# Patient Record
Sex: Female | Born: 1955 | Race: Black or African American | Hispanic: No | Marital: Married | Smoking: Former smoker
Health system: Southern US, Community
[De-identification: ages and names within clinical notes are randomized; demographics above are authoritative.]

## PROBLEM LIST (undated history)

## (undated) DIAGNOSIS — G40909 Epilepsy, unspecified, not intractable, without status epilepticus: Secondary | ICD-10-CM

## (undated) DIAGNOSIS — E669 Obesity, unspecified: Secondary | ICD-10-CM

## (undated) DIAGNOSIS — I10 Essential (primary) hypertension: Secondary | ICD-10-CM

## (undated) DIAGNOSIS — N269 Renal sclerosis, unspecified: Secondary | ICD-10-CM

## (undated) DIAGNOSIS — R7303 Prediabetes: Secondary | ICD-10-CM

## (undated) DIAGNOSIS — F329 Major depressive disorder, single episode, unspecified: Secondary | ICD-10-CM

## (undated) DIAGNOSIS — Z720 Tobacco use: Secondary | ICD-10-CM

## (undated) DIAGNOSIS — M2550 Pain in unspecified joint: Secondary | ICD-10-CM

## (undated) DIAGNOSIS — M1990 Unspecified osteoarthritis, unspecified site: Secondary | ICD-10-CM

## (undated) DIAGNOSIS — K219 Gastro-esophageal reflux disease without esophagitis: Secondary | ICD-10-CM

## (undated) DIAGNOSIS — F419 Anxiety disorder, unspecified: Secondary | ICD-10-CM

## (undated) DIAGNOSIS — K589 Irritable bowel syndrome without diarrhea: Secondary | ICD-10-CM

## (undated) DIAGNOSIS — D649 Anemia, unspecified: Secondary | ICD-10-CM

## (undated) DIAGNOSIS — F450 Somatization disorder: Secondary | ICD-10-CM

## (undated) DIAGNOSIS — R569 Unspecified convulsions: Secondary | ICD-10-CM

## (undated) DIAGNOSIS — R059 Cough, unspecified: Secondary | ICD-10-CM

## (undated) DIAGNOSIS — R002 Palpitations: Secondary | ICD-10-CM

## (undated) DIAGNOSIS — F32A Depression, unspecified: Secondary | ICD-10-CM

## (undated) DIAGNOSIS — J40 Bronchitis, not specified as acute or chronic: Secondary | ICD-10-CM

## (undated) DIAGNOSIS — K5900 Constipation, unspecified: Secondary | ICD-10-CM

## (undated) DIAGNOSIS — G8929 Other chronic pain: Secondary | ICD-10-CM

## (undated) DIAGNOSIS — E119 Type 2 diabetes mellitus without complications: Secondary | ICD-10-CM

## (undated) DIAGNOSIS — M069 Rheumatoid arthritis, unspecified: Secondary | ICD-10-CM

## (undated) DIAGNOSIS — T7840XA Allergy, unspecified, initial encounter: Secondary | ICD-10-CM

## (undated) DIAGNOSIS — R05 Cough: Secondary | ICD-10-CM

## (undated) DIAGNOSIS — E785 Hyperlipidemia, unspecified: Secondary | ICD-10-CM

## (undated) DIAGNOSIS — J189 Pneumonia, unspecified organism: Secondary | ICD-10-CM

## (undated) DIAGNOSIS — M549 Dorsalgia, unspecified: Secondary | ICD-10-CM

## (undated) HISTORY — DX: Tobacco use: Z72.0

## (undated) HISTORY — DX: Rheumatoid arthritis, unspecified: M06.9

## (undated) HISTORY — PX: TONSILLECTOMY: SUR1361

## (undated) HISTORY — DX: Gastro-esophageal reflux disease without esophagitis: K21.9

## (undated) HISTORY — PX: BREAST EXCISIONAL BIOPSY: SUR124

## (undated) HISTORY — DX: Dorsalgia, unspecified: M54.9

## (undated) HISTORY — DX: Obesity, unspecified: E66.9

## (undated) HISTORY — DX: Pain in unspecified joint: M25.50

## (undated) HISTORY — DX: Prediabetes: R73.03

## (undated) HISTORY — DX: Anxiety disorder, unspecified: F41.9

## (undated) HISTORY — DX: Palpitations: R00.2

## (undated) HISTORY — DX: Unspecified convulsions: R56.9

## (undated) HISTORY — DX: Somatization disorder: F45.0

## (undated) HISTORY — PX: TUBAL LIGATION: SHX77

## (undated) HISTORY — DX: Constipation, unspecified: K59.00

## (undated) HISTORY — DX: Unspecified osteoarthritis, unspecified site: M19.90

## (undated) HISTORY — DX: Epilepsy, unspecified, not intractable, without status epilepticus: G40.909

## (undated) HISTORY — DX: Hyperlipidemia, unspecified: E78.5

## (undated) HISTORY — DX: Allergy, unspecified, initial encounter: T78.40XA

## (undated) HISTORY — DX: Renal sclerosis, unspecified: N26.9

## (undated) HISTORY — DX: Other chronic pain: G89.29

## (undated) HISTORY — DX: Anemia, unspecified: D64.9

## (undated) HISTORY — DX: Major depressive disorder, single episode, unspecified: F32.9

## (undated) HISTORY — PX: ABDOMINAL HYSTERECTOMY: SHX81

## (undated) HISTORY — DX: Depression, unspecified: F32.A

## (undated) HISTORY — PX: COLONOSCOPY: SHX174

## (undated) HISTORY — DX: Essential (primary) hypertension: I10

## (undated) HISTORY — PX: CHOLECYSTECTOMY: SHX55

---
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
## 1997-10-02 ENCOUNTER — Encounter: Admission: RE | Admit: 1997-10-02 | Discharge: 1997-10-02 | Payer: Self-pay | Admitting: Family Medicine

## 1997-10-09 ENCOUNTER — Encounter: Admission: RE | Admit: 1997-10-09 | Discharge: 1997-10-09 | Payer: Self-pay | Admitting: Family Medicine

## 1997-10-23 ENCOUNTER — Encounter: Admission: RE | Admit: 1997-10-23 | Discharge: 1997-10-23 | Payer: Self-pay | Admitting: Family Medicine

## 1997-11-13 ENCOUNTER — Encounter: Admission: RE | Admit: 1997-11-13 | Discharge: 1997-11-13 | Payer: Self-pay | Admitting: Family Medicine

## 1997-11-14 ENCOUNTER — Encounter: Admission: RE | Admit: 1997-11-14 | Discharge: 1997-11-14 | Payer: Self-pay | Admitting: Family Medicine

## 1997-12-02 ENCOUNTER — Ambulatory Visit (HOSPITAL_COMMUNITY): Admission: RE | Admit: 1997-12-02 | Discharge: 1997-12-02 | Payer: Self-pay | Admitting: Family Medicine

## 1997-12-31 ENCOUNTER — Encounter: Admission: RE | Admit: 1997-12-31 | Discharge: 1997-12-31 | Payer: Self-pay | Admitting: Family Medicine

## 1998-01-02 ENCOUNTER — Ambulatory Visit (HOSPITAL_COMMUNITY): Admission: RE | Admit: 1998-01-02 | Discharge: 1998-01-02 | Payer: Self-pay | Admitting: Neurology

## 1998-01-15 ENCOUNTER — Encounter: Admission: RE | Admit: 1998-01-15 | Discharge: 1998-01-15 | Payer: Self-pay | Admitting: Family Medicine

## 1998-01-20 ENCOUNTER — Encounter: Admission: RE | Admit: 1998-01-20 | Discharge: 1998-01-20 | Payer: Self-pay | Admitting: Family Medicine

## 1998-02-05 ENCOUNTER — Encounter: Admission: RE | Admit: 1998-02-05 | Discharge: 1998-02-05 | Payer: Self-pay | Admitting: Family Medicine

## 1998-02-12 ENCOUNTER — Encounter: Admission: RE | Admit: 1998-02-12 | Discharge: 1998-02-12 | Payer: Self-pay | Admitting: Family Medicine

## 1998-02-17 ENCOUNTER — Encounter: Admission: RE | Admit: 1998-02-17 | Discharge: 1998-02-17 | Payer: Self-pay | Admitting: Sports Medicine

## 1998-04-06 ENCOUNTER — Encounter: Admission: RE | Admit: 1998-04-06 | Discharge: 1998-04-06 | Payer: Self-pay | Admitting: Family Medicine

## 1998-04-16 ENCOUNTER — Encounter: Admission: RE | Admit: 1998-04-16 | Discharge: 1998-04-16 | Payer: Self-pay | Admitting: Family Medicine

## 1998-05-26 ENCOUNTER — Encounter: Admission: RE | Admit: 1998-05-26 | Discharge: 1998-05-26 | Payer: Self-pay | Admitting: Family Medicine

## 1998-06-11 ENCOUNTER — Encounter: Admission: RE | Admit: 1998-06-11 | Discharge: 1998-06-11 | Payer: Self-pay | Admitting: Family Medicine

## 1998-06-19 ENCOUNTER — Other Ambulatory Visit: Admission: RE | Admit: 1998-06-19 | Discharge: 1998-06-19 | Payer: Self-pay | Admitting: Family Medicine

## 1998-06-19 ENCOUNTER — Encounter: Admission: RE | Admit: 1998-06-19 | Discharge: 1998-06-19 | Payer: Self-pay | Admitting: Family Medicine

## 1998-07-17 ENCOUNTER — Encounter: Admission: RE | Admit: 1998-07-17 | Discharge: 1998-07-17 | Payer: Self-pay | Admitting: Family Medicine

## 1998-08-27 ENCOUNTER — Encounter: Admission: RE | Admit: 1998-08-27 | Discharge: 1998-08-27 | Payer: Self-pay | Admitting: Family Medicine

## 1998-09-03 ENCOUNTER — Encounter: Admission: RE | Admit: 1998-09-03 | Discharge: 1998-09-03 | Payer: Self-pay | Admitting: Family Medicine

## 1998-09-10 ENCOUNTER — Encounter: Admission: RE | Admit: 1998-09-10 | Discharge: 1998-09-10 | Payer: Self-pay | Admitting: Family Medicine

## 1998-09-22 ENCOUNTER — Encounter: Admission: RE | Admit: 1998-09-22 | Discharge: 1998-09-22 | Payer: Self-pay | Admitting: Sports Medicine

## 1998-10-15 ENCOUNTER — Encounter: Admission: RE | Admit: 1998-10-15 | Discharge: 1998-10-15 | Payer: Self-pay | Admitting: Family Medicine

## 1998-10-21 ENCOUNTER — Encounter: Admission: RE | Admit: 1998-10-21 | Discharge: 1998-10-21 | Payer: Self-pay | Admitting: Family Medicine

## 1998-11-12 ENCOUNTER — Encounter: Admission: RE | Admit: 1998-11-12 | Discharge: 1998-11-12 | Payer: Self-pay | Admitting: Family Medicine

## 1998-11-16 ENCOUNTER — Encounter: Admission: RE | Admit: 1998-11-16 | Discharge: 1998-11-16 | Payer: Self-pay | Admitting: Family Medicine

## 1998-11-26 ENCOUNTER — Encounter: Admission: RE | Admit: 1998-11-26 | Discharge: 1998-11-26 | Payer: Self-pay | Admitting: Family Medicine

## 1998-12-09 ENCOUNTER — Encounter: Admission: RE | Admit: 1998-12-09 | Discharge: 1998-12-09 | Payer: Self-pay | Admitting: Family Medicine

## 1998-12-10 ENCOUNTER — Encounter: Admission: RE | Admit: 1998-12-10 | Discharge: 1998-12-10 | Payer: Self-pay | Admitting: Family Medicine

## 1998-12-11 ENCOUNTER — Encounter: Admission: RE | Admit: 1998-12-11 | Discharge: 1998-12-11 | Payer: Self-pay | Admitting: Family Medicine

## 1998-12-14 ENCOUNTER — Encounter: Admission: RE | Admit: 1998-12-14 | Discharge: 1998-12-14 | Payer: Self-pay | Admitting: Family Medicine

## 1998-12-16 ENCOUNTER — Encounter: Admission: RE | Admit: 1998-12-16 | Discharge: 1998-12-16 | Payer: Self-pay | Admitting: Family Medicine

## 1998-12-21 ENCOUNTER — Encounter: Admission: RE | Admit: 1998-12-21 | Discharge: 1998-12-21 | Payer: Self-pay | Admitting: Family Medicine

## 1998-12-23 ENCOUNTER — Encounter: Admission: RE | Admit: 1998-12-23 | Discharge: 1998-12-23 | Payer: Self-pay | Admitting: Family Medicine

## 1998-12-28 ENCOUNTER — Encounter: Admission: RE | Admit: 1998-12-28 | Discharge: 1998-12-28 | Payer: Self-pay | Admitting: Family Medicine

## 1999-01-08 ENCOUNTER — Encounter: Admission: RE | Admit: 1999-01-08 | Discharge: 1999-01-08 | Payer: Self-pay | Admitting: Family Medicine

## 1999-02-23 ENCOUNTER — Encounter: Admission: RE | Admit: 1999-02-23 | Discharge: 1999-02-23 | Payer: Self-pay | Admitting: Family Medicine

## 1999-03-02 ENCOUNTER — Encounter: Admission: RE | Admit: 1999-03-02 | Discharge: 1999-03-02 | Payer: Self-pay | Admitting: Family Medicine

## 1999-03-09 ENCOUNTER — Encounter: Admission: RE | Admit: 1999-03-09 | Discharge: 1999-03-09 | Payer: Self-pay | Admitting: Family Medicine

## 1999-03-15 ENCOUNTER — Encounter: Admission: RE | Admit: 1999-03-15 | Discharge: 1999-03-15 | Payer: Self-pay | Admitting: Family Medicine

## 1999-03-23 ENCOUNTER — Encounter: Admission: RE | Admit: 1999-03-23 | Discharge: 1999-03-23 | Payer: Self-pay | Admitting: Family Medicine

## 1999-03-27 ENCOUNTER — Emergency Department (HOSPITAL_COMMUNITY): Admission: EM | Admit: 1999-03-27 | Discharge: 1999-03-27 | Payer: Self-pay | Admitting: Emergency Medicine

## 1999-03-30 ENCOUNTER — Encounter: Admission: RE | Admit: 1999-03-30 | Discharge: 1999-03-30 | Payer: Self-pay | Admitting: Family Medicine

## 1999-04-22 ENCOUNTER — Encounter: Admission: RE | Admit: 1999-04-22 | Discharge: 1999-04-22 | Payer: Self-pay | Admitting: Family Medicine

## 1999-04-22 ENCOUNTER — Other Ambulatory Visit: Admission: RE | Admit: 1999-04-22 | Discharge: 1999-04-22 | Payer: Self-pay | Admitting: Obstetrics & Gynecology

## 1999-04-26 ENCOUNTER — Encounter: Admission: RE | Admit: 1999-04-26 | Discharge: 1999-04-26 | Payer: Self-pay | Admitting: Family Medicine

## 1999-05-05 ENCOUNTER — Encounter: Admission: RE | Admit: 1999-05-05 | Discharge: 1999-05-05 | Payer: Self-pay | Admitting: Family Medicine

## 1999-05-19 ENCOUNTER — Encounter: Admission: RE | Admit: 1999-05-19 | Discharge: 1999-05-19 | Payer: Self-pay | Admitting: Family Medicine

## 1999-05-28 ENCOUNTER — Encounter: Payer: Self-pay | Admitting: Sports Medicine

## 1999-05-28 ENCOUNTER — Encounter: Admission: RE | Admit: 1999-05-28 | Discharge: 1999-05-28 | Payer: Self-pay | Admitting: Sports Medicine

## 1999-08-03 ENCOUNTER — Encounter (INDEPENDENT_AMBULATORY_CARE_PROVIDER_SITE_OTHER): Payer: Self-pay | Admitting: *Deleted

## 1999-08-03 ENCOUNTER — Ambulatory Visit (HOSPITAL_COMMUNITY): Admission: RE | Admit: 1999-08-03 | Discharge: 1999-08-03 | Payer: Self-pay | Admitting: Gastroenterology

## 1999-09-07 ENCOUNTER — Encounter: Admission: RE | Admit: 1999-09-07 | Discharge: 1999-09-07 | Payer: Self-pay | Admitting: Sports Medicine

## 1999-11-08 ENCOUNTER — Encounter: Admission: RE | Admit: 1999-11-08 | Discharge: 1999-11-08 | Payer: Self-pay | Admitting: Family Medicine

## 2000-01-03 ENCOUNTER — Encounter: Admission: RE | Admit: 2000-01-03 | Discharge: 2000-01-03 | Payer: Self-pay | Admitting: Family Medicine

## 2000-01-10 ENCOUNTER — Encounter: Admission: RE | Admit: 2000-01-10 | Discharge: 2000-01-10 | Payer: Self-pay | Admitting: Family Medicine

## 2000-02-08 ENCOUNTER — Encounter: Admission: RE | Admit: 2000-02-08 | Discharge: 2000-02-08 | Payer: Self-pay | Admitting: Family Medicine

## 2000-02-10 ENCOUNTER — Encounter: Payer: Self-pay | Admitting: Gastroenterology

## 2000-02-10 ENCOUNTER — Ambulatory Visit (HOSPITAL_COMMUNITY): Admission: RE | Admit: 2000-02-10 | Discharge: 2000-02-10 | Payer: Self-pay | Admitting: Gastroenterology

## 2000-02-28 ENCOUNTER — Encounter: Admission: RE | Admit: 2000-02-28 | Discharge: 2000-02-28 | Payer: Self-pay | Admitting: Sports Medicine

## 2000-03-09 ENCOUNTER — Encounter: Admission: RE | Admit: 2000-03-09 | Discharge: 2000-03-09 | Payer: Self-pay | Admitting: Family Medicine

## 2000-03-21 ENCOUNTER — Encounter: Admission: RE | Admit: 2000-03-21 | Discharge: 2000-03-21 | Payer: Self-pay | Admitting: Family Medicine

## 2000-03-24 ENCOUNTER — Encounter: Payer: Self-pay | Admitting: Gastroenterology

## 2000-03-24 ENCOUNTER — Ambulatory Visit (HOSPITAL_COMMUNITY): Admission: RE | Admit: 2000-03-24 | Discharge: 2000-03-24 | Payer: Self-pay | Admitting: Gastroenterology

## 2000-05-08 ENCOUNTER — Encounter: Admission: RE | Admit: 2000-05-08 | Discharge: 2000-05-08 | Payer: Self-pay | Admitting: Family Medicine

## 2000-05-17 ENCOUNTER — Encounter: Admission: RE | Admit: 2000-05-17 | Discharge: 2000-05-17 | Payer: Self-pay | Admitting: Family Medicine

## 2000-05-29 ENCOUNTER — Encounter: Admission: RE | Admit: 2000-05-29 | Discharge: 2000-05-29 | Payer: Self-pay | Admitting: *Deleted

## 2000-05-29 ENCOUNTER — Encounter: Admission: RE | Admit: 2000-05-29 | Discharge: 2000-05-29 | Payer: Self-pay | Admitting: Family Medicine

## 2000-05-30 ENCOUNTER — Encounter: Admission: RE | Admit: 2000-05-30 | Discharge: 2000-05-30 | Payer: Self-pay | Admitting: Family Medicine

## 2000-05-30 ENCOUNTER — Other Ambulatory Visit: Admission: RE | Admit: 2000-05-30 | Discharge: 2000-05-30 | Payer: Self-pay | Admitting: Family Medicine

## 2000-05-31 ENCOUNTER — Encounter: Payer: Self-pay | Admitting: Emergency Medicine

## 2000-05-31 ENCOUNTER — Inpatient Hospital Stay (HOSPITAL_COMMUNITY): Admission: EM | Admit: 2000-05-31 | Discharge: 2000-06-01 | Payer: Self-pay | Admitting: Emergency Medicine

## 2000-06-21 ENCOUNTER — Encounter: Admission: RE | Admit: 2000-06-21 | Discharge: 2000-06-21 | Payer: Self-pay | Admitting: Family Medicine

## 2000-06-29 ENCOUNTER — Encounter: Admission: RE | Admit: 2000-06-29 | Discharge: 2000-06-29 | Payer: Self-pay | Admitting: Family Medicine

## 2000-07-04 HISTORY — PX: OTHER SURGICAL HISTORY: SHX169

## 2000-07-12 ENCOUNTER — Encounter: Admission: RE | Admit: 2000-07-12 | Discharge: 2000-07-12 | Payer: Self-pay | Admitting: Family Medicine

## 2000-08-08 ENCOUNTER — Encounter: Admission: RE | Admit: 2000-08-08 | Discharge: 2000-08-08 | Payer: Self-pay | Admitting: Sports Medicine

## 2000-08-22 ENCOUNTER — Encounter: Admission: RE | Admit: 2000-08-22 | Discharge: 2000-08-22 | Payer: Self-pay | Admitting: Sports Medicine

## 2000-09-05 ENCOUNTER — Encounter: Admission: RE | Admit: 2000-09-05 | Discharge: 2000-09-05 | Payer: Self-pay | Admitting: Family Medicine

## 2000-09-20 ENCOUNTER — Encounter: Admission: RE | Admit: 2000-09-20 | Discharge: 2000-09-20 | Payer: Self-pay | Admitting: Family Medicine

## 2000-11-30 ENCOUNTER — Encounter: Admission: RE | Admit: 2000-11-30 | Discharge: 2000-11-30 | Payer: Self-pay | Admitting: Family Medicine

## 2000-12-08 ENCOUNTER — Encounter: Admission: RE | Admit: 2000-12-08 | Discharge: 2000-12-08 | Payer: Self-pay | Admitting: Family Medicine

## 2001-01-09 ENCOUNTER — Encounter: Admission: RE | Admit: 2001-01-09 | Discharge: 2001-01-09 | Payer: Self-pay | Admitting: Sports Medicine

## 2001-01-30 ENCOUNTER — Encounter: Admission: RE | Admit: 2001-01-30 | Discharge: 2001-01-30 | Payer: Self-pay | Admitting: Family Medicine

## 2001-03-26 ENCOUNTER — Encounter: Admission: RE | Admit: 2001-03-26 | Discharge: 2001-03-26 | Payer: Self-pay | Admitting: *Deleted

## 2001-05-10 ENCOUNTER — Encounter: Admission: RE | Admit: 2001-05-10 | Discharge: 2001-05-10 | Payer: Self-pay | Admitting: Family Medicine

## 2001-06-04 ENCOUNTER — Encounter: Admission: RE | Admit: 2001-06-04 | Discharge: 2001-06-04 | Payer: Self-pay | Admitting: Internal Medicine

## 2001-06-04 ENCOUNTER — Encounter: Payer: Self-pay | Admitting: Internal Medicine

## 2001-06-14 ENCOUNTER — Ambulatory Visit (HOSPITAL_COMMUNITY): Admission: RE | Admit: 2001-06-14 | Discharge: 2001-06-14 | Payer: Self-pay | Admitting: Internal Medicine

## 2001-06-14 ENCOUNTER — Encounter: Payer: Self-pay | Admitting: Internal Medicine

## 2001-09-03 ENCOUNTER — Encounter: Payer: Self-pay | Admitting: Urology

## 2001-09-03 ENCOUNTER — Encounter: Admission: RE | Admit: 2001-09-03 | Discharge: 2001-09-03 | Payer: Self-pay | Admitting: Urology

## 2001-09-11 ENCOUNTER — Encounter: Admission: RE | Admit: 2001-09-11 | Discharge: 2001-09-11 | Payer: Self-pay | Admitting: Urology

## 2001-09-11 ENCOUNTER — Encounter: Payer: Self-pay | Admitting: Urology

## 2001-09-26 ENCOUNTER — Other Ambulatory Visit: Admission: RE | Admit: 2001-09-26 | Discharge: 2001-09-26 | Payer: Self-pay | Admitting: Obstetrics and Gynecology

## 2001-11-07 ENCOUNTER — Encounter (INDEPENDENT_AMBULATORY_CARE_PROVIDER_SITE_OTHER): Payer: Self-pay | Admitting: Specialist

## 2001-11-07 ENCOUNTER — Encounter: Payer: Self-pay | Admitting: Urology

## 2001-11-07 ENCOUNTER — Inpatient Hospital Stay (HOSPITAL_COMMUNITY): Admission: RE | Admit: 2001-11-07 | Discharge: 2001-11-10 | Payer: Self-pay | Admitting: Obstetrics and Gynecology

## 2002-02-05 ENCOUNTER — Encounter: Payer: Self-pay | Admitting: Urology

## 2002-02-05 ENCOUNTER — Encounter: Admission: RE | Admit: 2002-02-05 | Discharge: 2002-02-05 | Payer: Self-pay | Admitting: Urology

## 2002-02-08 ENCOUNTER — Ambulatory Visit (HOSPITAL_BASED_OUTPATIENT_CLINIC_OR_DEPARTMENT_OTHER): Admission: RE | Admit: 2002-02-08 | Discharge: 2002-02-08 | Payer: Self-pay | Admitting: Urology

## 2002-03-04 ENCOUNTER — Encounter (INDEPENDENT_AMBULATORY_CARE_PROVIDER_SITE_OTHER): Payer: Self-pay | Admitting: *Deleted

## 2002-03-04 LAB — CONVERTED CEMR LAB

## 2002-03-19 ENCOUNTER — Encounter: Admission: RE | Admit: 2002-03-19 | Discharge: 2002-03-19 | Payer: Self-pay | Admitting: Family Medicine

## 2002-03-20 ENCOUNTER — Encounter: Admission: RE | Admit: 2002-03-20 | Discharge: 2002-03-20 | Payer: Self-pay | Admitting: Family Medicine

## 2002-03-20 ENCOUNTER — Other Ambulatory Visit: Admission: RE | Admit: 2002-03-20 | Discharge: 2002-03-20 | Payer: Self-pay | Admitting: Family Medicine

## 2002-04-06 ENCOUNTER — Emergency Department (HOSPITAL_COMMUNITY): Admission: EM | Admit: 2002-04-06 | Discharge: 2002-04-06 | Payer: Self-pay | Admitting: *Deleted

## 2002-04-08 ENCOUNTER — Ambulatory Visit (HOSPITAL_COMMUNITY): Admission: RE | Admit: 2002-04-08 | Discharge: 2002-04-08 | Payer: Self-pay | Admitting: Urology

## 2002-04-08 ENCOUNTER — Encounter: Payer: Self-pay | Admitting: Urology

## 2002-04-11 ENCOUNTER — Encounter: Admission: RE | Admit: 2002-04-11 | Discharge: 2002-04-11 | Payer: Self-pay | Admitting: Family Medicine

## 2002-04-16 ENCOUNTER — Emergency Department (HOSPITAL_COMMUNITY): Admission: EM | Admit: 2002-04-16 | Discharge: 2002-04-16 | Payer: Self-pay | Admitting: Emergency Medicine

## 2002-04-16 ENCOUNTER — Encounter: Admission: RE | Admit: 2002-04-16 | Discharge: 2002-04-16 | Payer: Self-pay | Admitting: Sports Medicine

## 2002-04-29 ENCOUNTER — Ambulatory Visit (HOSPITAL_BASED_OUTPATIENT_CLINIC_OR_DEPARTMENT_OTHER): Admission: RE | Admit: 2002-04-29 | Discharge: 2002-04-29 | Payer: Self-pay | Admitting: Orthopedic Surgery

## 2002-05-20 ENCOUNTER — Ambulatory Visit (HOSPITAL_BASED_OUTPATIENT_CLINIC_OR_DEPARTMENT_OTHER): Admission: RE | Admit: 2002-05-20 | Discharge: 2002-05-20 | Payer: Self-pay | Admitting: Orthopedic Surgery

## 2002-06-06 ENCOUNTER — Encounter: Payer: Self-pay | Admitting: Sports Medicine

## 2002-06-06 ENCOUNTER — Encounter: Admission: RE | Admit: 2002-06-06 | Discharge: 2002-06-06 | Payer: Self-pay | Admitting: Sports Medicine

## 2002-06-12 ENCOUNTER — Ambulatory Visit (HOSPITAL_COMMUNITY): Admission: RE | Admit: 2002-06-12 | Discharge: 2002-06-12 | Payer: Self-pay | Admitting: Family Medicine

## 2002-06-12 ENCOUNTER — Inpatient Hospital Stay (HOSPITAL_COMMUNITY): Admission: EM | Admit: 2002-06-12 | Discharge: 2002-06-13 | Payer: Self-pay | Admitting: Emergency Medicine

## 2002-06-12 ENCOUNTER — Encounter: Payer: Self-pay | Admitting: Emergency Medicine

## 2002-06-12 ENCOUNTER — Encounter: Admission: RE | Admit: 2002-06-12 | Discharge: 2002-06-12 | Payer: Self-pay | Admitting: Family Medicine

## 2002-07-13 ENCOUNTER — Emergency Department (HOSPITAL_COMMUNITY): Admission: EM | Admit: 2002-07-13 | Discharge: 2002-07-13 | Payer: Self-pay | Admitting: Emergency Medicine

## 2002-07-15 ENCOUNTER — Encounter: Admission: RE | Admit: 2002-07-15 | Discharge: 2002-08-22 | Payer: Self-pay | Admitting: Orthopedic Surgery

## 2002-09-05 ENCOUNTER — Encounter: Payer: Self-pay | Admitting: Gastroenterology

## 2002-09-05 ENCOUNTER — Ambulatory Visit (HOSPITAL_COMMUNITY): Admission: RE | Admit: 2002-09-05 | Discharge: 2002-09-05 | Payer: Self-pay | Admitting: Gastroenterology

## 2002-09-09 ENCOUNTER — Encounter: Admission: RE | Admit: 2002-09-09 | Discharge: 2002-09-09 | Payer: Self-pay | Admitting: Family Medicine

## 2002-09-17 ENCOUNTER — Encounter: Admission: RE | Admit: 2002-09-17 | Discharge: 2002-09-17 | Payer: Self-pay | Admitting: Sports Medicine

## 2002-09-17 ENCOUNTER — Encounter: Payer: Self-pay | Admitting: Sports Medicine

## 2002-09-17 ENCOUNTER — Encounter: Admission: RE | Admit: 2002-09-17 | Discharge: 2002-09-17 | Payer: Self-pay | Admitting: Family Medicine

## 2002-09-23 ENCOUNTER — Encounter: Admission: RE | Admit: 2002-09-23 | Discharge: 2002-09-23 | Payer: Self-pay | Admitting: Family Medicine

## 2002-09-26 ENCOUNTER — Encounter: Admission: RE | Admit: 2002-09-26 | Discharge: 2002-09-26 | Payer: Self-pay | Admitting: Family Medicine

## 2002-10-01 ENCOUNTER — Encounter: Admission: RE | Admit: 2002-10-01 | Discharge: 2002-12-04 | Payer: Self-pay

## 2002-10-09 ENCOUNTER — Encounter: Admission: RE | Admit: 2002-10-09 | Discharge: 2002-10-09 | Payer: Self-pay | Admitting: Family Medicine

## 2002-10-10 ENCOUNTER — Encounter: Payer: Self-pay | Admitting: Sports Medicine

## 2002-10-10 ENCOUNTER — Encounter: Admission: RE | Admit: 2002-10-10 | Discharge: 2002-10-10 | Payer: Self-pay | Admitting: Sports Medicine

## 2002-10-14 ENCOUNTER — Encounter: Admission: RE | Admit: 2002-10-14 | Discharge: 2002-10-14 | Payer: Self-pay | Admitting: Family Medicine

## 2002-11-04 ENCOUNTER — Encounter: Admission: RE | Admit: 2002-11-04 | Discharge: 2002-11-04 | Payer: Self-pay | Admitting: Family Medicine

## 2002-11-07 ENCOUNTER — Ambulatory Visit (HOSPITAL_COMMUNITY): Admission: RE | Admit: 2002-11-07 | Discharge: 2002-11-07 | Payer: Self-pay | Admitting: Cardiovascular Disease

## 2002-11-07 ENCOUNTER — Encounter: Payer: Self-pay | Admitting: Cardiovascular Disease

## 2002-11-13 ENCOUNTER — Encounter: Admission: RE | Admit: 2002-11-13 | Discharge: 2002-11-13 | Payer: Self-pay | Admitting: Family Medicine

## 2002-11-15 ENCOUNTER — Encounter: Payer: Self-pay | Admitting: Sports Medicine

## 2002-11-15 ENCOUNTER — Encounter: Admission: RE | Admit: 2002-11-15 | Discharge: 2002-11-15 | Payer: Self-pay | Admitting: Sports Medicine

## 2002-11-22 ENCOUNTER — Encounter: Admission: RE | Admit: 2002-11-22 | Discharge: 2002-11-22 | Payer: Self-pay | Admitting: Family Medicine

## 2003-02-06 ENCOUNTER — Encounter: Payer: Self-pay | Admitting: Emergency Medicine

## 2003-02-07 ENCOUNTER — Encounter: Payer: Self-pay | Admitting: *Deleted

## 2003-02-07 ENCOUNTER — Observation Stay (HOSPITAL_COMMUNITY): Admission: EM | Admit: 2003-02-07 | Discharge: 2003-02-07 | Payer: Self-pay | Admitting: *Deleted

## 2003-02-24 ENCOUNTER — Encounter: Admission: RE | Admit: 2003-02-24 | Discharge: 2003-02-24 | Payer: Self-pay | Admitting: Sports Medicine

## 2003-03-07 ENCOUNTER — Encounter: Payer: Self-pay | Admitting: Neurology

## 2003-03-07 ENCOUNTER — Ambulatory Visit (HOSPITAL_COMMUNITY): Admission: RE | Admit: 2003-03-07 | Discharge: 2003-03-07 | Payer: Self-pay | Admitting: Neurology

## 2003-03-18 ENCOUNTER — Encounter: Admission: RE | Admit: 2003-03-18 | Discharge: 2003-03-18 | Payer: Self-pay | Admitting: Gastroenterology

## 2003-03-18 ENCOUNTER — Encounter: Payer: Self-pay | Admitting: Gastroenterology

## 2003-05-19 ENCOUNTER — Encounter: Admission: RE | Admit: 2003-05-19 | Discharge: 2003-05-19 | Payer: Self-pay | Admitting: Family Medicine

## 2003-06-11 ENCOUNTER — Encounter: Admission: RE | Admit: 2003-06-11 | Discharge: 2003-06-11 | Payer: Self-pay | Admitting: Family Medicine

## 2003-06-13 ENCOUNTER — Encounter: Admission: RE | Admit: 2003-06-13 | Discharge: 2003-06-13 | Payer: Self-pay | Admitting: Family Medicine

## 2003-06-20 ENCOUNTER — Encounter: Admission: RE | Admit: 2003-06-20 | Discharge: 2003-06-20 | Payer: Self-pay | Admitting: Sports Medicine

## 2003-06-24 ENCOUNTER — Encounter: Admission: RE | Admit: 2003-06-24 | Discharge: 2003-06-24 | Payer: Self-pay | Admitting: Family Medicine

## 2003-06-30 ENCOUNTER — Encounter: Admission: RE | Admit: 2003-06-30 | Discharge: 2003-06-30 | Payer: Self-pay | Admitting: Family Medicine

## 2003-06-30 ENCOUNTER — Encounter: Admission: RE | Admit: 2003-06-30 | Discharge: 2003-06-30 | Payer: Self-pay | Admitting: Sports Medicine

## 2003-07-18 ENCOUNTER — Encounter: Admission: RE | Admit: 2003-07-18 | Discharge: 2003-07-18 | Payer: Self-pay | Admitting: Family Medicine

## 2003-07-29 ENCOUNTER — Encounter: Admission: RE | Admit: 2003-07-29 | Discharge: 2003-07-29 | Payer: Self-pay | Admitting: Family Medicine

## 2003-07-31 ENCOUNTER — Ambulatory Visit (HOSPITAL_COMMUNITY): Admission: RE | Admit: 2003-07-31 | Discharge: 2003-07-31 | Payer: Self-pay | Admitting: Family Medicine

## 2003-08-26 ENCOUNTER — Encounter: Admission: RE | Admit: 2003-08-26 | Discharge: 2003-08-26 | Payer: Self-pay | Admitting: Family Medicine

## 2003-09-11 ENCOUNTER — Encounter: Admission: RE | Admit: 2003-09-11 | Discharge: 2003-09-11 | Payer: Self-pay | Admitting: Family Medicine

## 2003-09-25 ENCOUNTER — Encounter: Admission: RE | Admit: 2003-09-25 | Discharge: 2003-09-25 | Payer: Self-pay | Admitting: Sports Medicine

## 2003-09-30 ENCOUNTER — Ambulatory Visit (HOSPITAL_COMMUNITY): Admission: RE | Admit: 2003-09-30 | Discharge: 2003-09-30 | Payer: Self-pay | Admitting: Sports Medicine

## 2003-10-08 ENCOUNTER — Encounter: Admission: RE | Admit: 2003-10-08 | Discharge: 2003-10-08 | Payer: Self-pay | Admitting: Family Medicine

## 2004-12-31 ENCOUNTER — Ambulatory Visit: Payer: Self-pay | Admitting: Sports Medicine

## 2005-01-18 ENCOUNTER — Ambulatory Visit: Payer: Self-pay | Admitting: Family Medicine

## 2005-03-06 ENCOUNTER — Emergency Department (HOSPITAL_COMMUNITY): Admission: EM | Admit: 2005-03-06 | Discharge: 2005-03-06 | Payer: Self-pay | Admitting: Family Medicine

## 2005-03-17 ENCOUNTER — Ambulatory Visit: Payer: Self-pay | Admitting: Family Medicine

## 2005-03-18 ENCOUNTER — Ambulatory Visit (HOSPITAL_COMMUNITY): Admission: RE | Admit: 2005-03-18 | Discharge: 2005-03-18 | Payer: Self-pay | Admitting: Family Medicine

## 2005-03-23 ENCOUNTER — Encounter: Admission: RE | Admit: 2005-03-23 | Discharge: 2005-04-22 | Payer: Self-pay | Admitting: Family Medicine

## 2005-03-29 ENCOUNTER — Ambulatory Visit: Payer: Self-pay | Admitting: Sports Medicine

## 2005-04-14 ENCOUNTER — Ambulatory Visit (HOSPITAL_COMMUNITY): Admission: RE | Admit: 2005-04-14 | Discharge: 2005-04-14 | Payer: Self-pay | Admitting: Internal Medicine

## 2005-04-15 ENCOUNTER — Ambulatory Visit: Payer: Self-pay | Admitting: Family Medicine

## 2005-05-16 ENCOUNTER — Ambulatory Visit: Payer: Self-pay | Admitting: Family Medicine

## 2005-05-27 ENCOUNTER — Emergency Department (HOSPITAL_COMMUNITY): Admission: EM | Admit: 2005-05-27 | Discharge: 2005-05-28 | Payer: Self-pay | Admitting: Emergency Medicine

## 2005-05-30 ENCOUNTER — Ambulatory Visit: Payer: Self-pay | Admitting: Sports Medicine

## 2005-06-03 ENCOUNTER — Ambulatory Visit: Payer: Self-pay | Admitting: Family Medicine

## 2005-07-21 ENCOUNTER — Ambulatory Visit: Payer: Self-pay | Admitting: Family Medicine

## 2005-08-11 ENCOUNTER — Ambulatory Visit: Payer: Self-pay | Admitting: Family Medicine

## 2005-08-11 ENCOUNTER — Ambulatory Visit (HOSPITAL_COMMUNITY): Admission: RE | Admit: 2005-08-11 | Discharge: 2005-08-11 | Payer: Self-pay | Admitting: Family Medicine

## 2005-09-06 ENCOUNTER — Ambulatory Visit: Payer: Self-pay | Admitting: Family Medicine

## 2005-09-30 ENCOUNTER — Encounter: Payer: Self-pay | Admitting: Vascular Surgery

## 2005-09-30 ENCOUNTER — Ambulatory Visit (HOSPITAL_COMMUNITY): Admission: RE | Admit: 2005-09-30 | Discharge: 2005-09-30 | Payer: Self-pay | Admitting: Cardiovascular Disease

## 2005-10-10 ENCOUNTER — Ambulatory Visit (HOSPITAL_COMMUNITY): Admission: RE | Admit: 2005-10-10 | Discharge: 2005-10-10 | Payer: Self-pay | Admitting: Cardiovascular Disease

## 2005-10-10 ENCOUNTER — Encounter: Payer: Self-pay | Admitting: Vascular Surgery

## 2005-10-23 ENCOUNTER — Emergency Department (HOSPITAL_COMMUNITY): Admission: AD | Admit: 2005-10-23 | Discharge: 2005-10-23 | Payer: Self-pay | Admitting: Emergency Medicine

## 2005-11-14 ENCOUNTER — Emergency Department (HOSPITAL_COMMUNITY): Admission: EM | Admit: 2005-11-14 | Discharge: 2005-11-14 | Payer: Self-pay | Admitting: Emergency Medicine

## 2005-11-21 ENCOUNTER — Ambulatory Visit: Payer: Self-pay | Admitting: Sports Medicine

## 2006-02-28 ENCOUNTER — Ambulatory Visit: Payer: Self-pay | Admitting: Sports Medicine

## 2006-03-15 ENCOUNTER — Ambulatory Visit: Payer: Self-pay | Admitting: Sports Medicine

## 2006-03-23 ENCOUNTER — Encounter: Admission: RE | Admit: 2006-03-23 | Discharge: 2006-03-23 | Payer: Self-pay | Admitting: Sports Medicine

## 2006-04-11 ENCOUNTER — Ambulatory Visit: Payer: Self-pay | Admitting: Gastroenterology

## 2006-04-17 ENCOUNTER — Ambulatory Visit (HOSPITAL_COMMUNITY): Admission: RE | Admit: 2006-04-17 | Discharge: 2006-04-17 | Payer: Self-pay | Admitting: Sports Medicine

## 2006-04-18 ENCOUNTER — Ambulatory Visit: Payer: Self-pay | Admitting: Gastroenterology

## 2006-04-18 ENCOUNTER — Encounter: Payer: Self-pay | Admitting: Gastroenterology

## 2006-05-23 ENCOUNTER — Ambulatory Visit: Payer: Self-pay | Admitting: Family Medicine

## 2006-05-31 ENCOUNTER — Emergency Department (HOSPITAL_COMMUNITY): Admission: EM | Admit: 2006-05-31 | Discharge: 2006-05-31 | Payer: Self-pay | Admitting: Emergency Medicine

## 2006-06-16 ENCOUNTER — Ambulatory Visit: Payer: Self-pay | Admitting: Family Medicine

## 2006-07-06 ENCOUNTER — Ambulatory Visit: Payer: Self-pay | Admitting: Family Medicine

## 2006-08-22 ENCOUNTER — Ambulatory Visit: Payer: Self-pay | Admitting: Family Medicine

## 2006-08-31 DIAGNOSIS — I10 Essential (primary) hypertension: Secondary | ICD-10-CM

## 2006-08-31 DIAGNOSIS — F339 Major depressive disorder, recurrent, unspecified: Secondary | ICD-10-CM | POA: Insufficient documentation

## 2006-08-31 DIAGNOSIS — Z87891 Personal history of nicotine dependence: Secondary | ICD-10-CM

## 2006-08-31 DIAGNOSIS — K273 Acute peptic ulcer, site unspecified, without hemorrhage or perforation: Secondary | ICD-10-CM | POA: Insufficient documentation

## 2006-08-31 DIAGNOSIS — E785 Hyperlipidemia, unspecified: Secondary | ICD-10-CM

## 2006-08-31 DIAGNOSIS — M545 Low back pain: Secondary | ICD-10-CM

## 2006-08-31 DIAGNOSIS — F450 Somatization disorder: Secondary | ICD-10-CM | POA: Insufficient documentation

## 2006-08-31 DIAGNOSIS — R569 Unspecified convulsions: Secondary | ICD-10-CM | POA: Insufficient documentation

## 2006-08-31 DIAGNOSIS — G5600 Carpal tunnel syndrome, unspecified upper limb: Secondary | ICD-10-CM

## 2006-09-01 ENCOUNTER — Encounter (INDEPENDENT_AMBULATORY_CARE_PROVIDER_SITE_OTHER): Payer: Self-pay | Admitting: *Deleted

## 2006-09-29 ENCOUNTER — Emergency Department (HOSPITAL_COMMUNITY): Admission: EM | Admit: 2006-09-29 | Discharge: 2006-09-29 | Payer: Self-pay | Admitting: Emergency Medicine

## 2006-10-04 ENCOUNTER — Encounter: Payer: Self-pay | Admitting: Family Medicine

## 2006-10-04 ENCOUNTER — Telehealth: Payer: Self-pay | Admitting: *Deleted

## 2006-10-04 ENCOUNTER — Ambulatory Visit: Payer: Self-pay | Admitting: Family Medicine

## 2006-10-12 ENCOUNTER — Telehealth: Payer: Self-pay | Admitting: *Deleted

## 2006-10-13 ENCOUNTER — Ambulatory Visit: Payer: Self-pay | Admitting: Family Medicine

## 2006-10-13 DIAGNOSIS — L259 Unspecified contact dermatitis, unspecified cause: Secondary | ICD-10-CM

## 2006-10-31 ENCOUNTER — Telehealth: Payer: Self-pay | Admitting: *Deleted

## 2006-11-03 ENCOUNTER — Ambulatory Visit: Payer: Self-pay | Admitting: Family Medicine

## 2006-11-03 ENCOUNTER — Encounter (INDEPENDENT_AMBULATORY_CARE_PROVIDER_SITE_OTHER): Payer: Self-pay | Admitting: Family Medicine

## 2006-11-03 LAB — CONVERTED CEMR LAB
ALT: 22 units/L (ref 0–35)
AST: 20 units/L (ref 0–37)
CO2: 31 meq/L (ref 19–32)
Carbamazepine Lvl: 6.7 ug/mL (ref 4.0–12.0)
Chloride: 100 meq/L (ref 96–112)
Sodium: 139 meq/L (ref 135–145)
Total Bilirubin: 0.3 mg/dL (ref 0.3–1.2)
Total Protein: 7 g/dL (ref 6.0–8.3)

## 2006-11-06 ENCOUNTER — Telehealth (INDEPENDENT_AMBULATORY_CARE_PROVIDER_SITE_OTHER): Payer: Self-pay | Admitting: Family Medicine

## 2006-11-07 ENCOUNTER — Telehealth (INDEPENDENT_AMBULATORY_CARE_PROVIDER_SITE_OTHER): Payer: Self-pay | Admitting: Family Medicine

## 2006-11-10 ENCOUNTER — Telehealth: Payer: Self-pay | Admitting: *Deleted

## 2006-11-13 ENCOUNTER — Telehealth: Payer: Self-pay | Admitting: *Deleted

## 2006-11-29 ENCOUNTER — Telehealth (INDEPENDENT_AMBULATORY_CARE_PROVIDER_SITE_OTHER): Payer: Self-pay | Admitting: Family Medicine

## 2006-12-01 ENCOUNTER — Telehealth: Payer: Self-pay | Admitting: *Deleted

## 2006-12-04 ENCOUNTER — Ambulatory Visit: Payer: Self-pay | Admitting: Gastroenterology

## 2006-12-08 ENCOUNTER — Ambulatory Visit (HOSPITAL_COMMUNITY): Admission: RE | Admit: 2006-12-08 | Discharge: 2006-12-08 | Payer: Self-pay | Admitting: Gastroenterology

## 2007-01-16 ENCOUNTER — Telehealth: Payer: Self-pay | Admitting: *Deleted

## 2007-01-18 ENCOUNTER — Ambulatory Visit: Payer: Self-pay | Admitting: Family Medicine

## 2007-01-18 DIAGNOSIS — M5126 Other intervertebral disc displacement, lumbar region: Secondary | ICD-10-CM

## 2007-01-25 ENCOUNTER — Ambulatory Visit: Payer: Self-pay | Admitting: Family Medicine

## 2007-01-26 ENCOUNTER — Encounter: Admission: RE | Admit: 2007-01-26 | Discharge: 2007-01-26 | Payer: Self-pay | Admitting: Sports Medicine

## 2007-01-29 ENCOUNTER — Telehealth: Payer: Self-pay | Admitting: Family Medicine

## 2007-03-27 ENCOUNTER — Telehealth: Payer: Self-pay | Admitting: *Deleted

## 2007-03-28 ENCOUNTER — Telehealth (INDEPENDENT_AMBULATORY_CARE_PROVIDER_SITE_OTHER): Payer: Self-pay | Admitting: *Deleted

## 2007-03-28 ENCOUNTER — Ambulatory Visit: Payer: Self-pay | Admitting: Family Medicine

## 2007-04-03 ENCOUNTER — Ambulatory Visit: Payer: Self-pay | Admitting: Sports Medicine

## 2007-04-03 DIAGNOSIS — M722 Plantar fascial fibromatosis: Secondary | ICD-10-CM

## 2007-04-10 ENCOUNTER — Encounter: Admission: RE | Admit: 2007-04-10 | Discharge: 2007-05-16 | Payer: Self-pay | Admitting: Family Medicine

## 2007-04-20 ENCOUNTER — Telehealth: Payer: Self-pay | Admitting: *Deleted

## 2007-04-24 ENCOUNTER — Ambulatory Visit: Payer: Self-pay | Admitting: Family Medicine

## 2007-05-03 ENCOUNTER — Ambulatory Visit: Payer: Self-pay | Admitting: Family Medicine

## 2007-05-15 ENCOUNTER — Encounter (INDEPENDENT_AMBULATORY_CARE_PROVIDER_SITE_OTHER): Payer: Self-pay | Admitting: *Deleted

## 2007-05-17 ENCOUNTER — Telehealth: Payer: Self-pay | Admitting: Family Medicine

## 2007-05-25 ENCOUNTER — Ambulatory Visit: Payer: Self-pay | Admitting: Family Medicine

## 2007-05-25 DIAGNOSIS — K219 Gastro-esophageal reflux disease without esophagitis: Secondary | ICD-10-CM

## 2007-05-30 ENCOUNTER — Telehealth (INDEPENDENT_AMBULATORY_CARE_PROVIDER_SITE_OTHER): Payer: Self-pay | Admitting: *Deleted

## 2007-06-06 ENCOUNTER — Ambulatory Visit: Payer: Self-pay | Admitting: Family Medicine

## 2007-06-11 ENCOUNTER — Telehealth: Payer: Self-pay | Admitting: *Deleted

## 2007-06-14 ENCOUNTER — Encounter: Payer: Self-pay | Admitting: Family Medicine

## 2007-06-14 ENCOUNTER — Ambulatory Visit: Payer: Self-pay | Admitting: Family Medicine

## 2007-06-15 LAB — CONVERTED CEMR LAB
Alkaline Phosphatase: 147 units/L — ABNORMAL HIGH (ref 39–117)
Basophils Relative: 1 % (ref 0–1)
Bilirubin, Direct: 0.1 mg/dL (ref 0.0–0.3)
Eosinophils Absolute: 0 10*3/uL — ABNORMAL LOW (ref 0.2–0.7)
Eosinophils Relative: 0 % (ref 0–5)
HCT: 39.8 % (ref 36.0–46.0)
Indirect Bilirubin: 0.2 mg/dL (ref 0.0–0.9)
LDL Cholesterol: 96 mg/dL (ref 0–99)
MCHC: 31.9 g/dL (ref 30.0–36.0)
MCV: 95.7 fL (ref 78.0–100.0)
Monocytes Absolute: 0.3 10*3/uL (ref 0.1–1.0)
Monocytes Relative: 8 % (ref 3–12)
Neutrophils Relative %: 48 % (ref 43–77)
Phenytoin Lvl: 21.6 ug/mL — ABNORMAL HIGH (ref 10.0–20.0)
RBC: 4.16 M/uL (ref 3.87–5.11)
Total Bilirubin: 0.3 mg/dL (ref 0.3–1.2)
Triglycerides: 117 mg/dL (ref <150)
VLDL: 23 mg/dL (ref 0–40)

## 2007-06-21 ENCOUNTER — Ambulatory Visit: Payer: Self-pay | Admitting: Family Medicine

## 2007-06-22 ENCOUNTER — Telehealth: Payer: Self-pay | Admitting: *Deleted

## 2007-06-27 ENCOUNTER — Ambulatory Visit: Payer: Self-pay | Admitting: Gastroenterology

## 2007-07-01 ENCOUNTER — Telehealth: Payer: Self-pay | Admitting: Family Medicine

## 2007-07-20 ENCOUNTER — Ambulatory Visit: Payer: Self-pay | Admitting: Family Medicine

## 2007-07-20 ENCOUNTER — Telehealth (INDEPENDENT_AMBULATORY_CARE_PROVIDER_SITE_OTHER): Payer: Self-pay | Admitting: Family Medicine

## 2007-07-26 ENCOUNTER — Ambulatory Visit: Payer: Self-pay | Admitting: Gastroenterology

## 2007-07-27 ENCOUNTER — Ambulatory Visit: Payer: Self-pay | Admitting: Family Medicine

## 2007-08-08 ENCOUNTER — Telehealth: Payer: Self-pay | Admitting: *Deleted

## 2007-08-09 ENCOUNTER — Ambulatory Visit: Payer: Self-pay | Admitting: Family Medicine

## 2007-08-09 ENCOUNTER — Encounter (INDEPENDENT_AMBULATORY_CARE_PROVIDER_SITE_OTHER): Payer: Self-pay | Admitting: Family Medicine

## 2007-08-09 DIAGNOSIS — B957 Other staphylococcus as the cause of diseases classified elsewhere: Secondary | ICD-10-CM | POA: Insufficient documentation

## 2007-08-10 ENCOUNTER — Encounter: Payer: Self-pay | Admitting: Family Medicine

## 2007-08-12 ENCOUNTER — Encounter (INDEPENDENT_AMBULATORY_CARE_PROVIDER_SITE_OTHER): Payer: Self-pay | Admitting: Family Medicine

## 2007-08-13 ENCOUNTER — Telehealth: Payer: Self-pay | Admitting: *Deleted

## 2007-08-25 ENCOUNTER — Emergency Department (HOSPITAL_COMMUNITY): Admission: EM | Admit: 2007-08-25 | Discharge: 2007-08-25 | Payer: Self-pay | Admitting: Emergency Medicine

## 2007-10-15 ENCOUNTER — Telehealth: Payer: Self-pay | Admitting: Family Medicine

## 2007-11-04 ENCOUNTER — Emergency Department (HOSPITAL_COMMUNITY): Admission: EM | Admit: 2007-11-04 | Discharge: 2007-11-04 | Payer: Self-pay | Admitting: Family Medicine

## 2007-11-04 ENCOUNTER — Telehealth (INDEPENDENT_AMBULATORY_CARE_PROVIDER_SITE_OTHER): Payer: Self-pay | Admitting: *Deleted

## 2007-12-05 ENCOUNTER — Ambulatory Visit: Payer: Self-pay | Admitting: Family Medicine

## 2007-12-06 ENCOUNTER — Encounter: Admission: RE | Admit: 2007-12-06 | Discharge: 2007-12-06 | Payer: Self-pay | Admitting: Family Medicine

## 2007-12-07 ENCOUNTER — Encounter: Payer: Self-pay | Admitting: Gastroenterology

## 2007-12-07 ENCOUNTER — Ambulatory Visit: Payer: Self-pay | Admitting: Family Medicine

## 2007-12-07 DIAGNOSIS — K5900 Constipation, unspecified: Secondary | ICD-10-CM | POA: Insufficient documentation

## 2007-12-07 LAB — CONVERTED CEMR LAB
Bilirubin Urine: NEGATIVE
Glucose, Urine, Semiquant: NEGATIVE
Ketones, urine, test strip: NEGATIVE
Protein, U semiquant: NEGATIVE
Specific Gravity, Urine: 1.015
pH: 7

## 2008-03-18 ENCOUNTER — Encounter: Payer: Self-pay | Admitting: Family Medicine

## 2008-04-04 ENCOUNTER — Ambulatory Visit: Payer: Self-pay | Admitting: Family Medicine

## 2008-04-04 ENCOUNTER — Encounter (INDEPENDENT_AMBULATORY_CARE_PROVIDER_SITE_OTHER): Payer: Self-pay | Admitting: *Deleted

## 2008-04-11 ENCOUNTER — Telehealth: Payer: Self-pay | Admitting: *Deleted

## 2008-04-11 ENCOUNTER — Ambulatory Visit: Payer: Self-pay | Admitting: Family Medicine

## 2008-04-11 ENCOUNTER — Ambulatory Visit (HOSPITAL_COMMUNITY): Admission: RE | Admit: 2008-04-11 | Discharge: 2008-04-11 | Payer: Self-pay | Admitting: Family Medicine

## 2008-04-21 ENCOUNTER — Telehealth: Payer: Self-pay | Admitting: Family Medicine

## 2008-05-02 ENCOUNTER — Ambulatory Visit: Payer: Self-pay | Admitting: Family Medicine

## 2008-06-02 ENCOUNTER — Encounter: Payer: Self-pay | Admitting: Family Medicine

## 2008-06-02 ENCOUNTER — Ambulatory Visit: Payer: Self-pay | Admitting: Family Medicine

## 2008-06-02 LAB — CONVERTED CEMR LAB
BUN: 16 mg/dL (ref 6–23)
CO2: 27 meq/L (ref 19–32)
Calcium: 9.2 mg/dL (ref 8.4–10.5)
Cholesterol: 160 mg/dL (ref 0–200)
Glucose, Bld: 94 mg/dL (ref 70–99)
Total CHOL/HDL Ratio: 3.1
VLDL: 35 mg/dL (ref 0–40)

## 2008-06-04 ENCOUNTER — Encounter: Payer: Self-pay | Admitting: Family Medicine

## 2008-06-10 ENCOUNTER — Ambulatory Visit: Payer: Self-pay | Admitting: Family Medicine

## 2008-06-13 ENCOUNTER — Ambulatory Visit: Payer: Self-pay | Admitting: Family Medicine

## 2008-07-02 ENCOUNTER — Ambulatory Visit (HOSPITAL_COMMUNITY): Admission: RE | Admit: 2008-07-02 | Discharge: 2008-07-02 | Payer: Self-pay | Admitting: Family Medicine

## 2008-09-18 ENCOUNTER — Telehealth: Payer: Self-pay | Admitting: Family Medicine

## 2008-09-18 ENCOUNTER — Ambulatory Visit: Payer: Self-pay | Admitting: Family Medicine

## 2008-09-18 ENCOUNTER — Ambulatory Visit (HOSPITAL_COMMUNITY): Admission: RE | Admit: 2008-09-18 | Discharge: 2008-09-18 | Payer: Self-pay | Admitting: Family Medicine

## 2008-09-23 ENCOUNTER — Encounter: Payer: Self-pay | Admitting: Family Medicine

## 2008-10-28 ENCOUNTER — Telehealth: Payer: Self-pay | Admitting: *Deleted

## 2008-10-29 ENCOUNTER — Encounter: Admission: RE | Admit: 2008-10-29 | Discharge: 2008-10-29 | Payer: Self-pay | Admitting: Family Medicine

## 2008-10-29 ENCOUNTER — Ambulatory Visit: Payer: Self-pay | Admitting: Family Medicine

## 2008-11-12 ENCOUNTER — Encounter: Admission: RE | Admit: 2008-11-12 | Discharge: 2008-12-17 | Payer: Self-pay | Admitting: Family Medicine

## 2008-12-03 ENCOUNTER — Encounter: Payer: Self-pay | Admitting: Family Medicine

## 2008-12-16 ENCOUNTER — Telehealth: Payer: Self-pay | Admitting: *Deleted

## 2008-12-17 ENCOUNTER — Ambulatory Visit: Payer: Self-pay | Admitting: Family Medicine

## 2008-12-17 ENCOUNTER — Telehealth: Payer: Self-pay | Admitting: Family Medicine

## 2008-12-17 LAB — CONVERTED CEMR LAB: Rapid Strep: NEGATIVE

## 2008-12-19 ENCOUNTER — Encounter: Payer: Self-pay | Admitting: Family Medicine

## 2008-12-24 ENCOUNTER — Telehealth: Payer: Self-pay | Admitting: *Deleted

## 2008-12-24 ENCOUNTER — Ambulatory Visit: Payer: Self-pay | Admitting: Family Medicine

## 2009-01-13 ENCOUNTER — Ambulatory Visit: Payer: Self-pay | Admitting: Family Medicine

## 2009-01-13 DIAGNOSIS — M7660 Achilles tendinitis, unspecified leg: Secondary | ICD-10-CM | POA: Insufficient documentation

## 2009-01-15 ENCOUNTER — Ambulatory Visit (HOSPITAL_COMMUNITY): Admission: RE | Admit: 2009-01-15 | Discharge: 2009-01-15 | Payer: Self-pay | Admitting: Family Medicine

## 2009-01-16 DIAGNOSIS — N269 Renal sclerosis, unspecified: Secondary | ICD-10-CM

## 2009-01-21 ENCOUNTER — Telehealth: Payer: Self-pay | Admitting: Family Medicine

## 2009-02-12 ENCOUNTER — Telehealth: Payer: Self-pay | Admitting: Family Medicine

## 2009-02-13 ENCOUNTER — Ambulatory Visit: Payer: Self-pay | Admitting: Family Medicine

## 2009-02-13 DIAGNOSIS — K029 Dental caries, unspecified: Secondary | ICD-10-CM | POA: Insufficient documentation

## 2009-02-18 ENCOUNTER — Telehealth: Payer: Self-pay | Admitting: *Deleted

## 2009-02-24 IMAGING — MG MM DIGITAL SCREENING BILAT
4 series · 4 of 4 positions shown · non-contrast
Comparison: none

DG SCREEN MAMMOGRAM BILATERAL
Bilateral CC and MLO view(s) were taken.

DIGITAL SCREENING MAMMOGRAM WITH CAD:
The breast tissue is almost entirely fatty.  No masses or malignant type calcifications are 
identified.  Compared with prior studies.

[R CC]
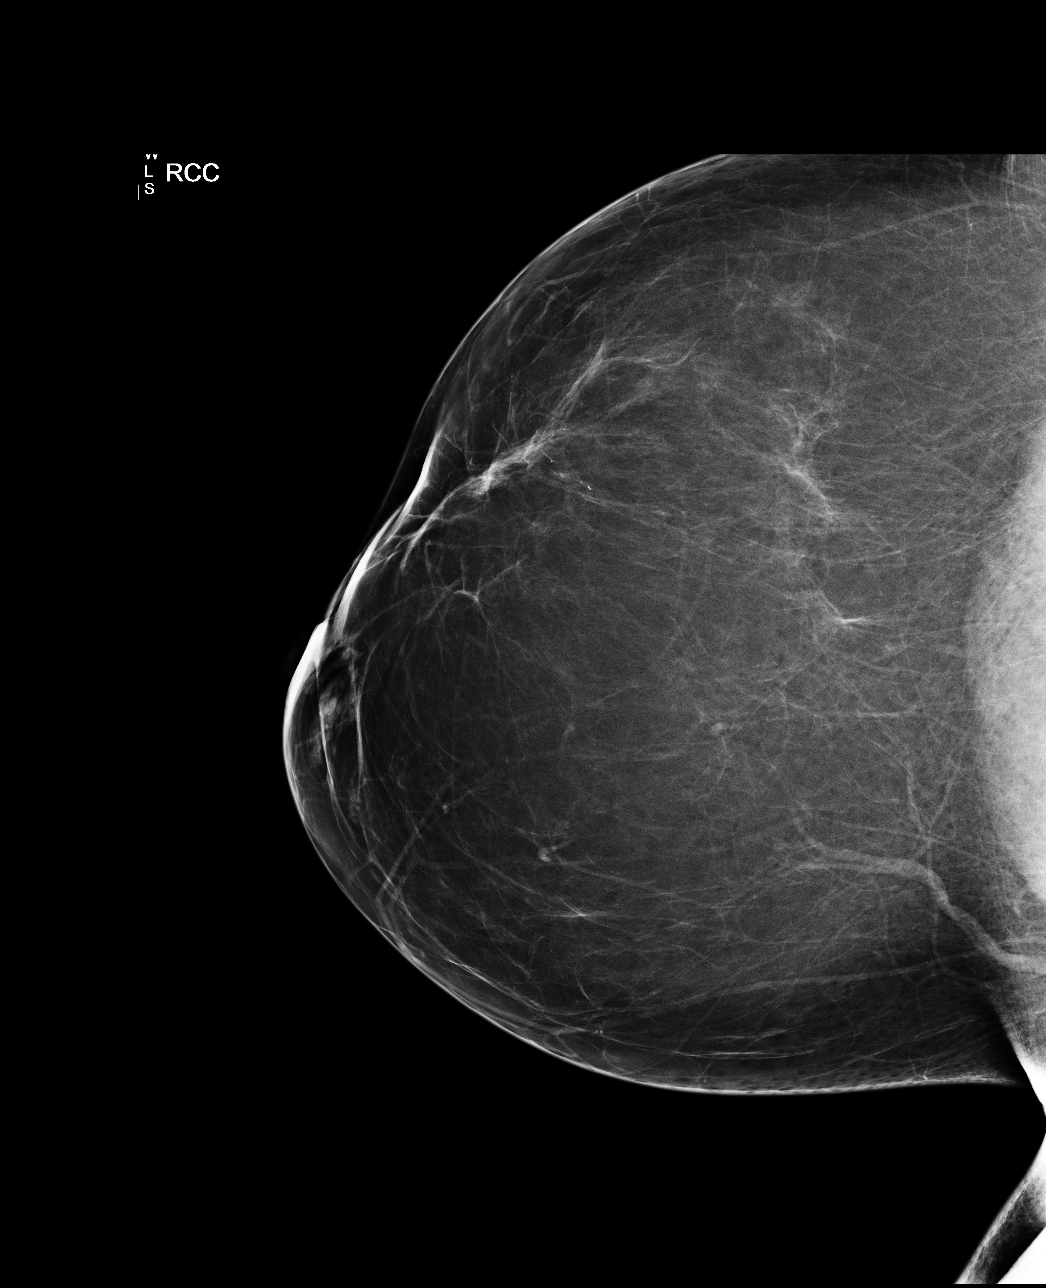

[R MLO]
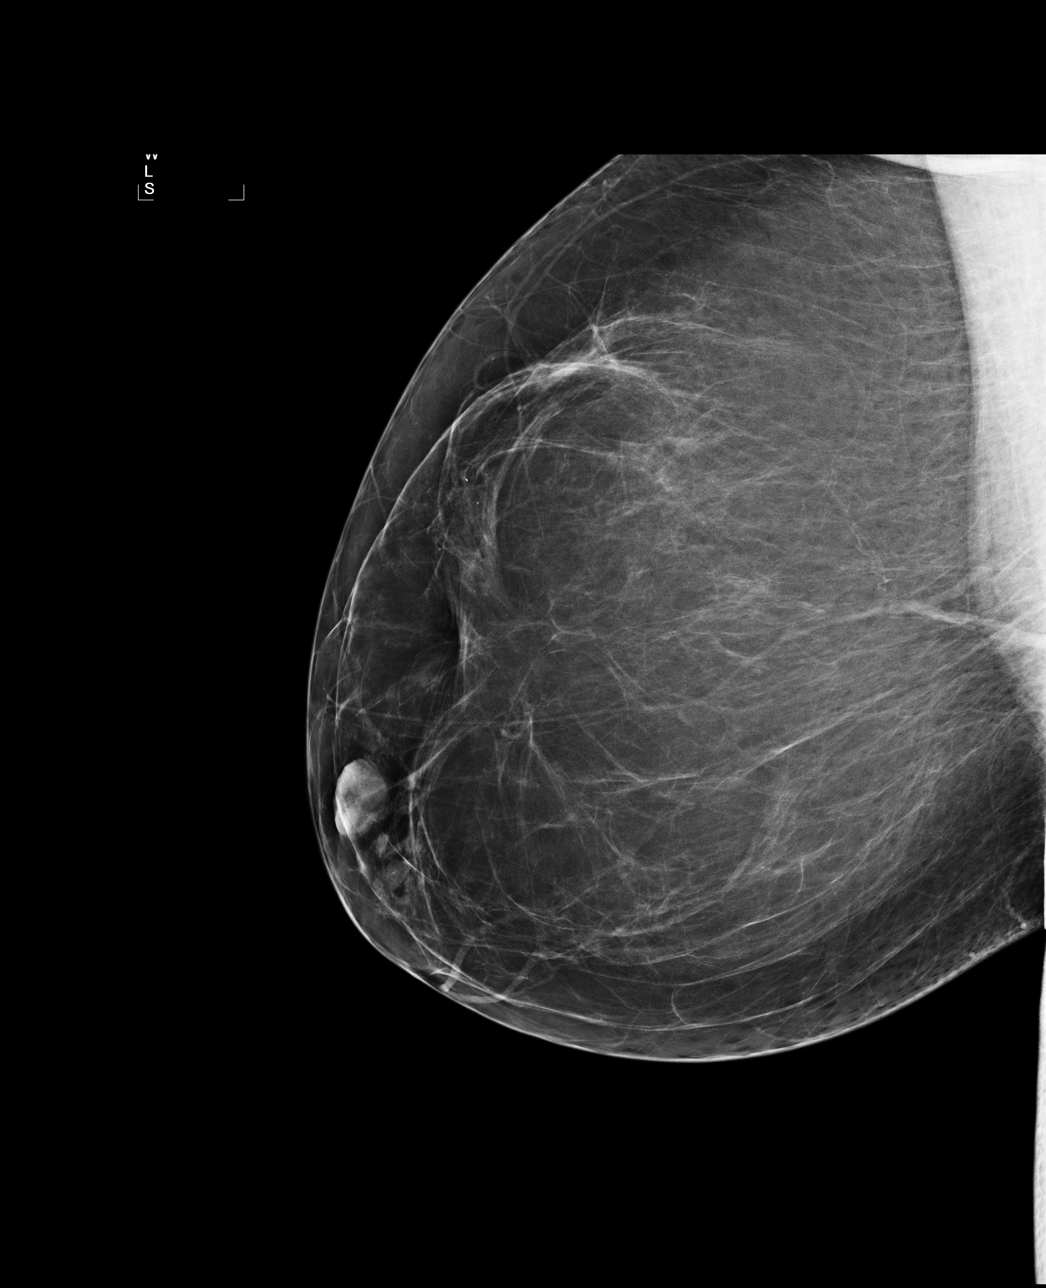

[L CC]
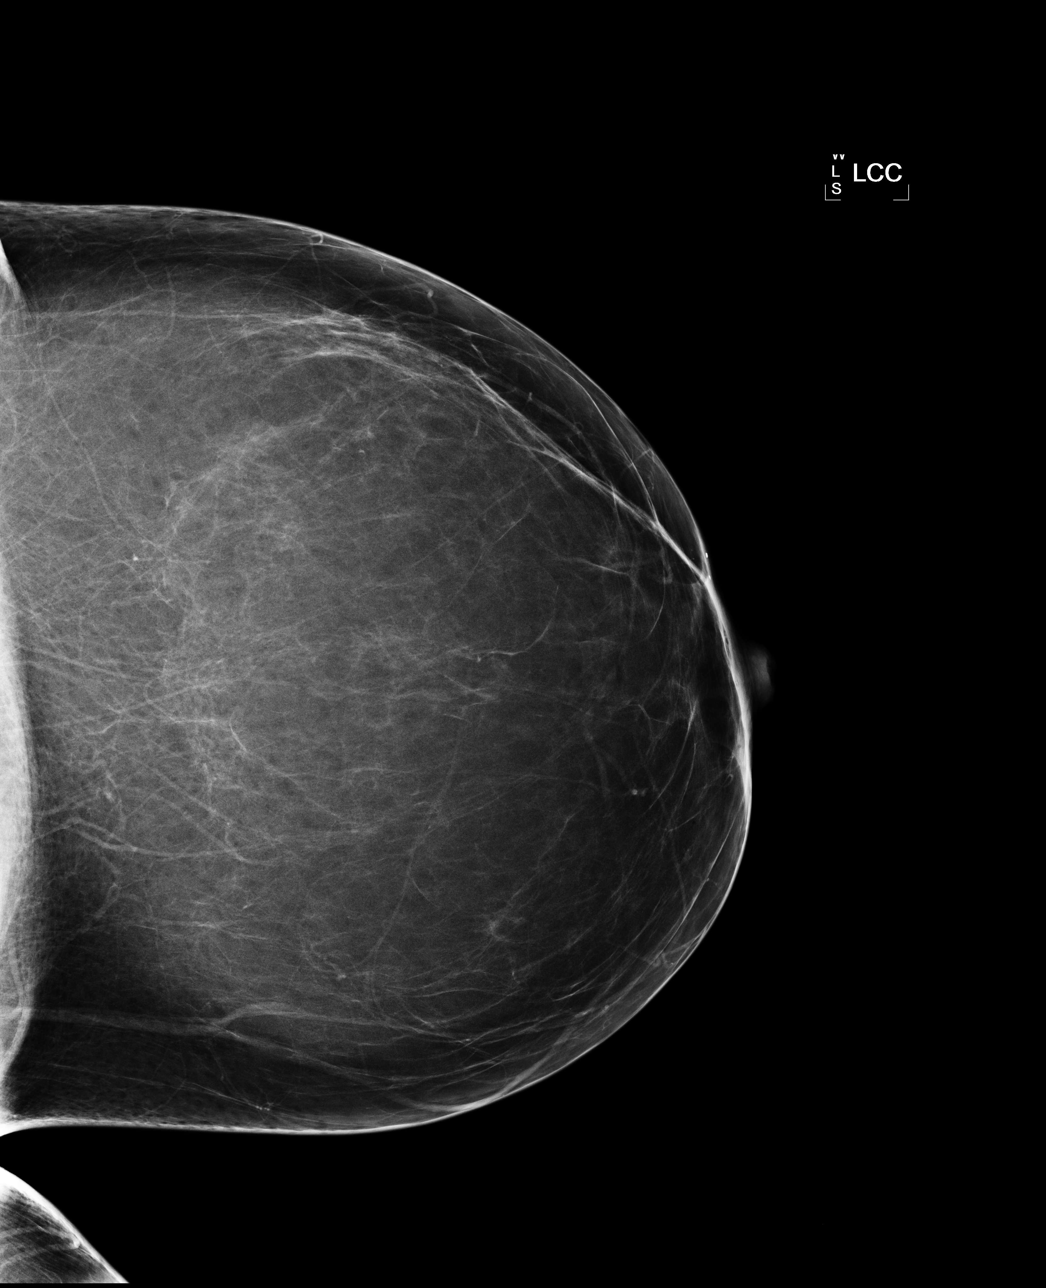

[L MLO]
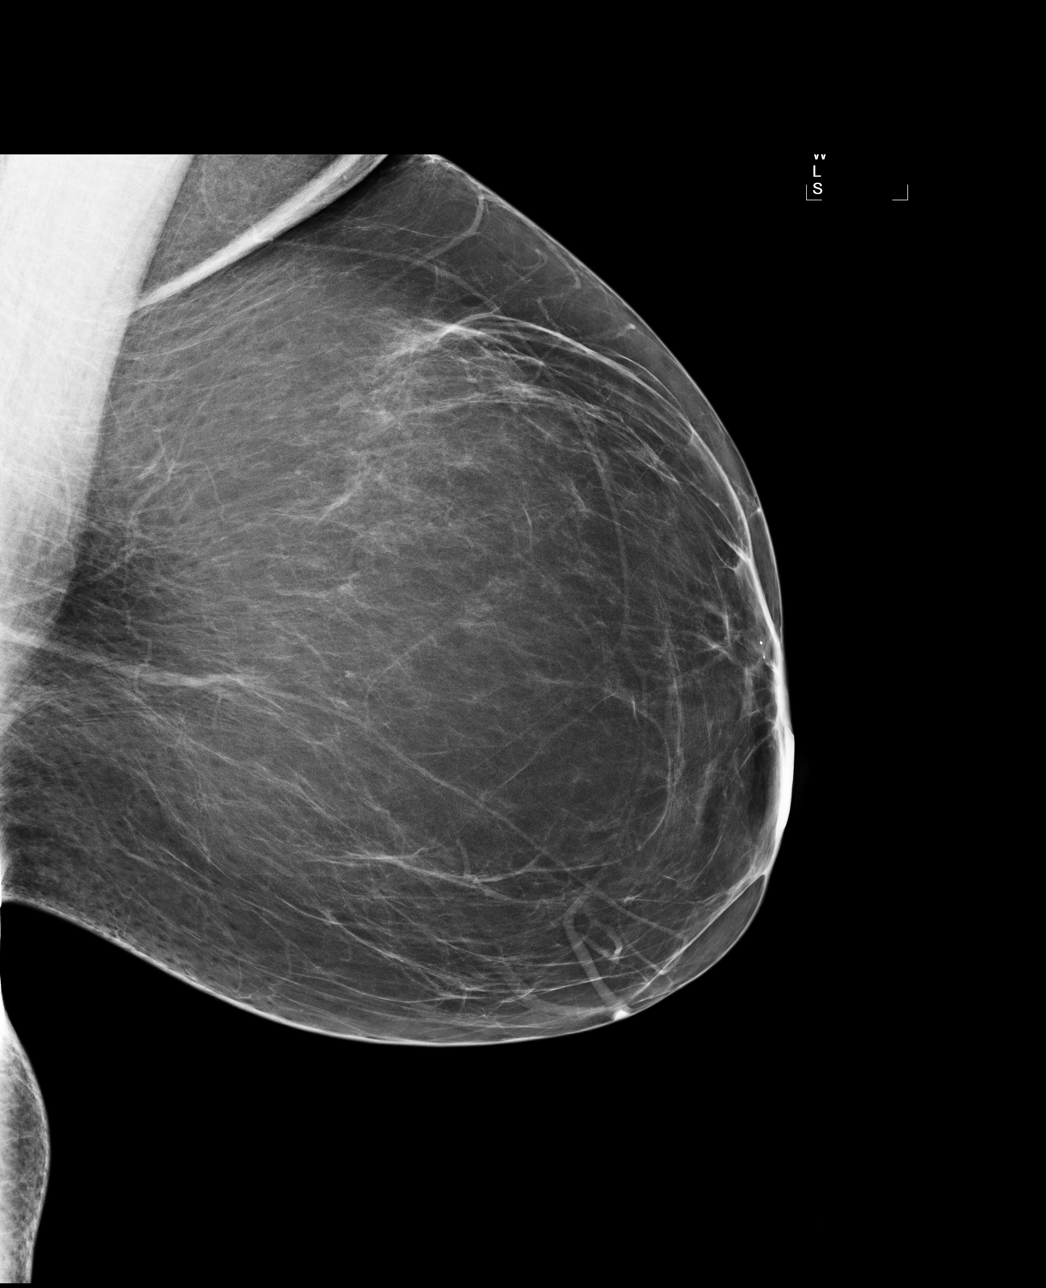

[4 of 4 positions shown; findings below may reference images not displayed]

IMPRESSION: No specific mammographic evidence of malignancy.  Next screening mammogram is recommended in one 
year.

ASSESSMENT: Negative - BI-RADS 1

Screening mammogram in 1 year.
ANALYZED BY COMPUTER AIDED DETECTION. , THIS PROCEDURE WAS A DIGITAL MAMMOGRAM.

## 2009-07-08 ENCOUNTER — Ambulatory Visit: Payer: Self-pay | Admitting: Family Medicine

## 2009-07-08 ENCOUNTER — Encounter: Payer: Self-pay | Admitting: Family Medicine

## 2009-07-08 ENCOUNTER — Telehealth: Payer: Self-pay | Admitting: Family Medicine

## 2009-07-08 LAB — CONVERTED CEMR LAB
ALT: 21 units/L (ref 0–35)
Albumin: 4.5 g/dL (ref 3.5–5.2)
CO2: 25 meq/L (ref 19–32)
Calcium: 9.3 mg/dL (ref 8.4–10.5)
Chloride: 102 meq/L (ref 96–112)
Cholesterol: 181 mg/dL (ref 0–200)
Glucose, Bld: 97 mg/dL (ref 70–99)
HCT: 38.1 % (ref 36.0–46.0)
MCV: 96.9 fL (ref 78.0–100.0)
Phenytoin Lvl: 9.9 ug/mL — ABNORMAL LOW (ref 10.0–20.0)
Platelets: 250 10*3/uL (ref 150–400)
RBC: 3.93 M/uL (ref 3.87–5.11)
Sodium: 140 meq/L (ref 135–145)
Total Bilirubin: 0.2 mg/dL — ABNORMAL LOW (ref 0.3–1.2)
Total Protein: 6.7 g/dL (ref 6.0–8.3)
Triglycerides: 162 mg/dL — ABNORMAL HIGH (ref <150)
WBC: 5.5 10*3/uL (ref 4.0–10.5)

## 2009-09-07 ENCOUNTER — Encounter: Payer: Self-pay | Admitting: Family Medicine

## 2009-11-11 ENCOUNTER — Ambulatory Visit: Payer: Self-pay | Admitting: Family Medicine

## 2009-11-25 ENCOUNTER — Emergency Department (HOSPITAL_COMMUNITY): Admission: EM | Admit: 2009-11-25 | Discharge: 2009-11-25 | Payer: Self-pay | Admitting: Family Medicine

## 2009-11-25 ENCOUNTER — Telehealth: Payer: Self-pay | Admitting: Family Medicine

## 2009-11-25 ENCOUNTER — Emergency Department (HOSPITAL_COMMUNITY): Admission: EM | Admit: 2009-11-25 | Discharge: 2009-11-25 | Payer: Self-pay | Admitting: Emergency Medicine

## 2009-12-16 ENCOUNTER — Encounter: Payer: Self-pay | Admitting: Family Medicine

## 2009-12-23 ENCOUNTER — Telehealth: Payer: Self-pay | Admitting: Family Medicine

## 2009-12-24 ENCOUNTER — Ambulatory Visit: Payer: Self-pay | Admitting: Family Medicine

## 2009-12-24 DIAGNOSIS — L538 Other specified erythematous conditions: Secondary | ICD-10-CM

## 2009-12-27 ENCOUNTER — Telehealth: Payer: Self-pay | Admitting: Family Medicine

## 2009-12-28 ENCOUNTER — Ambulatory Visit: Payer: Self-pay | Admitting: Family Medicine

## 2009-12-28 ENCOUNTER — Telehealth: Payer: Self-pay | Admitting: Family Medicine

## 2010-01-01 ENCOUNTER — Ambulatory Visit: Payer: Self-pay | Admitting: Family Medicine

## 2010-01-01 DIAGNOSIS — L0293 Carbuncle, unspecified: Secondary | ICD-10-CM

## 2010-01-01 DIAGNOSIS — L0292 Furuncle, unspecified: Secondary | ICD-10-CM | POA: Insufficient documentation

## 2010-01-07 ENCOUNTER — Ambulatory Visit: Payer: Self-pay | Admitting: Family Medicine

## 2010-01-07 ENCOUNTER — Telehealth: Payer: Self-pay | Admitting: Family Medicine

## 2010-01-07 ENCOUNTER — Encounter: Payer: Self-pay | Admitting: Family Medicine

## 2010-01-07 DIAGNOSIS — L0291 Cutaneous abscess, unspecified: Secondary | ICD-10-CM

## 2010-01-07 DIAGNOSIS — L0390 Cellulitis, unspecified: Secondary | ICD-10-CM

## 2010-01-08 ENCOUNTER — Ambulatory Visit: Payer: Self-pay | Admitting: Family Medicine

## 2010-01-13 ENCOUNTER — Ambulatory Visit: Payer: Self-pay | Admitting: Family Medicine

## 2010-01-13 ENCOUNTER — Telehealth: Payer: Self-pay | Admitting: Family Medicine

## 2010-01-25 ENCOUNTER — Ambulatory Visit: Payer: Self-pay | Admitting: Family Medicine

## 2010-01-25 ENCOUNTER — Telehealth: Payer: Self-pay | Admitting: Family Medicine

## 2010-02-04 ENCOUNTER — Ambulatory Visit: Payer: Self-pay | Admitting: Family Medicine

## 2010-02-04 DIAGNOSIS — N63 Unspecified lump in unspecified breast: Secondary | ICD-10-CM

## 2010-02-05 ENCOUNTER — Telehealth: Payer: Self-pay | Admitting: Family Medicine

## 2010-02-08 ENCOUNTER — Telehealth: Payer: Self-pay | Admitting: *Deleted

## 2010-02-09 ENCOUNTER — Encounter: Admission: RE | Admit: 2010-02-09 | Discharge: 2010-02-09 | Payer: Self-pay | Admitting: Family Medicine

## 2010-02-16 ENCOUNTER — Telehealth: Payer: Self-pay | Admitting: Family Medicine

## 2010-02-17 ENCOUNTER — Ambulatory Visit: Payer: Self-pay | Admitting: Family Medicine

## 2010-02-17 DIAGNOSIS — M461 Sacroiliitis, not elsewhere classified: Secondary | ICD-10-CM

## 2010-03-03 ENCOUNTER — Encounter
Admission: RE | Admit: 2010-03-03 | Discharge: 2010-04-27 | Payer: Self-pay | Source: Home / Self Care | Admitting: Family Medicine

## 2010-04-01 ENCOUNTER — Telehealth: Payer: Self-pay | Admitting: Family Medicine

## 2010-04-05 ENCOUNTER — Encounter: Payer: Self-pay | Admitting: Family Medicine

## 2010-04-12 ENCOUNTER — Encounter: Payer: Self-pay | Admitting: Family Medicine

## 2010-04-15 ENCOUNTER — Ambulatory Visit (HOSPITAL_COMMUNITY): Admission: RE | Admit: 2010-04-15 | Discharge: 2010-04-15 | Payer: Self-pay | Admitting: Family Medicine

## 2010-04-15 ENCOUNTER — Ambulatory Visit: Payer: Self-pay | Admitting: Family Medicine

## 2010-04-15 ENCOUNTER — Encounter: Payer: Self-pay | Admitting: Family Medicine

## 2010-04-15 LAB — CONVERTED CEMR LAB
ALT: 21 units/L (ref 0–35)
Albumin: 4.7 g/dL (ref 3.5–5.2)
CO2: 31 meq/L (ref 19–32)
Calcium: 9.9 mg/dL (ref 8.4–10.5)
Carbamazepine Lvl: 6.9 ug/mL (ref 4.0–12.0)
Chloride: 99 meq/L (ref 96–112)
Glucose, Bld: 74 mg/dL (ref 70–99)
Potassium: 3.8 meq/L (ref 3.5–5.3)
Sodium: 141 meq/L (ref 135–145)
Total Bilirubin: 0.3 mg/dL (ref 0.3–1.2)
Total Protein: 7.6 g/dL (ref 6.0–8.3)

## 2010-04-20 ENCOUNTER — Encounter: Payer: Self-pay | Admitting: Family Medicine

## 2010-07-07 ENCOUNTER — Ambulatory Visit: Admit: 2010-07-07 | Payer: Self-pay

## 2010-07-25 ENCOUNTER — Encounter: Payer: Self-pay | Admitting: Sports Medicine

## 2010-08-05 NOTE — Miscellaneous (Signed)
Summary: Procedure consent  Procedure consent   Imported By: De Nurse 01/12/2010 15:04:24  _____________________________________________________________________  External Attachment:    Type:   Image     Comment:   External Document

## 2010-08-05 NOTE — Assessment & Plan Note (Signed)
Summary: rash under breasts & in groin/[place]/alm   Vital Signs:  Patient profile:   55 year old female Height:      62.0 inches Weight:      221.5 pounds BMI:     40.66 Temp:     98.1 degrees F oral Pulse rate:   80 / minute BP sitting:   128 / 78  (left arm) Cuff size:   regular  Vitals Entered By: Garen Grams LPN (December 24, 2009 10:46 AM) CC: rash under breasts and n groin area Is Patient Diabetic? No Pain Assessment Patient in pain? no        Primary Care Provider:  Ancil Boozer  MD  CC:  rash under breasts and n groin area.  History of Present Illness: 1. rash Has had a rash under both breasts and in groin area. Very pruritic  and sometimes painful. Areas are persistently moist requiring her to change her clothes frequently. Denies any new medicines but did restart effexor  ~ 2 weeks ago per her report; no new detergents, soaps. Has used a powder perfume within the last 2 weeks but states that she has used it before.   fevers:  no   chills: no    nausea: no    vomiting: no    diarrhea: no      PMHx:  denies any problems with sugar levels in the past.   Habits & Providers  Alcohol-Tobacco-Diet     Tobacco Status: current     Tobacco Counseling: to quit use of tobacco products     Cigarette Packs/Day: 0.5  Current Medications (verified): 1)  Tegretol Xr 200 Mg  Tb12 (Carbamazepine) .... Take One Tab Every Morning and Two Tabs Every Night. 2)  Flexeril 10 Mg Tabs (Cyclobenzaprine Hcl) .Marland Kitchen.. 1 By Mouth 3x Daily As Needed For Back Pain 3)  Dilantin 100 Mg  Caps (Phenytoin Sodium Extended) .... Take 3 Capsules By Mouth Every Morning 4)  Hydrochlorothiazide 25 Mg  Tabs (Hydrochlorothiazide) .... Take 1 Tab Daily 5)  Effexor 75 Mg  Tabs (Venlafaxine Hcl) .... Take 1 Tab Twice Daily 6)  Toprol Xl 100 Mg  Tb24 (Metoprolol Succinate) .... Take One Tab Daily 7)  Norvasc 10 Mg  Tabs (Amlodipine Besylate) .... Take Once Daily 8)  Crestor 20 Mg  Tabs (Rosuvastatin Calcium) ....  Take One Tab Daily. 9)  Naprosyn 500 Mg Tabs (Naproxen) .... One Tablet Two Times A Day As Needed For Severe Back Pain 10)  Protonix 40 Mg Pack (Pantoprazole Sodium) .Marland Kitchen.. 1 By Mouth Once Daily For Heartburn  Allergies (verified): No Known Drug Allergies  Review of Systems       review of systems as noted in HPI section   Physical Exam  General:  Vital signs reviewed -- obese but otherwise normal Alert, appropriate; well-dressed and well-nourished  Lungs:  work of breathing unlabored, clear to auscultation bilaterally; no wheezes, rales, or ronchi; good air movement throughout  Heart:  regular rate and rhythm, no murmurs; normal s1/s2  Skin:  erythematous, moist, slightly macerated skin in intertriginous region of the breast, pannus and groin. No frank drainage or discharge. Exam consistent with with fungal intertrigo.    Impression & Recommendations:  Problem # 1:  INTERTRIGO (ZOX-096.04) Assessment New Treat with nystatin cream and systemic diflucan. Sugar is below 100 today so I am not worried about predisposing diabetes. Pt has financial difficulty but thinks she can find the money for the treatment. Return parameters and warning signs  of worsening illness discussed.  Patient agreeable. See instructions. Orders: Glucose Cap-[facility] (16109) [facility]- Est Level  3 (60454)  Complete Medication List: 1)  Tegretol Xr 200 Mg Tb12 (Carbamazepine) .... Take one tab every morning and two tabs every night. 2)  Flexeril 10 Mg Tabs (Cyclobenzaprine hcl) .Marland Kitchen.. 1 by mouth 3x daily as needed for back pain 3)  Dilantin 100 Mg Caps (Phenytoin sodium extended) .... Take 3 capsules by mouth every morning 4)  Hydrochlorothiazide 25 Mg Tabs (Hydrochlorothiazide) .... Take 1 tab daily 5)  Effexor 75 Mg Tabs (Venlafaxine hcl) .... Take 1 tab twice daily 6)  Toprol Xl 100 Mg Tb24 (Metoprolol succinate) .... Take one tab daily 7)  Norvasc 10 Mg Tabs (Amlodipine besylate) .... Take once daily 8)  Crestor 20 Mg  Tabs (Rosuvastatin calcium) .... Take one tab daily. 9)  Naprosyn 500 Mg Tabs (Naproxen) .... One tablet two times a day as needed for severe back pain 10)  Protonix 40 Mg Pack (Pantoprazole sodium) .Marland Kitchen.. 1 by mouth once daily for heartburn 11)  Diflucan 150 Mg Tabs (Fluconazole) .... One by mouth x 1 and then repeat in 72 hours 12)  Nystatin 100000 Unit/gm Crea (Nystatin) .... Apply to affected areas 2-3 times a day; continue 48 hours after the rash clears  Patient Instructions: 1)  use the cream 2-3 times a day; continue 48 hours after the rash clears 2)  take the pill once and then repeat in 72 hours. 3)  If you have to pick one medicine, use the pill.  4)  follow-up if not improving by Monday. Prescriptions: DIFLUCAN 150 MG TABS (FLUCONAZOLE) one by mouth x 1 and then repeat in 72 hours  #2 x 0   Entered and Authorized by:   Myrtie Soman  MD   Signed by:   Myrtie Soman  MD on 12/24/2009   Method used:   Faxed to ...       [facility] (retail)       [address] [place], [place]  [address]       Ph: [phone]       Fax: [phone]   RxID:   [id number] NYSTATIN 100000 UNIT/GM CREA (NYSTATIN) apply to affected areas 2-3 times a day; continue 48 hours after the rash clears  #2 tubes x 0   Entered and Authorized by:   Myrtie Soman  MD   Signed by:   Myrtie Soman  MD on 12/24/2009   Method used:   Faxed to ...       [facility] (retail)       [address] [place], [place]  [address]       Ph: [phone]       Fax: [phone]   RxID:   [id number] DIFLUCAN 150 MG TABS (FLUCONAZOLE) one by mouth x 1 and then repeat in 72 hours  #2 x 0   Entered and Authorized by:   Myrtie Soman  MD   Signed by:   Myrtie Soman  MD on 12/24/2009   Method used:   Print then Give to Patient   RxID:   [id number]

## 2010-08-05 NOTE — Assessment & Plan Note (Signed)
Summary: f/u last visit/per saunders/eo  [facility] [address]  Vital Signs:  Patient profile:   55 year old female Height:      62 inches Weight:      216.5 pounds BMI:     39.74 Temp:     98.7 degrees F Pulse rate:   97 / minute BP sitting:   118 / 76  (left arm) Cuff size:   regular  Vitals Entered By: Golden Circle RN (February 04, 2010 1:57 PM) CC: boil under right breast x2 week Is Patient Diabetic? No Pain Assessment Patient in pain? yes     Location: right leg Intensity: 6 Type: aching   Primary Care Provider:  Alvia Grove DO  CC:  boil under right breast x2 week.  History of Present Illness: 55 yo female f/u multiple abscesses and recurrent abscesses. Intially started about 8 weeks ago.  First one was located on her trunk and then they spread to her entire abdomen and under her breasts.  Had one I&D'd a few weeks ago with some improvement.  Peak of quantity was about 4 weeks ago, some still present, but are overall improving with the 10 day course of bactrim that pt just completed.  No new abscesses, just not complete resolution of old ones. Pt most concerned about shrinking absess under her right breast.  Pt denies ever having these before.  No fevers, no chills, no sweats.    54 33 year old granddaughters now have some as well.    Habits & Providers  Alcohol-Tobacco-Diet     Tobacco Status: current     Tobacco Counseling: to quit use of tobacco products     Cigarette Packs/Day: 0.5  Current Medications (verified): 1)  Tegretol Xr 200 Mg  Tb12 (Carbamazepine) .... Take One Tab Every Morning and Two Tabs Every Night. 2)  Flexeril 10 Mg Tabs (Cyclobenzaprine Hcl) .Marland Kitchen.. 1 By Mouth 3x Daily As Needed For Back Pain 3)  Dilantin 100 Mg  Caps (Phenytoin Sodium Extended) .... Take 3 Capsules By Mouth Every Morning 4)  Hydrochlorothiazide 25 Mg  Tabs (Hydrochlorothiazide) .... Take 1 Tab Daily 5)  Effexor 75 Mg  Tabs (Venlafaxine Hcl) .... Take 1 Tab Twice Daily 6)   Toprol Xl 100 Mg  Tb24 (Metoprolol Succinate) .... Take One Tab Daily 7)  Norvasc 10 Mg  Tabs (Amlodipine Besylate) .... Take Once Daily 8)  Crestor 20 Mg  Tabs (Rosuvastatin Calcium) .... Take One Tab Daily. 9)  Naprosyn 500 Mg Tabs (Naproxen) .... One Tablet Two Times A Day As Needed For Severe Back Pain 10)  Protonix 40 Mg Pack (Pantoprazole Sodium) .Marland Kitchen.. 1 By Mouth Once Daily For Heartburn 11)  Clotrimazole 1 % Crea (Clotrimazole) .... Apply To Affected Areas Two Times A Day For 1 Week 12)  Hydrocortisone 2.5 % Oint (Hydrocortisone) .... Apply To Affected Areas Once Daily For 1 Week 13)  Bactrim Ds 800-160 Mg Tabs (Sulfamethoxazole-Trimethoprim) .Marland Kitchen.. 1 Tab By Mouth Twice A Day For 10 Days 14)  Bactroban 2 % Oint (Mupirocin) .... Apply Small Amount To Each Nostril 3 Times Per Day For 5 Days 15)  Bactrim 400-80 Mg Tabs (Sulfamethoxazole-Trimethoprim) .... Take 1 Pill By Mouth Two Times A Day X 21 Days After You Complete 10 Day Course of Other Antibiotic 16)  Hibiclens 4 % Liqd (Chlorhexidine Gluconate) .... Cleanse Skin X 30 Second, Repeat As Needed.  Allergies (verified): 1)  ! Diflucan (Fluconazole)  Past History:  Past Medical History: Last updated: 12/16/2009  G4P3 GI :chronic constipation,  tx with Miralax by [facility] for belching/ hiatal, UGI sm bwl follow through- 9/01 - GERD only hospitalized 11/29 resistant otitis /?mastoiditis hospitalized 12/03 costochondritis - adenosine CL 11/07/02-no ischemia, EF 41% chronic back pain - recent back and hip xrays show signs of chronic greater trochanteric bursa inflammation L>R, facet degenerative changes, bilateral mild hip degenerative changes, and also ? 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 ACHILLES TENDINITIS, BILATERAL (ICD-726.71) CARPAL TUNNEL  SYNDROME (ICD-354.0) PLANTAR FASCIITIS, BILATERAL (ICD-728.71) Hx of MRSA INFECTION (ICD-041.19) DERMATITIS, ALLERGIC (ICD-692.9) OBESITY NOS (ICD-278.00) TOBACCO DEPENDENCE (ICD-305.1) HYPERTENSION, BENIGN SYSTEMIC (ICD-401.1) HYPERLIPIDEMIA (ICD-272.4) SOMATIZATION DISORDER (ICD-300.81) DEPRESSION, MAJOR, RECURRENT (ICD-296.30) CONVULSIONS, SEIZURES, NOS (ICD-780.39) - doesn't see neurologist for financial reasons  Past Surgical History: Last updated: 01/18/2007 BTL - 1991  complete hysterectomy for fibroids - 03/20/2002 kidney stenting - 03/20/2002 laproscopic cholecystectomy 1996  h/o peritonsillar abscess s/p I&D 10/99  Family History: Last updated: 05/25/2007 CHF - grandfather, DM - aunt Grandmother with Alzheimers  Social History: Last updated: 12/16/2009 poor family dynamics contributing to depressed  emotional state; medicaid;daughter recently moved out. [facility]).  new grandson adrienne malik mitchell smokes 1/2 ppd. financial strain  Risk Factors: Caffeine Use: 1 (12/07/2007) Exercise: no (12/07/2007)  Risk Factors: Smoking Status: current (02/04/2010) Packs/Day: 0.5 (02/04/2010)  Social History: Packs/Day:  0.5  Review of Systems       The patient complains of breast masses.  The patient denies fever, weight loss, weight gain, vision loss, chest pain, syncope, dyspnea on exertion, headaches, hemoptysis, abdominal pain, genital sores, muscle weakness, suspicious skin lesions, difficulty walking, depression, and abnormal bleeding.    Physical Exam  General:  VS reviewed, alert, well-developed, well-nourished, and well-hydrated.   Neck:  supple.  no LAD Breasts:  R breast: 2x2cm firm, tender mass at 6:00 position.   warm to touch.  No areas of fluctuance.  No nipple discharge.  No other masses appreciated. L breast normal. Lungs:  normal respiratory effort.   Heart:  normal rate and regular rhythm.   Abdomen:  soft and non-tender.   Skin:  multiple  healing abscesses noted on skin, mainly trunk area.   No drainage.  Firm.  No fluctuance. Cervical Nodes:  No lymphadenopathy noted   Impression & Recommendations:  Problem # 1:  ABSCESS, SKIN (ICD-682.9) Assessment Improved continue Abx treatment for another 10 days, then lower dose of additional 3 week tx.  Pt may need to be on daily abx ppx. Gave pt printed info on MRSA skin infections and discussed ways to minimize spread, ie: don't share towels, clothes, wash hands frequently... Advised GD need to be seen and tx by physcian as well Her updated medication list for this problem includes:    Bactrim Ds 800-160 Mg Tabs (Sulfamethoxazole-trimethoprim) .Marland Kitchen... 1 tab by mouth twice a day for 10 days    Bactrim 400-80 Mg Tabs (Sulfamethoxazole-trimethoprim) .Marland Kitchen... Take 1 pill by mouth two times a day x 21 days after you complete 10 day course of other antibiotic  Orders: [facility]- Est  Level 4 (95638)  Problem # 2:  BREAST MASS, RIGHT (ICD-611.72) Assessment: Unchanged This is likley just another abscess, but pt is due for mammogram anyway, so reasonable to order this today. Orders: Mammogram (Diagnostic) (Mammo)  Problem # 3:  HYPERTENSION, BENIGN SYSTEMIC (ICD-401.1) Assessment: Unchanged stable, c0onitnue current meds Her updated medication  list for this problem includes:    Hydrochlorothiazide 25 Mg Tabs (Hydrochlorothiazide) .Marland Kitchen... Take 1 tab daily    Toprol Xl 100 Mg Tb24 (Metoprolol succinate) .Marland Kitchen... Take one tab daily    Norvasc 10 Mg Tabs (Amlodipine besylate) .Marland Kitchen... Take once daily  Complete Medication List: 1)  Tegretol Xr 200 Mg Tb12 (Carbamazepine) .... Take one tab every morning and two tabs every night. 2)  Flexeril 10 Mg Tabs (Cyclobenzaprine hcl) .Marland Kitchen.. 1 by mouth 3x daily as needed for back pain 3)  Dilantin 100 Mg Caps (Phenytoin sodium extended) .... Take 3 capsules by mouth every morning 4)  Hydrochlorothiazide 25 Mg Tabs (Hydrochlorothiazide) .... Take 1 tab daily 5)   Effexor 75 Mg Tabs (Venlafaxine hcl) .... Take 1 tab twice daily 6)  Toprol Xl 100 Mg Tb24 (Metoprolol succinate) .... Take one tab daily 7)  Norvasc 10 Mg Tabs (Amlodipine besylate) .... Take once daily 8)  Crestor 20 Mg Tabs (Rosuvastatin calcium) .... Take one tab daily. 9)  Naprosyn 500 Mg Tabs (Naproxen) .... One tablet two times a day as needed for severe back pain 10)  Protonix 40 Mg Pack (Pantoprazole sodium) .Marland Kitchen.. 1 by mouth once daily for heartburn 11)  Clotrimazole 1 % Crea (Clotrimazole) .... Apply to affected areas two times a day for 1 week 12)  Hydrocortisone 2.5 % Oint (Hydrocortisone) .... Apply to affected areas once daily for 1 week 13)  Bactrim Ds 800-160 Mg Tabs (Sulfamethoxazole-trimethoprim) .Marland Kitchen.. 1 tab by mouth twice a day for 10 days 14)  Bactroban 2 % Oint (Mupirocin) .... Apply small amount to each nostril 3 times per day for 5 days 15)  Bactrim 400-80 Mg Tabs (Sulfamethoxazole-trimethoprim) .... Take 1 pill by mouth two times a day x 21 days after you complete 10 day course of other antibiotic 16)  Hibiclens 4 % Liqd (Chlorhexidine gluconate) .... Cleanse skin x 30 second, repeat as needed.  Patient Instructions: 1)  Nice to meet you! 2)  Please take the antiobiotics for 10 more days and then the other ones for 21 days. 3)  I have sent some chlohexadine wash to the pharmacy for you, you this as we discussed. 4)  Please review the information on MRSA skin infections I have provided you with and call if you have any questions. 5)  We'll set you up with a mammogram shortly.  6)  Please schedule a follow-up appointment in 1 month.  Prescriptions: BACTRIM DS 800-160 MG TABS (SULFAMETHOXAZOLE-TRIMETHOPRIM) 1 tab by mouth twice a day for 10 days  #20 x 0   Entered and Authorized by:   Alvia Grove DO   Signed by:   Alvia Grove DO on 02/05/2010   Method used:   Handwritten   RxID:   [id number] HIBICLENS 4 % LIQD (CHLORHEXIDINE GLUCONATE) cleanse skin x 30  second, repeat as needed.  #40mL x 0   Entered and Authorized by:   Alvia Grove DO   Signed by:   Alvia Grove DO on 02/05/2010   Method used:   Electronically to        [facility] [phone]* (retail)       [address]       [place], [place]  [address]       Ph: [phone]       Fax: [phone]   RxID:   [id number] BACTRIM 400-80 MG TABS (SULFAMETHOXAZOLE-TRIMETHOPRIM) take 1 pill by mouth two times a day x 21 days AFTER you complete 10 day course  of other antibiotic  #42 x 0   Entered and Authorized by:   Alvia Grove DO   Signed by:   Alvia Grove DO on 02/05/2010   Method used:   Handwritten   RxID:   [id number]

## 2010-08-05 NOTE — Progress Notes (Signed)
Summary: Triage   Phone Note Call from Patient Call back at Home Phone [phone]   Reason for Call: Talk to Nurse Summary of Call: pt had an abcess & the packing came out, wants to know if she should come in? Initial call taken by: Knox Royalty,  January 13, 2010 9:38 AM  Follow-up for Phone Call        states the area is draining a small amount and there is an open hole. wants it checked. work in at 1:30. aware there will be a wait Follow-up by: Golden Circle RN,  January 13, 2010 9:46 AM

## 2010-08-05 NOTE — Assessment & Plan Note (Signed)
Summary: leg pain/[place]   Vital Signs:  Patient profile:   55 year old female Height:      62 inches Weight:      222 pounds BMI:     40.75 Temp:     98.7 degrees F oral Pulse rate:   104 / minute BP sitting:   132 / 94  (right arm) Cuff size:   regular  Vitals Entered By: Garen Grams LPN (February 17, 2010 10:45 AM) CC: lower back and left hip pain, Back Pain Is Patient Diabetic? No Pain Assessment Patient in pain? yes     Location: lwoer back/left hip Intensity: 8   Primary Care Oziah Vitanza:  Alvia Grove DO  CC:  lower back and left hip pain and Back Pain.  History of Present Illness:       This is a 55 year old woman who presents with Back Pain.  The patient denies fever, chills, weakness, loss of sensation, urinary incontinence, and urinary retention.  The pain is located in the left SI joint.  The pain radiates to the left leg below the knee and left foot.  The pain is made worse by standing or walking.  The pain is made better by inactivity.    Habits & Providers  Alcohol-Tobacco-Diet     Tobacco Status: current     Tobacco Counseling: to quit use of tobacco products     Cigarette Packs/Day: 0.5  Current Problems (verified): 1)  Sacroiliitis, Left  (ICD-720.2) 2)  Breast Mass, Right  (ICD-611.72) 3)  Abscess, Skin  (ICD-682.9) 4)  Boils, Recurrent  (ICD-680.9) 5)  Intertrigo  (ICD-695.89) 6)  Dental Caries  (ICD-521.00) 7)  Unspecified Renal Sclerosis  (ICD-587) 8)  Achilles Tendinitis, Bilateral  (ICD-726.71) 9)  Plantar Fasciitis, Bilateral  (ICD-728.71) 10)  Back Pain, Low  (ICD-724.2) 11)  Herniated Lumbar Disc  (ICD-722.10) 12)  Carpal Tunnel Syndrome  (ICD-354.0) 13)  Hx of Mrsa Infection  (ICD-041.19) 14)  Dermatitis, Allergic  (ICD-692.9) 15)  Constipation  (ICD-564.00) 16)  Gerd, Severe  (ICD-530.81) 17)  Hx of Gastric Ulcer Acute Without Hemorrhage  (ICD-533.30) 18)  Obesity Nos  (ICD-278.00) 19)  Tobacco Dependence  (ICD-305.1) 20)   Hypertension, Benign Systemic  (ICD-401.1) 21)  Hyperlipidemia  (ICD-272.4) 22)  Somatization Disorder  (ICD-300.81) 23)  Depression, Major, Recurrent  (ICD-296.30) 24)  Convulsions, Seizures, Nos  (ICD-780.39)  Current Medications (verified): 1)  Tegretol Xr 200 Mg  Tb12 (Carbamazepine) .... Take One Tab Every Morning and Two Tabs Every Night. 2)  Flexeril 10 Mg Tabs (Cyclobenzaprine Hcl) .Marland Kitchen.. 1 By Mouth 3x Daily As Needed For Back Pain 3)  Dilantin 100 Mg  Caps (Phenytoin Sodium Extended) .... Take 3 Capsules By Mouth Every Morning 4)  Hydrochlorothiazide 25 Mg  Tabs (Hydrochlorothiazide) .... Take 1 Tab Daily 5)  Effexor 75 Mg  Tabs (Venlafaxine Hcl) .... Take 1 Tab Twice Daily 6)  Toprol Xl 100 Mg  Tb24 (Metoprolol Succinate) .... Take One Tab Daily 7)  Norvasc 10 Mg  Tabs (Amlodipine Besylate) .... Take Once Daily 8)  Crestor 20 Mg  Tabs (Rosuvastatin Calcium) .... Take One Tab Daily. 9)  Protonix 40 Mg Pack (Pantoprazole Sodium) .Marland Kitchen.. 1 By Mouth Once Daily For Heartburn 10)  Clotrimazole 1 % Crea (Clotrimazole) .... Apply To Affected Areas Two Times A Day For 1 Week 11)  Hydrocortisone 2.5 % Oint (Hydrocortisone) .... Apply To Affected Areas Once Daily For 1 Week 12)  Bactrim Ds 800-160 Mg Tabs (Sulfamethoxazole-Trimethoprim) .Marland KitchenMarland KitchenMarland Kitchen  1 Tab By Mouth Twice A Day For 10 Days 13)  Bactroban 2 % Oint (Mupirocin) .... Apply Small Amount To Each Nostril 3 Times Per Day For 5 Days 14)  Bactrim 400-80 Mg Tabs (Sulfamethoxazole-Trimethoprim) .... Take 1 Pill By Mouth Two Times A Day X 21 Days After You Complete 10 Day Course of Other Antibiotic 15)  Diclofenac Sodium 75 Mg Tbec (Diclofenac Sodium) .Marland Kitchen.. 1 By Mouth Two Times A Day  Allergies (verified): 1)  ! Diflucan (Fluconazole)  Past History:  Past Medical History: Last updated: 12/16/2009 G4P3 GI :chronic constipation,  tx with Miralax by [facility] for belching/ hiatal, UGI sm bwl follow through- 9/01 - GERD only hospitalized 11/29 resistant  otitis /?mastoiditis hospitalized 12/03 costochondritis - adenosine CL 11/07/02-no ischemia, EF 41% chronic back pain - recent back and hip xrays show signs of chronic greater trochanteric bursa inflammation L>R, facet degenerative changes, bilateral mild hip degenerative changes, and also ? 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 ACHILLES TENDINITIS, BILATERAL (ICD-726.71) CARPAL TUNNEL SYNDROME (ICD-354.0) PLANTAR FASCIITIS, BILATERAL (ICD-728.71) Hx of MRSA INFECTION (ICD-041.19) DERMATITIS, ALLERGIC (ICD-692.9) OBESITY NOS (ICD-278.00) TOBACCO DEPENDENCE (ICD-305.1) HYPERTENSION, BENIGN SYSTEMIC (ICD-401.1) HYPERLIPIDEMIA (ICD-272.4) SOMATIZATION DISORDER (ICD-300.81) DEPRESSION, MAJOR, RECURRENT (ICD-296.30) CONVULSIONS, SEIZURES, NOS (ICD-780.39) - doesn't see neurologist for financial reasons  Past Surgical History: Last updated: 01/18/2007 BTL - 1991  complete hysterectomy for fibroids - 03/20/2002 kidney stenting - 03/20/2002 laproscopic cholecystectomy 1996  h/o peritonsillar abscess s/p I&D 10/99  Family History: Last updated: 05/25/2007 CHF - grandfather, DM - aunt Grandmother with Alzheimers  Social History: Last updated: 12/16/2009 poor family dynamics contributing to depressed  emotional state; medicaid;daughter recently moved out. [facility] [place]).  new grandson adrienne malik [place] smokes 1/2 ppd. financial strain  Risk Factors: Caffeine Use: 1 (12/07/2007) Exercise: no (12/07/2007)  Risk Factors: Smoking Status: current (02/17/2010) Packs/Day: 0.5 (02/17/2010)  Review of Systems  The patient denies anorexia, weight loss, weight gain, chest pain, dyspnea on exertion, peripheral edema, prolonged cough, headaches, hemoptysis, abdominal pain, and severe  indigestion/heartburn.    Physical Exam  General:  alert, well-developed, and well-nourished.   Head:  normocephalic and atraumatic.   Neck:  supple.   Lungs:  normal respiratory effort.   Heart:  normal rate.   Abdomen:  soft.     Detailed Back/Spine Exam  General:    obese.    Gait:    Normal heel-toe gait pattern bilaterally.    Skin:    Intact with no erythema; no scarring.    Inspection:    plantigrade foot with normal alignment of leg, ankle, hindfoot, and foot.   Lumbosacral Exam:  Palpation-spinal tenderness:     SI joint tenderness Lying Straight Leg Raise:    Left:  negative Sitting Straight Leg Raise:    Left:  negative   Impression & Recommendations:  Problem # 1:  SACROILIITIS, LEFT (ICD-720.2) Trial of different NSAID if no improvement, consider imaging. Orders: Physical Therapy Referral (PT) [facility]- Est Level  3 (04540)  Complete Medication List: 1)  Tegretol Xr 200 Mg Tb12 (Carbamazepine) .... Take one tab every morning and two tabs every night. 2)  Flexeril 10 Mg Tabs (Cyclobenzaprine hcl) .Marland Kitchen.. 1 by mouth 3x daily as needed for back pain 3)  Dilantin 100 Mg Caps (Phenytoin sodium extended) .... Take 3 capsules by mouth every morning 4)  Hydrochlorothiazide 25 Mg  Tabs (Hydrochlorothiazide) .... Take 1 tab daily 5)  Effexor 75 Mg Tabs (Venlafaxine hcl) .... Take 1 tab twice daily 6)  Toprol Xl 100 Mg Tb24 (Metoprolol succinate) .... Take one tab daily 7)  Norvasc 10 Mg Tabs (Amlodipine besylate) .... Take once daily 8)  Crestor 20 Mg Tabs (Rosuvastatin calcium) .... Take one tab daily. 9)  Protonix 40 Mg Pack (Pantoprazole sodium) .Marland Kitchen.. 1 by mouth once daily for heartburn 10)  Clotrimazole 1 % Crea (Clotrimazole) .... Apply to affected areas two times a day for 1 week 11)  Hydrocortisone 2.5 % Oint (Hydrocortisone) .... Apply to affected areas once daily for 1 week 12)  Bactrim Ds 800-160 Mg Tabs (Sulfamethoxazole-trimethoprim) .Marland Kitchen.. 1 tab by mouth  twice a day for 10 days 13)  Bactroban 2 % Oint (Mupirocin) .... Apply small amount to each nostril 3 times per day for 5 days 14)  Bactrim 400-80 Mg Tabs (Sulfamethoxazole-trimethoprim) .... Take 1 pill by mouth two times a day x 21 days after you complete 10 day course of other antibiotic 15)  Diclofenac Sodium 75 Mg Tbec (Diclofenac sodium) .Marland Kitchen.. 1 by mouth two times a day  Patient Instructions: 1)  Please schedule a follow-up appointment in 2 weeks.  Prescriptions: FLEXERIL 10 MG TABS (CYCLOBENZAPRINE HCL) 1 by mouth 3x daily as needed for back pain  #30 x 3   Entered and Authorized by:   Tinnie Gens MD   Signed by:   Tinnie Gens MD on 02/17/2010   Method used:   Print then Give to Patient   RxID:   [id number] FLEXERIL 10 MG TABS (CYCLOBENZAPRINE HCL) 1 by mouth 3x daily as needed for back pain  #30 x 3   Entered and Authorized by:   Tinnie Gens MD   Signed by:   Tinnie Gens MD on 02/17/2010   Method used:   Print then Give to Patient   RxID:   [id number] DICLOFENAC SODIUM 75 MG TBEC (DICLOFENAC SODIUM) 1 by mouth two times a day  #60 x 2   Entered and Authorized by:   Tinnie Gens MD   Signed by:   Tinnie Gens MD on 02/17/2010   Method used:   Electronically to        [facility] [phone]* (retail)       [address]       [place], [place]  [address]       Ph: [phone]       Fax: [phone]   RxID:   [id number]

## 2010-08-05 NOTE — Miscellaneous (Signed)
Summary: patient summary   Clinical Lists Changes  Problems: Removed problem of NEUROPATHY (ICD-355.9) Changed problem from ABSCESS, TOOTH (ICD-522.5) to DENTAL CARIES (ICD-521.00) Removed problem of CHEST PAIN, ATYPICAL (ICD-786.59) Changed problem from GASTRIC ULCER ACUTE WITHOUT HEMORRHAGE (ICD-533.30) to History of  GASTRIC ULCER ACUTE WITHOUT HEMORRHAGE (ICD-533.30) Removed problem of IRRITABLE BOWEL SYNDROME (ICD-564.1) Observations: Added new observation of SOCIAL HX: poor family dynamics contributing to depressed  emotional state; medicaid;daughter recently moved out. [facility] [place]  new grandson adrienne malik [place] smokes 1/2 ppd. financial strain  (12/16/2009 10:46) Added new observation of PAST MED HX: G4P3 GI :chronic constipation,  tx with Miralax by [facility] for belching/ hiatal, UGI sm bwl follow through- 9/01 - GERD only hospitalized 11/29 resistant otitis /?mastoiditis hospitalized 12/03 costochondritis - adenosine CL 11/07/02-no ischemia, EF 41% chronic back pain - recent back and hip xrays show signs of chronic greater trochanteric bursa inflammation L>R, facet degenerative changes, bilateral mild hip degenerative changes, and also ? 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 ACHILLES TENDINITIS, BILATERAL (ICD-726.71) CARPAL TUNNEL SYNDROME (ICD-354.0) PLANTAR FASCIITIS, BILATERAL (ICD-728.71) Hx of MRSA INFECTION (ICD-041.19) DERMATITIS, ALLERGIC (ICD-692.9) OBESITY NOS (ICD-278.00) TOBACCO DEPENDENCE (ICD-305.1) HYPERTENSION, BENIGN SYSTEMIC (ICD-401.1) HYPERLIPIDEMIA (ICD-272.4) SOMATIZATION DISORDER (ICD-300.81) DEPRESSION, MAJOR, RECURRENT (ICD-296.30) CONVULSIONS, SEIZURES, NOS (ICD-780.39) - doesn't see neurologist for financial reasons   (12/16/2009  10:46)      Past History:  Past Medical History: G4P3 GI :chronic constipation,  tx with Miralax by [facility] for belching/ hiatal, UGI sm bwl follow through- 9/01 - GERD only hospitalized 11/29 resistant otitis /?mastoiditis hospitalized 12/03 costochondritis - adenosine CL 11/07/02-no ischemia, EF 41% chronic back pain - recent back and hip xrays show signs of chronic greater trochanteric bursa inflammation L>R, facet degenerative changes, bilateral mild hip degenerative changes, and also ? 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 ACHILLES TENDINITIS, BILATERAL (ICD-726.71) CARPAL TUNNEL SYNDROME (ICD-354.0) PLANTAR FASCIITIS, BILATERAL (ICD-728.71) Hx of MRSA INFECTION (ICD-041.19) DERMATITIS, ALLERGIC (ICD-692.9) OBESITY NOS (ICD-278.00) TOBACCO DEPENDENCE (ICD-305.1) HYPERTENSION, BENIGN SYSTEMIC (ICD-401.1) HYPERLIPIDEMIA (ICD-272.4) SOMATIZATION DISORDER (ICD-300.81) DEPRESSION, MAJOR, RECURRENT (ICD-296.30) CONVULSIONS, SEIZURES, NOS (ICD-780.39) - doesn't see neurologist for financial reasons  Social History: poor family dynamics contributing to depressed  emotional state; medicaid;daughter recently moved out. [facility] [place]).  new grandson adrienne malik [place] smokes 1/2 ppd. financial strain

## 2010-08-05 NOTE — Progress Notes (Signed)
    Pt seen in clinic by Dr Rexene Alberts and was Rx Diflucan and Nystatin for intertrigo.   Pt took Diflucan 150mg  x 1 thurs, 2nd tab Sat.  Swelling started yesterday on face.  Eyes are swollen.  Right cheek swollen.  Vision intact.  Breathing intact.  Itchiness is better today vs yesterday.  Rash started on face and chest before pt started using the Nystatin cream.   Pt not driving today or tomorrow, not working.   Rash may be worsened intertrigo or reaction to Diflucan.  Advised benadryl if itchiness continues.   Since breathing not compromised and vision intact, advised pt to call clinic in AM for appt to be seen. Advised ER right away if difficulty breathing or vision impairment.  Pt agreed.  Deshara Rossi MD  December 27, 2009 3:33 PM

## 2010-08-05 NOTE — Assessment & Plan Note (Signed)
Summary: check abcess/[place]   Vital Signs:  Patient profile:   55 year old female Weight:      220 pounds BMI:     40.38 Temp:     98.4 degrees F oral Pulse rate:   88 / minute BP sitting:   128 / 83  (left arm)  Vitals Entered By: Jimmy Footman, CMA (January 13, 2010 1:39 PM) CC: Rt abdominal area draining where abcess was removed Is Patient Diabetic? No   Primary Care Provider:  Alvia Grove DO  CC:  Rt abdominal area draining where abcess was removed.  History of Present Illness: Kristin [name] presents to clinic today because the packing fell out of her I&D wound. It continued to drain today. Not terribly tender, and no surrounding erythemia. No fever, chills.   Habits & Providers  Alcohol-Tobacco-Diet     Tobacco Status: current  Current Problems (verified): 1)  Abscess, Skin  (ICD-682.9) 2)  Boils, Recurrent  (ICD-680.9) 3)  Intertrigo  (ICD-695.89) 4)  Dental Caries  (ICD-521.00) 5)  Unspecified Renal Sclerosis  (ICD-587) 6)  Achilles Tendinitis, Bilateral  (ICD-726.71) 7)  Plantar Fasciitis, Bilateral  (ICD-728.71) 8)  Back Pain, Low  (ICD-724.2) 9)  Herniated Lumbar Disc  (ICD-722.10) 10)  Carpal Tunnel Syndrome  (ICD-354.0) 11)  Hx of Mrsa Infection  (ICD-041.19) 12)  Dermatitis, Allergic  (ICD-692.9) 13)  Constipation  (ICD-564.00) 14)  Gerd, Severe  (ICD-530.81) 15)  Hx of Gastric Ulcer Acute Without Hemorrhage  (ICD-533.30) 16)  Obesity Nos  (ICD-278.00) 17)  Tobacco Dependence  (ICD-305.1) 18)  Hypertension, Benign Systemic  (ICD-401.1) 19)  Hyperlipidemia  (ICD-272.4) 20)  Somatization Disorder  (ICD-300.81) 21)  Depression, Major, Recurrent  (ICD-296.30) 22)  Convulsions, Seizures, Nos  (ICD-780.39)  Allergies: 1)  ! Diflucan (Fluconazole)  Review of Systems  The patient denies fever, chest pain, syncope, abdominal pain, and suspicious skin lesions.    Physical Exam  General:  VS noted. Obese but well appearing woman in NAD Skin:  0.5-1 cm  opening in right upper abdominal wall. Draining purluent material. Surrounding induration.   Could not express any additional purulent material.   See procedure note. Additional Exam:  Re pack abscess. Opening was probed with a Q-tip until purulent material removed and wound started to slowly bleed.  Then 10-15 cm of packing material was interted into the wound. The wound was then covered by a bandage.  Pt tolerated the procedure well.     Impression & Recommendations:  Problem # 1:  ABSCESS, SKIN (ICD-682.9) Assessment Improved  Wound packed. No signs or symptoms of worsening or systemic infection. Red flags given.  f/u with pcp in 1 month or sooner if worsening.  Advised to quit smoking as this worsenes wound healing.  Her updated medication list for this problem includes:    Keflex 500 Mg Caps (Cephalexin) ..... One tab by mouth two times a day x 7 days  Orders: [facility]- Est Level  3 ([address]40)  Complete Medication List: 1)  Tegretol Xr 200 Mg Tb12 (Carbamazepine) .... Take one tab every morning and two tabs every night. 2)  Flexeril 10 Mg Tabs (Cyclobenzaprine hcl) .Marland Kitchen.. 1 by mouth 3x daily as needed for back pain 3)  Dilantin 100 Mg Caps (Phenytoin sodium extended) .... Take 3 capsules by mouth every morning 4)  Hydrochlorothiazide 25 Mg Tabs (Hydrochlorothiazide) .... Take 1 tab daily 5)  Effexor 75 Mg Tabs (Venlafaxine hcl) .... Take 1 tab twice daily 6)  Toprol Xl 100 Mg  Tb24 (Metoprolol succinate) .... Take one tab daily 7)  Norvasc 10 Mg Tabs (Amlodipine besylate) .... Take once daily 8)  Crestor 20 Mg Tabs (Rosuvastatin calcium) .... Take one tab daily. 9)  Naprosyn 500 Mg Tabs (Naproxen) .... One tablet two times a day as needed for severe back pain 10)  Protonix 40 Mg Pack (Pantoprazole sodium) .Marland Kitchen.. 1 by mouth once daily for heartburn 11)  Clotrimazole 1 % Crea (Clotrimazole) .... Apply to affected areas two times a day for 1 week 12)  Hydrocortisone 2.5 % Oint (Hydrocortisone)  .... Apply to affected areas once daily for 1 week 13)  Keflex 500 Mg Caps (Cephalexin) .... One tab by mouth two times a day x 7 days  Patient Instructions: 1)  Thank you for seeing me today. 2)  Remove 1inch of packing from wound daily. 3)  Keep an eye on the wound. It should drain. 4)  If the wound gets really red or you get a fever call the office. 5)  Try to stop smoking.  6)  Please see your doctor in 1 month or sooner if needed.

## 2010-08-05 NOTE — Progress Notes (Signed)
Summary: triage   Phone Note Call from Patient Call back at Home Phone [phone]   Caller: Patient Summary of Call: Pt stating pain meds not working for pain in her leg.   Initial call taken by: Clydell Hakim,  February 16, 2010 4:20 PM  Follow-up for Phone Call        her back & L leg hurt. states the naproxyn is not working. appt made for tomorrow am with Dr. Shawnie Pons Follow-up by: Golden Circle RN,  February 16, 2010 4:30 PM

## 2010-08-05 NOTE — Assessment & Plan Note (Signed)
Summary: back pain,tcb   Vital Signs:  Patient profile:   55 year old female Height:      62.0 inches Weight:      218.5 pounds BMI:     40.11 Temp:     98.5 degrees F oral Pulse rate:   114 / minute BP sitting:   145 / 92  (left arm) Cuff size:   regular  Vitals Entered By: Garen Grams LPN (Nov 11, 2009 2:37 PM) CC: back pain Pain Assessment Patient in pain? yes     Location: back Intensity: 7   Primary Care Provider:  Ancil Boozer  MD  CC:  back pain.  History of Present Illness: back pain: typical of previous back pain.  associated she reports with pain down to her calves bialterally and some weakness.  gabapentin didn't help.  out of mm relaxer and antiinflammatory.  states pain is worse in left than in right.  she denies tingling or numbness of legs or saddle area.  denies urine or stool incontinence.  Habits & Providers  Alcohol-Tobacco-Diet     Tobacco Status: current     Tobacco Counseling: to quit use of tobacco products     Cigarette Packs/Day: 0.5  Current Medications (verified): 1)  Tegretol Xr 200 Mg  Tb12 (Carbamazepine) .... Take One Tab Every Morning and Two Tabs Every Night. 2)  Flexeril 10 Mg Tabs (Cyclobenzaprine Hcl) .Marland Kitchen.. 1 By Mouth 3x Daily As Needed For Back Pain 3)  Dilantin 100 Mg  Caps (Phenytoin Sodium Extended) .... Take 3 Capsules By Mouth Every Morning 4)  Hydrochlorothiazide 25 Mg  Tabs (Hydrochlorothiazide) .... Take 1 Tab Daily 5)  Effexor 75 Mg  Tabs (Venlafaxine Hcl) .... Take 1 Tab Twice Daily 6)  Toprol Xl 100 Mg  Tb24 (Metoprolol Succinate) .... Take One Tab Daily 7)  Norvasc 10 Mg  Tabs (Amlodipine Besylate) .... Take Once Daily 8)  Crestor 20 Mg  Tabs (Rosuvastatin Calcium) .... Take One Tab Daily. 9)  Naprosyn 500 Mg Tabs (Naproxen) .... One Tablet Two Times A Day As Needed For Severe Back Pain 10)  Protonix 40 Mg Pack (Pantoprazole Sodium) .Marland Kitchen.. 1 By Mouth Once Daily For Heartburn  Allergies (verified): No Known Drug  Allergies  Past History:  Past medical, surgical, family and social histories (including risk factors) reviewed for relevance to current acute and chronic problems.  Past Medical History: Reviewed history from 04/13/2008 and no changes required. G4P3 GI :chronic constipation,  tx with Miralax by [facility] for belching/ hiatal, Gynodiol 1mg  po qd hospitalized 11/29 resistant otitis /?mastoiditis hospitalized 12/03 costochondritis adenosine CL 11/07/02-no ischemia, EF 41% UGI sm bwl follow through- 9/01 - GERD only chronic back pain - recent back and hip xrays show signs of chronic greater trochanteric bursa inflammation L>R, facet degenerative changes, bilateral mild hip degenerative changes, and also ? of DISH (diffuse idiopathic skeletal hyperostosis)  Past Surgical History: Reviewed history from 01/18/2007 and no changes required. BTL - 1991  complete hysterectomy for fibroids - 03/20/2002 kidney stenting - 03/20/2002 laproscopic cholecystectomy 1996  h/o peritonsillar abscess s/p I&D 10/99  Family History: Reviewed history from 05/25/2007 and no changes required. CHF - grandfather, DM - aunt Grandmother with Alzheimers  Social History: Reviewed history from 01/13/2009 and no changes required. poor family dynamics contributing to depressed  emotional state; medicaid;daughter recently moved out. [facility]).  new grandson adrienne malik mitchell smokes 1/2 ppd.  Review of Systems       per HPI  Physical Exam  General:  Well-developed,well-nourished,in no acute distress; alert,appropriate and cooperative throughout examination talkative in no distress Msk:  Painful upon extension of back. nontender over spine itself Good ROM of hips.  Lumbar paraspinous muscle tenderness  strength 5/5 bilateral lower extremities.  sensation intact.  DTRs symmetrical bilateral lower extermities.  pain to palpation over lateral left hip.     Impression & Recommendations:  Problem # 1:   BACK PAIN, LOW (ICD-724.2) Assessment Deteriorated resume mm relaxer and NSAID.  resume PT.  monitor symptoms closely. no red flags on exam today.  Her updated medication list for this problem includes:    Flexeril 10 Mg Tabs (Cyclobenzaprine hcl) .Marland Kitchen... 1 by mouth 3x daily as needed for back pain    Naprosyn 500 Mg Tabs (Naproxen) ..... One tablet two times a day as needed for severe back pain  Orders: Physical Therapy Referral (PT) [facility]- Est Level  3 (24401)  Problem # 2:  HYPERTENSION, BENIGN SYSTEMIC (ICD-401.1) Assessment: Deteriorated notes she has been out of some of her meds as she wait for them from medication assistance program.  she hopes they will be in soon.   Her updated medication list for this problem includes:    Hydrochlorothiazide 25 Mg Tabs (Hydrochlorothiazide) .Marland Kitchen... Take 1 tab daily    Toprol Xl 100 Mg Tb24 (Metoprolol succinate) .Marland Kitchen... Take one tab daily    Norvasc 10 Mg Tabs (Amlodipine besylate) .Marland Kitchen... Take once daily  Complete Medication List: 1)  Tegretol Xr 200 Mg Tb12 (Carbamazepine) .... Take one tab every morning and two tabs every night. 2)  Flexeril 10 Mg Tabs (Cyclobenzaprine hcl) .Marland Kitchen.. 1 by mouth 3x daily as needed for back pain 3)  Dilantin 100 Mg Caps (Phenytoin sodium extended) .... Take 3 capsules by mouth every morning 4)  Hydrochlorothiazide 25 Mg Tabs (Hydrochlorothiazide) .... Take 1 tab daily 5)  Effexor 75 Mg Tabs (Venlafaxine hcl) .... Take 1 tab twice daily 6)  Toprol Xl 100 Mg Tb24 (Metoprolol succinate) .... Take one tab daily 7)  Norvasc 10 Mg Tabs (Amlodipine besylate) .... Take once daily 8)  Crestor 20 Mg Tabs (Rosuvastatin calcium) .... Take one tab daily. 9)  Naprosyn 500 Mg Tabs (Naproxen) .... One tablet two times a day as needed for severe back pain 10)  Protonix 40 Mg Pack (Pantoprazole sodium) .Marland Kitchen.. 1 by mouth once daily for heartburn  Patient Instructions: 1)  I have sent refills of your pain medications to your pharmacy. 2)   Let's try PT again for your back - maybe they can use some of the newer techniques to help out.  3)  Be sure to get your blood pressure medicines. 4)  I'd like to see you again for follow up of your blood pressure and your back in about 3 months. Prescriptions: TOPROL XL 100 MG  TB24 (METOPROLOL SUCCINATE) take one tab daily  #100 x 1   Entered and Authorized by:   Ancil Boozer  MD   Signed by:   Ancil Boozer  MD on 11/11/2009   Method used:   Faxed to ...       [facility] Department (retail)       [address] [place], [place]  [address]       Ph: [phone]       Fax: [phone]   RxID:   [id number] HYDROCHLOROTHIAZIDE 25 MG  TABS (HYDROCHLOROTHIAZIDE) take 1 tab daily  #100 x 3   Entered and Authorized  by:   Ancil Boozer  MD   Signed by:   Ancil Boozer  MD on 11/11/2009   Method used:   Faxed to ...       [facility] Department (retail)       [address] [place], [place]  [address]       Ph: [phone]       Fax: [phone]   RxID:   [id number] NAPROSYN 500 MG TABS (NAPROXEN) One tablet two times a day as needed for severe back pain  #60 x 3   Entered and Authorized by:   Ancil Boozer  MD   Signed by:   Ancil Boozer  MD on 11/11/2009   Method used:   Faxed to ...       [facility] Department (retail)       [address] [place], [place]  [address]       Ph: [phone]       Fax: [phone]   RxID:   [id number] FLEXERIL 10 MG TABS (CYCLOBENZAPRINE HCL) 1 by mouth 3x daily as needed for back pain  #30 x 3   Entered and Authorized by:   Ancil Boozer  MD   Signed by:   Ancil Boozer  MD on 11/11/2009   Method used:   Faxed to ...       [facility] Department (retail)       [address] [place], [place]  [address]       Ph: [phone]       Fax: [phone]   RxID:   [id number] NAPROSYN 500 MG TABS (NAPROXEN) One tablet two times a day as needed for severe back  pain  #60 x 3   Entered and Authorized by:   Ancil Boozer  MD   Signed by:   Ancil Boozer  MD on 11/11/2009   Method used:   Electronically to        [facility]  [address] [phone]* (retail)       [address]       [place], [place]  [address]       Ph: [phone]       Fax: [phone]   RxID:   [id number] FLEXERIL 10 MG TABS (CYCLOBENZAPRINE HCL) 1 by mouth 3x daily as needed for back pain  #30 x 3   Entered and Authorized by:   Ancil Boozer  MD   Signed by:   Ancil Boozer  MD on 11/11/2009   Method used:   Electronically to        [facility]  [address] [phone]* (retail)       [address]       [place], [place]  [address]       Ph: [phone]       Fax: [phone]   RxID:   [id number]

## 2010-08-05 NOTE — Progress Notes (Signed)
Summary: phnmsg   Phone Note Call from Patient Call back at Home Phone [phone]   Caller: Patient Summary of Call: pt is returning call Initial call taken by: De Nurse,  February 08, 2010 4:27 PM  Follow-up for Phone Call        Patient informed of mammogram date/time. Follow-up by: Garen Grams LPN,  February 08, 2010 4:49 PM

## 2010-08-05 NOTE — Assessment & Plan Note (Signed)
Summary: not any better/see ED notes,df   Vital Signs:  Patient profile:   55 year old female Height:      62.0 inches Weight:      223.2 pounds BMI:     40.97 Temp:     98.5 degrees F oral Pulse rate:   93 / minute BP sitting:   117 / 76  (left arm) Cuff size:   regular  Vitals Entered By: Garen Grams LPN (December 28, 2009 1:37 PM) CC: rash has spread with itching Is Patient Diabetic? No Pain Assessment Patient in pain? no        Primary Care Provider:  Ancil Boozer  MD  CC:  rash has spread with itching.  History of Present Illness: 2 week history of pruritic rash under bialteral breasts and in groin.  Itchy.  Was originally more pruritic, but is less so since taking Diflucan x2 (Thu, Sun) and using topical Nystatin (out of now), both prescribed on 06/23.  Her face swelled up without oral pruritis or throat or mouth swelling and is resolving--she attributes this to a reaction to the nystatin or the diflucan.  She now also has some itching between her fingers, no contacts with similar symptoms.  No fevers.  Habits & Providers  Alcohol-Tobacco-Diet     Tobacco Status: current     Tobacco Counseling: to quit use of tobacco products  Allergies (verified): 1)  ! Diflucan (Fluconazole)  Review of Systems       Per HPI.  Physical Exam  General:  Well-developed,well-nourished,in no acute distress; alert,appropriate and cooperative throughout examination Skin:  Unchanged from prior description:  erythematous, moist, slightly macerated skin in intertriginous region of the breast, pannus and groin. No frank drainage or discharge.   Impression & Recommendations:  Problem # 1:  INTERTRIGO (EAV-409.81) Assessment Improved  Will not use Diflucan any more as patient apparently had a reaction to it.  No signs of bacterial infection. Clotrimazole topical two times a day. Hydrocortisone 2.5% topical once daily to help with itching. Benadryl as needed itching. Follow up in 4  days.  Orders: [facility]- Est Level  3 (19147)  Complete Medication List: 1)  Tegretol Xr 200 Mg Tb12 (Carbamazepine) .... Take one tab every morning and two tabs every night. 2)  Flexeril 10 Mg Tabs (Cyclobenzaprine hcl) .Marland Kitchen.. 1 by mouth 3x daily as needed for back pain 3)  Dilantin 100 Mg Caps (Phenytoin sodium extended) .... Take 3 capsules by mouth every morning 4)  Hydrochlorothiazide 25 Mg Tabs (Hydrochlorothiazide) .... Take 1 tab daily 5)  Effexor 75 Mg Tabs (Venlafaxine hcl) .... Take 1 tab twice daily 6)  Toprol Xl 100 Mg Tb24 (Metoprolol succinate) .... Take one tab daily 7)  Norvasc 10 Mg Tabs (Amlodipine besylate) .... Take once daily 8)  Crestor 20 Mg Tabs (Rosuvastatin calcium) .... Take one tab daily. 9)  Naprosyn 500 Mg Tabs (Naproxen) .... One tablet two times a day as needed for severe back pain 10)  Protonix 40 Mg Pack (Pantoprazole sodium) .Marland Kitchen.. 1 by mouth once daily for heartburn 11)  Clotrimazole 1 % Crea (Clotrimazole) .... Apply to affected areas two times a day for 1 week 12)  Hydrocortisone 2.5 % Oint (Hydrocortisone) .... Apply to affected areas once daily for 1 week  Patient Instructions: 1)  Pleasure to meet you. 2)  Use Clotrimazole two times a day for 1 week. 3)  Use Hydrocortisone once daily for 1 week. 4)  Okay to use Benadryl as needed  for itching.  Follow directions on the label (it is over the counter).  This may make you sleepy so do not drive or use machinery while taking. 5)  Call if you get worse before Friday or if you develop a fever or start swelling again. 6)  Please schedule a follow-up appointment on Friday. Prescriptions: CLOTRIMAZOLE 1 % CREA (CLOTRIMAZOLE) Apply to affected areas two times a day for 1 week  #1 x 0   Entered and Authorized by:   Romero Belling MD   Signed by:   Romero Belling MD on 12/28/2009   Method used:   Faxed to ...       [facility] Department (retail)       [address] [place], [place]  [address]        Ph: [phone]       Fax: [phone]   RxID:   [id number] HYDROCORTISONE 2.5 % OINT (HYDROCORTISONE) Apply to affected areas once daily for 1 week  #1 x 0   Entered and Authorized by:   Romero Belling MD   Signed by:   Romero Belling MD on 12/28/2009   Method used:   Faxed to ...       [facility] Department (retail)       [address] [place], [place]  [address]       Ph: [phone]       Fax: [phone]   RxID:   [id number]   Appended Document: not any better/see ED notes,df Advised to go straight to ED if she develops any tingling in her mouth or throat swelling / difficulty breathing.  Patient verbalizes understanding.

## 2010-08-05 NOTE — Assessment & Plan Note (Signed)
Summary: problem with kidney & heel pain,tcb   Vital Signs:  Patient profile:   55 year old female Height:      62.0 inches Weight:      214.9 pounds BMI:     39.45 Temp:     98.7 degrees F oral Pulse rate:   93 / minute BP sitting:   130 / 85  (left arm) Cuff size:   large  Vitals Entered By: Gladstone Pih (July 08, 2009 8:47 AM) CC: heel pain, back pain, HTN Is Patient Diabetic? No Pain Assessment Patient in pain? yes      Intensity: 8 Type: heaviness Onset of pain  legs and down to heels   Primary Care Provider:  Ancil Boozer  MD  CC:  heel pain, back pain, and HTN.  History of Present Illness: heel pain: has been going on months.  bilaterally but L/R.  did restrain back recently but didn't seem to make pain worse.  pain described as burning primarily in back of heel.  occasionally in ball of foot first thing in AM.  hurts worst going down steps.  burns essentially in disribution of a crew sock.  feels weak but primarily due to pain. has been wearing sneakers regularly and with heel cups in.  has noticed with increased pain some increase in seizures. is taking seizure meds regularly.   back pain: chronic.;  contineus to intermittently strain it 2/2 need to help move grandmother.   HTN: due for check of labs.  not having problems with meds - no dizziness, no CP, no SOB.   Habits & Providers  Alcohol-Tobacco-Diet     Tobacco Status: current     Tobacco Counseling: to quit use of tobacco products     Cigarette Packs/Day: 0.5  Current Medications (verified): 1)  Tegretol Xr 200 Mg  Tb12 (Carbamazepine) .... Take One Tab Every Morning and Two Tabs Every Night. 2)  Flexeril 10 Mg Tabs (Cyclobenzaprine Hcl) .Marland Kitchen.. 1 By Mouth 3x Daily As Needed For Back Pain 3)  Dilantin 100 Mg  Caps (Phenytoin Sodium Extended) .... Take 3 Capsules By Mouth Every Morning 4)  Hydrochlorothiazide 25 Mg  Tabs (Hydrochlorothiazide) .... Take 1 Tab Daily 5)  Effexor 75 Mg  Tabs (Venlafaxine  Hcl) .... Take 1 Tab Twice Daily 6)  Toprol Xl 100 Mg  Tb24 (Metoprolol Succinate) .... Take One Tab Daily 7)  Norvasc 10 Mg  Tabs (Amlodipine Besylate) .... Take Once Daily 8)  Trazodone Hcl 50 Mg  Tabs (Trazodone Hcl) .... Take Half of A Tab Nightly 9)  Crestor 20 Mg  Tabs (Rosuvastatin Calcium) .... Take One Tab Daily. 10)  Naprosyn 500 Mg Tabs (Naproxen) .... One Tablet Two Times A Day As Needed For Severe Back Pain 11)  Protonix 40 Mg Pack (Pantoprazole Sodium) .Marland Kitchen.. 1 By Mouth Once Daily For Heartburn 12)  Gabapentin 300 Mg Caps (Gabapentin) .... Titrate Up As Directed To Goal 2 Tabs By Mouth Three Times A Day For Nerve Pain  Allergies (verified): No Known Drug Allergies  Past History:  Past medical, surgical, family and social histories (including risk factors) reviewed for relevance to current acute and chronic problems.  Past Medical History: Reviewed history from 04/13/2008 and no changes required. G4P3 GI :chronic constipation,  tx with Miralax by 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 of DISH (diffuse idiopathic skeletal hyperostosis)  Past Surgical History: Reviewed history from 01/18/2007 and no changes required. BTL - 1991  complete hysterectomy for fibroids - 03/20/2002 kidney stenting - 03/20/2002 laproscopic cholecystectomy 1996  h/o peritonsillar abscess s/p I&D 10/99  Family History: Reviewed history from 05/25/2007 and no changes required. CHF - grandfather, DM - aunt Grandmother with Alzheimers  Social History: Reviewed history from 01/13/2009 and no changes required. poor family dynamics  contributing to depressed  emotional state; medicaid;daughter recently moved out. [facility]).  new grandson adrienne malik mitchell smokes 1/2 ppd.  Review of Systems       per HPI. denies loss of bowel or bladder.  denies abdominal pain.  denies excess fatigue.  Physical Exam  General:  Well-developed,well-nourished,in no acute distress; alert,appropriate and cooperative throughout examination talkative in no distress Lungs:  Normal respiratory effort, chest expands symmetrically. Lungs are clear to auscultation, no crackles or wheezes. Heart:  Normal rate and regular rhythm. S1 and S2 normal without gallop, murmur, click, rub or other extra sounds. Msk:  Painful upon extension of back. Tender over the spine in lower thoracic spine. Good ROM of hips.  Lumbar paraspinous muscle tenderness  achilles intact bilaterally.  mild pain to palpation.  no pain over insertion of plantar fascia.  strenghth 5/5 bilateral lower extremities.  seasation intact.    Impression & Recommendations:  Problem # 1:  NEUROPATHY (ICD-355.9) Assessment New  unclear etiology.  ? if related to back problems that are known.  try gabapentin to see if helps, consider arch supports in shoes.  monitor improvement.   Orders: [facility]- Est  Level 4 (16109)  Problem # 2:  BACK PAIN, LOW (ICD-724.2) Assessment: Unchanged  no changes.  continue to modify activity as she can.    The following medications were removed from the medication list:    Tylenol With Codeine #3 300-30 Mg Tabs (Acetaminophen-codeine) .Marland Kitchen... 1 by mouth three times a day as needed severe pain. Her updated medication list for this problem includes:    Flexeril 10 Mg Tabs (Cyclobenzaprine hcl) .Marland Kitchen... 1 by mouth 3x daily as needed for back pain    Naprosyn 500 Mg Tabs (Naproxen) ..... One tablet two times a day as needed for severe back pain  Orders: [facility]- Est  Level 4 (60454)  Problem # 3:  HYPERTENSION, BENIGN SYSTEMIC  (ICD-401.1) Assessment: Unchanged at goal but due for lab check   Her updated medication list for this problem includes:    Hydrochlorothiazide 25 Mg Tabs (Hydrochlorothiazide) .Marland Kitchen... Take 1 tab daily    Toprol Xl 100 Mg Tb24 (Metoprolol succinate) .Marland Kitchen... Take one tab daily    Norvasc 10 Mg Tabs (Amlodipine besylate) .Marland Kitchen... Take once daily  Orders: Comp Met-[facility] [id number]) Lipid-[facility] (29562-13086) CBC-[facility] (57846) [facility]- Est  Level 4 (96295)  Problem # 4:  CONVULSIONS, SEIZURES, NOS (ICD-780.39) Assessment: Deteriorated due for level check partiularly in light of recent increase in seizure activity  Her updated medication list for this problem includes:    Tegretol Xr 200 Mg Tb12 (Carbamazepine) .Marland Kitchen... Take one tab every morning and two tabs every night.    Dilantin 100 Mg Caps (Phenytoin sodium extended) .Marland Kitchen... Take 3 capsules by mouth every morning    Gabapentin 300 Mg Caps (Gabapentin) .Marland Kitchen... Titrate up as directed to goal 2 tabs by mouth three times a day  for nerve pain  Orders: Miscellaneous Lab Charge-[facility] (16109) [facility]- Est  Level 4 (60454)  Complete Medication List: 1)  Tegretol Xr 200 Mg Tb12 (Carbamazepine) .... Take one tab every morning and two tabs every night. 2)  Flexeril 10 Mg Tabs (Cyclobenzaprine hcl) .Marland Kitchen.. 1 by mouth 3x daily as needed for back pain 3)  Dilantin 100 Mg Caps (Phenytoin sodium extended) .... Take 3 capsules by mouth every morning 4)  Hydrochlorothiazide 25 Mg Tabs (Hydrochlorothiazide) .... Take 1 tab daily 5)  Effexor 75 Mg Tabs (Venlafaxine hcl) .... Take 1 tab twice daily 6)  Toprol Xl 100 Mg Tb24 (Metoprolol succinate) .... Take one tab daily 7)  Norvasc 10 Mg Tabs (Amlodipine besylate) .... Take once daily 8)  Trazodone Hcl 50 Mg Tabs (Trazodone hcl) .... Take half of a tab nightly 9)  Crestor 20 Mg Tabs (Rosuvastatin calcium) .... Take one tab daily. 10)  Naprosyn 500 Mg Tabs (Naproxen) .... One tablet two times a day as needed for severe back  pain 11)  Protonix 40 Mg Pack (Pantoprazole sodium) .Marland Kitchen.. 1 by mouth once daily for heartburn 12)  Gabapentin 300 Mg Caps (Gabapentin) .... Titrate up as directed to goal 2 tabs by mouth three times a day for nerve pain  Patient Instructions: 1)  We are going to check some labs today. 2)  I am going to start you on a medicine that can help with nerve type pain.  I'm not sure we are going to find out for sure the cause of your pain but we will see if this helps.  3)  Please follow up once the medicine has had time to work to see how your pain is doing and to follow up on your blood pressure, seizures, etc. Prescriptions: GABAPENTIN 300 MG CAPS (GABAPENTIN) titrate up as directed to goal 2 tabs by mouth three times a day for nerve pain  #180 x 3   Entered and Authorized by:   Ancil Boozer  MD   Signed by:   Ancil Boozer  MD on 07/08/2009   Method used:   Print then Give to Patient   RxID:   [id number] LYRICA 75 MG CAPS (PREGABALIN) 1 by mouth at bedtime for 2 days then 1 by mouth two times a day.  #60 x 6   Entered and Authorized by:   Ancil Boozer  MD   Signed by:   Ancil Boozer  MD on 07/08/2009   Method used:   Print then Give to Patient   RxID:   [id number]   Prevention & Chronic Care Immunizations   Influenza vaccine: Fluvax Non-MCR  (04/08/2008)   Influenza vaccine due: 04/08/2009    Tetanus booster: 06/13/2008: Tdap   Tetanus booster due: 06/13/2018    Pneumococcal vaccine: Not documented  Colorectal Screening   Hemoccult: Not documented   Hemoccult due: Not Indicated    Colonoscopy: Location:  [facility].    (04/18/2006)   Colonoscopy due: 04/18/2016  Other Screening   Pap smear: Done.  (03/04/2002)   Pap smear due: 03/04/2005    Mammogram: Normal  (07/04/2008)   Mammogram due: 07/2009   Smoking status: current  (07/08/2009)   Smoking cessation counseling: yes  (06/13/2008)  Lipids   Total Cholesterol: 160  (06/02/2008)   LDL: 74   (06/02/2008)   LDL Direct: Not documented   HDL: 51  (06/02/2008)   Triglycerides: 175  (06/02/2008)    SGOT (AST): 30  (06/14/2007)   SGPT (ALT): 27  (  06/14/2007) CMP ordered    Alkaline phosphatase: 147  (06/14/2007)   Total bilirubin: 0.3  (06/14/2007)    Lipid flowsheet reviewed?: Yes   Progress toward LDL goal: At goal  Hypertension   Last Blood Pressure: 130 / 85  (07/08/2009)   Serum creatinine: 0.86  (06/02/2008)   Serum potassium 3.9  (06/02/2008) CMP ordered     Hypertension flowsheet reviewed?: Yes   Progress toward BP goal: At goal  Self-Management Support :   Personal Goals (by the next clinic visit) :      Personal blood pressure goal: 140/90  (07/08/2009)     Personal LDL goal: 130  (07/08/2009)    Hypertension self-management support: Not documented    Hypertension self-management support not done because: Good outcomes  (07/08/2009)    Lipid self-management support: Not documented     Lipid self-management support not done because: Good outcomes  (07/08/2009)

## 2010-08-05 NOTE — Assessment & Plan Note (Signed)
Summary: f/u per olson/eo   Vital Signs:  Patient profile:   55 year old female Height:      62.0 inches Weight:      221 pounds BMI:     40.57 BSA:     2.00 Temp:     99.3 degrees F Pulse rate:   98 / minute BP sitting:   134 / 92  Vitals Entered By: Jone Baseman CMA (January 01, 2010 9:15 AM) CC: f/u per olson Is Patient Diabetic? No Pain Assessment Patient in pain? yes     Location: all over Intensity: 3   Primary Care Provider:  Ancil Boozer  MD  CC:  f/u per olson.  History of Present Illness: 1) Intertrigo: Last seen on 6/27 for two week history of intertrigo - initially treated with topical Nystatin and oral Diflucan on 6/23 (to which she had an allergic reaction w/ facial angioedema - see ER notes), switched to clotrimazole with improvement. Still mildly itchy occasionally, but rash has resolved. Working on keeping areas dry.   2) Boils: 2 day history of several small "boils" on abdomen, one larger boil on left labium majora. Painful. Start off as small "pimples". Some mild purulent drainage from one boil on abdomen. Reports prior history of boils 20 years ago after she had her son.  No personal history of diabetes. Denies contacts with similar symptoms, fever, chills, nausea, emesis, diarrhea.   see PRIOR meds for med rec  Habits & Providers  Alcohol-Tobacco-Diet     Tobacco Status: current     Tobacco Counseling: to quit use of tobacco products     Cigarette Packs/Day: 0.5  Medications Prior to Update: 1)  Tegretol Xr 200 Mg  Tb12 (Carbamazepine) .... Take One Tab Every Morning and Two Tabs Every Night. 2)  Flexeril 10 Mg Tabs (Cyclobenzaprine Hcl) .Marland Kitchen.. 1 By Mouth 3x Daily As Needed For Back Pain 3)  Dilantin 100 Mg  Caps (Phenytoin Sodium Extended) .... Take 3 Capsules By Mouth Every Morning 4)  Hydrochlorothiazide 25 Mg  Tabs (Hydrochlorothiazide) .... Take 1 Tab Daily 5)  Effexor 75 Mg  Tabs (Venlafaxine Hcl) .... Take 1 Tab Twice Daily 6)  Toprol Xl 100  Mg  Tb24 (Metoprolol Succinate) .... Take One Tab Daily 7)  Norvasc 10 Mg  Tabs (Amlodipine Besylate) .... Take Once Daily 8)  Crestor 20 Mg  Tabs (Rosuvastatin Calcium) .... Take One Tab Daily. 9)  Naprosyn 500 Mg Tabs (Naproxen) .... One Tablet Two Times A Day As Needed For Severe Back Pain 10)  Protonix 40 Mg Pack (Pantoprazole Sodium) .Marland Kitchen.. 1 By Mouth Once Daily For Heartburn 11)  Clotrimazole 1 % Crea (Clotrimazole) .... Apply To Affected Areas Two Times A Day For 1 Week 12)  Hydrocortisone 2.5 % Oint (Hydrocortisone) .... Apply To Affected Areas Once Daily For 1 Week  Allergies (verified): 1)  ! Diflucan (Fluconazole)  Review of Systems       as per HPI o/w negative   Physical Exam  General:  obese, NAD, vitals reviewed Mouth:  no oral lesions  Genitalia:  see skin exam  Skin:  - Five pustules with surrounding erythema scattered over abdomen (no particular pattern), the largest of which is 0.8 mm x 0.8 mm with scant purulent drainage w/o induration  - 2 cm x 2 cm area of induration, mild erythema, w/o fluctuance at left labium majora, tender to palpation.   Areas of erythematous, moist, slightly macerated skin in intertriginous region of the breast,  pannus and groin have RESOLVED.     Impression & Recommendations:  Problem # 1:  INTERTRIGO (UEA-540.98) Assessment Improved  Advised to continue Clotrimazole topical two times a day through the weekend. Advised regarding hygiene, keeping intertriginous areas clean and dry.   Orders: [facility]- Est Level  3 (11914)  Problem # 2:  BOILS, RECURRENT (ICD-680.9) Assessment: New  Uncertain cause. Mild. No draianble abscesses. Likely history of recurrent boils. Will treat with Keflex. Follow up if not improving. Reviewed blood glucose levels  - no signs of glucose intolerance or DM2.   Orders: [facility]- Est Level  3 (78295)  Complete Medication List: 1)  Tegretol Xr 200 Mg Tb12 (Carbamazepine) .... Take one tab every morning and two tabs  every night. 2)  Flexeril 10 Mg Tabs (Cyclobenzaprine hcl) .Marland Kitchen.. 1 by mouth 3x daily as needed for back pain 3)  Dilantin 100 Mg Caps (Phenytoin sodium extended) .... Take 3 capsules by mouth every morning 4)  Hydrochlorothiazide 25 Mg Tabs (Hydrochlorothiazide) .... Take 1 tab daily 5)  Effexor 75 Mg Tabs (Venlafaxine hcl) .... Take 1 tab twice daily 6)  Toprol Xl 100 Mg Tb24 (Metoprolol succinate) .... Take one tab daily 7)  Norvasc 10 Mg Tabs (Amlodipine besylate) .... Take once daily 8)  Crestor 20 Mg Tabs (Rosuvastatin calcium) .... Take one tab daily. 9)  Naprosyn 500 Mg Tabs (Naproxen) .... One tablet two times a day as needed for severe back pain 10)  Protonix 40 Mg Pack (Pantoprazole sodium) .Marland Kitchen.. 1 by mouth once daily for heartburn 11)  Clotrimazole 1 % Crea (Clotrimazole) .... Apply to affected areas two times a day for 1 week 12)  Hydrocortisone 2.5 % Oint (Hydrocortisone) .... Apply to affected areas once daily for 1 week 13)  Keflex 500 Mg Caps (Cephalexin) .... One tab by mouth two times a day x 7 days  Patient Instructions: 1)  Take antibiotic as directed. 2)  Follow up in one week if not improving 3)  Continue to use antifungal cream.  4)  You can use warm compresses on these areas  Prescriptions: KEFLEX 500 MG CAPS (CEPHALEXIN) one tab by mouth two times a day x 7 days  #14 x 0   Entered and Authorized by:   Bobby Rumpf  MD   Signed by:   Bobby Rumpf  MD on 01/01/2010   Method used:   Print then Give to Patient   RxID:   [id number]

## 2010-08-05 NOTE — Progress Notes (Signed)
Summary: Rx Prob   Phone Note Call from Patient Call back at Home Phone [phone]   Caller: Patient Summary of Call: Pt took rx to the [facility] and they said that the Dr. had to go on to the [employer] site to order the rx for the pt.  Lyrica is the rx. Initial call taken by: Clydell Hakim,  July 08, 2009 10:09 AM  Follow-up for Phone Call        attempted calling [place]. Had to leave message to call back. Follow-up by: Theresia Lo RN,  July 08, 2009 10:23 AM  Additional Follow-up for Phone Call Additional follow up Details #1::        Spoke with pharmacist. The health dept pharmacy does not have Lyrica. they told patient she could go online to see if there is any patient assistance program that might help her get this medication.  will send  message to MD. Additional Follow-up by: Theresia Lo RN,  July 08, 2009 11:20 AM    Additional Follow-up for Phone Call Additional follow up Details #2::    she is already a part of the MAP program.  can they help with this?  Follow-up by: Ancil Boozer  MD,  July 08, 2009 11:37 AM  Additional Follow-up for Phone Call Additional follow up Details #3:: Details for Additional Follow-up Action Taken: No, they do not provide this service . It is up to the patient or doctor to find out if there is   patient assistance from a drug company for this medication.  Additional Follow-up by: Theresia Lo RN,  July 08, 2009 12:00 PM   will change to gabapentin given this information.  have the patient titrate up the gabapentin as below: 1 tab by mouth at bedtime on day 1 1 tab two times a day on day 2 then 1 tab three times a day. stay on this 1 week - if going well then increase as follows 1 tab at breakfast and lunch and 2 tabs a bedtime for 1 day 2 tabs at breakfast, 1 at lunch and 2 at bedtime for 1 day  then 2 tabs three times a day thereafter until follow up.   gapapentin script is in  previous document from this AM and can be called to [facility]. thanks. Ancil Boozer  MD  July 08, 2009 12:27 PM    RX called to [facility]. Pharmacist wrote down titration scheduled to give to patient. Patient notified and RN  went over the titration with her also.  Advised to follow up with MD after she has built up to two caps three times a day.

## 2010-08-05 NOTE — Assessment & Plan Note (Signed)
Summary: lump in breast/[place]   Vital Signs:  Patient profile:   55 year old female Height:      62.0 inches Weight:      219 pounds BMI:     40.20 Temp:     98.2 degrees F oral Pulse rate:   78 / minute BP sitting:   139 / 89  (right arm)  Vitals Entered By: Jimmy Footman, CMA (January 25, 2010 10:23 AM) CC: lump in rt breast found yesterday, boil around navel area Is Patient Diabetic? No   Primary Care Provider:  Alvia Grove DO  CC:  lump in rt breast found yesterday and boil around navel area.  History of Present Illness: 1. Lump in breast - Noticed it since yesterday - It is underneath the right breast - It is pretty big (about 2x2inches) - It is painful to touch - It is not draining - It is red and warm  2. Boil around navel - Has been there for a couple of days - It is red, warm, and tender - Located around the navel - It is not draining  PMHx: hx of recurrent boils.  Hx of MRSA  ROS: denies fevers, rash, malaise, chills, n/v   Habits & Providers  Alcohol-Tobacco-Diet     Tobacco Status: current  Allergies: 1)  ! Diflucan (Fluconazole)  Social History: Reviewed history from 12/16/2009 and no changes required. poor family dynamics contributing to depressed  emotional state; medicaid;daughter recently moved out. [facility]).  new grandson adrienne malik mitchell smokes 1/2 ppd. financial strain  Physical Exam  General:  Vitals reviewed.  Sitting comfortably, no acute distress Breasts:  R breast: 3x3cm firm, tender mass at 6:00 position.  It is red and warm.  No areas of fluctuance.  No nipple discharge.  No other masses appreciated. L breast normal. Lungs:  normal respiratory effort.   Heart:  normal rate and regular rhythm.   Abdomen:  soft and non-tender.   Skin:  Small 1cmx1cm area of cellulitis / abscess just above navel.  It is TTP.  No drainage.  Firm.  No fluctuance.   Impression & Recommendations:  Problem # 1:  BOILS, RECURRENT  (ICD-680.9) Assessment New  Abscess under right breast and near navel.  No areas of fluctuance that could be drained at this point.  Advised her to take antibiotics and use warm compresses.  Given that they are recurrent, they are likely MRSA.  Will treat with Bactrim.  Will also put her through decolonization protocol with bleach baths and Bactroban nasal.  Orders: [facility]- Est Level  3 ([address])  Complete Medication List: 1)  Tegretol Xr 200 Mg Tb12 (Carbamazepine) .... Take one tab every morning and two tabs every night. 2)  Flexeril 10 Mg Tabs (Cyclobenzaprine hcl) .Marland Kitchen.. 1 by mouth 3x daily as needed for back pain 3)  Dilantin 100 Mg Caps (Phenytoin sodium extended) .... Take 3 capsules by mouth every morning 4)  Hydrochlorothiazide 25 Mg Tabs (Hydrochlorothiazide) .... Take 1 tab daily 5)  Effexor 75 Mg Tabs (Venlafaxine hcl) .... Take 1 tab twice daily 6)  Toprol Xl 100 Mg Tb24 (Metoprolol succinate) .... Take one tab daily 7)  Norvasc 10 Mg Tabs (Amlodipine besylate) .... Take once daily 8)  Crestor 20 Mg Tabs (Rosuvastatin calcium) .... Take one tab daily. 9)  Naprosyn 500 Mg Tabs (Naproxen) .... One tablet two times a day as needed for severe back pain 10)  Protonix 40 Mg Pack (Pantoprazole sodium) .Marland Kitchen.. 1 by  mouth once daily for heartburn 11)  Clotrimazole 1 % Crea (Clotrimazole) .... Apply to affected areas two times a day for 1 week 12)  Hydrocortisone 2.5 % Oint (Hydrocortisone) .... Apply to affected areas once daily for 1 week 13)  Bactrim Ds 800-160 Mg Tabs (Sulfamethoxazole-trimethoprim) .Marland Kitchen.. 1 tab by mouth twice a day for 10 days 14)  Bactroban 2 % Oint (Mupirocin) .... Apply small amount to each nostril 3 times per day for 5 days  Other Orders: Mammogram (Screening) (Mammo)  Patient Instructions: 1)  I think that you have to boils. 2)  They do not feel like they could be drained 3)  For now lets treat them with antibiotics and warm compresses. 4)  If they get to the point  where they start to drain we may need to cut them open. 5)  I also think that we need to get all of the bacteria off of the skin.  We can do that will bleach baths and Bactroban to the nose. 6)  Do the bleach baths everyday for 2 weeks. 7)  Also put Bactroban in your nose 3 times a day for 5 days 8)  Please schedule a follow up appointment in 1 week to recheck your boils Prescriptions: BACTROBAN 2 % OINT (MUPIROCIN) Apply small amount to each nostril 3 times per day for 5 days  #1 x 0   Entered and Authorized by:   Angelena Sole MD   Signed by:   Angelena Sole MD on 01/25/2010   Method used:   Electronically to        [facility] [phone]* (retail)       [address]       [place], [place]  [address]       Ph: [phone]       Fax: [phone]   RxID:   [id number] BACTRIM DS 800-160 MG TABS (SULFAMETHOXAZOLE-TRIMETHOPRIM) 1 tab by mouth twice a day for 10 days  #20 x 0   Entered and Authorized by:   Angelena Sole MD   Signed by:   Angelena Sole MD on 01/25/2010   Method used:   Electronically to        [facility] [phone]* (retail)       [address]       [place], [place]  [address]       Ph: [phone]       Fax: [phone]   RxID:   [id number]   Prevention & Chronic Care Immunizations   Influenza vaccine: Fluvax Non-MCR  (04/08/2008)   Influenza vaccine due: 04/08/2009    Tetanus booster: 06/13/2008: Tdap   Tetanus booster due: 06/13/2018    Pneumococcal vaccine: Not documented  Colorectal Screening   Hemoccult: Not documented   Hemoccult due: Not Indicated    Colonoscopy: Location:  [facility].    (04/18/2006)   Colonoscopy due: 04/18/2016  Other Screening   Pap smear: Done.  (03/04/2002)   Pap smear due: 03/04/2005    Mammogram: Normal  (07/04/2008)   Mammogram action/deferral: Ordered  (01/25/2010)   Mammogram due: 07/2009   Smoking status: current  (01/25/2010)   Smoking cessation counseling: yes   (06/13/2008)  Lipids   Total Cholesterol: 181  (07/08/2009)   LDL: 91  (07/08/2009)   LDL Direct: Not documented   HDL: 58  (07/08/2009)   Triglycerides: 162  (07/08/2009)    SGOT (AST): 19  (07/08/2009)   SGPT (ALT): 21  (07/08/2009)   Alkaline  phosphatase: 134  (07/08/2009)   Total bilirubin: 0.2  (07/08/2009)  Hypertension   Last Blood Pressure: 139 / 89  (01/25/2010)   Serum creatinine: 0.90  (07/08/2009)   Serum potassium 4.8  (07/08/2009)  Self-Management Support :   Personal Goals (by the next clinic visit) :      Personal blood pressure goal: 140/90  (07/08/2009)     Personal LDL goal: 130  (07/08/2009)    Hypertension self-management support: Not documented    Hypertension self-management support not done because: Good outcomes  (07/08/2009)    Lipid self-management support: Not documented     Lipid self-management support not done because: Good outcomes  (07/08/2009)   Nursing Instructions: Schedule screening mammogram (see order)

## 2010-08-05 NOTE — Progress Notes (Signed)
Summary: triage   Phone Note Call from Patient Call back at [phone]   Caller: Patient Summary of Call: pt has a [place] appt for 9:45 am but wants to know what she can do in the mean time for nausea & Headache  Initial call taken by: De Nurse,  Nov 25, 2009 1:43 PM  Follow-up for Phone Call        has tried ibu for the [facility] no relief. also tried Geneticist, molecular. pain is 8/10. pain under r breast . worse with movement. c/o gas.states she is belching a lot. sent to UC & cancelled appt tomorrow. she agred Follow-up by: Golden Circle RN,  Nov 25, 2009 1:45 PM

## 2010-08-05 NOTE — Assessment & Plan Note (Signed)
Summary: F/U I&D Abscess   Vital Signs:  Patient profile:   55 year old female Weight:      218 pounds Temp:     98.2 degrees F oral Pulse rate:   84 / minute BP sitting:   127 / 82  (left arm) Cuff size:   regular  Vitals Entered By: Arlyss Repress CMA, (January 08, 2010 2:52 PM) CC: f/up I & D abscess right abdomen. no pain. feels much better. Is Patient Diabetic? No Pain Assessment Patient in pain? no        Primary Provider:  Ancil Boozer  MD  CC:  f/up I & D abscess right abdomen. no pain. feels much better.Marland Kitchen  History of Present Illness: 1. Patient returning for recheck of I&D-  I&D done yesterday, back today to have wound rechecked.  Pain and swelling is decreased.  Not having fevers, chills, nausea, vomiting, headache. Still taking keflex.  Habits & Providers  Alcohol-Tobacco-Diet     Tobacco Status: current     Tobacco Counseling: to quit use of tobacco products  Comments: not ready to quitt smoking at this time.  Current Medications (verified): 1)  Tegretol Xr 200 Mg  Tb12 (Carbamazepine) .... Take One Tab Every Morning and Two Tabs Every Night. 2)  Flexeril 10 Mg Tabs (Cyclobenzaprine Hcl) .Marland Kitchen.. 1 By Mouth 3x Daily As Needed For Back Pain 3)  Dilantin 100 Mg  Caps (Phenytoin Sodium Extended) .... Take 3 Capsules By Mouth Every Morning 4)  Hydrochlorothiazide 25 Mg  Tabs (Hydrochlorothiazide) .... Take 1 Tab Daily 5)  Effexor 75 Mg  Tabs (Venlafaxine Hcl) .... Take 1 Tab Twice Daily 6)  Toprol Xl 100 Mg  Tb24 (Metoprolol Succinate) .... Take One Tab Daily 7)  Norvasc 10 Mg  Tabs (Amlodipine Besylate) .... Take Once Daily 8)  Crestor 20 Mg  Tabs (Rosuvastatin Calcium) .... Take One Tab Daily. 9)  Naprosyn 500 Mg Tabs (Naproxen) .... One Tablet Two Times A Day As Needed For Severe Back Pain 10)  Protonix 40 Mg Pack (Pantoprazole Sodium) .Marland Kitchen.. 1 By Mouth Once Daily For Heartburn 11)  Clotrimazole 1 % Crea (Clotrimazole) .... Apply To Affected Areas Two Times A Day For  1 Week 12)  Hydrocortisone 2.5 % Oint (Hydrocortisone) .... Apply To Affected Areas Once Daily For 1 Week 13)  Keflex 500 Mg Caps (Cephalexin) .... One Tab By Mouth Two Times A Day X 7 Days  Allergies (verified): 1)  ! Diflucan (Fluconazole)  Review of Systems       Pertinent positives and negatives noted in HPI, Vitals signs noted   Physical Exam  General:  alert, well-developed, and well-nourished, nad Skin:  Abscess still with minimal purulent drainage.  Could not express any additional purulent material.  Less indurated and erythematous today.  Has improved.   Impression & Recommendations:  Problem # 1:  ABSCESS, SKIN (ICD-682.9)  Induration and erythema improved.  Packing removed and wound repacked.  Instructed to pull packing out little by little every couple of days.  Advised her of red flags on when to return.  Continue on Keflex.  Her updated medication list for this problem includes:    Keflex 500 Mg Caps (Cephalexin) ..... One tab by mouth two times a day x 7 days  Orders: [facility]- Est Level  2 ([address])  Complete Medication List: 1)  Tegretol Xr 200 Mg Tb12 (Carbamazepine) .... Take one tab every morning and two tabs every night. 2)  Flexeril 10 Mg Tabs (  Cyclobenzaprine hcl) .Marland Kitchen.. 1 by mouth 3x daily as needed for back pain 3)  Dilantin 100 Mg Caps (Phenytoin sodium extended) .... Take 3 capsules by mouth every morning 4)  Hydrochlorothiazide 25 Mg Tabs (Hydrochlorothiazide) .... Take 1 tab daily 5)  Effexor 75 Mg Tabs (Venlafaxine hcl) .... Take 1 tab twice daily 6)  Toprol Xl 100 Mg Tb24 (Metoprolol succinate) .... Take one tab daily 7)  Norvasc 10 Mg Tabs (Amlodipine besylate) .... Take once daily 8)  Crestor 20 Mg Tabs (Rosuvastatin calcium) .... Take one tab daily. 9)  Naprosyn 500 Mg Tabs (Naproxen) .... One tablet two times a day as needed for severe back pain 10)  Protonix 40 Mg Pack (Pantoprazole sodium) .Marland Kitchen.. 1 by mouth once daily for heartburn 11)  Clotrimazole  1 % Crea (Clotrimazole) .... Apply to affected areas two times a day for 1 week 12)  Hydrocortisone 2.5 % Oint (Hydrocortisone) .... Apply to affected areas once daily for 1 week 13)  Keflex 500 Mg Caps (Cephalexin) .... One tab by mouth two times a day x 7 days  Patient Instructions: 1)  Continue your antibiotic until it is all gone 2)  Starting on Sunday begin to pull some of the packing out about 1/2-1 inch at a time.  Repeat every 48 hours, until there is no packing left.  If you feel like the area is getting worse again please call us as soon as possible.

## 2010-08-05 NOTE — Progress Notes (Signed)
Summary: triage   Phone Note Call from Patient Call back at [phone]   Caller: Patient Summary of Call: has a rash the bottom part of breast and groin area - nothing she does helps it.  would like to come in. Initial call taken by: De Nurse,  December 23, 2009 10:55 AM  Follow-up for Phone Call        rash x 2 wks. thought it was from heat. very itchy. skin "raw" states it is getting worse. using neosporin on areas.  appt made for 11am work in tomorrow. no appt today & she refuses to go to UC due to previous visits being unhelpful. advised use of cornstarch under breasts & in folds of skin at groin. told her to wear a skirt, not pants and go braless. has a/c in home.  Follow-up by: Golden Circle RN,  December 23, 2009 11:07 AM     agree

## 2010-08-05 NOTE — Progress Notes (Signed)
Summary: triage   Phone Note Call from Patient Call back at Home Phone [phone]   Caller: Patient Summary of Call: needs to talk to nurse about a bump on her side Initial call taken by: De Nurse,  January 07, 2010 12:19 PM  Follow-up for Phone Call        boil at r side of waist. states it is very large & painful. has been using hot washcloths to area.  will see Dr. Ashley Royalty at 3pm today Follow-up by: Golden Circle RN,  January 07, 2010 12:20 PM

## 2010-08-05 NOTE — Assessment & Plan Note (Signed)
Summary: I&D abscess R abdomen   Vital Signs:  Patient profile:   55 year old female Weight:      218.3 pounds BMI:     40.07 Temp:     99 degrees F oral Pulse rate:   99 / minute BP sitting:   126 / 81  (left arm) Cuff size:   regular  Vitals Entered By: Jimmy Footman, CMA (January 07, 2010 3:20 PM) CC: Boil located on right side of waistline x3 days. Is Patient Diabetic? No Pain Assessment Patient in pain? yes     Location: waistline Intensity: 10 Type: sharp   Primary Provider:  Ancil Boozer  MD  CC:  Boil located on right side of waistline x3 days.Marland Kitchen  History of Present Illness: 1. Boil- Pt. with complaint of boil along waistline on right side.  Has been present for 3 days.  Area started as small pimple but has been getting larger over the past few days.  Woke up this morning and had sharp burning pain from area.  Also area much larger with increased warmth.  There has been no drainage from the area.  She has been  applying warm compresses, with little help.  Has had these areas in the past, most recently seen by Dr. Constance Goltz on 01/01/10 and put on Keflex.  Did not start Keflex until yesterday.  Has had increased malaise.  Denies fever, chills, nausea, vomiting, headache.  Habits & Providers  Alcohol-Tobacco-Diet     Tobacco Status: current     Cigarette Packs/Day: 0.25  Current Medications (verified): 1)  Tegretol Xr 200 Mg  Tb12 (Carbamazepine) .... Take One Tab Every Morning and Two Tabs Every Night. 2)  Flexeril 10 Mg Tabs (Cyclobenzaprine Hcl) .Marland Kitchen.. 1 By Mouth 3x Daily As Needed For Back Pain 3)  Dilantin 100 Mg  Caps (Phenytoin Sodium Extended) .... Take 3 Capsules By Mouth Every Morning 4)  Hydrochlorothiazide 25 Mg  Tabs (Hydrochlorothiazide) .... Take 1 Tab Daily 5)  Effexor 75 Mg  Tabs (Venlafaxine Hcl) .... Take 1 Tab Twice Daily 6)  Toprol Xl 100 Mg  Tb24 (Metoprolol Succinate) .... Take One Tab Daily 7)  Norvasc 10 Mg  Tabs (Amlodipine Besylate) .... Take Once  Daily 8)  Crestor 20 Mg  Tabs (Rosuvastatin Calcium) .... Take One Tab Daily. 9)  Naprosyn 500 Mg Tabs (Naproxen) .... One Tablet Two Times A Day As Needed For Severe Back Pain 10)  Protonix 40 Mg Pack (Pantoprazole Sodium) .Marland Kitchen.. 1 By Mouth Once Daily For Heartburn 11)  Clotrimazole 1 % Crea (Clotrimazole) .... Apply To Affected Areas Two Times A Day For 1 Week 12)  Hydrocortisone 2.5 % Oint (Hydrocortisone) .... Apply To Affected Areas Once Daily For 1 Week 13)  Keflex 500 Mg Caps (Cephalexin) .... One Tab By Mouth Two Times A Day X 7 Days  Allergies (verified): 1)  ! Diflucan (Fluconazole)  Social History: Packs/Day:  0.25  Review of Systems       Pertinent positives and negatives noted in HPI, Vitals signs noted   Physical Exam  General:  Obese african [place] female in nad Lungs:  normal respiratory effort and normal breath sounds.   Heart:  normal rate, regular rhythm, no murmur, no gallop, and no rub.   Skin:  Large area of induration on lower right abdomen.  Area is erythematous and fluctuant with pustule in middle of indurated area.  Area of induration  ~4-5cm, with erythema extending past that.  Area is tender  and warm to touch.  Multiple other smaller pustules in axillae and opposite abdomen, non-fluctuant with no induration   Impression & Recommendations:  Problem # 1:  ABSCESS, SKIN (ICD-682.9)  Patient with abscess of right lower abdomen.  Currently on Keflex, started yesterday.  I&D Performed today.  Will see patient back tomorrow for re-check and repacking of wound  Procedure note: Consent obtained Time out performed at 1545 patient identified by name.  Procedure identified as I&D of abscess.  Location Identified at lower right abdomen. Area prepped with alcohol swabs and injected with 7cc's of lidocaine with epinephrine After providing adequate anesthesia, area was re-prepped aseptically with povidone-iodine solution. A 1-2cm incision was the made through the  middle of the indurated area along the location of the pustule. After incision using gentle pressure, contents of abscess were expressed.  A pair of hemostats was inserted into the wound to break up loculations.  The contents were expressed until there was no additional pus from the wound.  The wound was thenn packed with iodoform gauze to allow for healing by secondary intent.   A cleann dressing of 4x4 gauze was placed over top and taped into place There were no complications during the procedure There was minimal blood loss during the procedure. Her updated medication list for this problem includes:    Keflex 500 Mg Caps (Cephalexin) ..... One tab by mouth two times a day x 7 days  Orders: [facility]- Est Level  3 ([address]) I&D Abcess, simple- [facility] (100[address])  Complete Medication List: 1)  Tegretol Xr 200 Mg Tb12 (Carbamazepine) .... Take one tab every morning and two tabs every night. 2)  Flexeril 10 Mg Tabs (Cyclobenzaprine hcl) .Marland Kitchen.. 1 by mouth 3x daily as needed for back pain 3)  Dilantin 100 Mg Caps (Phenytoin sodium extended) .... Take 3 capsules by mouth every morning 4)  Hydrochlorothiazide 25 Mg Tabs (Hydrochlorothiazide) .... Take 1 tab daily 5)  Effexor 75 Mg Tabs (Venlafaxine hcl) .... Take 1 tab twice daily 6)  Toprol Xl 100 Mg Tb24 (Metoprolol succinate) .... Take one tab daily 7)  Norvasc 10 Mg Tabs (Amlodipine besylate) .... Take once daily 8)  Crestor 20 Mg Tabs (Rosuvastatin calcium) .... Take one tab daily. 9)  Naprosyn 500 Mg Tabs (Naproxen) .... One tablet two times a day as needed for severe back pain 10)  Protonix 40 Mg Pack (Pantoprazole sodium) .Marland Kitchen.. 1 by mouth once daily for heartburn 11)  Clotrimazole 1 % Crea (Clotrimazole) .... Apply to affected areas two times a day for 1 week 12)  Hydrocortisone 2.5 % Oint (Hydrocortisone) .... Apply to affected areas once daily for 1 week 13)  Keflex 500 Mg Caps (Cephalexin) .... One tab by mouth two times a day x 7 days  Patient  Instructions: 1)  Please keep the area that was drained clean and dry.  Do not remove packing, come back tomorrow and we will repack the wound. 2)  Continue to take the Keflex and Take all of it 3)  If you have fever, chills, or feel that the area is getting worse call back to be seen sooner

## 2010-08-05 NOTE — Letter (Signed)
Summary: Generic Letter  [facility]  [address]   [place], [place] [address]   Phone: [phone]  Fax: [phone]    04/20/2010  ALEEYA VEITCH [address] [place], [place]  [address]  Dear Ms. Andrey Campanile,   I have reviewed your xray's and labs from your recent office visit.  Everything looks good.  Your xray's show some arthritis, but nothing new.  Please call my office if you have any questions.         Sincerely,   Alvia Grove DO  Appended Document: Generic Letter mailed

## 2010-08-05 NOTE — Progress Notes (Signed)
Summary: triage   Phone Note Call from Patient Call back at [phone]   Caller: Patient Summary of Call: Pt found lump under right side of breast.  Can she be seen today? Initial call taken by: Clydell Hakim,  January 25, 2010 9:29 AM  Follow-up for Phone Call        states she had a lump before & it turned out to be nothing. wants this checked. appt now with work in. Follow-up by: Golden Circle RN,  January 25, 2010 9:44 AM    agree

## 2010-08-05 NOTE — Consult Note (Signed)
Summary: [facility] Phys Therapy  [facility] Phys Therapy   Imported By: De Nurse 04/29/2010 14:11:55  _____________________________________________________________________  External Attachment:    Type:   Image     Comment:   External Document

## 2010-08-05 NOTE — Progress Notes (Signed)
Summary: phn msg   Phone Note Call from Patient Call back at [phone]   Caller: Patient Summary of Call: wants to know if she is contagious Initial call taken by: De Nurse,  December 28, 2009 3:07 PM  Follow-up for Phone Call        Called patient back and advised she is not contagious.  Advised to return to clinic if gets worse on current treatment.  Patient verbalizes understanding. Follow-up by: Romero Belling MD,  December 28, 2009 4:05 PM

## 2010-08-05 NOTE — Consult Note (Signed)
Summary: [facility] Phys Therapy  [facility] Phys Therapy   Imported By: De Nurse 04/21/2010 12:02:34  _____________________________________________________________________  External Attachment:    Type:   Image     Comment:   External Document

## 2010-08-05 NOTE — Letter (Signed)
Summary: Lipid Letter  [facility]  [address]   [place], [place] [address]   Phone: [phone]  Fax: [phone]    09/07/2009  Kelsi Benham [address] [place], [place]  [address]  Dear Gavin Pound:  We have carefully reviewed your last lipid profile from 07/08/2009 and the results are noted below with a summary of recommendations for lipid management.    Cholesterol:       181     Goal: < 200   HDL "good" Cholesterol:   58     Goal: > 40   LDL "bad" Cholesterol:   91     Goal: < 100   Triglycerides:       162     Goal: < 150    Overall these numbers are good.  Continue to take your medication, watch your diet and exercise regularly and these numbers will continue to improve.    TLC Diet (Therapeutic Lifestyle Change): Saturated Fats & Transfatty acids should be kept < 7% of total calories ***Reduce Saturated Fats Polyunstaurated Fat can be up to 10% of total calories Monounsaturated Fat Fat can be up to 20% of total calories Total Fat should be no greater than 25-35% of total calories Carbohydrates should be 50-60% of total calories Protein should be approximately 15% of total calories Fiber should be at least 20-30 grams a day ***Increased fiber may help lower LDL Total Cholesterol should be < 200mg /day Consider adding plant stanol/sterols to diet (example: Benacol spread) ***A higher intake of unsaturated fat may reduce Triglycerides and Increase HDL    Adjunctive Measures (may lower LIPIDS and reduce risk of Heart Attack) include: Aerobic Exercise (20-30 minutes 3-4 times a week) Limit Alcohol Consumption Weight Reduction Aspirin 75-81 mg a day by mouth (if not allergic or contraindicated) Dietary Fiber 20-30 grams a day by mouth     Current Medications: 1)    Tegretol Xr 200 Mg  Tb12 (Carbamazepine) .... Take one tab every morning and two tabs every night. 2)    Flexeril 10 Mg Tabs (Cyclobenzaprine hcl) .Marland Kitchen.. 1 by mouth 3x daily as needed for  back pain 3)    Dilantin 100 Mg  Caps (Phenytoin sodium extended) .... Take 3 capsules by mouth every morning 4)    Hydrochlorothiazide 25 Mg  Tabs (Hydrochlorothiazide) .... Take 1 tab daily 5)    Effexor 75 Mg  Tabs (Venlafaxine hcl) .... Take 1 tab twice daily 6)    Toprol Xl 100 Mg  Tb24 (Metoprolol succinate) .... Take one tab daily 7)    Norvasc 10 Mg  Tabs (Amlodipine besylate) .... Take once daily 8)    Trazodone Hcl 50 Mg  Tabs (Trazodone hcl) .... Take half of a tab nightly 9)    Crestor 20 Mg  Tabs (Rosuvastatin calcium) .... Take one tab daily. 10)    Naprosyn 500 Mg Tabs (Naproxen) .... One tablet two times a day as needed for severe back pain 11)    Protonix 40 Mg Pack (Pantoprazole sodium) .Marland Kitchen.. 1 by mouth once daily for heartburn 12)    Gabapentin 300 Mg Caps (Gabapentin) .... Titrate up as directed to goal 2 tabs by mouth three times a day for nerve pain  If you have any questions, please call. We appreciate being able to work with you.   Sincerely,    [facility] Ancil Boozer  MD  Appended Document: Lipid Letter mailed.

## 2010-08-05 NOTE — Progress Notes (Signed)
Summary: refill  Medications Added TOPROL XL 100 MG  TB24 (METOPROLOL SUCCINATE) take one tab daily       Phone Note Refill Request Call back at Home Phone [phone] Message from:  Patient  Refills Requested: Medication #1:  HYDROCHLOROTHIAZIDE 25 MG  TABS take 1 tab daily  Medication #2:  TOPROL XL 100 MG  TB24 take one tab daily [facility] hasn't been taking needs asap  Next Appointment Scheduled: 10/13 Initial call taken by: De Nurse,  April 01, 2010 9:04 AM    New/Updated Medications: TOPROL XL 100 MG  TB24 (METOPROLOL SUCCINATE) take one tab daily Prescriptions: TOPROL XL 100 MG  TB24 (METOPROLOL SUCCINATE) take one tab daily  #30 x 2   Entered and Authorized by:   Alvia Grove DO   Signed by:   Alvia Grove DO on 04/02/2010   Method used:   Faxed to ...       [facility] Department (retail)       [address] [place], [place]  [address]       Ph: [phone]       Fax: [phone]   RxID:   [id number] HYDROCHLOROTHIAZIDE 25 MG  TABS (HYDROCHLOROTHIAZIDE) take 1 tab daily  #100 x 3   Entered and Authorized by:   Alvia Grove DO   Signed by:   Alvia Grove DO on 04/02/2010   Method used:   Faxed to ...       [facility] Department (retail)       [address] [place], [place]  [address]       Ph: [phone]       Fax: [phone]   RxID:   [id number]

## 2010-08-05 NOTE — Assessment & Plan Note (Addendum)
Summary: f/u,df   Vital Signs:  Patient profile:   55 year old female Height:      62 inches Weight:      225.38 pounds BMI:     41.37 BSA:     2.01 Temp:     98.7 degrees F Pulse rate:   82 / minute BP sitting:   126 / 85  Vitals Entered By: Garen Grams LPN (April 15, 2010 1:40 PM) CC: f/u Is Patient Diabetic? No Pain Assessment Patient in pain? yes     Location: back Intensity: 7   Primary Care Provider:  Alvia Grove DO  CC:  f/u.  History of Present Illness: 55 yo female here for f/u of multiple medical problems (1st visit with me) 1. Back pain: similar to previous back pain.  Pain starts in her lower back and goes down to her calves bialterally. Tried gabapentin, diflucan, and Ibuprofen without relief.  Pain is worse in left than in right.  she denies tingling or numbness of legs or saddle area.  denies urine or stool incontinence.    2. Hx of seizures: stopped taking her dilantin.  Continues taking her tegretol.  Denies any recent seizure activity.   3. HTN: taking meds daily.  No chest pain, no SOB, no DOE  Habits & Providers  Alcohol-Tobacco-Diet     Tobacco Status: current     Tobacco Counseling: to quit use of tobacco products     Cigarette Packs/Day: 0.5  Current Problems (verified): 1)  Sacroiliitis, Left  (ICD-720.2) 2)  Breast Mass, Right  (ICD-611.72) 3)  Abscess, Skin  (ICD-682.9) 4)  Boils, Recurrent  (ICD-680.9) 5)  Intertrigo  (ICD-695.89) 6)  Dental Caries  (ICD-521.00) 7)  Unspecified Renal Sclerosis  (ICD-587) 8)  Achilles Tendinitis, Bilateral  (ICD-726.71) 9)  Plantar Fasciitis, Bilateral  (ICD-728.71) 10)  Back Pain, Low  (ICD-724.2) 11)  Herniated Lumbar Disc  (ICD-722.10) 12)  Carpal Tunnel Syndrome  (ICD-354.0) 13)  Hx of Mrsa Infection  (ICD-041.19) 14)  Dermatitis, Allergic  (ICD-692.9) 15)  Constipation  (ICD-564.00) 16)  Gerd, Severe  (ICD-530.81) 17)  Hx of Gastric Ulcer Acute Without Hemorrhage  (ICD-533.30) 18)   Obesity Nos  (ICD-278.00) 19)  Tobacco Dependence  (ICD-305.1) 20)  Hypertension, Benign Systemic  (ICD-401.1) 21)  Hyperlipidemia  (ICD-272.4) 22)  Somatization Disorder  (ICD-300.81) 23)  Depression, Major, Recurrent  (ICD-296.30) 24)  Convulsions, Seizures, Nos  (ICD-780.39)  Current Medications (verified): 1)  Tegretol Xr 200 Mg  Tb12 (Carbamazepine) .... Take One Tab Every Morning and Two Tabs Every Night. 2)  Flexeril 10 Mg Tabs (Cyclobenzaprine Hcl) .Marland Kitchen.. 1 By Mouth 3x Daily As Needed For Back Pain 3)  Hydrochlorothiazide 25 Mg  Tabs (Hydrochlorothiazide) .... Take 1 Tab Daily 4)  Effexor 75 Mg  Tabs (Venlafaxine Hcl) .... Take 1 Tab Twice Daily 5)  Toprol Xl 100 Mg  Tb24 (Metoprolol Succinate) .... Take One Tab Daily 6)  Norvasc 10 Mg  Tabs (Amlodipine Besylate) .... Take Once Daily 7)  Crestor 20 Mg  Tabs (Rosuvastatin Calcium) .... Take One Tab Daily. 8)  Protonix 40 Mg Pack (Pantoprazole Sodium) .Marland Kitchen.. 1 By Mouth Once Daily For Heartburn 9)  Clotrimazole 1 % Crea (Clotrimazole) .... Apply To Affected Areas Two Times A Day For 1 Week 10)  Hydrocortisone 2.5 % Oint (Hydrocortisone) .... Apply To Affected Areas Once Daily For 1 Week 11)  Bactroban 2 % Oint (Mupirocin) .... Apply Small Amount To Each Nostril 3 Times Per Day For  5 Days  Allergies (verified): 1)  ! Diflucan (Fluconazole)  Past History:  Past Medical History: Last updated: 12/16/2009 G4P3 GI :chronic constipation,  tx with Miralax by [facility] for belching/ hiatal, UGI sm bwl follow through- 9/01 - GERD only hospitalized 11/29 resistant otitis /?mastoiditis hospitalized 12/03 costochondritis - adenosine CL 11/07/02-no ischemia, EF 41% chronic back pain - recent back and hip xrays show signs of chronic greater trochanteric bursa inflammation L>R, facet degenerative changes, bilateral mild hip degenerative changes, and also ? 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 on scans for back ACHILLES TENDINITIS, BILATERAL (ICD-726.71) CARPAL TUNNEL SYNDROME (ICD-354.0) PLANTAR FASCIITIS, BILATERAL (ICD-728.71) Hx of MRSA INFECTION (ICD-041.19) DERMATITIS, ALLERGIC (ICD-692.9) OBESITY NOS (ICD-278.00) TOBACCO DEPENDENCE (ICD-305.1) HYPERTENSION, BENIGN SYSTEMIC (ICD-401.1) HYPERLIPIDEMIA (ICD-272.4) SOMATIZATION DISORDER (ICD-300.81) DEPRESSION, MAJOR, RECURRENT (ICD-296.30) CONVULSIONS, SEIZURES, NOS (ICD-780.39) - doesn't see neurologist for financial reasons  Past Surgical History: Last updated: 01/18/2007 BTL - 1991  complete hysterectomy for fibroids - 03/20/2002 kidney stenting - 03/20/2002 laproscopic cholecystectomy 1996  h/o peritonsillar abscess s/p I&D 10/99  Family History: Last updated: 05/25/2007 CHF - grandfather, DM - aunt Grandmother with Alzheimers  Social History: Last updated: 12/16/2009 poor family dynamics contributing to depressed  emotional state; medicaid;daughter recently moved out. [facility] [place]  new grandson adrienne malik [place] smokes 1/2 ppd. financial strain  Risk Factors: Caffeine Use: 1 (12/07/2007) Exercise: no (12/07/2007)  Risk Factors: Smoking Status: current (04/15/2010) Packs/Day: 0.5 (04/15/2010)  Social History: Reviewed history from 12/16/2009 and no changes required. poor family dynamics contributing to depressed  emotional state; medicaid;daughter recently moved out. [facility] [place]).  new grandson adrienne malik [place] smokes 1/2 ppd. financial strain  Review of Systems       see HPI for full ROS  Physical Exam  General:  VS reviewed, alert, well-developed, well-nourished, and well-hydrated.   Lungs:  normal respiratory effort.  normal breath sounds, no crackles, and no wheezes.   Heart:  normal rate.  regular rhythm and no murmur.   Abdomen:  soft.  non-tender and  normal bowel sounds.   Msk:   TTP bilaterally in low lumbar paraspinal area.  TTP bilateral SI joints.   Negative SLR bilaterally.   Gait normal.    ROM lumbar spine: 90 degrees flexion, 15 degrees extension, 15 degrees lateral bending left and right but all motions painful.  Similar pain but FROM with hip flexion, internal and external rotation bilaterally. Neurologic:  No cranial nerve deficits noted. Station and gait are normal. Plantar reflexes are down-going bilaterally. DTRs are symmetrical throughout. Sensory, motor and coordinative functions appear intact.   Impression & Recommendations:  Problem # 1:  BACK PAIN, LOW (ICD-724.2) Repeat xray to evaluate for change. ? OA Refilled flexeril  The following medications were removed from the medication list:    Diclofenac Sodium 75 Mg Tbec (Diclofenac sodium) .Marland Kitchen... 1 by mouth two times a day Her updated medication list for this problem includes:    Flexeril 10 Mg Tabs (Cyclobenzaprine hcl) .Marland Kitchen... 1 by mouth 3x daily as needed for back pain  Orders: Diagnostic X-Ray/Fluoroscopy (Diagnostic X-Ray/Flu) [facility]- Est  Level 4 (91478)  Problem # 2:  CONVULSIONS, SEIZURES, NOS (ICD-780.39) check tegretol level today The following medications were removed from the medication list:    Dilantin 100 Mg Caps (Phenytoin sodium extended) .Marland Kitchen... Take 3 capsules by mouth every morning Her updated medication list for this problem  includes:    Tegretol Xr 200 Mg Tb12 (Carbamazepine) .Marland Kitchen... Take one tab every morning and two tabs every night.  Orders: Tegretol-[facility] ([id number]) [facility]- Est  Level 4 (86578)  Problem # 3:  HYPERTENSION, BENIGN SYSTEMIC (ICD-401.1) stable.  continue current meds. Her updated medication list for this problem includes:    Hydrochlorothiazide 25 Mg Tabs (Hydrochlorothiazide) .Marland Kitchen... Take 1 tab daily    Toprol Xl 100 Mg Tb24 (Metoprolol succinate) .Marland Kitchen... Take one tab daily    Norvasc 10 Mg Tabs (Amlodipine besylate) .Marland Kitchen... Take  once daily  Complete Medication List: 1)  Tegretol Xr 200 Mg Tb12 (Carbamazepine) .... Take one tab every morning and two tabs every night. 2)  Flexeril 10 Mg Tabs (Cyclobenzaprine hcl) .Marland Kitchen.. 1 by mouth 3x daily as needed for back pain 3)  Hydrochlorothiazide 25 Mg Tabs (Hydrochlorothiazide) .... Take 1 tab daily 4)  Effexor 75 Mg Tabs (Venlafaxine hcl) .... Take 1 tab twice daily 5)  Toprol Xl 100 Mg Tb24 (Metoprolol succinate) .... Take one tab daily 6)  Norvasc 10 Mg Tabs (Amlodipine besylate) .... Take once daily 7)  Crestor 20 Mg Tabs (Rosuvastatin calcium) .... Take one tab daily. 8)  Protonix 40 Mg Pack (Pantoprazole sodium) .Marland Kitchen.. 1 by mouth once daily for heartburn 9)  Clotrimazole 1 % Crea (Clotrimazole) .... Apply to affected areas two times a day for 1 week 10)  Hydrocortisone 2.5 % Oint (Hydrocortisone) .... Apply to affected areas once daily for 1 week 11)  Bactroban 2 % Oint (Mupirocin) .... Apply small amount to each nostril 3 times per day for 5 days  Other Orders: Comp Met-[facility] ([id number])   Prevention & Chronic Care Immunizations   Influenza vaccine: Fluvax Non-MCR  (04/08/2008)   Influenza vaccine due: 04/08/2009    Tetanus booster: 06/13/2008: Tdap   Tetanus booster due: 06/13/2018    Pneumococcal vaccine: Not documented  Colorectal Screening   Hemoccult: Not documented   Hemoccult due: Not Indicated    Colonoscopy: Location:  [facility].    (04/18/2006)   Colonoscopy due: 04/18/2016  Other Screening   Pap smear: Done.  (03/04/2002)   Pap smear due: 03/04/2005    Mammogram: BI-RADS CATEGORY 2:  Benign finding(s).^MM DIGITAL DIAGNOSTIC BILAT  (02/09/2010)   Mammogram action/deferral: Ordered  (01/25/2010)   Mammogram due: 02/10/2011   Smoking status: current  (04/15/2010)   Smoking cessation counseling: yes  (06/13/2008)  Lipids   Total Cholesterol: 181  (07/08/2009)   LDL: 91  (07/08/2009)   LDL Direct: Not documented   HDL: 58   (07/08/2009)   Triglycerides: 162  (07/08/2009)    SGOT (AST): 19  (07/08/2009)   SGPT (ALT): 21  (07/08/2009) CMP ordered    Alkaline phosphatase: 134  (07/08/2009)   Total bilirubin: 0.2  (07/08/2009)    Lipid flowsheet reviewed?: Yes   Progress toward LDL goal: At goal  Hypertension   Last Blood Pressure: 126 / 85  (04/15/2010)   Serum creatinine: 0.90  (07/08/2009)   Serum potassium 4.8  (07/08/2009) CMP ordered     Hypertension flowsheet reviewed?: Yes   Progress toward BP goal: At goal  Self-Management Support :   Personal Goals (by the next clinic visit) :      Personal blood pressure goal: 140/90  (07/08/2009)     Personal LDL goal: 130  (07/08/2009)    Hypertension self-management support: Not documented    Hypertension self-management support not done because: Good outcomes  (07/08/2009)    Lipid self-management support:  Not documented     Lipid self-management support not done because: Good outcomes  (07/08/2009)  Patient Instructions: 1)  Great to see you today! 2)  I will call you if your blood work is abnormal 3)  I have refilled your meds today 4)  Please get your xrays today 5)  Please schedule a follow-up appointment in 2 - 3 weeks.  Prescriptions: HYDROCHLOROTHIAZIDE 25 MG  TABS (HYDROCHLOROTHIAZIDE) take 1 tab daily  #100 x 3   Entered and Authorized by:   Alvia Grove DO   Signed by:   Alvia Grove DO on 04/15/2010   Method used:   Handwritten   RxID:   [id number] FLEXERIL 10 MG TABS (CYCLOBENZAPRINE HCL) 1 by mouth 3x daily as needed for back pain  #60 x 3   Entered and Authorized by:   Alvia Grove DO   Signed by:   Alvia Grove DO on 04/15/2010   Method used:   Handwritten   RxID:   [id number]

## 2010-08-05 NOTE — Progress Notes (Signed)
Summary: phn msg   Phone Note Call from Patient Call back at [facility] Phone [phone]   Caller: Patient Summary of Call: [facility] digital [facility] due to her having a little lumb under her right breast. Initial call taken by: Clydell Hakim,  February 05, 2010 11:30 AM    Order placed, will call pt when completed.    Alvia Grove

## 2010-08-11 ENCOUNTER — Other Ambulatory Visit: Payer: Self-pay | Admitting: Family Medicine

## 2010-08-11 MED ORDER — AMLODIPINE BESYLATE 10 MG PO TABS
10.0000 mg | ORAL_TABLET | Freq: Every day | ORAL | Status: DC
Start: 2010-08-11 — End: 2011-01-24

## 2010-08-16 ENCOUNTER — Other Ambulatory Visit: Payer: Self-pay | Admitting: Family Medicine

## 2010-08-16 MED ORDER — PHENYTOIN SODIUM EXTENDED 100 MG PO CAPS: 300.0000 mg | ORAL_CAPSULE | Freq: Every day | ORAL | Status: DC

## 2010-09-20 LAB — COMPREHENSIVE METABOLIC PANEL
ALT: 25 U/L (ref 0–35)
AST: 27 U/L (ref 0–37)
Albumin: 4.6 g/dL (ref 3.5–5.2)
Alkaline Phosphatase: 150 U/L — ABNORMAL HIGH (ref 39–117)
BUN: 14 mg/dL (ref 6–23)
CO2: 31 mEq/L (ref 19–32)
Calcium: 9.9 mg/dL (ref 8.4–10.5)
Chloride: 101 mEq/L (ref 96–112)
Creatinine, Ser: 0.85 mg/dL (ref 0.4–1.2)
GFR calc Af Amer: >60 mL/min (ref >60)
GFR calc non Af Amer: >60 mL/min (ref >60)
Glucose, Bld: 98 mg/dL (ref 70–99)
Potassium: 3.7 mEq/L (ref 3.5–5.1)
Sodium: 140 mEq/L (ref 135–145)
Total Bilirubin: 0.3 mg/dL (ref 0.3–1.2)
Total Protein: 8 g/dL (ref 6.0–8.3)

## 2010-09-20 LAB — URINALYSIS, ROUTINE W REFLEX MICROSCOPIC
Bilirubin Urine: NEGATIVE
Ketones, ur: NEGATIVE mg/dL
Specific Gravity, Urine: 1.023 (ref 1.005–1.030)
Urobilinogen, UA: 0.2 mg/dL (ref 0.0–1.0)

## 2010-09-20 LAB — CBC
HCT: 41.4 % (ref 36.0–46.0)
Hemoglobin: 14 g/dL (ref 12.0–15.0)
MCHC: 33.8 g/dL (ref 30.0–36.0)
MCV: 95.6 fL (ref 78.0–100.0)
Platelets: 207 10*3/uL (ref 150–400)
RBC: 4.33 MIL/uL (ref 3.87–5.11)
RDW: 12.7 % (ref 11.5–15.5)
WBC: 5.2 10*3/uL (ref 4.0–10.5)

## 2010-09-20 LAB — URINE MICROSCOPIC-ADD ON

## 2010-09-20 LAB — DIFFERENTIAL
Basophils Absolute: 0.1 10*3/uL (ref 0.0–0.1)
Basophils Relative: 1 % (ref 0–1)
Eosinophils Absolute: 0.2 10*3/uL (ref 0.0–0.7)
Eosinophils Relative: 4 % (ref 0–5)
Lymphocytes Relative: 54 % — ABNORMAL HIGH (ref 12–46)
Lymphs Abs: 2.8 10*3/uL (ref 0.7–4.0)
Monocytes Absolute: 0.4 10*3/uL (ref 0.1–1.0)
Monocytes Relative: 9 % (ref 3–12)
Neutro Abs: 1.7 10*3/uL (ref 1.7–7.7)
Neutrophils Relative %: 33 % — ABNORMAL LOW (ref 43–77)

## 2010-09-20 LAB — LIPASE, BLOOD: Lipase: 36 U/L (ref 11–59)

## 2010-09-20 LAB — D-DIMER, QUANTITATIVE: D-Dimer, Quant: 0.26 ug/mL-FEU (ref 0.00–0.48)

## 2010-09-20 LAB — GLUCOSE, CAPILLARY: Glucose-Capillary: 86 mg/dL (ref 70–99)

## 2010-09-30 ENCOUNTER — Ambulatory Visit (INDEPENDENT_AMBULATORY_CARE_PROVIDER_SITE_OTHER): Payer: Self-pay | Admitting: Family Medicine

## 2010-09-30 ENCOUNTER — Encounter: Payer: Self-pay | Admitting: Family Medicine

## 2010-09-30 DIAGNOSIS — R569 Unspecified convulsions: Secondary | ICD-10-CM

## 2010-09-30 DIAGNOSIS — M6740 Ganglion, unspecified site: Secondary | ICD-10-CM

## 2010-09-30 DIAGNOSIS — I10 Essential (primary) hypertension: Secondary | ICD-10-CM

## 2010-09-30 DIAGNOSIS — M67439 Ganglion, unspecified wrist: Secondary | ICD-10-CM

## 2010-09-30 DIAGNOSIS — E785 Hyperlipidemia, unspecified: Secondary | ICD-10-CM

## 2010-09-30 LAB — COMPREHENSIVE METABOLIC PANEL
ALT: 12 U/L (ref 0–35)
BUN: 9 mg/dL (ref 6–23)
CO2: 27 mEq/L (ref 19–32)
Calcium: 9.9 mg/dL (ref 8.4–10.5)
Chloride: 103 mEq/L (ref 96–112)
Creat: 0.93 mg/dL (ref 0.40–1.20)
Glucose, Bld: 91 mg/dL (ref 70–99)
Total Bilirubin: 0.3 mg/dL (ref 0.3–1.2)

## 2010-09-30 LAB — LIPID PANEL
Cholesterol: 179 mg/dL (ref 0–200)
Total CHOL/HDL Ratio: 2.7 Ratio
Triglycerides: 90 mg/dL (ref <150)
VLDL: 18 mg/dL (ref 0–40)

## 2010-09-30 MED ORDER — HYDROCHLOROTHIAZIDE 25 MG PO TABS: 25.0000 mg | ORAL_TABLET | Freq: Every day | ORAL | Status: DC

## 2010-09-30 NOTE — Patient Instructions (Addendum)
It was great to see you today!  I will refill your HCTZ We will check the lab work for you today Make sure you get exercise as we discussed. Follow up in 6 months   - Dr. Wallene Huh       High-Fiber Diet A high-fiber diet changes your normal diet to include more whole grains, legumes, fruits, and vegetables. Changes in the diet involve replacing refined carbohydrates with unrefined foods. The calorie level of the diet is essentially unchanged. The Dietary Reference Intake (recommended amount) for adult males is 38 grams per day. For adult females, it is 25 grams per day. Pregnant and lactating women should consume 28 grams of fiber per day. Fiber is the intact part of a plant that is not broken down during digestion. Functional fiber is fiber that has been isolated from the plant to provide a beneficial effect in the body. PURPOSE  Increase stool bulk.   Ease and regulate bowel movements.   Lower cholesterol.  INDICATIONS THAT YOU NEED MORE FIBER  Constipation and hemorrhoids.   Uncomplicated diverticulosis (intestine condition) and irritable bowel syndrome.   Weight management.   As a protective measure against hardening of the arteries (atherosclerosis), diabetes, and cancer.  NOTE OF CAUTION If you have a digestive or bowel problem, ask your caregiver for advice before adding high-fiber foods to your diet. Some of the following medical problems are such that a high-fiber diet should not be used without consulting your caregiver. DO NOT USE WITH:  Acute diverticulitis (intestine infection).   Partial small bowel obstructions.   Complicated diverticular disease involving bleeding, rupture (perforation), or abscess (boil, furuncle).   Presence of autonomic neuropathy (nerve damage) or gastric paresis (stomach cannot empty itself).  GUIDELINES FOR INCREASING FIBER IN THE DIET  Start adding fiber to the diet slowly. A gradual increase of about 5 more grams (2 slices of  whole-wheat bread, 2 servings of most fruits or vegetables, or 1 bowl of high-fiber cereal) per day is best. Too rapid an increase in fiber may result in constipation, flatulence, and bloating.   Drink enough water and fluids to keep your urine clear or pale yellow. Water, juice, or caffeine-free drinks are recommended. Not drinking enough fluid may cause constipation.   Eat a variety of high-fiber foods rather than one type of fiber.   Try to increase your intake of fiber through using high-fiber foods rather than fiber pills or supplements that contain small amounts of fiber.   The goal is to change the types of food eaten. Do not supplement your present diet with high-fiber foods, but replace foods in your present diet.  INCLUDE A VARIETY OF FIBER SOURCES  Replace refined and processed grains with whole grains, canned fruits with fresh fruits, and incorporate other fiber sources. White rice, white breads, and most bakery goods contain little or no fiber.   Brown whole-grain rice, buckwheat oats, and many fruits and vegetables are all good sources of fiber. These include: broccoli, Brussels sprouts, cabbage, cauliflower, beets, sweet potatoes, white potatoes (skin on), carrots, tomatoes, eggplant, squash, berries, fresh fruits, and dried fruits.   Cereals appear to be the richest source of fiber. Cereal fiber is found in whole grains and bran. Bran is the fiber-rich outer coat of cereal grain, which is largely removed in refining. In whole-grain cereals, the bran remains. In breakfast cereals, the largest amount of fiber is found in those with "bran" in their names. The fiber content is sometimes indicated on the label.  You may need to include additional fruits and vegetables each day.   In baking, for 1 cup white flour, you may use the following substitutions:   1 cup whole-wheat flour minus 2 tablespoons.   1/2 cup white flour plus 1/2 cup whole-wheat flour.  References: Dietary  Reference Intakes: Recommended Intakes for Individuals. [employer]. Institute of Medicine. Food and Nutrition Board. Document Released: 06/20/2005 Document Re-Released: 09/14/2009 [employer] Patient Information 2011 [employer].2 Gram Low Sodium Diet A 2 gram sodium diet restricts the amount of sodium in the diet to no more than 2 grams (g) or 2000 milligrams (mg) daily. Limiting the amount of sodium is often used to help lower blood pressure. It is important if you have heart, liver, or kidney problems. Many foods contain sodium for flavor and sometimes as a preservative. When the amount of sodium in a diet needs to be low, it is important to know what to look for when choosing foods and drinks. The following includes some information and guidelines to help make it easier for you to adapt to a low sodium diet. QUICK TIPS  Do not add salt to food.   Avoid convenience items and fast food.   Choose unsalted snack foods.   Buy lower sodium products, often labeled as "lower sodium" or "no salt added."   Check food labels to learn how much sodium is in 1 serving.   When eating at a restaurant, ask that your food be prepared with less salt or none, if possible.  READING FOOD LABELS FOR SODIUM INFORMATION The nutrition facts label is a good place to find how much sodium is in foods. Look for products with no more than 500 to 600 mg of sodium per meal and no more than 150 mg per serving. Remember that 2 g = 2000 mg. The food label may also list foods as:  Sodium-free: Less than 5 mg in a serving.   Very low sodium: 35 mg or less in a serving.   Low-sodium: 140 mg or less in a serving.   Light in sodium: 50% less sodium in a serving. For example, if a food that usually has 300 mg of sodium is changed to become light in sodium, it will have 150 mg of sodium.   Reduced sodium: 25% less sodium in a serving. For example, if a food that usually has 400 mg of sodium is changed to  reduced sodium, it will have 300 mg of sodium.  CHOOSING FOODS AVOID CHOOSE  Grains: Salted crackers and snack items. Some cereals, including instant hot cereals. Bread stuffing and biscuit mixes. Seasoned rice or pasta mixes. Grains: Unsalted snack items. Low-sodium cereals, oats, puffed wheat and rice, shredded wheat. English muffins and bread. Pasta.  Meats: Salted, canned, smoked, spiced, or pickled meats, including fish and poultry. Bacon, ham, sausage, cold cuts, hot dogs, anchovies. Meats: Low-sodium canned tuna and salmon. Fresh or frozen meat, poultry, and fish.  Dairy: Processed cheese and spreads. Cottage cheese. Buttermilk and condensed milk. Regular cheese. Dairy: Milk. Low-sodium cottage cheese. Yogurt. Sour cream. Low-sodium cheese.  Fruits and Vegetables: Regular canned vegetables. Regular canned tomato sauce and paste. Frozen vegetables in sauces. Olives. Rosita Fire. Relishes. Sauerkraut. Fruits and Vegetables: Low-sodium canned vegetables. Low-sodium tomato sauce and paste. Fresh and frozen vegetables. Fresh and frozen fruit.  Condiments: Canned and packaged gravies. Worcestershire sauce. Tartar sauce. Barbecue sauce. Soy sauce. Steak sauce. Ketchup. Onion, garlic, and table salt. Meat flavorings and tenderizers. Condiments: Fresh and dried  herbs and spices. Low-sodium varieties of mustard and ketchup. Lemon juice. Tabasco sauce. Horseradish.  SAMPLE 2 GRAM SODIUM MEAL PLAN Meal Foods Sodium (mg)  Breakfast 1 cup low-fat milk 2 slices whole-wheat toast 1 tablespoon heart-healthy margarine 1 hard-boiled egg 1 small orange [phone]  Lunch 1 cup raw carrots  cup hummus 1 cup low-fat milk  cup red grapes 1 whole-wheat pita bread 76 [phone]  Dinner 1 cup whole-wheat pasta 1 cup low-sodium tomato sauce 3 ounces lean ground beef 1 small side salad (1 cup raw spinach leaves,  cup cucumber,  cup yellow bell pepper)  with 1 teaspoon olive oil and 1 teaspoon red wine vinegar 2 73 57 25  Snack 1 container low-fat vanilla yogurt 3 graham cracker squares 107 127  Nutrient Analysis Calories: 2033 Protein: 77 g Carbohydrate: 282 g Fat: 72 g Sodium: 1971 mg Document Released: 06/20/2005 Document Re-Released: 12/08/2009 [facility] Patient Information 2011 [place], [place].        Ganglion A ganglion is a swelling under the skin that is filled with a thick, jelly-like substance. It is a synovial cyst. This is caused by a break (rupture) of the joint lining from the joint space. A ganglion often occurs near an area of repeated minor trauma (damage caused by an accident). Trauma may also be a repetitive movement at work or in a sport. TREATMENT It often goes away without treatment. It may reappear later. Sometimes a ganglion may need to be surgically removed. Often they are drained and injected with a steroid. Sometimes they respond to:  Rest.   Occasionally splinting helps.  HOME CARE INSTRUCTIONS  Your caregiver will decide the best way of treating your ganglion. Do not try to break the ganglion yourself by pressing on it, poking it with a needle, or hitting it with a heavy object.   Use medications as directed.  1.  2. SEEK MEDICAL CARE IF:   The ganglion becomes larger or more painful.   You have increased redness or swelling.   You have weakness or numbness in your hand or wrist.  MAKE SURE YOU:   Understand these instructions.   Will watch your condition.   Will get help right away if you are not doing well or get worse.  Document Released: 06/17/2000 Document Re-Released: 09/16/2008 [facility] Patient Information 2011 [place], [place].

## 2010-10-01 ENCOUNTER — Encounter: Payer: Self-pay | Admitting: Family Medicine

## 2010-10-01 DIAGNOSIS — M67439 Ganglion, unspecified wrist: Secondary | ICD-10-CM | POA: Insufficient documentation

## 2010-10-01 LAB — CBC
MCH: 29.9 pg (ref 26.0–34.0)
MCHC: 32 g/dL (ref 30.0–36.0)
MCV: 93.4 fL (ref 78.0–100.0)
Platelets: 254 10*3/uL (ref 150–400)
RBC: 4.25 MIL/uL (ref 3.87–5.11)

## 2010-10-01 NOTE — Assessment & Plan Note (Signed)
Goal LDL < 100 goal HDL > 40. Check lipids today. Discussed diet and exercise for 15 minutes.

## 2010-10-01 NOTE — Progress Notes (Signed)
  Subjective:    Patient ID: Kristin [name], female    DOB: Apr 07, 1956, 55 y.o.   MRN: [id number]  HPI 1) Seizure disorder: Taking Dilantin and Carbamazepine as prescribed without side effects. Has not had monitoring labs checked in about one year. Last seizure was 3 years ago.   2) Hypertension: Taking medications as prescribed without side effects. Denies chest pain, LE edema, headache, dyspnea.   3) Hyperlipidemia: Last LDL 91, last HDL 58 in January 2011. Taking Crestor without side effects.   4) "Knot" on left hand: x several months. Dorsum of left wrist. Somewhat tender to palpation, hurts with wrist flexion / extension at times. Had one on the right wrist which was removed several years ago.    - Reviewed past medical history, tobacco use with patient.    Review of Systems As per HPI; also positive for chronic back pain.     Objective:   Physical Exam General: Obese, pleasant, NAD Cardiovascular: Regular rate and rhythm, no murmurs Pulmonary: Clear to auscultation bilaterally, no crackles or wheezes  Neurological: Alert and oriented x 3   Cyst Removal: Onset of lesion: 2 months  Indication: Ganglion  Consent signed: yes  Procedure #1: drainage with 18 gauge needle, intralesional triamcinolone 40 injection    Size (in cm): 2.0 x 2.0    Region: dorsal    Location: left wrist     Anesthesia: topical   Cleaned and prepped with: alcohol Wound dressing: bandaid        Assessment & Plan:

## 2010-10-01 NOTE — Assessment & Plan Note (Signed)
" >>  ASSESSMENT AND PLAN FOR HYPERTENSION, BENIGN SYSTEMIC WRITTEN ON 10/01/2010 12:07 PM BY CAREW, [facility]  Near goal. Continue medications. Handouts on low-sodium, high fiber diet given. Exercise encouraged. Follow as as below.  "

## 2010-10-01 NOTE — Assessment & Plan Note (Addendum)
Stable. Continue medications as below. Check levels, monitoring parameters (CBC, CMET today). Based on current carbamazepine level, would consider recheck at next blood draw - based on the fact that patient is stable if level still low might consider monotherapy with Dilantin - would discuss with Neurology first.

## 2010-10-01 NOTE — Assessment & Plan Note (Signed)
Drained and injected with steroid as per procedure note. 2 cc viscous clear fluid removed. Handout given.

## 2010-10-01 NOTE — Assessment & Plan Note (Addendum)
Near goal. Continue medications. Handouts on low-sodium, high fiber diet given. Exercise encouraged. Follow as as below.

## 2010-10-22 ENCOUNTER — Encounter: Payer: Self-pay | Admitting: Family Medicine

## 2010-11-16 NOTE — Assessment & Plan Note (Signed)
[facility]                         GASTROENTEROLOGY OFFICE NOTE   [name], [name]                     MRN:          [id number]  DATE:12/04/2006                            DOB:          27-Oct-1955    REFERRING PHYSICIAN:  Tawnya [name]. Erenest Rasher, M.D.   Kristin [name] is referred back to see me for problems with GERD, belching,  constipation and abdominal bloating. She underwent colonoscopy and upper  endoscopy in October 2007. She was tried on MiraLax which lost its  effectiveness and now she is using Milk of Magnesia for constipation.  She takes several medications that may be exacerbating her constipation.  She has significant belching and reflux symptoms. She has not dysphagia,  odynophagia, melena, hematochezia or weight loss. She has used magnesium  citrate on occasion for more refractory episodes of constipation.   CURRENT MEDICATIONS:  Listed on the chart updated and reviewed.   MEDICATION ALLERGIES:  ROBITUSSIN.   PHYSICAL EXAMINATION:  GENERAL:  Overweight African-American female in  no acute distress.  VITAL SIGNS:  Weight 213.8 pounds, up 3.4 pounds since her visit in  October 2007. She is belching frequently and appears to be swallowing  air at times. Blood pressure is 120/80, pulse 72 and regular.  CHEST:  Clear to auscultation bilaterally.  CARDIAC:  Regular rate and rhythm without murmurs.  ABDOMEN:  Soft, nontender, nondistended with normal active bowel sounds.  No palpable organomegaly, masses or hernias.   ASSESSMENT/PLAN:  1. Chronic constipation. She takes several medications which may be      either causing her constipation or exacerbating it. She will return      to Dr. Erenest Rasher to reevaluate her medications. 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 Will begin a trial of Amitiza 8 mcg b.i.d. taken      with food. She is advised to discontinue the  medication if she has      diarrhea or significant nausea. She may use Milk of Magnesia p.r.n.      She is advised to maintain a high fiber diet with adequate fluid      intake.  2. Gastroesophageal reflux disease. Maintain standard antireflux      measures and continue Nexium 40 mg p.o. b.i.d. She will change the      timing of her doses to before breakfast and before dinner. She was      given written literature on all standard antireflux measures. Rule      out underlying gastroparesis. Schedule gastric emptying scan.  3. Aerophagia. Her belching is likely exacerbated by gastroesophageal      reflux disease;      however, she seems to have a component of aerophagia. This will      require ongoing attention to behavior modification. Further      followup with Dr. Erenest Rasher.     Kristin [name]. Kristin Dar, MD, [facility]  Electronically Signed    Kristin [name]  DD: 12/04/2006  DT: 12/04/2006  Job #: [id number]   cc:   Kristin [name]  Kristin [name], M.D.

## 2010-11-16 NOTE — Assessment & Plan Note (Signed)
[facility]                         GASTROENTEROLOGY OFFICE NOTE   Kristin [name], Kristin [name]                     MRN:          [id number]  DATE:06/27/2007                            DOB:          [birth year]    Kristin [name] returns to see me on referral from Dr. Ancil Boozer.  She  has had worsening problems with belching and episodic severe mid  thoracic back pain that radiates bilaterally under her arms and to her  chest.  The pain is not associated with meals, belching, or any  particular activity.  These symptoms have been present for several  weeks.  She carries a diagnosis of GERD and gastroparesis.  A gastric  emptying scan in June of this year showed 65% retention at 2 hours.  She  was tried on Reglan for two months along with a low fat and anti-reflux  diet, and she did appear to have a response to symptoms.  She has been  off promotility agents for several months.  She was recently found a  positive Helicobacter pylori antibody, and she is being treated with  Biaxin, amoxicillin, metronidazole, and Nexium.  She is currently on day  6 out of 10 of therapy.   CURRENT MEDICATIONS:  Listed on the chart, updated, and reviewed.   MEDICATION ALLERGIES:  ROBITUSSIN.   PHYSICAL EXAMINATION:  Overweight.  No acute distress.  Weight 223.2, which is up 10 pounds since June, 2008.  Blood pressure is  130/82.  Pulse 100 and regular.  CHEST:  Clear to auscultation bilaterally.  BACK:  Without CVA or spinal tenderness.  CARDIAC:  Regular rate and rhythm without murmurs.  ABDOMEN:  Soft and nondistended with normoactive bowel sounds.  No  palpable organomegaly, masses, or hernias.   ASSESSMENT/PLAN:  Frequent belching and episodic mid thoracic back pain.  She may have symptoms related to aerophagia, refractory reflux, and/or  gastroparesis or a combination.  I have advised her to return to Dr. Sandi Mealy  to evaluate for non-gastrointestinal causes of back and  chest pain.  She  needs to remain on a promotility agent, begin metoclopramide 10 mg p.o.  q.a.c. and nightly.  Complete therapy for Helicobacter pylori antibody.  Maintain Nexium 40 mg p.o. b.i.d.  She is to begin all standard dietary  measures for gastroparesis and GERD.  Written literature is supplied to  her.  Her aerophagia is an acquired behavior that will need to be  discontinued.  It is clearly exacerbating her belching.  Return office  visit with me in 4-6 weeks.  Ongoing followup with Dr. Sandi Mealy.     [name]. Russella Dar, MD, [facility]  Electronically Signed   MTS/MedQ  DD: 06/27/2007  DT: 06/27/2007  Job #: [id number]   cc:   Ancil Boozer, MD [facility]

## 2010-11-16 NOTE — Assessment & Plan Note (Signed)
 [facility]                         GASTROENTEROLOGY OFFICE NOTE   NAME:[name], [name]                     MRN:          [id number]  DATE:07/26/2007                            DOB:          [birth year]    Her frequent belching has improved, mainly at mealtime.  In between  meals she still has extremely frequent episodes of belching.  She  relates epigastric discomfort and bloating as well as chest and back  pain.  Occasionally her epigastric pain is improved with belching.  She  states she has been complaint with metoclopramide and Nexium.  She has  no dysphagia, odynophagia, weight loss, vomiting or change in bowel  habits.   CURRENT MEDICATIONS:  Listed on the chart -updated and reviewed.   MEDICATION ALLERGIES:  ROBITUSSIN, leading to chest pain.   EXAM:  Obese African American female who is frequently belching and  appears to be swallowing air.  Weight 224.2 pounds.  Blood pressure is 130/86, pulse 84 and regular.  She is not reexamined.   ASSESSMENT AND PLAN:  1. Frequent belching, most likely secondary to aerophagia.  I had a      long conversation with her discussing this problem and it is an      unintentionally behavior.  I have asked her to return to see Dr.      Sandi Mealy for referral to a psychologist or psychiatrist to help address      this problem and to modify this behavior.  I have furthermore      advised her to place a plastic pen sideways between her teeth,      touching both sides of her lips, and to hold it there for a few      hours at a time several times a day.  This will prevent inadvertent      swallowing.  2. Gastroparesis and gastroesophageal reflux disease.  Her symptoms      are adequately managed on Nexium b.i.d. and metoclopramide a.c. and      q.h.s.  Continue current management.  3. Chronic constipation.  Continue milk of magnesia or MiraLax on a      regular basis.  4. Ongoing follow-up with Dr. Ancil Boozer.     [name]. Russella Dar, MD, [facility]  Electronically Signed    MTS/MedQ  DD: 07/26/2007  DT: 07/26/2007  Job #: [id number]   cc:   Ancil Boozer, MD

## 2010-11-19 NOTE — Consult Note (Signed)
[facility]  Patient:    Kristin [name], Kristin [name] Visit Number: [id number] MRN: [id number]          Service Type: GYN Location: 4W [id number] Attending Physician:  Soledad Gerlach Dictated by:   Bertram Millard. Dahlstedt, M.D. Proc. Date: 11/07/01 Admit Date:  11/07/2001   CC:         Guy Sandifer. Arleta Creek, M.D.   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  BRIEF HISTORY:  This is a 55 year old female who presented to me with hydronephrosis.  She has a uterine fibroid and endometriosis and is undergoing a total abdominal hysterectomy and bilateral salpingo-oophorectomy by Dr. Henderson Cloud.  During the procedure, a periureteral mass was seen on the right. She had a stent placed by me prior to the hysterectomy.  Dr. Henderson Cloud consulted me for evaluation of this periureteral mass.  DESCRIPTION OF PROCEDURE:  The patients hysterectomy had been completed. Adequate dissection had been carried out in the right pelvic side wall.  There was a fibrous mass approximately 1 x 2 cm in size medial and slightly superior to the distal right ureter.  The ureter could be tracked going into the bladder.  There was no ureteral injury performed during the hysterectomy and bilateral salpingo-oophorectomy.  I carefully inspected the ureter proximal to this area.  It seemed that the ureter was far enough away from the fibrous area where extensive ureterolysis was not necessary.  Distal to this fibrous mass, the ureter was easily tracked into the bladder.  I felt that it would be most advantageous to the patient at this point to leave the ureter in situ.  I feel it best to leave the ureteral catheter indwelling for approximately three months.  DISPOSITION:  The patient will be followed postoperatively for  possible post-obstructive diuresis. Dictated by:   Bertram Millard. Dahlstedt, M.D. Attending Physician:  Soledad Gerlach DD:  11/07/01 TD:  11/10/01 Job: [id number]

## 2010-11-19 NOTE — Op Note (Signed)
   [name], [name]                       ACCOUNT NO.:  [id number]   MEDICAL RECORD NO.:  [id number]                   PATIENT TYPE:  NP   LOCATION:  0445                                 FACILITY:  [facility]   PHYSICIAN:  Bertram Millard. Dahlstedt, M.D.          DATE OF BIRTH:  1956/04/22   DATE OF PROCEDURE:  02/08/2002  DATE OF DISCHARGE:  11/10/2001                                 OPERATIVE REPORT   PREOPERATIVE DIAGNOSIS:  Right ureteral stricture.   POSTOPERATIVE DIAGNOSIS:  Right ureteral stricture.   PROCEDURE:  Cystoscopy, right retrograde ureteral pyelogram, ureteroscopy,  double-J stent placement.   SURGEON:  Bertram Millard. Dahlstedt, M.D.   ANESTHESIA:  General with LMA.   COMPLICATIONS:  None.   BRIEF HISTORY:  [age]-year-old female with right hydronephrosis.  She  had endometriosis and is status post a pelvic procedure and slight  ureterolyses.  Despite this, the patient continues to with hydronephrosis.  She did have a stent prior to her pelvic surgery several months ago.  This  was removed, and the patient was found to have recurrence of hydronephrosis.  She presents at this time for cystoscopy, retrograde pyelogram and stent  placement.   DESCRIPTION OF PROCEDURE:  The patient was administered IV antibiotics  preoperatively and taken to the operating room where general anesthetic was  induced.  She was placed in the dorsal lithotomy position.  Genitalia and  perineum were prepped and draped.  A 25 French panendoscope was advanced  under her bladder which was visibly normal.  The right ureter was  cannulated, and the retrograde performed showing a moderate stricture in the  pelvic ureter on the right.  There was hydronephrosis proximal to this.  A  guidewire was attempted to be placed in the right ureter.  It was difficult  to guide this up into the right pelvis.  I used the ureteroscope to guide  this guidewire and with the ureteroscope removed and replaced  with the  cystoscope.  A 24 cm x 7 French double-J stent was placed.  Deep curls were  seen proximally and distally on the fluoroscopy unit.  At this point, the  bladder was drained and the procedure was terminated.  A B&O suppository was  placed preoperatively .   The patient tolerated the procedure well.  She was awakened and taken to the  PACU in stable condition.                                               Bertram Millard. Dahlstedt, M.D.    SMD/MEDQ  D:  02/08/2002  T:  02/09/2002  Job:  [id number]

## 2010-11-19 NOTE — Discharge Summary (Signed)
[place]  Patient:    Kristin [name], Kristin [name]                    MRN: [id number] Adm. Date:  [id number] Disc. Date: 06/01/00 Attending:  McDiarmid, Leighton Roach. Dictator:   Luna Kitchens, M.D. CC:         Nolon Nations, M.D., [facility]  Onalee Hua L. Annalee Genta, M.D., ENT   Discharge Summary  SERVICE:  [facility] practice teaching service.  CONSULTATIONS:  Otolaryngology.  PROCEDURES:  Head CT showing left mastoiditis and soft tissue edema of the middle ear with no intracranial process and no bony erosion.  DISCHARGE DIAGNOSES:  1. Left otitis externa/otitis media.  2. History of seizure disorder.  3. Peptic ulcer disease.  4. Depression.  5. Tobacco abuse.  6. Hypertension.  DISCHARGE MEDICATIONS:  1. Levaquin 500 mg one p.o. q.d. x 10 days.  2. Motrin 600 mg p.o. t.i.d. with food.  3. Cipro HC four drops in left ear t.i.d. x 10 days.  4. Vicodin 5/500 one to two tabs p.o. q.6h. p.r.n. pain.  5. Dilantin 400 mg every other day/300 mg every other day.  6. Elavil 25 mg p.o. q.h.s.  7. Hydrochlorothiazide 25 mg p.o. q.d.  8. Paxil 40 mg q.a.m.  9. Protonix 40 mg p.o. q.d. 10. Lotensin 40 mg p.o. q.d. 11. MiraLax p.r.n. 12. Compazine 15 mg b.i.d. 13. Belladonna q.d.  DISCHARGE INSTRUCTIONS:  Diet:  Regular.  HISTORY OF PRESENT ILLNESS:  This is a 55 year old African-American female with one-month history of ear pain and treatment for otitis externa.  She had been treated with Cortisporin Otic, hydrocortisone cream, Floxin Otic, Augmentin, Cipro HC, and Vicodin.  On the day of admission, she presented to the [facility] ED with continued ear pain, sinus pressure, facial pain, dizziness, shaking, and unsteady gait.  A CT scan of the head revealed a possible mastoiditis and left middle and external ear infection.  White blood count was normal at 4.6.  Hemoglobin and hematocrit were 11.1 and  31.5 respectively.  HOSPITAL COURSE: #1 - LEFT OTITIS EXTERNA/OTITIS MEDIA:  Otolaryngologist, Dr. Annalee Genta, was consulted.  After reviewing the CT scan and evaluating the patient, he felt mastoiditis was unlikely and that the patients symptoms were primarily due to a left otitis externa and otitis media.  After one day of IV Tequin, the patient was switched to Levaquin and discharged to home.  She is to continue the antibiotics for 10 days in addition to using NSAIDs and topical drops. She was given Vicodin for pain.  She should avoid water exposure and use warm compresses to the ear.  A wick was placed in the left aer which should be removed by the patient on Saturday.  She is to follow up with Dr. Annalee Genta as an outpatient in his clinic on Monday or Tuesday.  His office will call the patient to schedule an appointment.  #2 - LOW WHITE BLOOD COUNT:  Repeat CBC on day #2 of admission revealed a white blood count of 3.8 in the face of infection.  An HIV test was done which was nonreactive.  DISCHARGE INSTRUCTIONS:  Ms. Kristin [name] is to remove the wick from her left ear on Saturday.  She is to apply warm compresses q.i.d. to the left ear.  She was also instructed to avoid showers until her follow-up appointment with Dr. Annalee Genta.  She is to call her doctor or return to the hospital for  high fever, severe headache, or nausea and vomiting.  DISCHARGE FOLLOWUP:  Dr. Clovis Pu office will call the patient to schedule a follow-up appointment on Monday or Tuesday of next week.  The patient is to call the [facility] in 10 days to schedule a follow-up appointment with her primary physician, Dr. Rennis Harding. DD:  06/01/00 TD:  06/01/00 Job: [id number]

## 2010-11-19 NOTE — Op Note (Signed)
NAMECOURNEY, Kristin [name]                        ACCOUNT NO.:  [id number]   MEDICAL RECORD NO.:  [id number]                   PATIENT TYPE:  AMB   LOCATION:  DSC                                  FACILITY:  [facility]   PHYSICIAN:  Feliberto Gottron. Turner Daniels, M.D.                DATE OF BIRTH:  [birth year]   DATE OF PROCEDURE:  04/29/2002  DATE OF DISCHARGE:                                 OPERATIVE REPORT   PREOPERATIVE DIAGNOSIS:  Left carpal tunnel syndrome.   POSTOPERATIVE DIAGNOSIS:  Left carpal tunnel syndrome.   PROCEDURE:  Left carpal tunnel release.   SURGEON:  Feliberto Gottron. Turner Daniels, M.D.   FIRST ASSISTANT:  Skip Mayer, P.A.-C.   ANESTHESIA:  IV regional.   ESTIMATED BLOOD LOSS:  Minimal.   FLUIDS REPLACED:  400 cc of crystalloid.   DRAINS PLACED:  None.   TOURNIQUET TIME:  Approximately 18 minutes.   INDICATIONS FOR PROCEDURE:  [name] with EMG proven left carpal  tunnel syndrome that wakes her up six of seven nights per week with numb  hands despite physical therapy, bracing, antiinflammatory medicines. She  declined a cortisone injection. She also complains of dropping objects and  is subjectively weak.   DESCRIPTION OF PROCEDURE:  The patient identified by arm band, taken to the  operating room at [facility]. Appropriate anesthetic monitors  were attached. IV regional anesthesia induced to the left upper extremity  with a forearm tourniquet block and the left hand prepped and draped in the  usual sterile fashion from the fingertips to the tourniquet. We began the  actual procedure by making a palmar longitudinal incision starting out at  the wrist flexion crease going distally for about 4 cm just to the ulnar  side of the thenar crease of the skin and into the subcutaneous tissue.  Distally a small incision was made in the palmar fascia allowing passage of  a freer elevator into the carpal tunnel which was then kept volar to the  median nerve. We then cut  down on the freer elevator performing a carpal  tunnel release and extended up into the forearm fascia with the black  handled scissors under direct vision. We then explored the carpal tunnel and  the median nerve was noted to have an hourglass configuration as I went  beneath the transverse carpal ligament and the nonflexor tendons were noted  to be intact. No masses or  ganglia encountered in the tunnel. The wound was then washed out with normal  saline solution and the skin only closed with running 5-0 nylon suture. A  dressing of Xeroform, 4 x 4 dressing, sponges, Webril and an Ace wrap  applied. The patient was then taken to the recovery room after letting the  tourniquet down without difficulty.  Feliberto Gottron. Turner Daniels, M.D.    Ovid Curd  D:  04/29/2002  T:  04/29/2002  Job:  [id number]

## 2010-11-19 NOTE — H&P (Signed)
Kristin [name], Kristin [name]                        ACCOUNT NO.:  [id number]   MEDICAL RECORD NO.:  [id number]                   PATIENT TYPE:  INP   LOCATION:  6731                                 FACILITY:  [facility]   PHYSICIAN:  Rodolph Bong, M.D.                  DATE OF BIRTH:  1955-12-28   DATE OF ADMISSION:  02/06/2003  DATE OF DISCHARGE:                                HISTORY & PHYSICAL   CHIEF COMPLAINT:  Seizures.   HISTORY OF PRESENT ILLNESS:  This is a 55 year old African-American female  with a history of seizure disorder.  She presents after an acute episode of  seizure activity today.  She states she was in her usual state of health  when she had a seizure witnessed by her husband.  Stated this lasted  approximately 10-15 minutes.  She described her eyes rolling back in her  head and rhythmic symmetric jerking motions.  She did not have any bowel or  bladder incontinence.  She states she did miss one dose of her Dilantin  therapy about two to three nights ago.  She only has approximately one  seizure per month recently, each lasting only a few seconds.  Family member  does describe a period after the seizure activity where she was confused and  did not recognize anyone.  This lasted between 15-30 minutes.   REVIEW OF SYSTEMS:  No fevers, chills, weight changes.  CARDIOVASCULAR:  No  chest pain or palpitations.  RESPIRATORY:  Positive shortness of breath  today and chronic cough which is nonproductive.  GASTROINTESTINAL:  Positive  for GERD.  No nausea, vomiting, diarrhea.  She has had some constipation,  but no blood in her stools.  SKIN:  There is no active rashes.  PSYCHIATRIC:  States her mood has been stable.  MUSCULOSKELETAL:  She describes some back  pain which is slightly worse today.  Incidentally, she did note some  increasing right shoulder pain while I was in the room.  HEENT:  Eyes:  There is no blurry vision.  ENT:  No sore throat.   PAST MEDICAL HISTORY:  1.  Seizures, not otherwise specified.  2. Major depression.  3. Somatization disorder.  4. Tobacco abuse.  5. Hypertension.  6. Gastroesophageal reflux disease.  7. IBS.   MEDICATIONS:  1. Aciphex 20 mg p.o. daily.  2. Dilantin 300 mg p.o. daily.  3. Effexor XR 75 mg b.i.d.  4. Lotensin 60 mg p.o. daily.   ALLERGIES:  ROBITUSSIN which she reports tachycardia.   FAMILY HISTORY:  She reports diabetes in an aunt and congestive heart  failure in her grandfather.   SOCIAL HISTORY:  There is apparently a poor family situation which  contributes to her depressed emotional state.  She reports tobacco use for  approximately one-half pack per day for 20 years.  She does live with her  husband.   PHYSICAL EXAMINATION:  VITAL SIGNS:  Temperature 99, blood pressure 149/76,  respiratory rate 17, O2 saturation 88% on room air which increases 24% on 2  L nasal cannula.  PSYCHIATRIC:  The patient's judgment and insight are good.  NEUROLOGIC:  Mental status:  She is alert and oriented x4.  HEENT:  Normocephalic, atraumatic.  Pupils are equal, round, and reactive to  light and accommodation.  Conjunctivae are clear.  Tympanic membranes are  pearly grey bilaterally.  Oropharynx is benign.  There is a mild abrasion on  her tongue.  NECK:  Supple.  No thyromegaly or lymphadenopathy.  RESPIRATORY:  Nonlabored, good effort.  Clear to auscultation bilaterally.  Slightly better air movement on the left than right.  There is no wheezes  noted.  CARDIOVASCULAR:  She has a regular rate and rhythm.  No audible murmurs.  MUSCULOSKELETAL:  She does have significant tenderness to palpation of the  right shoulder and elbow and there are no joint abnormalities identified.  GASTROINTESTINAL:  There is no masses or tenderness.  She is soft and no  rebound or guarding.  SKIN:  No rashes noted.  NEUROLOGIC:  Nonfocal except for some slight grip strength decreased in the  right hand secondary to pain.    LABORATORIES:  Dilantin level decreased to 7.6.  Her urine drug screen was  negative.  I-stat laboratories revealed sodium 139, potassium 3.5, chloride  107, BUN 13, glucose 111.  I-stat ABG reveals pH 7.369, pCO2 47.4, pO2 78,  bicarbonate 27, O2 saturation 95% on 2 L nasal cannula.  Portable chest x-  ray shows some left lung interstitial versus air space disease, questionable  asymmetrical edema versus infection versus aspiration event.  There is no  pulmonary emboli or DVT noted with the scans to her lower extremities.   ASSESSMENT/PLAN:  This is a 55 year old African-American female with a  history of seizure disorder who presents with atypical seizure today and  subsequent hypoxemia.  1. Seizures.  Atypical episode per family report.  She does have a     subtherapeutic Dilantin level.  We will semi load her with Dilantin 400     mg p.o. x1 and repeat 300 mg in three hours.  Then will resume her daily     doses.  Will place her on seizure precautions.  2. Hypoxemia.  Questionable etiology with an A gradient of approximately 55.     Question chronic obstructive pulmonary disease as a component as she does     have a 10-pack-year history of smoking.  Will place on supplemental O2.     Recheck an ABG in a.m. and wean O2 if tolerated.  3. Right shoulder pain, question injury during seizure activity.  Will check     shoulder and elbow films as patient is also tender in her elbow.     Morphine is temporarily required to control pain.  4. Depression.  Will continue her Effexor.  5. Hypertension.  Continue her Lotensin.  6. Gastroesophageal reflux disease.  Will place on Protonix.                                                Rodolph Bong, M.D.    AK/MEDQ  D:  02/07/2003  T:  02/07/2003  Job:  [id number]

## 2010-11-19 NOTE — Discharge Summary (Signed)
Kristin [name], Kristin [name]                        ACCOUNT NO.:  [id number]   MEDICAL RECORD NO.:  [id number]                   PATIENT TYPE:  OBV   LOCATION:  6731                                 FACILITY:  [facility]   PHYSICIAN:  Leighton Roach McDiarmid, M.D.             DATE OF BIRTH:  04/03/1956   DATE OF ADMISSION:  02/06/2003  DATE OF DISCHARGE:  02/07/2003                                 DISCHARGE SUMMARY   DISCHARGE DIAGNOSES:  1. Seizures.  2. Hypoxemia.  3. Right shoulder pain.  4. Depression.  5. Hypertension.  6. Gastroesophageal reflux disease.   DISCHARGE MEDICATIONS:  1. Flexeril 10 mg b.i.d.  2. Trazodone 50 mg, 1/2 tablet q.h.s.  3. Aciphex 20 mg q.d.  4. Dilantin 100 mg, 3 tabs q.d.  5. Effexor XR 75 mg b.i.d.  6. Lotensin 40 mg, 1-1/2 tabs q.d.  7. Vioxx 50 mg q.d.   FOLLOW UP:  The patient is to call Dr. Sandria Manly for an appointment in the next  week and she will follow up with Dr. Merilynn Finland on February 24, 2003 at 3:25  p.m., at which time she will need her Dilantin level checked.   HISTORY OF PRESENT ILLNESS:  This 55 year old female with seizure disorder  presented with a seizure witnessed by her husband, lasting 10-15 minutes  without bowel or bladder incontinence.  She stated she missed one dose of  Dilantin 2-3 nights prior to admission and normally has about one seizure  per month.  See dictated H&P for further details.   HOSPITAL COURSE:  PROBLEM #1 - SEIZURE:  The patient was alert and oriented  on admission and Dilantin level was checked which was low at 7.6 and she was  given a Dilantin load and then started on 300 mg daily.  Seizure was thought  to be secondary to subtherapeutic Dilantin level and she will follow up with  further titration per Dr. Sandria Manly.   PROBLEM #3 - HYPOXEMIA:  On admission, the patient had an AA gradient of 55  and a chest x-ray was obtained which showed mild left lung airspace disease.  A CT of the chest was obtained which ruled out  pulmonary emboli.  Hypoxemia  resolved and the patient, on the day of admission had an oxygen saturation  of 94% on room air and she was in no respiratory distress.  She may benefit  from pulmonary function tests and peak flows as an outpatient.  Her  respiratory depression may also have been secondary to postictal state.   PROBLEM #3 - RIGHT SHOULDER PAIN:  There was questionable injury during  seizure activity.  Shoulder and elbow x-rays were obtained which were within  normal limits and negative for fractures.  The patient will continue Vioxx  and ibuprofen for pain control.       Billey Gosling, M.D.  Etta Grandchild, M.D.    AS/MEDQ  D:  03/10/2003  T:  03/10/2003  Job:  [id number]   cc:   Altamease Oiler C. Merilynn Finland, M.D.  Fam. Med - Resident - [place], [place] [address]  Fax: [phone]

## 2010-11-19 NOTE — Assessment & Plan Note (Signed)
[facility]                           GASTROENTEROLOGY OFFICE NOTE   Kristin [name], Kristin [name]                     MRN:          [id number]  DATE:04/11/2006                            DOB:          06-Mar-1956    REFERRING PHYSICIAN:  Dr. Roddie Mc B. Debbora Presto [facility].   REASON FOR REFERRAL:  Epigastric pain, GERD, and constipation.   HISTORY OF PRESENT ILLNESS:  Kristin [name] is a 55 year old white female who  has had a history of GERD and irritable bowel syndrome. Her irritable bowel  syndrome has been manifested by constipation, gas, bloating and abdominal  pain. She generally needs to use a laxative on the order of once a week to  induce bowel movements. She has a family history of colon cancer. Her father  and grandfather both had colon cancer in their 68s. Over the past few  months, she has had a significant problem with frequent belching that had  been difficult to control. Her Nexium was increased to 40 mg p.o. b.i.d.  without control of her symptoms. She belches multiple times each day. She  does take Naprosyn on a regular basis but denies any other aspirin or NSAID  usage. She has no dysphagia, odynophagia, melena, hematochezia and no change  in bowel habits.   PAST MEDICAL HISTORY:  Major depression, somatization disorder, carpal  tunnel syndrome, hypertension, GERD, history of gastric ulcer, irritable  bowel syndrome, history of seizures, low back pain, headaches,  hyperlipidemia, status post cholecystectomy in 1994, status post renal  artery angioplasty, status post hysterectomy 2000, status post benign breast  biopsy.   CURRENT MEDICATIONS:  Listed on the chart have been reviewed.   ALLERGIES:  ROBITUSSIN.   SOCIAL HISTORY AND REVIEW OF SYSTEMS:  Per the handwritten form.   PHYSICAL EXAMINATION:  GENERAL:  Well-developed, overweight, African-  American female who is frequently belching.  VITAL SIGNS:   Height 5 feet 3 inches, weight 210.4 pounds. Blood pressure is  132/84, pulse 60 and regular.  HEENT:  Anicteric sclera, oropharynx clear.  CHEST:  Clear to auscultation bilaterally.  CARDIAC:  Regular rate and rhythm without murmurs appreciated.  ABDOMEN:  Soft, nontender, nondistended, normal active bowel sounds, no  palpable organomegaly, masses or hernias.  RECTAL:  Deferred until time of colonoscopy.  NEUROLOGIC:  Alert and oriented x3. Grossly nonfocal.   ASSESSMENT/PLAN:  1. Frequent belching refractory to Nexium and antireflux measures. Rule      out aerophagia. Need to exclude gastroparesis, ulcer disease,      esophagitis and other disorders. Maintain Nexium 40 mg p.o. b.i.d.      along with antireflux measures. The risks, benefits, and alternatives      to upper endoscopy with possible biopsy discussed with the patient and      she consents to proceed. This will be scheduled electively.  2. Strong family history of colon cancer with ongoing constipation. Rule      out colorectal neoplasms. The risks, benefits, and alternatives to      colonoscopy with possible biopsy and possible polypectomy discussed  with the patient and she consents to proceed. This will be scheduled to      electively.       Kristin Lick. Russella Dar, MD, [facility]      MTS/MedQ  DD:  04/17/2006  DT:  04/17/2006  Job #:  [id number]   cc:   Tawnya Crook Erenest Rasher, M.D.

## 2010-11-19 NOTE — Discharge Summary (Signed)
[facility]  Patient:    Kristin [name], Kristin [name] Visit Number: [id number] MRN: [id number]          Service Type: GYN Location: 4W 0445 02 Attending Physician:  Soledad Gerlach Dictated by:   Guy Sandifer Arleta Creek, M.D. Admit Date:  11/07/2001 Discharge Date: 11/10/2001   CC:         Bertram Millard. Dahlstedt, M.D.   Discharge Summary  REASON FOR ADMISSION:  This patient is a 55 year old married black female, G4, P3, status post tubal ligation who has an enlarging fibroid uterus, right ureteral compression, and right-sided hydronephrosis.  Patient admitted for definitive surgical management.  On Nov 07, 2001, she undergoes total abdominal hysterectomy with a bilateral salpingo-oophorectomy and a preoperative cystoscopy and right retrograde ureteral stent with Dr. Lynnae Sandhoff.  Estimated blood loss was 300 cc.  HOSPITAL COURSE:  Patient undergoes the above procedures without complication. On the night of surgery she has good pain relief.  Vital signs are stable with good urine output.  Urine is blood tinged. On the first postoperative day, she complains of some itching which may have been secondary to Ancef she recieved for the first 24 hours postoperatively.  She remains afebrile.  White count is 5.5, hemoglobin 10.7.  Abdomen is soft with positive bowel sounds though they are somewhat decreased. On Nov 09, 2001, shes ambulating well but not yet passing flatus and complaining of gas pain.  During the previous evening, gas pains had worsened requiring the resumption of the PCA for pain relief. However, she is tolerating liquids well and has no nausea or vomiting.  She remains afebrile.  Abdomen is soft with excellent bowel sounds and the dressing is clean and dry.  Orders are given for Dulcolax suppository and to advance diet and if tolerating that to discharge home.  However, the Dulcolax suppository fails to produce results after several hours.  At that  point, abdominal x-rays ordered.  However prior to getting x-rays done, the patient begins to pass flatus and begins to feel better.  The order for the x-ray is cancelled.  Her diet is advanced, she tolerates this well.  On the day of discharge, she remains afebrile.  She is maintained on her oral Dilantin after discontinuation of the IV.  She recieves 400 mg on the evening of Nov 09, 2001. Condition on discharge good, diet regular as tolerated. Activity:  No lifting or operation of automobiles; no vaginal entry.  MEDICATIONS: 1. Percocet 5/325 mg #30 one two p.o. q.6h. prn. 2. Ibuprofen 600 mg p.o. q.6h. p.r.n. 3. Dulcolax. 4. Multivitamin daily.  FOLLOW-UP:  My office in one week and with Dr. Lynnae Sandhoff as prescribed.  DIET:  Regular as tolerated. Dictated by:   Guy Sandifer Arleta Creek, M.D. Attending Physician:  Soledad Gerlach DD:  11/10/01 TD:  11/12/01 Job: [id number]

## 2010-11-19 NOTE — Op Note (Signed)
Kristin [name], Kristin [name]                        ACCOUNT NO.:  [id number]   MEDICAL RECORD NO.:  [id number]                   PATIENT TYPE:  AMB   LOCATION:  DSC                                  FACILITY:  [facility]   PHYSICIAN:  Feliberto Gottron. Turner Daniels, M.D.                DATE OF BIRTH:  Jan 07, 1956   DATE OF PROCEDURE:  05/20/2002  DATE OF DISCHARGE:                                 OPERATIVE REPORT   PREOPERATIVE DIAGNOSIS:  Right carpal tunnel syndrome.   POSTOPERATIVE DIAGNOSIS:  Right carpal tunnel syndrome.   PROCEDURE:  Right carpal tunnel release.   SURGEON:  Feliberto Gottron. Turner Daniels, M.D.   ASSISTANTLaural Benes. Jannet Mantis.   ANESTHESIA:  IV regional.   ESTIMATED BLOOD LOSS:  Minimal.   FLUIDS REPLACED:  500 cc of crystalloid.   DRAINS:  None.   TOURNIQUET TIME:  20 minutes.   INDICATION FOR PROCEDURE:  [name] who underwent a successful  left carpal tunnel release two weeks ago with good relief of symptoms.  Desires same for the right side, where she had the same symptoms that she  did on the left.   DESCRIPTION OF PROCEDURE:  The patient identified by arm band, taken to the  operating room at [facility] day surgery center, appropriate anesthetic monitors  were attached, and IV regional anesthesia induced at the level of the right  forearm, right hand prepped and draped in the usual sterile fashion from the  fingertips to the forearm tourniquet.  We began the procedure by making a  volar midline incision starting out at the wrist flexion crease just to the  ulnar side of the thenar crease and going distally for about 4 cm through  the skin and subcutaneous tissue.  Small bleeders were identified and  cauterized with the bipolar.  Distally we made a small incision on the  transverse palmar fascia and attached a Therapist, nutritional into the carpal  tunnel, keeping it volar.  We then cut down on the [facility] elevator with a 15  blade, keeping the edges of the wound under tension to avoid  any damage to  the neural structures.  Once we got to the proximal end of the wound, the  incision into the fascia was extended up into the forearm with a black-  handled scissors, being careful to keep traction on the skin and the fascia  volarly to keep it off of the contents of the carpal tunnel.  We then  carefully visually inspected the carpal tunnel, found a little bit of an  hour glass configuration to the median nerve.  The tendons were intact.  The  nerve itself was intact.  The wound was washed  out with normal saline solution and then the skin only was closed with  running 5-0 nylon suture.  A dressing of Xeroform, 4 x 4 dressing sponges,  Webril, and an Ace wrap applied.  The patient awakened and taken to the  recovery room without difficulty.                                               Feliberto Gottron. Turner Daniels, M.D.    Ovid Curd  D:  05/20/2002  T:  05/20/2002  Job:  [id number]

## 2010-11-19 NOTE — Op Note (Signed)
[facility]  Patient:    Kristin [name], Kristin [name] Visit Number: [id number] MRN: [id number]          Service Type: GYN Location: 4W 0445 02 Attending Physician:  Soledad Gerlach Dictated by:   Guy Sandifer Arleta Creek, M.D. Proc. Date: 11/07/01 Admit Date:  11/07/2001                             Operative Report  PREOPERATIVE DIAGNOSES:  1. Uterine leiomyomata.  2. Dysmenorrhea.  POSTOPERATIVE DIAGNOSES:  1. Uterine leiomyomata.  2. Endometriosis.  PROCEDURE:  Total abdominal hysterectomy with bilateral salpingo-oophorectomy.  SURGEON:  Guy Sandifer. Arleta Creek, M.D.  ASSISTANT:  Juluis Mire, M.D.  ANESTHESIA:  General with endotracheal intubation.  ESTIMATED BLOOD LOSS:  300 cc.  INDICATIONS AND CONSENTS:  The patient is a 55 year old married black female, G4, P3 status post tubal ligation with dysmenorrhea and uterine enlargement as outlined in the history and physical. She also has partial ureteral obstruction on the right for which Dr. Retta Diones will place a ureteral stent. Total abdominal hysterectomy with bilateral salpingo-oophorectomy has been discussed with the patient. The potential risks and complications have been discussed including but not limited to infection, bowel, bladder or ureteral damage, bleeding requiring transfusion of blood products is possible with transfusion reaction, HIV and hepatitis acquisition, DVT, PE, pneumonia, dyspareunia, and postoperative pain. All questions have been answered and consent is signed on the chart.  FINDINGS:  The upper abdomen palpates grossly normally. The uterus is 8-10 weeks in size with uterine leiomyomata. The ovaries are adherent to the pelvic sidewalls bilaterally. There are implants of endometriosis in the pelvis.  DESCRIPTION OF PROCEDURE:  The patient is taken to the operating room, placed in the dorsal supine position. The general anesthesia is induced via endotracheal intubation.  She is then placed in the dorsal lithotomy position. She then has cystoscopy and right ureteral stent placement per Dr. Retta Diones which is dictated under a separate note. She is then placed in the dorsal supine position where she is prepped abdominally and vaginally, Foley catheter is placed, the bladder is drained and she is draped in a sterile fashion. A Pfannenstiel incision is made and dissection is carried out in layers in the peritoneum. The peritoneum is incised and extended superiorly and inferiorly. The OSullivan-OConnor retractor is placed. A bladder blade is placed, the upper bowel is packed away and the upper blade is placed. The ovaries are elevated bilaterally with blunt dissection. Careful palpation reveals a knot of apparent inflammatory tissue that is immediately superior to the right uterosacral ligament. The course of the right ureter with the stent can be palpated immediately below this thickened tissue. The left round ligament is identified, ligated with #0 monocryl suture and taken down. All suture will be #0 monocryl unless otherwise designated. The anterior leaf of the broad ligament is taken down, the posterior leaf is bluntly perforated after dissecting the retroperitoneal space and identifying the left ureter to be well clear of the area of surgery. The infundibulopelvic ligament is then taken down and doubly ligated first with a free tie and then with a suture. The bladder flap is advanced, the left uterine vessels are skeletonized and the left uterine vessels are then taken down and singly ligated. The right round ligament is then taken down. The retroperitoneal space is developed. After careful inspection and palpation, the right infundibulopelvic ligament is isolated with a right angle and  then cross clamped with a Heaney clamp. The ligament is then taken down and doubly ligated with free tie and then with a suture. Again with careful sharp and blunt  dissection, this area of thickened tissue is taken down and advanced to release the right ureter well clear of the area of surgery. This skeletonizes the right uterine vessels which are then taken down and singly ligated. The bladder flap is carefully advanced, the cardinal ligaments are taken bilaterally. The left uterosacral ligament and left vaginal fornix is entered and the pedicle is ligated with a transfixion suture. A similar procedure is carried out on the right and the specimen is totally released. The vaginal cuff is then closed with figure-of-eight sutures. Again careful inspection reveals the right ureter to come into close approximation with this area of apparent inflammatory response. Dr. Retta Diones is called back into the room and he scrubs and then palpates this area and investigates. A decision is made to leave things as is. The uterosacral ligaments are then plicated in the midline with a single suture. Copious irrigation is carried out and good hemostasis is obtained. Packs and instruments are removed. The anterior peritoneum is closed in a running fashion with #0 monocryl suture which is also used to reapproximate the pyramidalis muscle in the midline. The anterior rectus muscle is closed in running fashion with #0 PDS suture and the skin is closed with clips. All sponge, needle and instrument counts are correct and the patient is transferred to the recovery room in stable condition. Dictated by:   Guy Sandifer Arleta Creek, M.D. Attending Physician:  Soledad Gerlach DD:  11/07/01 TD:  11/07/01 Job: [id number]

## 2010-11-19 NOTE — Discharge Summary (Signed)
Kristin [name], Kristin [name]                        ACCOUNT NO.:  [id number]   MEDICAL RECORD NO.:  [id number]                   PATIENT TYPE:  INP   LOCATION:  3712                                 FACILITY:  [facility]   PHYSICIAN:  Asencion Partridge, M.D.                  DATE OF BIRTH:  15-May-1956   DATE OF ADMISSION:  06/13/2002  DATE OF DISCHARGE:  06/13/2002                                 DISCHARGE SUMMARY   DISCHARGE DIAGNOSES:  1. Right-sided chest pain, likely costochondritis.  No evidence for acute     myocardial infarction.  2. Tachycardia secondary to caffeine intake.  3. Hypertension, stable.  4. Gastroesophageal reflux disease, stable.  5. Irritable bowel syndrome, stable.  6. Depression, stable.   DISCHARGE MEDICATIONS:  1. Ibuprofen 600 mg tablet 1 p.o. q.6h. p.r.n. pain.  2. Percocet 5/325 one q.6h. p.r.n. pain not relieved by ibuprofen.  3. The patient was instructed to continue outpatient home medicines as     previously prescribed to include Dilantin, Effexor, Lotensin, __________,     Nexium, Zelnorm, and MiraLax.   DISCHARGE INSTRUCTIONS:  The patient was instructed to decreased caffeine  intake and try to avoid fiber which contains caffeine.   The patient was to call family practice center to make an appointment to see  Dr. Merilynn Finland in the next two to three weeks.   HOSPITAL COURSE:  1. Right-sided chest pain:  The patient is a 55 year old female admitted for     right-sided chest pain and tachycardia.  Due to risk factors of     hypertension, obesity, and smoking, an acute MI was ruled out with serial     enzymes and EKG.  At the time of discharge, the patient's chest pain was     resolving and was likely secondary to costochondritis.  She had a normal     D-dimer of 0.25 but doubt a possible PE or DVT.  Chest x-ray was normal     without one mediastinum aneurysm highly doubtful.  The patient was     discharged on ibuprofen and Percocet for pain.   1.  Tachycardia:  Due to caffeine intake of Vigran and tea prior to     admission, it was felt this might be contributing factors to tachycardia     on admission.  At discharge, the patient's pulse was in the 90s.     Recommended decreased caffeine intake.   1. Hypertension:  Controlled on Lotensin.  Discharge blood pressure was not     ideal at 130/90s, but this can be further adjusted as an outpatient with     continued followup of blood pressure.   1. Depression:  The patient was continued on Effexor and had no issues     during hospital course.   1. Gastroesophageal reflux disease:  The patient was continued on Protonix     40 mg q.d.  1. Irritable bowel syndrome:  Continue on Zelnorm without an issues.   DISPOSITION:  The patient was discharged to home with previously described  prescription and followup.     Douglass Rivers, M.D.                      Asencion Partridge, M.D.    CH/MEDQ  D:  07/05/2002  T:  07/06/2002  Job:  [id number]   cc:   Dr. Merilynn Finland

## 2010-11-19 NOTE — H&P (Signed)
NAMEJAYDIN, Kristin [name]                        ACCOUNT NO.:  [id number]   MEDICAL RECORD NO.:  [id number]                   PATIENT TYPE:  INP   LOCATION:  1824                                 FACILITY:  [facility]   PHYSICIAN:  Douglass Rivers, M.D.                DATE OF BIRTH:  [birth year]   DATE OF ADMISSION:  06/12/2002  DATE OF DISCHARGE:                                HISTORY & PHYSICAL   CHIEF COMPLAINT:  Right-sided chest pain. June 11, 2002   HISTORY OF PRESENT ILLNESS:  The patient is a 55 year old woman who  presented to the [facility] with a complaint of  right-sided chest pain since yesterday. She said she started feeling achy  approximately two days prior and then one day  prior. She was sitting in a  doctor's office and had right-sided chest and back pain. She says that it is  intermittent. The pain is relieved with Tylenol. It is worse with deep  breaths. The intensity is 7 to 8/10. She  characterizes it as  tightening/burning.  The patient initially thought it was indigestion but  is worse. The pain does not change on exertion and is not positional. With  regards to any change in medications, she admits to taking one Vivarin  (caffeine pill) and drinking four cups of coffee today. She has been  inactive for the past few weeks secondary to having surgery on her right  wrist.   REVIEW OF SYSTEMS:  Significant for sick contacts with flu and bronchitis.  CARDIOVASCULAR:  No palpitations or chest pain with exertion or  claudication. RESPIRATORY:  Cough, positive for shortness of breath. GI:  No  vomiting or diarrhea. Plus some nausea on the day prior. Some constipation  with severe belching. SKIN:  No rashes or lesions. NEUROLOGIC:  Negative.  PSYCH:  Positive history for depression. No panic disorder.  MUSCULOSKELETAL:  Negative. HEENT: No visual changes, she wears glasses. No  rhinorrhea or sore throat.  ENDOCRINE: Does not have diabetes.  HEMATOLOGY:  Positive for easy bruising.   PAST MEDICAL HISTORY:  1. Epilepsy.  2. Depression.  3. Some motivation disorder.  4. Carpal tunnel syndrome, status post surgical treatment bilaterally.  5. Hypertension.  6. Reflux esophagitis, history of  gastric ulcer.  7. History of hiatal hernia.  8. Constipation with a negative workup except gastroesophageal reflux     disease.  9. History of mastoiditis for which she was hospitalized.   MEDICATIONS:  1. Dilantin 300 mg q.d.  2. Effexor XR 75 mg b.i.d.  3. Lotensin 60 mg q.d.  4. Nexium 40 mg q.d.  5. Gynodiol 1 mg q.d.  6. Relax q.d.  7. Zelnorm 6 mg q.d.   ALLERGIES:  ROBITUSSIN CAUSES A RAPID HEARTBEAT AND CHEST PAIN.   PAST SURGICAL HISTORY:  1. Laparoscopic cholecystectomy in 1996.  2. Bilateral tubal ligation in 1991.  3. Complete hysterectomy  for fibroids in September 2003.  4. History of kidney stenting in September 2003.  5. She had a peritonsillar abscess that was incised and drained in October     1999.   SOCIAL HISTORY:  She is married and lives with her husband. She smokes one  half pack of cigarettes per day. She denies any alcohol use. She has three  children and four grandchildren.   FAMILY HISTORY:  Her mother is deceased at age 11 due to abortion  complications and father is not deceased but does not know his health  history. She has an aunt with diabetes and a grandfather had congestive  heart failure.   PHYSICAL EXAMINATION:  VITAL SIGNS:  Blood pressure 138/80, pulse 114, pulse  oximetry 97% on room air. Weight 214.4 pounds.  GENERAL:  She is an obese black female  in mild distress secondary to pain  She hesitates  to take deep breaths and was pressing on her chest under her  right breast.  HEENT: Normal.  NECK:  No JVD, no lymphadenopathy, no thyromegaly.  CARDIOVASCULAR:  Regular rate and rhythm, tachycardic with no murmurs, rubs,  gallops. Bowel sounds are audible in the chest.  LUNGS:  No  rales, rhonchi or wheezes. The patient is taking soft, shallow  breaths.  EXTREMITIES:  There is some trace ankle edema but bilateral lower  extremities are symmetric. Homan's sign is negative. There is no  claudication.  GI:  Abdomen is soft, nontender, nondistended with no masses.  SKIN:  Normal.  NEUROLOGIC:  No focal deficits.   LABORATORY DATA:  An EKG reveals sinus tachycardia with T-wave flattening  inversions in the inferior leads. A chest x-ray was normal.   A CBC was normal.   IMPRESSION AND PLAN:  A 55 year old black female with right-sided chest pain  with the risk factors for coronary artery disease including hypertension,  obesity and smoking.   1. Chest pain. The differential diagnosis includes acute myocardial     infarction. Risk factors for this include hypertension, obesity and     smoking. Due to these risk factors, will plan to rule out. Will admit to     telemetry, obtain serial enzymes, morning EKG and consider risk     stratification study.  2. Pulmonary embolus. The risk factors for this include obesity, recent     inactivity and smoking. This is also less likely, however, with the     pleuritic chest pain and tachycardia. Will initially evaluate this with a     D-dimer and may consider a spiral CT scan if the D-dimer is positive.     Will check an ABG to look for an AA  gradient.  1. Gastroesophageal reflux disease. The patient reportedly did not find     relief from a GI cocktail.  2. Musculoskeletal pain.  3. Pneumonia. Although unlikely with a normal chest x-ray.  4. Aortic aneurysm unlikely with a normal chest x-ray.  5. Question a possible referred pain from a right upper quadrant source,     however, this is less likely as the patient is status post     cholecystectomy. Will check a CMP for any GI process.  6. Seizure disorder, continue Dilantin.  7. Hypertension, under adequate control, will continue Lotensin. 8. Depression, continue Effexor.   9. Gastroesophageal reflux disease,will continue Protonix 40 q.d.  10.      Will continue Zelnorm.  11.      Sinus tachycardia, likely secondary to caffeine, but will  rule out     a pulmonary embolus and myocardial infarction.  12.      She is a full code status.                                               Douglass Rivers, M.D.    CH/MEDQ  D:  06/12/2002  T:  06/12/2002  Job:  [id number]   cc:   Altamease Oiler C. Merilynn Finland, M.D.  Fam. Med - Resident - [place], [place] [address]  Fax: [phone]

## 2010-11-19 NOTE — Op Note (Signed)
[facility]  Patient:    Kristin [name], Kristin [name] Visit Number: [id number] MRN: [id number]          Service Type: GYN Location: 4W 0445 02 Attending Physician:  Soledad Gerlach Dictated by:   Bertram Millard. Dahlstedt, M.D. Proc. Date: 11/07/01 Admit Date:  11/07/2001   CC:         Corwin Levins, M.D. [facility]  Guy Sandifer. Arleta Creek, M.D.   Operative Report  PREOPERATIVE DIAGNOSIS:  Pelvic mass with right hydronephrosis.  POSTOPERATIVE DIAGNOSIS:  Pelvic mass with right hydronephrosis.  PRINCIPAL PROCEDURE:  Cystoscopy, right retrograde ureteropyelogram, double J stent placement.  SURGEON:  Bertram Millard. Dahlstedt, M.D.  ANESTHESIA:  General with endotracheal.  COMPLICATIONS:  None.  BRIEF HISTORY:  This 55 year old female has large fibroid and possible endometriosis. She was diagnosed recently with hydronephrosis. The patient has right sided pelvic pain.  She is scheduled for TAH/BSO by Dr. Henderson Cloud. Because of this hydronephrosis, it was recommended that she undergo cysto and retrograde ureteropyelogram. She is aware of the risks and complications. A stent will be placed to assist with surgery and with adequate kidney drainage postoperatively.  DESCRIPTION OF PROCEDURE:  General endotracheal anesthetic was administered after IV antibiotics were administered. She was placed in the dorsal lithotomy position. The genitalia and perineum were prepped and draped. A 25 French panendoscope was advanced into the bladder. The bladder appeared normal.  The right ureteral orifice was cannulated and a retrograde was performed showing a normal distal ureter to about 4 cm up the ureter. At this point, there was a significant stricture and mild tortuosity. Superior to this stricture was significant hydroureter. I did not feel the entire ureter above this as it was quite capacious and had already taken a fair amount of contrast to fill the lower ureter. I then placed  a guidewire fluoroscopically into the right renal pelvis and advanced a 6 French x 24 cm double J stent. Adequate positioning was seen fluoroscopically and cystoscopically. The bladder was drained and the procedure terminated.  Dr. Henderson Cloud commended with the hysterectomy at this point. Dictated by:   Bertram Millard. Dahlstedt, M.D. Attending Physician:  Soledad Gerlach DD:  11/07/01 TD:  11/08/01 Job: [id number]

## 2010-11-19 NOTE — Cardiovascular Report (Signed)
[name], Kristin [name]              ACCOUNT NO.:  [id number]   MEDICAL RECORD NO.:  [id number]          PATIENT TYPE:  OIB   LOCATION:  2899                         FACILITY:  [facility]   PHYSICIAN:  Ricki Rodriguez, M.D.  DATE OF BIRTH:  01-Jun-1956   DATE OF PROCEDURE:  09/30/2005  DATE OF DISCHARGE:  09/30/2005                              CARDIAC CATHETERIZATION   PROCEDURE:  Left heart catheterization, selective coronary arteriography,  left ventricular function study.   INDICATION:  This 55 year old black female had recurrent chest pain,  abnormal stress test along with a cardiac risk factors of hypertension,  smoking, elevated cholesterol level, obesity, and family history of  premature coronary artery disease.   APPROACH:  Right femoral artery using 4-French sheath and catheters.   COMPLICATIONS:  None.   Less than 40 mL of dye was used.   HEMODYNAMIC DATA:  The left ventricular pressure was 123/10 and the aortic  pressure was 123/76.   CORONARY ANATOMY:  1.  The left main coronary artery was short and unremarkable.  2.  The left anterior descending coronary artery was essentially      unremarkable and its diagonal branch was also unremarkable.  3.  The left circumflex coronary artery had a very small ramus branch and a      small OM-1 and OM-2 branches. The OM-3 was a larger vessel. The left      circumflex itself was unremarkable.  4.  The right coronary artery was dominant and essentially unremarkable with      a normal posterolateral and posterior descending coronary artery.   LEFT VENTRICULOGRAM:  The left ventriculogram showed generalized hypokinesia  with mildly dilated left ventricle and an ejection fraction of 40% to 45%.   IMPRESSION:  1.  Normal coronaries.  2.  Mild left ventricular systolic dysfunction.   RECOMMENDATIONS:  This patient will be treated medically. Will decrease the  dose of Norvasc and add ACE inhibitor, and angiotensin-receptor blocker as  tolerated.     Ricki Rodriguez, M.D.  Electronically Signed    ASK/MEDQ  D:  09/30/2005  T:  10/01/2005  Job:  [id number]

## 2010-12-01 ENCOUNTER — Ambulatory Visit (INDEPENDENT_AMBULATORY_CARE_PROVIDER_SITE_OTHER): Payer: Self-pay | Admitting: Family Medicine

## 2010-12-01 ENCOUNTER — Encounter: Payer: Self-pay | Admitting: Family Medicine

## 2010-12-01 DIAGNOSIS — M545 Low back pain: Secondary | ICD-10-CM

## 2010-12-01 DIAGNOSIS — M67439 Ganglion, unspecified wrist: Secondary | ICD-10-CM

## 2010-12-01 DIAGNOSIS — M6740 Ganglion, unspecified site: Secondary | ICD-10-CM

## 2010-12-01 MED ORDER — LIDOCAINE 5 % EX PTCH: 1.0000 | MEDICATED_PATCH | Freq: Two times a day (BID) | CUTANEOUS | Status: DC

## 2010-12-01 NOTE — Assessment & Plan Note (Signed)
Chronic low back pain, hip pain.  Will prescribe lidoderm patches.  Would avoid narcotics and long term NSAIDS (single kidney)  She has tried gabapentin in the past and unable to tolerate due to somnolence, has tried lyrica.  Reluctant to start other neurpathic medications due to also taking carbamaxepine, phenytoin, and venlafaxine.  I urged her to follow-up with PCP to discuss long term plan for managing pain.  Discussed that will have some component of pain, and with her doctor will work on combination of medication and exercise to help control pain.

## 2010-12-01 NOTE — Progress Notes (Signed)
  Subjective:    Patient ID: Kristin [name], female    DOB: [birth year], 55 y.o.   MRN: [id number]  HPIhere for work in appt for chronic right leg pain  Worsening, no new physical activities or injury.  Pain at low back, hip, radiated down lateral leg down to mid calf.  No numbness, weakness, tingling.    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  Takes flexeril and venlafaxine currently as well as her husband's hydrocodone and pain patches with some relief.  Finds no relief with OTC aleve or tylenol.   Records reviewed: Has bilateral hip arthritis seen on xray 04/16/2011 MR L spine 01/10/09 shows moderate stenisis at L5-S1 and l5-s2 facet arthropathy   Review of Systems  Constitutional: Negative for fever and chills.  Musculoskeletal: Positive for back pain.  Neurological: Negative for weakness and numbness.  no change in bowel or bladder function     Objective:   Physical Exam  Constitutional: She appears well-developed and well-nourished.  Cardiovascular: Normal rate and regular rhythm.   No murmur heard. Pulmonary/Chest: Effort normal and breath sounds normal. No respiratory distress.  Abdominal: Soft. Bowel sounds are normal. She exhibits no distension. There is no tenderness. There is no rebound and no guarding.  Musculoskeletal:       Thoracic back: Normal.       Lumbar back: She exhibits tenderness and pain.  Neurological: She has normal strength and normal reflexes.   Back Exam: Inspection: no curvature SLR lying: pos   XSLR lying: neg Palpable tenderness: at rigth piriformis, low back, and over hip. FABER: + Sensory change: no Reflex change: no Strength normal bilaterally        Assessment & Plan:

## 2010-12-01 NOTE — Assessment & Plan Note (Signed)
Has some hypopigmentation from steroid injection.  Patient pain improved.

## 2010-12-01 NOTE — Patient Instructions (Signed)
Lidoderm patch for pain Dont forget- tylenol arthritis can be very helpful Follow-up with your PCP to discuss chronic pain management

## 2010-12-13 ENCOUNTER — Other Ambulatory Visit: Payer: Self-pay | Admitting: Family Medicine

## 2010-12-13 MED ORDER — ROSUVASTATIN CALCIUM 20 MG PO TABS: 10.0000 mg | ORAL_TABLET | Freq: Every day | ORAL | Status: DC

## 2010-12-23 ENCOUNTER — Other Ambulatory Visit: Payer: Self-pay | Admitting: Family Medicine

## 2010-12-23 MED ORDER — METOPROLOL SUCCINATE ER 100 MG PO TB24: 100.0000 mg | ORAL_TABLET | Freq: Every day | ORAL | Status: DC

## 2010-12-28 ENCOUNTER — Other Ambulatory Visit: Payer: Self-pay | Admitting: Family Medicine

## 2010-12-28 MED ORDER — LIDOCAINE 5 % EX PTCH: 1.0000 | MEDICATED_PATCH | Freq: Two times a day (BID) | CUTANEOUS | Status: DC

## 2011-01-04 ENCOUNTER — Ambulatory Visit (INDEPENDENT_AMBULATORY_CARE_PROVIDER_SITE_OTHER): Payer: Self-pay | Admitting: Family Medicine

## 2011-01-04 ENCOUNTER — Encounter: Payer: Self-pay | Admitting: Family Medicine

## 2011-01-04 ENCOUNTER — Other Ambulatory Visit: Payer: Self-pay | Admitting: Family Medicine

## 2011-01-04 VITALS — BP 130/80 | HR 82 | Temp 98.6°F | Wt 182.0 lb

## 2011-01-04 DIAGNOSIS — K047 Periapical abscess without sinus: Secondary | ICD-10-CM | POA: Insufficient documentation

## 2011-01-04 DIAGNOSIS — R569 Unspecified convulsions: Secondary | ICD-10-CM

## 2011-01-04 MED ORDER — HYDROCODONE-ACETAMINOPHEN 5-500 MG PO TABS: 1.0000 | ORAL_TABLET | Freq: Four times a day (QID) | ORAL | Status: DC | PRN

## 2011-01-04 MED ORDER — CARBAMAZEPINE ER 200 MG PO TB12: 200.0000 mg | ORAL_TABLET | Freq: Two times a day (BID) | ORAL | Status: DC

## 2011-01-04 MED ORDER — PENICILLIN V POTASSIUM 500 MG PO TABS: 500.0000 mg | ORAL_TABLET | Freq: Four times a day (QID) | ORAL | Status: AC

## 2011-01-05 ENCOUNTER — Telehealth: Payer: Self-pay | Admitting: Family Medicine

## 2011-01-05 NOTE — Telephone Encounter (Signed)
Pt called taking hydrocodone and antibiotic for her dental abscess seen yesterday by Dr. Ashley Royalty.  Pt states the pain is still bad and the hydrocodone is only working for four hours at a time.  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 Pt agreed with plan

## 2011-01-11 NOTE — Assessment & Plan Note (Signed)
Exam consistent with dental abscess will give rx for penicillin and vicodin for pain control.  Will give referral to dental clinic for urgent extraction as this is only curative measure.  Given red flags on when to return or go to ED

## 2011-01-11 NOTE — Progress Notes (Signed)
  Subjective:    Patient ID: Kristin [name], female    DOB: March 26, 1956, 55 y.o.   MRN: [id number]  HPI Patient here with complaint of dental pain.  Pain Has been ongoing since this past Saturday.  Location is top two teeth on R side.  Feels like her cheek and gums have been swollen as well.  Has not noticed any bleeding from her gums or has not noticed any drainage.  This feels very similar to previous abscesses she has had.  She denies fever, chills, nausea, chest pain, headaches.  She does have an appt on 8/28 with [facility] but feels like she need to get in sooner   Review of Systems    See HPI Objective:   Physical Exam  HENT:  Mouth/Throat: Uvula is midline and oropharynx is clear and moist. Abnormal dentition. Dental abscesses and dental caries present.       Swelling along gumline and pain with palpation of tooth on upper right molars  Cardiovascular: Normal rate, regular rhythm and normal heart sounds.   Pulmonary/Chest: Effort normal and breath sounds normal. No respiratory distress.          Assessment & Plan:

## 2011-01-14 ENCOUNTER — Other Ambulatory Visit: Payer: Self-pay | Admitting: Family Medicine

## 2011-01-14 MED ORDER — PHENYTOIN SODIUM EXTENDED 100 MG PO CAPS: 300.0000 mg | ORAL_CAPSULE | Freq: Every day | ORAL | Status: DC

## 2011-01-24 ENCOUNTER — Ambulatory Visit (INDEPENDENT_AMBULATORY_CARE_PROVIDER_SITE_OTHER): Payer: Self-pay | Admitting: Family Medicine

## 2011-01-24 ENCOUNTER — Encounter: Payer: Self-pay | Admitting: Family Medicine

## 2011-01-24 VITALS — BP 126/84 | HR 91 | Ht 62.0 in | Wt 214.0 lb

## 2011-01-24 DIAGNOSIS — E669 Obesity, unspecified: Secondary | ICD-10-CM

## 2011-01-24 DIAGNOSIS — K047 Periapical abscess without sinus: Secondary | ICD-10-CM

## 2011-01-24 DIAGNOSIS — I10 Essential (primary) hypertension: Secondary | ICD-10-CM

## 2011-01-24 DIAGNOSIS — K5900 Constipation, unspecified: Secondary | ICD-10-CM

## 2011-01-24 DIAGNOSIS — M5126 Other intervertebral disc displacement, lumbar region: Secondary | ICD-10-CM

## 2011-01-24 MED ORDER — AMLODIPINE BESYLATE 10 MG PO TABS: 10.0000 mg | ORAL_TABLET | Freq: Every day | ORAL | Status: DC

## 2011-01-24 MED ORDER — POLYETHYLENE GLYCOL 3350 17 GM/SCOOP PO POWD: 17.0000 g | Freq: Every day | ORAL | Status: AC

## 2011-01-24 MED ORDER — LIDOCAINE 5 % EX PTCH: 1.0000 | MEDICATED_PATCH | Freq: Two times a day (BID) | CUTANEOUS | Status: DC

## 2011-01-24 MED ORDER — CYCLOBENZAPRINE HCL 10 MG PO TABS: 10.0000 mg | ORAL_TABLET | Freq: Three times a day (TID) | ORAL | Status: DC | PRN

## 2011-01-24 MED ORDER — METOPROLOL SUCCINATE ER 100 MG PO TB24: 100.0000 mg | ORAL_TABLET | Freq: Every day | ORAL | Status: DC

## 2011-01-24 MED ORDER — LIDOCAINE 5 % EX PTCH: 1.0000 | MEDICATED_PATCH | Freq: Two times a day (BID) | CUTANEOUS | Status: AC

## 2011-01-24 MED ORDER — PREGABALIN 100 MG PO CAPS: 100.0000 mg | ORAL_CAPSULE | Freq: Two times a day (BID) | ORAL | Status: DC

## 2011-01-24 MED ORDER — HYDROCHLOROTHIAZIDE 25 MG PO TABS: 25.0000 mg | ORAL_TABLET | Freq: Every day | ORAL | Status: DC

## 2011-01-24 NOTE — Patient Instructions (Signed)
It was nice to meet you.  I am sorry you have been hurting so much lately.  I want you to take Tylenol, two extra strength (500 mg) tablets by mouth three times a day with breakfast, lunch and dinner.  Remember the max dose for Tylenol is 4g a day.    I also want you to start taking miralax for your constipation.  You can start with on capful mixed into 8 oz of fluid a day, and increase or decrease so that you have one soft bowel movement daily or every other day.  The office will contact you about a referral to Physical Therapy.

## 2011-01-25 DIAGNOSIS — K5900 Constipation, unspecified: Secondary | ICD-10-CM | POA: Insufficient documentation

## 2011-01-25 NOTE — Assessment & Plan Note (Signed)
Improved, still advise pt to see a dentist when able.

## 2011-01-25 NOTE — Assessment & Plan Note (Addendum)
Seems to be a chronic problem, Rx Miralax.
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
## 2011-01-25 NOTE — Assessment & Plan Note (Signed)
Contributing to back pain.  Advised patient of this, she can identify some problems with her lifestyle (does not eat regular meals then binges, inactivity.)  Encouraged her to eat three meals a day with snacks in between.  Again PT may help patient with activity level.

## 2011-01-25 NOTE — Assessment & Plan Note (Signed)
Pt with back pain, no red flags.  This is a chronic problem.  Advised scheduled tylenol, Rx flexeril.  Pt did not tolerate gabapentin, will Rx lyrica and see if pt can get it through the prescription assistance program.  Rx Lidocaine patches.  Will refer back to Physical therapy.  Avoid Narcotics.

## 2011-01-25 NOTE — Assessment & Plan Note (Signed)
" >>  ASSESSMENT AND PLAN FOR HYPERTENSION, BENIGN SYSTEMIC WRITTEN ON 01/25/2011 11:34 AM BY Aylin Rhoads, MD  Well controlled, refill current medications,.  "

## 2011-01-25 NOTE — Assessment & Plan Note (Signed)
Well controlled, refill current medications,.

## 2011-02-23 ENCOUNTER — Other Ambulatory Visit: Payer: Self-pay | Admitting: Family Medicine

## 2011-02-23 MED ORDER — ROSUVASTATIN CALCIUM 20 MG PO TABS: 20.0000 mg | ORAL_TABLET | Freq: Every day | ORAL | Status: DC

## 2011-03-04 ENCOUNTER — Other Ambulatory Visit (HOSPITAL_COMMUNITY): Payer: Self-pay | Admitting: Emergency Medicine

## 2011-03-04 ENCOUNTER — Other Ambulatory Visit: Payer: Self-pay | Admitting: Emergency Medicine

## 2011-03-04 DIAGNOSIS — Z1231 Encounter for screening mammogram for malignant neoplasm of breast: Secondary | ICD-10-CM

## 2011-03-16 ENCOUNTER — Other Ambulatory Visit: Payer: Self-pay | Admitting: Family Medicine

## 2011-03-16 DIAGNOSIS — I10 Essential (primary) hypertension: Secondary | ICD-10-CM

## 2011-03-16 MED ORDER — METOPROLOL SUCCINATE ER 100 MG PO TB24: 100.0000 mg | ORAL_TABLET | Freq: Every day | ORAL | Status: DC

## 2011-03-24 ENCOUNTER — Ambulatory Visit
Admission: RE | Admit: 2011-03-24 | Discharge: 2011-03-24 | Disposition: A | Discharge status: Home / Self Care | Payer: PRIVATE HEALTH INSURANCE | Source: Ambulatory Visit | Attending: Emergency Medicine | Admitting: Emergency Medicine

## 2011-03-24 DIAGNOSIS — Z1231 Encounter for screening mammogram for malignant neoplasm of breast: Secondary | ICD-10-CM

## 2011-03-28 LAB — POCT CARDIAC MARKERS
CKMB, poc: 1.2
Myoglobin, poc: 56.4
Operator id: 133351
Operator id: 133351
Troponin i, poc: <0.05
Troponin i, poc: <0.05

## 2011-03-28 LAB — I-STAT 8, (EC8 V) (CONVERTED LAB)
Acid-Base Excess: 5 — ABNORMAL HIGH
Bicarbonate: 30.3 — ABNORMAL HIGH
Glucose, Bld: 158 — ABNORMAL HIGH
TCO2: 32
pH, Ven: 7.443 — ABNORMAL HIGH

## 2011-03-28 LAB — POCT I-STAT CREATININE
Creatinine, Ser: 1.4 — ABNORMAL HIGH
Operator id: 133351

## 2011-03-31 ENCOUNTER — Ambulatory Visit: Payer: PRIVATE HEALTH INSURANCE | Admitting: Family Medicine

## 2011-03-31 ENCOUNTER — Other Ambulatory Visit: Payer: Self-pay | Admitting: Family Medicine

## 2011-03-31 MED ORDER — VENLAFAXINE HCL 75 MG PO TABS: 75.0000 mg | ORAL_TABLET | Freq: Three times a day (TID) | ORAL | Status: DC

## 2011-04-21 ENCOUNTER — Other Ambulatory Visit: Payer: Self-pay | Admitting: Family Medicine

## 2011-04-21 MED ORDER — PHENYTOIN SODIUM EXTENDED 100 MG PO CAPS: 300.0000 mg | ORAL_CAPSULE | Freq: Every day | ORAL | Status: DC

## 2011-05-11 ENCOUNTER — Ambulatory Visit (INDEPENDENT_AMBULATORY_CARE_PROVIDER_SITE_OTHER): Payer: PRIVATE HEALTH INSURANCE | Admitting: Family Medicine

## 2011-05-11 ENCOUNTER — Encounter: Payer: Self-pay | Admitting: Family Medicine

## 2011-05-11 DIAGNOSIS — I10 Essential (primary) hypertension: Secondary | ICD-10-CM

## 2011-05-11 DIAGNOSIS — M6740 Ganglion, unspecified site: Secondary | ICD-10-CM

## 2011-05-11 DIAGNOSIS — R569 Unspecified convulsions: Secondary | ICD-10-CM

## 2011-05-11 DIAGNOSIS — M67439 Ganglion, unspecified wrist: Secondary | ICD-10-CM

## 2011-05-11 DIAGNOSIS — N269 Renal sclerosis, unspecified: Secondary | ICD-10-CM

## 2011-05-11 LAB — BASIC METABOLIC PANEL
BUN: 10 mg/dL (ref 6–23)
Calcium: 9.1 mg/dL (ref 8.4–10.5)
Potassium: 4.2 mEq/L (ref 3.5–5.3)
Sodium: 140 mEq/L (ref 135–145)

## 2011-05-11 MED ORDER — GABAPENTIN 300 MG PO CAPS: 300.0000 mg | ORAL_CAPSULE | Freq: Every day | ORAL | Status: DC

## 2011-05-11 NOTE — Assessment & Plan Note (Signed)
Will refer pt to Ortho as she has had cyst drained and injected, and she would like definitive management. Will Rx low dose gabapentin for the nerve pain, and continue tylenol too. Advised to wear wrist splint.

## 2011-05-11 NOTE — Patient Instructions (Signed)
It was good to see you, I am sorry your cyst is back and is hurting you so badly.  I would like you to see the Orthopaedic Surgeon again to see about having it removed.  I am going to make a referral for you to see them.  Our office should contact you about an appointment, but if you do not hear from our office within one week, please call and ask about it.   Try taking gabapentin at bedtime to help with those shooting pains.  Continue the tylenol and the wrist splint for the pain.

## 2011-05-11 NOTE — Progress Notes (Signed)
  Subjective:    Patient ID: Kristin [name], female    DOB: [birth year], 55 y.o.   MRN: [id number]  HPI  Ms. Snowberger presents for follow up and due to her worsening ganglion cyst in her left wrist.  She had the cyst drained and injected with a steroid back in March.  She says that it helped for a while, but the cyst has come back and gotten much larger.   She says it hurts to touch, and she has been taking tylenol for the pain.  She also says that she has started to having shooting pains up her wrist.  She also complains that sometimes she has a lot of pain with moving her fingers.    Seizure- pt has not had any seizures in many months.  She is taking her tegretol and dilantin without difficulty. She has not had her levels checked in more than 6 months.   HTN- Taking all 3 blood pressure medications.  Denies chest pain, palpitations, dizziness, dyspnea.    Renal Sclerosis- patient states she has only one good kidney, she knows she is not supposed to take NSAIDS.  She has not had her kidney function checked in many months.    Review of Systems Pertinent items stated in HPI.     Objective:   Physical Exam BP 133/86  Pulse 99  Ht 5\' 2"  (1.575 m)  Wt 211 lb (95.709 kg)  BMI 38.59 kg/m2 General appearance: alert, cooperative and no distress Eyes: conjunctivae/corneas clear. PERRL, EOM's intact. Fundi benign. Neck: no adenopathy, no JVD, supple, symmetrical, trachea midline and thyroid not enlarged, symmetric, no tenderness/mass/nodules Lungs: clear to auscultation bilaterally Heart: regular rate and rhythm, S1, S2 normal, no murmur, click, rub or gallop Extremities: extremities normal, atraumatic, no cyanosis or edema and Left dorsal wrist with cyst slightly medial to the midline. Tender to palpation.  Cyst measures 1x1.5 cm.  No erythema or induration.  Pt with normal strength of flexor and extensor motion, but pain with strength testing.  Pulses: 2+ and symmetric Skin: Skin color, texture,  turgor normal. No rashes or lesions Neurologic: Grossly normal       Assessment & Plan:

## 2011-05-11 NOTE — Assessment & Plan Note (Signed)
Well controlled.  Will check levels for tegratol and dilantin today.

## 2011-05-11 NOTE — Assessment & Plan Note (Signed)
Will continue to monitor kidney function due to this process.

## 2011-05-11 NOTE — Assessment & Plan Note (Signed)
Well controlled.  Will continue current medications, check BMET to monitor electrolytes and kidney function.

## 2011-05-11 NOTE — Assessment & Plan Note (Signed)
" >>  ASSESSMENT AND PLAN FOR HYPERTENSION, BENIGN SYSTEMIC WRITTEN ON 05/11/2011 11:29 AM BY Jammie Troup, MD  Well controlled.  Will continue current medications, check BMET to monitor electrolytes and kidney function.  "

## 2011-05-12 ENCOUNTER — Encounter: Payer: Self-pay | Admitting: Family Medicine

## 2011-05-12 LAB — CARBAMAZEPINE LEVEL, TOTAL: Carbamazepine Lvl: 6.5 ug/mL (ref 4.0–12.0)

## 2011-05-25 ENCOUNTER — Encounter: Payer: Self-pay | Admitting: Gastroenterology

## 2011-05-30 ENCOUNTER — Telehealth: Payer: Self-pay | Admitting: Family Medicine

## 2011-05-30 NOTE — Telephone Encounter (Signed)
Kristin [name] is calling about the referral for her wrist.  She mentioned that she was told there wasn't a specialist that she could use through [facility], but she is in a lot of pain and needs to know what to do.

## 2011-05-31 NOTE — Telephone Encounter (Signed)
No specialists available through [facility] at this time, will forward to MD.

## 2011-06-06 NOTE — Telephone Encounter (Signed)
Called Kristin [name], let her know that we cannot get her into see the Ortho docs with the orange card right now.   Advised if the cyst is still bothering her a lot we can drain it in the office, which will not be a permanent fix but likely give several months of relief.  She understands and agrees to call the front office for an appointment.

## 2011-06-08 ENCOUNTER — Ambulatory Visit (INDEPENDENT_AMBULATORY_CARE_PROVIDER_SITE_OTHER): Payer: Self-pay | Admitting: Family Medicine

## 2011-06-08 ENCOUNTER — Encounter: Payer: Self-pay | Admitting: Family Medicine

## 2011-06-08 VITALS — BP 110/74 | HR 85 | Temp 98.2°F | Ht 62.0 in | Wt 210.6 lb

## 2011-06-08 DIAGNOSIS — M67439 Ganglion, unspecified wrist: Secondary | ICD-10-CM

## 2011-06-08 DIAGNOSIS — Z23 Encounter for immunization: Secondary | ICD-10-CM

## 2011-06-08 DIAGNOSIS — M6740 Ganglion, unspecified site: Secondary | ICD-10-CM

## 2011-06-08 NOTE — Progress Notes (Signed)
Patient ID: Kristin [name], female   DOB: June 18, 1956, 55 y.o.   MRN: [id number] Ganlion Cyst Drainage Procedure Note  Pre-operative Diagnosis: Ganglion cyst of left wrist, 2 cm x 1.5 cm  Post-operative Diagnosis: Same, s/p drainage  Indications: Pain relief  Anesthesia: Lidocaine 1% without epinephrine without added sodium bicarbonate  Procedure Details  History of allergy to iodine: no  Patient informed of the risks (including bleeding and infection) and benefits of the  procedure and Written informed consent obtained.  The lesion and surrounding area was given a sterile prep using betadyne and draped in the usual sterile fashion. A 25 gauge needle was used to drain the cyst, about 1 cc of yellow fluid was drained from the cyst.  A bandage was placed over the needle site.   EBL: Minimal  Condition: Stable  Complications: none.  Plan: 1. Instructed to keep the needle site dry and covered for 24 and clean thereafter. 2. Warning signs of infection were reviewed.   3. Recommended that the patient use OTC acetaminophen as needed for pain.

## 2011-06-08 NOTE — Patient Instructions (Signed)
It was nice to see you, I'm sorry the cyst in your wrist continues to bother you so much.  I hope that draining it today will help for several months.  Please keep the band aid on until tomorrow, and if at any time you have redness, white drainage, increased bleeding, or pain, please seek medical care.

## 2011-06-22 ENCOUNTER — Other Ambulatory Visit: Payer: Self-pay | Admitting: Family Medicine

## 2011-06-22 MED ORDER — ROSUVASTATIN CALCIUM 20 MG PO TABS: 20.0000 mg | ORAL_TABLET | Freq: Every day | ORAL | Status: DC

## 2011-07-19 ENCOUNTER — Other Ambulatory Visit: Payer: Self-pay | Admitting: Gastroenterology

## 2011-08-26 ENCOUNTER — Ambulatory Visit: Payer: Self-pay | Admitting: Family Medicine

## 2011-09-28 ENCOUNTER — Other Ambulatory Visit: Payer: Self-pay | Admitting: Family Medicine

## 2011-09-28 MED ORDER — VENLAFAXINE HCL 75 MG PO TABS: 75.0000 mg | ORAL_TABLET | Freq: Three times a day (TID) | ORAL | Status: DC

## 2011-10-03 ENCOUNTER — Other Ambulatory Visit: Payer: Self-pay | Admitting: Family Medicine

## 2011-10-03 DIAGNOSIS — I10 Essential (primary) hypertension: Secondary | ICD-10-CM

## 2011-10-03 MED ORDER — METOPROLOL SUCCINATE ER 100 MG PO TB24: 100.0000 mg | ORAL_TABLET | Freq: Every day | ORAL | Status: DC

## 2011-10-23 ENCOUNTER — Emergency Department (INDEPENDENT_AMBULATORY_CARE_PROVIDER_SITE_OTHER)
Admission: EM | Admit: 2011-10-23 | Discharge: 2011-10-23 | Disposition: A | Discharge status: Home / Self Care | Payer: Self-pay | Source: Home / Self Care | Attending: Family Medicine | Admitting: Family Medicine

## 2011-10-23 ENCOUNTER — Emergency Department (INDEPENDENT_AMBULATORY_CARE_PROVIDER_SITE_OTHER): Payer: Self-pay

## 2011-10-23 ENCOUNTER — Encounter (HOSPITAL_COMMUNITY): Payer: Self-pay

## 2011-10-23 DIAGNOSIS — J410 Simple chronic bronchitis: Secondary | ICD-10-CM

## 2011-10-23 DIAGNOSIS — J309 Allergic rhinitis, unspecified: Secondary | ICD-10-CM

## 2011-10-23 DIAGNOSIS — J302 Other seasonal allergic rhinitis: Secondary | ICD-10-CM

## 2011-10-23 DIAGNOSIS — J40 Bronchitis, not specified as acute or chronic: Secondary | ICD-10-CM

## 2011-10-23 MED ORDER — CEFUROXIME AXETIL 250 MG PO TABS: 250.0000 mg | ORAL_TABLET | Freq: Two times a day (BID) | ORAL | Status: AC

## 2011-10-23 MED ORDER — FLUTICASONE PROPIONATE 50 MCG/ACT NA SUSP: 2.0000 | Freq: Every day | NASAL | Status: DC

## 2011-10-23 NOTE — Discharge Instructions (Signed)
Take all of medicine, drink lots of fluids, no more smoking, see your doctor if further problems  °

## 2011-10-23 NOTE — ED Provider Notes (Signed)
History     CSN: [id number]  Arrival date & time 10/23/11  1711   First MD Initiated Contact with Patient 10/23/11 1715      Chief Complaint  Patient presents with  . URI    (Consider location/radiation/quality/duration/timing/severity/associated sxs/prior treatment) Patient is a 56 y.o. female presenting with URI. The history is provided by the patient.  URI The primary symptoms include cough. Primary symptoms do not include fever, sore throat, wheezing, nausea or vomiting. The current episode started more than 1 week ago. This is a new problem. The problem has not changed since onset. Associated with: smoking  Symptoms associated with the illness include congestion and rhinorrhea.    Past Medical History  Diagnosis Date  . Renal sclerosis, unspecified   . Back pain, chronic   . GERD (gastroesophageal reflux disease)   . Obesity   . Tobacco abuse   . Hypertension   . Hyperlipidemia   . Seizures   . Depression   . Somatization disorder     Past Surgical History  Procedure Date  . Tubal ligation   . Abdominal hysterectomy   . Cholecystectomy     History reviewed. No pertinent family history.  History  Substance Use Topics  . Smoking status: Current Everyday Smoker -- .5 years  . Smokeless tobacco: Not on file  . Alcohol Use: Not on file    OB History    Grav Para Term Preterm Abortions TAB SAB Ect Mult Living                  Review of Systems  Constitutional: Negative.  Negative for fever.  HENT: Positive for congestion and rhinorrhea. Negative for nosebleeds and sore throat.   Respiratory: Positive for cough. Negative for chest tightness and wheezing.   Cardiovascular: Negative.   Gastrointestinal: Negative.  Negative for nausea and vomiting.    Allergies  Fluconazole  Home Medications   Current Outpatient Rx  Name Route Sig Dispense Refill  . AMLODIPINE BESYLATE 10 MG PO TABS Oral Take 1 tablet (10 mg total) by mouth daily. 90 tablet 3  .  CARBAMAZEPINE ER 200 MG PO TB12 Oral Take 1 tablet (200 mg total) by mouth 2 (two) times daily. Take one tab in am & 2 tabs in the evening 100 tablet 3  . CEFUROXIME AXETIL 250 MG PO TABS Oral Take 1 tablet (250 mg total) by mouth 2 (two) times daily. 20 tablet 0  . CYCLOBENZAPRINE HCL 10 MG PO TABS Oral Take 1 tablet (10 mg total) by mouth 3 (three) times daily as needed. As needed for back pain 30 tablet 2  . FLUTICASONE PROPIONATE 50 MCG/ACT NA SUSP Nasal Place 2 sprays into the nose daily. 1 g 2  . GABAPENTIN 300 MG PO CAPS Oral Take 1 capsule (300 mg total) by mouth at bedtime. 30 capsule 2  . HYDROCHLOROTHIAZIDE 25 MG PO TABS Oral Take 1 tablet (25 mg total) by mouth daily. 30 tablet 6  . LIDOCAINE 5 % EX PTCH Transdermal Place 1 patch onto the skin every 12 (twelve) hours. Remove & Discard patch within 12 hours or as directed by MD 10 patch 2  . METOPROLOL SUCCINATE ER 100 MG PO TB24 Oral Take 1 tablet (100 mg total) by mouth daily. 100 tablet 3  . PANTOPRAZOLE SODIUM 40 MG PO TBEC Oral Take 40 mg by mouth daily.      Marland Kitchen PHENYTOIN SODIUM EXTENDED 100 MG PO CAPS Oral Take 3 capsules (300 mg  total) by mouth daily. 90 capsule 5  . ROSUVASTATIN CALCIUM 20 MG PO TABS Oral Take 1 tablet (20 mg total) by mouth daily. 90 tablet 2  . VENLAFAXINE HCL 75 MG PO TABS Oral Take 1 tablet (75 mg total) by mouth 3 (three) times daily. 90 tablet 5    BP 146/93  Pulse 106  Temp(Src) 99.3 F (37.4 C) (Oral)  Resp 19  SpO2 97%  Physical Exam  Nursing note and vitals reviewed. Constitutional: She is oriented to person, place, and time. She appears well-developed and well-nourished.  HENT:  Head: Normocephalic.  Right Ear: External ear normal.  Left Ear: External ear normal.  Nose: Nose normal.  Mouth/Throat: Oropharynx is clear and moist.  Eyes: Conjunctivae are normal. Pupils are equal, round, and reactive to light.  Neck: Normal range of motion. Neck supple.  Cardiovascular: Normal rate, normal  heart sounds and intact distal pulses.   Pulmonary/Chest: Effort normal. She has rhonchi.  Lymphadenopathy:    She has no cervical adenopathy.  Neurological: She is alert and oriented to person, place, and time.  Skin: Skin is warm and dry.    ED Course  Procedures (including critical care time)  Labs Reviewed - No data to display Dg Chest 2 View  10/23/2011  *RADIOLOGY REPORT*  Clinical Data: Cough, hemoptysis, cold symptoms  CHEST - 2 VIEW  Comparison: 08/25/2007  Findings: Cardiomediastinal silhouette is within normal limits. The lungs are clear. No pleural effusion.  No pneumothorax.  No acute osseous abnormality. Cholecystectomy clips noted.  IMPRESSION: No acute cardiopulmonary process.  Original Report Authenticated By: Harrel Lemon, M.D.     1. Bronchitis due to tobacco use   2. Seasonal allergic rhinitis       MDM  X-rays reviewed and report per radiologist.         [name], MD 10/23/11 [phone]

## 2011-10-23 NOTE — ED Notes (Signed)
Pt has head congestion and cough for one week.

## 2011-10-28 ENCOUNTER — Other Ambulatory Visit: Payer: Self-pay | Admitting: Family Medicine

## 2011-10-28 MED ORDER — PHENYTOIN SODIUM EXTENDED 100 MG PO CAPS: 300.0000 mg | ORAL_CAPSULE | Freq: Every day | ORAL | Status: DC

## 2012-03-21 ENCOUNTER — Other Ambulatory Visit: Payer: Self-pay | Admitting: Family Medicine

## 2012-03-21 MED ORDER — VENLAFAXINE HCL 75 MG PO TABS: 75.0000 mg | ORAL_TABLET | Freq: Three times a day (TID) | ORAL | Status: DC

## 2012-04-04 ENCOUNTER — Encounter: Payer: Self-pay | Admitting: Gastroenterology

## 2012-04-12 ENCOUNTER — Other Ambulatory Visit: Payer: Self-pay | Admitting: Family Medicine

## 2012-04-12 DIAGNOSIS — I10 Essential (primary) hypertension: Secondary | ICD-10-CM

## 2012-04-12 MED ORDER — AMLODIPINE BESYLATE 10 MG PO TABS: 10.0000 mg | ORAL_TABLET | Freq: Every day | ORAL | Status: DC

## 2012-04-13 ENCOUNTER — Other Ambulatory Visit: Payer: Self-pay | Admitting: Family Medicine

## 2012-04-13 MED ORDER — PHENYTOIN SODIUM EXTENDED 100 MG PO CAPS: 300.0000 mg | ORAL_CAPSULE | Freq: Every day | ORAL | Status: DC

## 2012-05-12 ENCOUNTER — Emergency Department (HOSPITAL_COMMUNITY)
Admission: EM | Admit: 2012-05-12 | Discharge: 2012-05-12 | Disposition: A | Discharge status: Home / Self Care | Payer: Self-pay | Attending: Emergency Medicine | Admitting: Emergency Medicine

## 2012-05-12 ENCOUNTER — Encounter (HOSPITAL_COMMUNITY): Payer: Self-pay | Admitting: Physical Medicine and Rehabilitation

## 2012-05-12 DIAGNOSIS — F3289 Other specified depressive episodes: Secondary | ICD-10-CM | POA: Insufficient documentation

## 2012-05-12 DIAGNOSIS — Z79899 Other long term (current) drug therapy: Secondary | ICD-10-CM | POA: Insufficient documentation

## 2012-05-12 DIAGNOSIS — E785 Hyperlipidemia, unspecified: Secondary | ICD-10-CM | POA: Insufficient documentation

## 2012-05-12 DIAGNOSIS — E669 Obesity, unspecified: Secondary | ICD-10-CM | POA: Insufficient documentation

## 2012-05-12 DIAGNOSIS — M542 Cervicalgia: Secondary | ICD-10-CM | POA: Insufficient documentation

## 2012-05-12 DIAGNOSIS — K219 Gastro-esophageal reflux disease without esophagitis: Secondary | ICD-10-CM | POA: Insufficient documentation

## 2012-05-12 DIAGNOSIS — I10 Essential (primary) hypertension: Secondary | ICD-10-CM | POA: Insufficient documentation

## 2012-05-12 DIAGNOSIS — M25529 Pain in unspecified elbow: Secondary | ICD-10-CM | POA: Insufficient documentation

## 2012-05-12 DIAGNOSIS — F17200 Nicotine dependence, unspecified, uncomplicated: Secondary | ICD-10-CM | POA: Insufficient documentation

## 2012-05-12 DIAGNOSIS — F329 Major depressive disorder, single episode, unspecified: Secondary | ICD-10-CM | POA: Insufficient documentation

## 2012-05-12 MED ORDER — OXYCODONE-ACETAMINOPHEN 5-325 MG PO TABS
1.0000 | ORAL_TABLET | Freq: Once | ORAL | Status: AC
  Administered 2012-05-12: 1 via ORAL
  Filled 2012-05-12: qty 1

## 2012-05-12 MED ORDER — CYCLOBENZAPRINE HCL 10 MG PO TABS
10.0000 mg | ORAL_TABLET | Freq: Once | ORAL | Status: AC
  Administered 2012-05-12: 10 mg via ORAL
  Filled 2012-05-12: qty 1

## 2012-05-12 MED ORDER — CYCLOBENZAPRINE HCL 10 MG PO TABS: 10.0000 mg | ORAL_TABLET | Freq: Three times a day (TID) | ORAL | Status: DC | PRN

## 2012-05-12 MED ORDER — OXYCODONE-ACETAMINOPHEN 5-325 MG PO TABS: 1.0000 | ORAL_TABLET | ORAL | Status: DC | PRN

## 2012-05-12 MED ORDER — IBUPROFEN 800 MG PO TABS
800.0000 mg | ORAL_TABLET | Freq: Once | ORAL | Status: AC
  Administered 2012-05-12: 800 mg via ORAL
  Filled 2012-05-12: qty 1

## 2012-05-12 NOTE — ED Notes (Signed)
Pt presents to department for evaluation of L shoulder pain and headache. Ongoing for several months. Pt states pain became worse today. 10/10 pain at the time, becomes worse with movement. Ambulatory to triage. No signs of distress noted.

## 2012-05-12 NOTE — ED Provider Notes (Signed)
History     CSN: [id number]  Arrival date & time 05/12/12  1429   First MD Initiated Contact with Patient 05/12/12 1458      Chief Complaint  Patient presents with  . Headache  . Shoulder Pain    (Consider location/radiation/quality/duration/timing/severity/associated sxs/prior treatment) Patient is a 56 y.o. female presenting with headaches and shoulder pain. The history is provided by the patient.  Headache   Shoulder Pain Associated symptoms include headaches.  She has been having pain in the left side of her neck for the last several months. Pain radiates up into her head into her left shoulder. It is sometimes dull and throbbing, sometimes sharp. It is worse with any head movement. Pain started getting much worse about a week ago. She's tried taking naproxen and hydrocodone-acetaminophen with no relief. Pain is severe and she rates it at 10/10. She denies any weakness or numbness or tingling. She denies any bowel or bladder dysfunction.  Past Medical History  Diagnosis Date  . Renal sclerosis, unspecified   . Back pain, chronic   . GERD (gastroesophageal reflux disease)   . Obesity   . Tobacco abuse   . Hypertension   . Hyperlipidemia   . Seizures   . Depression   . Somatization disorder     Past Surgical History  Procedure Date  . Tubal ligation   . Abdominal hysterectomy   . Cholecystectomy     No family history on file.  History  Substance Use Topics  . Smoking status: Current Every Day Smoker -- .5 years    Types: Cigarettes  . Smokeless tobacco: Not on file  . Alcohol Use: Yes    OB History    Grav Para Term Preterm Abortions TAB SAB Ect Mult Living                  Review of Systems  Neurological: Positive for headaches.  All other systems reviewed and are negative.    Allergies  Fluconazole  Home Medications   Current Outpatient Rx  Name  Route  Sig  Dispense  Refill  . AMLODIPINE BESYLATE 10 MG PO TABS   Oral   Take 1 tablet (10  mg total) by mouth daily.   90 tablet   3   . CARBAMAZEPINE ER 200 MG PO TB12   Oral   Take 1 tablet (200 mg total) by mouth 2 (two) times daily. Take one tab in am & 2 tabs in the evening   100 tablet   3   . CYCLOBENZAPRINE HCL 10 MG PO TABS   Oral   Take 1 tablet (10 mg total) by mouth 3 (three) times daily as needed. As needed for back pain   30 tablet   2   . FLUTICASONE PROPIONATE 50 MCG/ACT NA SUSP   Nasal   Place 2 sprays into the nose daily.   1 g   2   . GABAPENTIN 300 MG PO CAPS   Oral   Take 1 capsule (300 mg total) by mouth at bedtime.   30 capsule   2   . HYDROCHLOROTHIAZIDE 25 MG PO TABS   Oral   Take 1 tablet (25 mg total) by mouth daily.   30 tablet   6   . METOPROLOL SUCCINATE ER 100 MG PO TB24   Oral   Take 1 tablet (100 mg total) by mouth daily.   100 tablet   3   . PANTOPRAZOLE SODIUM 40 MG PO  TBEC   Oral   Take 40 mg by mouth daily.           Marland Kitchen PHENYTOIN SODIUM EXTENDED 100 MG PO CAPS   Oral   Take 3 capsules (300 mg total) by mouth daily.   90 capsule   5   . ROSUVASTATIN CALCIUM 20 MG PO TABS   Oral   Take 1 tablet (20 mg total) by mouth daily.   90 tablet   2   . VENLAFAXINE HCL 75 MG PO TABS   Oral   Take 1 tablet (75 mg total) by mouth 3 (three) times daily.   90 tablet   5     BP 157/95  Pulse 108  Temp 98.9 F (37.2 C) (Oral)  Resp 20  SpO2 97%  Physical Exam  Nursing note and vitals reviewed. 56 year old female, resting comfortably and in no acute distress. Vital signs are significant for mild tachycardia with heart rate 108, and mild hypertension with blood pressure 157/95. Oxygen saturation is 97%, which is normal. Head is normocephalic and atraumatic. PERRLA, EOMI. Oropharynx is clear. Neck has moderate tenderness and moderate muscle spasm of the left paracervical muscles. There is mild tenderness over the cervical spine. There is no adenopathy or JVD. Back is nontender and there is no CVA  tenderness. Lungs are clear without rales, wheezes, or rhonchi. Chest is nontender. Heart has regular rate and rhythm without murmur. Abdomen is soft, flat, nontender without masses or hepatosplenomegaly and peristalsis is normoactive. Extremities have no cyanosis or edema, full range of motion is present. Skin is warm and dry without rash. Neurologic: Mental status is normal, cranial nerves are intact, there are no motor or sensory deficits.   ED Course  Procedures (including critical care time)   1. Neck pain       MDM  Neck pain and headache which seemed to be due 2 cervical muscle spasms. There is no evidence of any neurologic injury. She will be treated with NSAIDs, muscle relaxers, and narcotics. Prescriptions are given for Percocet and cyclobenzaprine and she is to use over-the-counter naproxen. Blood pressure is slightly elevated and she is advised to have that rechecked as an outpatient. Followup with her PCP. She was offered option of imaging but has decided to wait to see response to treatment first.        Dione Booze, MD 05/12/12 1525

## 2012-09-13 ENCOUNTER — Other Ambulatory Visit: Payer: Self-pay | Admitting: Family Medicine

## 2012-09-13 MED ORDER — PANTOPRAZOLE SODIUM 40 MG PO TBEC: 40.0000 mg | DELAYED_RELEASE_TABLET | Freq: Every day | ORAL | Status: DC

## 2012-10-03 ENCOUNTER — Other Ambulatory Visit: Payer: Self-pay | Admitting: Family Medicine

## 2012-10-03 MED ORDER — VENLAFAXINE HCL 75 MG PO TABS: 75.0000 mg | ORAL_TABLET | Freq: Three times a day (TID) | ORAL | Status: DC

## 2012-12-10 ENCOUNTER — Other Ambulatory Visit: Payer: Self-pay | Admitting: *Deleted

## 2012-12-10 NOTE — Telephone Encounter (Signed)
Requested Prescriptions   Pending Prescriptions Disp Refills  . phenytoin (DILANTIN) 100 MG ER capsule      Sig: Take 3 capsules (300 mg total) by mouth every morning.   Wyatt Haste, RN-BSN

## 2012-12-11 MED ORDER — PHENYTOIN SODIUM EXTENDED 100 MG PO CAPS: 300.0000 mg | ORAL_CAPSULE | ORAL | Status: DC

## 2012-12-14 ENCOUNTER — Emergency Department (HOSPITAL_COMMUNITY): Payer: Self-pay

## 2012-12-14 ENCOUNTER — Emergency Department (HOSPITAL_COMMUNITY)
Admission: EM | Admit: 2012-12-14 | Discharge: 2012-12-14 | Disposition: A | Discharge status: Home / Self Care | Payer: Self-pay | Attending: Emergency Medicine | Admitting: Emergency Medicine

## 2012-12-14 ENCOUNTER — Encounter (HOSPITAL_COMMUNITY): Payer: Self-pay | Admitting: *Deleted

## 2012-12-14 DIAGNOSIS — M549 Dorsalgia, unspecified: Secondary | ICD-10-CM | POA: Insufficient documentation

## 2012-12-14 DIAGNOSIS — S0510XA Contusion of eyeball and orbital tissues, unspecified eye, initial encounter: Secondary | ICD-10-CM | POA: Insufficient documentation

## 2012-12-14 DIAGNOSIS — Y929 Unspecified place or not applicable: Secondary | ICD-10-CM | POA: Insufficient documentation

## 2012-12-14 DIAGNOSIS — K219 Gastro-esophageal reflux disease without esophagitis: Secondary | ICD-10-CM | POA: Insufficient documentation

## 2012-12-14 DIAGNOSIS — F450 Somatization disorder: Secondary | ICD-10-CM | POA: Insufficient documentation

## 2012-12-14 DIAGNOSIS — G8929 Other chronic pain: Secondary | ICD-10-CM | POA: Insufficient documentation

## 2012-12-14 DIAGNOSIS — E785 Hyperlipidemia, unspecified: Secondary | ICD-10-CM | POA: Insufficient documentation

## 2012-12-14 DIAGNOSIS — F329 Major depressive disorder, single episode, unspecified: Secondary | ICD-10-CM | POA: Insufficient documentation

## 2012-12-14 DIAGNOSIS — T148XXA Other injury of unspecified body region, initial encounter: Secondary | ICD-10-CM

## 2012-12-14 DIAGNOSIS — M25561 Pain in right knee: Secondary | ICD-10-CM

## 2012-12-14 DIAGNOSIS — E669 Obesity, unspecified: Secondary | ICD-10-CM | POA: Insufficient documentation

## 2012-12-14 DIAGNOSIS — I10 Essential (primary) hypertension: Secondary | ICD-10-CM | POA: Insufficient documentation

## 2012-12-14 DIAGNOSIS — Z8669 Personal history of other diseases of the nervous system and sense organs: Secondary | ICD-10-CM | POA: Insufficient documentation

## 2012-12-14 DIAGNOSIS — F3289 Other specified depressive episodes: Secondary | ICD-10-CM | POA: Insufficient documentation

## 2012-12-14 DIAGNOSIS — Z8742 Personal history of other diseases of the female genital tract: Secondary | ICD-10-CM | POA: Insufficient documentation

## 2012-12-14 DIAGNOSIS — Z79899 Other long term (current) drug therapy: Secondary | ICD-10-CM | POA: Insufficient documentation

## 2012-12-14 DIAGNOSIS — W1809XA Striking against other object with subsequent fall, initial encounter: Secondary | ICD-10-CM | POA: Insufficient documentation

## 2012-12-14 DIAGNOSIS — Z7982 Long term (current) use of aspirin: Secondary | ICD-10-CM | POA: Insufficient documentation

## 2012-12-14 DIAGNOSIS — W19XXXA Unspecified fall, initial encounter: Secondary | ICD-10-CM

## 2012-12-14 DIAGNOSIS — F17200 Nicotine dependence, unspecified, uncomplicated: Secondary | ICD-10-CM | POA: Insufficient documentation

## 2012-12-14 DIAGNOSIS — Y9301 Activity, walking, marching and hiking: Secondary | ICD-10-CM | POA: Insufficient documentation

## 2012-12-14 MED ORDER — HYDROCODONE-ACETAMINOPHEN 5-325 MG PO TABS
2.0000 | ORAL_TABLET | Freq: Once | ORAL | Status: AC
  Administered 2012-12-14: 2 via ORAL
  Filled 2012-12-14: qty 2

## 2012-12-14 MED ORDER — HYDROCODONE-ACETAMINOPHEN 5-325 MG PO TABS: 1.0000 | ORAL_TABLET | Freq: Four times a day (QID) | ORAL | Status: DC | PRN

## 2012-12-14 NOTE — ED Notes (Signed)
Patient transported to X-ray 

## 2012-12-14 NOTE — ED Provider Notes (Signed)
History    This chart was scribed for non-physician practitioner Magnus Sinning, PA-C working with Doug Sou, MD by Toya Smothers, ED Scribe. This patient was seen in room TR11C/TR11C and the patient's care was started at 5:42 PM.  CSN: [id number]  Arrival date & time 12/14/12  1633   First MD Initiated Contact with Patient 12/14/12 1648      Chief Complaint  Patient presents with  . Fall    Patient is a 57 y.o. female presenting with fall. The history is provided by the patient. No language interpreter was used.  Fall Associated symptoms include headaches.  Fall Associated symptoms include headaches.    HPI Comments:   Kristin [name] is a 57 y.o. female with h/o renal sclerosis, chronic back pain, HTN, and seizures, who presents to the Emergency Department c/o fall 3 hours ago. Pt now complains of left sided facial and right knee pain . Pain is moderate, aching, worse with movement, and alleviated by nothing. Pt reports that she tripped and fell forward while walking, impacting the left side of her face on a cement floor. Per Pt, "I tried to catch myself, but I feel like my right knee buckled." She denies LOC, blurred vision, or dizziness.  Denies neck or back pain.  She was ambulatory after the event.  Pain has worsened acutely since onset. Pain has not been treated PTA. Pt denies diaphoresis, nausea, vomiting, diarrhea, weakness, chest pain, SOB and any other pain. Pt is a current everyday smoker, denying alcohol and illicit drug use. Surgical Hx includes abdominal hysterectomy and cholecystectomy.     Past Medical History  Diagnosis Date  . Renal sclerosis, unspecified   . Back pain, chronic   . GERD (gastroesophageal reflux disease)   . Obesity   . Tobacco abuse   . Hypertension   . Hyperlipidemia   . Seizures   . Depression   . Somatization disorder     Past Surgical History  Procedure Laterality Date  . Tubal ligation    . Abdominal hysterectomy    .  Cholecystectomy      History reviewed. No pertinent family history.  History  Substance Use Topics  . Smoking status: Current Every Day Smoker -- .5 years    Types: Cigarettes  . Smokeless tobacco: Not on file  . Alcohol Use: Yes    Review of Systems  HENT: Positive for facial swelling.   Musculoskeletal:       Right knee pain  Neurological: Positive for headaches.  All other systems reviewed and are negative.    Allergies  Fluconazole and Robitussin (alcohol free)  Home Medications   Current Outpatient Rx  Name  Route  Sig  Dispense  Refill  . acetaminophen (TYLENOL) 500 MG tablet   Oral   Take 1,000 mg by mouth every 6 (six) hours as needed for pain.         Marland Kitchen amLODipine (NORVASC) 10 MG tablet   Oral   Take 5 mg by mouth daily.         . Aspirin-Acetaminophen-Caffeine (EXCEDRIN PO)   Oral   Take 2 tablets by mouth every 8 (eight) hours as needed (for pain).         . carbamazepine (TEGRETOL XR) 200 MG 12 hr tablet   Oral   Take 600 mg by mouth at bedtime.         . cyclobenzaprine (FLEXERIL) 10 MG tablet   Oral   Take 1 tablet (10  mg total) by mouth 3 (three) times daily as needed for muscle spasms.   30 tablet   0   . metoprolol succinate (TOPROL-XL) 100 MG 24 hr tablet   Oral   Take 50 mg by mouth daily.         . pantoprazole (PROTONIX) 40 MG tablet   Oral   Take 1 tablet (40 mg total) by mouth daily.   90 tablet   3   . phenytoin (DILANTIN) 100 MG ER capsule   Oral   Take 3 capsules (300 mg total) by mouth every morning.   30 capsule   5   . rosuvastatin (CRESTOR) 20 MG tablet   Oral   Take 20 mg by mouth at bedtime.         Marland Kitchen venlafaxine (EFFEXOR) 75 MG tablet   Oral   Take 75-150 mg by mouth See admin instructions. Takes 2 tablets in the morning and 1 tablet at night.           BP 156/93  Pulse 88  Temp(Src) 98.4 F (36.9 C) (Oral)  Resp 18  SpO2 97%  Physical Exam  Nursing note and vitals  reviewed. Constitutional: She is oriented to person, place, and time. She appears well-developed and well-nourished. No distress.  HENT:  Head: Normocephalic and atraumatic.  Eyes: EOM are normal. Pupils are equal, round, and reactive to light. Right eye exhibits no discharge. Left eye exhibits no discharge.  Neck: Normal range of motion. Neck supple. No tracheal deviation present.  Cardiovascular: Normal rate, regular rhythm and normal heart sounds.   No murmur heard. Pulmonary/Chest: Effort normal and breath sounds normal. No respiratory distress.  Musculoskeletal: Normal range of motion.       Cervical back: She exhibits normal range of motion, no tenderness, no bony tenderness, no swelling, no edema and no deformity.       Thoracic back: She exhibits normal range of motion, no tenderness, no bony tenderness, no swelling, no edema and no deformity.       Lumbar back: She exhibits normal range of motion, no tenderness, no bony tenderness, no swelling, no edema and no deformity.  Normal ROM of upper and lower extremities Right Knee tender to palpation  Lymphadenopathy:    She has no cervical adenopathy.  Neurological: She is alert and oriented to person, place, and time. No cranial nerve deficit. Coordination normal.  Skin: Skin is warm and dry. She is not diaphoretic.  Small bruise on Right knee Hematoma superior to left eye  Psychiatric: She has a normal mood and affect. Her behavior is normal. Thought content normal.    ED Course  Procedures  DIAGNOSTIC STUDIES: Oxygen Saturation is 97% on room air, normal by my interpretation.    COORDINATION OF CARE: 17:43- Evaluated Pt. Pt is awake, alert, and without distress. 17:45- Patient understands and agrees with initial ED impression and plan with expectations set for ED visit 17:47- Ordered CT Maxillofacial WO CM 1 time imaging and DG Knee Complete 4 Views Right 1 time imaging.    Labs Reviewed - No data to display Dg Knee  Complete 4 Views Right  12/14/2012   *RADIOLOGY REPORT*  Clinical Data: Right anterior knee pain.  Fell today.  RIGHT KNEE - COMPLETE 4+ VIEW  Comparison: Report dated 11/15/2002.  Findings: Spur formation involving all three joint compartments. Mild to moderate medial joint space narrowing.  No fracture, dislocation or effusion.  IMPRESSION:  1.  No fracture or effusion. 2.  Tricompartmental degenerative changes.   Original Report Authenticated By: Beckie Salts, M.D.     No diagnosis found.    MDM  Patient presenting with a supraorbital hematoma and right knee pain after a mechanical fall earlier today.  She is not on any blood thinning medications.  No LOC.  Normal neurological exam.  Full ROM of extremities.  CT maxillofacial negative.  Knee xray negative.  Patient ambulatory.  Feel that the patient is stable for discharge.  I personally performed the services described in this documentation, which was scribed in my presence. The recorded information has been reviewed and is accurate.    Pascal Lux Lamont, PA-C 12/15/12 [phone]

## 2012-12-14 NOTE — ED Notes (Signed)
Pt reports slipping and falling at the store, hit left side of head on cement, denies any loc. Only complaint is head pain, swelling noted above left eyebrow.

## 2012-12-15 NOTE — ED Provider Notes (Signed)
Medical screening examination/treatment/procedure(s) were performed by non-physician practitioner and as supervising physician I was immediately available for consultation/collaboration.  Tamanna Whitson, MD 12/15/12 1451 

## 2012-12-19 ENCOUNTER — Other Ambulatory Visit: Payer: Self-pay | Admitting: Family Medicine

## 2012-12-19 ENCOUNTER — Telehealth: Payer: Self-pay | Admitting: *Deleted

## 2012-12-19 MED ORDER — CARBAMAZEPINE ER 200 MG PO TB12: ORAL_TABLET | ORAL | Status: DC

## 2012-12-19 NOTE — Telephone Encounter (Signed)
Pt notified needs tegretol level.  She will call back when she knows what day she can come in.  Kristin [name], Darlyne Russian, CMA

## 2012-12-21 ENCOUNTER — Ambulatory Visit (INDEPENDENT_AMBULATORY_CARE_PROVIDER_SITE_OTHER): Payer: Self-pay | Admitting: Family Medicine

## 2012-12-21 VITALS — BP 134/72 | HR 91 | Temp 99.2°F | Ht 62.0 in | Wt 218.0 lb

## 2012-12-21 DIAGNOSIS — R51 Headache: Secondary | ICD-10-CM

## 2012-12-21 DIAGNOSIS — Z905 Acquired absence of kidney: Secondary | ICD-10-CM

## 2012-12-21 DIAGNOSIS — Z5189 Encounter for other specified aftercare: Secondary | ICD-10-CM

## 2012-12-21 DIAGNOSIS — R569 Unspecified convulsions: Secondary | ICD-10-CM

## 2012-12-21 DIAGNOSIS — S0993XD Unspecified injury of face, subsequent encounter: Secondary | ICD-10-CM

## 2012-12-21 LAB — BASIC METABOLIC PANEL
Calcium: 9.4 mg/dL (ref 8.4–10.5)
Glucose, Bld: 95 mg/dL (ref 70–99)
Potassium: 4.2 mEq/L (ref 3.5–5.3)
Sodium: 139 mEq/L (ref 135–145)

## 2012-12-21 MED ORDER — KETOROLAC TROMETHAMINE 60 MG/2ML IM SOLN
60.0000 mg | Freq: Once | INTRAMUSCULAR | Status: AC
  Administered 2012-12-21: 60 mg via INTRAMUSCULAR

## 2012-12-21 MED ORDER — CYCLOBENZAPRINE HCL 10 MG PO TABS: 10.0000 mg | ORAL_TABLET | Freq: Two times a day (BID) | ORAL | Status: DC | PRN

## 2012-12-21 NOTE — Progress Notes (Signed)
Subjective:    Kristin [name] is a 57 y.o. female who presents to [facility] today for FU from ED:  1.  Fall:  Fell last Friday, was walking into store.  States "left knee gave out."  Struck head.  Seen in ED. Had negative CT scan at that time.  Still with black eye on Left side with "knot" above eyebrow.  This is improving.  Has daily headaches, but these are chronic, see below.  No loss of consciousness at that time. She is no loss of consciousness nor any further falls since last one. She does have history of seizures and is not her Tegretol checked recently. She had a witnessed fall and no seizure activity.  2.  Headaches:  Takes Tylenol, BC powders, Excedrin, Naproxen -- at least one of these on a daily basis. She has had no days in the past several months where she is not taking any medications.Marland Kitchen  HA's present roughly past 6 months or so, daily.  Now on Norco after fall.  Feels that all of these medications last only about 4 hours and then the pain returns.  Sometimes wakes her from sleep.  Diagnosed with tension headaches in the past. She describes it as "pulling and tightness" and points to left occipital region. She's also been diagnosed with cervical strain. She has difficulty turning her head to the left. No numbness or tingling in extremities. No weakness. No changes in vision. No nausea or vomiting when she has her headaches.   The following portions of the patient's history were reviewed and updated as appropriate: allergies, current medications, past medical history, family and social history, and problem list. Patient is a nonsmoker.    PMH reviewed.  Past Medical History  Diagnosis Date  . Renal sclerosis, unspecified   . Back pain, chronic   . GERD (gastroesophageal reflux disease)   . Obesity   . Tobacco abuse   . Hypertension   . Hyperlipidemia   . Seizures   . Depression   . Somatization disorder    Past Surgical History  Procedure Laterality Date  . Tubal ligation    .  Abdominal hysterectomy    . Cholecystectomy      Medications reviewed. Current Outpatient Prescriptions  Medication Sig Dispense Refill  . acetaminophen (TYLENOL) 500 MG tablet Take 1,000 mg by mouth every 6 (six) hours as needed for pain.      Marland Kitchen amLODipine (NORVASC) 10 MG tablet Take 5 mg by mouth daily.      . Aspirin-Acetaminophen-Caffeine (EXCEDRIN PO) Take 2 tablets by mouth every 8 (eight) hours as needed (for pain).      . carbamazepine (TEGRETOL XR) 200 MG 12 hr tablet Take one tab PO in am, 2 tabs PO qhs.  100 tablet  0  . cyclobenzaprine (FLEXERIL) 10 MG tablet Take 1 tablet (10 mg total) by mouth 3 (three) times daily as needed for muscle spasms.  30 tablet  0  . HYDROcodone-acetaminophen (NORCO/VICODIN) 5-325 MG per tablet Take 1-2 tablets by mouth every 6 (six) hours as needed for pain.  15 tablet  0  . metoprolol succinate (TOPROL-XL) 100 MG 24 hr tablet Take 50 mg by mouth daily.      . pantoprazole (PROTONIX) 40 MG tablet Take 1 tablet (40 mg total) by mouth daily.  90 tablet  3  . phenytoin (DILANTIN) 100 MG ER capsule Take 3 capsules (300 mg total) by mouth every morning.  30 capsule  5  . rosuvastatin (  CRESTOR) 20 MG tablet Take 20 mg by mouth at bedtime.      Marland Kitchen venlafaxine (EFFEXOR) 75 MG tablet Take 75-150 mg by mouth See admin instructions. Takes 2 tablets in the morning and 1 tablet at night.       No current facility-administered medications for this visit.    ROS as above otherwise neg.  No chest pain, palpitations, SOB, Fever, Chills, Abd pain, N/V/D.   Objective:   Physical Exam BP 134/72  Pulse 91  Temp(Src) 99.2 F (37.3 C) (Oral)  Ht 5\' 2"  (1.575 m)  Wt 218 lb (98.884 kg)  BMI 39.86 kg/m2 Gen:  Alert, cooperative patient who appears stated age in no acute distress.  Vital signs reviewed. HEENT: EOMI,  MMM. Funduscopy within normal limits bilaterally. She does have 2 cm hematoma over left eyebrow, lateral aspect. She also has a fading ecchymosis noted  under left eye. Neck: Lateral movement of neck limited to about 45. Limited by pain. She can fully move ahead to right lateral gaze. Flexion and extension at neck are full but do cause pain left side of neck. Spurling's negative. Cardiac:  Regular rate and rhythm without murmur auscultated.  Good S1/S2. Pulm:  Clear to auscultation bilaterally with good air movement.  No wheezes or rales noted.   Neuro: Cranial nerves 2-12 intact. Otherwise no focal deficits noted.  No results found for this or any previous visit (from the past 72 hour(s)).

## 2012-12-21 NOTE — Patient Instructions (Signed)
The best thing for your head is to see if you can have about a week without any pain medications.    I think you've hit a bad cycle where you get rebound headaches from the medicine.  You can take Flexeril to help with your neck.   Bloodwork today.

## 2012-12-22 DIAGNOSIS — S0993XA Unspecified injury of face, initial encounter: Secondary | ICD-10-CM | POA: Insufficient documentation

## 2012-12-22 DIAGNOSIS — G44209 Tension-type headache, unspecified, not intractable: Secondary | ICD-10-CM | POA: Insufficient documentation

## 2012-12-22 NOTE — Assessment & Plan Note (Signed)
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 Provisional headache seems to stem from left cervical tension/strain. She can continue to take Flexeril for relief of this. Discussed try without over-the-counter analgesics or analgesics anytime. Patient willing try this although she is somewhat doubtful that she can go for more than 2 or 3 days without taking something to relieve her headache. Followup with PCP about 2 weeks to see if she's had any improvement.

## 2012-12-22 NOTE — Assessment & Plan Note (Signed)
Healing well. Can continue to use ice on forehead to help with hematoma resolution. No red flags or concerns since fall last week.   Checking Tegretol level today, although fall does not seem to be associated with seizure.

## 2012-12-24 LAB — CARBAMAZEPINE, FREE AND TOTAL

## 2012-12-25 ENCOUNTER — Ambulatory Visit: Payer: Self-pay | Admitting: Family Medicine

## 2012-12-25 ENCOUNTER — Telehealth: Payer: Self-pay | Admitting: Family Medicine

## 2012-12-25 DIAGNOSIS — G40802 Other epilepsy, not intractable, without status epilepticus: Secondary | ICD-10-CM

## 2012-12-25 NOTE — Telephone Encounter (Signed)
White Team -- Patient seen by me, evidently Tegretol level could not be done?  Would you guys mind calling and letting her know that this needs to be repeated.  I have put this order in.  Needs lab appt.  You guys are the best!  Thanks,  Kristin [name]

## 2012-12-26 ENCOUNTER — Other Ambulatory Visit: Payer: Self-pay

## 2012-12-26 NOTE — Telephone Encounter (Signed)
Pt called at [phone], and told needs to schedule a lab appt.  Pt verbalized understanding.  Juandedios Dudash, Darlyne Russian, CMA

## 2012-12-27 ENCOUNTER — Other Ambulatory Visit: Payer: Self-pay

## 2012-12-27 DIAGNOSIS — G40802 Other epilepsy, not intractable, without status epilepticus: Secondary | ICD-10-CM

## 2012-12-27 NOTE — Progress Notes (Signed)
CARBAMAZEPINE LEVEL DONE TODAY [name] 

## 2012-12-31 ENCOUNTER — Telehealth: Payer: Self-pay | Admitting: Family Medicine

## 2012-12-31 ENCOUNTER — Encounter: Payer: Self-pay | Admitting: Family Medicine

## 2012-12-31 NOTE — Telephone Encounter (Signed)
LM for patient to return call to her doctor's office.  Landan Fedie, Darlyne Russian, CMA

## 2012-12-31 NOTE — Telephone Encounter (Signed)
Can you guys call Ms Kristin [name] to ensure that she is taking her Tegretol/Carbamazepine?  Her blood level was low and the dosage should be increased, if she's taking this.  She needs to schedule an appt with her PCP THIS WEEK (or someone on [facility] Team as her PCP just graduated) to go over this.  Thanks!  Trey Paula

## 2012-12-31 NOTE — Telephone Encounter (Signed)
Pt is taking Tegretol 200mg  three tablets in the evening.  She is compliant with every dose per pt.  Instructed pt to schedule with PCP. Or blue team MD this week.  Jaselyn Nahm, Darlyne Russian, CMA    Jozi Malachi, [place], [place]

## 2013-01-15 NOTE — Telephone Encounter (Signed)
Error

## 2013-02-07 ENCOUNTER — Ambulatory Visit: Payer: Self-pay

## 2013-02-20 ENCOUNTER — Other Ambulatory Visit: Payer: Self-pay | Admitting: Family Medicine

## 2013-02-20 MED ORDER — CARBAMAZEPINE ER 200 MG PO TB12: ORAL_TABLET | ORAL | Status: DC

## 2013-03-06 ENCOUNTER — Other Ambulatory Visit: Payer: Self-pay | Admitting: *Deleted

## 2013-03-06 ENCOUNTER — Other Ambulatory Visit: Payer: Self-pay | Admitting: Family Medicine

## 2013-03-06 MED ORDER — PHENYTOIN SODIUM EXTENDED 100 MG PO CAPS: 300.0000 mg | ORAL_CAPSULE | ORAL | Status: DC

## 2013-03-08 ENCOUNTER — Other Ambulatory Visit: Payer: Self-pay | Admitting: Family Medicine

## 2013-03-08 MED ORDER — PHENYTOIN SODIUM EXTENDED 100 MG PO CAPS: 300.0000 mg | ORAL_CAPSULE | ORAL | Status: DC

## 2013-03-19 ENCOUNTER — Encounter: Payer: Self-pay | Admitting: Family Medicine

## 2013-03-19 ENCOUNTER — Ambulatory Visit (INDEPENDENT_AMBULATORY_CARE_PROVIDER_SITE_OTHER): Payer: No Typology Code available for payment source | Admitting: Family Medicine

## 2013-03-19 VITALS — BP 148/95 | HR 98 | Temp 98.4°F | Wt 217.0 lb

## 2013-03-19 DIAGNOSIS — I10 Essential (primary) hypertension: Secondary | ICD-10-CM

## 2013-03-19 DIAGNOSIS — Z23 Encounter for immunization: Secondary | ICD-10-CM

## 2013-03-19 DIAGNOSIS — R51 Headache: Secondary | ICD-10-CM

## 2013-03-19 LAB — BASIC METABOLIC PANEL
BUN: 8 mg/dL (ref 6–23)
Creat: 0.87 mg/dL (ref 0.50–1.10)
Glucose, Bld: 87 mg/dL (ref 70–99)

## 2013-03-19 MED ORDER — KETOROLAC TROMETHAMINE 30 MG/ML IJ SOLN
30.0000 mg | Freq: Once | INTRAMUSCULAR | Status: AC
  Administered 2013-03-19: 30 mg via INTRAMUSCULAR

## 2013-03-19 MED ORDER — NAPROXEN 500 MG PO TABS: 500.0000 mg | ORAL_TABLET | Freq: Two times a day (BID) | ORAL | Status: DC

## 2013-03-19 MED ORDER — KETOROLAC TROMETHAMINE 30 MG/ML IJ SOLN: 30.0000 mg | Freq: Once | INTRAMUSCULAR | Status: DC

## 2013-03-19 NOTE — Assessment & Plan Note (Signed)
HA could likely be secondary to cervical strain. Other possibilities would be medication overuse/rebound headache, migraine or sleep apnea since worse in the AM. Will start by stopping ALL over the counter medications. Prescribed naprosyn for her to take as abortive therapy. If not helping, will add TCA (Nortriptyline) for ppx treatment. Given neck ROM exercises to help with muscle tightness. F/u in 3-4 weeks.

## 2013-03-19 NOTE — Patient Instructions (Addendum)
We are going to try a few things for your headache. This could be coming from your neck strain, but first lets stop your over-the-counter medications. You can take Naprosyn when your headache is severe to stop the pain. Try the following exercises for your neck and use a heating pad to loosen it up.  Please follow up with me in one month.  Arek Spadafore M. Jacorie Ernsberger, M.D.

## 2013-03-19 NOTE — Assessment & Plan Note (Signed)
Slightly above goal today but in pain. Will recheck at next visit and restart HCTZ if needed. BMet today.

## 2013-03-19 NOTE — Assessment & Plan Note (Signed)
" >>  ASSESSMENT AND PLAN FOR HYPERTENSION, BENIGN SYSTEMIC WRITTEN ON 03/19/2013  5:21 PM BY Garnett Nunziata M, MD  Slightly above goal today but in pain. Will recheck at next visit and restart HCTZ if needed. BMet today.  "

## 2013-03-19 NOTE — Progress Notes (Signed)
Patient ID: Kristin [name], female   DOB: January 08, 1956, 57 y.o.   MRN: [id number]  [facility] Brach Birdsall M. Georgeann Brinkman, MD Phone: [phone]   Subjective: HPI: Patient is a 57 y.o. female presenting to clinic today for follow .  1. Headaches - Chronic history of headaches. Getting worse. Takes Aleve, Tylenol, BC powders which helps a little but does not take it away. Has been seen in the ED and was told she had cervical strain. She has her headaches in back of head and on left side of head. Some photophobia, nausea. HA is worse in the morning. She states when she moves her neck she feels like it "locks up" and neck pops sometimes.   2. Hypertension Blood pressure today: 145/95 Taking Meds: On Norvasc and Toprol, no longer on HCTZ ROS: Denies headache, visual changes, nausea, vomiting, chest pain, abdominal pain or shortness of breath.  3. Seizures - No seizures in a few months. Taking Tegretol daily and Dilantin. Has not seen a neurologist in many years. Would like blood level today.  History Reviewed: Everyday smoker - cut down to 3 cigs/day, ready to quit.  Health Maintenance: Needs flu shot and mammogram  ROS: Please see HPI above.  Objective: Office vital signs reviewed. BP 148/95  Pulse 98  Temp(Src) 98.4 F (36.9 C) (Oral)  Wt 217 lb (98.431 kg)  BMI 39.68 kg/m2  Physical Examination:  General: Awake, alert. Appears uncomfortable especially with neck movement. HEENT: Atraumatic, normocephalic. MMM. Neck: No masses palpated. No LAD. Audible pop when looking down. ROM severely limited in all directions due to pain. Pulm: CTAB, no wheezes Cardio: RRR, no murmurs appreciated Abdomen:+BS, soft, nontender, nondistended Extremities: No edema, no weakness or tingling of arms. Neuro: Grossly intact. CN 2-12 intact.  Assessment: 57 y.o. female with headaches and HTN  Plan: See Problem List and After Visit Summary

## 2013-03-24 LAB — CARBAMAZEPINE, FREE AND TOTAL
Carbamazepine Metabolite: 1.1 ug/mL — ABNORMAL HIGH (ref 0.1–1.0)
Carbamazepine, Bound: 3.4 ug/mL
Carbamazepine, Free: 1 ug/mL (ref 1.0–3.0)

## 2013-03-28 ENCOUNTER — Other Ambulatory Visit: Payer: Self-pay | Admitting: Family Medicine

## 2013-03-28 MED ORDER — METOPROLOL SUCCINATE ER 100 MG PO TB24: 100.0000 mg | ORAL_TABLET | Freq: Every day | ORAL | Status: DC

## 2013-03-28 MED ORDER — ROSUVASTATIN CALCIUM 20 MG PO TABS: 20.0000 mg | ORAL_TABLET | Freq: Every day | ORAL | Status: DC

## 2013-03-28 NOTE — Progress Notes (Signed)
Received faxes from [facility] that patient has not picked up Toprol since June 2013, and has not picked up Crestor since April 2013.  These medications reordered through [facility], but will need to discuss medication adherence with patient at next visit.   Amber M. Hairford, M.D.

## 2013-04-11 ENCOUNTER — Other Ambulatory Visit: Payer: Self-pay | Admitting: Family Medicine

## 2013-04-11 MED ORDER — CARBAMAZEPINE ER 200 MG PO TB12: ORAL_TABLET | ORAL | Status: DC

## 2013-04-17 ENCOUNTER — Ambulatory Visit (INDEPENDENT_AMBULATORY_CARE_PROVIDER_SITE_OTHER): Payer: No Typology Code available for payment source | Admitting: Family Medicine

## 2013-04-17 ENCOUNTER — Encounter: Payer: Self-pay | Admitting: Family Medicine

## 2013-04-17 VITALS — BP 135/84 | HR 87 | Temp 98.3°F | Wt 216.0 lb

## 2013-04-17 DIAGNOSIS — R300 Dysuria: Secondary | ICD-10-CM

## 2013-04-17 DIAGNOSIS — R51 Headache: Secondary | ICD-10-CM

## 2013-04-17 DIAGNOSIS — R1030 Lower abdominal pain, unspecified: Secondary | ICD-10-CM

## 2013-04-17 DIAGNOSIS — R109 Unspecified abdominal pain: Secondary | ICD-10-CM

## 2013-04-17 LAB — POCT URINALYSIS DIPSTICK
Bilirubin, UA: NEGATIVE
Glucose, UA: NEGATIVE
Nitrite, UA: NEGATIVE
Urobilinogen, UA: 0.2

## 2013-04-17 LAB — POCT UA - MICROSCOPIC ONLY

## 2013-04-17 MED ORDER — KETOROLAC TROMETHAMINE 30 MG/ML IJ SOLN
30.0000 mg | Freq: Once | INTRAMUSCULAR | Status: AC
  Administered 2013-04-17: 30 mg via INTRAMUSCULAR

## 2013-04-17 MED ORDER — NORTRIPTYLINE HCL 25 MG PO CAPS: 25.0000 mg | ORAL_CAPSULE | Freq: Every day | ORAL | Status: DC

## 2013-04-17 NOTE — Progress Notes (Signed)
Patient ID: Kristin [name], female   DOB: February 07, 1956, 57 y.o.   MRN: [id number]  [facility] Kristin Casco M. Lenah Messenger, MD Phone: [phone]   Subjective: HPI: Patient is a 57 y.o. female presenting to clinic today for follow up appointment. Concerns today include right side pain and headaches  1. Headaches : Tried on Naprosyn which did not help. States she has stopped all other medications.  Started doing cervical exercises which did not help (made it worse.) Using Lidoderm patches which helped. Hurts to move head from lying position. This is a chronic problem for her that is not worse, but still interferes with her daily life. The only thing she has tried which helped was a Toradol shot at her last visit.   2. Pain : Patient reports pain in right side of groin. 3 days last week, improved now but still has some discomfort with shifting positions while sitting. No suprapubic tenderness, no CVA tenderness. She has not been able to feel any lumps or bumps.  History Reviewed: Daily smoker. Health Maintenance: Had flu shot last visit.  ROS: Please see HPI above.  Objective: Office vital signs reviewed. BP 135/84  Pulse 87  Temp(Src) 98.3 F (36.8 C) (Oral)  Wt 216 lb (97.977 kg)  BMI 39.5 kg/m2  Physical Examination:  General: Awake, alert. NAD HEENT: Atraumatic, normocephalic. TTP of occiput. Pupils equal and reactive.  Neck: No masses palpated. No LAD. Limited ROM due to pain Pulm: CTAB, no wheezes Cardio: RRR, no murmurs appreciated Abdomen:+BS, soft, nontender, nondistended GU: No external lesions, bumps or blisters. No TTP of labia. Mild bladder prolapse with bearing down. Extremities: No edema Neuro: Grossly intact  Assessment: 57 y.o. female follow up  Plan: See Problem List and After Visit Summary

## 2013-04-17 NOTE — Patient Instructions (Signed)
Take the Nortriptyline every night to see if it helps with your headache. Continue the Naprosyn as needed for breakthrough headache.  If your other pain does not improve, please let me know.  I will see you back in 6-8 weeks, or sooner if needed. We will repeat your labs at that time.  Seferino Oscar M. Khai Torbert, M.D.

## 2013-04-18 DIAGNOSIS — R1030 Lower abdominal pain, unspecified: Secondary | ICD-10-CM | POA: Insufficient documentation

## 2013-04-18 NOTE — Assessment & Plan Note (Signed)
UA wnl, GU exam normal. Con't to monitor, and return if pain worsens or changes.

## 2013-04-18 NOTE — Assessment & Plan Note (Signed)
Will try Nortriptyline qhs for ppx therapy. I still feel this is coming from her cervical spine. Con't Naprosyn prn for breakthrough pain. Toradol 30mg  shot given today. Needs labs at next visit.

## 2013-05-20 ENCOUNTER — Ambulatory Visit: Payer: No Typology Code available for payment source | Admitting: Family Medicine

## 2013-09-12 ENCOUNTER — Other Ambulatory Visit: Payer: Self-pay | Admitting: *Deleted

## 2013-09-12 MED ORDER — PHENYTOIN SODIUM EXTENDED 100 MG PO CAPS: 300.0000 mg | ORAL_CAPSULE | ORAL | Status: DC

## 2013-09-12 MED ORDER — PANTOPRAZOLE SODIUM 40 MG PO TBEC: 40.0000 mg | DELAYED_RELEASE_TABLET | Freq: Every day | ORAL | Status: DC

## 2013-09-19 ENCOUNTER — Telehealth: Payer: Self-pay | Admitting: *Deleted

## 2013-09-19 NOTE — Telephone Encounter (Signed)
Per MAP, pt is disenrolled for non-compliance.  Pt has not picked up Dilantin since 05-06-13.  Derl Barrow, RN

## 2013-12-12 ENCOUNTER — Emergency Department (HOSPITAL_COMMUNITY)
Admission: EM | Admit: 2013-12-12 | Discharge: 2013-12-12 | Disposition: A | Discharge status: Home / Self Care | Payer: Self-pay | Attending: Emergency Medicine | Admitting: Emergency Medicine

## 2013-12-12 ENCOUNTER — Encounter (HOSPITAL_COMMUNITY): Payer: Self-pay | Admitting: Emergency Medicine

## 2013-12-12 DIAGNOSIS — M5412 Radiculopathy, cervical region: Secondary | ICD-10-CM | POA: Insufficient documentation

## 2013-12-12 DIAGNOSIS — Z79899 Other long term (current) drug therapy: Secondary | ICD-10-CM | POA: Insufficient documentation

## 2013-12-12 DIAGNOSIS — Z791 Long term (current) use of non-steroidal anti-inflammatories (NSAID): Secondary | ICD-10-CM | POA: Insufficient documentation

## 2013-12-12 DIAGNOSIS — G8929 Other chronic pain: Secondary | ICD-10-CM | POA: Insufficient documentation

## 2013-12-12 DIAGNOSIS — E669 Obesity, unspecified: Secondary | ICD-10-CM | POA: Insufficient documentation

## 2013-12-12 DIAGNOSIS — F329 Major depressive disorder, single episode, unspecified: Secondary | ICD-10-CM | POA: Insufficient documentation

## 2013-12-12 DIAGNOSIS — R509 Fever, unspecified: Secondary | ICD-10-CM | POA: Insufficient documentation

## 2013-12-12 DIAGNOSIS — F17200 Nicotine dependence, unspecified, uncomplicated: Secondary | ICD-10-CM | POA: Insufficient documentation

## 2013-12-12 DIAGNOSIS — K219 Gastro-esophageal reflux disease without esophagitis: Secondary | ICD-10-CM | POA: Insufficient documentation

## 2013-12-12 DIAGNOSIS — Z8659 Personal history of other mental and behavioral disorders: Secondary | ICD-10-CM | POA: Insufficient documentation

## 2013-12-12 DIAGNOSIS — E785 Hyperlipidemia, unspecified: Secondary | ICD-10-CM | POA: Insufficient documentation

## 2013-12-12 DIAGNOSIS — I10 Essential (primary) hypertension: Secondary | ICD-10-CM | POA: Insufficient documentation

## 2013-12-12 DIAGNOSIS — G40909 Epilepsy, unspecified, not intractable, without status epilepticus: Secondary | ICD-10-CM | POA: Insufficient documentation

## 2013-12-12 DIAGNOSIS — F3289 Other specified depressive episodes: Secondary | ICD-10-CM | POA: Insufficient documentation

## 2013-12-12 MED ORDER — PREDNISONE 10 MG PO TABS: 20.0000 mg | ORAL_TABLET | Freq: Every day | ORAL | Status: DC

## 2013-12-12 MED ORDER — KETOROLAC TROMETHAMINE 30 MG/ML IJ SOLN
30.0000 mg | Freq: Once | INTRAMUSCULAR | Status: AC
  Administered 2013-12-12: 30 mg via INTRAMUSCULAR
  Filled 2013-12-12: qty 1

## 2013-12-12 MED ORDER — PREDNISONE 20 MG PO TABS
60.0000 mg | ORAL_TABLET | Freq: Once | ORAL | Status: AC
  Administered 2013-12-12: 60 mg via ORAL
  Filled 2013-12-12: qty 3

## 2013-12-12 MED ORDER — HYDROCODONE-ACETAMINOPHEN 5-325 MG PO TABS: 1.0000 | ORAL_TABLET | ORAL | Status: DC | PRN

## 2013-12-12 MED ORDER — METHOCARBAMOL 500 MG PO TABS: 500.0000 mg | ORAL_TABLET | Freq: Two times a day (BID) | ORAL | Status: DC

## 2013-12-12 NOTE — ED Provider Notes (Signed)
CSN: [id number]     Arrival date & time 12/12/13  1539 History  This chart was scribed for Hazel Sams, PA working with Fredia Sorrow, MD by Randa Evens, ED Scribe. This patient was seen in room TR08C/TR08C and the patient's care was started at 5:30 PM.      Chief Complaint  Patient presents with  . Neck Pain  . Headache   The history is provided by the patient. No language interpreter was used.   HPI Comments: Kristin [name] is a 58 y.o. female who presents to the Emergency Department complaining of left shoulder pain onset 6 months prior. She states she has associated neck pain, headaches, back pain, nausea, and chills. She describes the pain as burning and shooting. She states that the pain radiates up to her neck and down into her fingers. She states that when she is lying down it feels like her shoulder wants to dislocate. She states that moving rapidly worsens the pain. She states that she has been using ibuprofen, aleve, and lidocaine patches with some improvement to her symptoms. She denies dizziness, fever or any other related symptoms. No IVD use. No injury or trauma.    Past Medical History  Diagnosis Date  . Back pain, chronic   . GERD (gastroesophageal reflux disease)   . Obesity   . Tobacco abuse   . Hypertension   . Hyperlipidemia   . Seizures   . Depression   . Somatization disorder   . Renal sclerosis, unspecified     only has 1 kidney   Past Surgical History  Procedure Laterality Date  . Tubal ligation    . Abdominal hysterectomy    . Cholecystectomy     No family history on file. History  Substance Use Topics  . Smoking status: Current Every Day Smoker -- 0.25 packs/day for .5 years    Types: Cigarettes  . Smokeless tobacco: Not on file  . Alcohol Use: No   OB History   Grav Para Term Preterm Abortions TAB SAB Ect Mult Living                 Review of Systems  Constitutional: Positive for chills.  Eyes: Negative for visual disturbance.   All other systems reviewed and are negative.  Allergies  Fluconazole and Robitussin (alcohol free)  Home Medications   Prior to Admission medications   Medication Sig Start Date End Date Taking? Authorizing Provider  acetaminophen (TYLENOL) 500 MG tablet Take 1,000 mg by mouth every 6 (six) hours as needed for pain.    Historical Provider, MD  amLODipine (NORVASC) 10 MG tablet Take 5 mg by mouth daily. 04/12/12   Cletus Gash, MD  carbamazepine (TEGRETOL XR) 200 MG 12 hr tablet Take one tab PO in am, 2 tabs PO qhs. 04/11/13   Amber Fidel Levy, MD  cyclobenzaprine (FLEXERIL) 10 MG tablet Take 1 tablet (10 mg total) by mouth 2 (two) times daily as needed for muscle spasms. 12/21/12   Alveda Reasons, MD  metoprolol succinate (TOPROL-XL) 100 MG 24 hr tablet Take 1 tablet (100 mg total) by mouth daily. 03/28/13   Amber Fidel Levy, MD  naproxen (NAPROSYN) 500 MG tablet Take 1 tablet (500 mg total) by mouth 2 (two) times daily with a meal. 03/19/13   Montez Morita, MD  nortriptyline (PAMELOR) 25 MG capsule Take 1 capsule (25 mg total) by mouth at bedtime. 04/17/13   Amber Fidel Levy, MD  pantoprazole (PROTONIX) 40 MG tablet  Take 1 tablet (40 mg total) by mouth daily. 09/12/13   Amber Fidel Levy, MD  phenytoin (DILANTIN) 100 MG ER capsule Take 3 capsules (300 mg total) by mouth every morning. 09/12/13 09/12/14  Amber Fidel Levy, MD  rosuvastatin (CRESTOR) 20 MG tablet Take 1 tablet (20 mg total) by mouth at bedtime. 03/28/13   Amber Fidel Levy, MD  venlafaxine (EFFEXOR) 75 MG tablet Take 75-150 mg by mouth See admin instructions. Takes 2 tablets in the morning and 1 tablet at night. 10/03/12   Cletus Gash, MD   Triage Vitals: BP 174/97  Pulse 109  Temp(Src) 99.1 F (37.3 C) (Oral)  Resp 20  Ht 5\' 2"  (1.575 m)  Wt 235 lb (106.595 kg)  BMI 42.97 kg/m2  SpO2 97%  Physical Exam  Nursing note and vitals reviewed. Constitutional: She is oriented to person, place, and time. She appears  well-developed and well-nourished. No distress.  HENT:  Head: Normocephalic and atraumatic.  Eyes: Conjunctivae and EOM are normal.  Neck: Normal range of motion. No tracheal deviation present.  No cervical midline tenderness. There is some increased pain with bilateral rotation of the head.  ROM is otherwise full.  Cardiovascular: Normal rate, regular rhythm and normal heart sounds.   Pulses:      Radial pulses are 2+ on the right side, and 2+ on the left side.  Pulmonary/Chest: Effort normal. No respiratory distress.  Musculoskeletal: Normal range of motion. She exhibits no edema.  Tenderness in left trapezius. No gross deformities or mass. There is also diffuse anterior shoulder tenderness. No deformity. Reduced range of motion of the shoulder secondary to pain. Grip strength normal.   Lymphadenopathy:    She has no cervical adenopathy.  Neurological: She is alert and oriented to person, place, and time.  Reflex Scores:      Bicep reflexes are 2+ on the right side and 2+ on the left side.      Brachioradialis reflexes are 2+ on the right side and 2+ on the left side. Skin: Skin is warm and dry.  Psychiatric: She has a normal mood and affect. Her behavior is normal.    ED Course  Procedures DIAGNOSTIC STUDIES: Oxygen Saturation is 97% on RA, normal by my interpretation.    COORDINATION OF CARE: 5:45 PM-Discussed treatment plan which includes pain medication with pt at bedside and pt agreed to plan.   Patient has had ongoing and worsening symptoms for 6 months or more. No new injury or concerning red flag symptoms. Patient does have muscle skeletal tenderness. No gross deformities. This time we'll recommend conservative medical treatments and encourage PCP and orthopedic followup.   MDM   Final diagnoses:  Cervical radicular pain   I personally performed the services described in this documentation, which was scribed in my presence. The recorded information has been reviewed  and is accurate.      Martie Lee, PA-C 12/12/13 1754

## 2013-12-12 NOTE — ED Notes (Signed)
PT states she has had neck pain that radiates to both her shoulders for about a year.  For several months the has been experiencing headaches with the neck pain.  Denies photophobia or blurred vision.  Pt states when she takes ibuprofen it lasts only for several hours.  She is wearing her husband's fentanyl patch with minimal relief.  States muscle relaxants worked in the past.

## 2013-12-12 NOTE — Discharge Instructions (Signed)
Please use the medications prescribed and followup with a primary care provider or an orthopedic specialist for continued evaluation and treatment of your neck and shoulder pain.    Cervical Radiculopathy Cervical radiculopathy happens when a nerve in the neck is pinched or bruised by a slipped (herniated) disk or by arthritic changes in the bones of the cervical spine. This can occur due to an injury or as part of the normal aging process. Pressure on the cervical nerves can cause pain or numbness that runs from your neck all the way down into your arm and fingers. CAUSES  There are many possible causes, including:  Injury.  Muscle tightness in the neck from overuse.  Swollen, painful joints (arthritis).  Breakdown or degeneration in the bones and joints of the spine (spondylosis) due to aging.  Bone spurs that may develop near the cervical nerves. SYMPTOMS  Symptoms include pain, weakness, or numbness in the affected arm and hand. Pain can be severe or irritating. Symptoms may be worse when extending or turning the neck. DIAGNOSIS  Your caregiver will ask about your symptoms and do a physical exam. He or she may test your strength and reflexes. X-rays, CT scans, and MRI scans may be needed in cases of injury or if the symptoms do not go away after a period of time. Electromyography (EMG) or nerve conduction testing may be done to study how your nerves and muscles are working. TREATMENT  Your caregiver may recommend certain exercises to help relieve your symptoms. Cervical radiculopathy can, and often does, get better with time and treatment. If your problems continue, treatment options may include:  Wearing a soft collar for short periods of time.  Physical therapy to strengthen the neck muscles.  Medicines, such as nonsteroidal anti-inflammatory drugs (NSAIDs), oral corticosteroids, or spinal injections.  Surgery. Different types of surgery may be done depending on the cause of your  problems. HOME CARE INSTRUCTIONS   Put ice on the affected area.  Put ice in a plastic bag.  Place a towel between your skin and the bag.  Leave the ice on for 15-20 minutes, 03-04 times a day or as directed by your caregiver.  If ice does not help, you can try using heat. Take a warm shower or bath, or use a hot water bottle as directed by your caregiver.  You may try a gentle neck and shoulder massage.  Use a flat pillow when you sleep.  Only take over-the-counter or prescription medicines for pain, discomfort, or fever as directed by your caregiver.  If physical therapy was prescribed, follow your caregiver's directions.  If a soft collar was prescribed, use it as directed. SEEK IMMEDIATE MEDICAL CARE IF:   Your pain gets much worse and cannot be controlled with medicines.  You have weakness or numbness in your hand, arm, face, or leg.  You have a high fever or a stiff, rigid neck.  You lose bowel or bladder control (incontinence).  You have trouble with walking, balance, or speaking. MAKE SURE YOU:   Understand these instructions.  Will watch your condition.  Will get help right away if you are not doing well or get worse. Document Released: 03/15/2001 Document Revised: 09/12/2011 Document Reviewed: 02/01/2011 [facility] Patient Information 2014 [place], [place].

## 2013-12-14 NOTE — ED Provider Notes (Signed)
Medical screening examination/treatment/procedure(s) were performed by non-physician practitioner and as supervising physician I was immediately available for consultation/collaboration.   EKG Interpretation None        Fredia Sorrow, MD 12/14/13 [phone]

## 2014-01-08 ENCOUNTER — Encounter (HOSPITAL_COMMUNITY): Payer: Self-pay | Admitting: Emergency Medicine

## 2014-01-08 ENCOUNTER — Emergency Department (HOSPITAL_COMMUNITY)
Admission: EM | Admit: 2014-01-08 | Discharge: 2014-01-08 | Disposition: A | Discharge status: Home / Self Care | Payer: Self-pay | Attending: Emergency Medicine | Admitting: Emergency Medicine

## 2014-01-08 DIAGNOSIS — F329 Major depressive disorder, single episode, unspecified: Secondary | ICD-10-CM | POA: Insufficient documentation

## 2014-01-08 DIAGNOSIS — M542 Cervicalgia: Secondary | ICD-10-CM | POA: Insufficient documentation

## 2014-01-08 DIAGNOSIS — G8929 Other chronic pain: Secondary | ICD-10-CM | POA: Insufficient documentation

## 2014-01-08 DIAGNOSIS — G44221 Chronic tension-type headache, intractable: Secondary | ICD-10-CM

## 2014-01-08 DIAGNOSIS — I10 Essential (primary) hypertension: Secondary | ICD-10-CM | POA: Insufficient documentation

## 2014-01-08 DIAGNOSIS — Z8719 Personal history of other diseases of the digestive system: Secondary | ICD-10-CM | POA: Insufficient documentation

## 2014-01-08 DIAGNOSIS — Z79899 Other long term (current) drug therapy: Secondary | ICD-10-CM | POA: Insufficient documentation

## 2014-01-08 DIAGNOSIS — E669 Obesity, unspecified: Secondary | ICD-10-CM | POA: Insufficient documentation

## 2014-01-08 DIAGNOSIS — F3289 Other specified depressive episodes: Secondary | ICD-10-CM | POA: Insufficient documentation

## 2014-01-08 DIAGNOSIS — Z791 Long term (current) use of non-steroidal anti-inflammatories (NSAID): Secondary | ICD-10-CM | POA: Insufficient documentation

## 2014-01-08 DIAGNOSIS — G40909 Epilepsy, unspecified, not intractable, without status epilepticus: Secondary | ICD-10-CM | POA: Insufficient documentation

## 2014-01-08 DIAGNOSIS — F17200 Nicotine dependence, unspecified, uncomplicated: Secondary | ICD-10-CM | POA: Insufficient documentation

## 2014-01-08 DIAGNOSIS — G44229 Chronic tension-type headache, not intractable: Secondary | ICD-10-CM | POA: Insufficient documentation

## 2014-01-08 MED ORDER — DIAZEPAM 5 MG/ML IJ SOLN
5.0000 mg | Freq: Once | INTRAMUSCULAR | Status: AC
  Administered 2014-01-08: 5 mg via INTRAVENOUS
  Filled 2014-01-08: qty 2

## 2014-01-08 MED ORDER — TRAMADOL HCL 50 MG PO TABS
50.0000 mg | ORAL_TABLET | Freq: Once | ORAL | Status: AC
  Administered 2014-01-08: 50 mg via ORAL
  Filled 2014-01-08: qty 1

## 2014-01-08 MED ORDER — DIAZEPAM 5 MG PO TABS: 5.0000 mg | ORAL_TABLET | Freq: Two times a day (BID) | ORAL | Status: DC

## 2014-01-08 MED ORDER — TRAMADOL HCL 50 MG PO TABS: 50.0000 mg | ORAL_TABLET | Freq: Four times a day (QID) | ORAL | Status: DC | PRN

## 2014-01-08 MED ORDER — SODIUM CHLORIDE 0.9 % IV BOLUS (SEPSIS)
1000.0000 mL | Freq: Once | INTRAVENOUS | Status: AC
  Administered 2014-01-08: 1000 mL via INTRAVENOUS

## 2014-01-08 MED ORDER — PREDNISONE 20 MG PO TABS
60.0000 mg | ORAL_TABLET | Freq: Once | ORAL | Status: AC
  Administered 2014-01-08: 60 mg via ORAL
  Filled 2014-01-08: qty 3

## 2014-01-08 MED ORDER — KETOROLAC TROMETHAMINE 30 MG/ML IJ SOLN
30.0000 mg | Freq: Once | INTRAMUSCULAR | Status: AC
  Administered 2014-01-08: 30 mg via INTRAVENOUS
  Filled 2014-01-08: qty 1

## 2014-01-08 NOTE — ED Notes (Signed)
Pt reports having headaches for extended amount of time but more severe for last 2-3 days, more left sided headache. Reports driving this am and had feeling of being disoriented. No neuro deficits noted at triage.

## 2014-01-08 NOTE — ED Notes (Signed)
Pt alert x4 states improvement in pain, ambulates with steady gait.

## 2014-01-08 NOTE — Progress Notes (Signed)
  CARE MANAGEMENT ED NOTE 01/08/2014  Patient:  Kristin [name], Kristin [name]   Account Number:  [id number]  Date Initiated:  01/08/2014  Documentation initiated by:  Edwyna Shell  Subjective/Objective Assessment:   58 yo female presenting to the ED with c/o headache     Subjective/Objective Assessment Detail:     Action/Plan:   Patient will follow up with Family Practice on Friday at 2:45   Action/Plan Detail:   Anticipated DC Date:       Status Recommendation to Physician:   Result of Recommendation:  Agreed    [place]  CM consult  PCP issues  Follow-up appt scheduled    Choice offered to / List presented to:            Status of service:  Completed, signed off  ED Comments:   ED Comments Detail:  CM consulted to assist patient with PCP follow up. This CM spoke with the patient and she stated that she was a patient at [facility] and she did have the orange card. She stated that she did not reapply for the orange card and so the services were dropped. This CM discussed the importance of following with a PCP fro management of chronic conditions as opposed to the ED. The patient verbalized understanding. This CM spoke with Kennyth Lose at [facility] and discussed the recent ED visits by the patient. Kennyth Lose stated that the patient is still active with the practice and was able to set up an appointment for this Friday at 2:25, patient aware of and agreed to appointment time. Kennyth Lose stated that the office will work with her with payments and would not turn anyone away for inability to pay. Kennyth Lose stated that she would give the patient an orange card application during her visit and have her schedule an appointment with Pamala Hurry to reapply for the orange card. The patient verbalized understanding and appreciation for assist and had no further questions. ED PA Junie Panning was updated and follow up appointment obtained.

## 2014-01-08 NOTE — Discharge Planning (Signed)
[place] to the patient about primary care resources and the [facility] orange card. Patient states she has received care in the past at [facility]. Orange card application was provided to complete and take to her upcoming appointment with the practice. Resource guide and my contact information was also provided for any future questions or concerns.

## 2014-01-08 NOTE — ED Provider Notes (Signed)
CSN: [id number]     Arrival date & time 01/08/14  1157 History   First MD Initiated Contact with Patient 01/08/14 1239     Chief Complaint  Patient presents with  . Headache     (Consider location/radiation/quality/duration/timing/severity/associated sxs/prior Treatment) HPI Pt is a 58yo female with hx of chronic back pain GERD, obeseity, HTN, seizures, depression, somatization disorder, and renal sclerosis presenting to ED c/o left sided neck pain and left sided headache that has been present for several weeks but worsened for last 2-3 days.  Pt states pain is constant, waxing and waning, 10/10 at worst associated with blurred vision.  Turning head to left makes pain worse. Denies known injury.  Reports being seen in ED for similar pain last month w/o complete relief of pain.  Medical records indicate pt was dx with cervical radicular pain. Pt reports hx of tension headaches and states this feels similar in nature, just more severe and persistent. No relief with norco, naproxen, flexeril or hot and cold packs.  Denies fever, n/v/d. Denies chest pain or trouble breathing.  Denies hx of migraines but does report hx of tension headaches. States she has not seen a neurologist but is in the process of reestablishing care with a PCP.    Past Medical History  Diagnosis Date  . Back pain, chronic   . GERD (gastroesophageal reflux disease)   . Obesity   . Tobacco abuse   . Hypertension   . Hyperlipidemia   . Seizures   . Depression   . Somatization disorder   . Renal sclerosis, unspecified     only has 1 kidney   Past Surgical History  Procedure Laterality Date  . Tubal ligation    . Abdominal hysterectomy    . Cholecystectomy     History reviewed. No pertinent family history. History  Substance Use Topics  . Smoking status: Current Every Day Smoker -- 0.25 packs/day for .5 years    Types: Cigarettes  . Smokeless tobacco: Not on file  . Alcohol Use: No   OB History   Grav Para Term  Preterm Abortions TAB SAB Ect Mult Living                 Review of Systems  Constitutional: Negative for fever and chills.  Eyes: Positive for photophobia and visual disturbance. Negative for pain and redness.  Respiratory: Negative for cough and shortness of breath.   Cardiovascular: Negative for chest pain and palpitations.  Gastrointestinal: Negative for nausea, vomiting, abdominal pain and diarrhea.  Musculoskeletal: Positive for myalgias and neck pain. Negative for back pain and neck stiffness.  Skin: Negative for rash and wound.  Neurological: Positive for light-headedness and headaches. Negative for dizziness, tremors, seizures, syncope, speech difficulty, weakness and numbness.  All other systems reviewed and are negative.     Allergies  Fluconazole and Robitussin (alcohol free)  Home Medications   Prior to Admission medications   Medication Sig Start Date End Date Taking? Authorizing Provider  HYDROcodone-acetaminophen (NORCO/VICODIN) 5-325 MG per tablet Take 1 tablet by mouth every 4 (four) hours as needed for moderate pain. 12/12/13  Yes Peter S Dammen, PA-C  ibuprofen (ADVIL,MOTRIN) 200 MG tablet Take 400 mg by mouth every 6 (six) hours as needed for moderate pain.   Yes Historical Provider, MD  lidocaine (LIDODERM) 5 % Place 0.5 patches onto the skin every 8 (eight) hours as needed (pain).    Yes Historical Provider, MD  naproxen sodium (ANAPROX) 220 MG tablet Take  440 mg by mouth daily as needed (headache).   Yes Historical Provider, MD  diazepam (VALIUM) 5 MG tablet Take 1 tablet (5 mg total) by mouth 2 (two) times daily. 01/08/14   Noland Fordyce, PA-C  traMADol (ULTRAM) 50 MG tablet Take 1 tablet (50 mg total) by mouth every 6 (six) hours as needed. 01/08/14   Noland Fordyce, PA-C   BP 173/91  Pulse 77  Temp(Src) 98.7 F (37.1 C) (Oral)  Resp 18  Ht 5\' 2"  (1.575 m)  Wt 235 lb (106.595 kg)  BMI 42.97 kg/m2  SpO2 100% Physical Exam  Nursing note and vitals  reviewed. Constitutional: She is oriented to person, place, and time. She appears well-developed and well-nourished.  Pt lying in darkened exam room. Tearful.   HENT:  Head: Normocephalic and atraumatic.  Eyes: Conjunctivae and EOM are normal. Pupils are equal, round, and reactive to light. No scleral icterus.  Neck: Normal range of motion. Neck supple.  Tenderness along left cervical paraspinal muscles. No meningeal signs. No midline spinal tenderness.   Cardiovascular: Normal rate, regular rhythm and normal heart sounds.   Pulmonary/Chest: Effort normal and breath sounds normal. No respiratory distress. She has no wheezes. She has no rales. She exhibits no tenderness.  Abdominal: Soft. Bowel sounds are normal. She exhibits no distension and no mass. There is no tenderness. There is no rebound and no guarding.  Musculoskeletal: Normal range of motion.  Neurological: She is alert and oriented to person, place, and time. She has normal strength. No cranial nerve deficit or sensory deficit. Coordination normal. GCS eye subscore is 4. GCS verbal subscore is 5. GCS motor subscore is 6.  Skin: Skin is warm and dry.  Psychiatric: She has a normal mood and affect. Her behavior is normal.    ED Course  Procedures (including critical care time) Labs Review Labs Reviewed - No data to display  Imaging Review No results found.   EKG Interpretation None      MDM   Final diagnoses:  Chronic tension-type headache, intractable  Neck pain on left side    Pt is a 58yo female with hx of tension headaches and recent dx of cervical radicular pain last month for similar c/o headache and left sided neck pain.  Today, pt appears uncomfortable, but non-toxic. No focal neuro deficits.  Pt is tender along left cervical paraspinal muscles and left upper trapezius. No midline spinal tenderness. No meningeal signs. HA was gradual in onset, similar to previous. No concerned for [facility], CVA or other emergent  intracranial bleed at this time. Do not believe CT head would be of benefit at this time.  Pain likely muscular in nature. IV fluids, toradol, prednisone, valium and tramadol given in ED. Pain has improved from 10/10 to 7-8/10.  Will discharge pt home to f/u with PCP. Pt spoke with social work while in ED and able to schedule f/u appointment on Friday, 7/10, at [facility]. Home care instructions provided. Return precautions provided. Pt verbalized understanding and agreement with tx plan.     Noland Fordyce, PA-C 01/08/14 [phone]

## 2014-01-10 ENCOUNTER — Ambulatory Visit (INDEPENDENT_AMBULATORY_CARE_PROVIDER_SITE_OTHER): Payer: Self-pay | Admitting: Family Medicine

## 2014-01-10 ENCOUNTER — Encounter: Payer: Self-pay | Admitting: Family Medicine

## 2014-01-10 VITALS — BP 174/113 | HR 82 | Temp 98.4°F | Wt 220.0 lb

## 2014-01-10 DIAGNOSIS — I10 Essential (primary) hypertension: Secondary | ICD-10-CM

## 2014-01-10 DIAGNOSIS — R51 Headache: Secondary | ICD-10-CM

## 2014-01-10 LAB — SEDIMENTATION RATE: Sed Rate: 5 mm/hr (ref 0–22)

## 2014-01-10 MED ORDER — CYCLOBENZAPRINE HCL 10 MG PO TABS: 10.0000 mg | ORAL_TABLET | Freq: Three times a day (TID) | ORAL | Status: DC | PRN

## 2014-01-10 MED ORDER — METHYLPREDNISOLONE ACETATE 40 MG/ML IJ SUSP
40.0000 mg | Freq: Once | INTRAMUSCULAR | Status: AC
  Administered 2014-01-10: 40 mg via INTRAMUSCULAR

## 2014-01-10 MED ORDER — AMLODIPINE BESYLATE 10 MG PO TABS: 10.0000 mg | ORAL_TABLET | Freq: Every day | ORAL | Status: DC

## 2014-01-10 MED ORDER — DIPHENHYDRAMINE HCL 50 MG PO CAPS
50.0000 mg | ORAL_CAPSULE | Freq: Once | ORAL | Status: AC
  Administered 2014-01-10: 50 mg via ORAL

## 2014-01-10 MED ORDER — PROMETHAZINE HCL 12.5 MG PO TABS
25.0000 mg | ORAL_TABLET | Freq: Once | ORAL | Status: AC
  Administered 2014-01-10: 25 mg via ORAL

## 2014-01-10 NOTE — Patient Instructions (Signed)
Nice to meet you. We have given you medication to help with your headache. It sounds like a migraine. If you develop any numbness, weakness, change in speech, or the headache worsens please seek medical attention. Please take the muscle relaxer prescribed to help with the spasm in your neck. You should continue to use heat on this area as well.   Migraine Headache A migraine headache is an intense, throbbing pain on one or both sides of your head. A migraine can last for 30 minutes to several hours. CAUSES  The exact cause of a migraine headache is not always known. However, a migraine may be caused when nerves in the brain become irritated and release chemicals that cause inflammation. This causes pain. Certain things may also trigger migraines, such as:  Alcohol.  Smoking.  Stress.  Menstruation.  Aged cheeses.  Foods or drinks that contain nitrates, glutamate, aspartame, or tyramine.  Lack of sleep.  Chocolate.  Caffeine.  Hunger.  Physical exertion.  Fatigue.  Medicines used to treat chest pain (nitroglycerine), birth control pills, estrogen, and some blood pressure medicines. SIGNS AND SYMPTOMS  Pain on one or both sides of your head.  Pulsating or throbbing pain.  Severe pain that prevents daily activities.  Pain that is aggravated by any physical activity.  Nausea, vomiting, or both.  Dizziness.  Pain with exposure to bright lights, loud noises, or activity.  General sensitivity to bright lights, loud noises, or smells. Before you get a migraine, you may get warning signs that a migraine is coming (aura). An aura may include:  Seeing flashing lights.  Seeing bright spots, halos, or zig-zag lines.  Having tunnel vision or blurred vision.  Having feelings of numbness or tingling.  Having trouble talking.  Having muscle weakness. DIAGNOSIS  A migraine headache is often diagnosed based on:  Symptoms.  Physical exam.  A CT scan or MRI of your  head. These imaging tests cannot diagnose migraines, but they can help rule out other causes of headaches. TREATMENT Medicines may be given for pain and nausea. Medicines can also be given to help prevent recurrent migraines.  HOME CARE INSTRUCTIONS  Only take over-the-counter or prescription medicines for pain or discomfort as directed by your health care provider. The use of long-term narcotics is not recommended.  Lie down in a dark, quiet room when you have a migraine.  Keep a journal to find out what may trigger your migraine headaches. For example, write down:  What you eat and drink.  How much sleep you get.  Any change to your diet or medicines.  Limit alcohol consumption.  Quit smoking if you smoke.  Get 7-9 hours of sleep, or as recommended by your health care provider.  Limit stress.  Keep lights dim if bright lights bother you and make your migraines worse. SEEK IMMEDIATE MEDICAL CARE IF:   Your migraine becomes severe.  You have a fever.  You have a stiff neck.  You have vision loss.  You have muscular weakness or loss of muscle control.  You start losing your balance or have trouble walking.  You feel faint or pass out.  You have severe symptoms that are different from your first symptoms. MAKE SURE YOU:   Understand these instructions.  Will watch your condition.  Will get help right away if you are not doing well or get worse. Document Released: 06/20/2005 Document Revised: 04/10/2013 Document Reviewed: 02/25/2013 [facility] Patient Information 2015 [place], [place]. This information is not intended to  replace advice given to you by your health care provider. Make sure you discuss any questions you have with your health care provider.  

## 2014-01-10 NOTE — Progress Notes (Signed)
Patient ID: LORAH KALINA, female   DOB: 08/14/1955, 58 y.o.   MRN: [id number]  Tommi Rumps, MD Phone: [phone]  Kristin [name] is a 58 y.o. female who presents today for f/u.  Headache: patient notes left half of her head feels like it is filled with water. Going on for the past 3 days. She went to the ED for this on 7/8. They felt her pain was muscular in nature. She received toradol, prednisone, valium, and tramadol with some benefit. She notes the valium they sent her home with has not helped much. The headache is on the left side and with pain in left posterior neck. Gradual onset. Associated with photophobia and phonophobia. She denies weakness, numbness, fevers. She does note cramps in her legs. She has had similar headache previously over the past year. Notes her diagnosis is tension headaches. She states steroids have helped with this previously. Per review of Dr Darnelle Going last visit note it appears she though most of this was coming from the patients cervical spine.   HTN: Patient notes she is not on medication for her blood pressure. She states she has previously been on medication though took herself off of this. She does not check her blood pressure at home.  Patient is a smoker.   ROS: Per HPI   Physical Exam Filed Vitals:   01/10/14 1555  BP: 174/113  Pulse: 82  Temp: 98.4 F (36.9 C)      Gen: Well NAD HEENT: PERRL,  MMM Lungs: CTABL Nl WOB Heart: RRR no MRG Neuro: CN 2-12 intact, 5/5 strength in bilateral biceps, triceps, grip, quads, hamstrings, plantar and dorsiflexion, sensation to light touch intact bilateral UE and LE, 2+ patellar reflexes MSK: there is tenderness and spasm of the left trap, there is mild tenderness of the left temple, there are no apparent areas of swelling in her back or temple, no midline back tenderness Exts: Non edematous BL  LE, warm and well perfused.    Assessment/Plan: Please see individual problem list.  # Healthcare  maintenance: not addressed, needs stool cards and mammogram information once she gets [facility] card

## 2014-01-11 NOTE — Assessment & Plan Note (Signed)
" >>  ASSESSMENT AND PLAN FOR HYPERTENSION, BENIGN SYSTEMIC WRITTEN ON 01/11/2014  7:04 PM BY Joao Mccurdy G, MD  Blood pressure elevated on last 2 visits. Not on medications at this time for this issue. Will start on norvasc  10 mg. F/u in 1-2 weeks.  "

## 2014-01-11 NOTE — Assessment & Plan Note (Addendum)
Patient with what sounds like a migraine at this time with photophobia and phonphobia, though could be referred pain from her trapezius spasm that is evident on exam as well. Tenderness in temple raises concern for temporal arteritis, though ESR obtained and normal making this unlikely. She is neurologically intact at this time. She was given solumedrol IM in the office and a tablet of benadryl and phenergan to take once she gets home. Additionally given the level of spasm in her left trapezius I prescribed flexeril. If she continues to have issues with this type of pain we may need to consider XR of her neck to evaluate for cervical spine bony issue and/ro start her on a migraine prophylaxis medication. Given return precautions. F/u in 1-2 weeks.

## 2014-01-11 NOTE — Assessment & Plan Note (Signed)
Blood pressure elevated on last 2 visits. Not on medications at this time for this issue. Will start on norvasc 10 mg. F/u in 1-2 weeks.

## 2014-01-11 NOTE — ED Provider Notes (Signed)
Medical screening examination/treatment/procedure(s) were conducted as a shared visit with non-physician practitioner(s) and myself.  I personally evaluated the patient during the encounter.  Pt with hx intermittent headaches, c/o frontal headache, dull, constant, gradual onset. No neck pain or stiffness. No fevers. No eye pain or change in vision. No numbness/weakness. Headache markedly improved w meds.    Mirna Mires, MD 01/11/14 [id number]

## 2014-01-14 ENCOUNTER — Encounter: Payer: Self-pay | Admitting: Family Medicine

## 2014-01-28 ENCOUNTER — Ambulatory Visit: Payer: No Typology Code available for payment source

## 2014-02-03 ENCOUNTER — Ambulatory Visit: Payer: Self-pay | Admitting: Family Medicine

## 2014-02-27 ENCOUNTER — Ambulatory Visit (HOSPITAL_COMMUNITY)
Admission: RE | Admit: 2014-02-27 | Discharge: 2014-02-27 | Disposition: A | Discharge status: Home / Self Care | Payer: No Typology Code available for payment source | Source: Ambulatory Visit | Attending: Family Medicine | Admitting: Family Medicine

## 2014-02-27 ENCOUNTER — Encounter: Payer: Self-pay | Admitting: Family Medicine

## 2014-02-27 ENCOUNTER — Ambulatory Visit (INDEPENDENT_AMBULATORY_CARE_PROVIDER_SITE_OTHER): Payer: No Typology Code available for payment source | Admitting: Family Medicine

## 2014-02-27 VITALS — BP 150/100 | HR 85 | Ht 62.0 in | Wt 215.0 lb

## 2014-02-27 DIAGNOSIS — R0781 Pleurodynia: Secondary | ICD-10-CM

## 2014-02-27 DIAGNOSIS — R51 Headache: Secondary | ICD-10-CM

## 2014-02-27 DIAGNOSIS — I10 Essential (primary) hypertension: Secondary | ICD-10-CM

## 2014-02-27 DIAGNOSIS — R079 Chest pain, unspecified: Secondary | ICD-10-CM

## 2014-02-27 DIAGNOSIS — M47812 Spondylosis without myelopathy or radiculopathy, cervical region: Secondary | ICD-10-CM | POA: Insufficient documentation

## 2014-02-27 DIAGNOSIS — F339 Major depressive disorder, recurrent, unspecified: Secondary | ICD-10-CM

## 2014-02-27 MED ORDER — CYCLOBENZAPRINE HCL 10 MG PO TABS: 10.0000 mg | ORAL_TABLET | Freq: Three times a day (TID) | ORAL | Status: DC | PRN

## 2014-02-27 MED ORDER — VENLAFAXINE HCL ER 37.5 MG PO CP24: 37.5000 mg | ORAL_CAPSULE | Freq: Every day | ORAL | Status: DC

## 2014-02-27 MED ORDER — METHYLPREDNISOLONE ACETATE 40 MG/ML IJ SUSP
40.0000 mg | Freq: Once | INTRAMUSCULAR | Status: AC
  Administered 2014-02-27: 40 mg via INTRAMUSCULAR

## 2014-02-27 NOTE — Patient Instructions (Signed)
Nice to see you. Sorry you are not feeling well. Please try the effexor for your depression. I believe the headache is related to the muscle spasm in your neck. Please go get the x-ray of your neck at your convenience. We will call with the results.

## 2014-02-28 ENCOUNTER — Encounter: Payer: Self-pay | Admitting: *Deleted

## 2014-02-28 ENCOUNTER — Telehealth: Payer: Self-pay | Admitting: *Deleted

## 2014-02-28 DIAGNOSIS — R0781 Pleurodynia: Secondary | ICD-10-CM | POA: Insufficient documentation

## 2014-02-28 NOTE — Assessment & Plan Note (Signed)
Continues to have issues with headache likely related to muscle spasm in neck. She has no neurological deficits at this time. Given the continuance of her neck pain and spasm I got an XR of the cervical spine that revealed arthritic changes. The patient had a good response to flexeril after her last visit and I have refilled this. Additionally I have given her exercises to perform to help with her spastic muscles. She was additionally given a dose of depomedrol for her headache. She is to follow-up in 2 weeks to see if she has had improvement. Give return precautions.

## 2014-02-28 NOTE — Progress Notes (Signed)
Patient ID: Kristin [name], female   DOB: August 16, 1955, 58 y.o.   MRN: [id number]  Tommi Rumps, MD Phone: [phone]  Kristin [name] is a 58 y.o. female who presents today for f.u.  HYPERTENSION Disease Monitoring Home BP Monitoring not checking Chest pain- no    Dyspnea- no Medications Compliance-  taking.  Edema- no  Headache: states it is still present on the left side of the back of her head. Notes she get the pain in the back of her neck. She notes photophobia. Denies phonophobia. She states the muscle relaxer prescribed at her last visit helped. She has tried ibuprofen and aleve as well. These have helped minimally. She notes no neurological deficits.  Depression: she notes she has had difficulty sleeping and has been crying. She has been on effexor previously, up until about 9 months ago when she stopped taking the medication on her own. She notes recent stressors of having to move and her husband not being with her any more. She notes feeling depressed for several months. Decreased appetite. Not interested in doing anything for herself. She states she has [facility], though would prefer to see Dr Gwenlyn Saran in our clinic. No SI.  PHQ9 of 19.  Right rib pain: notes this has been tender for a while. It improves with stretching. It does not radiate. There is no shortness of breath or diaphoresis with it. It is not exertional.   Patient is a former smoker.   ROS: Per HPI   Physical Exam Filed Vitals:   02/27/14 0940  BP: 150/100  Pulse: 85     Gen: tearful Psych: mood depressed, affect depressed HEENT: PERRL,  MMM Lungs: CTABL Nl WOB Heart: RRR no MRG Neuro: CN 2-12 intact, 5/5 strength in bilateral biceps, triceps, grip, quads, hamstrings, plantar and dorsiflexion, sensation to light touch intact in bilateral UE and LE, normal gait, 2+ patellar reflexes MSK: there is tenderness to palpation in the mid right ribs at the mid axillary location, spasm noted in  left trapezius distribution with tenderness to palpation throughtout Exts: Non edematous BL  LE, warm and well perfused.   Assessment/Plan: Please see individual problem list.  # Healthcare maintenance: given information for mammogram  Tommi Rumps, MD [place] PGY-3

## 2014-02-28 NOTE — Assessment & Plan Note (Signed)
Patient has had a recurrence of her depression since coming off her medications. We are going to restart her on effexor as this has been beneficial in the past. I gave her Dr Tod Persia card to set up an appointment. At this point with recurrence of this issue she will potentially need life long medication for this issue. F/u in 2 weeks.

## 2014-02-28 NOTE — Assessment & Plan Note (Signed)
" >>  ASSESSMENT AND PLAN FOR HYPERTENSION, BENIGN SYSTEMIC WRITTEN ON 02/28/2014  8:02 PM BY Anntoinette Haefele G, MD  Slightly above goal today. Likely related to discomfort from neck pain. Will continue current medication regimen. If it is not improved at her next visit will need to add an additional agent to help control her blood pressure. "

## 2014-02-28 NOTE — Assessment & Plan Note (Signed)
Slightly above goal today. Likely related to discomfort from neck pain. Will continue current medication regimen. If it is not improved at her next visit will need to add an additional agent to help control her blood pressure.

## 2014-02-28 NOTE — Assessment & Plan Note (Signed)
Patient with tenderness of right ribs. Likely MSK in origin. Discussed use of ibuprofen for this. Advised to take with food given GERD history. F/u if not improving.

## 2014-02-28 NOTE — Telephone Encounter (Signed)
Unable to reach patient by phone.  If she calls back please inform of message from MD if not then I have already mailed a letter with results.  Jazmin Hartsell,CMA

## 2014-02-28 NOTE — Telephone Encounter (Signed)
Message copied by Valerie Roys on Fri Feb 28, 2014  2:02 PM ------      Message from: Caryl Bis, ERIC G      Created: Fri Feb 28, 2014  1:58 PM       Patients x-ray revealed signs of arthritis in her neck. This could be causing some of her pain. She can proceed with the exercises I gave her at her visit. She should also use the muscle relaxer provided to her. Please inform her of this. Thanks. ------

## 2014-03-05 ENCOUNTER — Telehealth: Payer: Self-pay | Admitting: Psychology

## 2014-03-05 NOTE — Telephone Encounter (Signed)
Kristin [name] called requesting an appointment.  Reviewed note and identified that it was a therapy referral.  I am not taking new patients right now.  She does not have insurance and is a good candidate for the [place] [employer] [facility] program.  I gave her the information to contact Apolonio Schneiders there:  [phone].  Rachel's email is:  [email or web address].  I will let Dr. Caryl Bis know.  He may need to do a referral or some type of paperwork so that she can be seen there.    Kristin [name] voiced an understanding and stated she would be calling Apolonio Schneiders this morning.

## 2014-03-20 ENCOUNTER — Encounter: Payer: Self-pay | Admitting: Family Medicine

## 2014-03-20 ENCOUNTER — Ambulatory Visit (INDEPENDENT_AMBULATORY_CARE_PROVIDER_SITE_OTHER): Payer: No Typology Code available for payment source | Admitting: Family Medicine

## 2014-03-20 VITALS — BP 132/86 | HR 98 | Temp 98.7°F | Wt 212.0 lb

## 2014-03-20 DIAGNOSIS — R51 Headache: Secondary | ICD-10-CM

## 2014-03-20 DIAGNOSIS — F339 Major depressive disorder, recurrent, unspecified: Secondary | ICD-10-CM

## 2014-03-20 DIAGNOSIS — Z23 Encounter for immunization: Secondary | ICD-10-CM

## 2014-03-20 MED ORDER — CYCLOBENZAPRINE HCL 10 MG PO TABS: 10.0000 mg | ORAL_TABLET | Freq: Three times a day (TID) | ORAL | Status: DC | PRN

## 2014-03-20 MED ORDER — AMITRIPTYLINE HCL 25 MG PO TABS: 25.0000 mg | ORAL_TABLET | Freq: Every day | ORAL | Status: DC

## 2014-03-20 NOTE — Assessment & Plan Note (Signed)
Continues to struggle with depression. Has not started on medication yet. Given combination of pain issue and depression will treat both with amitriptyline. She is to proceed with [facility] for therapy. F/u in 4 weeks.

## 2014-03-20 NOTE — Assessment & Plan Note (Addendum)
Patient with continued uncontrolled headache. No neurological abnormalities on exam. This is likely a variation of her chronic tension headaches that has been exacerbated by the muscle spasms in her neck. 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 Discussed options of medication treatment vs PT vs referral to the headache center. Patient chose medication management at this time. Will start on amitriptyline 25 mg qhs. I will see her back in 4 weeks for follow-up. Given return precautions.   Precepted with Dr Andria Frames

## 2014-03-20 NOTE — Progress Notes (Signed)
Patient ID: Kristin [name], female   DOB: [birth year], 58 y.o.   MRN: [id number]  Tommi Rumps, MD Phone: [phone]  Kristin [name] is a 58 y.o. female who presents today for f/u.  Headache: notes this is still present. She feels the headache has worsened. It feels as though someone is grabbing her head in the left posterior region. She notes the spasm in her neck improved mildly with flexeril. Her cervical spine XR revealed arthritic changes. She notes this pain has been difficult to get rid of and ibuprofen, tylenol, aleve, tramadol, and vicodin have not helped. She notes the steroid injection at her last visit did not help. She denies changes in vision and photophobia. No weakness, BM, or urinary issues.  Depression: she notes this is not improved. She has contacted [facility] and is getting set up with therapy. She did not start the effexor as she did not realize this was at the pharmacy for her. She notes thoughts that she might be better off dead when she has particularly bad headaches, though she has no plan to hurt herself and states there are too many people that depend on her so she could not hurt herself. PHQ9 18 GAD7 16  Patient is a former smoker.   ROS: Per HPI   Physical Exam Filed Vitals:   03/20/14 1025  BP: 132/86  Pulse: 98  Temp: 98.7 F (37.1 C)    Gen: Well NAD HEENT: PERRL,  MMM Lungs: CTABL Nl WOB Heart: RRR no MRG MSK: there is spasm and tenderness noted in the left cervical paraspinous muscles and in the superior portion of the trapezius Neuro: CN 2-12 intact, 5/5 strength in bilateral biceps, triceps, grip, quads, hamstrings, plantar and dorsiflexion, sensation to light touch intact in bilateral UE and LE, normal gait, 2+ patellar reflexes Psych: mood depressed, affect depressed, patient is intermittently tearful, though less so than her last visit Exts: Non edematous BL  LE, warm and well perfused.    Assessment/Plan:  Please see individual problem list.  # Healthcare maintenance: flu shot given today, advised on getting a mammogram  Tommi Rumps, MD [place] PGY-3

## 2014-03-20 NOTE — Patient Instructions (Addendum)
Nice to see you. Sorry your headache has not improved. We are going to start amitriptyline for your pain and depression. I would like to see you back in the next month to see how this is working.  If you develop weakness, numbness, thoughts of hurting yourself or others please go to the emergency room.

## 2014-03-25 ENCOUNTER — Telehealth: Payer: Self-pay | Admitting: *Deleted

## 2014-03-25 NOTE — Telephone Encounter (Signed)
Pt called stating she is having some back pain.  Pt was scheduled for same day with PCP 03/26/2014.  Pt was prescribed Flexeril 03/20/2014; told take a flexeril, use heating pad on back for short period of time; pt took Ibuprofen around 8 AM this morning.  Pt told to take Ibuprofen about 5 PM today.  No more appts available today.  Pt also told to go to urgent care if she could not wait until tomorrow.  Derl Barrow, RN

## 2014-03-26 ENCOUNTER — Ambulatory Visit (INDEPENDENT_AMBULATORY_CARE_PROVIDER_SITE_OTHER): Payer: No Typology Code available for payment source | Admitting: Family Medicine

## 2014-03-26 ENCOUNTER — Telehealth: Payer: Self-pay | Admitting: Family Medicine

## 2014-03-26 ENCOUNTER — Encounter: Payer: Self-pay | Admitting: Family Medicine

## 2014-03-26 VITALS — BP 146/94 | HR 107 | Temp 98.6°F | Ht 62.0 in | Wt 215.0 lb

## 2014-03-26 DIAGNOSIS — R079 Chest pain, unspecified: Secondary | ICD-10-CM | POA: Insufficient documentation

## 2014-03-26 DIAGNOSIS — I10 Essential (primary) hypertension: Secondary | ICD-10-CM

## 2014-03-26 DIAGNOSIS — R0789 Other chest pain: Secondary | ICD-10-CM

## 2014-03-26 DIAGNOSIS — E785 Hyperlipidemia, unspecified: Secondary | ICD-10-CM

## 2014-03-26 LAB — LIPID PANEL
CHOL/HDL RATIO: 3 ratio
CHOLESTEROL: 209 mg/dL — AB (ref 0–200)
HDL: 70 mg/dL (ref >39)
LDL Cholesterol: 127 mg/dL — ABNORMAL HIGH (ref 0–99)
TRIGLYCERIDES: 58 mg/dL (ref <150)
VLDL: 12 mg/dL (ref 0–40)

## 2014-03-26 MED ORDER — OMEPRAZOLE 40 MG PO CPDR: 40.0000 mg | DELAYED_RELEASE_CAPSULE | Freq: Every day | ORAL | Status: DC

## 2014-03-26 NOTE — Assessment & Plan Note (Addendum)
Patient with reproducible pain in constochondral region and bilateral mid ribs that is the same pain she has been having the past 3 days making this likely musculoskeletal in origin. Of note she does have risk factors (HTN, history of smoking) for cardiac disease though given description of pain, reproducibility, and exacerbated by movement and reaching with her arms makes cardiac cause unlikely. Given reflux symptoms this could be a contributing cause so will start her back on PPI. Tylenol prn and aleve prn for discomfort. Given return precautions.   Precepted with Dr Andria Frames.

## 2014-03-26 NOTE — Assessment & Plan Note (Signed)
Will check lipid panel. 

## 2014-03-26 NOTE — Telephone Encounter (Signed)
Pt called because the medication that was prescribed today Prilosec she can not afford. She needs something on the 4.00 list at [facility]. jw

## 2014-03-26 NOTE — Patient Instructions (Signed)
Nice to see you. Your pain appears to be related to your ribs and joints in your chest.  I would like you to start taking a baby aspirin daily.  We are going to check your cholesterol today and for diabetes. If you have worsening chest pain, it moves from where it is now, it comes on with exertion, you develop shortness of breath with it you need to go to the emergency room.

## 2014-03-26 NOTE — Progress Notes (Signed)
Patient ID: Kristin [name], female   DOB: Apr 13, 1956, 58 y.o.   MRN: [id number]  Tommi Rumps, MD Phone: [phone]  Kristin [name] is a 58 y.o. female who presents today for same day appointment.  Patient presents today for chest pain. She states it feels like she twisted her ribs. Started 3 days ago. It is in a band distribution from her mid chest to her bilateral mid ribs. It feels like a band is squeezing this area. She notes intermittent diaphoresis with this. No dyspnea. Not exertional. It is worsened by twisting and reaching out with her arms. She denies cardiac issues in the past. She notes her grandfather had an MI in his 54's. She does also note reflux symptoms of burning and a sour taste in the back of her mouth. She has a history of GERD though is not currently on any medication.   Patient is a former smoker.   ROS: Per HPI   Physical Exam Filed Vitals:   03/26/14 0931  BP: 146/94  Pulse: 107  Temp: 98.6 F (37 C)    Gen: Well NAD HEENT: PERRL,  MMM Lungs: CTABL Nl WOB Heart: RRR no MRG MSK: costochondral joints bilaterally and bilateral mid ribs with reproducible tenderness to palpation Exts: Non edematous BL  LE, warm and well perfused.    Assessment/Plan: Please see individual problem list.   Tommi Rumps, MD [place] PGY-3

## 2014-03-27 MED ORDER — RANITIDINE HCL 150 MG PO TABS: 150.0000 mg | ORAL_TABLET | Freq: Two times a day (BID) | ORAL | Status: DC

## 2014-03-27 NOTE — Telephone Encounter (Signed)
Attempted to call patient to advise that I sent in Zantac for her reflux. This should be on the $4 list at [facility]. I also wanted to discuss her cholesterol results. They are mildly elevated, though not to the point that she absolutely needs to be on a cholesterol medication. She needs to work on diet and exercise. If she would like we could start her on a statin for her cholesterol. If she chooses to just do diet and exercise we will repeat her cholesterol testing in 6 months to see if there has been an improvement. Please call the patient later today to inform her of this. Thanks.

## 2014-03-27 NOTE — Telephone Encounter (Signed)
No message and no machine.  Will mail letter. Alleene Stoy, Salome Spotted

## 2014-04-15 ENCOUNTER — Ambulatory Visit: Payer: No Typology Code available for payment source | Admitting: Family Medicine

## 2014-04-22 ENCOUNTER — Encounter: Payer: Self-pay | Admitting: Family Medicine

## 2014-04-22 ENCOUNTER — Ambulatory Visit (INDEPENDENT_AMBULATORY_CARE_PROVIDER_SITE_OTHER): Payer: No Typology Code available for payment source | Admitting: Family Medicine

## 2014-04-22 VITALS — BP 162/102 | HR 106 | Temp 98.3°F | Wt 220.0 lb

## 2014-04-22 DIAGNOSIS — F331 Major depressive disorder, recurrent, moderate: Secondary | ICD-10-CM

## 2014-04-22 DIAGNOSIS — I10 Essential (primary) hypertension: Secondary | ICD-10-CM

## 2014-04-22 DIAGNOSIS — G44209 Tension-type headache, unspecified, not intractable: Secondary | ICD-10-CM

## 2014-04-22 LAB — BASIC METABOLIC PANEL
BUN: 15 mg/dL (ref 6–23)
CO2: 30 mEq/L (ref 19–32)
CREATININE: 0.93 mg/dL (ref 0.50–1.10)
Calcium: 9.7 mg/dL (ref 8.4–10.5)
Chloride: 99 mEq/L (ref 96–112)
Glucose, Bld: 106 mg/dL — ABNORMAL HIGH (ref 70–99)
POTASSIUM: 3.7 meq/L (ref 3.5–5.3)
Sodium: 136 mEq/L (ref 135–145)

## 2014-04-22 MED ORDER — HYDROCHLOROTHIAZIDE 25 MG PO TABS: 25.0000 mg | ORAL_TABLET | Freq: Every day | ORAL | Status: DC

## 2014-04-22 MED ORDER — AMITRIPTYLINE HCL 50 MG PO TABS: 50.0000 mg | ORAL_TABLET | Freq: Every day | ORAL | Status: DC

## 2014-04-22 NOTE — Assessment & Plan Note (Signed)
" >>  ASSESSMENT AND PLAN FOR HYPERTENSION, BENIGN SYSTEMIC WRITTEN ON 04/22/2014  9:16 AM BY Aragon Scarantino G, MD  Not at goal. Difficulty with cost. Will change to HCTZ 25 mg daily. BMET today. Nurse visit for blood pressure check in one week. F/u with me in one month.  "

## 2014-04-22 NOTE — Assessment & Plan Note (Addendum)
Unchanged from last visit. No neurological deficits. Amitriptyline just increased so will see how she responds to this. She is to try ibuprofen 600 mg q6 hr prn. Advised on food with this and to stop if this causes stomach upset. She is to only use the flexeril if there is benefit. I discussed PT as an option given her neck pain and spasm and she decided to hold off on this at this time. F/u in one month.

## 2014-04-22 NOTE — Assessment & Plan Note (Addendum)
Not at goal. Difficulty with cost. Will change to HCTZ 25 mg daily. BMET today. Nurse visit for blood pressure check in one week. F/u with me in one month.

## 2014-04-22 NOTE — Patient Instructions (Addendum)
Nice to see you. I am glad your depression is improved. Please continue to follow with the psychologist.  We will change your blood pressure medication to HCTZ. You can take ibuprofen 600 mg every 6 hours as needed for your headaches. Take this with food. If your stomach becomes upset please stop taking this.  Follow-up with me in 1 month.

## 2014-04-22 NOTE — Assessment & Plan Note (Signed)
Improved per patient. She will continue amitriptyline and to follow with psychology. F/u in 1 month.

## 2014-04-22 NOTE — Progress Notes (Signed)
Patient ID: MARIANITA BOTKIN, female   DOB: Sep 12, 1955, 58 y.o.   MRN: [id number]  Tommi Rumps, MD Phone: [phone]  Kristin [name] is a 58 y.o. female who presents today for f/u.  HYPERTENSION Disease Monitoring Home BP Monitoring not checking Chest pain- no    Dyspnea- no Medications Compliance-  Taking, though amlodipine is too expensive.  Edema- no  Headache: notes this is unchanged. Still on the left side of her head and left neck. She states the amitriptyline did not help much with the headache. She notes she has been taking tylenol q4 hrs. Taking aleve. No ibuprofen at this time. She states doing exercises helped some. Ice and heat help some. Denies weakness and numbness. Some photophobia.   Depression: patient notes this is improved. She has been to therapy once and has seen a psychiatrist. They increased her amitriptyline to 50 mg daily last week. She notes this has helped and therapy has helped. She denies SI. She has been able to sleep better and is not waking up as frequently.    Patient is a former smoker.   ROS: Per HPI   Physical Exam Filed Vitals:   04/22/14 0840  BP: 156/101  Pulse: 106  Temp: 98.3 F (36.8 C)    Gen: Well NAD HEENT: PERRL,  MMM Lungs: CTABL Nl WOB Heart: RRR no MRG Neuro: CN 2-12 intact, 5/5 strength in bilateral biceps, triceps, grip, quads, hamstrings, plantar and dorsiflexion, sensation to light touch intact in bilateral UE and LE, normal gait, 2+ patellar reflexes Exts: Non edematous BL  LE, warm and well perfused.    Assessment/Plan: Please see individual problem list.  # Healthcare maintenance: previously given mammogram info, reminded to schedule this  Tommi Rumps, MD [place] PGY-3

## 2014-04-23 ENCOUNTER — Encounter: Payer: Self-pay | Admitting: Family Medicine

## 2014-04-29 ENCOUNTER — Ambulatory Visit (INDEPENDENT_AMBULATORY_CARE_PROVIDER_SITE_OTHER): Payer: No Typology Code available for payment source | Admitting: *Deleted

## 2014-04-29 VITALS — BP 160/90 | HR 109

## 2014-04-29 DIAGNOSIS — I10 Essential (primary) hypertension: Secondary | ICD-10-CM

## 2014-04-29 DIAGNOSIS — Z0130 Encounter for examination of blood pressure without abnormal findings: Secondary | ICD-10-CM

## 2014-04-29 DIAGNOSIS — Z136 Encounter for screening for cardiovascular disorders: Secondary | ICD-10-CM

## 2014-04-29 NOTE — Progress Notes (Signed)
   Pt in nurse clinic for blood pressure check.  BP  160/90 manually, heart rate 109.  Pt stated she is having a headache; taking Ibuprofen for minium relief.  Pt denies any visual changes, SOB, dizziness or chest pain.  Pt is taking medications as ordered.  Will forward to PCP. Derl Barrow, RN

## 2014-05-01 ENCOUNTER — Other Ambulatory Visit: Payer: Self-pay | Admitting: Family Medicine

## 2014-05-01 MED ORDER — LISINOPRIL 10 MG PO TABS: 10.0000 mg | ORAL_TABLET | Freq: Every day | ORAL | Status: DC

## 2014-05-01 NOTE — Progress Notes (Signed)
Pt aware of new medication. Appt for Lab 05/08/2014 and with you 06/02/2014.

## 2014-05-05 ENCOUNTER — Ambulatory Visit: Payer: No Typology Code available for payment source | Admitting: Family Medicine

## 2014-05-08 ENCOUNTER — Other Ambulatory Visit: Payer: No Typology Code available for payment source

## 2014-05-08 DIAGNOSIS — I10 Essential (primary) hypertension: Secondary | ICD-10-CM

## 2014-05-08 LAB — BASIC METABOLIC PANEL
BUN: 12 mg/dL (ref 6–23)
CO2: 32 mEq/L (ref 19–32)
Calcium: 9.8 mg/dL (ref 8.4–10.5)
Chloride: 99 mEq/L (ref 96–112)
Creat: 0.95 mg/dL (ref 0.50–1.10)
Glucose, Bld: 80 mg/dL (ref 70–99)
Potassium: 3.9 mEq/L (ref 3.5–5.3)
SODIUM: 137 meq/L (ref 135–145)

## 2014-05-08 NOTE — Progress Notes (Signed)
DREW BMP

## 2014-05-08 NOTE — Progress Notes (Signed)
Bmp done today Daud Cayer 

## 2014-05-14 ENCOUNTER — Telehealth: Payer: Self-pay | Admitting: Family Medicine

## 2014-05-14 DIAGNOSIS — I10 Essential (primary) hypertension: Secondary | ICD-10-CM

## 2014-05-14 MED ORDER — LOSARTAN POTASSIUM 50 MG PO TABS: 50.0000 mg | ORAL_TABLET | Freq: Every day | ORAL | Status: DC

## 2014-05-14 NOTE — Telephone Encounter (Signed)
Pt called because she said that she is having a reaction to her BP medication. She said that she is coughing a lot. Please call to discuss. jw

## 2014-05-14 NOTE — Telephone Encounter (Signed)
Will forward to MD. Jazmin Hartsell,CMA  

## 2014-05-14 NOTE — Telephone Encounter (Signed)
Called patient to discus her cough. She thinks this is related to her lisinopril. She additionally reports congestion, post-nasal drip, and cough. No fevers. Has tried OTC cough medications. Advised I could change her medication. Will change to losartan. Advised that if she continued to have these symptoms tomorrow morning that she should make an appointment for follow-up.

## 2014-05-16 ENCOUNTER — Telehealth: Payer: Self-pay | Admitting: Family Medicine

## 2014-05-16 DIAGNOSIS — I10 Essential (primary) hypertension: Secondary | ICD-10-CM

## 2014-05-16 MED ORDER — CARVEDILOL 12.5 MG PO TABS: 12.5000 mg | ORAL_TABLET | Freq: Two times a day (BID) | ORAL | Status: DC

## 2014-05-16 NOTE — Telephone Encounter (Signed)
Covering for Dr Caryl Bis. Please let patient know there are no antihypertensives in the same class as losartan that would be cheaper (called her pharmacy to inquire). The only other medications on the $4 list are ACEs, beta blockers, clonidine and hydralazine. Will send in carvedilol 12.5mg  BID and she can purchase 1 month worth and follow up with PCP. She has no asthma/COPD or bradycardia, and in fact her HR is a little on the higher side last few visits so this may be helpful for that too.    Hilton Sinclair, MD PGY-3, [place]

## 2014-05-16 NOTE — Telephone Encounter (Signed)
Pt was prescribed losartan on 05/14/14. She states that the medication is over $100. She requesting something be called in that is one the $4 list at [facility]. Please call patient once meds have been sent.

## 2014-05-16 NOTE — Telephone Encounter (Signed)
LMOVM for pt to call back.  Please inform that there is nothing that is in the same class as losartan but the MD sent in a one month of another medication, but it is VERY important for her to make appt to see Caryl Bis to see if this medication helps. Dymir Neeson, Salome Spotted

## 2014-05-16 NOTE — Addendum Note (Signed)
Addended by: Conni Slipper T on: 05/16/2014 09:55 AM   Modules accepted: Orders, Medications

## 2014-05-16 NOTE — Telephone Encounter (Signed)
Patient informed. 

## 2014-05-26 ENCOUNTER — Encounter: Payer: Self-pay | Admitting: Family Medicine

## 2014-05-26 ENCOUNTER — Ambulatory Visit (INDEPENDENT_AMBULATORY_CARE_PROVIDER_SITE_OTHER): Payer: No Typology Code available for payment source | Admitting: Family Medicine

## 2014-05-26 VITALS — BP 158/91 | HR 97 | Temp 98.0°F | Wt 221.0 lb

## 2014-05-26 DIAGNOSIS — R05 Cough: Secondary | ICD-10-CM | POA: Insufficient documentation

## 2014-05-26 DIAGNOSIS — R059 Cough, unspecified: Secondary | ICD-10-CM

## 2014-05-26 MED ORDER — OMEPRAZOLE 20 MG PO CPDR: 20.0000 mg | DELAYED_RELEASE_CAPSULE | Freq: Every day | ORAL | Status: DC

## 2014-05-26 MED ORDER — BENZONATATE 100 MG PO CAPS: 100.0000 mg | ORAL_CAPSULE | Freq: Two times a day (BID) | ORAL | Status: DC | PRN

## 2014-05-26 NOTE — Progress Notes (Signed)
   Subjective:    Patient ID: Kristin [name], female    DOB: [birth year], 58 y.o.   MRN: [id number]  HPI  COUGH  Has been coughing for 20 or so days. Cough is: mostly dry sometimes coughs so hard she vomis Sputum production: minimal Medications tried: nyquill, delsum Taking blood pressure medications: was taking losartan which was stopped 2 weeks ago due to cough but did not change the cough  Symptoms Runny nose: mild Mucous in back of throat: does feel this at night when she coughs badly Throat burning or reflux: has reflux takes raniditine has been worse lately Wheezing or asthma: no Fever: no Chest Pain: no Shortness of breath: no Leg swelling: no Hemoptysis: no Weight loss: no  No TB exposure Stopped smoking 1 year ago  ROS see HPI Smoking Status noted  Chief Complaint noted Review of Symptoms - see HPI PMH - Smoking status noted.   Vital Signs reviewed   Review of Systems     Objective:   Physical Exam  Alert does cough frequently and violently Lungs:  Normal respiratory effort, chest expands symmetrically. Lungs are clear to auscultation, no crackles or wheezes. Heart - Regular rate and rhythm.  No murmurs, gallops or rubs.    Mouth - no lesions, mucous membranes are moist, no decaying teeth  Neck:  No deformities, thyromegaly, masses, or tenderness noted.   Supple with full range of motion without pain. Skin:  Intact without suspicious lesions or rashes Extremities:  No cyanosis, edema, or deformity noted with good range of motion of all major joints.         Assessment & Plan:

## 2014-05-26 NOTE — Patient Instructions (Signed)
Good to see you today!  Thanks for coming in.  If you are not better in 2 weeks or if you have fever or shortness of breath or change in sputum or leg swelling then come back  Use the tesalon perles twice daily for cough  For Reflux Stop the ranitidine and start teh omeprazole 20 mg a day for reflux  For nasal congestion  Try otc Afrin or neosynephrine nasal spray  Try otc loratadine antishistamine 10 mg once a day

## 2014-05-26 NOTE — Assessment & Plan Note (Signed)
For last 3 weeks.  No change with stopping her ARB.  No red flags for bacterial infection or tumor.  Likely postviral or Reflux or Post nasal drip.  Will treat for these.  If worsening or no better in 3-4 weeks would check chest xray.

## 2014-05-30 ENCOUNTER — Telehealth: Payer: Self-pay | Admitting: Family Medicine

## 2014-05-30 NOTE — Telephone Encounter (Signed)
After hours line  Pt with continued cough despite several OTC meds. States he has tried Mining engineer, delsym, tylenol, and Alka seltzer, all without relief.  SHe states he r cough has continued and she has some post tussive emesis. She states that she was wondering if I could call in an antibiotic.   I explained we do not prescrib antibiotics over the phone. I advised urgent care today if she cannot wait until her already scheduled appt on Monday.   Laroy Apple, MD [place] Resident, PGY-3 05/30/2014, 12:10 PM

## 2014-05-30 NOTE — Telephone Encounter (Signed)
After hours line, pt did not answer call back so I left a vm.   Laroy Apple, MD [place] Resident, PGY-3 05/30/2014, 11:21 AM

## 2014-06-02 ENCOUNTER — Encounter: Payer: Self-pay | Admitting: Family Medicine

## 2014-06-02 ENCOUNTER — Ambulatory Visit (HOSPITAL_COMMUNITY)
Admission: RE | Admit: 2014-06-02 | Discharge: 2014-06-02 | Disposition: A | Discharge status: Home / Self Care | Payer: No Typology Code available for payment source | Source: Ambulatory Visit | Attending: Family Medicine | Admitting: Family Medicine

## 2014-06-02 ENCOUNTER — Ambulatory Visit (INDEPENDENT_AMBULATORY_CARE_PROVIDER_SITE_OTHER): Payer: No Typology Code available for payment source | Admitting: Family Medicine

## 2014-06-02 ENCOUNTER — Telehealth: Payer: Self-pay | Admitting: Family Medicine

## 2014-06-02 VITALS — BP 124/86 | HR 103 | Temp 98.3°F | Ht 62.0 in | Wt 222.0 lb

## 2014-06-02 DIAGNOSIS — I10 Essential (primary) hypertension: Secondary | ICD-10-CM

## 2014-06-02 DIAGNOSIS — R059 Cough, unspecified: Secondary | ICD-10-CM

## 2014-06-02 DIAGNOSIS — J00 Acute nasopharyngitis [common cold]: Secondary | ICD-10-CM | POA: Insufficient documentation

## 2014-06-02 DIAGNOSIS — R05 Cough: Secondary | ICD-10-CM

## 2014-06-02 DIAGNOSIS — G44209 Tension-type headache, unspecified, not intractable: Secondary | ICD-10-CM

## 2014-06-02 DIAGNOSIS — Z9049 Acquired absence of other specified parts of digestive tract: Secondary | ICD-10-CM | POA: Insufficient documentation

## 2014-06-02 MED ORDER — CHLORPHENIRAMINE-CODEINE 2-9 MG/5ML PO LIQD
10.0000 mL | Freq: Two times a day (BID) | ORAL | Status: DC | PRN
Start: 2014-06-02 — End: 2014-06-02

## 2014-06-02 MED ORDER — OMEPRAZOLE 20 MG PO CPDR: 20.0000 mg | DELAYED_RELEASE_CAPSULE | Freq: Every day | ORAL | Status: DC

## 2014-06-02 MED ORDER — CODEINE SULFATE 15 MG PO TABS: 15.0000 mg | ORAL_TABLET | Freq: Four times a day (QID) | ORAL | Status: DC | PRN

## 2014-06-02 MED ORDER — LORATADINE 10 MG PO TABS: 10.0000 mg | ORAL_TABLET | Freq: Every day | ORAL | Status: DC

## 2014-06-02 NOTE — Telephone Encounter (Signed)
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 Cough syrup called in earlier today is not carried by her pharmacy. Will call in codeine 15 mg tablets to be taken every 6 hours prn for cough. I informed the patient of this and her negative CXR result as well.

## 2014-06-02 NOTE — Telephone Encounter (Signed)
Pt called because the cough syrup that prescribed today is not carried by the pharmacy and she would like something else called in. jw

## 2014-06-02 NOTE — Patient Instructions (Addendum)
Nice to see you. Please stop taking the ibuprofen. This is likely upsetting your stomach and making your reflux worse. Please start taking the omeprazole. We will get a chest x-ray. Start taking the loratidine as well. If you develop shortness of breath, chest pain, or fever please seek medical attention.

## 2014-06-03 ENCOUNTER — Telehealth: Payer: Self-pay | Admitting: Family Medicine

## 2014-06-03 NOTE — Telephone Encounter (Signed)
Pt called because [facility] does not carry the Codeine 15 mg but if we call the pharmacy they can recommend something that the patient can take and they carry. jw

## 2014-06-03 NOTE — Telephone Encounter (Signed)
Will forward to MD to see if he can suggest something else.  Also to see if he wants just a plain cough suppressant or something else in it.  Matalie Romberger,CMA

## 2014-06-03 NOTE — Progress Notes (Signed)
Patient ID: IRELYN PERFECTO, female   DOB: [birth year], 58 y.o.   MRN: [id number]  Tommi Rumps, MD Phone: [phone]  CORTANA VANDERFORD is a 58 y.o. female who presents today for f/u.  COUGH  Has been coughing for 1 month. Cough is: not improved, though does not have as much post-tussive emesis Medications tried: nyquil, delsym, tylenol cold, alkaseltzer, tessalon, none helping Taking blood pressure medications: ARB d/c'd previously though no improvement in cough  Symptoms Runny nose: no Mucous in back of throat: yes Throat burning or reflux: yes Wheezing or asthma: no Fever: no Chest Pain: yes, see below Shortness of breath: no Leg swelling: no Hemoptysis: no Weight loss: no  She notes our taste in back of her mouth. Has been taking ibuprofen q4 hrs for several months. Did not start the prilosec as prescribed by Dr Erin Hearing at last visit. Started neosynephrine. Did not start loratadine.  Chest pain: had ne episode last night. Notes a sharp pain in the center of her chest for about one minute that occurred with a coughing episode. No radiation, diaphoresis, dyspnea, or edema with this. States it felt like a rib pain.  HYPERTENSION Disease Monitoring Home BP Monitoring not checking Chest pain- yes, see above     Dyspnea- no Medications Compliance-  Taking coreg and HCTZ.  Edema- no  Headache: notes this is unchanged. Has not worsened. Is in the left neck and left side of her head. Has been taking ibuprofen q4 hrs for this with minimal relief. Amitriptyline has not helped. Muscle relaxer previously helped, though today she states it did not help. Exercises for her neck have been beneficial. No numbness or weakness. Mild photophobia with HA.    Patient is a former smoker.   ROS: Per HPI   Physical Exam Filed Vitals:   06/02/14 0957  BP: 124/86  Pulse: 103  Temp: 98.3 F (36.8 C)    Gen: Well NAD HEENT: PERRL,  MMM, bilateral TMs normal, there is OP erythema  noted, no tonsillar exudates Lungs: CTABL Nl WOB Heart: RRR no MRG Chest wall: there is tenderness over costochondral joints Exts: Non edematous BL  LE, warm and well perfused.  Neuro: CN 2-12 intact, 5/5 strength in bilateral biceps, triceps, grip, quads, hamstrings, plantar and dorsiflexion, sensation to light touch intact in bilateral UE and LE, normal gait, 2+ patellar reflexes   Assessment/Plan: Please see individual problem list.  Tommi Rumps, MD [place] PGY-3

## 2014-06-03 NOTE — Assessment & Plan Note (Signed)
" >>  ASSESSMENT AND PLAN FOR HYPERTENSION, BENIGN SYSTEMIC WRITTEN ON 06/03/2014 12:57 PM BY Kenzie Thoreson G, MD  At goal. Continue current regimen.  "

## 2014-06-03 NOTE — Assessment & Plan Note (Signed)
Continues to have headache. No focal neuro deficits. 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 Likely exacerbated by persistent coughing and will likely improve if cough comes under control. Advised to stop ibuprofen. Will continue amitriptyline. Focus on controlling cough prior to further interventions specifically for HA. Consider PT for neck if continues.

## 2014-06-03 NOTE — Telephone Encounter (Signed)
Voice message from Pharmacist at [facility] stating they do not carry plain codeine.  Please give the Pharmacy a call at [phone]. Derl Barrow, RN

## 2014-06-03 NOTE — Assessment & Plan Note (Signed)
At goal. Continue current regimen. 

## 2014-06-03 NOTE — Telephone Encounter (Signed)
They can provide Tessalon Pearles(can be called in ) or hydrocondone-homatropine (cant be called in)

## 2014-06-03 NOTE — Telephone Encounter (Signed)
Attempted to call pharmacy. There was no answer. If you could call them back this afternoon to get a list of medications they have in stock so I can choose a medication for the patient. I would like just a cough suppressant. The patient is allergic to guaifenesin so can't have medications with this in it. Thanks.

## 2014-06-03 NOTE — Assessment & Plan Note (Addendum)
Continues with cough. No red flags on history of exam. Given prolonged nature CXR obtained with no abnormalities noted. Has not started omeprazole or loratidine to treat GERD and allergic rhinitis components respectively. These prescriptions were both sent to pharmacy and patient advised to start these. Advised to D/c ibuprofen. Can use tylenol for pain. Most likely related to reflux or post nasal drip. Cough suppressant prescription given.

## 2014-06-04 MED ORDER — HYDROCODONE-HOMATROPINE 5-1.5 MG/5ML PO SYRP: 5.0000 mL | ORAL_SOLUTION | Freq: Four times a day (QID) | ORAL | Status: DC | PRN

## 2014-06-04 NOTE — Telephone Encounter (Signed)
Pt informed, will come by now. [place], Salome Spotted

## 2014-06-04 NOTE — Telephone Encounter (Signed)
Prescription for hydrocodone-homatropin left up front for pick up. Please inform the patient. Thanks.

## 2014-07-04 DIAGNOSIS — J189 Pneumonia, unspecified organism: Secondary | ICD-10-CM

## 2014-07-04 HISTORY — DX: Pneumonia, unspecified organism: J18.9

## 2014-07-18 ENCOUNTER — Emergency Department (HOSPITAL_COMMUNITY)
Admission: EM | Admit: 2014-07-18 | Discharge: 2014-07-18 | Disposition: A | Discharge status: Home / Self Care | Payer: No Typology Code available for payment source | Attending: Emergency Medicine | Admitting: Emergency Medicine

## 2014-07-18 ENCOUNTER — Encounter (HOSPITAL_COMMUNITY): Payer: Self-pay | Admitting: Emergency Medicine

## 2014-07-18 DIAGNOSIS — I10 Essential (primary) hypertension: Secondary | ICD-10-CM | POA: Insufficient documentation

## 2014-07-18 DIAGNOSIS — R1084 Generalized abdominal pain: Secondary | ICD-10-CM

## 2014-07-18 DIAGNOSIS — Z9851 Tubal ligation status: Secondary | ICD-10-CM | POA: Insufficient documentation

## 2014-07-18 DIAGNOSIS — G44229 Chronic tension-type headache, not intractable: Secondary | ICD-10-CM | POA: Insufficient documentation

## 2014-07-18 DIAGNOSIS — Z87448 Personal history of other diseases of urinary system: Secondary | ICD-10-CM | POA: Insufficient documentation

## 2014-07-18 DIAGNOSIS — F329 Major depressive disorder, single episode, unspecified: Secondary | ICD-10-CM | POA: Insufficient documentation

## 2014-07-18 DIAGNOSIS — Z79899 Other long term (current) drug therapy: Secondary | ICD-10-CM | POA: Insufficient documentation

## 2014-07-18 DIAGNOSIS — K219 Gastro-esophageal reflux disease without esophagitis: Secondary | ICD-10-CM | POA: Insufficient documentation

## 2014-07-18 DIAGNOSIS — G8929 Other chronic pain: Secondary | ICD-10-CM | POA: Insufficient documentation

## 2014-07-18 DIAGNOSIS — E860 Dehydration: Secondary | ICD-10-CM | POA: Insufficient documentation

## 2014-07-18 DIAGNOSIS — A084 Viral intestinal infection, unspecified: Secondary | ICD-10-CM | POA: Insufficient documentation

## 2014-07-18 DIAGNOSIS — R112 Nausea with vomiting, unspecified: Secondary | ICD-10-CM

## 2014-07-18 DIAGNOSIS — Z87891 Personal history of nicotine dependence: Secondary | ICD-10-CM | POA: Insufficient documentation

## 2014-07-18 DIAGNOSIS — R000 Tachycardia, unspecified: Secondary | ICD-10-CM | POA: Insufficient documentation

## 2014-07-18 DIAGNOSIS — E669 Obesity, unspecified: Secondary | ICD-10-CM | POA: Insufficient documentation

## 2014-07-18 LAB — COMPREHENSIVE METABOLIC PANEL
ALT: 17 U/L (ref 0–35)
AST: 23 U/L (ref 0–37)
Albumin: 4.3 g/dL (ref 3.5–5.2)
Alkaline Phosphatase: 107 U/L (ref 39–117)
Anion gap: 10 (ref 5–15)
BUN: 12 mg/dL (ref 6–23)
CALCIUM: 9.8 mg/dL (ref 8.4–10.5)
CO2: 30 mmol/L (ref 19–32)
Chloride: 98 mEq/L (ref 96–112)
Creatinine, Ser: 1.06 mg/dL (ref 0.50–1.10)
GFR, EST AFRICAN AMERICAN: 66 mL/min — AB (ref >90)
GFR, EST NON AFRICAN AMERICAN: 57 mL/min — AB (ref >90)
GLUCOSE: 134 mg/dL — AB (ref 70–99)
POTASSIUM: 3.6 mmol/L (ref 3.5–5.1)
SODIUM: 138 mmol/L (ref 135–145)
TOTAL PROTEIN: 7.8 g/dL (ref 6.0–8.3)
Total Bilirubin: 0.2 mg/dL — ABNORMAL LOW (ref 0.3–1.2)

## 2014-07-18 LAB — CBC WITH DIFFERENTIAL/PLATELET
BASOS ABS: 0 10*3/uL (ref 0.0–0.1)
Basophils Relative: 0 % (ref 0–1)
EOS ABS: 0.1 10*3/uL (ref 0.0–0.7)
Eosinophils Relative: 2 % (ref 0–5)
HEMATOCRIT: 37.9 % (ref 36.0–46.0)
HEMOGLOBIN: 12.6 g/dL (ref 12.0–15.0)
LYMPHS ABS: 0.6 10*3/uL — AB (ref 0.7–4.0)
Lymphocytes Relative: 11 % — ABNORMAL LOW (ref 12–46)
MCH: 30 pg (ref 26.0–34.0)
MCHC: 33.2 g/dL (ref 30.0–36.0)
MCV: 90.2 fL (ref 78.0–100.0)
MONO ABS: 0.2 10*3/uL (ref 0.1–1.0)
Monocytes Relative: 4 % (ref 3–12)
NEUTROS ABS: 4.6 10*3/uL (ref 1.7–7.7)
NEUTROS PCT: 83 % — AB (ref 43–77)
PLATELETS: 280 10*3/uL (ref 150–400)
RBC: 4.2 MIL/uL (ref 3.87–5.11)
RDW: 12.7 % (ref 11.5–15.5)
WBC: 5.6 10*3/uL (ref 4.0–10.5)

## 2014-07-18 LAB — URINALYSIS, ROUTINE W REFLEX MICROSCOPIC
Bilirubin Urine: NEGATIVE
Glucose, UA: NEGATIVE mg/dL
HGB URINE DIPSTICK: NEGATIVE
Ketones, ur: NEGATIVE mg/dL
NITRITE: NEGATIVE
PH: 7 (ref 5.0–8.0)
PROTEIN: NEGATIVE mg/dL
Specific Gravity, Urine: 1.02 (ref 1.005–1.030)
UROBILINOGEN UA: 1 mg/dL (ref 0.0–1.0)

## 2014-07-18 LAB — URINE MICROSCOPIC-ADD ON

## 2014-07-18 LAB — I-STAT TROPONIN, ED: Troponin i, poc: 0 ng/mL (ref 0.00–0.08)

## 2014-07-18 LAB — LIPASE, BLOOD: Lipase: 36 U/L (ref 11–59)

## 2014-07-18 MED ORDER — PROMETHAZINE HCL 12.5 MG PO TABS: 25.0000 mg | ORAL_TABLET | Freq: Four times a day (QID) | ORAL | Status: DC | PRN

## 2014-07-18 MED ORDER — ONDANSETRON HCL 4 MG/2ML IJ SOLN
4.0000 mg | Freq: Once | INTRAMUSCULAR | Status: AC
  Administered 2014-07-18: 4 mg via INTRAVENOUS
  Filled 2014-07-18: qty 2

## 2014-07-18 MED ORDER — MORPHINE SULFATE 4 MG/ML IJ SOLN
4.0000 mg | Freq: Once | INTRAMUSCULAR | Status: AC
  Administered 2014-07-18: 4 mg via INTRAVENOUS
  Filled 2014-07-18: qty 1

## 2014-07-18 MED ORDER — NAPROXEN 500 MG PO TABS: 500.0000 mg | ORAL_TABLET | Freq: Two times a day (BID) | ORAL | Status: DC | PRN

## 2014-07-18 MED ORDER — KETOROLAC TROMETHAMINE 30 MG/ML IJ SOLN
30.0000 mg | Freq: Once | INTRAMUSCULAR | Status: AC
  Administered 2014-07-18: 30 mg via INTRAVENOUS
  Filled 2014-07-18: qty 1

## 2014-07-18 MED ORDER — HYDROCODONE-ACETAMINOPHEN 5-325 MG PO TABS: 1.0000 | ORAL_TABLET | Freq: Four times a day (QID) | ORAL | Status: DC | PRN

## 2014-07-18 MED ORDER — SODIUM CHLORIDE 0.9 % IV BOLUS (SEPSIS)
1000.0000 mL | Freq: Once | INTRAVENOUS | Status: AC
  Administered 2014-07-18: 1000 mL via INTRAVENOUS

## 2014-07-18 MED ORDER — PREDNISONE 20 MG PO TABS
60.0000 mg | ORAL_TABLET | Freq: Once | ORAL | Status: AC
  Administered 2014-07-18: 60 mg via ORAL
  Filled 2014-07-18: qty 3

## 2014-07-18 MED ORDER — FENTANYL CITRATE 0.05 MG/ML IJ SOLN
50.0000 ug | Freq: Once | INTRAMUSCULAR | Status: AC
  Administered 2014-07-18: 50 ug via INTRAVENOUS
  Filled 2014-07-18: qty 2

## 2014-07-18 MED ORDER — DIPHENHYDRAMINE HCL 50 MG/ML IJ SOLN
25.0000 mg | Freq: Once | INTRAMUSCULAR | Status: AC
  Administered 2014-07-18: 25 mg via INTRAVENOUS
  Filled 2014-07-18: qty 1

## 2014-07-18 MED ORDER — ONDANSETRON 4 MG PO TBDP
8.0000 mg | ORAL_TABLET | Freq: Once | ORAL | Status: AC
Start: 2014-07-18 — End: 2014-07-18
  Administered 2014-07-18: 8 mg via ORAL
  Filled 2014-07-18: qty 2

## 2014-07-18 NOTE — ED Provider Notes (Signed)
CSN: [id number]     Arrival date & time 07/18/14  0243 History   First MD Initiated Contact with Patient 07/18/14 [phone]     Chief Complaint  Patient presents with  . Abdominal Pain    The patient said she has been nauseous and vomiting since 2330hrs.  She also says her abdomen has been hurting but it is not hurting that bad now.  . Nausea  . Emesis     (Consider location/radiation/quality/duration/timing/severity/associated sxs/prior Treatment) HPI Comments: Kristin [name] is a 59 y.o. female with a PMHx of chronic headaches, GERD, chronic back pain, obesity, HTN, HLD, seizures, depression, and somatization disorder, who presents to the ED with complaints of abdominal pain, nausea, and vomiting that began last night around 11:30 PM. She states that her grandchildren come down with similar symptoms and believes she caught a "stomach virus" from them. She reports her abdominal pain is 4/10 crampy generalized constant waxing and waning pain which is nonradiating, with no known aggravating factors, and unrelieved with very lax. She has had 3 episodes of nonbilious nonbloody emesis consisting of stomach contents. She also reports generalized myalgias and a mild headache which she reports is similar to her chronic headaches. She denies any fevers, chills, chest pain, shortness of breath, vision changes, URI symptoms, diarrhea, constipation, obstipation, melena, hematochezia, hematemesis, dysuria, hematuria, vaginal bleeding or discharge, numbness, tingling, weakness, rashes, dizziness, lightheadedness, recent travel, EtOH use, NSAID use, or recent antibiotics. She was given zofran in triage which she states has helped tremendously with her nausea.  Patient is a 59 y.o. female presenting with abdominal pain and vomiting. The history is provided by the patient. No language interpreter was used.  Abdominal Pain Pain location:  Generalized Pain quality: cramping   Pain radiates to:  Does not radiate Pain  severity:  Mild (4/10) Onset quality:  Gradual Duration:  6 hours Timing:  Constant Progression:  Waxing and waning Chronicity:  New Context: sick contacts   Relieved by:  Nothing Worsened by:  Nothing tried Ineffective treatments:  OTC medications (miralax) Associated symptoms: cough (chronic ongoing unchanged), nausea and vomiting   Associated symptoms: no belching, no chest pain, no chills, no constipation, no diarrhea, no dysuria, no fever, no flatus, no hematemesis, no hematochezia, no hematuria, no melena, no shortness of breath, no sore throat, no vaginal bleeding and no vaginal discharge   Emesis Severity:  Mild Duration:  6 hours Timing:  Intermittent Number of daily episodes:  3 Quality:  Stomach contents Progression:  Unchanged Chronicity:  New Recent urination:  Decreased Context: not post-tussive and not self-induced   Relieved by:  None tried Worsened by:  Nothing tried Ineffective treatments:  None tried Associated symptoms: abdominal pain, headaches and myalgias (generalized)   Associated symptoms: no arthralgias, no chills, no diarrhea, no fever, no sore throat and no URI   Risk factors: sick contacts     Past Medical History  Diagnosis Date  . Back pain, chronic   . GERD (gastroesophageal reflux disease)   . Obesity   . Tobacco abuse   . Hypertension   . Hyperlipidemia   . Seizures   . Depression   . Somatization disorder   . Renal sclerosis, unspecified     only has 1 kidney   Past Surgical History  Procedure Laterality Date  . Tubal ligation    . Abdominal hysterectomy    . Cholecystectomy     History reviewed. No pertinent family history. History  Substance Use Topics  .  Smoking status: Former Smoker -- 0.25 packs/day for .5 years    Types: Cigarettes    Quit date: 01/27/2014  . Smokeless tobacco: Not on file  . Alcohol Use: No   OB History    No data available     Review of Systems  Constitutional: Negative for fever and chills.   HENT: Negative for rhinorrhea and sore throat.   Respiratory: Positive for cough (chronic ongoing unchanged). Negative for shortness of breath.   Cardiovascular: Negative for chest pain.  Gastrointestinal: Positive for nausea, vomiting and abdominal pain. Negative for diarrhea, constipation, blood in stool, melena, hematochezia, abdominal distention, flatus and hematemesis.  Genitourinary: Negative for dysuria, frequency, hematuria, flank pain, vaginal bleeding and vaginal discharge.  Musculoskeletal: Positive for myalgias (generalized). Negative for arthralgias.  Skin: Negative for rash.  Neurological: Positive for headaches. Negative for dizziness, weakness, light-headedness and numbness.  Psychiatric/Behavioral: Negative for confusion.   10 Systems reviewed and are negative for acute change except as noted in the HPI.    Allergies  Fluconazole and Robitussin (alcohol free)  Home Medications   Prior to Admission medications   Medication Sig Start Date End Date Taking? Authorizing Provider  amitriptyline (ELAVIL) 50 MG tablet Take 1 tablet (50 mg total) by mouth at bedtime. 04/22/14   Leone Haven, MD  carvedilol (COREG) 12.5 MG tablet Take 1 tablet (12.5 mg total) by mouth 2 (two) times daily with a meal. 05/16/14   Hilton Sinclair, MD  codeine 15 MG tablet Take 1 tablet (15 mg total) by mouth every 6 (six) hours as needed (cough). 06/02/14   Leone Haven, MD  cyclobenzaprine (FLEXERIL) 10 MG tablet Take 1 tablet (10 mg total) by mouth 3 (three) times daily as needed for muscle spasms. 03/20/14   Leone Haven, MD  hydrochlorothiazide (HYDRODIURIL) 25 MG tablet Take 1 tablet (25 mg total) by mouth daily. 04/22/14   Leone Haven, MD  HYDROcodone-homatropine [facility]) 5-1.5 MG/5ML syrup Take 5 mLs by mouth every 6 (six) hours as needed for cough. 06/04/14   Leone Haven, MD  lidocaine (LIDODERM) 5 % Place 0.5 patches onto the skin every 8 (eight) hours as needed  (pain).     Historical Provider, MD  loratadine (CLARITIN) 10 MG tablet Take 1 tablet (10 mg total) by mouth daily. 06/02/14   Leone Haven, MD  naproxen sodium (ANAPROX) 220 MG tablet Take 440 mg by mouth daily as needed (headache).    Historical Provider, MD  omeprazole (PRILOSEC) 20 MG capsule Take 1 capsule (20 mg total) by mouth daily. 06/02/14   Leone Haven, MD   BP 123/57 mmHg  Pulse 116  Temp(Src) 98.3 F (36.8 C) (Oral)  Resp 22  Ht 5\' 2"  (1.575 m)  Wt 225 lb (102.059 kg)  BMI 41.14 kg/m2  SpO2 99% Physical Exam  Constitutional: She is oriented to person, place, and time. She appears well-developed and well-nourished.  Non-toxic appearance. No distress.  Afebrile, NAD, appears to feel unwell but pleasant and nontoxic appearing. VSS aside from mild tachycardia  HENT:  Head: Normocephalic and atraumatic.  Mouth/Throat: Oropharynx is clear and moist. Mucous membranes are dry.  Dry mucous membranes  Eyes: Conjunctivae and EOM are normal. Right eye exhibits no discharge. Left eye exhibits no discharge.  Neck: Normal range of motion. Neck supple. No spinous process tenderness and no muscular tenderness present. No rigidity. Normal range of motion present.  FROM intact without spinous process or paraspinous muscle TTP, no  bony stepoffs or deformities, no muscle spasms. No rigidity or meningeal signs. No bruising or swelling.   Cardiovascular: Regular rhythm, normal heart sounds and intact distal pulses.  Tachycardia present.  Exam reveals no gallop and no friction rub.   No murmur heard. Mild tachycardia noted, reg rhythm, nl s1/s2, no m/r/g  Pulmonary/Chest: Effort normal and breath sounds normal. No respiratory distress. She has no decreased breath sounds. She has no wheezes. She has no rhonchi. She has no rales.  Abdominal: Soft. Normal appearance and bowel sounds are normal. She exhibits no distension. There is generalized tenderness. There is no rigidity, no rebound, no  guarding, no CVA tenderness, no tenderness at McBurney's point and negative Murphy's sign.  Soft, ND, +BS throughout, with mild generalized TTP throughout, no r/g/r, neg murphy's, neg mcburney's, no CVA TTP   Musculoskeletal: Normal range of motion.  MAE x4  Neurological: She is alert and oriented to person, place, and time. She has normal strength. No cranial nerve deficit or sensory deficit. Coordination normal. GCS eye subscore is 4. GCS verbal subscore is 5. GCS motor subscore is 6.  CN 2-12 grossly intact A&O x4 GCS 15 Sensation and strength intact Coordination with finger-to-nose WNL  Skin: Skin is warm, dry and intact. No rash noted.  Psychiatric: She has a normal mood and affect.  Nursing note and vitals reviewed.   ED Course  Procedures (including critical care time) Labs Review Labs Reviewed  CBC WITH DIFFERENTIAL - Abnormal; Notable for the following:    Neutrophils Relative % 83 (*)    Lymphocytes Relative 11 (*)    Lymphs Abs 0.6 (*)    All other components within normal limits  COMPREHENSIVE METABOLIC PANEL - Abnormal; Notable for the following:    Glucose, Bld 134 (*)    Total Bilirubin 0.2 (*)    GFR calc non Af Amer 57 (*)    GFR calc Af Amer 66 (*)    All other components within normal limits  URINALYSIS, ROUTINE W REFLEX MICROSCOPIC - Abnormal; Notable for the following:    APPearance CLOUDY (*)    Leukocytes, UA MODERATE (*)    All other components within normal limits  URINE MICROSCOPIC-ADD ON - Abnormal; Notable for the following:    Squamous Epithelial / LPF MANY (*)    Bacteria, UA MANY (*)    All other components within normal limits  URINE CULTURE  LIPASE, BLOOD  I-STAT TROPOININ, ED    Imaging Review No results found.   EKG Interpretation None      MDM   Final diagnoses:  Generalized abdominal pain  Non-intractable vomiting with nausea, vomiting of unspecified type  Viral gastroenteritis  Chronic tension-type headache, not  intractable  Dehydration  Tachycardia    59 y.o. female with abd pain/n/v with known sick contacts, as well as mild headache similar to prior headaches. No red flag s/sx and nonfocal neuro exam therefore doubt SAH, meningitis, CVA/TIA, temporal arteritis, or other intracranial pathology. Likely due to dehydration. Mild tachycardia likely from dehydration as well, although pt has chronic tachycardia when seen as outpatient. Will give pain meds and fluids, and PO challenge since zofran was given prior to exam and pt feeling improved. Abd exam benign, doubt need for imaging at this time. Trop neg, CBC w/diff WNL, CMP with mildly low GFR likely related to dehydration, lipase WNL. Awaiting U/A. Will reassess shortly.  8:48 AM Pt with mild improvement of HA, now down to 7/10. Tachycardia persisting, but when I  reassessed pt it appears that only 0.5L has been given and the IV was disconnected (when she went to the bathroom) and hasn't been restarted. Will let last portion of this go in, and give one more bolus. U/A not sent yet, will discuss with nursing. Of note, pt initially vomited when PO challenged, but it had been 5hrs since zofran was given therefore given more zofran and pt now tolerating ice chips, given water to see if she can tolerate this.  9:25 AM Tolerating water now, HA still lingering, states that when she rests she feels better but she keeps waking up. Will give toradol, benadryl, and prednisone PO to help with rebound. Will let first bolus finish and then get 2nd bolus in prior to d/c.   11:08 AM U/A resulting and appears to be contaminated specimen, sent for UCx but will not treat since pt not having symptoms. VS improved at this time, still slightly tachycardic but this is c/w prior visits. Feeling overall improved, tolerating fluids. Will give phenergan (pt concerned with cost therefore will give this instead of zofran) and pain meds for home. Discussed return precautions. Will have her  f/up with PCP in 1wk. I explained the diagnosis and have given explicit precautions to return to the ER including for any other new or worsening symptoms. The patient understands and accepts the medical plan as it's been dictated and I have answered their questions. Discharge instructions concerning home care and prescriptions have been given. The patient is STABLE and is discharged to home in good condition.  BP 137/81 mmHg  Pulse 112  Temp(Src) 98.5 F (36.9 C) (Oral)  Resp 22  Ht 5\' 2"  (1.575 m)  Wt 225 lb (102.059 kg)  BMI 41.14 kg/m2  SpO2 100%  Meds ordered this encounter  Medications  . ondansetron (ZOFRAN-ODT) disintegrating tablet 8 mg    Sig:   . fentaNYL (SUBLIMAZE) injection 50 mcg    Sig:   . sodium chloride 0.9 % bolus 1,000 mL    Sig:   . morphine 4 MG/ML injection 4 mg    Sig:   . ondansetron (ZOFRAN) injection 4 mg    Sig:   . sodium chloride 0.9 % bolus 1,000 mL    Sig:   . ketorolac (TORADOL) 30 MG/ML injection 30 mg    Sig:   . diphenhydrAMINE (BENADRYL) injection 25 mg    Sig:   . predniSONE (DELTASONE) tablet 60 mg    Sig:   . promethazine (PHENERGAN) 12.5 MG tablet    Sig: Take 2 tablets (25 mg total) by mouth every 6 (six) hours as needed for nausea or vomiting.    Dispense:  20 tablet    Refill:  0    Order Specific Question:  Supervising Provider    Answer:  Noemi Chapel D [[id number]]  . naproxen (NAPROSYN) 500 MG tablet    Sig: Take 1 tablet (500 mg total) by mouth 2 (two) times daily as needed for mild pain, moderate pain or headache (TAKE WITH MEALS.).    Dispense:  20 tablet    Refill:  0    Order Specific Question:  Supervising Provider    Answer:  Noemi Chapel D [[id number]]  . HYDROcodone-acetaminophen (NORCO) 5-325 MG per tablet    Sig: Take 1-2 tablets by mouth every 6 (six) hours as needed for severe pain.    Dispense:  6 tablet    Refill:  0    Order Specific Question:  Supervising Provider  Answer:  Johnna Acosta [address] [place], PA-C 07/18/14 1115  Everlene Balls, MD 07/18/14 [phone]

## 2014-07-18 NOTE — Discharge Instructions (Signed)
Use phenergan as directed as needed for nausea. Stay well hydrated with small sips of fluids. Use naprosyn or norco as directed as needed for pain or headache. Get plenty of rest. See your regular doctor in 1 week for recheck. Return to the ER for changes or worsening symptoms. Abdominal (belly) pain can be caused by many things. Your caregiver performed an examination and possibly ordered blood/urine tests and imaging (CT scan, x-rays, ultrasound). Many cases can be observed and treated at home after initial evaluation in the emergency department. Even though you are being discharged home, abdominal pain can be unpredictable. Therefore, you need a repeated exam if your pain does not resolve, returns, or worsens. Most patients with abdominal pain don't have to be admitted to the hospital or have surgery, but serious problems like appendicitis and gallbladder attacks can start out as nonspecific pain. Many abdominal conditions cannot be diagnosed in one visit, so follow-up evaluations are very important. SEEK IMMEDIATE MEDICAL ATTENTION IF YOU DEVELOP ANY OF THE FOLLOWING SYMPTOMS:  The pain does not go away or becomes severe.   A temperature above 101 develops.   Repeated vomiting occurs (multiple episodes).   The pain becomes localized to portions of the abdomen. The right side could possibly be appendicitis. In an adult, the left lower portion of the abdomen could be colitis or diverticulitis.   Blood is being passed in stools or vomit (bright red or black tarry stools).   Return also if you develop chest pain, difficulty breathing, dizziness or fainting, or become confused, poorly responsive, or inconsolable (young children).  The constipation stays for more than 4 days.   There is belly (abdominal) or rectal pain.   You do not seem to be getting better.     Abdominal Pain, Women Abdominal (stomach, pelvic, or belly) pain can be caused by many things. It is important to tell your  doctor:  The location of the pain.  Does it come and go or is it present all the time?  Are there things that start the pain (eating certain foods, exercise)?  Are there other symptoms associated with the pain (fever, nausea, vomiting, diarrhea)? All of this is helpful to know when trying to find the cause of the pain. CAUSES   Stomach: virus or bacteria infection, or ulcer.  Intestine: appendicitis (inflamed appendix), regional ileitis (Crohn's disease), ulcerative colitis (inflamed colon), irritable bowel syndrome, diverticulitis (inflamed diverticulum of the colon), or cancer of the stomach or intestine.  Gallbladder disease or stones in the gallbladder.  Kidney disease, kidney stones, or infection.  Pancreas infection or cancer.  Fibromyalgia (pain disorder).  Diseases of the female organs:  Uterus: fibroid (non-cancerous) tumors or infection.  Fallopian tubes: infection or tubal pregnancy.  Ovary: cysts or tumors.  Pelvic adhesions (scar tissue).  Endometriosis (uterus lining tissue growing in the pelvis and on the pelvic organs).  Pelvic congestion syndrome (female organs filling up with blood just before the menstrual period).  Pain with the menstrual period.  Pain with ovulation (producing an egg).  Pain with an IUD (intrauterine device, birth control) in the uterus.  Cancer of the female organs.  Functional pain (pain not caused by a disease, may improve without treatment).  Psychological pain.  Depression. DIAGNOSIS  Your doctor will decide the seriousness of your pain by doing an examination.  Blood tests.  X-rays.  Ultrasound.  CT scan (computed tomography, special type of X-ray).  MRI (magnetic resonance imaging).  Cultures, for infection.  Barium enema (  dye inserted in the large intestine, to better view it with X-rays).  Colonoscopy (looking in intestine with a lighted tube).  Laparoscopy (minor surgery, looking in abdomen with a  lighted tube).  Major abdominal exploratory surgery (looking in abdomen with a large incision). TREATMENT  The treatment will depend on the cause of the pain.   Many cases can be observed and treated at home.  Over-the-counter medicines recommended by your caregiver.  Prescription medicine.  Antibiotics, for infection.  Birth control pills, for painful periods or for ovulation pain.  Hormone treatment, for endometriosis.  Nerve blocking injections.  Physical therapy.  Antidepressants.  Counseling with a psychologist or psychiatrist.  Minor or major surgery. HOME CARE INSTRUCTIONS   Do not take laxatives, unless directed by your caregiver.  Take over-the-counter pain medicine only if ordered by your caregiver. Do not take aspirin because it can cause an upset stomach or bleeding.  Try a clear liquid diet (broth or water) as ordered by your caregiver. Slowly move to a bland diet, as tolerated, if the pain is related to the stomach or intestine.  Have a thermometer and take your temperature several times a day, and record it.  Bed rest and sleep, if it helps the pain.  Avoid sexual intercourse, if it causes pain.  Avoid stressful situations.  Keep your follow-up appointments and tests, as your caregiver orders.  If the pain does not go away with medicine or surgery, you may try:  Acupuncture.  Relaxation exercises (yoga, meditation).  Group therapy.  Counseling. SEEK MEDICAL CARE IF:   You notice certain foods cause stomach pain.  Your home care treatment is not helping your pain.  You need stronger pain medicine.  You want your IUD removed.  You feel faint or lightheaded.  You develop nausea and vomiting.  You develop a rash.  You are having side effects or an allergy to your medicine. SEEK IMMEDIATE MEDICAL CARE IF:   Your pain does not go away or gets worse.  You have a fever.  Your pain is felt only in portions of the abdomen. The right  side could possibly be appendicitis. The left lower portion of the abdomen could be colitis or diverticulitis.  You are passing blood in your stools (bright red or black tarry stools, with or without vomiting).  You have blood in your urine.  You develop chills, with or without a fever.  You pass out. MAKE SURE YOU:   Understand these instructions.  Will watch your condition.  Will get help right away if you are not doing well or get worse. Document Released: 04/17/2007 Document Revised: 11/04/2013 Document Reviewed: 05/07/2009 [facility] Patient Information 2015 [place], [place]. This information is not intended to replace advice given to you by your health care provider. Make sure you discuss any questions you have with your health care provider.  Viral Gastroenteritis Viral gastroenteritis is also called stomach flu. This illness is caused by a certain type of germ (virus). It can cause sudden watery poop (diarrhea) and throwing up (vomiting). This can cause you to lose body fluids (dehydration). This illness usually lasts for 3 to 8 days. It usually goes away on its own. HOME CARE   Drink enough fluids to keep your pee (urine) clear or pale yellow. Drink small amounts of fluids often.  Ask your doctor how to replace body fluid losses (rehydration).  Avoid:  Foods high in sugar.  Alcohol.  Bubbly (carbonated) drinks.  Tobacco.  Juice.  Caffeine drinks.  Very hot or cold fluids.  Fatty, greasy foods.  Eating too much at one time.  Dairy products until 24 to 48 hours after your watery poop stops.  You may eat foods with active cultures (probiotics). They can be found in some yogurts and supplements.  Wash your hands well to avoid spreading the illness.  Only take medicines as told by your doctor. Do not give aspirin to children. Do not take medicines for watery poop (antidiarrheals).  Ask your doctor if you should keep taking your regular medicines.  Keep all  doctor visits as told. GET HELP RIGHT AWAY IF:   You cannot keep fluids down.  You do not pee at least once every 6 to 8 hours.  You are short of breath.  You see blood in your poop or throw up. This may look like coffee grounds.  You have belly (abdominal) pain that gets worse or is just in one small spot (localized).  You keep throwing up or having watery poop.  You have a fever.  The patient is a child younger than 3 months, and he or she has a fever.  The patient is a child older than 3 months, and he or she has a fever and problems that do not go away.  The patient is a child older than 3 months, and he or she has a fever and problems that suddenly get worse.  The patient is a baby, and he or she has no tears when crying. MAKE SURE YOU:   Understand these instructions.  Will watch your condition.  Will get help right away if you are not doing well or get worse. Document Released: 12/07/2007 Document Revised: 09/12/2011 Document Reviewed: 04/06/2011 [facility] Patient Information 2015 [place], [place]. This information is not intended to replace advice given to you by your health care provider. Make sure you discuss any questions you have with your health care provider.  Nausea and Vomiting Nausea means you feel sick to your stomach. Throwing up (vomiting) is a reflex where stomach contents come out of your mouth. HOME CARE   Take medicine as told by your doctor.  Do not force yourself to eat. However, you do need to drink fluids.  If you feel like eating, eat a normal diet as told by your doctor.  Eat rice, wheat, potatoes, bread, lean meats, yogurt, fruits, and vegetables.  Avoid high-fat foods.  Drink enough fluids to keep your pee (urine) clear or pale yellow.  Ask your doctor how to replace body fluid losses (rehydrate). Signs of body fluid loss (dehydration) include:  Feeling very thirsty.  Dry lips and mouth.  Feeling dizzy.  Dark pee.  Peeing less  than normal.  Feeling confused.  Fast breathing or heart rate. GET HELP RIGHT AWAY IF:   You have blood in your throw up.  You have black or bloody poop (stool).  You have a bad headache or stiff neck.  You feel confused.  You have bad belly (abdominal) pain.  You have chest pain or trouble breathing.  You do not pee at least once every 8 hours.  You have cold, clammy skin.  You keep throwing up after 24 to 48 hours.  You have a fever. MAKE SURE YOU:   Understand these instructions.  Will watch your condition.  Will get help right away if you are not doing well or get worse. Document Released: 12/07/2007 Document Revised: 09/12/2011 Document Reviewed: 11/19/2010 [facility] Patient Information 2015 [place], [place]. This information is  not intended to replace advice given to you by your health care provider. Make sure you discuss any questions you have with your health care provider.  Rehydration, Adult Rehydration is the replacement of body fluids lost during dehydration. Dehydration is an extreme loss of body fluids to the point of body function impairment. There are many ways extreme fluid loss can occur, including vomiting, diarrhea, or excess sweating. Recovering from dehydration requires replacing lost fluids, continuing to eat to maintain strength, and avoiding foods and beverages that may contribute to further fluid loss or may increase nausea. HOW TO REHYDRATE In most cases, rehydration involves the replacement of not only fluids but also carbohydrates and basic body salts. Rehydration with an oral rehydration solution is one way to replace essential nutrients lost through dehydration. An oral rehydration solution can be purchased at pharmacies, retail stores, and online. Premixed packets of powder that you combine with water to make a solution are also sold. You can prepare an oral rehydration solution at home by mixing the following ingredients together:    - tsp  table salt.   tsp baking soda.   tsp salt substitute containing potassium chloride.  1 tablespoons sugar.  1 L (34 oz) of water. Be sure to use exact measurements. Including too much sugar can make diarrhea worse. Drink -1 cup (120-240 mL) of oral rehydration solution each time you have diarrhea or vomit. If drinking this amount makes your vomiting worse, try drinking smaller amounts more often. For example, drink 1-3 tsp every 5-10 minutes.  A general rule for staying hydrated is to drink 1-2 L of fluid per day. Talk to your caregiver about the specific amount you should be drinking each day. Drink enough fluids to keep your urine clear or pale yellow. EATING WHEN DEHYDRATED Even if you have had severe sweating or you are having diarrhea, do not stop eating. Many healthy items in a normal diet are okay to continue eating while recovering from dehydration. The following tips can help you to lessen nausea when you eat:  Ask someone else to prepare your food. Cooking smells may worsen nausea.  Eat in a well-ventilated room away from cooking smells.  Sit up when you eat. Avoid lying down until 1-2 hours after eating.  Eat small amounts when you eat.  Eat foods that are easy to digest. These include soft, well-cooked, or mashed foods. FOODS AND BEVERAGES TO AVOID Avoid eating or drinking the following foods and beverages that may increase nausea or further loss of fluid:   Fruit juices with a high sugar content, such as concentrated juices.  Alcohol.  Beverages containing caffeine.  Carbonated drinks. They may cause a lot of gas.  Foods that may cause a lot of gas, such as cabbage, broccoli, and beans.  Fatty, greasy, and fried foods.  Spicy, very salty, and very sweet foods or drinks.  Foods or drinks that are very hot or very cold. Consume food or drinks at or near room temperature.  Foods that need a lot of chewing, such as raw vegetables.  Foods that are sticky or hard  to swallow, such as peanut butter. Document Released: 09/12/2011 Document Revised: 03/14/2012 Document Reviewed: 09/12/2011 [facility] Patient Information 2015 [place], [place]. This information is not intended to replace advice given to you by your health care provider. Make sure you discuss any questions you have with your health care provider.

## 2014-07-18 NOTE — ED Notes (Signed)
Pt c/o headache. PA made aware.

## 2014-07-18 NOTE — ED Notes (Signed)
Patient given gingerale for PO challenge.

## 2014-07-18 NOTE — ED Notes (Signed)
The patient said she has been nauseous and vomiting since 2330hrs.  She also says her abdomen has been hurting but it is not hurting that bad now.  She said she kept a grandchild that had a stomach virus and thinks she has it now.  She rates her pain 5/10.  She is also complaining of a headache and rates it 9/10.

## 2014-07-20 LAB — URINE CULTURE: SPECIAL REQUESTS: NORMAL

## 2014-07-28 ENCOUNTER — Ambulatory Visit: Payer: No Typology Code available for payment source | Admitting: Family Medicine

## 2014-09-15 ENCOUNTER — Telehealth: Payer: Self-pay | Admitting: Family Medicine

## 2014-09-15 ENCOUNTER — Ambulatory Visit (INDEPENDENT_AMBULATORY_CARE_PROVIDER_SITE_OTHER): Payer: Self-pay | Admitting: Family Medicine

## 2014-09-15 ENCOUNTER — Encounter: Payer: Self-pay | Admitting: Family Medicine

## 2014-09-15 VITALS — BP 146/81 | HR 87 | Temp 97.9°F | Ht 62.0 in | Wt 232.1 lb

## 2014-09-15 DIAGNOSIS — R05 Cough: Secondary | ICD-10-CM

## 2014-09-15 DIAGNOSIS — R059 Cough, unspecified: Secondary | ICD-10-CM

## 2014-09-15 MED ORDER — ALBUTEROL SULFATE HFA 108 (90 BASE) MCG/ACT IN AERS: 2.0000 | INHALATION_SPRAY | Freq: Four times a day (QID) | RESPIRATORY_TRACT | Status: DC | PRN

## 2014-09-15 MED ORDER — OMEPRAZOLE 40 MG PO CPDR: 40.0000 mg | DELAYED_RELEASE_CAPSULE | Freq: Every day | ORAL | Status: DC

## 2014-09-15 NOTE — Patient Instructions (Signed)
It was nice to see you today.  Schedule an appointment with pharmacy clinic (Dr. Valentina Lucks) for PFT's.  I have increased your omeprazole to 40 mg daily.  Also, try the albuterol inhaler to see if that helps.  Do not take 24 hours prior to PFT's.  Follow with Dr. Caryl Bis in approximately 2 weeks.  Take care  Dr. Lacinda Axon

## 2014-09-15 NOTE — Progress Notes (Signed)
   Subjective:    Patient ID: Kristin [name], female    DOB: Jun 26, 1956, 59 y.o.   MRN: [id number]  HPI 59 year old female with a history of tobacco abuse (quit 2015) presents for same day appointment complains of cough.  1) Cough  Patient reports that she has had a slightly productive cough for approximately 6 months.  Patient was seen previously for this in November 2015.  At that time, patient was treated for postnasal drip as well as GERD.  She followed up with her primary physician and chest x-ray was obtained and was negative.  She presents today for continued complaints of cough.  She continues to endorse some mild posttussive emesis.  She states that she has had no relief with PPI. She has not been taking antihistamine on a regular basis.  She denies any associated shortness of breath. No weight loss. She does endorse some difficulty with reflux at times.  She states that her cough is worse in the evenings. No relieving factors. She continues to use cough drops/throat lozenges to keep her from coughing.  Symptoms Sputum:yes  Fever: no  Shortness of breath:no  Leg Swelling:no  Heart Burn or Reflux:yes  Wheezing:no  Post Nasal Drip: Likely  Red Flags Weight Loss:  no Immunocompromised:  no  PMH Asthma or COPD: no  PMH of Smoking: yes  Using ACEIs: no   Review of Systems Per HPI    Objective:   Physical Exam Filed Vitals:   09/15/14 0939  BP: 146/81  Pulse: 87  Temp: 97.9 F (36.6 C)   Exam: General: well appearing obese female in no acute distress. HEENT: NCAT. Oropharynx mildly erythematous. Normal TMs bilaterally. Cardiovascular: RRR. No murmurs, rubs, or gallops. Respiratory: CTAB. No rales, rhonchi, or wheeze.    Assessment & Plan:  See Problem List

## 2014-09-15 NOTE — Telephone Encounter (Signed)
I gave albuterol and omeprazole which are both very cheap.  I'm not sure what she is unable to afford.

## 2014-09-15 NOTE — Telephone Encounter (Signed)
Was seen this morning and needs Rx that costs less / sr

## 2014-09-15 NOTE — Assessment & Plan Note (Signed)
Postnasal drip/upper airway cough syndrome  versus GERD versus undiagnosed asthma/COPD. Patient has had a chest x-ray which was negative. She's also been treated with low-dose PPI with no improvement.  She has not been taking the prescribed antihistamine as indicated. I have increased the omeprazole to 40 mg daily. I advised her to take an antihistamine on a regular basis. Additionally, given her smoking history I recommended that she schedule an appointment with Dr. Valentina Lucks for PFT's.

## 2014-09-16 NOTE — Telephone Encounter (Signed)
Pt calling back, says one of them is $86 and the other is $56, has no insurance and needs something cheaper.

## 2014-09-16 NOTE — Telephone Encounter (Signed)
Tried to call patient but was unable to LM.  Please ask her which pharmacy she took her prescriptions too.  She might have to wait until she sees [place] tomorrow and take them to the health department. Lucero Ide,CMA

## 2014-09-17 ENCOUNTER — Ambulatory Visit: Payer: Self-pay

## 2014-09-20 ENCOUNTER — Other Ambulatory Visit: Payer: Self-pay | Admitting: Family Medicine

## 2014-09-21 NOTE — Telephone Encounter (Signed)
Refill given for one month. Needs follow-up specifically for her blood pressure in the next month. Please inform patient of this thanks.

## 2014-09-23 ENCOUNTER — Ambulatory Visit (INDEPENDENT_AMBULATORY_CARE_PROVIDER_SITE_OTHER): Payer: Self-pay | Admitting: Pharmacist

## 2014-09-23 ENCOUNTER — Encounter: Payer: Self-pay | Admitting: Pharmacist

## 2014-09-23 VITALS — BP 153/92 | HR 107 | Ht 63.0 in | Wt 232.0 lb

## 2014-09-23 DIAGNOSIS — R05 Cough: Secondary | ICD-10-CM

## 2014-09-23 DIAGNOSIS — I10 Essential (primary) hypertension: Secondary | ICD-10-CM

## 2014-09-23 DIAGNOSIS — R059 Cough, unspecified: Secondary | ICD-10-CM

## 2014-09-23 NOTE — Patient Instructions (Addendum)
It was nice to meet you today.  Your lung function tests were normal today!  Congratulations on quitting smoking - keep up the great work.  Follow up with Dr. Caryl Bis.

## 2014-09-23 NOTE — Assessment & Plan Note (Signed)
Hypertension:  Patient's blood pressure was mildly elevated today 153/92, but patient reports feeling anxious about the results of her visit. To follow up with PCP on blood pressure next week.

## 2014-09-23 NOTE — Assessment & Plan Note (Signed)
" >>  ASSESSMENT AND PLAN FOR HYPERTENSION, BENIGN SYSTEMIC WRITTEN ON 09/23/2014  4:28 PM BY Johnedward Brodrick G, RPH  Hypertension:  Patient's blood pressure was mildly elevated today 153/92, but patient reports feeling anxious about the results of her visit. To follow up with PCP on blood pressure next week.  "

## 2014-09-23 NOTE — Progress Notes (Signed)
S:    Patient arrives in good spirits walking without assistance. Presents for lung function evaluation.  Patient reports breathing has been "an issue" and reports she has coughed "even more than usual" today. Reports needing 3 pillows to sleep at night and waking up coughing often. Says recently she quit smoking in the past 6 months and the cough coincided with her quit date. Also reports leg/hip "throbbing" pain at a 2/10 in her joints.  Patient reports being recently prescribed an albuterol inhaler but says the medication was too expensive to pick up and has not taken any medications for asthma at this time (never had albuterol in the past).  O: See "scanned report" or Documentation Flowsheet (discrete results - PFTs) for  Spirometry results. Patient provided good effort while attempting spirometry.   CAT score = 36  A/P: Cough: Spirometry evaluation reveals normal lung function. Patient has been experiencing productive cough with mild post-cough emesis for ~6 months since she quit smoking. Patient was previously provided with a prescription for an albuterol inhaler but was unable to afford the medication. Reviewed results of pulmonary function tests and discussed with patient that due to her normal lung function, her chronic cough is likely a "smoker's cough" related to her long smoking history. Explained to patient to monitor her symptoms going forward. Patient is currently being enrolled in the MAP program to help obtain medications. Patient verbalized understanding of results and education.   Hypertension:  Patient's blood pressure was mildly elevated today 153/92, but patient reports feeling anxious about the results of her visit. To follow up with PCP on blood pressure next week.   Written pt instructions provided.  F/U Clinic visit 09/30/14 with Dr. Caryl Bis. Total time in face to face counseling 15 minutes.  Patient seen with Harland German, PharmD Candidate, Elicia Lamp,  PharmD Resident,  and Nicoletta Ba, PharmD resident.

## 2014-09-23 NOTE — Assessment & Plan Note (Signed)
Cough: Spirometry evaluation reveals normal lung function. Patient has been experiencing productive cough with mild post-cough emesis for ~6 months since she quit smoking. Patient was previously provided with a prescription for an albuterol inhaler but was unable to afford the medication. Reviewed results of pulmonary function tests and discussed with patient that due to her normal lung function, her chronic cough is likely a "smoker's cough" related to her long smoking history. Explained to patient to monitor her symptoms going forward. Patient is currently being enrolled in the MAP program to help obtain medications. Patient verbalized understanding of results and education.

## 2014-09-23 NOTE — Progress Notes (Signed)
I was the preceptor for this visit. 

## 2014-09-24 ENCOUNTER — Telehealth: Payer: Self-pay | Admitting: Family Medicine

## 2014-09-25 NOTE — Telephone Encounter (Signed)
error 

## 2014-09-30 ENCOUNTER — Ambulatory Visit: Payer: Self-pay | Admitting: Family Medicine

## 2014-10-06 ENCOUNTER — Other Ambulatory Visit: Payer: Self-pay | Admitting: Family Medicine

## 2014-10-16 ENCOUNTER — Other Ambulatory Visit: Payer: Self-pay | Admitting: Family Medicine

## 2014-10-27 ENCOUNTER — Other Ambulatory Visit: Payer: Self-pay | Admitting: Family Medicine

## 2014-10-27 NOTE — Telephone Encounter (Signed)
Letter mailed to patient to contact us for a follow up appt. Kristin [name],CMA

## 2014-10-27 NOTE — Telephone Encounter (Signed)
Refill given for one month. Patient needs follow-up in the office for her blood pressure. Please inform the patient.

## 2014-11-12 ENCOUNTER — Ambulatory Visit (HOSPITAL_COMMUNITY)
Admission: RE | Admit: 2014-11-12 | Discharge: 2014-11-12 | Disposition: A | Discharge status: Home / Self Care | Payer: Self-pay | Source: Ambulatory Visit | Attending: Family Medicine | Admitting: Family Medicine

## 2014-11-12 ENCOUNTER — Encounter: Payer: Self-pay | Admitting: Family Medicine

## 2014-11-12 ENCOUNTER — Ambulatory Visit (INDEPENDENT_AMBULATORY_CARE_PROVIDER_SITE_OTHER): Payer: Self-pay | Admitting: Family Medicine

## 2014-11-12 VITALS — BP 136/84 | HR 68 | Temp 98.5°F | Ht 63.0 in | Wt 232.0 lb

## 2014-11-12 DIAGNOSIS — M4692 Unspecified inflammatory spondylopathy, cervical region: Secondary | ICD-10-CM

## 2014-11-12 DIAGNOSIS — M542 Cervicalgia: Secondary | ICD-10-CM

## 2014-11-12 DIAGNOSIS — R404 Transient alteration of awareness: Secondary | ICD-10-CM

## 2014-11-12 DIAGNOSIS — R4020 Unspecified coma: Secondary | ICD-10-CM

## 2014-11-12 DIAGNOSIS — M25552 Pain in left hip: Secondary | ICD-10-CM

## 2014-11-12 DIAGNOSIS — G44209 Tension-type headache, unspecified, not intractable: Secondary | ICD-10-CM

## 2014-11-12 DIAGNOSIS — M47812 Spondylosis without myelopathy or radiculopathy, cervical region: Secondary | ICD-10-CM

## 2014-11-12 DIAGNOSIS — R55 Syncope and collapse: Secondary | ICD-10-CM | POA: Insufficient documentation

## 2014-11-12 LAB — CBC
HCT: 36.9 % (ref 36.0–46.0)
Hemoglobin: 12.5 g/dL (ref 12.0–15.0)
MCH: 29.8 pg (ref 26.0–34.0)
MCHC: 33.9 g/dL (ref 30.0–36.0)
MCV: 88.1 fL (ref 78.0–100.0)
MPV: 8.9 fL (ref 8.6–12.4)
Platelets: 275 10*3/uL (ref 150–400)
RBC: 4.19 MIL/uL (ref 3.87–5.11)
RDW: 13.2 % (ref 11.5–15.5)
WBC: 5.7 10*3/uL (ref 4.0–10.5)

## 2014-11-12 NOTE — Progress Notes (Signed)
Mammogram information given to patient.  Jazmin Hartsell,CMA  

## 2014-11-12 NOTE — Patient Instructions (Addendum)
Nice to see you. We are going to order an MRI of your neck.  We will refer you to physical therapy and cardiology.  If you have another syncopal episode, chest pain, shortness of breath or feel your heart flutter please seek medical attention.  Please go get the x-ray of your hips.

## 2014-11-13 ENCOUNTER — Telehealth: Payer: Self-pay | Admitting: Family Medicine

## 2014-11-13 LAB — BASIC METABOLIC PANEL
BUN: 13 mg/dL (ref 6–23)
CALCIUM: 10.3 mg/dL (ref 8.4–10.5)
CO2: 31 meq/L (ref 19–32)
CREATININE: 0.91 mg/dL (ref 0.50–1.10)
Chloride: 97 mEq/L (ref 96–112)
GLUCOSE: 96 mg/dL (ref 70–99)
Potassium: 3.5 mEq/L (ref 3.5–5.3)
SODIUM: 138 meq/L (ref 135–145)

## 2014-11-13 LAB — TSH: TSH: 0.49 u[IU]/mL (ref 0.350–4.500)

## 2014-11-13 NOTE — Telephone Encounter (Signed)
Called patient and informed of lab results.

## 2014-11-14 DIAGNOSIS — M47812 Spondylosis without myelopathy or radiculopathy, cervical region: Secondary | ICD-10-CM | POA: Insufficient documentation

## 2014-11-14 DIAGNOSIS — R4020 Unspecified coma: Secondary | ICD-10-CM | POA: Insufficient documentation

## 2014-11-14 DIAGNOSIS — M1612 Unilateral primary osteoarthritis, left hip: Secondary | ICD-10-CM | POA: Insufficient documentation

## 2014-11-14 NOTE — Assessment & Plan Note (Addendum)
Patient with continued headache and cervical neck pain. Have been stable, though persistent despite conservative measures. Likely relating to arthritic changes in neck. Is neurologically intact at this time. No radicular symptoms. No prior neuro issues. Patient has had persistent issues with this and given time length with no improvement will obtain MRI cervical spine to further evaluate this issue. Will refer to PT as well. Pending results of MRI will likely refer to either sports med or neurosurgery.  Precepted with Dr Gwendlyn Deutscher

## 2014-11-14 NOTE — Assessment & Plan Note (Addendum)
Continued headaches. Have remained similar to prior tension headaches. Neurologically intact. 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 Will obtain cervical spine MRI and refer based off of results.

## 2014-11-14 NOTE — Progress Notes (Signed)
Patient ID: Kristin [name], female   DOB: [birth year], 59 y.o.   MRN: [id number]  Tommi Rumps, MD Phone: [phone]  Kristin [name] is a 59 y.o. female who presents today for f/u.  Left shoulder and neck pain: reports continued issues in this area. Is a stinging pain in left neck, shoulder, and left head. Had XR neck previously revealing degenerative changes. Has done neck exercise in the past with no improvement. Has been on tylenol, aleve, ibuprofen, and amitriptyline previously with no benefit. Has issues sleeping due to pain. Muscle relaxers also did not help. No numbness, tingling, or weakness. Also notes left hip pain that is gradually getting worse and is exacerbated by walking. Is anterior hip pain.  Possible loss of consciousness: this past Sunday had an episode where she was walking and then all of the sudden she was on the floor. She does not remember how she got there. She denies injury from this. She denies chest pain, dyspnea, and palpitations with this. Was witnessed by a family member and there was no shaking visualized. No postictal phase. No incontinence. Had a similar episode one year ago, though was not seen for this. Has a seizure history, though has not had a seizure in a number of years and has not been on medication.  PMH: former smoker.   ROS: Per HPI   Physical Exam Filed Vitals:   11/12/14 1510  BP: 136/84  Pulse: 68  Temp: 98.5 F (36.9 C)    Gen: Well NAD HEENT: PERRL,  MMM Lungs: CTABL Nl WOB Heart: RRR, no murmur appreciated MSK: no midline neck tenderness, mild trap spasm and tenderness, full ROM neck though some discomfort with turning to the left, negative spurling, discomfort with internal rotation of left hip, good ROM bilaterally in hips Neuro: CN 2-12 intact, 5/5 strength in bilateral biceps, triceps, grip, quads, hamstrings, plantar and dorsiflexion, sensation to light touch intact in bilateral UE and LE, normal gait, 2+ patellar  reflexes Exts: Non edematous BL  LE, warm and well perfused.    Assessment/Plan: Please see individual problem list.  Tommi Rumps, MD [facility] PGY-3

## 2014-11-14 NOTE — Assessment & Plan Note (Signed)
Has known OA of hips on review of prior imaging from 2011. Will obtain XR hips to determine degree of current OA and to determine next step in treatment.

## 2014-11-14 NOTE — Assessment & Plan Note (Signed)
Patient with one episode of LOC on Sunday. No preceeding symptoms is concerning for arrhythmia as cause. No seizure like features to the episode, though patient does have a history of seizures. Not technically orthostatic today though BP did drop some from laying to standing. No murmur appreciated to indicate aortic stenosis. EKG with sinus tachycardia and stable nonspecific ST changes. Discussed options with patient including holter monitor vs cardiology referral and decided on cardiology referral for further evaluation. Advised that if this were to occur again she needs to go straight to the ED. Given return precautions.   Precepted with Dr Gwendlyn Deutscher.

## 2014-11-21 ENCOUNTER — Ambulatory Visit (INDEPENDENT_AMBULATORY_CARE_PROVIDER_SITE_OTHER): Payer: No Typology Code available for payment source | Admitting: Internal Medicine

## 2014-11-21 ENCOUNTER — Encounter: Payer: Self-pay | Admitting: Internal Medicine

## 2014-11-21 VITALS — BP 151/97 | HR 115 | Ht 62.0 in | Wt 233.2 lb

## 2014-11-21 DIAGNOSIS — R0609 Other forms of dyspnea: Secondary | ICD-10-CM

## 2014-11-21 DIAGNOSIS — R4020 Unspecified coma: Secondary | ICD-10-CM

## 2014-11-21 DIAGNOSIS — R55 Syncope and collapse: Secondary | ICD-10-CM

## 2014-11-21 DIAGNOSIS — R404 Transient alteration of awareness: Secondary | ICD-10-CM

## 2014-11-21 MED ORDER — METOPROLOL SUCCINATE ER 50 MG PO TB24: 50.0000 mg | ORAL_TABLET | Freq: Two times a day (BID) | ORAL | Status: DC

## 2014-11-21 NOTE — Progress Notes (Signed)
ELECTROPHYSIOLOGY CONSULT NOTE  Patient ID: MCKINLEY ADELSTEIN, MRN: [id number], DOB/AGE: 12-22-1955 59 y.o. Admit date: (Not on file) Date of Consult: 11/21/2014  Primary Physician: Tommi Rumps, MD Primary Cardiologist: new  Chief Complaint: syncope   HPI JEANI FASSNACHT is a 59 y.o. female  Referred for an episode of syncope. This occurred when she got up in the middle of the night abruptly to attend to her grandson. She found herself on the floor. She then noted that she had persistent dizziness which made it difficult to get up for some time. She does not recall being nauseated or hot. She does not recall being diaphoretic.  She had an episode of syncope about 5 years before. She was in bed with her grandmother and went to the bathroom and passed out on the way. She is not sure whether the recovery symptoms are similar.  She has a long-standing history of orthostatic presyncope which is required her to sit down. In her mind the recovery symptoms of those episodes are not dissimilar from what happened when she awakened on the floor recently.  Curiously, she also has a history of a seizure disorder. This was associated with some recognition that something was awry and she was followed for this by Dr. Erling Cruz and treated with antiepileptic for many years. She does not recall whether she had an abnormal EEG or not. She does recall however, that some of these episodes occurred while seated as well as while lying down and indeed awakened her from sleep.  She has a history of dyspnea on exertion. She denies accompanying chest discomfort. She has occasional peripheral edema but no nocturnal dyspnea.  She has sleep disordered breathing and daytime somnolence. She has put on a bunch of weight recently. She stopped smoking 6 months ago.    Past Medical History  Diagnosis Date  . Back pain, chronic   . GERD (gastroesophageal reflux disease)   . Obesity   . Tobacco abuse   . Hypertension    . Hyperlipidemia   . Seizures   . Depression   . Somatization disorder   . Renal sclerosis, unspecified     only has 1 kidney      Surgical History:  Past Surgical History  Procedure Laterality Date  . Tubal ligation    . Abdominal hysterectomy    . Cholecystectomy       Home Meds: Prior to Admission medications   Medication Sig Start Date End Date Taking? Authorizing Provider  acetaminophen (TYLENOL) 650 MG CR tablet Take 650 mg by mouth every 8 (eight) hours as needed for pain.   Yes Historical Provider, MD  amitriptyline (ELAVIL) 50 MG tablet Take 1 tablet (50 mg total) by mouth at bedtime. 04/22/14  Yes Leone Haven, MD  carvedilol (COREG) 12.5 MG tablet TAKE ONE TABLET BY MOUTH TWICE DAILY WITH A MEAL 10/27/14  Yes Leone Haven, MD  cyclobenzaprine (FLEXERIL) 10 MG tablet Take 1 tablet (10 mg total) by mouth 3 (three) times daily as needed for muscle spasms. 03/20/14  Yes Leone Haven, MD  EQ LORATADINE 10 MG tablet TAKE ONE TABLET BY MOUTH ONCE DAILY 10/06/14  Yes Leone Haven, MD  hydrochlorothiazide (HYDRODIURIL) 25 MG tablet Take 1 tablet (25 mg total) by mouth daily. 04/22/14  Yes Leone Haven, MD  naproxen (NAPROSYN) 500 MG tablet Take 1 tablet (500 mg total) by mouth 2 (two) times daily as needed for mild pain, moderate pain or headache (  TAKE WITH MEALS.). 07/18/14  Yes Mercedes Camprubi-Soms, PA-C  omeprazole (PRILOSEC) 20 MG capsule TAKE ONE CAPSULE BY MOUTH ONCE DAILY 10/06/14  Yes Leone Haven, MD  PROVENTIL HFA 108 (90 BASE) MCG/ACT inhaler INHALE TWO PUFFS BY MOUTH EVERY 6 HOURS AS NEEDED FOR  WHEEZING  OR  SHORTNESS  OF  BREATH 10/17/14  Yes Leone Haven, MD    Allergies:  Allergies  Allergen Reactions  . Fluconazole Swelling    REACTION: Face swelling  . Robitussin (Alcohol Free) [Guaifenesin] Palpitations    Chest pain    History   Social History  . Marital Status: Married    Spouse Name: N/A  . Number of Children: N/A  .  Years of Education: N/A   Occupational History  . Not on file.   Social History Main Topics  . Smoking status: Former Smoker -- 0.25 packs/day for .5 years    Types: Cigarettes    Quit date: 01/27/2014  . Smokeless tobacco: Not on file  . Alcohol Use: No  . Drug Use: No  . Sexual Activity: Not on file   Other Topics Concern  . Not on file   Social History Narrative     Family History  Problem Relation Age of Onset  . Family history unknown: Yes     ROS:  Please see the history of present illness.   Negative except Constipation back pain and headaches  All other systems reviewed and negative.    Physical Exam:   Blood pressure 151/97, pulse 115, height 5\' 2"  (1.575 m), weight 233 lb 3.2 oz (105.779 kg). General: Well developed, well nourished female in no acute distress. Head: Normocephalic, atraumatic, sclera non-icteric, no xanthomas, nares are without discharge. EENT: normal Lymph Nodes:  none Back: without scoliosis/kyphosis  no CVA tendersness Neck: Negative for carotid bruits. JVD  8 cm Lungs: Clear bilaterally to auscultation without wheezes, rales, or rhonchi. Breathing is unlabored. Heart: Rapid but RR with S1 S2.  2/6 systolic murmur , rubs, or gallops appreciated. Abdomen: Soft, non-tender, non-distended with normoactive bowel sounds. No hepatomegaly. No rebound/guarding. No obvious abdominal masses. Msk:  Strength and tone appear normal for age. Extremities: No clubbing or cyanosis.  tr edema.  Distal pedal pulses are 2+ and equal bilaterally. Skin: Warm and Dry Neuro: Alert and oriented X 3. CN III-XII intact Grossly normal sensory and motor function . Psych:  Responds to questions appropriately with a normal affect.      Labs: Cardiac Enzymes No results for input(s): CKTOTAL, CKMB, TROPONINI in the last 72 hours. CBC Lab Results  Component Value Date   WBC 5.7 11/12/2014   HGB 12.5 11/12/2014   HCT 36.9 11/12/2014   MCV 88.1 11/12/2014   PLT 275  11/12/2014   PROTIME: No results for input(s): LABPROT, INR in the last 72 hours. Chemistry No results for input(s): NA, K, CL, CO2, BUN, CREATININE, CALCIUM, PROT, BILITOT, ALKPHOS, ALT, AST, GLUCOSE in the last 168 hours.  Invalid input(s): LABALBU Lipids Lab Results  Component Value Date   CHOL 209* 03/26/2014   HDL 70 03/26/2014   LDLCALC 127* 03/26/2014   TRIG 58 03/26/2014   BNP PRO B NATRIURETIC PEPTIDE (BNP)  Date/Time Value Ref Range Status  08/25/2007 04:00 AM <30.0  Final   Thyroid Function Tests: No results for input(s): TSH, T4TOTAL, T3FREE, THYROIDAB in the last 72 hours.  Invalid input(s): FREET3    Miscellaneous Lab Results  Component Value Date   DDIMER  11/25/2009  0.26        AT THE INHOUSE ESTABLISHED CUTOFF VALUE OF 0.48 ug/mL FEU, THIS ASSAY HAS BEEN DOCUMENTED IN THE LITERATURE TO HAVE A SENSITIVITY AND NEGATIVE PREDICTIVE VALUE OF AT LEAST 98 TO 99%.  THE TEST RESULT SHOULD BE CORRELATED WITH AN ASSESSMENT OF THE CLINICAL PROBABILITY OF DVT / VTE.    Radiology/Studies:  No results found.  EKG: supraventricular tachycardia probably sinus tachycardia based on P wave morphologies at a rate of 107 14/08/39 Axis XLVIII Nonspecific ST changes Inferior wall MI   Assessment and Plan: * Sinus tachycardia  Syncope  Hypertension  Dyspnea on exertion   Abnormal ECG   the patient has Had recurrent syncope and a long-standing history of presyncope all precipitated by abrupt standing. There has been associated with residual orthostatic intolerance. This history is most consistent with orthostatic hypotension and orthostatic syncope.  However, the issue is whether she has structural heart disease. Her  ECG is abnormal with the possibility of a prior inferior wall MI. This would drastically change our thinking as to syncope. Hence, Myoview scanning would be appropriate to look for evidence of a perfusion defect as well as to look for an  explanation for her dyspnea which could be an anginal equivalent.  Her dyspnea may also be related to her tachycardia. Electrocardiographically, the P waves appear to be consistent with sinus; an atrial tachycardia is difficult to exclude. We'll undertake a 48-hour Holter monitor to look for heart rate variation to see if this will help [place] make this distinction.  In addition, structural abnormalities within the heart could be contributing to her dyspnea; we will undertake an echo this will also allow [place] to look for evidence of pulmonary hypertension. She has sleep disorder breathing we will have some degree of right heart failure.   She needs a sleep stuyd which i will defer to PCP   Virl Axe

## 2014-11-21 NOTE — Patient Instructions (Signed)
Medication Instructions:  Your physician has recommended you make the following change in your medication:  1) STOP Carevedilol 2) START Metoprolol Succinate 50 twice daily  Labwork: None ordered  Testing/Procedures: Your physician has requested that you have an echocardiogram. Echocardiography is a painless test that uses sound waves to create images of your heart. It provides your doctor with information about the size and shape of your heart and how well your heart's chambers and valves are working. This procedure takes approximately one hour. There are no restrictions for this procedure.  Your physician has requested that you have a lexiscan myoview. For further information please visit [email or web address]. Please follow instruction sheet, as given.  Your physician has recommended that you wear a holter monitor. Holter monitors are medical devices that record the heart's electrical activity. Doctors most often use these monitors to diagnose arrhythmias. Arrhythmias are problems with the speed or rhythm of the heartbeat. The monitor is a small, portable device. You can wear one while you do your normal daily activities. This is usually used to diagnose what is causing palpitations/syncope (passing out).   Follow-Up: Your physician recommends that you schedule a follow-up appointment in: 4 weeks with Dr.  Caryl Comes   Any Other Special Instructions Will Be Listed Below (If Applicable).

## 2014-11-26 ENCOUNTER — Telehealth: Payer: Self-pay | Admitting: Family Medicine

## 2014-11-26 DIAGNOSIS — G4710 Hypersomnia, unspecified: Secondary | ICD-10-CM

## 2014-11-26 DIAGNOSIS — G4730 Sleep apnea, unspecified: Principal | ICD-10-CM

## 2014-11-26 NOTE — Telephone Encounter (Signed)
Called patient to discuss possible sleep study mentioned in cardiology note. She notes she snores at night. Breaths hard at night like trying to catch breath, though no apneic events. Very tired during the day. Falls asleep easily. Notes some palpitations during her sleep, though no CP or dyspnea with this. Notes she is scheduled for stress test, echo, and 48 hour holter monitor to further evaluate her heart issues. Notes she has MRI cervical spine scheduled for tomorrow. Notes neck pain had been doing ok until today when she tried to lift something with her head and noted a pain in her neck at that time radiating to her left head. No radiation down arms. No neurological issues with this. Will evaluate with MRI tomorrow as planned. Will order sleep study to be performed.

## 2014-11-27 ENCOUNTER — Ambulatory Visit (HOSPITAL_COMMUNITY)
Admission: RE | Admit: 2014-11-27 | Discharge: 2014-11-27 | Disposition: A | Discharge status: Home / Self Care | Payer: No Typology Code available for payment source | Source: Ambulatory Visit | Attending: Family Medicine | Admitting: Family Medicine

## 2014-11-27 DIAGNOSIS — M5022 Other cervical disc displacement, mid-cervical region: Secondary | ICD-10-CM | POA: Insufficient documentation

## 2014-11-27 DIAGNOSIS — M542 Cervicalgia: Secondary | ICD-10-CM

## 2014-11-27 DIAGNOSIS — E041 Nontoxic single thyroid nodule: Secondary | ICD-10-CM | POA: Insufficient documentation

## 2014-11-28 ENCOUNTER — Telehealth: Payer: Self-pay | Admitting: Family Medicine

## 2014-11-28 DIAGNOSIS — M47812 Spondylosis without myelopathy or radiculopathy, cervical region: Secondary | ICD-10-CM

## 2014-11-28 DIAGNOSIS — E041 Nontoxic single thyroid nodule: Secondary | ICD-10-CM

## 2014-11-28 NOTE — Telephone Encounter (Signed)
Called patient to discuss results of MRI study. Advised to call office back. Will await her call back.

## 2014-11-30 ENCOUNTER — Other Ambulatory Visit: Payer: Self-pay | Admitting: Family Medicine

## 2014-12-01 NOTE — Telephone Encounter (Signed)
It appears the cardiologist just changed this medication to metoprolol. Please inform the patient we will not refill this and she should start on metoprolol as just started by cardiology. Thanks.

## 2014-12-02 ENCOUNTER — Telehealth: Payer: Self-pay

## 2014-12-02 MED ORDER — CARVEDILOL 12.5 MG PO TABS: 12.5000 mg | ORAL_TABLET | Freq: Two times a day (BID) | ORAL | Status: DC

## 2014-12-02 NOTE — Telephone Encounter (Signed)
Spoke with patient and she is aware of medication change but when she called pharmacy it was $54.  She is unable to afford this and has contacted cardiology to let them know, but hasn't heard back. She is out of medication right now and is unsure if you could send a message to Dr. Caryl Comes regarding medication cost. Johnney Ou

## 2014-12-02 NOTE — Telephone Encounter (Signed)
It appears she just called them today. I would await there response. Metoprolol is on the $4 list at [facility], though not the long acting version they sent in. I would advise that she call back to their office this afternoon to check again. If she is unable to get in touch with them I can send Dr Caryl Comes a message.

## 2014-12-02 NOTE — Telephone Encounter (Signed)
Called patient back to see if she heard from Dr Olin Pia office. She states she has not. Given that they were not able to get back to her tonight. I will send in the patients prior prescription for coreg for a one month supply to give time to hear back from cardiology. I advised her to call Dr Olin Pia office tomorrow to discuss the cost of the medication he sent in. Advised that the coreg was sent to her pharmacy tonight.

## 2014-12-02 NOTE — Addendum Note (Signed)
Addended by: Leone Haven on: 12/02/2014 08:11 PM   Modules accepted: Orders

## 2014-12-02 NOTE — Telephone Encounter (Signed)
Called patient. Discussed results of MRI. Will refer to PM&R for consideration of injections for her neck pain. Also discussed thyroid nodule. Will obtain thyroid US.

## 2014-12-03 ENCOUNTER — Telehealth: Payer: Self-pay | Admitting: Family Medicine

## 2014-12-03 MED ORDER — METOPROLOL TARTRATE 25 MG PO TABS: 25.0000 mg | ORAL_TABLET | Freq: Two times a day (BID) | ORAL | Status: DC

## 2014-12-03 NOTE — Telephone Encounter (Signed)
Noted. Thanks.

## 2014-12-03 NOTE — Telephone Encounter (Signed)
Patient returning someones call, she did't know who it was, about metoprolol cost. Thanks, MI

## 2014-12-03 NOTE — Telephone Encounter (Signed)
Ms. Junker called to inform provider that her cardiologist called in her metoprolol.

## 2014-12-03 NOTE — Telephone Encounter (Signed)
Informed patient we would stop Succinate and begin Tartrate 25 mg BID.  Rx sent to [facility], per pt request. Patient verbalized understanding and agreeable to plan.

## 2014-12-06 ENCOUNTER — Encounter (HOSPITAL_COMMUNITY): Payer: Self-pay | Admitting: Emergency Medicine

## 2014-12-06 ENCOUNTER — Emergency Department (INDEPENDENT_AMBULATORY_CARE_PROVIDER_SITE_OTHER)
Admission: EM | Admit: 2014-12-06 | Discharge: 2014-12-06 | Disposition: A | Discharge status: Nursing Home (Includes ICF) | Payer: No Typology Code available for payment source | Source: Home / Self Care | Attending: Emergency Medicine | Admitting: Emergency Medicine

## 2014-12-06 ENCOUNTER — Inpatient Hospital Stay (HOSPITAL_COMMUNITY): Payer: Self-pay | Admitting: Certified Registered"

## 2014-12-06 ENCOUNTER — Emergency Department (HOSPITAL_COMMUNITY): Payer: Self-pay

## 2014-12-06 ENCOUNTER — Inpatient Hospital Stay (HOSPITAL_COMMUNITY)
Admission: EM | Admit: 2014-12-06 | Discharge: 2014-12-14 | DRG: 133 | Disposition: A | Discharge status: Home / Self Care | Payer: No Typology Code available for payment source | Attending: Family Medicine | Admitting: Family Medicine

## 2014-12-06 ENCOUNTER — Encounter (HOSPITAL_COMMUNITY)
Admission: EM | Disposition: A | Discharge status: Home / Self Care | Payer: Self-pay | Source: Home / Self Care | Attending: Family Medicine

## 2014-12-06 DIAGNOSIS — E669 Obesity, unspecified: Secondary | ICD-10-CM | POA: Diagnosis present

## 2014-12-06 DIAGNOSIS — J029 Acute pharyngitis, unspecified: Secondary | ICD-10-CM

## 2014-12-06 DIAGNOSIS — R9389 Abnormal findings on diagnostic imaging of other specified body structures: Secondary | ICD-10-CM | POA: Insufficient documentation

## 2014-12-06 DIAGNOSIS — Z79899 Other long term (current) drug therapy: Secondary | ICD-10-CM

## 2014-12-06 DIAGNOSIS — F331 Major depressive disorder, recurrent, moderate: Secondary | ICD-10-CM

## 2014-12-06 DIAGNOSIS — Z90710 Acquired absence of both cervix and uterus: Secondary | ICD-10-CM

## 2014-12-06 DIAGNOSIS — Z881 Allergy status to other antibiotic agents status: Secondary | ICD-10-CM

## 2014-12-06 DIAGNOSIS — E876 Hypokalemia: Secondary | ICD-10-CM | POA: Diagnosis present

## 2014-12-06 DIAGNOSIS — Z6841 Body Mass Index (BMI) 40.0 and over, adult: Secondary | ICD-10-CM

## 2014-12-06 DIAGNOSIS — I10 Essential (primary) hypertension: Secondary | ICD-10-CM | POA: Diagnosis present

## 2014-12-06 DIAGNOSIS — J95821 Acute postprocedural respiratory failure: Secondary | ICD-10-CM | POA: Diagnosis not present

## 2014-12-06 DIAGNOSIS — E860 Dehydration: Secondary | ICD-10-CM | POA: Diagnosis present

## 2014-12-06 DIAGNOSIS — J189 Pneumonia, unspecified organism: Secondary | ICD-10-CM | POA: Diagnosis not present

## 2014-12-06 DIAGNOSIS — J36 Peritonsillar abscess: Principal | ICD-10-CM | POA: Diagnosis present

## 2014-12-06 DIAGNOSIS — R1310 Dysphagia, unspecified: Secondary | ICD-10-CM

## 2014-12-06 DIAGNOSIS — J351 Hypertrophy of tonsils: Secondary | ICD-10-CM | POA: Diagnosis present

## 2014-12-06 DIAGNOSIS — N269 Renal sclerosis, unspecified: Secondary | ICD-10-CM | POA: Diagnosis present

## 2014-12-06 DIAGNOSIS — K219 Gastro-esophageal reflux disease without esophagitis: Secondary | ICD-10-CM | POA: Diagnosis present

## 2014-12-06 DIAGNOSIS — I248 Other forms of acute ischemic heart disease: Secondary | ICD-10-CM | POA: Diagnosis not present

## 2014-12-06 DIAGNOSIS — F419 Anxiety disorder, unspecified: Secondary | ICD-10-CM | POA: Diagnosis present

## 2014-12-06 DIAGNOSIS — J80 Acute respiratory distress syndrome: Secondary | ICD-10-CM | POA: Insufficient documentation

## 2014-12-06 DIAGNOSIS — Z7982 Long term (current) use of aspirin: Secondary | ICD-10-CM

## 2014-12-06 DIAGNOSIS — Z791 Long term (current) use of non-steroidal anti-inflammatories (NSAID): Secondary | ICD-10-CM

## 2014-12-06 DIAGNOSIS — F339 Major depressive disorder, recurrent, unspecified: Secondary | ICD-10-CM | POA: Diagnosis present

## 2014-12-06 DIAGNOSIS — J9690 Respiratory failure, unspecified, unspecified whether with hypoxia or hypercapnia: Secondary | ICD-10-CM

## 2014-12-06 DIAGNOSIS — Z905 Acquired absence of kidney: Secondary | ICD-10-CM | POA: Diagnosis present

## 2014-12-06 DIAGNOSIS — Z8711 Personal history of peptic ulcer disease: Secondary | ICD-10-CM

## 2014-12-06 DIAGNOSIS — Z888 Allergy status to other drugs, medicaments and biological substances status: Secondary | ICD-10-CM

## 2014-12-06 DIAGNOSIS — Z87891 Personal history of nicotine dependence: Secondary | ICD-10-CM

## 2014-12-06 DIAGNOSIS — R0902 Hypoxemia: Secondary | ICD-10-CM

## 2014-12-06 DIAGNOSIS — J392 Other diseases of pharynx: Secondary | ICD-10-CM | POA: Insufficient documentation

## 2014-12-06 DIAGNOSIS — J9601 Acute respiratory failure with hypoxia: Secondary | ICD-10-CM | POA: Insufficient documentation

## 2014-12-06 DIAGNOSIS — E8770 Fluid overload, unspecified: Secondary | ICD-10-CM | POA: Diagnosis not present

## 2014-12-06 HISTORY — PX: TONSILLECTOMY: SHX5217

## 2014-12-06 LAB — CBC WITH DIFFERENTIAL/PLATELET
BASOS PCT: 0 % (ref 0–1)
Basophils Absolute: 0 10*3/uL (ref 0.0–0.1)
EOS PCT: 0 % (ref 0–5)
Eosinophils Absolute: 0.1 10*3/uL (ref 0.0–0.7)
HCT: 36.5 % (ref 36.0–46.0)
Hemoglobin: 12.3 g/dL (ref 12.0–15.0)
Lymphocytes Relative: 6 % — ABNORMAL LOW (ref 12–46)
Lymphs Abs: 1.2 10*3/uL (ref 0.7–4.0)
MCH: 30.1 pg (ref 26.0–34.0)
MCHC: 33.7 g/dL (ref 30.0–36.0)
MCV: 89.2 fL (ref 78.0–100.0)
Monocytes Absolute: 1.5 10*3/uL — ABNORMAL HIGH (ref 0.1–1.0)
Monocytes Relative: 8 % (ref 3–12)
Neutro Abs: 16.3 10*3/uL — ABNORMAL HIGH (ref 1.7–7.7)
Neutrophils Relative %: 86 % — ABNORMAL HIGH (ref 43–77)
Platelets: 260 10*3/uL (ref 150–400)
RBC: 4.09 MIL/uL (ref 3.87–5.11)
RDW: 13.4 % (ref 11.5–15.5)
WBC: 19.1 10*3/uL — ABNORMAL HIGH (ref 4.0–10.5)

## 2014-12-06 LAB — POCT URINALYSIS DIP (DEVICE)
Glucose, UA: NEGATIVE mg/dL
KETONES UR: NEGATIVE mg/dL
Leukocytes, UA: NEGATIVE
NITRITE: NEGATIVE
Protein, ur: 100 mg/dL — AB
Specific Gravity, Urine: 1.025 (ref 1.005–1.030)
Urobilinogen, UA: >=8 mg/dL (ref 0.0–1.0)
pH: 6 (ref 5.0–8.0)

## 2014-12-06 LAB — I-STAT CHEM 8, ED
BUN: 14 mg/dL (ref 6–20)
CALCIUM ION: 1.09 mmol/L — AB (ref 1.12–1.23)
Chloride: 100 mmol/L — ABNORMAL LOW (ref 101–111)
Creatinine, Ser: 1 mg/dL (ref 0.44–1.00)
Glucose, Bld: 132 mg/dL — ABNORMAL HIGH (ref 65–99)
HEMATOCRIT: 37 % (ref 36.0–46.0)
Hemoglobin: 12.6 g/dL (ref 12.0–15.0)
Potassium: 2.9 mmol/L — ABNORMAL LOW (ref 3.5–5.1)
Sodium: 139 mmol/L (ref 135–145)
TCO2: 23 mmol/L (ref 0–100)

## 2014-12-06 SURGERY — TONSILLECTOMY
Anesthesia: General | Site: Throat | Laterality: Bilateral

## 2014-12-06 MED ORDER — LIDOCAINE-EPINEPHRINE 1 %-1:100000 IJ SOLN
20.0000 mL | Freq: Once | INTRAMUSCULAR | Status: AC
  Administered 2014-12-06: 20 mL
  Filled 2014-12-06: qty 1
  Filled 2014-12-06: qty 20

## 2014-12-06 MED ORDER — HYDROMORPHONE HCL 1 MG/ML IJ SOLN
1.0000 mg | INTRAMUSCULAR | Status: DC | PRN
  Administered 2014-12-07: 1 mg via INTRAVENOUS
  Filled 2014-12-06: qty 1

## 2014-12-06 MED ORDER — MIDAZOLAM HCL 2 MG/2ML IJ SOLN
INTRAMUSCULAR | Status: AC
  Filled 2014-12-06: qty 2

## 2014-12-06 MED ORDER — PROPOFOL 10 MG/ML IV BOLUS
INTRAVENOUS | Status: AC
  Filled 2014-12-06: qty 20

## 2014-12-06 MED ORDER — CLINDAMYCIN PALMITATE HCL 75 MG/5ML PO SOLR: 300.0000 mg | Freq: Four times a day (QID) | ORAL | Status: DC

## 2014-12-06 MED ORDER — POTASSIUM CHLORIDE 10 MEQ/100ML IV SOLN
10.0000 meq | INTRAVENOUS | Status: DC
  Administered 2014-12-06: 10 meq via INTRAVENOUS
  Filled 2014-12-06 (×2): qty 100

## 2014-12-06 MED ORDER — OXYCODONE HCL 5 MG/5ML PO SOLN: 5.0000 mg | Freq: Once | ORAL | Status: AC | PRN

## 2014-12-06 MED ORDER — PREDNISOLONE 15 MG/5ML PO SOLN: ORAL | Status: DC

## 2014-12-06 MED ORDER — POTASSIUM CHLORIDE 10 MEQ/100ML IV SOLN
10.0000 meq | Freq: Once | INTRAVENOUS | Status: AC
  Administered 2014-12-06: 10 meq via INTRAVENOUS
  Filled 2014-12-06: qty 100

## 2014-12-06 MED ORDER — SODIUM CHLORIDE 0.9 % IV BOLUS (SEPSIS)
1000.0000 mL | Freq: Once | INTRAVENOUS | Status: AC
  Administered 2014-12-06: 1000 mL via INTRAVENOUS

## 2014-12-06 MED ORDER — ONDANSETRON HCL 4 MG/2ML IJ SOLN: 4.0000 mg | Freq: Three times a day (TID) | INTRAMUSCULAR | Status: DC | PRN

## 2014-12-06 MED ORDER — HYDROCODONE-ACETAMINOPHEN 7.5-325 MG/15ML PO SOLN: 10.0000 mL | Freq: Once | ORAL | Status: DC

## 2014-12-06 MED ORDER — POTASSIUM CHLORIDE 20 MEQ PO PACK: 40.0000 meq | PACK | Freq: Two times a day (BID) | ORAL | Status: DC

## 2014-12-06 MED ORDER — MORPHINE SULFATE 4 MG/ML IJ SOLN
4.0000 mg | Freq: Once | INTRAMUSCULAR | Status: DC
Start: 2014-12-06 — End: 2014-12-06
  Filled 2014-12-06: qty 1

## 2014-12-06 MED ORDER — LACTATED RINGERS IV SOLN
INTRAVENOUS | Status: DC | PRN
  Administered 2014-12-06: via INTRAVENOUS

## 2014-12-06 MED ORDER — CLINDAMYCIN PHOSPHATE 900 MG/50ML IV SOLN
900.0000 mg | Freq: Once | INTRAVENOUS | Status: AC
  Administered 2014-12-06: 900 mg via INTRAVENOUS
  Filled 2014-12-06: qty 50

## 2014-12-06 MED ORDER — SUFENTANIL CITRATE 50 MCG/ML IV SOLN
INTRAVENOUS | Status: AC
  Filled 2014-12-06: qty 1

## 2014-12-06 MED ORDER — MORPHINE SULFATE 4 MG/ML IJ SOLN
4.0000 mg | Freq: Once | INTRAMUSCULAR | Status: AC
  Administered 2014-12-06: 4 mg via INTRAVENOUS

## 2014-12-06 MED ORDER — IOHEXOL 300 MG/ML  SOLN
100.0000 mL | Freq: Once | INTRAMUSCULAR | Status: AC | PRN
  Administered 2014-12-06: 100 mL via INTRAVENOUS

## 2014-12-06 MED ORDER — HYDROMORPHONE HCL 1 MG/ML IJ SOLN
0.2500 mg | INTRAMUSCULAR | Status: DC | PRN
Start: 2014-12-06 — End: 2014-12-07

## 2014-12-06 MED ORDER — OXYCODONE HCL 5 MG PO TABS: 5.0000 mg | ORAL_TABLET | Freq: Once | ORAL | Status: AC | PRN

## 2014-12-06 MED ORDER — HYDROCODONE-ACETAMINOPHEN 7.5-325 MG/15ML PO SOLN: ORAL | Status: DC

## 2014-12-06 MED ORDER — POTASSIUM CHLORIDE 20 MEQ PO PACK: 40.0000 meq | PACK | Freq: Once | ORAL | Status: DC

## 2014-12-06 MED ORDER — DEXAMETHASONE SODIUM PHOSPHATE 10 MG/ML IJ SOLN
10.0000 mg | Freq: Once | INTRAMUSCULAR | Status: AC
  Administered 2014-12-06: 10 mg via INTRAVENOUS
  Filled 2014-12-06: qty 1

## 2014-12-06 MED ORDER — PROMETHAZINE HCL 25 MG/ML IJ SOLN
6.2500 mg | INTRAMUSCULAR | Status: DC | PRN
  Filled 2014-12-06: qty 1

## 2014-12-06 MED ORDER — MORPHINE SULFATE 4 MG/ML IJ SOLN
4.0000 mg | Freq: Once | INTRAMUSCULAR | Status: AC
  Administered 2014-12-06: 4 mg via INTRAVENOUS
  Filled 2014-12-06: qty 1

## 2014-12-06 SURGICAL SUPPLY — 34 items
BLADE SURG 15 STRL LF DISP TIS (BLADE) IMPLANT
BLADE SURG 15 STRL SS (BLADE) ×3
CANISTER SUCTION 2500CC (MISCELLANEOUS) ×3 IMPLANT
CATH ROBINSON RED A/P 12FR (CATHETERS) ×2 IMPLANT
CLEANER TIP ELECTROSURG 2X2 (MISCELLANEOUS) ×3 IMPLANT
COAGULATOR SUCT 6 FR SWTCH (ELECTROSURGICAL) ×1
COAGULATOR SUCT SWTCH 10FR 6 (ELECTROSURGICAL) ×2 IMPLANT
CONT SPEC 4OZ CLIKSEAL STRL BL (MISCELLANEOUS) ×2 IMPLANT
DRAPE PROXIMA HALF (DRAPES) ×2 IMPLANT
ELECT COATED BLADE 2.86 ST (ELECTRODE) ×3 IMPLANT
ELECT REM PT RETURN 9FT ADLT (ELECTROSURGICAL) ×3
ELECTRODE REM PT RTRN 9FT ADLT (ELECTROSURGICAL) IMPLANT
GAUZE SPONGE 4X4 16PLY XRAY LF (GAUZE/BANDAGES/DRESSINGS) ×3 IMPLANT
GLOVE BIO SURGEON STRL SZ7.5 (GLOVE) ×2 IMPLANT
GLOVE BIOGEL PI IND STRL 8 (GLOVE) IMPLANT
GLOVE BIOGEL PI INDICATOR 8 (GLOVE) ×2
GLOVE SURG SS PI 7.5 STRL IVOR (GLOVE) ×3 IMPLANT
GOWN STRL REUS W/ TWL LRG LVL3 (GOWN DISPOSABLE) ×1 IMPLANT
GOWN STRL REUS W/TWL LRG LVL3 (GOWN DISPOSABLE) ×3
KIT BASIN OR (CUSTOM PROCEDURE TRAY) ×3 IMPLANT
KIT ROOM TURNOVER OR (KITS) ×3 IMPLANT
NS IRRIG 1000ML POUR BTL (IV SOLUTION) ×3 IMPLANT
PACK SURGICAL SETUP 50X90 (CUSTOM PROCEDURE TRAY) ×3 IMPLANT
PAD ARMBOARD 7.5X6 YLW CONV (MISCELLANEOUS) ×6 IMPLANT
PENCIL BUTTON HOLSTER BLD 10FT (ELECTRODE) ×3 IMPLANT
SPECIMEN JAR SMALL (MISCELLANEOUS) ×4 IMPLANT
SPONGE TONSIL 1 RF SGL (DISPOSABLE) ×2 IMPLANT
SYR BULB 3OZ (MISCELLANEOUS) ×3 IMPLANT
TOWEL OR 17X26 10 PK STRL BLUE (TOWEL DISPOSABLE) ×2 IMPLANT
TUBE CONNECTING 12'X1/4 (SUCTIONS) ×1
TUBE CONNECTING 12X1/4 (SUCTIONS) ×2 IMPLANT
TUBE SALEM SUMP 12R W/ARV (TUBING) ×2 IMPLANT
WATER STERILE IRR 1000ML POUR (IV SOLUTION) IMPLANT
YANKAUER SUCT BULB TIP NO VENT (SUCTIONS) ×3 IMPLANT

## 2014-12-06 NOTE — H&P (Signed)
[facility] Admission History and Physical Service Pager: [phone]  Patient name: Kristin [name] Medical record number: [id number] Date of birth: 07-19-1955 Age: 59 y.o. Gender: female  Primary Care Provider: Tommi Rumps, MD Consultants: ENT Code Status: FULL (discussed on admission)  Chief Complaint: difficulty swallowing, sore throat  Assessment and Plan: Kristin [name] is a 59 y.o. female presenting with difficulty swallowing . 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  HR 117-126 in ED.  No O2 requirement.  S/p Clindamycin x1.  CT neck with evidence of R palatine abscess 2.2x1.4cm and surrounding soft tissue edema.  I&D attempted in ED.  EKG with sinus tach and prolonged QTc 539 -Admit to SDU under Dr McDiarmid -telemetry -VS per floor protocol    -c/s ENT, appreciate assistance.  Would appreciate recommendations on antibiotic therapy and duration. -NPO for now -Dilaudid 0.5mg  q3 PRN severe pain.  Will transition to PO pain medication when able -Promethazine PRN Nausea  Tachycardia: has a h/o this.  Patient was scheduled to have echo and myoview done this upcoming week with Dr Caryl Comes.   EKG with sinus tach and prolonged QTc 539.  Metoprolol 25 mg BID at home -FYI cards in am.  Patient may need to reschedule cardiac evaluation -Metoprolol 2.5mg  q6, transition to oral once able  Hypokalemia: likely 2/2 decreased PO. K 2.9 in ED. On HCTZ at home. 60mEq of K IV given in ED.   -hold HCTZ for now. -K 86mEq added to IVFs, while NPO -BMET in am  HTN: normotensive in ED.  -Resume home Metoprolol in am (IV until able to tolerate PO) -Hold HCTZ for now  GERD: Prilosec at home -Protonix while hospitalized  Depression:  -Resume home Amitriptyline tomorrow night  Renal sclerosis: Cr at baseline on  admission (0.91-1.06).  Only has one kidney.  CT neck was performed with contrast. -BMET in am  FEN/GI: NPO will advance to clears in am if tolerates, PPI Prophylaxis: SCDs for now.  Disposition: Admit to SDU for close monitoring, on Bipap  History of Present Illness: Kristin [name] is a 59 y.o. female presenting with difficulty swallowing.  Patient reports that she has had increasing difficulty swallowing.  Symptoms began on Thursday.  She reports no injury but states that she has had a chronic cough since she stopped smoking 8 months ago.  She thought it was a smokers cough but as swallowing became more difficult, she remembered having had a tonsillar abscess about 10 or 15 years ago that presented similarly.  She reports a fever at home to 100F.  Denies SOB, voice change, drooling.  Endorses fatigue, dysphagia and decreased PO.  No sick contacts that she could think of.  We discussed her HR.  She reports that she is to be seen this upcoming week by Dr Caryl Comes and they are going to run some tests.  Denies CP, nausea, vomiting or dizziness currently.  Pt seen by Dr.Gore (ENT) and I&dprocedure was completed in the ED. There was reportedly no purulent fluid, given concerns for a retropharyngeal abscess. CT neck revealed a deep abscess, Dr. Simeon Craft was re-consulted and pt wen for tonsillectomy. Patient came out of surgery on BiPAP to 45M.  Review Of Systems: Per HPI with the following additions: none Otherwise 12 point review of systems was performed and was unremarkable.  Patient Active Problem List  Diagnosis Date Noted  . Cervical spine arthritis 11/14/2014  . Left hip pain 11/14/2014  . Loss of consciousness 11/14/2014  . Cough 05/26/2014  . Musculoskeletal chest and rib pain 03/26/2014  . Rib pain on right side 02/28/2014  . Groin pain 04/18/2013  . Tension headache 12/22/2012  . UNSPECIFIED RENAL SCLEROSIS 01/16/2009  . GERD, SEVERE 05/25/2007  . OBESITY NOS 04/03/2007  .  HYPERLIPIDEMIA 08/31/2006  . Major depression, recurrent 08/31/2006  . 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 Former smoker 08/31/2006  . HYPERTENSION, BENIGN SYSTEMIC 08/31/2006  . GASTRIC ULCER ACUTE WITHOUT HEMORRHAGE 08/31/2006  . CONVULSIONS, SEIZURES, NOS 08/31/2006   Past Medical History: Past Medical History  Diagnosis Date  . Back pain, chronic   . GERD (gastroesophageal reflux disease)   . Obesity   . Tobacco abuse   . Hypertension   . Hyperlipidemia   . Seizures   . Depression   . Somatization disorder   . Renal sclerosis, unspecified     only has 1 kidney   Past Surgical History: Past Surgical History  Procedure Laterality Date  . Tubal ligation    . Abdominal hysterectomy    . Cholecystectomy     Social History: History  Substance Use Topics  . Smoking status: Former Smoker -- 0.25 packs/day for .5 years    Types: Cigarettes    Quit date: 01/27/2014  . Smokeless tobacco: Not on file  . Alcohol Use: No   Additional social history: former smoker, no ETOH, no drugs  Please also refer to relevant sections of EMR.  Family History: Family History  Problem Relation Age of Onset  . Family history unknown: Yes   Allergies and Medications: Allergies  Allergen Reactions  . Fluconazole Swelling    REACTION: Face swelling  . Robitussin (Alcohol Free) [Guaifenesin] Palpitations    Chest pain   No current facility-administered medications on file prior to encounter.   Current Outpatient Prescriptions on File Prior to Encounter  Medication Sig Dispense Refill  . acetaminophen (TYLENOL) 650 MG CR tablet Take 650 mg by mouth every 8 (eight) hours as needed for pain.    Marland Kitchen amitriptyline (ELAVIL) 50 MG tablet Take 1 tablet (50 mg total) by mouth at bedtime. 30 tablet 1  . aspirin 81 MG tablet Take 81 mg by mouth daily.    . cyclobenzaprine (FLEXERIL) 10 MG tablet Take 1 tablet (10 mg total) by mouth 3 (three) times daily as needed for muscle spasms. 90 tablet 2  .  EQ LORATADINE 10 MG tablet TAKE ONE TABLET BY MOUTH ONCE DAILY 30 tablet 3  . metoprolol tartrate (LOPRESSOR) 25 MG tablet Take 1 tablet (25 mg total) by mouth 2 (two) times daily. 60 tablet 6  . naproxen (NAPROSYN) 500 MG tablet Take 1 tablet (500 mg total) by mouth 2 (two) times daily as needed for mild pain, moderate pain or headache (TAKE WITH MEALS.). 20 tablet 0  . omeprazole (PRILOSEC) 20 MG capsule TAKE ONE CAPSULE BY MOUTH ONCE DAILY 30 capsule 3  . PROVENTIL HFA 108 (90 BASE) MCG/ACT inhaler INHALE TWO PUFFS BY MOUTH EVERY 6 HOURS AS NEEDED FOR  WHEEZING  OR  SHORTNESS  OF  BREATH 2 each 1  . carvedilol (COREG) 12.5 MG tablet Take 1 tablet (12.5 mg total) by mouth 2 (two) times daily with a meal. 60 tablet 0  . hydrochlorothiazide (HYDRODIURIL) 25 MG tablet Take 1 tablet (25 mg total) by mouth daily. 90 tablet 3    Objective: BP  136/73 mmHg  Pulse 123  Temp(Src) 99.9 F (37.7 C) (Oral)  Resp 27  SpO2 96% Exam: General: awake, alert, pleasant, NAD, daughters at bedside Eyes: EOMI ENTM: Grade 3-4 airway, palate with some petechiae, MMM  Neck: full feeling throughout with mild TTP, +submandibular LAD Cardiovascular: tachycardic but rhythm regular, no murmurs, no thrills Respiratory: CTAB anteriorly, no increased WOB Abdomen: obese, NT/ND, +BS MSK: moves extremities independently Skin: no rashes appreciated Neuro: follows commands, no focal deficits Psych: mood stable, speech normal, good eye contact  Labs and Imaging: CBC BMET   Recent Labs Lab 12/06/14 1515 12/06/14 1946  WBC 19.1*  --   HGB 12.3 12.6  HCT 36.5 37.0  PLT 260  --     Recent Labs Lab 12/06/14 1946  NA 139  K 2.9*  CL 100*  BUN 14  CREATININE 1.00  GLUCOSE 132*     Dg Neck Soft Tissue  12/06/2014   CLINICAL DATA:  Sore throat.  Difficulty swallowing.  EXAM: NECK SOFT TISSUES - 1+ VIEW  COMPARISON:  MRI dated 11/27/2014 and cervical spine radiographs dated 02/27/2014  FINDINGS: There is  increased soft tissue prominence anterior to C2 and C3 as compared to the prior exams. Tongue base and epiglottis appear unchanged.  Prominent anterior osteophytes in the cervical spine from C3 through C7-T1. Adenoids are not enlarged.  IMPRESSION: Abnormal prevertebral soft tissue prominence at C2-3, new since the prior exams. The possibility of prevertebral abscess should be considered.   Electronically Signed   By: Lorriane Shire M.D.   On: 12/06/2014 15:19   Dg Chest 2 View  12/06/2014   CLINICAL DATA:  Sick for 1 week with throat pain, difficulty swallowing, loss of appetite, body aches, question throat abscess  EXAM: CHEST  2 VIEW  COMPARISON:  06/02/2014  FINDINGS: Normal heart size, mediastinal contours, and pulmonary vascularity.  Linear subsegmental atelectasis RIGHT middle lobe.  Lungs otherwise clear.  No pleural effusion or pneumothorax.  Scattered endplate spur formation thoracic spine.  IMPRESSION: Subsegmental atelectasis RIGHT middle lobe.   Electronically Signed   By: Lavonia Dana M.D.   On: 12/06/2014 15:17   Ct Soft Tissue Neck W Contrast  12/06/2014   CLINICAL DATA:  Acute onset of right-sided sore throat for 1 week, with difficulty swallowing. Initial encounter.  EXAM: CT NECK WITH CONTRAST  TECHNIQUE: Multidetector CT imaging of the neck was performed using the standard protocol following the bolus administration of intravenous contrast.  CONTRAST:  127mL OMNIPAQUE IOHEXOL 300 MG/ML  SOLN  COMPARISON:  MRI of the cervical spine performed 11/27/2014, and maxillofacial CT performed 12/14/2012  FINDINGS: Pharynx and larynx: Note is made of a 2.2 x 1.4 cm evolving abscess at the deep portion of the right palatine tonsil, with associated diffuse soft tissue edema and right-sided effacement of the upper hypopharynx. Soft tissue edema extends inferiorly to the level of the valleculae and piriform sinuses, with effacement of the right piriform sinus. Soft tissue edema also extends superiorly along  the right side of the oropharynx.  The nasopharynx is grossly unremarkable in appearance. The adenoids are within normal limits.  Soft tissue edema is noted along the prevertebral soft tissues, extending to the level of the inferior hypopharynx, with only minimal prevertebral thickening. Diffuse asymmetric inflammation is noted at the right parapharyngeal fat planes. The vocal cords are grossly unremarkable in appearance. The proximal trachea demonstrates mild tracheomalacia.  Salivary glands: There is mild asymmetric prominence of the right submandibular gland, with surrounding soft  tissue inflammation tracking about the submandibular gland and along the right side of the neck. There is minimal soft tissue stranding extending to the inferior floor of the mouth, without significant Ludwig's angina at this time.  Thyroid: The thyroid gland is unremarkable in appearance.  Lymph nodes: Mildly enlarged cervical nodes are noted bilaterally, measuring up to 1.3 cm in short axis. These are somewhat more prominent on the right.  Vascular: There is no evidence of vascular compromise. The visualized vasculature is grossly unremarkable in appearance.  Limited intracranial: The visualized portions of the brain are grossly unremarkable.  Visualized orbits: The visualized portions of the orbits are within normal limits.  Mastoids and visualized paranasal sinuses: The visualized paranasal sinuses and mastoid air cells are well-aerated.  Skeleton: No acute osseous abnormalities are seen. Diffuse anterior osteophytes are seen along the cervical spine, without significant disc space narrowing.  Upper chest: The visualized lung apices are clear. The superior mediastinum is grossly unremarkable in appearance.  IMPRESSION: 1. Evolving 2.2 x 1.4 cm abscess at the deep portion of the right palatine tonsil, with associated diffuse soft tissue edema and right-sided effacement of the upper hypopharynx. Soft tissue edema extends inferiorly to  the level of the valleculae and piriform sinuses, with effacement of the left piriform sinus. Soft tissue edema also extends superiorly along the right side of the oropharynx. 2. Prevertebral soft tissue edema extends to the inferior hypopharynx, with minimal prevertebral soft tissue thickening. Diffuse asymmetric soft tissue inflammation at the right parapharyngeal fat planes. 3. Mild asymmetric prominence of the right submandibular gland, with surrounding soft tissue inflammation tracking about the right side of the neck. Minimal soft tissue stranding extends to the inferior floor of the mouth, without significant Ludwig's angina at this time. 4. Mildly enlarged bilateral cervical nodes seen, measuring up to 1.3 cm in short axis. These are somewhat more prominent on the right. 5. Mild degenerative change noted along the cervical spine.  These results were called by telephone at the time of interpretation on 12/06/2014 at 10:18 pm to [facility] PA, who verbally acknowledged these results.   Electronically Signed   By: Garald Balding M.D.   On: 12/06/2014 22:21   Janora Norlander, DO 12/06/2014, 11:03 PM PGY-1, [facility] Intern pager: [phone], text pages welcome  Howard Pouch DO PGY3 [facility] I have examined and discussed the patient with the intern and FPTS/attending. Alterations to A/P are in blue to the interns note.

## 2014-12-06 NOTE — H&P (Signed)
12/06/2014  11:04 PM  Ruby Cola  PREOPERATIVE HISTORY AND PHYSICAL  CHIEF COMPLAINT: right peritonsillar abscess  HISTORY: This is a 59 year old who presented with right sore throat. The ER did not get any imaging and the patient is a difficult oral cavity exam due to gag reflex and mallampatti 3/4. Right peritonsillar incision and drainage did not reveal any pus. 6 to 7 hours after her I&D procedure the ER got a CT neck for her persistent right sore throat which showed an ~ 1 to 1.5cm inferior peritonsillar vs. Intratonsillar abscess so ER re-consulted ENT. The patient now presents for bilateral tonsillectomy.  Dr. Simeon Craft, [place] has discussed the risks (bleeding, poor PO intake, infection, anesthesia risks, etc.), benefits, and alternatives of this procedure. The patient understands the risks and would like to proceed with the procedure. The chances of success of the procedure are >50% and the patient understands this. I personally performed an examination of the patient within 24 hours of the procedure.  PAST MEDICAL HISTORY: Past Medical History  Diagnosis Date  . Back pain, chronic   . GERD (gastroesophageal reflux disease)   . Obesity   . Tobacco abuse   . Hypertension   . Hyperlipidemia   . Seizures   . Depression   . Somatization disorder   . Renal sclerosis, unspecified     only has 1 kidney    PAST SURGICAL HISTORY: Past Surgical History  Procedure Laterality Date  . Tubal ligation    . Abdominal hysterectomy    . Cholecystectomy      MEDICATIONS: No current facility-administered medications on file prior to encounter.   Current Outpatient Prescriptions on File Prior to Encounter  Medication Sig Dispense Refill  . acetaminophen (TYLENOL) 650 MG CR tablet Take 650 mg by mouth every 8 (eight) hours as needed for pain.    Marland Kitchen amitriptyline (ELAVIL) 50 MG tablet Take 1 tablet (50 mg total) by mouth at bedtime. 30 tablet 1  . aspirin 81 MG tablet Take 81 mg by mouth  daily.    . cyclobenzaprine (FLEXERIL) 10 MG tablet Take 1 tablet (10 mg total) by mouth 3 (three) times daily as needed for muscle spasms. 90 tablet 2  . EQ LORATADINE 10 MG tablet TAKE ONE TABLET BY MOUTH ONCE DAILY 30 tablet 3  . metoprolol tartrate (LOPRESSOR) 25 MG tablet Take 1 tablet (25 mg total) by mouth 2 (two) times daily. 60 tablet 6  . naproxen (NAPROSYN) 500 MG tablet Take 1 tablet (500 mg total) by mouth 2 (two) times daily as needed for mild pain, moderate pain or headache (TAKE WITH MEALS.). 20 tablet 0  . omeprazole (PRILOSEC) 20 MG capsule TAKE ONE CAPSULE BY MOUTH ONCE DAILY 30 capsule 3  . PROVENTIL HFA 108 (90 BASE) MCG/ACT inhaler INHALE TWO PUFFS BY MOUTH EVERY 6 HOURS AS NEEDED FOR  WHEEZING  OR  SHORTNESS  OF  BREATH 2 each 1  . carvedilol (COREG) 12.5 MG tablet Take 1 tablet (12.5 mg total) by mouth 2 (two) times daily with a meal. 60 tablet 0  . hydrochlorothiazide (HYDRODIURIL) 25 MG tablet Take 1 tablet (25 mg total) by mouth daily. 90 tablet 3    ALLERGIES: Allergies  Allergen Reactions  . Fluconazole Swelling    REACTION: Face swelling  . Robitussin (Alcohol Free) [Guaifenesin] Palpitations    Chest pain    SOCIAL HISTORY: History   Social History  . Marital Status: Married    Spouse Name: N/A  . Number  of Children: N/A  . Years of Education: N/A   Occupational History  . Not on file.   Social History Main Topics  . Smoking status: Former Smoker -- 0.25 packs/day for .5 years    Types: Cigarettes    Quit date: 01/27/2014  . Smokeless tobacco: Not on file  . Alcohol Use: No  . Drug Use: No  . Sexual Activity: Not on file   Other Topics Concern  . Not on file   Social History Narrative    FAMILY HISTORY:  Family History  Problem Relation Age of Onset  . Family history unknown: Yes    REVIEW OF SYSTEMS:  HEENT: sore throat, dysphagia, odynophagia, otherwise negative x 12 systems except per HPI  PHYSICAL EXAM:  GENERAL:  NAD VITAL  SIGNS:   Filed Vitals:   12/06/14 2300  BP:   Pulse: 118  Temp:   Resp: 17   SKIN:  Warm, dry HEENT: Mallampati class III/IV, 3+ tonsils with R>L anterior tonsillar edema, previous right I&D site NECK:  supple ABDOMEN:  soft MUSCULOSKELETAL: normal affect PSYCH:  Normal NEUROLOGIC:  CN 2-12 intact and symmetric  DIAGNOSTIC STUDIES: CT neck shows ~ 1 to 2 cm right peritonsillar abscess.  ASSESSMENT AND PLAN: Plan to proceed with bilateral tonsillectomy. Patient understands the risks, benefits, and alternatives. Informed, written consent signed, witnessed, and on chart. Family Medicine or Hospitalists to admit for post-op care and antibiotic given medical issues. 12/06/2014 11:04 PM Ruby Cola

## 2014-12-06 NOTE — ED Notes (Signed)
Pt here for further evaluation from UC. Pt. Stated, I've had a sore throat for a week and UC care said it was an abscess.

## 2014-12-06 NOTE — ED Notes (Signed)
Pt had jello and ice chips prior to ENT consult. Pt has been updated regarding NPO status. PA at bedside.

## 2014-12-06 NOTE — Anesthesia Preprocedure Evaluation (Addendum)
Anesthesia Evaluation  Patient identified by MRN, date of birth, ID band Patient awake    Reviewed: Allergy & Precautions, NPO status , Patient's Chart, lab work & pertinent test results, reviewed documented beta blocker date and time   Airway Mallampati: II  TM Distance: >3 FB Neck ROM: Full    Dental   Pulmonary former smoker,  breath sounds clear to auscultation        Cardiovascular hypertension, Pt. on medications and Pt. on home beta blockers Rhythm:Regular Rate:Normal     Neuro/Psych Seizures -, Well Controlled,  Depression    GI/Hepatic Neg liver ROS, PUD, GERD-  ,  Endo/Other  Morbid obesity  Renal/GU Renal diseaseOne kidney     Musculoskeletal  (+) Arthritis -,   Abdominal   Peds  Hematology negative hematology ROS (+)   Anesthesia Other Findings   Reproductive/Obstetrics                            Anesthesia Physical Anesthesia Plan  ASA: III  Anesthesia Plan: General   Post-op Pain Management:    Induction: Intravenous, Rapid sequence and Cricoid pressure planned  Airway Management Planned: Oral ETT  Additional Equipment:   Intra-op Plan:   Post-operative Plan: Extubation in OR  Informed Consent: I have reviewed the patients History and Physical, chart, labs and discussed the procedure including the risks, benefits and alternatives for the proposed anesthesia with the patient or authorized representative who has indicated his/her understanding and acceptance.   Dental advisory given  Plan Discussed with: CRNA  Anesthesia Plan Comments:         Anesthesia Quick Evaluation

## 2014-12-06 NOTE — ED Provider Notes (Signed)
CSN: [id number]     Arrival date & time 12/06/14  1325 History   First MD Initiated Contact with Patient 12/06/14 1349     Chief Complaint  Patient presents with  . Sore Throat  . Abscess     (Consider location/radiation/quality/duration/timing/severity/associated sxs/prior Treatment) HPI  Kristin [name] is a 59 y.o. female with PMH of peritonsillar abscess years ago that required drainage, HTN, dyslipidemia, renal sclerosis presenting with one week of right sided sore throat worse with swallowing with associated congestion and cough. Sputum at times thick and white. Sore throat pain is constant and worsening. No significant improvement with OTC cold medications. Pt developed fevers and chills Tmax 100.0 three days ago. Pt denies drooling or trismus and is able to take sips but prefers to spit due to the pain. Pt evaluated in urgent care with negative strep and sent here for further evaluation.   Past Medical History  Diagnosis Date  . Back pain, chronic   . GERD (gastroesophageal reflux disease)   . Obesity   . Tobacco abuse   . Hypertension   . Hyperlipidemia   . Seizures   . Depression   . Somatization disorder   . Renal sclerosis, unspecified     only has 1 kidney   Past Surgical History  Procedure Laterality Date  . Tubal ligation    . Abdominal hysterectomy    . Cholecystectomy     Family History  Problem Relation Age of Onset  . Family history unknown: Yes   History  Substance Use Topics  . Smoking status: Former Smoker -- 0.25 packs/day for .5 years    Types: Cigarettes    Quit date: 01/27/2014  . Smokeless tobacco: Not on file  . Alcohol Use: No   OB History    No data available     Review of Systems 10 Systems reviewed and are negative for acute change except as noted in the HPI.    Allergies  Fluconazole and Robitussin (alcohol free)  Home Medications   Prior to Admission medications   Medication Sig Start Date End Date Taking? Authorizing  Provider  acetaminophen (TYLENOL) 650 MG CR tablet Take 650 mg by mouth every 8 (eight) hours as needed for pain.   Yes Historical Provider, MD  amitriptyline (ELAVIL) 50 MG tablet Take 1 tablet (50 mg total) by mouth at bedtime. 04/22/14  Yes Leone Haven, MD  aspirin 81 MG tablet Take 81 mg by mouth daily.   Yes Historical Provider, MD  cyclobenzaprine (FLEXERIL) 10 MG tablet Take 1 tablet (10 mg total) by mouth 3 (three) times daily as needed for muscle spasms. 03/20/14  Yes Leone Haven, MD  EQ LORATADINE 10 MG tablet TAKE ONE TABLET BY MOUTH ONCE DAILY 10/06/14  Yes Leone Haven, MD  ibuprofen (ADVIL,MOTRIN) 200 MG tablet Take 200 mg by mouth every 6 (six) hours as needed for fever.   Yes Historical Provider, MD  metoprolol tartrate (LOPRESSOR) 25 MG tablet Take 1 tablet (25 mg total) by mouth 2 (two) times daily. 12/03/14  Yes Deboraha Sprang, MD  naproxen (NAPROSYN) 500 MG tablet Take 1 tablet (500 mg total) by mouth 2 (two) times daily as needed for mild pain, moderate pain or headache (TAKE WITH MEALS.). 07/18/14  Yes Mercedes Camprubi-Soms, PA-C  omeprazole (PRILOSEC) 20 MG capsule TAKE ONE CAPSULE BY MOUTH ONCE DAILY 10/06/14  Yes Leone Haven, MD  PROVENTIL HFA 108 (90 BASE) MCG/ACT inhaler INHALE TWO PUFFS BY MOUTH  EVERY 6 HOURS AS NEEDED FOR  WHEEZING  OR  SHORTNESS  OF  BREATH 10/17/14  Yes Leone Haven, MD  Pseudoephedrine-DM-GG-APAP [facility] MAX-D COLD & FLU PO) Take 30 mLs by mouth every 6 (six) hours as needed (cold symptoms).   Yes Historical Provider, MD  carvedilol (COREG) 12.5 MG tablet Take 1 tablet (12.5 mg total) by mouth 2 (two) times daily with a meal. 12/02/14   Leone Haven, MD  clindamycin (CLEOCIN) 75 MG/5ML solution Take 20 mLs (300 mg total) by mouth 4 (four) times daily. 12/06/14   Al Corpus, PA-C  hydrochlorothiazide (HYDRODIURIL) 25 MG tablet Take 1 tablet (25 mg total) by mouth daily. 04/22/14   Leone Haven, MD   HYDROcodone-acetaminophen (HYCET) 7.5-325 mg/15 ml solution One 93mL dose Q6 hours as needed for moderate to severe pain. 12/06/14   Al Corpus, PA-C  prednisoLONE (PRELONE) 15 MG/5ML SOLN 45mg  for first two doses on first two days, 30mg  for second two doses on 3rd and fourth day, 20mg  for third two doses for fifth and sixth day, 10mg  for fourth two doses for seventh and 8th day, 5 mg for two doses for last days 12/06/14   [place] Ellarie Picking, PA-C   BP 123/78 mmHg  Pulse 104  Temp(Src) 98.5 F (36.9 C) (Axillary)  Resp 25  Ht 5\' 2"  (1.575 m)  Wt 240 lb 15.4 oz (109.3 kg)  BMI 44.06 kg/m2  SpO2 95% Physical Exam  Constitutional: She appears well-developed and well-nourished. No distress.  HENT:  Head: Normocephalic and atraumatic.  No trismus with left uvula deviation and notable pus in oropharynx worse on right. Difficult exam due to higher class Mallampati score. No tongue swelling or swelling under tongue.  Eyes: Conjunctivae and EOM are normal. Right eye exhibits no discharge. Left eye exhibits no discharge.  Cardiovascular: Normal rate and regular rhythm.   Pulmonary/Chest: Effort normal and breath sounds normal. No respiratory distress. She has no wheezes.  Abdominal: Soft. Bowel sounds are normal. She exhibits no distension. There is no tenderness.  Neurological: She is alert. She exhibits normal muscle tone. Coordination normal.  Skin: Skin is warm and dry. She is not diaphoretic.  Nursing note and vitals reviewed.   ED Course  Procedures (including critical care time) Labs Review Labs Reviewed  CBC WITH DIFFERENTIAL/PLATELET - Abnormal; Notable for the following:    WBC 19.1 (*)    Neutrophils Relative % 86 (*)    Neutro Abs 16.3 (*)    Lymphocytes Relative 6 (*)    Monocytes Absolute 1.5 (*)    All other components within normal limits  CBC - Abnormal; Notable for the following:    WBC 17.1 (*)    Hemoglobin 11.6 (*)    HCT 35.4 (*)    All other components within  normal limits  I-STAT CHEM 8, ED - Abnormal; Notable for the following:    Potassium 2.9 (*)    Chloride 100 (*)    Glucose, Bld 132 (*)    Calcium, Ion 1.09 (*)    All other components within normal limits  MRSA PCR SCREENING  CULTURE, GROUP A STREP  BASIC METABOLIC PANEL  I-STAT CHEM 8, ED  SURGICAL PATHOLOGY    Imaging Review Dg Neck Soft Tissue  12/06/2014   CLINICAL DATA:  Sore throat.  Difficulty swallowing.  EXAM: NECK SOFT TISSUES - 1+ VIEW  COMPARISON:  MRI dated 11/27/2014 and cervical spine radiographs dated 02/27/2014  FINDINGS: There is increased soft tissue prominence  anterior to C2 and C3 as compared to the prior exams. Tongue base and epiglottis appear unchanged.  Prominent anterior osteophytes in the cervical spine from C3 through C7-T1. Adenoids are not enlarged.  IMPRESSION: Abnormal prevertebral soft tissue prominence at C2-3, new since the prior exams. The possibility of prevertebral abscess should be considered.   Electronically Signed   By: Lorriane Shire M.D.   On: 12/06/2014 15:19   Dg Chest 2 View  12/06/2014   CLINICAL DATA:  Sick for 1 week with throat pain, difficulty swallowing, loss of appetite, body aches, question throat abscess  EXAM: CHEST  2 VIEW  COMPARISON:  06/02/2014  FINDINGS: Normal heart size, mediastinal contours, and pulmonary vascularity.  Linear subsegmental atelectasis RIGHT middle lobe.  Lungs otherwise clear.  No pleural effusion or pneumothorax.  Scattered endplate spur formation thoracic spine.  IMPRESSION: Subsegmental atelectasis RIGHT middle lobe.   Electronically Signed   By: Lavonia Dana M.D.   On: 12/06/2014 15:17   Ct Soft Tissue Neck W Contrast  12/06/2014   CLINICAL DATA:  Acute onset of right-sided sore throat for 1 week, with difficulty swallowing. Initial encounter.  EXAM: CT NECK WITH CONTRAST  TECHNIQUE: Multidetector CT imaging of the neck was performed using the standard protocol following the bolus administration of intravenous  contrast.  CONTRAST:  115mL OMNIPAQUE IOHEXOL 300 MG/ML  SOLN  COMPARISON:  MRI of the cervical spine performed 11/27/2014, and maxillofacial CT performed 12/14/2012  FINDINGS: Pharynx and larynx: Note is made of a 2.2 x 1.4 cm evolving abscess at the deep portion of the right palatine tonsil, with associated diffuse soft tissue edema and right-sided effacement of the upper hypopharynx. Soft tissue edema extends inferiorly to the level of the valleculae and piriform sinuses, with effacement of the right piriform sinus. Soft tissue edema also extends superiorly along the right side of the oropharynx.  The nasopharynx is grossly unremarkable in appearance. The adenoids are within normal limits.  Soft tissue edema is noted along the prevertebral soft tissues, extending to the level of the inferior hypopharynx, with only minimal prevertebral thickening. Diffuse asymmetric inflammation is noted at the right parapharyngeal fat planes. The vocal cords are grossly unremarkable in appearance. The proximal trachea demonstrates mild tracheomalacia.  Salivary glands: There is mild asymmetric prominence of the right submandibular gland, with surrounding soft tissue inflammation tracking about the submandibular gland and along the right side of the neck. There is minimal soft tissue stranding extending to the inferior floor of the mouth, without significant Ludwig's angina at this time.  Thyroid: The thyroid gland is unremarkable in appearance.  Lymph nodes: Mildly enlarged cervical nodes are noted bilaterally, measuring up to 1.3 cm in short axis. These are somewhat more prominent on the right.  Vascular: There is no evidence of vascular compromise. The visualized vasculature is grossly unremarkable in appearance.  Limited intracranial: The visualized portions of the brain are grossly unremarkable.  Visualized orbits: The visualized portions of the orbits are within normal limits.  Mastoids and visualized paranasal sinuses: The  visualized paranasal sinuses and mastoid air cells are well-aerated.  Skeleton: No acute osseous abnormalities are seen. Diffuse anterior osteophytes are seen along the cervical spine, without significant disc space narrowing.  Upper chest: The visualized lung apices are clear. The superior mediastinum is grossly unremarkable in appearance.  IMPRESSION: 1. Evolving 2.2 x 1.4 cm abscess at the deep portion of the right palatine tonsil, with associated diffuse soft tissue edema and right-sided effacement of the upper  hypopharynx. Soft tissue edema extends inferiorly to the level of the valleculae and piriform sinuses, with effacement of the left piriform sinus. Soft tissue edema also extends superiorly along the right side of the oropharynx. 2. Prevertebral soft tissue edema extends to the inferior hypopharynx, with minimal prevertebral soft tissue thickening. Diffuse asymmetric soft tissue inflammation at the right parapharyngeal fat planes. 3. Mild asymmetric prominence of the right submandibular gland, with surrounding soft tissue inflammation tracking about the right side of the neck. Minimal soft tissue stranding extends to the inferior floor of the mouth, without significant Ludwig's angina at this time. 4. Mildly enlarged bilateral cervical nodes seen, measuring up to 1.3 cm in short axis. These are somewhat more prominent on the right. 5. Mild degenerative change noted along the cervical spine.  These results were called by telephone at the time of interpretation on 12/06/2014 at 10:18 pm to [facility] PA, who verbally acknowledged these results.   Electronically Signed   By: Garald Balding M.D.   On: 12/06/2014 22:21     EKG Interpretation   Date/Time:  Saturday December 06 2014 17:35:06 EDT Ventricular Rate:  122 PR Interval:  108 QRS Duration: 94 QT Interval:  378 QTC Calculation: 539 R Axis:   64 Text Interpretation:  Sinus tachycardia Inferior infarct, age  indeterminate Lateral leads are  also involved Prolonged QT interval  tachycardia new  Confirmed by YAO  MD, DAVID ([id number]) on 12/06/2014 7:09:48  PM       MDM   Final diagnoses:  Pharyngeal swelling  Peritonsillar cellulitis  Tonsillar abscess   Pt presenting with complaint of sore throat for one week, low grade fevers and cough. Temp 100.3 at urgent care and pt with persistent tachycardia. Exam concerning for peritonsillar abscess. No trismus and pt tolerating secretions well. Pt also with leukocytosis. Consult to ENT. Dr. Winfred Leeds spoke with Dr. Simeon Craft who will evaluate patient in the ED. Dr. Simeon Craft performed I and D at bedside. Pt states she feels better after pain medications. Pt given IV clindamycin and 1L saline and additional one ordered. Pt with persistent tachycardia and has been evaluated by cardiology with plan for Holter monitor in 4-5 days. Pt also with likely dehydration. Pt signed out to [facility] PA-C at shift change pending rehydration and plan for discharge per Dr. Simeon Craft with liquid prednisolone, hydrocodone, clindamycin.  This is a shared patient. This patient was discussed with the physician who saw and evaluated the patient and agrees with the plan.   Al Corpus, PA-C 12/07/14 Katie, PA-C 12/07/14 [place], MD 12/07/14 [phone]

## 2014-12-06 NOTE — ED Provider Notes (Signed)
CSN: [id number]     Arrival date & time 12/06/14  1041 History   First MD Initiated Contact with Patient 12/06/14 1227     Chief Complaint  Patient presents with  . URI  . Urinary Tract Infection   (Consider location/radiation/quality/duration/timing/severity/associated sxs/prior Treatment) HPI  She is a 59 year old woman here for evaluation of sore throat. She states her symptoms started about a week ago with a head cold. She describes a sore throat, nasal congestion, and cough. On Thursday, she started having fevers and chills. Yesterday, she started having more difficulty swallowing. She is able to tolerate small sips of liquids. However, she has some pain, but she is spitting rather than swallowing her saliva. She reports decreased appetite yesterday. Last night, she noticed some dark urine. This morning, she is complaining of some mild dysuria. She has been taking TheraFlu and Alka-Seltzer cold without improvement. She reports a history of peritonsillar abscesses that needed to be drained.  Past Medical History  Diagnosis Date  . Back pain, chronic   . GERD (gastroesophageal reflux disease)   . Obesity   . Tobacco abuse   . Hypertension   . Hyperlipidemia   . Seizures   . Depression   . Somatization disorder   . Renal sclerosis, unspecified     only has 1 kidney   Past Surgical History  Procedure Laterality Date  . Tubal ligation    . Abdominal hysterectomy    . Cholecystectomy     Family History  Problem Relation Age of Onset  . Family history unknown: Yes   History  Substance Use Topics  . Smoking status: Former Smoker -- 0.25 packs/day for .5 years    Types: Cigarettes    Quit date: 01/27/2014  . Smokeless tobacco: Not on file  . Alcohol Use: No   OB History    No data available     Review of Systems As in history of present illness Allergies  Fluconazole and Robitussin (alcohol free)  Home Medications   Prior to Admission medications   Medication Sig  Start Date End Date Taking? Authorizing Provider  amitriptyline (ELAVIL) 50 MG tablet Take 1 tablet (50 mg total) by mouth at bedtime. 04/22/14  Yes Leone Haven, MD  aspirin 81 MG tablet Take 81 mg by mouth daily.   Yes Historical Provider, MD  EQ LORATADINE 10 MG tablet TAKE ONE TABLET BY MOUTH ONCE DAILY 10/06/14  Yes Leone Haven, MD  metoprolol tartrate (LOPRESSOR) 25 MG tablet Take 1 tablet (25 mg total) by mouth 2 (two) times daily. 12/03/14  Yes Deboraha Sprang, MD  omeprazole (PRILOSEC) 20 MG capsule TAKE ONE CAPSULE BY MOUTH ONCE DAILY 10/06/14  Yes Leone Haven, MD  acetaminophen (TYLENOL) 650 MG CR tablet Take 650 mg by mouth every 8 (eight) hours as needed for pain.    Historical Provider, MD  carvedilol (COREG) 12.5 MG tablet Take 1 tablet (12.5 mg total) by mouth 2 (two) times daily with a meal. 12/02/14   Leone Haven, MD  cyclobenzaprine (FLEXERIL) 10 MG tablet Take 1 tablet (10 mg total) by mouth 3 (three) times daily as needed for muscle spasms. 03/20/14   Leone Haven, MD  hydrochlorothiazide (HYDRODIURIL) 25 MG tablet Take 1 tablet (25 mg total) by mouth daily. 04/22/14   Leone Haven, MD  naproxen (NAPROSYN) 500 MG tablet Take 1 tablet (500 mg total) by mouth 2 (two) times daily as needed for mild pain, moderate pain or  headache (TAKE WITH MEALS.). 07/18/14   Mercedes Camprubi-Soms, PA-C  PROVENTIL HFA 108 (90 BASE) MCG/ACT inhaler INHALE TWO PUFFS BY MOUTH EVERY 6 HOURS AS NEEDED FOR  WHEEZING  OR  SHORTNESS  OF  BREATH 10/17/14   Leone Haven, MD   BP 131/84 mmHg  Pulse 121  Temp(Src) 100.3 F (37.9 C) (Oral)  Resp 20  SpO2 98% Physical Exam  Constitutional: She is oriented to person, place, and time. She appears well-developed and well-nourished. She appears distressed.  HENT:  I'm unable to visualize her tonsils due to gag reflex. However, the right soft palate is erythematous and swollen with exudate present.  Neck: Neck supple.   Cardiovascular: Regular rhythm and normal heart sounds.   No murmur heard. She is tachycardic  Pulmonary/Chest: Effort normal and breath sounds normal. No respiratory distress. She has no wheezes. She has no rales.  Lymphadenopathy:    She has no cervical adenopathy.  Neurological: She is alert and oriented to person, place, and time.    ED Course  Procedures (including critical care time) Labs Review Labs Reviewed  POCT URINALYSIS DIP (DEVICE) - Abnormal; Notable for the following:    Bilirubin Urine MODERATE (*)    Hgb urine dipstick TRACE (*)    Protein, ur 100 (*)    All other components within normal limits  URINE CULTURE    Imaging Review No results found.   MDM   1. Sore throat   2. Difficulty swallowing    No trismus, but she is having difficulty managing her secretions. Exam is also concerning for developing abscess in the peritonsillar area. Rapid strep is negative. We'll transfer to the ER for additional evaluation for possible peritonsillar abscess. I sent urine for culture.    Melony Overly, MD 12/06/14 1308

## 2014-12-06 NOTE — ED Provider Notes (Signed)
4:15 PM feels improved after incision and drainage noted in the emergency department. Patient reports that she has a baseline tachycardia for which she is being followed by cardiology service Dr.Klein. She's also likely dehydrated. She has been eating and drinking less over the past week due to pain on swallowing. Patient feels she can be discharged to home following treatment here  Orlie Dakin, MD 12/06/14 1721

## 2014-12-06 NOTE — ED Notes (Signed)
C/o cold sx onset 1 week associated w/sore throat, fevers, chills, cough Also c/o UTI sx onset 2 days associated w/dysuria and dark urine Alert, no signs of acute distress.

## 2014-12-06 NOTE — Consult Note (Addendum)
Kristin [name], Kristin [name] [id number] 11-20-1955 Orlie Dakin, MD  Reason for Consult: acute pharyngitis, possible right peritonsillar abscess  HPI: 59yo AAF with history of peritonsillar abscess drainage ~ 12 years ago with several days of R>L sore throat and dysphagia/odynophagia. ER felt patient had peritonsillar abscess so ENT was consulted.  Allergies:  Allergies  Allergen Reactions  . Fluconazole Swelling    REACTION: Face swelling  . Robitussin (Alcohol Free) [Guaifenesin] Palpitations    Chest pain    ROS: Review of systems + for right sore throat, odynophagia, dysphagia, otherwise negative x 12 systems except per HPI.  PMH:  Past Medical History  Diagnosis Date  . Back pain, chronic   . GERD (gastroesophageal reflux disease)   . Obesity   . Tobacco abuse   . Hypertension   . Hyperlipidemia   . Seizures   . Depression   . Somatization disorder   . Renal sclerosis, unspecified     only has 1 kidney    FH:  Family History  Problem Relation Age of Onset  . Family history unknown: Yes    SH:  History   Social History  . Marital Status: Married    Spouse Name: N/A  . Number of Children: N/A  . Years of Education: N/A   Occupational History  . Not on file.   Social History Main Topics  . Smoking status: Former Smoker -- 0.25 packs/day for .5 years    Types: Cigarettes    Quit date: 01/27/2014  . Smokeless tobacco: Not on file  . Alcohol Use: No  . Drug Use: No  . Sexual Activity: Not on file   Other Topics Concern  . Not on file   Social History Narrative    PSH:  Past Surgical History  Procedure Laterality Date  . Tubal ligation    . Abdominal hysterectomy    . Cholecystectomy      Physical  Exam: CN 2-12 grossly intact and symmetric. Normal voice. EAC/TMs normal BL. Mallampati 3/4. Oral cavity with Freidman 3+ tonsils with exudate and erythema R>L. Uvula mildly deviated to left with some R>L soft and hard palate edema. Skin warm and dry.  External nose and ears without masses or lesions. EOMI, PERRLA. Neck supple  Procedure Note: [id number], incision and drainage of right peritonsillar abscess. Informed verbal consent was obtained after explaining the risks (including bleeding and infection), benefits and alternatives of the procedure. Verbal timeout was performed prior to the procedure. The oral cavity was anesthetized using topical lidocaine followed by injected 1% lidocaine with epinephrine. A needle and syringe were used to aspirate the right peritonsillar/soft palate area. Multiple passes with 18 gauge needle did not aspirate any pus, only minimal sanguinous drainage. The 15 blade was then used to incise the right palatoglossus and mucosa and a curved hemostat was used to widely open the right peritonsillar area. Again, no purulent fluid was seen. The patient tolerated the procedure with no immediate complications.  A/P: right peritonsillar cellulitis/acute tonsillitis. Patient needs LIQUID hydrocodone/acetaminophen, LIQUID clindamycin 300mg  PO QID x 14 days, and LIQUID prednisolone 45mg  taper. Should drink plenty of fluids and needs to follow up at [facility] to schedule outpatient tonsillectomy once her acute infection has resolved in ~ 2 weeks.   Ruby Cola 12/06/2014 4:29 PM

## 2014-12-06 NOTE — ED Provider Notes (Signed)
Care assumed from [place], [place].  Kristin [name] is a 59 y.o. female presents with right sided throat pain worse with swallowing.  Pt with low grade fever on arrival.  Pt seen at [facility] prior to presentation in the ED.  Right oropharynx swollen with midline uvula on initial PE with [place].  Handling secretions.  Pt seen by ENT with I&D of peritonsillar abscess with plan for office follow-up in several weeks.  Per Dr. Simeon Craft, d/c with liquid prednisolone, clindamycin and hydrocodone.      Physical Exam  BP 142/75 mmHg  Pulse 123  Temp(Src) 99.9 F (37.7 C) (Oral)  Resp 18  SpO2 97%  Physical Exam   Face to face Exam:   General: Awake  HEENT: Atraumatic, significant swelling of the right neck and submandibular tissue with tenderness to palpation; bilateral tonsils with exudate and erythema right greater than left with mild uvula deviation to the left and evidence of edema of the right soft palate; handling secretions  Resp: Normal effort  Cardiac: tachycardia Abd: Nondistended  Neuro:No focal weakness    Results for orders placed or performed during the hospital encounter of 12/06/14  CBC with Differential  Result Value Ref Range   WBC 19.1 (H) 4.0 - 10.5 K/uL   RBC 4.09 3.87 - 5.11 MIL/uL   Hemoglobin 12.3 12.0 - 15.0 g/dL   HCT 36.5 36.0 - 46.0 %   MCV 89.2 78.0 - 100.0 fL   MCH 30.1 26.0 - 34.0 pg   MCHC 33.7 30.0 - 36.0 g/dL   RDW 13.4 11.5 - 15.5 %   Platelets 260 150 - 400 K/uL   Neutrophils Relative % 86 (H) 43 - 77 %   Neutro Abs 16.3 (H) 1.7 - 7.7 K/uL   Lymphocytes Relative 6 (L) 12 - 46 %   Lymphs Abs 1.2 0.7 - 4.0 K/uL   Monocytes Relative 8 3 - 12 %   Monocytes Absolute 1.5 (H) 0.1 - 1.0 K/uL   Eosinophils Relative 0 0 - 5 %   Eosinophils Absolute 0.1 0.0 - 0.7 K/uL   Basophils Relative 0 0 - 1 %   Basophils Absolute 0.0 0.0 - 0.1 K/uL  I-stat chem 8, ed  Result Value Ref Range   Sodium 139 135 - 145 mmol/L   Potassium 2.9 (L) 3.5 - 5.1 mmol/L    Chloride 100 (L) 101 - 111 mmol/L   BUN 14 6 - 20 mg/dL   Creatinine, Ser 1.00 0.44 - 1.00 mg/dL   Glucose, Bld 132 (H) 65 - 99 mg/dL   Calcium, Ion 1.09 (L) 1.12 - 1.23 mmol/L   TCO2 23 0 - 100 mmol/L   Hemoglobin 12.6 12.0 - 15.0 g/dL   HCT 37.0 36.0 - 46.0 %   Dg Neck Soft Tissue  12/06/2014   CLINICAL DATA:  Sore throat.  Difficulty swallowing.  EXAM: NECK SOFT TISSUES - 1+ VIEW  COMPARISON:  MRI dated 11/27/2014 and cervical spine radiographs dated 02/27/2014  FINDINGS: There is increased soft tissue prominence anterior to C2 and C3 as compared to the prior exams. Tongue base and epiglottis appear unchanged.  Prominent anterior osteophytes in the cervical spine from C3 through C7-T1. Adenoids are not enlarged.  IMPRESSION: Abnormal prevertebral soft tissue prominence at C2-3, new since the prior exams. The possibility of prevertebral abscess should be considered.   Electronically Signed   By: Lorriane Shire M.D.   On: 12/06/2014 15:19   Dg Chest 2 View  12/06/2014  CLINICAL DATA:  Sick for 1 week with throat pain, difficulty swallowing, loss of appetite, body aches, question throat abscess  EXAM: CHEST  2 VIEW  COMPARISON:  06/02/2014  FINDINGS: Normal heart size, mediastinal contours, and pulmonary vascularity.  Linear subsegmental atelectasis RIGHT middle lobe.  Lungs otherwise clear.  No pleural effusion or pneumothorax.  Scattered endplate spur formation thoracic spine.  IMPRESSION: Subsegmental atelectasis RIGHT middle lobe.   Electronically Signed   By: Lavonia Dana M.D.   On: 12/06/2014 15:17   Ct Soft Tissue Neck W Contrast  12/06/2014   CLINICAL DATA:  Acute onset of right-sided sore throat for 1 week, with difficulty swallowing. Initial encounter.  EXAM: CT NECK WITH CONTRAST  TECHNIQUE: Multidetector CT imaging of the neck was performed using the standard protocol following the bolus administration of intravenous contrast.  CONTRAST:  174mL OMNIPAQUE IOHEXOL 300 MG/ML  SOLN  COMPARISON:   MRI of the cervical spine performed 11/27/2014, and maxillofacial CT performed 12/14/2012  FINDINGS: Pharynx and larynx: Note is made of a 2.2 x 1.4 cm evolving abscess at the deep portion of the right palatine tonsil, with associated diffuse soft tissue edema and right-sided effacement of the upper hypopharynx. Soft tissue edema extends inferiorly to the level of the valleculae and piriform sinuses, with effacement of the right piriform sinus. Soft tissue edema also extends superiorly along the right side of the oropharynx.  The nasopharynx is grossly unremarkable in appearance. The adenoids are within normal limits.  Soft tissue edema is noted along the prevertebral soft tissues, extending to the level of the inferior hypopharynx, with only minimal prevertebral thickening. Diffuse asymmetric inflammation is noted at the right parapharyngeal fat planes. The vocal cords are grossly unremarkable in appearance. The proximal trachea demonstrates mild tracheomalacia.  Salivary glands: There is mild asymmetric prominence of the right submandibular gland, with surrounding soft tissue inflammation tracking about the submandibular gland and along the right side of the neck. There is minimal soft tissue stranding extending to the inferior floor of the mouth, without significant Ludwig's angina at this time.  Thyroid: The thyroid gland is unremarkable in appearance.  Lymph nodes: Mildly enlarged cervical nodes are noted bilaterally, measuring up to 1.3 cm in short axis. These are somewhat more prominent on the right.  Vascular: There is no evidence of vascular compromise. The visualized vasculature is grossly unremarkable in appearance.  Limited intracranial: The visualized portions of the brain are grossly unremarkable.  Visualized orbits: The visualized portions of the orbits are within normal limits.  Mastoids and visualized paranasal sinuses: The visualized paranasal sinuses and mastoid air cells are well-aerated.   Skeleton: No acute osseous abnormalities are seen. Diffuse anterior osteophytes are seen along the cervical spine, without significant disc space narrowing.  Upper chest: The visualized lung apices are clear. The superior mediastinum is grossly unremarkable in appearance.  IMPRESSION: 1. Evolving 2.2 x 1.4 cm abscess at the deep portion of the right palatine tonsil, with associated diffuse soft tissue edema and right-sided effacement of the upper hypopharynx. Soft tissue edema extends inferiorly to the level of the valleculae and piriform sinuses, with effacement of the left piriform sinus. Soft tissue edema also extends superiorly along the right side of the oropharynx. 2. Prevertebral soft tissue edema extends to the inferior hypopharynx, with minimal prevertebral soft tissue thickening. Diffuse asymmetric soft tissue inflammation at the right parapharyngeal fat planes. 3. Mild asymmetric prominence of the right submandibular gland, with surrounding soft tissue inflammation tracking about the right side  of the neck. Minimal soft tissue stranding extends to the inferior floor of the mouth, without significant Ludwig's angina at this time. 4. Mildly enlarged bilateral cervical nodes seen, measuring up to 1.3 cm in short axis. These are somewhat more prominent on the right. 5. Mild degenerative change noted along the cervical spine.  These results were called by telephone at the time of interpretation on 12/06/2014 at 10:18 pm to [facility], who verbally acknowledged these results.   Electronically Signed   By: Garald Balding M.D.   On: 12/06/2014 22:21   Mr Cervical Spine Wo Contrast  11/27/2014   CLINICAL DATA:  Neck pain, radiates sella side of the head and shoulder area.  EXAM: MRI CERVICAL SPINE WITHOUT CONTRAST  TECHNIQUE: Multiplanar, multisequence MR imaging of the cervical spine was performed. No intravenous contrast was administered.  COMPARISON:  None.  FINDINGS: The cervical cord is normal in  size and signal. Vertebral body heights are maintained. The disc spaces are preserved. There are anterior bridging osteophytes at C3-4, C4-5, C5-6 and C6-7. The cervical spine is normal in lordotic alignment. No static listhesis. Bone marrow signal is normal. Cerebellar tonsils are normal in position. There is a 12 mm right thyroid nodule of indeterminate etiology.  C2-3: No significant disc bulge. No neural foraminal stenosis. No central canal stenosis.  C3-4: No significant disc bulge. No neural foraminal stenosis. No central canal stenosis.  C4-5: Mild broad-based disc bulge. No neural foraminal stenosis. No central canal stenosis.  C5-6: No significant disc bulge. No neural foraminal stenosis. No central canal stenosis.  C6-7: Small central disc protrusion. No neural foraminal stenosis. No central canal stenosis.  C7-T1: No significant disc bulge. No neural foraminal stenosis. No central canal stenosis.  IMPRESSION: 1. At C4-5 there is a mild broad-based disc bulge. 2. At C6-7 there is a small central disc protrusion. 3. 12 mm right thyroid nodule of indeterminate etiology. If there is further clinical concern, recommend a dedicated thyroid ultrasound.   Electronically Signed   By: Kathreen Devoid   On: 11/27/2014 16:12    CRITICAL CARE Performed by: Abigail Butts Total critical care time: 1 hour Critical care time was exclusive of separately billable procedures and treating other patients. Critical care was necessary to treat or prevent imminent or life-threatening deterioration. Critical care was time spent personally by me on the following activities: development of treatment plan with patient and/or surrogate as well as nursing, discussions with consultants, evaluation of patient's response to treatment, examination of patient, obtaining history from patient or surrogate, ordering and performing treatments and interventions, ordering and review of laboratory studies, ordering and review of  radiographic studies, pulse oximetry and re-evaluation of patient's condition.    ED Course  Procedures  1. Peritonsillar cellulitis   2. Pharyngeal swelling   3. Tonsillar abscess     MDM Kristin [name] presents with concern for right peritonsilar abscess drained by ENT.    Pt with persistent tachycardia unresponsive to fluids.  Hx of same with cards evaluation.    Plan:  Will give an additional 2L of saline and reassess.  Pt assessed by Dr. Winfred Leeds at sign-out.    7:09 PM Pt with continued tachycardia.  She reports feeling of throat closing when reclining.  Review of labs and imaging shows abnormal prevertebral soft tissue prominence at C2-3.  Dr. Theressa Millard note reports that there was no purulent fluid on his I&D of her Presumed Peritonsillar Abscess.  This gives rise to concern about  a possible retropharyngeal abscess for me. Will obtain CT neck soft tissue.  10:26 PM CT neck soft tissue with 2 x 2 by 1.4 center abscess of the deep portion of the right palatine tonsil with associated diffuse soft tissue edema and right-sided effacement of the upper hypopharynx. Soft tissue edema extends inferiorly to the level of the vallecula and perform sinuses with effacement of the left perform sinus.  Prevertebral soft tissue edema extends to the inferior hypopharynx.  Soft tissue inflammation tracks about the right side of the neck from the right submandibular gland with soft tissue stranding to the inferior floor of the mouth without significant Ludwig angina at this time.    We reconsult Dr. Simeon Craft.    The patient was discussed with and seen by Dr. Darl Householder who agrees with the treatment plan.  11:07 PM Discussed with Dr. Simeon Craft who will take patient to the operating room tonight.  Pt admitted to [facility] to step-down bed.  She remains tachycardic in the 120s.     Kristin Soho Kay Shippy, PA-C 12/06/14 2308  Wandra Arthurs, MD 12/06/14 [phone]

## 2014-12-06 NOTE — ED Provider Notes (Signed)
[place] of sore throat, right side pain worse with swallowing onset of prostate one week ago. Other associated symptoms include mild cough Maximum temperature 100. No treatment prior to coming here. Seen at [facility] prior to coming here. Sent here for further evaluation on exam alert handling secretions well. Oral pharynx reddened. Appear swollen at right oropharynx. Uvula midline. Neck supple. Tender right-sided anterior cervical lymphadenopathy. Spoke with Dr. Simeon Craft who will come to evaluate pt in the ED  Orlie Dakin, MD 12/06/14 1550

## 2014-12-06 NOTE — Discharge Instructions (Signed)
Return to the emergency room with worsening of symptoms, new symptoms or with symptoms that are concerning, especially fevers, unable to open mouth fully, drooling, unable to drink fluids, chest pain, shortness of breath, general unwell feeling, faint or pass out. Please take all of your antibiotics until finished!   You may develop abdominal discomfort or diarrhea from the antibiotic.  You may help offset this with probiotics which you can buy or get in yogurt. Do not eat  or take the probiotics until 2 hours after your antibiotic.  hycet for pain. Do not operate machinery, drive or drink alcohol while taking narcotics including hycet or muscle relaxers. Please call your doctor for a followup appointment within 24-48 hours. When you talk to your doctor please let them know that you were seen in the emergency department and have them acquire all of your records so that they can discuss the findings with you and formulate a treatment plan to fully care for your new and ongoing problems.  Read below information and follow recommendations.  Peritonsillar Abscess A peritonsillar abscess is a collection of pus located in the back of the throat behind the tonsils. It usually occurs when a streptococcal infection of the throat or tonsils spreads into the space around the tonsils. They are almost always caused by the streptococcal germ (bacteria). The treatment of a peritonsillar abscess is most often drainage accomplished by putting a needle into the abscess or cutting (incising) and draining the abscess. This is most often followed with a course of antibiotics. HOME CARE INSTRUCTIONS  If your abscess was drained by your caregiver today, rinse your throat (gargle) with warm salt water four times per day or as needed for comfort. Do not swallow this mixture. Mix 1 teaspoon of salt in 8 ounces of warm water for gargling.  Rest in bed as needed. Resume activities as able.  Apply cold to your neck for pain relief.  Fill a plastic bag with ice and wrap it in a towel. Hold the ice on your neck for 20 minutes 4 times per day.  Eat a soft or liquid diet as tolerated while your throat remains sore. Popsicles and ice cream may be good early choices. Drinking plenty of cold fluids will probably be soothing and help take swelling down in between the warm gargles.  Only take over-the-counter or prescription medicines for pain, discomfort, or fever as directed by your caregiver. Do not use aspirin unless directed by your physician. Aspirin slows down the clotting process. It can also cause bleeding from the drainage area if this was needled or incised today.  If antibiotics were prescribed, take them as directed for the full course of the prescription. Even if you feel you are well, you need to take them. SEEK MEDICAL CARE IF:   You have increased pain, swelling, redness, or drainage in your throat.  You develop signs of infection such as dizziness, headache, lethargy, or generalized feelings of illness.  You have difficulty breathing, swallowing or eating.  You show signs of becoming dehydrated (lightheadedness when standing, decreased urine output, a fast heart rate, or dry mouth and mucous membranes). SEEK IMMEDIATE MEDICAL CARE IF:   You have a fever.  You are coughing up or vomiting blood.  You develop more severe throat pain uncontrolled with medicines or you start to drool.  You develop difficulty breathing, talking, or find it easier to breathe while leaning forward. Document Released: 06/20/2005 Document Revised: 09/12/2011 Document Reviewed: 02/01/2008 [employer] Patient Information 2015  [employer]. This information is not intended to replace advice given to you by your health care provider. Make sure you discuss any questions you have with your health care provider.  Peritonsillar Cellulitis Peritonsillar cellulitis is an infection around a tonsil. This infection usually affects just one of the  two tonsils. The result is a severe sore throat. Peritonsillar cellulitis can develop at any age. It often develops in individuals who have had frequent sore throats and who have frequently taken antibiotics. CAUSES  Peritonsillar cellulitis is usually caused by more than one type of germ (bacteria). SYMPTOMS  At first, it might seem like a regular sore throat. But a sore throat from peritonsillar cellulitis does not go away in a few days. Instead, it gets worse.  Early symptoms of peritonsillar cellulitis may include:  Fever and/or chills.  A throat that is sore on one side only.  Pain in one ear.  Pain when swallowing.  Feeling more tired than usual.  Later symptoms may include:  Severe pain when swallowing.  Drooling.  Trouble opening the mouth wide.  Bad breath.  Voice changes. DIAGNOSIS  In most cases, your caregiver can make the diagnosis by knowing your symptoms, examining your throat and getting a throat culture. Blood samples may also help to determine the cause of your sore throat. TREATMENT  This is not an ordinary sore throat. It is a condition that needs to be treated quickly. If it is not treated, swelling and pus (an abscess) can develop.  Peritonsillar cellulitis is usually treated with antibiotics. These infections require oral antibiotics for a full 10 days and/or antibiotics given into the vein (intravenous, IV).  Medications may be prescribed for pain or fever.  Sometimes, medications that fight swelling (steroids) are prescribed.  If an abscess has formed, the abscess may need to be drained.  Individuals who have repeated cases of peritonsillar cellulitis may need an operation to remove the tonsils (tonsillectomy). HOME CARE INSTRUCTIONS   Take antibiotics as directed by your caregiver. Take all the antibiotics, even if you start to feel better.  Some pain is normal with this condition. Take pain medication as directed by your caregiver. Do not take  any other pain medications unless approved by your caregiver.  Gargle with warm salt water. Use 1 teaspoon (5 grams) salt mixed in 1 cup (250 mL) of warm water. Gargle for 30 seconds or more before spitting the solution out. Gargle 3 to 4 times a day or as needed. This may help ease pain and swelling.  A liquid or soft food diet may be necessary if it is hard to swallow.  It is important to drink fluids. Drink enough water and fluids to keep your urine clear or pale yellow.  Do not smoke.  Rest and get plenty of sleep.  If your caregiver has given you a follow-up appointment, it is very important to keep that appointment. Your caregiver will need to make sure that the infection is getting better. It is important to check that an abscess is not forming.  Return to work or school as directed by your caregiver. SEEK MEDICAL CARE IF:   Your swelling increases.  You have difficulty swallowing.  You are unable to take your antibiotic. SEEK IMMEDIATE MEDICAL CARE IF:   You have trouble breathing.  Your pain gets worse even after taking pain medicine.  You see pus around or near the tonsils.  Your voice changes.  You are drooling.  You cough up bloody sputum.  You are unable to swallow.  You have a fever. MAKE SURE YOU:   Understand these instructions.  Will watch your condition.  Will get help right away if you are not doing well or get worse. Document Released: 09/14/2009 Document Revised: 11/04/2013 Document Reviewed: 09/14/2009 [facility] Patient Information 2015 [place], [place]. This information is not intended to replace advice given to you by your health care provider. Make sure you discuss any questions you have with your health care provider.

## 2014-12-07 DIAGNOSIS — J392 Other diseases of pharynx: Secondary | ICD-10-CM | POA: Insufficient documentation

## 2014-12-07 LAB — BASIC METABOLIC PANEL
Anion gap: 8 (ref 5–15)
BUN: 10 mg/dL (ref 6–20)
CALCIUM: 8.6 mg/dL — AB (ref 8.9–10.3)
CO2: 29 mmol/L (ref 22–32)
CREATININE: 0.94 mg/dL (ref 0.44–1.00)
Chloride: 101 mmol/L (ref 101–111)
GFR calc Af Amer: >60 mL/min (ref >60)
GFR calc non Af Amer: >60 mL/min (ref >60)
Glucose, Bld: 166 mg/dL — ABNORMAL HIGH (ref 65–99)
Potassium: 3.4 mmol/L — ABNORMAL LOW (ref 3.5–5.1)
Sodium: 138 mmol/L (ref 135–145)

## 2014-12-07 LAB — CBC
HCT: 35.4 % — ABNORMAL LOW (ref 36.0–46.0)
Hemoglobin: 11.6 g/dL — ABNORMAL LOW (ref 12.0–15.0)
MCH: 29.7 pg (ref 26.0–34.0)
MCHC: 32.8 g/dL (ref 30.0–36.0)
MCV: 90.5 fL (ref 78.0–100.0)
Platelets: 219 10*3/uL (ref 150–400)
RBC: 3.91 MIL/uL (ref 3.87–5.11)
RDW: 13.3 % (ref 11.5–15.5)
WBC: 17.1 10*3/uL — AB (ref 4.0–10.5)

## 2014-12-07 LAB — URINE CULTURE

## 2014-12-07 LAB — MRSA PCR SCREENING: MRSA by PCR: NEGATIVE

## 2014-12-07 MED ORDER — METOPROLOL TARTRATE 1 MG/ML IV SOLN
2.5000 mg | Freq: Four times a day (QID) | INTRAVENOUS | Status: DC
  Administered 2014-12-07 – 2014-12-08 (×5): 2.5 mg via INTRAVENOUS
  Filled 2014-12-07 (×10): qty 5

## 2014-12-07 MED ORDER — CETYLPYRIDINIUM CHLORIDE 0.05 % MT LIQD
7.0000 mL | Freq: Two times a day (BID) | OROMUCOSAL | Status: DC
  Administered 2014-12-07 – 2014-12-14 (×10): 7 mL via OROMUCOSAL

## 2014-12-07 MED ORDER — SODIUM CHLORIDE 0.9 % IJ SOLN
INTRAMUSCULAR | Status: AC
  Filled 2014-12-07: qty 10

## 2014-12-07 MED ORDER — LIDOCAINE HCL (CARDIAC) 20 MG/ML IV SOLN
INTRAVENOUS | Status: DC | PRN
  Administered 2014-12-07: 100 mg via INTRAVENOUS

## 2014-12-07 MED ORDER — CLINDAMYCIN PHOSPHATE 600 MG/50ML IV SOLN
600.0000 mg | Freq: Four times a day (QID) | INTRAVENOUS | Status: DC
  Administered 2014-12-07 – 2014-12-08 (×6): 600 mg via INTRAVENOUS
  Filled 2014-12-07 (×11): qty 50

## 2014-12-07 MED ORDER — POLYETHYLENE GLYCOL 3350 17 G PO PACK
17.0000 g | PACK | Freq: Every day | ORAL | Status: DC | PRN
  Administered 2014-12-07 – 2014-12-14 (×3): 17 g via ORAL
  Filled 2014-12-07 (×5): qty 1

## 2014-12-07 MED ORDER — PROPOFOL 10 MG/ML IV BOLUS
INTRAVENOUS | Status: AC
  Filled 2014-12-07: qty 20

## 2014-12-07 MED ORDER — FUROSEMIDE 10 MG/ML IJ SOLN
INTRAMUSCULAR | Status: AC
  Filled 2014-12-07: qty 2

## 2014-12-07 MED ORDER — SUCCINYLCHOLINE CHLORIDE 20 MG/ML IJ SOLN
INTRAMUSCULAR | Status: AC
  Filled 2014-12-07: qty 1

## 2014-12-07 MED ORDER — POTASSIUM CHLORIDE 2 MEQ/ML IV SOLN
INTRAVENOUS | Status: DC
Start: 2014-12-07 — End: 2014-12-07
  Administered 2014-12-07: 13:00:00 via INTRAVENOUS
  Filled 2014-12-07 (×3): qty 1000

## 2014-12-07 MED ORDER — WHITE PETROLATUM GEL
Status: AC
  Administered 2014-12-07: 0.4
  Filled 2014-12-07: qty 1

## 2014-12-07 MED ORDER — WHITE PETROLATUM GEL
Status: AC
  Filled 2014-12-07: qty 1

## 2014-12-07 MED ORDER — FUROSEMIDE 10 MG/ML IJ SOLN
20.0000 mg | Freq: Once | INTRAMUSCULAR | Status: AC
  Administered 2014-12-07: 20 mg via INTRAVENOUS
  Filled 2014-12-07: qty 2

## 2014-12-07 MED ORDER — DEXAMETHASONE SODIUM PHOSPHATE 4 MG/ML IJ SOLN
INTRAMUSCULAR | Status: AC
  Filled 2014-12-07: qty 1

## 2014-12-07 MED ORDER — ALBUTEROL SULFATE (2.5 MG/3ML) 0.083% IN NEBU
3.0000 mL | INHALATION_SOLUTION | Freq: Four times a day (QID) | RESPIRATORY_TRACT | Status: DC | PRN
  Administered 2014-12-07: 3 mL via RESPIRATORY_TRACT
  Filled 2014-12-07: qty 3

## 2014-12-07 MED ORDER — LIDOCAINE HCL (CARDIAC) 20 MG/ML IV SOLN
INTRAVENOUS | Status: AC
  Filled 2014-12-07: qty 5

## 2014-12-07 MED ORDER — SUCCINYLCHOLINE CHLORIDE 20 MG/ML IJ SOLN
INTRAMUSCULAR | Status: DC | PRN
  Administered 2014-12-07: 120 mg via INTRAVENOUS

## 2014-12-07 MED ORDER — MIDAZOLAM HCL 5 MG/5ML IJ SOLN
INTRAMUSCULAR | Status: DC | PRN
  Administered 2014-12-06: 2 mg via INTRAVENOUS

## 2014-12-07 MED ORDER — LORAZEPAM 0.5 MG PO TABS
0.5000 mg | ORAL_TABLET | Freq: Once | ORAL | Status: AC
  Administered 2014-12-07: 0.5 mg via ORAL
  Filled 2014-12-07: qty 1

## 2014-12-07 MED ORDER — PANTOPRAZOLE SODIUM 40 MG IV SOLR
40.0000 mg | INTRAVENOUS | Status: DC
  Administered 2014-12-07 – 2014-12-10 (×4): 40 mg via INTRAVENOUS
  Filled 2014-12-07 (×6): qty 40

## 2014-12-07 MED ORDER — DEXAMETHASONE SODIUM PHOSPHATE 4 MG/ML IJ SOLN
INTRAMUSCULAR | Status: DC | PRN
  Administered 2014-12-07: 4 mg via INTRAVENOUS

## 2014-12-07 MED ORDER — AMITRIPTYLINE HCL 50 MG PO TABS
50.0000 mg | ORAL_TABLET | Freq: Every day | ORAL | Status: DC
Start: 2014-12-07 — End: 2014-12-14
  Administered 2014-12-07 – 2014-12-13 (×7): 50 mg via ORAL
  Filled 2014-12-07 (×8): qty 1

## 2014-12-07 MED ORDER — ACETAMINOPHEN 160 MG/5ML PO SOLN
650.0000 mg | ORAL | Status: DC | PRN
  Administered 2014-12-08: 650 mg via ORAL
  Filled 2014-12-07: qty 20.3

## 2014-12-07 MED ORDER — SUFENTANIL CITRATE 50 MCG/ML IV SOLN
INTRAVENOUS | Status: DC | PRN
  Administered 2014-12-06 – 2014-12-07 (×2): 10 ug via INTRAVENOUS

## 2014-12-07 MED ORDER — 0.9 % SODIUM CHLORIDE (POUR BTL) OPTIME
TOPICAL | Status: DC | PRN
  Administered 2014-12-07: 70 mL

## 2014-12-07 MED ORDER — ONDANSETRON HCL 4 MG/2ML IJ SOLN: 4.0000 mg | Freq: Four times a day (QID) | INTRAMUSCULAR | Status: DC | PRN

## 2014-12-07 MED ORDER — SODIUM CHLORIDE 0.9 % IV SOLN
INTRAVENOUS | Status: DC
  Administered 2014-12-07: 04:00:00 via INTRAVENOUS
  Filled 2014-12-07 (×3): qty 1000

## 2014-12-07 MED ORDER — MAGIC MOUTHWASH W/LIDOCAINE
15.0000 mL | Freq: Four times a day (QID) | ORAL | Status: DC | PRN
  Administered 2014-12-07 – 2014-12-14 (×6): 15 mL via ORAL
  Filled 2014-12-07 (×11): qty 15

## 2014-12-07 MED ORDER — PROPOFOL 10 MG/ML IV BOLUS
INTRAVENOUS | Status: DC | PRN
  Administered 2014-12-07: 200 mg via INTRAVENOUS
  Administered 2014-12-07: 50 mg via INTRAVENOUS

## 2014-12-07 MED ORDER — CLINDAMYCIN PHOSPHATE 900 MG/50ML IV SOLN
900.0000 mg | INTRAVENOUS | Status: AC
  Administered 2014-12-07: 900 mg via INTRAVENOUS
  Filled 2014-12-07: qty 50

## 2014-12-07 MED ORDER — HYDROMORPHONE HCL 1 MG/ML IJ SOLN
0.5000 mg | INTRAMUSCULAR | Status: DC | PRN
  Administered 2014-12-07 – 2014-12-08 (×5): 0.5 mg via INTRAVENOUS
  Filled 2014-12-07 (×5): qty 1

## 2014-12-07 MED ORDER — AMINOCAPROIC ACID SOLUTION 5% (50 MG/ML)
10.0000 mL | ORAL | Status: DC
  Administered 2014-12-07 – 2014-12-09 (×9): 10 mL via ORAL
  Filled 2014-12-07 (×3): qty 100

## 2014-12-07 MED ORDER — GLYCOPYRROLATE 0.2 MG/ML IJ SOLN
INTRAMUSCULAR | Status: DC | PRN
  Administered 2014-12-06: 0.2 mg via INTRAVENOUS

## 2014-12-07 MED ORDER — ALBUTEROL SULFATE (2.5 MG/3ML) 0.083% IN NEBU
INHALATION_SOLUTION | RESPIRATORY_TRACT | Status: AC
  Administered 2014-12-07: 2.5 mg
  Filled 2014-12-07: qty 3

## 2014-12-07 MED ORDER — GLYCOPYRROLATE 0.2 MG/ML IJ SOLN
INTRAMUSCULAR | Status: AC
  Filled 2014-12-07: qty 1

## 2014-12-07 MED ORDER — ONDANSETRON HCL 4 MG/2ML IJ SOLN
INTRAMUSCULAR | Status: AC
  Filled 2014-12-07: qty 2

## 2014-12-07 MED ORDER — ONDANSETRON HCL 4 MG/2ML IJ SOLN
INTRAMUSCULAR | Status: DC | PRN
Start: 2014-12-07 — End: 2014-12-07
  Administered 2014-12-07: 4 mg via INTRAVENOUS

## 2014-12-07 MED ORDER — DEXAMETHASONE SODIUM PHOSPHATE 10 MG/ML IJ SOLN
10.0000 mg | Freq: Three times a day (TID) | INTRAMUSCULAR | Status: AC
  Administered 2014-12-07 (×3): 10 mg via INTRAVENOUS
  Filled 2014-12-07 (×4): qty 1

## 2014-12-07 MED ORDER — SODIUM CHLORIDE 0.9 % IJ SOLN
3.0000 mL | Freq: Two times a day (BID) | INTRAMUSCULAR | Status: DC
  Administered 2014-12-07 – 2014-12-14 (×14): 3 mL via INTRAVENOUS

## 2014-12-07 MED ORDER — PROMETHAZINE HCL 25 MG/ML IJ SOLN
12.5000 mg | Freq: Four times a day (QID) | INTRAMUSCULAR | Status: DC | PRN
Start: 2014-12-07 — End: 2014-12-14

## 2014-12-07 NOTE — Progress Notes (Signed)
Subjective: POD#1 from tonsillectomy for acute tonsillitis with right peritonsillar abscess. On facemask oxygen, stable  Objective: Vital signs in last 24 hours: Temp:  [97.7 F (36.5 C)-100.3 F (37.9 C)] 98.5 F (36.9 C) (06/05 0300) Pulse Rate:  [102-126] 105 (06/05 0700) Resp:  [11-33] 21 (06/05 0700) BP: (104-156)/(66-94) 129/74 mmHg (06/05 0700) SpO2:  [81 %-100 %] 92 % (06/05 0700) FiO2 (%):  [40 %-60 %] 50 % (06/05 0640) Weight:  [109.3 kg (240 lb 15.4 oz)] 109.3 kg (240 lb 15.4 oz) (06/05 0300)  Trachea midline, NAD, A&O, oral cavity with tonsils surgically absent and hemostatic, midline uvula.  @LABLAST2 (wbc:2,hgb:2,hct:2,plt:2)  Recent Labs  12/06/14 1946 12/07/14 0549  NA 139 138  K 2.9* 3.4*  CL 100* 101  CO2  --  29  GLUCOSE 132* 166*  BUN 14 10  CREATININE 1.00 0.94  CALCIUM  --  8.6*    Medications:  No current facility-administered medications on file prior to encounter.   Current Outpatient Prescriptions on File Prior to Encounter  Medication Sig Dispense Refill  . acetaminophen (TYLENOL) 650 MG CR tablet Take 650 mg by mouth every 8 (eight) hours as needed for pain.    Marland Kitchen amitriptyline (ELAVIL) 50 MG tablet Take 1 tablet (50 mg total) by mouth at bedtime. 30 tablet 1  . aspirin 81 MG tablet Take 81 mg by mouth daily.    . cyclobenzaprine (FLEXERIL) 10 MG tablet Take 1 tablet (10 mg total) by mouth 3 (three) times daily as needed for muscle spasms. 90 tablet 2  . EQ LORATADINE 10 MG tablet TAKE ONE TABLET BY MOUTH ONCE DAILY 30 tablet 3  . metoprolol tartrate (LOPRESSOR) 25 MG tablet Take 1 tablet (25 mg total) by mouth 2 (two) times daily. 60 tablet 6  . naproxen (NAPROSYN) 500 MG tablet Take 1 tablet (500 mg total) by mouth 2 (two) times daily as needed for mild pain, moderate pain or headache (TAKE WITH MEALS.). 20 tablet 0  . omeprazole (PRILOSEC) 20 MG capsule TAKE ONE CAPSULE BY MOUTH ONCE DAILY 30 capsule 3  . PROVENTIL HFA 108 (90 BASE) MCG/ACT  inhaler INHALE TWO PUFFS BY MOUTH EVERY 6 HOURS AS NEEDED FOR  WHEEZING  OR  SHORTNESS  OF  BREATH 2 each 1  . carvedilol (COREG) 12.5 MG tablet Take 1 tablet (12.5 mg total) by mouth 2 (two) times daily with a meal. 60 tablet 0  . hydrochlorothiazide (HYDRODIURIL) 25 MG tablet Take 1 tablet (25 mg total) by mouth daily. 90 tablet 3    Assessment/Plan: Doing well POD#1 from tonsillectomy. Can go home from ENT standpoint once oxygen saturations/vitals stable and follow up with ENT in 3-4 weeks. Clear liquids while inpatient then post-tonsillectomy diet as tolerated at home.   LOS: 1 day   Ruby Cola 12/07/2014, 8:06 AM

## 2014-12-07 NOTE — Anesthesia Postprocedure Evaluation (Signed)
  Anesthesia Post-op Note  Patient: Kristin [name]  Procedure(s) Performed: Procedure(s): TONSILLECTOMY, BILATERAL (Bilateral)  Patient Location: PACU  Anesthesia Type:General  Level of Consciousness: awake, sedated and responds to stimulation  Airway and Oxygen Therapy: Pt connected to BiPAP  Post-op Pain: none  Post-op Assessment: Post-op Vital signs reviewed. Pt easily arousable to stimulation and following commands, but still sleepy and requiring BiPAP. Likely with OSA at baseline. Will send to ICU on BiPAP.  Post-op Vital Signs: Reviewed  Last Vitals:  Filed Vitals:   12/07/14 0157  BP: 105/66  Pulse: 117  Temp:   Resp: 19    Complications: No apparent anesthesia complications

## 2014-12-07 NOTE — Progress Notes (Signed)
Family Medicine Teaching Service Daily Progress Note Intern Pager: [phone]  Patient name: Kristin [name] Medical record number: [id number] Date of birth: 1955-08-30 Age: 59 y.o. Gender: female  Primary Care Provider: Tommi Rumps, MD Consultants: ENT Code Status: Full code  Pt Overview and Major Events to Date:  6/4: I&D and tonsilectomy 6/5: Bipap after surgery  Assessment and Plan: Kristin [name] is a 59 y.o. female presenting with difficulty swallowing . 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 HR 117-126 in ED. No O2 requirement. S/p Clindamycin x1. CT neck with evidence of R palatine abscess 2.2x1.4cm and surrounding soft tissue edema. I&D attempted in ED. - post surgery patient required Bipap. Bipap is being weaned this am and she is currently on venti mask and doing well. She still has nasal trumpet in, in the event of swelling.  - ENT recommends: continuing clindamycin, clears until discharge,  Prednisolone with taper (Dr. Simeon Craft states he called in clinda and prednisolone taper). He would also like her to have los dose pain medication and ODT zofran on DC. Pt does have prolonged Qtc, if not resolved prior to dc could consider phenergan in place of zofran.  -Dilaudid 0.5mg  q3 PRN severe pain. Will transition to PO pain medication when able -Promethazine PRN Nausea - WBC today improved 19.1>17.1, Hgb stable  Tachycardia: has a h/o this. Patient was scheduled to have echo and myoview done this upcoming week with Dr Caryl Comes. EKG with sinus tach and prolonged QTc 539. Metoprolol 25 mg BID at home -Patient may need to reschedule cardiac evaluation -Metoprolol 2.5mg  q6, transition to oral once able - ekg ordered  Hypokalemia: likely 2/2 decreased PO. K 2.9 in ED. On HCTZ at home. 62mEq of K IV given in ED.  -hold HCTZ for  now. -K 38mEq added to IVFs, while NPO -BMET improved to 3.4 today, will completed bag of NaCl with 94mEq, and the transition to Nacl only  HTN: normotensive in ED.  -Resume Metoprolol (IV) until able to PO -Hold HCTZ until able to PO  GERD: Prilosec at home -Protonix while hospitalized  Depression:  -Resume home Amitriptyline once able to PO  Renal sclerosis: Cr at baseline on admission (0.91-1.06). Only has one kidney. CT neck was performed with contrast. - Cr stable today  FEN/GI: NPO will advance to clears in am if tolerates, PPI Prophylaxis: SCDs for now.  Disposition: Admit to SDU for close monitoring  Disposition: Home pending clinical improvement with ENT f/u 3-4 weeks  Subjective:  Patient tired. Answers appropriately. Rates current pain 4/10. Has a mild cough. Currently being weaned to venti mask and tolerating well. Has small blood tinged drainage phlegm with cough. No frank blood. Having difficulty using bed pan.  Objective: Temp:  [97.7 F (36.5 C)-100.3 F (37.9 C)] 98.5 F (36.9 C) (06/05 0300) Pulse Rate:  [102-126] 104 (06/05 0640) Resp:  [11-33] 25 (06/05 0640) BP: (104-156)/(66-94) 123/78 mmHg (06/05 0600) SpO2:  [81 %-100 %] 95 % (06/05 0640) FiO2 (%):  [40 %-60 %] 50 % (06/05 0640) Weight:  [240 lb 15.4 oz (109.3 kg)] 240 lb 15.4 oz (109.3 kg) (06/05 0300) Physical Exam: General: Sleepy. Awakes to  Name and answers appropriately.  Eyes: EOMI  Neck: full feeling throughout with mild TTP, +submandibular LAD Cardiovascular: tachycardic but rhythm regular, no murmurs, no thrills Respiratory: CTAB anteriorly, no increased WOB Abdomen: obese, NT/ND, +  BS Ext:no erythema or edema, negative homan's  MSK: moves extremities independently Skin: no rashes appreciated Neuro: follows commands, no focal deficits    Laboratory:  Recent Labs Lab 12/06/14 1515 12/06/14 1946 12/07/14 0549  WBC 19.1*  --  17.1*  HGB 12.3 12.6 11.6*  HCT 36.5 37.0 35.4*   PLT 260  --  219    Recent Labs Lab 12/06/14 1946 12/07/14 0549  NA 139 138  K 2.9* 3.4*  CL 100* 101  CO2  --  29  BUN 14 10  CREATININE 1.00 0.94  CALCIUM  --  8.6*  GLUCOSE 132* 166*     Imaging/Diagnostic Tests: Dg Neck Soft Tissue 6/4 Abnormal prevertebral soft tissue prominence at C2-3, new since the prior exams. The possibility of prevertebral abscess should be considered. Electronically Signed By: Lorriane Shire M.D. On: 12/06/2014 15:19   6/4 Dg Chest 2 View  IMPRESSION: Subsegmental atelectasis RIGHT middle lobe.  Ct Soft Tissue Neck W Contrast 12/06/2014 1. Evolving 2.2 x 1.4 cm abscess at the deep portion of the right palatine tonsil, with associated diffuse soft tissue edema and right-sided effacement of the upper hypopharynx. Soft tissue edema extends inferiorly to the level of the valleculae and piriform sinuses, with effacement of the left piriform sinus. Soft tissue edema also extends superiorly along the right side of the oropharynx. 2. Prevertebral soft tissue edema extends to the inferior hypopharynx, with minimal prevertebral soft tissue thickening. Diffuse asymmetric soft tissue inflammation at the right parapharyngeal fat planes. 3. Mild asymmetric prominence of the right submandibular gland, with surrounding soft tissue inflammation tracking about the right side of the neck. Minimal soft tissue stranding extends to the inferior floor of the mouth, without significant Ludwig's angina at this time. 4. Mildly enlarged bilateral cervical nodes seen, measuring up to 1.3 cm in short axis. These are somewhat more prominent on the right. 5. Mild degenerative change noted along the cervical spine.   Ma Hillock, DO 12/07/2014, 7:21 AM PGY-3, [place] Intern pager: [phone], text pages welcome

## 2014-12-07 NOTE — Progress Notes (Signed)
FPTS Interim Progress Note  S:  Visited patient this evening.  She was resting in bed, NAD.  Breathing and pain has improved from earlier.  She denies any concerns at this time.    O: BP 142/92 mmHg  Pulse 118  Temp(Src) 98.9 F (37.2 C) (Axillary)  Resp 23  Ht 5\' 2"  (1.575 m)  Wt 240 lb 15.4 oz (109.3 kg)  BMI 44.06 kg/m2  SpO2 97%  Gen: resting in bed, NAD Pulm: non rebreather in place, saturating 98%, WOB normal, mild bibasilar crackles anteriorly Ext: SCDs in place, no edema appreciated  A/P: Patient breathing better than earlier.  Will continue NRB and wean as tolerated.  Discussed plan going forward with patient.  Once patient is breathing on RA and tolerating clears well will plan for discharge and follow up with ENT as scheduled.  Janora Norlander, DO 12/07/2014, 9:37 PM PGY-1, [place] Medicine Service pager [phone]

## 2014-12-07 NOTE — Op Note (Signed)
DATE OF OPERATION: 12/07/2014 Surgeon: Ruby Cola Procedure Performed: [phone] bilateral tonsillectomy  PREOPERATIVE DIAGNOSIS: tonsillar hypertrophy, acute tonsillitis, right peritonsillar abscess  POSTOPERATIVE DIAGNOSIS: tonsillar hypertrophy, acute tonsillitis, right peritonsillar abscess SURGEON: Ruby Cola ANESTHESIA: General endotracheal.  ESTIMATED BLOOD LOSS: less than 25 mL.  DRAINS: none SPECIMENS:right and left tonsil INDICATIONS: The patient is a 59yo with tonsillar hypertrophy, acute tonsillitis, right peritonsillar abscess DESCRIPTION OF OPERATION: The patient was brought to the operating room and was placed in the supine position and was placed under general endotracheal anesthesia by anesthesiology. The bed was turned 90 degrees and the Crowe-Davis mouth retractor was placed over the endotracheal tube and suspended from the Mayo stand. The palate was inspected and palpated and noted to be intact with no submucous cleft. The uvula was normal and essentially midline.  Next the right tonsil was grasped with a curved Allis clamp and dissected from the right tonsillar fossa using the Bovie. Meticulous hemostasis was then achieved. There was some right lateral peritonsillar phlegmon/purulent fluid that was drained and suctioned. The left tonsil was then grasped with the curved Allis and dissected from the left tonsillar fossa using the Bovie. Meticulous hemostasis was achieved. The nasal cavity and oropharynx were irrigated out and then the the nose, oral cavity,  and stomach were suctioned out. The patient was turned back to anesthesia and awakened from anesthesia and extubated without difficulty. The patient tolerated the procedure well with no immediate complications and was taken to the postoperative recovery area in good condition.   Dr. Ruby Cola was present and performed the entire procedure. 12/07/2014  1:33 AM Ruby Cola

## 2014-12-07 NOTE — Progress Notes (Signed)
Subjective: S/p tonsillectomy  Objective: Vital signs in last 24 hours: Temp:  [97.7 F (36.5 C)-100.3 F (37.9 C)] 97.7 F (36.5 C) (06/05 0132) Pulse Rate:  [117-126] 119 (06/05 0136) Resp:  [15-33] 25 (06/05 0136) BP: (116-156)/(67-94) 132/85 mmHg (06/05 0132) SpO2:  [81 %-99 %] 89 % (06/05 0136)  Midline uvula, tonsils surgically absent, oropharynx hemostatic. Sleepy but awakens easily, on BiPAP  @LABLAST2 (wbc:2,hgb:2,hct:2,plt:2)  Recent Labs  12/06/14 1946  NA 139  K 2.9*  CL 100*  GLUCOSE 132*  BUN 14  CREATININE 1.00    Medications:  Scheduled Meds: . aminocaproic acid  10 mL Oral Q4H while awake  . clindamycin (CLEOCIN) IV  600 mg Intravenous 4 times per day  . dexamethasone  10 mg Intravenous 3 times per day   Continuous Infusions:  PRN Meds:.acetaminophen (TYLENOL) oral liquid 160 mg/5 mL, HYDROmorphone (DILAUDID) injection, [MAR Hold]  HYDROmorphone (DILAUDID) injection, ondansetron (ZOFRAN) IV, promethazine  Assessment/Plan: S/p tonsillectomy for acute tonsillitis/right peritonsillar abscess. Would do clear liquids only until discharged home and then can advance as tolerated to post-tonsillectomy diet. I called in clindamycin liquid and prednisolone taper to [facility]	[place], she will need a low dose of hydrocodone/acetaminophen and ODT zofran when discharged and can follow up with ENT in 3-4 weeks after discharge.   Family Medicine teaching service is admitting for observation, respiratory and medical issues.   LOS: 1 day   Ruby Cola 12/07/2014, 1:42 AM

## 2014-12-07 NOTE — Anesthesia Procedure Notes (Signed)
Procedure Name: Intubation Date/Time: 12/07/2014 12:01 AM Performed by: Claris Che Pre-anesthesia Checklist: Patient identified, Emergency Drugs available, Suction available, Patient being monitored and Timeout performed Patient Re-evaluated:Patient Re-evaluated prior to inductionOxygen Delivery Method: Circle system utilized Preoxygenation: Pre-oxygenation with 100% oxygen Intubation Type: IV induction, Rapid sequence and Cricoid Pressure applied Ventilation: Mask ventilation without difficulty Laryngoscope Size: Mac and 3 Grade View: Grade II Tube type: Oral Tube size: 7.5 mm Number of attempts: 1 Airway Equipment and Method: Stylet Placement Confirmation: ETT inserted through vocal cords under direct vision,  positive ETCO2 and breath sounds checked- equal and bilateral Tube secured with: Tape Dental Injury: Teeth and Oropharynx as per pre-operative assessment

## 2014-12-07 NOTE — Transfer of Care (Signed)
Immediate Anesthesia Transfer of Care Note  Patient: Kristin [name]  Procedure(s) Performed: Procedure(s): TONSILLECTOMY, BILATERAL (Bilateral)  Patient Location: PACU  Anesthesia Type:General  Level of Consciousness: sedated, patient cooperative and responds to stimulation  Airway & Oxygen Therapy: Patient Spontanous Breathing, Patient connected to face mask oxygen and Placed on bipap by respiratory  Post-op Assessment: Report given to RN, Post -op Vital signs reviewed and stable and Patient moving all extremities X 4  Post vital signs: Reviewed and stable  Last Vitals:  Filed Vitals:   12/07/14 0136  BP:   Pulse: 119  Temp:   Resp: 25    Complications: respiratory complications

## 2014-12-07 NOTE — Progress Notes (Signed)
FPTS Interim Progress Note  S: Called by RN to report patient was placed back on BiPap.  Reports rhonchorus breath sounds and increased WOB with desats requiring escalation back to BiPap.  Patient reports dyspnea starting 1 hour ago.  Never been told that she has HF, no CP.  Reports single kidney (of note, old CT seems to show some renal sclerosis but 2 kidneys present, and Cr at baseline here).  O: BP 140/91 mmHg  Pulse 121  Temp(Src) 98.2 F (36.8 C) (Axillary)  Resp 27  Ht 5\' 2"  (1.575 m)  Wt 240 lb 15.4 oz (109.3 kg)  BMI 44.06 kg/m2  SpO2 96%  Gen: BiPap in place, appears anxious, writes answers to questions on notepad CV: Tachycardic to 120s, reg rhythm, no murmurs aprpeciated Pulm: Diffuse coarse crackles, no accessory muscle use. BiPap in place, tachypnic. Ext: 1+ pitting edema in b/l ankles  A/P: Respiratory status likely worsened from fluid overload. - d/c IVF - IV Lasix 20mg  x1 - Continue BiPap - PO Ativan 0.5mg  x1 for anxiety - Will reassess after Lasix dose given - Will c/s CCM if resp status is worsening  Virginia Crews, MD 12/07/2014, 2:54 PM PGY-1, [place] Medicine Service pager [phone]

## 2014-12-08 ENCOUNTER — Inpatient Hospital Stay (HOSPITAL_COMMUNITY): Payer: Self-pay

## 2014-12-08 ENCOUNTER — Encounter (HOSPITAL_COMMUNITY): Payer: Self-pay | Admitting: Otolaryngology

## 2014-12-08 LAB — PROCALCITONIN: Procalcitonin: 0.35 ng/mL

## 2014-12-08 LAB — CBC
HEMATOCRIT: 33.8 % — AB (ref 36.0–46.0)
HEMOGLOBIN: 10.9 g/dL — AB (ref 12.0–15.0)
MCH: 29.1 pg (ref 26.0–34.0)
MCHC: 32.2 g/dL (ref 30.0–36.0)
MCV: 90.4 fL (ref 78.0–100.0)
PLATELETS: 263 10*3/uL (ref 150–400)
RBC: 3.74 MIL/uL — ABNORMAL LOW (ref 3.87–5.11)
RDW: 13.3 % (ref 11.5–15.5)
WBC: 14 10*3/uL — AB (ref 4.0–10.5)

## 2014-12-08 LAB — BRAIN NATRIURETIC PEPTIDE: B Natriuretic Peptide: 119.7 pg/mL — ABNORMAL HIGH (ref 0.0–100.0)

## 2014-12-08 LAB — BLOOD GAS, ARTERIAL
Acid-Base Excess: 4.6 mmol/L — ABNORMAL HIGH (ref 0.0–2.0)
Bicarbonate: 29.3 mEq/L — ABNORMAL HIGH (ref 20.0–24.0)
Drawn by: 275531
FIO2: 1 %
O2 Saturation: 91.7 %
PATIENT TEMPERATURE: 98.6
PCO2 ART: 49.5 mmHg — AB (ref 35.0–45.0)
TCO2: 30.9 mmol/L (ref 0–100)
pH, Arterial: 7.39 (ref 7.350–7.450)
pO2, Arterial: 60.6 mmHg — ABNORMAL LOW (ref 80.0–100.0)

## 2014-12-08 LAB — LACTIC ACID, PLASMA
LACTIC ACID, VENOUS: 1.6 mmol/L (ref 0.5–2.0)
Lactic Acid, Venous: 1.3 mmol/L (ref 0.5–2.0)

## 2014-12-08 LAB — TROPONIN I: TROPONIN I: 0.04 ng/mL — AB (ref <0.031)

## 2014-12-08 LAB — BASIC METABOLIC PANEL
ANION GAP: 9 (ref 5–15)
BUN: 15 mg/dL (ref 6–20)
CHLORIDE: 100 mmol/L — AB (ref 101–111)
CO2: 30 mmol/L (ref 22–32)
CREATININE: 0.88 mg/dL (ref 0.44–1.00)
Calcium: 8.8 mg/dL — ABNORMAL LOW (ref 8.9–10.3)
GFR calc Af Amer: >60 mL/min (ref >60)
GFR calc non Af Amer: >60 mL/min (ref >60)
Glucose, Bld: 188 mg/dL — ABNORMAL HIGH (ref 65–99)
POTASSIUM: 3.7 mmol/L (ref 3.5–5.1)
Sodium: 139 mmol/L (ref 135–145)

## 2014-12-08 LAB — HEMOGLOBIN A1C
HEMOGLOBIN A1C: 6.4 % — AB (ref 4.8–5.6)
MEAN PLASMA GLUCOSE: 137 mg/dL

## 2014-12-08 LAB — CULTURE, GROUP A STREP: Strep A Culture: POSITIVE — AB

## 2014-12-08 LAB — POCT RAPID STREP A: STREPTOCOCCUS, GROUP A SCREEN (DIRECT): NEGATIVE

## 2014-12-08 MED ORDER — HYDROCODONE-ACETAMINOPHEN 7.5-325 MG/15ML PO SOLN
10.0000 mL | ORAL | Status: DC | PRN
  Administered 2014-12-08 – 2014-12-14 (×25): 10 mL via ORAL
  Filled 2014-12-08 (×26): qty 15

## 2014-12-08 MED ORDER — IOHEXOL 350 MG/ML SOLN
100.0000 mL | Freq: Once | INTRAVENOUS | Status: AC | PRN
  Administered 2014-12-08: 100 mL via INTRAVENOUS

## 2014-12-08 MED ORDER — VANCOMYCIN HCL IN DEXTROSE 750-5 MG/150ML-% IV SOLN
750.0000 mg | Freq: Two times a day (BID) | INTRAVENOUS | Status: DC
  Administered 2014-12-09 – 2014-12-10 (×3): 750 mg via INTRAVENOUS
  Filled 2014-12-08 (×5): qty 150

## 2014-12-08 MED ORDER — PIPERACILLIN-TAZOBACTAM 3.375 G IVPB
3.3750 g | Freq: Three times a day (TID) | INTRAVENOUS | Status: AC
  Administered 2014-12-08 – 2014-12-12 (×11): 3.375 g via INTRAVENOUS
  Filled 2014-12-08 (×12): qty 50

## 2014-12-08 MED ORDER — CARVEDILOL 12.5 MG PO TABS
12.5000 mg | ORAL_TABLET | Freq: Two times a day (BID) | ORAL | Status: DC
  Administered 2014-12-08 – 2014-12-09 (×2): 12.5 mg via ORAL
  Filled 2014-12-08 (×4): qty 1

## 2014-12-08 MED ORDER — VANCOMYCIN HCL 10 G IV SOLR
2000.0000 mg | Freq: Once | INTRAVENOUS | Status: AC
  Administered 2014-12-08: 2000 mg via INTRAVENOUS
  Filled 2014-12-08: qty 2000

## 2014-12-08 NOTE — Progress Notes (Signed)
Utilization Review Completed.  

## 2014-12-08 NOTE — Progress Notes (Signed)
ANTIBIOTIC CONSULT NOTE - INITIAL  Pharmacy Consult for vancomycin and Zosyn Indication: pneumonia  Allergies  Allergen Reactions  . Fluconazole Swelling    REACTION: Face swelling  . Robitussin (Alcohol Free) [Guaifenesin] Palpitations    Chest pain    Patient Measurements: Height: 5\' 2"  (157.5 cm) Weight: 245 lb 2.4 oz (111.2 kg) IBW/kg (Calculated) : 50.1   Vital Signs: Temp: 98 F (36.7 C) (06/06 1613) Temp Source: Axillary (06/06 1613) BP: 150/85 mmHg (06/06 1613) Pulse Rate: 116 (06/06 1613) Intake/Output from previous day: 06/05 0701 - 06/06 0700 In: 787.1 [P.O.:360; I.V.:277.1; IV Piggyback:150] Out: 1350 [Urine:1350] Intake/Output from this shift:    Labs:  Recent Labs  12/06/14 1515 12/06/14 1946 12/07/14 0549 12/08/14 0700  WBC 19.1*  --  17.1* 14.0*  HGB 12.3 12.6 11.6* 10.9*  PLT 260  --  219 263  CREATININE  --  1.00 0.94 0.88   Estimated Creatinine Clearance: 83 mL/min (by C-G formula based on Cr of 0.88). No results for input(s): VANCOTROUGH, VANCOPEAK, VANCORANDOM, GENTTROUGH, GENTPEAK, GENTRANDOM, TOBRATROUGH, TOBRAPEAK, TOBRARND, AMIKACINPEAK, AMIKACINTROU, AMIKACIN in the last 72 hours.   Microbiology: Recent Results (from the past 720 hour(s))  Urine culture     Status: None   Collection Time: 12/06/14 12:57 PM  Result Value Ref Range Status   Specimen Description URINE, CLEAN CATCH  Final   Special Requests NONE  Final   Colony Count   Final    20,OOO COLONIES/ML Performed at [facility]    Culture   Final    Multiple bacterial morphotypes present, none predominant. Suggest appropriate recollection if clinically indicated. Performed at [facility]    Report Status 12/07/2014 FINAL  Final  Culture, Group A Strep     Status: Abnormal   Collection Time: 12/06/14  1:10 PM  Result Value Ref Range Status   Strep A Culture Positive (A)  Final    Comment: (NOTE) Penicillin and ampicillin are drugs of choice for  treatment of beta-hemolytic streptococcal infections. Susceptibility testing of penicillins and other beta-lactam agents approved by the FDA for treatment of beta-hemolytic streptococcal infections need not be performed routinely because nonsusceptible isolates are extremely rare in any beta-hemolytic streptococcus and have not been reported for Streptococcus pyogenes (group A). (CLSI 2011) Performed At: [facility] [place], [place] [id number] Lindon Romp MD AT:[id number]   MRSA PCR Screening     Status: None   Collection Time: 12/07/14  3:05 AM  Result Value Ref Range Status   MRSA by PCR NEGATIVE NEGATIVE Final    Comment:        The GeneXpert MRSA Assay (FDA approved for NASAL specimens only), is one component of a comprehensive MRSA colonization surveillance program. It is not intended to diagnose MRSA infection nor to guide or monitor treatment for MRSA infections.     Medical History: Past Medical History  Diagnosis Date  . Back pain, chronic   . GERD (gastroesophageal reflux disease)   . Obesity   . Tobacco abuse   . Hypertension   . Hyperlipidemia   . Seizures   . Depression   . Somatization disorder   . Renal sclerosis, unspecified     only has 1 kidney    Assessment: 76 YOF admitted 12/06/2014 with a sore throat and was found to have a peritonsillar vs intratonsillar abscess, she is now s/p bilateral tonsillectomy. She has had SOB since surgery and has diffuse crackles on exam per MD note today.  WBC has decreased to 14 this morning, patient is currently afebrile. CT angio neg for PE, but does show opacities compatible with PNA. SCr 0.88 with eCrCl ~80-81mL/min using adjusted body weight as well as normalized CrCl calculation.  Goal of Therapy:  Vancomycin trough level 15-20 mcg/ml  Plan:  -Zosyn 3.375g IV q8h EI -Vancomycin 2000mg  IV x1 as loading dose, then 750mg  IV q12h using obesity nomogram since patient is >100kg -follow  c/s, repeat imaging, renal function, trough at [place] D. Sebasthian Stailey, PharmD, BCPS Clinical Pharmacist Pager: [phone] 12/08/2014 7:22 PM

## 2014-12-08 NOTE — Progress Notes (Signed)
FPTS Interim Progress Note  S: Checked on patient following her chest CTA to evaluate and update her with results. Patient today has had some persistent SOB and continued O2 req, off BiPAP overnight, remained on NRB today, failed trial off NRB to [place] earlier, desat to 70 on RA. Work-up performed with ABG, BNP, repeat CXR, and now CTA chest. She recently had s/p tonsillectomy for R peritonsillar abscess by ENT over weekend on 12/07/14.  Currently patient remains on NRB, still complains of some shortness of breath. Recent history reveals episode of fever less than 1 week ago (likely tonsillar abscess), and several episodes of choking / gagging due to sore throat and difficulty swallowing, also similar episode post-op. Denies prior history of DVT/PE, immobilization other than recent surgery/hospitalization.  Denies significant productive cough  O: BP 150/85 mmHg  Pulse 116  Temp(Src) 98 F (36.7 C) (Axillary)  Resp 21  Ht 5\' 2"  (1.575 m)  Wt 245 lb 2.4 oz (111.2 kg)  BMI 44.83 kg/m2  SpO2 93%   Gen - sitting up in bed, well-appearing, cooperative, NAD HEENT - NRB in place, oropharynx clear without bleeding or swelling s/p tonsillectomy, MMM Heart - tachycardic, no murmurs heard Lungs - Persistent diffuse bilateral crackles R>L mid lung fields and bibasilar, no wheezing. Decent air movement, speaks full sentences Ext - non-tender, no edema, peripheral pulses intact +2 b/l Skin - warm, dry, no rashes Neuro - awake, alert, oriented  A/P: Briefly, Kristin [name] is a 60 yr F admitted on 6/4 for dysphagia found to have R-peritonsillar abscess s/p tonsillectomy on 6/5 by ENT Dr. Simeon Craft.  # Hypoxia, Dyspnea secondary to bilateral pulm infiltrates suggestive of multifocal HCAP POD#1 s/p tonsillectomy, complicated course post-op req BiPAP overnight for persistent hypoxia, transitioned to NRB unable to wean. Initial concerns on 6/5 included possible pulm edema s/p lasix IV x 1 dose without improvement,  clinically seemed more euvolemic without edema or evidence of overload, and no known CHF history. Additional concerns included post-op PE (hypoxia, tachy) vs pneumonia (?aspiration with choking, difficult to tell WBC and fevers w/ tonsillar abscess and on abx). - Failed trial off NRB, confirmed persistent hypoxia - ABG pH 7.390 / CO2 49.5, O2 60.6 - non-acidotic, low O2, not retaining CO2, elevated serum bicarb 30 - BNP 119 (no prior comparison, slightly above 100, can't completely rule out pulm edema, but seems less likely - Repeat CXR with diffuse interstitial infiltrates bilaterally, no effusions - stat Chest CTA r/o PE - no evidence acute PE. Bilateral upper/lower airspace opacities likely multifocal PNA - Vancomycin IV per pharm - MRSA, HCAP coverage - Zosyn IV per pharm - anaerobe (aspiration), GNR HCAP coverage - also cover tonsillar infxn - Discontinued Clindamycin IV (per pharm - will anticipate transition to Clinda PO on discharge) - Ordered blood cultures x 2 (already on clinda) - Check lactic acid, pro-calcitonin per SDU protocol - Low threshold to consult PCCM if worsening resp status  # R-Peritonsillar abscess, s/p tonsillectomy - Followed by ENT (Dr. Simeon Craft, currently signed off), outpt f/u arranged - Clindamycin IV (6/4 >>) - Pain control - Diet clears  Olin Hauser, DO 12/08/2014, 7:11 PM PGY-2, [place] Medicine Service pager [phone]

## 2014-12-08 NOTE — Procedures (Signed)
Pt uncomfortable with the BIPAP. RT removed and placed pt on NRB.

## 2014-12-08 NOTE — Progress Notes (Signed)
Family Medicine Teaching Service Daily Progress Note Intern Pager: [phone]  Patient name: Kristin [name] Medical record number: [id number] Date of birth: Dec 07, 1955 Age: 59 y.o. Gender: female  Primary Care Provider: Tommi Rumps, MD Consultants: ENT Code Status: Full code  Pt Overview and Major Events to Date:  6/4: I&D and tonsilectomy 6/5: Bipap after surgery. Difficulty with wean. Required Lasix 20mg , Ativan 0.5mg  for anxiety with improvement > NRB.   Assessment and Plan: Kristin [name] is a 59 y.o. female presenting with difficulty swallowing . 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 HR 117-126 in ED. No O2 requirement. CT neck with evidence of R palatine abscess 2.2x1.4cm and surrounding soft tissue edema. I&D attempted in ED.POD #1 from bilateral tonsillectomy.  - post surgery patient required Bipap. Continue to wean as tolerated, currently on a NRB satting (92-96%) - ENT recommends: continuing clindamycin, clears until discharge,  Prednisolone with taper (Dr. Simeon Craft states he called in clinda and prednisolone taper). He would also like her to have low dose pain medication (hydrocodone-APAP and ODT zofran on DC. Pt does have prolonged Qtc, if not resolved prior to dc could consider phenergan in place of zofran.  - EKG ordered to assess QTc  -Dilaudid 0.5mg  q3 PRN severe pain (4 doses in 24hrs).  - Will transition to Hydrocodone -APAP solution today. -Promethazine PRN Nausea - WBC today improved 19.1>17.1>14.0, Hgb stable  Tachycardia: has a h/o this. Patient was scheduled to have echo and myoview done this upcoming week with Dr Caryl Comes. EKG with sinus tach and prolonged QTc 539. Metoprolol 25 mg BID at home -Patient may need to reschedule cardiac evaluation -Metoprolol 2.5mg  q6, transition to oral once able - ekg ordered  this AM  Hypokalemia: likely 2/2 decreased PO. K 2.9 in ED. On HCTZ at home. 52mEq of K IV given in ED.  -hold HCTZ for now. -K 51mEq was in IVFs, now discontinued -BMET improved to 3.7 today - continue to monitor  HTN: normotensive in ED.   -Discontinue Metoprolol (IV), will restart Coreg  -Hold HCTZ pending BPs on home Coreg given poor PO intake  GERD: Prilosec at home -Protonix while hospitalized  Depression:  -Resume home Amitriptyline once able to PO  Renal sclerosis: Cr at baseline on admission (0.91-1.06). Only has one kidney. CT neck was performed with contrast. - Cr stable today (0.88)  FEN/GI: Clears while hospitalized, tonsillectomy diet on D/C, PPI Prophylaxis: SCDs for now, consider heparin if unable to D/c soon.  Disposition: continue in SDU for close monitoring  Disposition: Home pending clinical improvement with ENT f/u 3-4 weeks  Subjective:  Patient more alert this AM. Pain still present but significantly improved. Mild pain with swallowing. Eager to try some warm liquids. Breathing okay but notes she's never required O2 at home.  Objective: Temp:  [97.6 F (36.4 C)-99.6 F (37.6 C)] 97.6 F (36.4 C) (06/06 0436) Pulse Rate:  [104-123] 114 (06/06 0436) Resp:  [13-32] 21 (06/06 0436) BP: (90-152)/(49-101) 143/82 mmHg (06/06 0436) SpO2:  [85 %-99 %] 96 % (06/06 0436) FiO2 (%):  [50 %-70 %] 60 % (06/05 2302) Weight:  [245 lb 2.4 oz (111.2 kg)] 245 lb 2.4 oz (111.2 kg) (06/06 0436) Physical Exam: General: Sitting up in bed alert in NAD  HEENT: Scleral anicteric. MMM, Uvula midline. Tonsils absent without bleeding.  Neck: full feeling throughout with mild TTP, +submandibular  LAD Cardiovascular: tachycardic, rhythm regular, no murmurs, rubs, gallops. No pitting edema. 2+ DP pulses Respiratory: No increased WOB. Mild bibasilar crackles. No wheezing/rhonchi. On NRB satting 92-93% with RR ~27 Abdomen: +BS, bese, NT/ND Ext: no erythema or edema, negative  homan's  Skin: no rashes noted  Neuro: follows commands, no focal deficits  Laboratory:  Recent Labs Lab 12/06/14 1515 12/06/14 1946 12/07/14 0549  WBC 19.1*  --  17.1*  HGB 12.3 12.6 11.6*  HCT 36.5 37.0 35.4*  PLT 260  --  219    Recent Labs Lab 12/06/14 1946 12/07/14 0549  NA 139 138  K 2.9* 3.4*  CL 100* 101  CO2  --  29  BUN 14 10  CREATININE 1.00 0.94  CALCIUM  --  8.6*  GLUCOSE 132* 166*    Imaging/Diagnostic Tests: Dg Neck Soft Tissue 6/4 Abnormal prevertebral soft tissue prominence at C2-3, new since the prior exams. The possibility of prevertebral abscess should be considered. Electronically Signed By: Lorriane Shire M.D. On: 12/06/2014 15:19   6/4 Dg Chest 2 View  IMPRESSION: Subsegmental atelectasis RIGHT middle lobe.  Ct Soft Tissue Neck W Contrast 12/06/2014 1. Evolving 2.2 x 1.4 cm abscess at the deep portion of the right palatine tonsil, with associated diffuse soft tissue edema and right-sided effacement of the upper hypopharynx. Soft tissue edema extends inferiorly to the level of the valleculae and piriform sinuses, with effacement of the left piriform sinus. Soft tissue edema also extends superiorly along the right side of the oropharynx. 2. Prevertebral soft tissue edema extends to the inferior hypopharynx, with minimal prevertebral soft tissue thickening. Diffuse asymmetric soft tissue inflammation at the right parapharyngeal fat planes. 3. Mild asymmetric prominence of the right submandibular gland, with surrounding soft tissue inflammation tracking about the right side of the neck. Minimal soft tissue stranding extends to the inferior floor of the mouth, without significant Ludwig's angina at this time. 4. Mildly enlarged bilateral cervical nodes seen, measuring up to 1.3 cm in short axis. These are somewhat more prominent on the right. 5. Mild degenerative change noted along the cervical spine.   Archie Patten, MD 12/08/2014, 6:29 AM PGY-1,  [place] Intern pager: [phone], text pages welcome

## 2014-12-08 NOTE — Procedures (Signed)
Pt placed on NIV with the Servo.  Pt is alert and watching TV.  Rt will continue to monitor the pt.

## 2014-12-09 ENCOUNTER — Ambulatory Visit: Payer: No Typology Code available for payment source | Attending: Family Medicine | Admitting: Physical Therapy

## 2014-12-09 ENCOUNTER — Telehealth (HOSPITAL_COMMUNITY): Payer: Self-pay

## 2014-12-09 ENCOUNTER — Encounter (HOSPITAL_COMMUNITY): Payer: Self-pay

## 2014-12-09 DIAGNOSIS — R7989 Other specified abnormal findings of blood chemistry: Secondary | ICD-10-CM

## 2014-12-09 DIAGNOSIS — R9389 Abnormal findings on diagnostic imaging of other specified body structures: Secondary | ICD-10-CM | POA: Insufficient documentation

## 2014-12-09 DIAGNOSIS — J9601 Acute respiratory failure with hypoxia: Secondary | ICD-10-CM | POA: Insufficient documentation

## 2014-12-09 DIAGNOSIS — R938 Abnormal findings on diagnostic imaging of other specified body structures: Secondary | ICD-10-CM

## 2014-12-09 DIAGNOSIS — R0902 Hypoxemia: Secondary | ICD-10-CM | POA: Insufficient documentation

## 2014-12-09 LAB — CBC
HCT: 35.5 % — ABNORMAL LOW (ref 36.0–46.0)
Hemoglobin: 11.8 g/dL — ABNORMAL LOW (ref 12.0–15.0)
MCH: 29.9 pg (ref 26.0–34.0)
MCHC: 33.2 g/dL (ref 30.0–36.0)
MCV: 89.9 fL (ref 78.0–100.0)
Platelets: 205 10*3/uL (ref 150–400)
RBC: 3.95 MIL/uL (ref 3.87–5.11)
RDW: 13.3 % (ref 11.5–15.5)
WBC: 12.9 10*3/uL — AB (ref 4.0–10.5)

## 2014-12-09 LAB — BLOOD GAS, ARTERIAL
Acid-Base Excess: 6 mmol/L — ABNORMAL HIGH (ref 0.0–2.0)
Bicarbonate: 30.1 mEq/L — ABNORMAL HIGH (ref 20.0–24.0)
DRAWN BY: 270221
FIO2: 1 %
O2 SAT: 96.1 %
PATIENT TEMPERATURE: 97.9
PCO2 ART: 44.2 mmHg (ref 35.0–45.0)
TCO2: 31.5 mmol/L (ref 0–100)
pH, Arterial: 7.446 (ref 7.350–7.450)
pO2, Arterial: 83.3 mmHg (ref 80.0–100.0)

## 2014-12-09 LAB — TROPONIN I
Troponin I: 0.06 ng/mL — ABNORMAL HIGH (ref <0.031)
Troponin I: <0.03 ng/mL (ref <0.031)

## 2014-12-09 LAB — BASIC METABOLIC PANEL
Anion gap: 8 (ref 5–15)
BUN: 12 mg/dL (ref 6–20)
CHLORIDE: 101 mmol/L (ref 101–111)
CO2: 30 mmol/L (ref 22–32)
Calcium: 8.5 mg/dL — ABNORMAL LOW (ref 8.9–10.3)
Creatinine, Ser: 0.83 mg/dL (ref 0.44–1.00)
GFR calc non Af Amer: >60 mL/min (ref >60)
Glucose, Bld: 139 mg/dL — ABNORMAL HIGH (ref 65–99)
Potassium: 3.3 mmol/L — ABNORMAL LOW (ref 3.5–5.1)
Sodium: 139 mmol/L (ref 135–145)

## 2014-12-09 MED ORDER — GUAIFENESIN ER 600 MG PO TB12: 600.0000 mg | ORAL_TABLET | Freq: Two times a day (BID) | ORAL | Status: DC | PRN

## 2014-12-09 MED ORDER — ASPIRIN 81 MG PO CHEW
81.0000 mg | CHEWABLE_TABLET | Freq: Every day | ORAL | Status: DC
  Administered 2014-12-09 – 2014-12-14 (×6): 81 mg via ORAL
  Filled 2014-12-09 (×6): qty 1

## 2014-12-09 MED ORDER — POTASSIUM CHLORIDE CRYS ER 20 MEQ PO TBCR
40.0000 meq | EXTENDED_RELEASE_TABLET | Freq: Two times a day (BID) | ORAL | Status: DC
  Administered 2014-12-09 – 2014-12-11 (×6): 40 meq via ORAL
  Filled 2014-12-09 (×8): qty 2

## 2014-12-09 MED ORDER — DILTIAZEM HCL 30 MG PO TABS
30.0000 mg | ORAL_TABLET | Freq: Four times a day (QID) | ORAL | Status: DC
  Filled 2014-12-09 (×5): qty 1

## 2014-12-09 MED ORDER — HEPARIN SODIUM (PORCINE) 5000 UNIT/ML IJ SOLN
5000.0000 [IU] | Freq: Three times a day (TID) | INTRAMUSCULAR | Status: DC
  Administered 2014-12-09 – 2014-12-14 (×16): 5000 [IU] via SUBCUTANEOUS
  Filled 2014-12-09 (×18): qty 1

## 2014-12-09 MED ORDER — METOPROLOL TARTRATE 25 MG PO TABS
25.0000 mg | ORAL_TABLET | Freq: Two times a day (BID) | ORAL | Status: DC
  Administered 2014-12-09 – 2014-12-14 (×10): 25 mg via ORAL
  Filled 2014-12-09 (×10): qty 1

## 2014-12-09 NOTE — Progress Notes (Signed)
Advised Dr. Teryl Lucy of elevated hr of 120. No orders at this time. Is  Scheduled for metoprol at hs tonight

## 2014-12-09 NOTE — Consult Note (Signed)
PULMONARY / CRITICAL CARE MEDICINE   Name: Kristin [name] MRN: [id number] DOB: [birth year]    ADMISSION DATE:  12/06/2014 CONSULTATION DATE:  12/09/2014  REFERRING MD :  Mcdiarmid  CHIEF COMPLAINT:  SOB  INITIAL PRESENTATION:  59 y.o. F admitted with sore throat, had I&D of right PTA and bilateral tonsillectomy 12/07/14 (Dr. Simeon Craft - ENT).  Had SOB post op and CXR / CT with bilateral opacities concerning for multifocal PNA vs ARDS.  PCCM consulted for recs 6/7.   STUDIES:  CT neck 6/4 >>> evolving 2.2 x 1.4 cm abscess at the deep portion of the right palatine tonsil, with associated soft tissue edema.  Mildly enlarged b/l cervical nodes.CXR 6/6 >>> diffuse interstitial infiltrates bilaterally CTA chest 6/6 >>> neg for PE, bilateral upper and lower lobe opacities compatible with multifocal PNA.  SIGNIFICANT EVENTS: 6/4 - admitted with sore throat, dysphagia / odynophagia.  I&D of right PTA (Dr. Simeon Craft - ENT). 6/5 - bilateral tonsillectomy (Dr. Simeon Craft - ENT). 6/7 - PCCM consulted for hypoxia and bilateral chest infiltrates.   HISTORY OF PRESENT ILLNESS:   Kristin [name] is a 59 y.o. F with PMH as outlined below.  She was admitted to [facility] on 6/4 with sore throat and dysphagia / odynophagia.  ENT was consulted for concerns of PTA and I&D of right PTA was performed in ED (Dr. Simeon Craft).  There was minimal drainage during I&D and roughly 6 to 7 hours later, pt had persistent pain; therefore a CT of the neck was obtained which revealed a 1 - 1.5cm inferior peritonsillar vs intratonsillar abscess.  ENT was called back and pt was taken to the OR for bilateral tonsillectomy (early AM 12/07/14).  Following procedure, she was placed on BiPAP briefly.  She was able to be transitioned to [place]; however, the following day, she required BiPAP again due to increased WOB and intermittent desaturations.  It was felt that her symptoms were due to fluid overload; therefore, she was diuresed which had good improvement in her  symptoms and pt able to be de-escalated to NRB.  On 6/6, pt continued to feel SOB off of NRB support.  She would desaturate to 70's on room air.  CXR revealed diffuse interstitial infiltrates bilaterally and CTA of the chest was negative for PE but did show bilateral infiltrates concerning for multifocal PNA.  She has been on empiric abx (Vanc / Zosyn).  Findings suspicious for multifocal PNA vs ARDS.  Symptoms persisted into the morning of 6/7; therefore, PCCM was consulted for further recs.  She is a former smoker who quite 8 months ago.  She denies any episodes of aspiration.  She denies fevers/chills/sweats.  She has had a cough and feels as if she has chest congestion; however, is not able to cough anything up yet.  She is asking for a decongestant in hopes that it would enable her to expel some of her congestion / mucus.  She has no chest pain and states that her breathing seems labored if she tries to take a deep breath.  At rest it is fine; however, she does feel as if she needs the NRB.  PAST MEDICAL HISTORY :   has a past medical history of Back pain, chronic; GERD (gastroesophageal reflux disease); Obesity; Tobacco abuse; Hypertension; Hyperlipidemia; Seizures; Depression; Somatization disorder; and Renal sclerosis, unspecified.  has past surgical history that includes Tubal ligation; Abdominal hysterectomy; Cholecystectomy; and Tonsillectomy (Bilateral, 12/06/2014). Prior to Admission medications   Medication Sig Start Date End Date  Taking? Authorizing Provider  acetaminophen (TYLENOL) 650 MG CR tablet Take 650 mg by mouth every 8 (eight) hours as needed for pain.   Yes Historical Provider, MD  amitriptyline (ELAVIL) 50 MG tablet Take 1 tablet (50 mg total) by mouth at bedtime. 04/22/14  Yes Leone Haven, MD  aspirin 81 MG tablet Take 81 mg by mouth daily.   Yes Historical Provider, MD  cyclobenzaprine (FLEXERIL) 10 MG tablet Take 1 tablet (10 mg total) by mouth 3 (three) times daily  as needed for muscle spasms. 03/20/14  Yes Leone Haven, MD  EQ LORATADINE 10 MG tablet TAKE ONE TABLET BY MOUTH ONCE DAILY 10/06/14  Yes Leone Haven, MD  ibuprofen (ADVIL,MOTRIN) 200 MG tablet Take 200 mg by mouth every 6 (six) hours as needed for fever.   Yes Historical Provider, MD  metoprolol tartrate (LOPRESSOR) 25 MG tablet Take 1 tablet (25 mg total) by mouth 2 (two) times daily. 12/03/14  Yes Deboraha Sprang, MD  naproxen (NAPROSYN) 500 MG tablet Take 1 tablet (500 mg total) by mouth 2 (two) times daily as needed for mild pain, moderate pain or headache (TAKE WITH MEALS.). 07/18/14  Yes Mercedes Camprubi-Soms, PA-C  omeprazole (PRILOSEC) 20 MG capsule TAKE ONE CAPSULE BY MOUTH ONCE DAILY 10/06/14  Yes Leone Haven, MD  PROVENTIL HFA 108 (90 BASE) MCG/ACT inhaler INHALE TWO PUFFS BY MOUTH EVERY 6 HOURS AS NEEDED FOR  WHEEZING  OR  SHORTNESS  OF  BREATH 10/17/14  Yes Leone Haven, MD  Pseudoephedrine-DM-GG-APAP (THERAFLU MAX-D COLD & FLU PO) Take 30 mLs by mouth every 6 (six) hours as needed (cold symptoms).   Yes Historical Provider, MD  carvedilol (COREG) 12.5 MG tablet Take 1 tablet (12.5 mg total) by mouth 2 (two) times daily with a meal. 12/02/14   Leone Haven, MD  clindamycin (CLEOCIN) 75 MG/5ML solution Take 20 mLs (300 mg total) by mouth 4 (four) times daily. 12/06/14   Al Corpus, PA-C  hydrochlorothiazide (HYDRODIURIL) 25 MG tablet Take 1 tablet (25 mg total) by mouth daily. 04/22/14   Leone Haven, MD  HYDROcodone-acetaminophen (HYCET) 7.5-325 mg/15 ml solution One 89mL dose Q6 hours as needed for moderate to severe pain. 12/06/14   Al Corpus, PA-C  prednisoLONE (PRELONE) 15 MG/5ML SOLN 45mg  for first two doses on first two days, 30mg  for second two doses on 3rd and fourth day, 20mg  for third two doses for fifth and sixth day, 10mg  for fourth two doses for seventh and 8th day, 5 mg for two doses for last days 12/06/14   Al Corpus, PA-C   Allergies   Allergen Reactions  . Fluconazole Swelling    REACTION: Face swelling  . Robitussin (Alcohol Free) [Guaifenesin] Palpitations    Chest pain    FAMILY HISTORY:  Family History  Problem Relation Age of Onset  . Family history unknown: Yes    SOCIAL HISTORY:  reports that she quit smoking about 10 months ago. Her smoking use included Cigarettes. She has a .125 pack-year smoking history. She does not have any smokeless tobacco history on file. She reports that she does not drink alcohol or use illicit drugs.  REVIEW OF SYSTEMS:   All negative; except for those that are bolded, which indicate positives.  Constitutional: weight loss, weight gain, night sweats, fevers, chills, fatigue, weakness.  HEENT: headaches, sore throat, sneezing, nasal congestion, post nasal drip, difficulty swallowing, tooth/dental problems, visual complaints, visual changes, ear aches. Neuro: difficulty with speech,  weakness, numbness, ataxia. CV:  chest pain, orthopnea, PND, swelling in lower extremities, dizziness, palpitations, syncope.  Resp: cough, hemoptysis, dyspnea, wheezing. GI  heartburn, indigestion, abdominal pain, nausea, vomiting, diarrhea, constipation, change in bowel habits, loss of appetite, hematemesis, melena, hematochezia.  GU: dysuria, change in color of urine, urgency or frequency, flank pain, hematuria. MSK: joint pain or swelling, decreased range of motion. Psych: change in mood or affect, depression, anxiety, suicidal ideations, homicidal ideations. Skin: rash, itching, bruising.   SUBJECTIVE:  Feels that breathing is labored if she takes a deep breath.  Feels more comfortable with NRB.  VITAL SIGNS: Temp:  [97.9 F (36.6 C)-99.6 F (37.6 C)] 99.6 F (37.6 C) (06/07 0845) Pulse Rate:  [110-125] 112 (06/07 0845) Resp:  [21-31] 23 (06/07 0845) BP: (100-157)/(43-97) 100/43 mmHg (06/07 0845) SpO2:  [89 %-96 %] 92 % (06/07 0845) HEMODYNAMICS:   VENTILATOR SETTINGS:   INTAKE /  OUTPUT: Intake/Output      06/06 0701 - 06/07 0700 06/07 0701 - 06/08 0700   P.O. 50    I.V. (mL/kg) 3 (0) 3 (0)   IV Piggyback 50    Total Intake(mL/kg) 103 (0.9) 3 (0)   Urine (mL/kg/hr) 1300 (0.5) 300 (0.7)   Total Output 1300 300   Net -1197 -297          PHYSICAL EXAMINATION: General: Pleasant adult AA female, in NAD. Neuro: A&O x 3, non-focal.  HEENT: [place]/AT. PERRL, sclerae anicteric.  NRB in place. Cardiovascular: Tachy, regular, no M/R/G.  Lungs: Respirations even and unlabored.  Bibasilar crackles. Abdomen: BS x 4, soft, NT/ND.  Musculoskeletal: No gross deformities, no edema.  Skin: Intact, warm, no rashes.  LABS:  CBC  Recent Labs Lab 12/07/14 0549 12/08/14 0700 12/09/14 0043  WBC 17.1* 14.0* 12.9*  HGB 11.6* 10.9* 11.8*  HCT 35.4* 33.8* 35.5*  PLT 219 263 205   Coag's No results for input(s): APTT, INR in the last 168 hours. BMET  Recent Labs Lab 12/07/14 0549 12/08/14 0700 12/09/14 0133  NA 138 139 139  K 3.4* 3.7 3.3*  CL 101 100* 101  CO2 29 30 30   BUN 10 15 12   CREATININE 0.94 0.88 0.83  GLUCOSE 166* 188* 139*   Electrolytes  Recent Labs Lab 12/07/14 0549 12/08/14 0700 12/09/14 0133  CALCIUM 8.6* 8.8* 8.5*   Sepsis Markers  Recent Labs Lab 12/08/14 2020 12/08/14 2240  LATICACIDVEN 1.3 1.6  PROCALCITON 0.35  --    ABG  Recent Labs Lab 12/08/14 1413 12/09/14 0840  PHART 7.390 7.446  PCO2ART 49.5* 44.2  PO2ART 60.6* 83.3   Liver Enzymes No results for input(s): AST, ALT, ALKPHOS, BILITOT, ALBUMIN in the last 168 hours. Cardiac Enzymes  Recent Labs Lab 12/08/14 2020 12/09/14 0133 12/09/14 0740  TROPONINI 0.04* 0.06* <0.03   Glucose No results for input(s): GLUCAP in the last 168 hours.  Imaging Dg Chest 2 View  12/08/2014   CLINICAL DATA:  Shortness of breath for 1 day  EXAM: CHEST - 2 VIEW  COMPARISON:  12/06/2014  FINDINGS: The cardiac shadow is stable. The lungs are well aerated bilaterally. Increasing  peribronchial changes are noted bilaterally. No focal confluent infiltrate is seen. This may represent a component of CHF although an infectious etiology cannot be excluded.  IMPRESSION: Diffuse interstitial infiltrate bilaterally. No sizable effusion is noted.   Electronically Signed   By: Inez Catalina M.D.   On: 12/08/2014 15:12   Ct Angio Chest Pe W/cm &/or Wo Cm  12/08/2014  CLINICAL DATA:  Recent tonsillectomy. Hypoxia. Evaluate for infiltrate and PE  EXAM: CT ANGIOGRAPHY CHEST WITH CONTRAST  TECHNIQUE: Multidetector CT imaging of the chest was performed using the standard protocol during bolus administration of intravenous contrast. Multiplanar CT image reconstructions and MIPs were obtained to evaluate the vascular anatomy.  CONTRAST:  114mL OMNIPAQUE IOHEXOL 350 MG/ML SOLN  COMPARISON:  None  FINDINGS: Mediastinum: Normal heart size. No pericardial effusion. The trachea is patent and appears midline. Normal appearance of the esophagus. No mediastinal or hilar adenopathy. The main pulmonary artery appears normal and patent.  Lungs/Pleura: No pleural effusion identified. Bilateral airspace opacities are identified involving in the upper and lower lobes. No atelectasis.  Upper Abdomen: There is asymmetric atrophy of the right kidney. Visualized portions of the left kidney are unremarkable. The visualized portions of the spleen and liver are normal.  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  Review of the MIP images confirms the above findings.  IMPRESSION: 1. No evidence for acute pulmonary embolus. 2. Bilateral upper and lower lobe airspace opacities compatible with multifocal pneumonia.   Electronically Signed   By: Kerby Moors M.D.   On: 12/08/2014 18:35     ASSESSMENT / PLAN:  INFECTIOUS A:   PTA vs intratonsillar abscess - s/p right PTA I&D as well as b/l tonsillectomy on 6/4 and 6/5 (Dr. Simeon Craft - ENT). Concern for multifocal PNA - ? Aspiration  during her dysphagia due to abscess. P:   BCx2 6/6 > Sputum Cx 6/6 > GAS 6/4 >>> POS Abx: Vanc, start date 6/6, day 2/x. Abx: Zosyn, start date 6/6, day 2/x. ENT follow up as outpatient in 3 - 4 weeks.  PULMONARY A: Acute hypoxic respiratory failure - appears comfortable with SpO2 of 97% on NRB. Multifocal PNA vs ARDS. ? Aspiration. Former smoker. P:   Continue supplemental O2 as needed to maintain SpO2 > 92%. BiPAP PRN. Continue empiric abx. Obtain sputum culture. Add mucinex. Flutter valve / incentive spirometry. Consider chest PT. Pulmonary hygiene. CXR in AM.  Rest per primary.   She does not appear to be in distress and does not require transfer to ICU for potential intubation at this time.  Would continue NRB and supportive care for now.  If she deteriorates at all, would certainly be open to transferring to ICU and considering intubation.   Montey Hora, [place] [facility] Pager: [phone]  or [phone] 12/09/2014, 10:54 AM  PCCM ATTENDING: I have reviewed pt's initial presentation, consultants notes and hospital database in detail.  The above assessment and plan was formulated under my direction.  In summary: Hx is as documented above She has developed hypoxic resp failure with extensive bilateral alveolar infiltrates on CT chest which I have personally reviewed This is likely aspiration PNA vs ARDS Need to r/o pulmonary edema noting that she was to undergo cardiac eval in near future  Cont supplemental O2 Does not need ventilatory support @ this time but need to monitor closely Cont current abx Check pro-BNP Follow CXR We will follow with you   Merton Border, MD;  PCCM service; Mobile [phone]

## 2014-12-09 NOTE — Progress Notes (Signed)
In room to give Cardizem po and heartrate 111 and b/p 92/48.. Family medicine paged. Pt asymptomatic.

## 2014-12-09 NOTE — Progress Notes (Signed)
Family Medicine Teaching Service Daily Progress Note Intern Pager: [phone]  Patient name: Kristin [name] Medical record number: [id number] Date of birth: 1955-12-27 Age: 59 y.o. Gender: female  Primary Care Provider: Tommi Rumps, MD Consultants: ENT Code Status: Full code  Pt Overview and Major Events to Date:  6/4: I&D and tonsilectomy 6/5: Bipap after surgery. Difficulty with wean. Required Lasix 20mg , Ativan 0.5mg  for anxiety with improvement > NRB.  6/6: Multifocal PNA: switched from clinda to Zosyn and Vanc  Assessment and Plan: Kristin [name] is a 59 y.o. female presenting with difficulty swallowing . 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 HR 117-126 in ED. CT neck with evidence of R palatine abscess 2.2x1.4cm and surrounding soft tissue edema. I&D attempted in ED.POD #2 from bilateral tonsillectomy. - ENT recommends: continuing clindamycin, clears until discharge,  Prednisolone with taper (Dr. Simeon Craft states he called in clinda and prednisolone taper). He would also like her to have low dose pain medication (hydrocodone-APAP and ODT zofran on DC. Pt does have prolonged Qtc, if not resolved prior to dc could consider phenergan in place of zofran.  - Continue  Hydrocodone -APAP solution today. -Promethazine PRN Nausea - Continue on clears, will be discharged with s/p tonsillectomy diet - WBC today improved 19.1>17.1>14.0>12.9, Hgb stable  Respiratory failure: Post surgery the patient required BiPAP with no h/o O2 requirement prior.  BNP slightly elevated to 119.7. CXR prior to surgery revealed atelectasis on the R. Repeat s/p surgery revealed diffuse interstitial infiltrate. Given concerns for PE and need to further characterize infiltrate, CTA was obtained which looked c/w multifocal PNA without evidence of PE.  Troponins have slowly  increased: 0.04 >0.90 >0.03. Lactic acid was normal and procalcitonin was 0.35  Could be ARDS given acute drop an h/o recent surgery.  Currently on a NRB satting (92-96%) - Blood cultures obtained and pending  - Continue to wean as tolerated - Switched from [place] to Vanc/Zosyn (D1/7) - EKG this AM with no concerns for ACS - Low threshold to call CCM if worsening status - will consult cardiology  -echo ordered  Tachycardia: has a h/o this from reviewing old notes, however could be secondary to infection. Patient was scheduled to have echo and myoview done this upcoming week with Dr Caryl Comes. EKG with sinus tach and prolonged QTc 539. Infectious vs respiratory distress vs baseline (was supposed to have work up ) Metoprolol 25 mg BID at home -Patient has reschedule cardiac evaluation - continue to monitor   Hypokalemia: likely 2/2 decreased PO. K 2.9 in ED. On HCTZ at home. 64mEq of K IV given in ED.  -hold HCTZ for now. -K decreased from 3.7 >3.3  - Replete with KDUR 23mEq BID x 2 doses if tolerated - continue to monitor  HTN: normotensive currently - restarted home metop on 6/6  -Hold HCTZ pending BP,s on home metoprolol  GERD: Prilosec at home -Protonix while hospitalized  Depression:  -Home Amitriptyline once able to PO  Renal sclerosis: Cr at baseline on admission (0.91-1.06). Only has one fucntioning kidney. CT neck was performed with contrast. - Cr stable today (0.83) - Will continue to monitor   FEN/GI: Clears while hospitalized, tonsillectomy diet on D/C, PPI Prophylaxis:  SQ heparin  Disposition: continue in SDU for close monitoring  Disposition: Home pending clinical improvement with ENT f/u 3-4 weeks  Subjective:  Patient with some  SOB intermittently, feels its stable. Pain controlled. Swallowing ok. No bleeding.   Objective: Temp:  [97.9 F (36.6 C)-99 F (37.2 C)] 97.9 F (36.6 C) (06/07 0400) Pulse Rate:  [110-125] 123 (06/07 0618) Resp:  [18-31] 30  (06/07 0400) BP: (115-157)/(62-97) 140/77 mmHg (06/07 0618) SpO2:  [89 %-96 %] 95 % (06/07 0400) Physical Exam: General: Sitting up in bed alert in NAD eating HEENT: Scleral anicteric. MMM, Uvula midline. Tonsils absent without bleeding.  Neck: Mild TTP, +submandibular LAD Cardiovascular: tachycardic, rhythm regular, no murmurs, rubs, gallops. No pitting edema. 2+ DP pulses Respiratory: No increased WOB. Coarse crackles noted from midback down bilaterally. No wheezing/rhonchi noted. On NRB satting 92-93% with RR ~30 Abdomen: +BS, bese, NT/ND Ext: no erythema or edema, negative homan's  Skin: no rashes noted  Neuro: follows commands, no focal deficits  Laboratory:  Recent Labs Lab 12/07/14 0549 12/08/14 0700 12/09/14 0043  WBC 17.1* 14.0* 12.9*  HGB 11.6* 10.9* 11.8*  HCT 35.4* 33.8* 35.5*  PLT 219 263 205    Recent Labs Lab 12/07/14 0549 12/08/14 0700 12/09/14 0133  NA 138 139 139  K 3.4* 3.7 3.3*  CL 101 100* 101  CO2 29 30 30   BUN 10 15 12   CREATININE 0.94 0.88 0.83  CALCIUM 8.6* 8.8* 8.5*  GLUCOSE 166* 188* 139*    Imaging/Diagnostic Tests: Dg Neck Soft Tissue 6/4 Abnormal prevertebral soft tissue prominence at C2-3, new since the prior exams. The possibility of prevertebral abscess should be considered. Electronically Signed By: Lorriane Shire M.D. On: 12/06/2014 15:19   6/4 Dg Chest 2 View  IMPRESSION: Subsegmental atelectasis RIGHT middle lobe.   Ct Soft Tissue Neck W Contrast 12/06/2014 1. Evolving 2.2 x 1.4 cm abscess at the deep portion of the right palatine tonsil, with associated diffuse soft tissue edema and right-sided effacement of the upper hypopharynx. Soft tissue edema extends inferiorly to the level of the valleculae and piriform sinuses, with effacement of the left piriform sinus. Soft tissue edema also extends superiorly along the right side of the oropharynx. 2. Prevertebral soft tissue edema extends to the inferior hypopharynx, with  minimal prevertebral soft tissue thickening. Diffuse asymmetric soft tissue inflammation at the right parapharyngeal fat planes. 3. Mild asymmetric prominence of the right submandibular gland, with surrounding soft tissue inflammation tracking about the right side of the neck. Minimal soft tissue stranding extends to the inferior floor of the mouth, without significant Ludwig's angina at this time. 4. Mildly enlarged bilateral cervical nodes seen, measuring up to 1.3 cm in short axis. These are somewhat more prominent on the right. 5. Mild degenerative change noted along the cervical spine.   6/6 Dg Chest 2 View Diffuse interstitial infiltrate bilaterally. No sizable effusion is noted.  6/6 CTA chest  1. No evidence for acute pulmonary embolus. 2. Bilateral upper and lower lobe airspace opacities compatible with multifocal pneumonia.  Archie Patten, MD 12/09/2014, 6:33 AM PGY-1, [place] Intern pager: [phone], text pages welcome

## 2014-12-09 NOTE — Consult Note (Signed)
CARDIOLOGY CONSULT NOTE   Patient ID: Kristin [name] MRN: [id number], DOB/AGE: [birth year]   Admit date: 12/06/2014 Date of Consult: 12/09/2014   Primary Physician: Tommi Rumps, MD Primary Cardiologist: Dr. Virl Axe  History of present illness  59 year old woman admitted after tonsillectomy on 6/516 for acute tonsillitis with right peritonsillar abscess.Marland Kitchen  She was admitted because of worsening dyspnea. Postoperatively she has had persistent sinus tachycardia.  Blood pressure has been borderline low.  Chest CT on 12/08/14 showed bilateral infiltrates suggestive of multifocal pneumonia. The patient has a past history of syncope.  She was recently seen by Dr. Virl Axe in the office.  Plans were made for her to have a 48 hour Holter monitor and also a Myoview stress test.  These have not yet been completed. The patient denies any history of chest pain.  She does have a past history of hypertension but no known prior history of structural heart disease.  An echocardiogram has been ordered and is pending. The patient has a family history of heart disease in her father who had coronary artery disease. Problem List  Past Medical History  Diagnosis Date  . Back pain, chronic   . GERD (gastroesophageal reflux disease)   . Obesity   . Tobacco abuse   . Hypertension   . Hyperlipidemia   . Seizures   . Depression   . Somatization disorder   . Renal sclerosis, unspecified     only has 1 kidney    Past Surgical History  Procedure Laterality Date  . Tubal ligation    . Abdominal hysterectomy    . Cholecystectomy    . Tonsillectomy Bilateral 12/06/2014    Procedure: TONSILLECTOMY, BILATERAL;  Surgeon: Ruby Cola, MD;  Location: [facility] OR;  Service: ENT;  Laterality: Bilateral;     Allergies  Allergies  Allergen Reactions  . Fluconazole Swelling    REACTION: Face swelling  . Robitussin (Alcohol Free) [Guaifenesin] Palpitations    Chest pain    Inpatient Medications  .  aminocaproic acid  10 mL Oral Q4H while awake  . amitriptyline  50 mg Oral QHS  . antiseptic oral rinse  7 mL Mouth Rinse BID  . metoprolol tartrate  25 mg Oral BID  . pantoprazole (PROTONIX) IV  40 mg Intravenous Q24H  . piperacillin-tazobactam (ZOSYN)  IV  3.375 g Intravenous 3 times per day  . potassium chloride  40 mEq Oral BID  . sodium chloride  3 mL Intravenous Q12H  . vancomycin  750 mg Intravenous Q12H    Family History Family History  Problem Relation Age of Onset  . Family history unknown: Yes     Social History History   Social History  . Marital Status: Married    Spouse Name: N/A  . Number of Children: N/A  . Years of Education: N/A   Occupational History  . Not on file.   Social History Main Topics  . Smoking status: Former Smoker -- 0.25 packs/day for .5 years    Types: Cigarettes    Quit date: 01/27/2014  . Smokeless tobacco: Not on file  . Alcohol Use: No  . Drug Use: No  . Sexual Activity: Not on file   Other Topics Concern  . Not on file   Social History Narrative     Review of Systems  General:  No chills, fever, night sweats or weight changes.  Cardiovascular:  No chest pain, dyspnea on exertion, edema, orthopnea, palpitations, paroxysmal nocturnal dyspnea. Dermatological: No rash, lesions/masses  Respiratory: No cough, dyspnea Urologic: No hematuria, dysuria Abdominal:   No nausea, vomiting, diarrhea, bright red blood per rectum, melena, or hematemesis Neurologic:  No visual changes, wkns, changes in mental status.  Past history of syncope and questionable remote history of seizure disorder, previously followed by Dr. Erling Cruz. All other systems reviewed and are otherwise negative except as noted above.  Physical Exam  Blood pressure 100/43, pulse 112, temperature 99.6 F (37.6 C), temperature source Axillary, resp. rate 23, height 5\' 2"  (1.575 m), weight 245 lb 2.4 oz (111.2 kg), SpO2 92 %.  General: Pleasant, NAD.  Mildly tachypneic but  in no acute distress Psych: Normal affect. Neuro: Alert and oriented X 3. Moves all extremities spontaneously. HEENT: Normal  Neck: Supple without bruits or JVD. Lungs:  There are fine inspiratory rales at both bases Heart: RRR no s3, s4, or murmurs. Abdomen: Soft, non-tender, non-distended, BS + x 4.  Extremities: No clubbing, cyanosis or edema. DP/PT/Radials 2+ and equal bilaterally.  No peripheral edema.  Pedal pulses are good  Labs   Recent Labs  12/08/14 2020 12/09/14 0133 12/09/14 0740  TROPONINI 0.04* 0.06* <0.03   Lab Results  Component Value Date   WBC 12.9* 12/09/2014   HGB 11.8* 12/09/2014   HCT 35.5* 12/09/2014   MCV 89.9 12/09/2014   PLT 205 12/09/2014     Recent Labs Lab 12/09/14 0133  NA 139  K 3.3*  CL 101  CO2 30  BUN 12  CREATININE 0.83  CALCIUM 8.5*  GLUCOSE 139*   Lab Results  Component Value Date   CHOL 209* 03/26/2014   HDL 70 03/26/2014   LDLCALC 127* 03/26/2014   TRIG 58 03/26/2014   Lab Results  Component Value Date   DDIMER  11/25/2009    0.26        AT THE INHOUSE ESTABLISHED CUTOFF VALUE OF 0.48 ug/mL FEU, THIS ASSAY HAS BEEN DOCUMENTED IN THE LITERATURE TO HAVE A SENSITIVITY AND NEGATIVE PREDICTIVE VALUE OF AT LEAST 98 TO 99%.  THE TEST RESULT SHOULD BE CORRELATED WITH AN ASSESSMENT OF THE CLINICAL PROBABILITY OF DVT / VTE.    Radiology/Studies  Dg Neck Soft Tissue  12/06/2014   CLINICAL DATA:  Sore throat.  Difficulty swallowing.  EXAM: NECK SOFT TISSUES - 1+ VIEW  COMPARISON:  MRI dated 11/27/2014 and cervical spine radiographs dated 02/27/2014  FINDINGS: There is increased soft tissue prominence anterior to C2 and C3 as compared to the prior exams. Tongue base and epiglottis appear unchanged.  Prominent anterior osteophytes in the cervical spine from C3 through C7-T1. Adenoids are not enlarged.  IMPRESSION: Abnormal prevertebral soft tissue prominence at C2-3, new since the prior exams. The possibility of prevertebral  abscess should be considered.   Electronically Signed   By: Lorriane Shire M.D.   On: 12/06/2014 15:19   Dg Chest 2 View  12/08/2014   CLINICAL DATA:  Shortness of breath for 1 day  EXAM: CHEST - 2 VIEW  COMPARISON:  12/06/2014  FINDINGS: The cardiac shadow is stable. The lungs are well aerated bilaterally. Increasing peribronchial changes are noted bilaterally. No focal confluent infiltrate is seen. This may represent a component of CHF although an infectious etiology cannot be excluded.  IMPRESSION: Diffuse interstitial infiltrate bilaterally. No sizable effusion is noted.   Electronically Signed   By: Inez Catalina M.D.   On: 12/08/2014 15:12   Dg Chest 2 View  12/06/2014   CLINICAL DATA:  Sick for 1 week with throat pain,  difficulty swallowing, loss of appetite, body aches, question throat abscess  EXAM: CHEST  2 VIEW  COMPARISON:  06/02/2014  FINDINGS: Normal heart size, mediastinal contours, and pulmonary vascularity.  Linear subsegmental atelectasis RIGHT middle lobe.  Lungs otherwise clear.  No pleural effusion or pneumothorax.  Scattered endplate spur formation thoracic spine.  IMPRESSION: Subsegmental atelectasis RIGHT middle lobe.   Electronically Signed   By: Lavonia Dana M.D.   On: 12/06/2014 15:17   Ct Soft Tissue Neck W Contrast  12/06/2014   CLINICAL DATA:  Acute onset of right-sided sore throat for 1 week, with difficulty swallowing. Initial encounter.  EXAM: CT NECK WITH CONTRAST  TECHNIQUE: Multidetector CT imaging of the neck was performed using the standard protocol following the bolus administration of intravenous contrast.  CONTRAST:  18mL OMNIPAQUE IOHEXOL 300 MG/ML  SOLN  COMPARISON:  MRI of the cervical spine performed 11/27/2014, and maxillofacial CT performed 12/14/2012  FINDINGS: Pharynx and larynx: Note is made of a 2.2 x 1.4 cm evolving abscess at the deep portion of the right palatine tonsil, with associated diffuse soft tissue edema and right-sided effacement of the upper  hypopharynx. Soft tissue edema extends inferiorly to the level of the valleculae and piriform sinuses, with effacement of the right piriform sinus. Soft tissue edema also extends superiorly along the right side of the oropharynx.  The nasopharynx is grossly unremarkable in appearance. The adenoids are within normal limits.  Soft tissue edema is noted along the prevertebral soft tissues, extending to the level of the inferior hypopharynx, with only minimal prevertebral thickening. Diffuse asymmetric inflammation is noted at the right parapharyngeal fat planes. The vocal cords are grossly unremarkable in appearance. The proximal trachea demonstrates mild tracheomalacia.  Salivary glands: There is mild asymmetric prominence of the right submandibular gland, with surrounding soft tissue inflammation tracking about the submandibular gland and along the right side of the neck. There is minimal soft tissue stranding extending to the inferior floor of the mouth, without significant Ludwig's angina at this time.  Thyroid: The thyroid gland is unremarkable in appearance.  Lymph nodes: Mildly enlarged cervical nodes are noted bilaterally, measuring up to 1.3 cm in short axis. These are somewhat more prominent on the right.  Vascular: There is no evidence of vascular compromise. The visualized vasculature is grossly unremarkable in appearance.  Limited intracranial: The visualized portions of the brain are grossly unremarkable.  Visualized orbits: The visualized portions of the orbits are within normal limits.  Mastoids and visualized paranasal sinuses: The visualized paranasal sinuses and mastoid air cells are well-aerated.  Skeleton: No acute osseous abnormalities are seen. Diffuse anterior osteophytes are seen along the cervical spine, without significant disc space narrowing.  Upper chest: The visualized lung apices are clear. The superior mediastinum is grossly unremarkable in appearance.  IMPRESSION: 1. Evolving 2.2 x 1.4  cm abscess at the deep portion of the right palatine tonsil, with associated diffuse soft tissue edema and right-sided effacement of the upper hypopharynx. Soft tissue edema extends inferiorly to the level of the valleculae and piriform sinuses, with effacement of the left piriform sinus. Soft tissue edema also extends superiorly along the right side of the oropharynx. 2. Prevertebral soft tissue edema extends to the inferior hypopharynx, with minimal prevertebral soft tissue thickening. Diffuse asymmetric soft tissue inflammation at the right parapharyngeal fat planes. 3. Mild asymmetric prominence of the right submandibular gland, with surrounding soft tissue inflammation tracking about the right side of the neck. Minimal soft tissue stranding extends to the  inferior floor of the mouth, without significant Ludwig's angina at this time. 4. Mildly enlarged bilateral cervical nodes seen, measuring up to 1.3 cm in short axis. These are somewhat more prominent on the right. 5. Mild degenerative change noted along the cervical spine.  These results were called by telephone at the time of interpretation on 12/06/2014 at 10:18 pm to [facility] PA, who verbally acknowledged these results.   Electronically Signed   By: Garald Balding M.D.   On: 12/06/2014 22:21   Ct Angio Chest Pe W/cm &/or Wo Cm  12/08/2014   CLINICAL DATA:  Recent tonsillectomy. Hypoxia. Evaluate for infiltrate and PE  EXAM: CT ANGIOGRAPHY CHEST WITH CONTRAST  TECHNIQUE: Multidetector CT imaging of the chest was performed using the standard protocol during bolus administration of intravenous contrast. Multiplanar CT image reconstructions and MIPs were obtained to evaluate the vascular anatomy.  CONTRAST:  16mL OMNIPAQUE IOHEXOL 350 MG/ML SOLN  COMPARISON:  None  FINDINGS: Mediastinum: Normal heart size. No pericardial effusion. The trachea is patent and appears midline. Normal appearance of the esophagus. No mediastinal or hilar adenopathy. The  main pulmonary artery appears normal and patent.  Lungs/Pleura: No pleural effusion identified. Bilateral airspace opacities are identified involving in the upper and lower lobes. No atelectasis.  Upper Abdomen: There is asymmetric atrophy of the right kidney. Visualized portions of the left kidney are unremarkable. The visualized portions of the spleen and liver are normal.  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  Review of the MIP images confirms the above findings.  IMPRESSION: 1. No evidence for acute pulmonary embolus. 2. Bilateral upper and lower lobe airspace opacities compatible with multifocal pneumonia.   Electronically Signed   By: Kerby Moors M.D.   On: 12/08/2014 18:35   Mr Cervical Spine Wo Contrast  11/27/2014   CLINICAL DATA:  Neck pain, radiates sella side of the head and shoulder area.  EXAM: MRI CERVICAL SPINE WITHOUT CONTRAST  TECHNIQUE: Multiplanar, multisequence MR imaging of the cervical spine was performed. No intravenous contrast was administered.  COMPARISON:  None.  FINDINGS: The cervical cord is normal in size and signal. Vertebral body heights are maintained. The disc spaces are preserved. There are anterior bridging osteophytes at C3-4, C4-5, C5-6 and C6-7. The cervical spine is normal in lordotic alignment. No static listhesis. Bone marrow signal is normal. Cerebellar tonsils are normal in position. There is a 12 mm right thyroid nodule of indeterminate etiology.  C2-3: No significant disc bulge. No neural foraminal stenosis. No central canal stenosis.  C3-4: No significant disc bulge. No neural foraminal stenosis. No central canal stenosis.  C4-5: Mild broad-based disc bulge. No neural foraminal stenosis. No central canal stenosis.  C5-6: No significant disc bulge. No neural foraminal stenosis. No central canal stenosis.  C6-7: Small central disc protrusion. No neural foraminal stenosis. No central canal stenosis.  C7-T1: No  significant disc bulge. No neural foraminal stenosis. No central canal stenosis.  IMPRESSION: 1. At C4-5 there is a mild broad-based disc bulge. 2. At C6-7 there is a small central disc protrusion. 3. 12 mm right thyroid nodule of indeterminate etiology. If there is further clinical concern, recommend a dedicated thyroid ultrasound.   Electronically Signed   By: Kathreen Devoid   On: 11/27/2014 16:12    ECG  09-Dec-2014 07:26:57 [facility] ROUTINE RECORD Sinus tachycardia Otherwise normal ECG Personally reviewed. ASSESSMENT AND PLAN  1.  Multifocal pneumonia 2.  Mild elevation of  troponins consistent with demand ischemia secondary to respiratory distress.  EKG does not show any evidence of ischemia.  History does not suggest a ACS. 3.  History of persistent sinus tachycardia, workup in progress as an outpatient.  Her Myoview scheduled for later this week as an outpatient will need to be rescheduled following discharge. 4.  History of prior syncopal episodes, often associated with suspected orthostasis. 5.  Hypokalemia, repletion per primary service  Recommendation: Continue aggressive treatment of her multifocal pneumonia postop peritonsillar abscess. Await results of echocardiogram scheduled for today. Continue low-dose metoprolol 25 mg twice a day.  Resume outpatient aspirin 81 mg daily. Obtain follow-up chest x-rays.   Berna Spare MD  12/09/2014, 10:41 AM

## 2014-12-09 NOTE — Telephone Encounter (Signed)
Patient called regarding Lexiscan Myocardial Perfusion for 12-11-2014. I spoke with Veatrice Bourbon. She is in the hospital at [facility], and had a tonsillectomy on 12-06-2014. Patient will call back to reschedule.Notified D. Remo Lipps [facility] Madelyn Flavors, RN. Irven Baltimore, RN

## 2014-12-09 NOTE — Plan of Care (Signed)
Problem: Phase I Progression Outcomes Goal: Pain controlled with appropriate interventions Outcome: Progressing Patient last given Hycet at 2054. Given magic mouth wash with lidocaine to help soothe throat pain. Still experiencing and rates her pain between 5-7/10. Goal: OOB as tolerated unless otherwise ordered Outcome: Not Met (add Reason) Patient currently scheduled for bedrest per MD.  Comments:  Kristin [name], Student-RN

## 2014-12-10 ENCOUNTER — Ambulatory Visit (HOSPITAL_COMMUNITY): Payer: Self-pay

## 2014-12-10 ENCOUNTER — Inpatient Hospital Stay (HOSPITAL_COMMUNITY): Payer: Self-pay

## 2014-12-10 DIAGNOSIS — J80 Acute respiratory distress syndrome: Secondary | ICD-10-CM | POA: Insufficient documentation

## 2014-12-10 DIAGNOSIS — I248 Other forms of acute ischemic heart disease: Secondary | ICD-10-CM

## 2014-12-10 DIAGNOSIS — R000 Tachycardia, unspecified: Secondary | ICD-10-CM

## 2014-12-10 DIAGNOSIS — R0600 Dyspnea, unspecified: Secondary | ICD-10-CM

## 2014-12-10 LAB — BASIC METABOLIC PANEL
Anion gap: 9 (ref 5–15)
BUN: 9 mg/dL (ref 6–20)
CO2: 29 mmol/L (ref 22–32)
Calcium: 8.6 mg/dL — ABNORMAL LOW (ref 8.9–10.3)
Chloride: 98 mmol/L — ABNORMAL LOW (ref 101–111)
Creatinine, Ser: 0.74 mg/dL (ref 0.44–1.00)
GFR calc Af Amer: >60 mL/min (ref >60)
Glucose, Bld: 142 mg/dL — ABNORMAL HIGH (ref 65–99)
POTASSIUM: 3.8 mmol/L (ref 3.5–5.1)
SODIUM: 136 mmol/L (ref 135–145)

## 2014-12-10 LAB — CBC
HEMATOCRIT: 31.3 % — AB (ref 36.0–46.0)
Hemoglobin: 10.1 g/dL — ABNORMAL LOW (ref 12.0–15.0)
MCH: 29.1 pg (ref 26.0–34.0)
MCHC: 32.3 g/dL (ref 30.0–36.0)
MCV: 90.2 fL (ref 78.0–100.0)
PLATELETS: 230 10*3/uL (ref 150–400)
RBC: 3.47 MIL/uL — ABNORMAL LOW (ref 3.87–5.11)
RDW: 13.3 % (ref 11.5–15.5)
WBC: 15.1 10*3/uL — AB (ref 4.0–10.5)

## 2014-12-10 LAB — PROCALCITONIN: PROCALCITONIN: 0.31 ng/mL

## 2014-12-10 LAB — BRAIN NATRIURETIC PEPTIDE: B NATRIURETIC PEPTIDE 5: 59 pg/mL (ref 0.0–100.0)

## 2014-12-10 MED ORDER — PNEUMOCOCCAL VAC POLYVALENT 25 MCG/0.5ML IJ INJ
0.5000 mL | INJECTION | INTRAMUSCULAR | Status: AC
  Administered 2014-12-11: 0.5 mL via INTRAMUSCULAR
  Filled 2014-12-10: qty 0.5

## 2014-12-10 NOTE — Progress Notes (Signed)
Pt complaining of pain in both calves of an 8. Not warm to touch but pt complaining of tenderness. When nurse pushes in on foot pt states that it hurts worse on both sides. MD made aware, will monitor.

## 2014-12-10 NOTE — Progress Notes (Signed)
  Echocardiogram 2D Echocardiogram has been performed.  Kristin [name] 12/10/2014, 10:42 AM

## 2014-12-10 NOTE — Progress Notes (Signed)
Family Medicine Teaching Service Daily Progress Note Intern Pager: [phone]  Patient name: Kristin [name] Medical record number: [id number] Date of birth: Dec 03, 1955 Age: 59 y.o. Gender: female  Primary Care Provider: Tommi Rumps, MD Consultants: ENT Code Status: Full code  Pt Overview and Major Events to Date:  6/4: I&D and tonsilectomy 6/5: Bipap after surgery. Difficulty with wean. Required Lasix 20mg , Ativan 0.5mg  for anxiety with improvement > NRB.  6/6: Multifocal PNA: switched from clinda to Zosyn and Vanc  Assessment and Plan: Kristin [name] is a 59 y.o. female presenting with difficulty swallowing . 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 HR 117-126 in ED. CT neck with evidence of R palatine abscess 2.2x1.4cm and surrounding soft tissue edema. POD #4 from bilateral tonsillectomy.  - ENT recommends: continuing clindamycin, clears until discharge,  Prednisolone with taper (Dr. Simeon Craft states he called in clinda and prednisolone taper). He would also like her to have low dose pain medication (hydrocodone-APAP and ODT zofran on DC. Pt does have prolonged Qtc, if not resolved prior to dc could consider phenergan in place of zofran.  - Continue Hydrocodone -APAP solution - Promethazine PRN Nausea - Continue on clears, will be discharged with s/p tonsillectomy diet - WBC 19.1>17.1>14.0>12.9>15.1, Hgb 10.9>11.8>10.1   Respiratory failure: Post surgery the patient required BiPAP with no h/o O2 requirement prior.  BNP slightly elevated to 119.7. CXR prior to surgery revealed atelectasis on the R. Repeat s/p surgery revealed diffuse interstitial infiltrate. Given concerns for PE and need to further characterize infiltrate, CTA was obtained which looked c/w multifocal PNA without evidence of PE.  Troponins have slowly increased: 0.04 >0.90  >0.03. Lactic acid was normal and procalcitonin was 0.35  Could be ARDS given acute drop an h/o recent surgery.  Currently on a NRB satting (92-96%) - Blood cultures obtained and pending  - Continue to wean as tolerated - Switched from [place] to Vanc/Zosyn (D1/7).  - Would consider adding coverage for atypicals.  +/- prednisone - Will attempt to place on ventimask w/humidification today. - EKG this AM with no concerns for ACS - CCM recs: Continue supplemental O2, continue abx, chest PT, incentive spiro, mucinex, BiPAP PRN - consult cardiology: Continue trx for pna, Echo, Metoprolol 25mg  BID, ASA 81, repeat CXR, reschedule Myoview at discharge. - echo ordered  Tachycardia: has a h/o this from reviewing old notes, however could be secondary to infection. Patient was scheduled to have echo and myoview done this upcoming week with Dr Caryl Comes. EKG with sinus tach and prolonged QTc 539. Infectious vs respiratory distress vs baseline (was supposed to have work up ) Metoprolol 25 mg BID at home. HR 109-121 overnight. - Myoview needs to be rescheduled at discharge - continue to monitor  - cards recs as above  Hypokalemia: likely 2/2 decreased PO. K 2.9 in ED. On HCTZ at home.  - hold HCTZ for now. - K decreased from 3.7 >3.3>3.8  - Monitor and replete PRN  HTN: normotensive currently - Metoprolol 25mg  BID - Hold HCTZ pending BP's on home metoprolol  GERD: Prilosec at home -Protonix while hospitalized  Depression:  -Home Amitriptyline once able to PO  Renal sclerosis: Cr at baseline on admission (0.91-1.06). Only has one fucntioning kidney. CT neck was performed with contrast. - Cr stable today (0.74) - Will continue to monitor   FEN/GI: Clears while hospitalized, tonsillectomy diet on  D/C, PPI Prophylaxis:  SQ heparin  Disposition: continue in SDU for close monitoring  Disposition: Home pending clinical improvement with ENT f/u 3-4 weeks  Subjective:  Patient reports that she is  doing ok.  She reports that she is tolerating PO well.  She endorses SOB.  Denies CP, abdominal pain.  Reports that throat continues to improve daily.  Objective: Temp:  [98.4 F (36.9 C)-99.8 F (37.7 C)] 98.5 F (36.9 C) (06/08 0400) Pulse Rate:  [109-121] 110 (06/07 2324) Resp:  [23-33] 32 (06/07 2324) BP: (89-133)/(43-82) 111/68 mmHg (06/07 2000) SpO2:  [89 %-95 %] 90 % (06/07 2324) Physical Exam: General: Sitting up in bed alert in NAD eating HEENT: Scleral anicteric. MMM, Uvula midline. Tonsils absent without bleeding.  Neck: Mild TTP, +submandibular LAD Cardiovascular: tachycardic, rhythm regular, no murmurs, rubs, gallops. No pitting edema. 2+ DP pulses Respiratory: No increased WOB. Coarse crackles noted at the bases bilaterally. No wheezing/rhonchi noted. On 5L [place] satting 91%  Abdomen: +BS, obese, NT/ND Ext: no erythema or edema, no evidence of DVT Skin: no rashes noted  Neuro: follows commands, no focal deficits  Laboratory:  Recent Labs Lab 12/08/14 0700 12/09/14 0043 12/10/14 0320  WBC 14.0* 12.9* 15.1*  HGB 10.9* 11.8* 10.1*  HCT 33.8* 35.5* 31.3*  PLT 263 205 230    Recent Labs Lab 12/08/14 0700 12/09/14 0133 12/10/14 0320  NA 139 139 136  K 3.7 3.3* 3.8  CL 100* 101 98*  CO2 30 30 29   BUN 15 12 9   CREATININE 0.88 0.83 0.74  CALCIUM 8.8* 8.5* 8.6*  GLUCOSE 188* 139* 142*    Imaging/Diagnostic Tests: Dg Neck Soft Tissue 6/4 Abnormal prevertebral soft tissue prominence at C2-3, new since the prior exams. The possibility of prevertebral abscess should be considered. Electronically Signed By: Lorriane Shire M.D. On: 12/06/2014 15:19   6/4 Dg Chest 2 View  IMPRESSION: Subsegmental atelectasis RIGHT middle lobe.   Ct Soft Tissue Neck W Contrast 12/06/2014 1. Evolving 2.2 x 1.4 cm abscess at the deep portion of the right palatine tonsil, with associated diffuse soft tissue edema and right-sided effacement of the upper hypopharynx. Soft tissue  edema extends inferiorly to the level of the valleculae and piriform sinuses, with effacement of the left piriform sinus. Soft tissue edema also extends superiorly along the right side of the oropharynx. 2. Prevertebral soft tissue edema extends to the inferior hypopharynx, with minimal prevertebral soft tissue thickening. Diffuse asymmetric soft tissue inflammation at the right parapharyngeal fat planes. 3. Mild asymmetric prominence of the right submandibular gland, with surrounding soft tissue inflammation tracking about the right side of the neck. Minimal soft tissue stranding extends to the inferior floor of the mouth, without significant Ludwig's angina at this time. 4. Mildly enlarged bilateral cervical nodes seen, measuring up to 1.3 cm in short axis. These are somewhat more prominent on the right. 5. Mild degenerative change noted along the cervical spine.   6/6 Dg Chest 2 View Diffuse interstitial infiltrate bilaterally. No sizable effusion is noted.  6/6 CTA chest  1. No evidence for acute pulmonary embolus. 2. Bilateral upper and lower lobe airspace opacities compatible with multifocal pneumonia.  Janora Norlander, DO 12/10/2014, 6:45 AM PGY-1, [place] Intern pager: [phone], text pages welcome

## 2014-12-10 NOTE — Progress Notes (Signed)
Patient c/o of severe epigastric burning pain at 2035. She stated she thought it was heartburn. EKG done and it showed ST, otherwise normal EKG. Hycet liquid given for pain and pain was completely relieved in 40 minutes. No other complaints.

## 2014-12-10 NOTE — Progress Notes (Signed)
Spoke with Elink MD, possibility of a DVT. Ultrasound ordered by MD. Will leave SCDs off. Pain medication given to pt.

## 2014-12-10 NOTE — Progress Notes (Addendum)
[place] [facility] Progress Note Patient Name: Kristin [name] DOB: [birth year] MRN: [id number]   Date of Service  12/10/2014  HPI/Events of Note  Patient c/o pain in both calves. Has intact DP pulses bilaterally. Calves not swollen per beside nurse assessment. However, daughter states that the legs look more swollen to her. DDx: DVT vs leg cramps.   eICU Interventions  Will order bilateral LE duplex US to r/o DVT.     Intervention Category Intermediate Interventions: Other:  Lysle Dingwall 12/10/2014, 5:40 PM

## 2014-12-10 NOTE — Progress Notes (Signed)
Did not perform chest PT at 4 am rounds. Patient did not cough anything up with last vest therapy. Pt resting with all vitals within normal limits. RT will give chest PT at next scheduled time.

## 2014-12-10 NOTE — Procedures (Signed)
Pt refuses the BIPAP.  RT will continue to monitor pt and will placed if needed or requested.  Pt also request not to be disturbed during the night for CPT.  Pt wants to sleep.

## 2014-12-10 NOTE — Progress Notes (Signed)
NAD on NRB mask with SpO2 97-100% No new complaints Indicates that she feels a little less dyspneic  Filed Vitals:   12/10/14 0000 12/10/14 0400 12/10/14 0909 12/10/14 1244  BP:   135/67 121/64  Pulse:   119 100  Temp: 98.7 F (37.1 C) 98.5 F (36.9 C) 98.8 F (37.1 C) 97.3 F (36.3 C)  TempSrc: Axillary Axillary Oral Axillary  Resp:   29 19  Height:      Weight:      SpO2:   100% 97%   NAD JVP cannot be visualized Diffuse coarse rhonchi RRR s M Obese, soft, NT Ext warm without edema  BMET    Component Value Date/Time   NA 136 12/10/2014 0320   K 3.8 12/10/2014 0320   CL 98* 12/10/2014 0320   CO2 29 12/10/2014 0320   GLUCOSE 142* 12/10/2014 0320   BUN 9 12/10/2014 0320   CREATININE 0.74 12/10/2014 0320   CREATININE 0.91 11/12/2014 1618   CALCIUM 8.6* 12/10/2014 0320   GFRNONAA >60 12/10/2014 0320   GFRAA >60 12/10/2014 0320    CBC    Component Value Date/Time   WBC 15.1* 12/10/2014 0320   RBC 3.47* 12/10/2014 0320   HGB 10.1* 12/10/2014 0320   HCT 31.3* 12/10/2014 0320   PLT 230 12/10/2014 0320   MCV 90.2 12/10/2014 0320   MCH 29.1 12/10/2014 0320   MCHC 32.3 12/10/2014 0320   RDW 13.3 12/10/2014 0320   LYMPHSABS 1.2 12/06/2014 1515   MONOABS 1.5* 12/06/2014 1515   EOSABS 0.1 12/06/2014 1515   BASOSABS 0.0 12/06/2014 1515   PCT 0.31  CXR: ARDS pattern  IMPRESSION: Tonsillar abscess - s/p drainage Acute hypoxic resp failure ALI/ARDS - possible aspiration PNA No evidence of resistant GPC  PLAN: Cont supportive care Wean O2 as tolerated DC vanc Cont pip-tazo Cont to monitor in SDU  Merton Border, MD ; [facility] [phone].  After 5:30 PM or weekends, call [phone]

## 2014-12-10 NOTE — Progress Notes (Signed)
Subjective:  Wearing FM O2. A little better. No CP  Objective:  Vital Signs in the last 24 hours: Temp:  [98.4 F (36.9 C)-99.8 F (37.7 C)] 98.8 F (37.1 C) (06/08 0909) Pulse Rate:  [109-121] 119 (06/08 0909) Resp:  [24-32] 29 (06/08 0909) BP: (89-135)/(54-82) 135/67 mmHg (06/08 0909) SpO2:  [90 %-100 %] 100 % (06/08 0909)  Intake/Output from previous day: 06/07 0701 - 06/08 0700 In: 203 [I.V.:3; IV Piggyback:200] Out: 1300 [Urine:1300]   Physical Exam: General: Well developed, well nourished, in no acute distress. Head:  Normocephalic and atraumatic. FM O2 Lungs: Mild scattered rhonchi Heart: Tachy RR S1 and S2.  No murmur, rubs or gallops.  Abdomen: soft, non-tender, positive bowel sounds. Obese Extremities: No clubbing or cyanosis. No edema. Neurologic: Alert and oriented x 3.    Lab Results:  Recent Labs  12/09/14 0043 12/10/14 0320  WBC 12.9* 15.1*  HGB 11.8* 10.1*  PLT 205 230    Recent Labs  12/09/14 0133 12/10/14 0320  NA 139 136  K 3.3* 3.8  CL 101 98*  CO2 30 29  GLUCOSE 139* 142*  BUN 12 9  CREATININE 0.83 0.74    Recent Labs  12/09/14 0133 12/09/14 0740  TROPONINI 0.06* <0.03    Imaging: Dg Chest 2 View  12/08/2014   CLINICAL DATA:  Shortness of breath for 1 day  EXAM: CHEST - 2 VIEW  COMPARISON:  12/06/2014  FINDINGS: The cardiac shadow is stable. The lungs are well aerated bilaterally. Increasing peribronchial changes are noted bilaterally. No focal confluent infiltrate is seen. This may represent a component of CHF although an infectious etiology cannot be excluded.  IMPRESSION: Diffuse interstitial infiltrate bilaterally. No sizable effusion is noted.   Electronically Signed   By: Inez Catalina M.D.   On: 12/08/2014 15:12   Ct Angio Chest Pe W/cm &/or Wo Cm  12/08/2014   CLINICAL DATA:  Recent tonsillectomy. Hypoxia. Evaluate for infiltrate and PE  EXAM: CT ANGIOGRAPHY CHEST WITH CONTRAST  TECHNIQUE: Multidetector CT imaging of  the chest was performed using the standard protocol during bolus administration of intravenous contrast. Multiplanar CT image reconstructions and MIPs were obtained to evaluate the vascular anatomy.  CONTRAST:  185mL OMNIPAQUE IOHEXOL 350 MG/ML SOLN  COMPARISON:  None  FINDINGS: Mediastinum: Normal heart size. No pericardial effusion. The trachea is patent and appears midline. Normal appearance of the esophagus. No mediastinal or hilar adenopathy. The main pulmonary artery appears normal and patent.  Lungs/Pleura: No pleural effusion identified. Bilateral airspace opacities are identified involving in the upper and lower lobes. No atelectasis.  Upper Abdomen: There is asymmetric atrophy of the right kidney. Visualized portions of the left kidney are unremarkable. The visualized portions of the spleen and liver are normal.  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  Review of the MIP images confirms the above findings.  IMPRESSION: 1. No evidence for acute pulmonary embolus. 2. Bilateral upper and lower lobe airspace opacities compatible with multifocal pneumonia.   Electronically Signed   By: Kerby Moors M.D.   On: 12/08/2014 18:35   Dg Chest Port 1 View  12/10/2014   CLINICAL DATA:  Hypoxia.  EXAM: PORTABLE CHEST - 1 VIEW  COMPARISON:  12/08/2014  FINDINGS: Airspace opacities have worsened in the central and basilar regions, now with more confluent consolidation. There is no large effusion. There is no pneumothorax.  IMPRESSION: Worsened airspace opacities in the central and basilar regions.  Electronically Signed   By: Andreas Newport M.D.   On: 12/10/2014 07:00   Personally viewed.   Telemetry: Sinus tachy Personally viewed.   EKG:  No ST changes  Cardiac Studies:  ECHO - normal EF, mild pHTN 64mmHG  Scheduled Meds: . amitriptyline  50 mg Oral QHS  . antiseptic oral rinse  7 mL Mouth Rinse BID  . aspirin  81 mg Oral Daily  . heparin  subcutaneous  5,000 Units Subcutaneous 3 times per day  . metoprolol tartrate  25 mg Oral BID  . pantoprazole (PROTONIX) IV  40 mg Intravenous Q24H  . piperacillin-tazobactam (ZOSYN)  IV  3.375 g Intravenous 3 times per day  . potassium chloride  40 mEq Oral BID  . sodium chloride  3 mL Intravenous Q12H  . vancomycin  750 mg Intravenous Q12H   Continuous Infusions:  PRN Meds:.acetaminophen (TYLENOL) oral liquid 160 mg/5 mL, albuterol, HYDROcodone-acetaminophen, magic mouthwash w/lidocaine, polyethylene glycol, promethazine  Assessment/Plan:  Principal Problem:   Tonsillar abscess Active Problems:   Major depression, recurrent   HYPERTENSION, BENIGN SYSTEMIC   GERD, SEVERE   Tonsil, abscess   Pharyngeal swelling   Hypoxia   Infiltrate noted on imaging study   Acute respiratory failure with hypoxia  -Trop elevated minimal, now normal, likely Demand ischemia in the setting of Resp illness.  -ECHO with normal EF -Had NUC stress sched as outpatient. Would proceed this as outpatient once improved from respiratory perspective. -HTN per primary team.  -Multifocal PNA per pulm/primary team -Continue low dose metoprolol and asa 81.  Will sign off. Please call with questions.  Has follow up with Dr. Caryl Comes.    Stefon Ramthun, [place] 12/10/2014, 11:32 AM

## 2014-12-11 ENCOUNTER — Encounter (HOSPITAL_COMMUNITY): Payer: No Typology Code available for payment source

## 2014-12-11 ENCOUNTER — Ambulatory Visit (HOSPITAL_COMMUNITY): Payer: Self-pay

## 2014-12-11 ENCOUNTER — Other Ambulatory Visit (HOSPITAL_COMMUNITY): Payer: No Typology Code available for payment source

## 2014-12-11 DIAGNOSIS — M79609 Pain in unspecified limb: Secondary | ICD-10-CM

## 2014-12-11 DIAGNOSIS — J9601 Acute respiratory failure with hypoxia: Secondary | ICD-10-CM

## 2014-12-11 LAB — BASIC METABOLIC PANEL
Anion gap: 11 (ref 5–15)
BUN: 8 mg/dL (ref 6–20)
CO2: 24 mmol/L (ref 22–32)
Calcium: 8.6 mg/dL — ABNORMAL LOW (ref 8.9–10.3)
Chloride: 100 mmol/L — ABNORMAL LOW (ref 101–111)
Creatinine, Ser: 0.88 mg/dL (ref 0.44–1.00)
Glucose, Bld: 151 mg/dL — ABNORMAL HIGH (ref 65–99)
Potassium: 4.5 mmol/L (ref 3.5–5.1)
SODIUM: 135 mmol/L (ref 135–145)

## 2014-12-11 LAB — CBC
HEMATOCRIT: 30.8 % — AB (ref 36.0–46.0)
HEMOGLOBIN: 10 g/dL — AB (ref 12.0–15.0)
MCH: 29.7 pg (ref 26.0–34.0)
MCHC: 32.5 g/dL (ref 30.0–36.0)
MCV: 91.4 fL (ref 78.0–100.0)
PLATELETS: 241 10*3/uL (ref 150–400)
RBC: 3.37 MIL/uL — AB (ref 3.87–5.11)
RDW: 13.6 % (ref 11.5–15.5)
WBC: 15.2 10*3/uL — AB (ref 4.0–10.5)

## 2014-12-11 LAB — HIV ANTIBODY (ROUTINE TESTING W REFLEX): HIV SCREEN 4TH GENERATION: NONREACTIVE

## 2014-12-11 MED ORDER — PANTOPRAZOLE SODIUM 40 MG PO TBEC
40.0000 mg | DELAYED_RELEASE_TABLET | Freq: Every day | ORAL | Status: DC
  Administered 2014-12-11 – 2014-12-14 (×4): 40 mg via ORAL
  Filled 2014-12-11 (×4): qty 1

## 2014-12-11 NOTE — Procedures (Signed)
RT's clinical decision not to perform CPT at this time due to pt's earlier request to sleep. Pt is sleeping comfortably at this time.  RT will not disturb pt.

## 2014-12-11 NOTE — Progress Notes (Signed)
Family Medicine Teaching Service Daily Progress Note Intern Pager: [phone]  Patient name: Kristin [name] Medical record number: [id number] Date of birth: February 13, 1956 Age: 59 y.o. Gender: female  Primary Care Provider: Tommi Rumps, MD Consultants: ENT Code Status: Full code  Pt Overview and Major Events to Date:  6/4: I&D and tonsilectomy 6/5: Bipap after surgery. Difficulty with wean. Required Lasix 20mg , Ativan 0.5mg  for anxiety with improvement > NRB.  6/6: Multifocal PNA: switched from clinda to Zosyn and Vanc 6/8: Vanc d/c  Assessment and Plan: Kristin [name] is a 59 y.o. female presenting with difficulty swallowing . 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 HR 117-126 in ED. CT neck with evidence of R palatine abscess 2.2x1.4cm and surrounding soft tissue edema. POD #4 from bilateral tonsillectomy.  - ENT recommends: continuing clindamycin, clears until discharge,  Prednisolone with taper (Dr. Simeon Craft states he called in clinda and prednisolone taper). He would also like her to have low dose pain medication (hydrocodone-APAP and ODT zofran on DC. Pt does have prolonged Qtc, if not resolved prior to dc could consider phenergan in place of zofran.  - Continue Hydrocodone -APAP solution - Promethazine PRN Nausea - Continue on clears, will be discharged with s/p tonsillectomy diet - WBC 19.1>17.1>14.0>12.9>15.1>15.2, Hgb 10.9>11.8>10.1  - If WBC continues to rise, would consider broadening antibiotics  Respiratory failure: Post surgery the patient required BiPAP with no h/o O2 requirement prior.  BNP slightly elevated to 119.7. CXR prior to surgery revealed atelectasis on the R. Repeat s/p surgery revealed diffuse interstitial infiltrate. Given concerns for PE and need to further characterize infiltrate, CTA was obtained which looked c/w  multifocal PNA without evidence of PE.  Troponins consistent with demand ischemia due to illness: 0.04 >0.90 >0.03. Lactic acid was normal and procalcitonin was 0.35  Could be ARDS given acute drop an h/o recent surgery. CXR from 6/8 revealed worsened opacities. Currently on a venturi mask, 15L satting (85-96% with a desaturation down to 77% while sleeping) - Blood cultures obtained and NGTD - Continue to wean O2 as tolerated - Switched from [place] to [place], no Zosyn alone (D3/7).  - No coverage for atypicals or prednisone per PCCM - CCM recs: Continue supplemental O2 and wean as tolerated, D/C vanc, cont Zosyn, continue to monitor in SDU - consult cardiology: Cont Metoprolol 25mg  BID, ASA 81, reschedule Myoview at discharge. - normal EF, grade 1 DD  Tachycardia: has a h/o this from reviewing old notes, however could be secondary to infection. Patient was scheduled to have echo and myoview done this upcoming week with Dr Caryl Comes. EKG with sinus tach and prolonged QTc 539. Infectious vs respiratory distress vs baseline (was supposed to have work up ) Metoprolol 25 mg BID at home.  - Myoview needs to be rescheduled at discharge - continue to monitor  - cards recs as above  Hypokalemia: likely 2/2 decreased PO. K 2.9 in ED. On HCTZ at home.  - hold HCTZ for now. - continue to monitor K 3.7 >3.3>3.8 >4.5 - Monitor and replete PRN  HTN: normotensive currently - Metoprolol 25mg  BID - Hold HCTZ pending BP's on home metoprolol  GERD: Prilosec at home -Protonix while hospitalized  Depression:  -Home Amitriptyline   Renal sclerosis: Cr at baseline on admission (0.91-1.06). Only has one fucntioning kidney. CT neck was performed with contrast. - Cr stable today (0.88) -  Will continue to monitor   FEN/GI: Clears while hospitalized, tonsillectomy diet on D/C, PPI Prophylaxis:  SQ heparin  Disposition: continue in SDU for close monitoring  Disposition: Home pending clinical  improvement with ENT f/u 3-4 weeks  Subjective:  Patient states she feels her breathing as improved slightly. No pain. Tolerating a regular diet  Objective: Temp:  [97.3 F (36.3 C)-99.2 F (37.3 C)] 98.1 F (36.7 C) (06/08 2343) Pulse Rate:  [100-125] 101 (06/08 2343) Resp:  [18-29] 25 (06/08 2343) BP: (101-135)/(60-108) 101/60 mmHg (06/08 2343) SpO2:  [90 %-100 %] 93 % (06/08 2343) FiO2 (%):  [55 %] 55 % (06/08 2144) Physical Exam: General: Sleeping in bed in NAD HEENT: Scleral anicteric. MMM, Uvula midline. Tonsils absent without bleeding.  Neck: Mild TTP, +submandibular LAD Cardiovascular: tachycardic, rhythm regular, no murmurs, rubs, gallops. No pitting edema. 2+ DP pulses Respiratory: No increased WOB. Coarse crackles noted at the bases bilaterally. No wheezing/rhonchi noted on the venturi mask.  Abdomen: +BS, obese, NT/ND Ext: no erythema or edema, no evidence of DVT Skin: no rashes noted  Neuro: follows commands, no focal deficits  Laboratory:  Recent Labs Lab 12/08/14 0700 12/09/14 0043 12/10/14 0320  WBC 14.0* 12.9* 15.1*  HGB 10.9* 11.8* 10.1*  HCT 33.8* 35.5* 31.3*  PLT 263 205 230    Recent Labs Lab 12/08/14 0700 12/09/14 0133 12/10/14 0320  NA 139 139 136  K 3.7 3.3* 3.8  CL 100* 101 98*  CO2 30 30 29   BUN 15 12 9   CREATININE 0.88 0.83 0.74  CALCIUM 8.8* 8.5* 8.6*  GLUCOSE 188* 139* 142*  Trop: 0.04>0.06>0.03 HIV negative  BNP 110>59 Lactic acid 1.3>1.6 Procalcitonin 0.35  Imaging/Diagnostic Tests: Dg Neck Soft Tissue 6/4 Abnormal prevertebral soft tissue prominence at C2-3, new since the prior exams. The possibility of prevertebral abscess should be considered. Electronically Signed By: Lorriane Shire M.D. On: 12/06/2014 15:19   6/4 Dg Chest 2 View  IMPRESSION: Subsegmental atelectasis RIGHT middle lobe.   Ct Soft Tissue Neck W Contrast 12/06/2014 1. Evolving 2.2 x 1.4 cm abscess at the deep portion of the right palatine  tonsil, with associated diffuse soft tissue edema and right-sided effacement of the upper hypopharynx. Soft tissue edema extends inferiorly to the level of the valleculae and piriform sinuses, with effacement of the left piriform sinus. Soft tissue edema also extends superiorly along the right side of the oropharynx. 2. Prevertebral soft tissue edema extends to the inferior hypopharynx, with minimal prevertebral soft tissue thickening. Diffuse asymmetric soft tissue inflammation at the right parapharyngeal fat planes. 3. Mild asymmetric prominence of the right submandibular gland, with surrounding soft tissue inflammation tracking about the right side of the neck. Minimal soft tissue stranding extends to the inferior floor of the mouth, without significant Ludwig's angina at this time. 4. Mildly enlarged bilateral cervical nodes seen, measuring up to 1.3 cm in short axis. These are somewhat more prominent on the right. 5. Mild degenerative change noted along the cervical spine.   6/6 Dg Chest 2 View Diffuse interstitial infiltrate bilaterally. No sizable effusion is noted.  6/6 CTA chest  1. No evidence for acute pulmonary embolus. 2. Bilateral upper and lower lobe airspace opacities compatible with multifocal pneumonia.  6/8 Dg Chest  Worsened airspace opacities in the central and basilar regions.  Echocardiogram: EF 55-60%, no RMWA, grade 1 diastolic dysfunction  Archie Patten, MD 12/11/2014, 2:31 AM PGY-1, [place] Intern pager: [phone], text pages welcome

## 2014-12-11 NOTE — Procedures (Signed)
Pt refuses CPT and wants to sleep.  Pt has also requested to skip the 04:00 CPT as well. Rt will comply.

## 2014-12-11 NOTE — Procedures (Signed)
Pt sleeping at this time resting comfortably.  Pt request and clinical decision to hold CPT.

## 2014-12-11 NOTE — Progress Notes (Signed)
ANTIBIOTIC CONSULT NOTE - Follow-up  Pharmacy Consult for Zosyn Indication: pneumonia  Allergies  Allergen Reactions  . Fluconazole Swelling    REACTION: Face swelling  . Robitussin (Alcohol Free) [Guaifenesin] Palpitations    Chest pain    Patient Measurements: Height: 5\' 2"  (157.5 cm) Weight: 245 lb 2.4 oz (111.2 kg) IBW/kg (Calculated) : 50.1   Vital Signs: Temp: 99 F (37.2 C) (06/09 0438) Temp Source: Oral (06/09 0438) BP: 116/51 mmHg (06/09 0734) Pulse Rate: 109 (06/09 0741) Intake/Output from previous day: 06/08 0701 - 06/09 0700 In: 103 [I.V.:3; IV Piggyback:100] Out: 1351 [Urine:1350; Stool:1] Intake/Output from this shift: Total I/O In: -  Out: 250 [Urine:250]  Labs:  Recent Labs  12/09/14 0043 12/09/14 0133 12/10/14 0320 12/11/14 0306  WBC 12.9*  --  15.1* 15.2*  HGB 11.8*  --  10.1* 10.0*  PLT 205  --  230 241  CREATININE  --  0.83 0.74 0.88   Estimated Creatinine Clearance: 83 mL/min (by C-G formula based on Cr of 0.88). No results for input(s): VANCOTROUGH, VANCOPEAK, VANCORANDOM, GENTTROUGH, GENTPEAK, GENTRANDOM, TOBRATROUGH, TOBRAPEAK, TOBRARND, AMIKACINPEAK, AMIKACINTROU, AMIKACIN in the last 72 hours.   Microbiology: Recent Results (from the past 720 hour(s))  Urine culture     Status: None   Collection Time: 12/06/14 12:57 PM  Result Value Ref Range Status   Specimen Description URINE, CLEAN CATCH  Final   Special Requests NONE  Final   Colony Count   Final    20,OOO COLONIES/ML Performed at [facility]    Culture   Final    Multiple bacterial morphotypes present, none predominant. Suggest appropriate recollection if clinically indicated. Performed at [facility]    Report Status 12/07/2014 FINAL  Final  Culture, Group A Strep     Status: Abnormal   Collection Time: 12/06/14  1:10 PM  Result Value Ref Range Status   Strep A Culture Positive (A)  Final    Comment: (NOTE) Penicillin and ampicillin are drugs of  choice for treatment of beta-hemolytic streptococcal infections. Susceptibility testing of penicillins and other beta-lactam agents approved by the FDA for treatment of beta-hemolytic streptococcal infections need not be performed routinely because nonsusceptible isolates are extremely rare in any beta-hemolytic streptococcus and have not been reported for Streptococcus pyogenes (group A). (CLSI 2011) Performed At: [facility] [place], [place] [id number] Lindon Romp MD [id number]   MRSA PCR Screening     Status: None   Collection Time: 12/07/14  3:05 AM  Result Value Ref Range Status   MRSA by PCR NEGATIVE NEGATIVE Final    Comment:        The GeneXpert MRSA Assay (FDA approved for NASAL specimens only), is one component of a comprehensive MRSA colonization surveillance program. It is not intended to diagnose MRSA infection nor to guide or monitor treatment for MRSA infections.   Culture, blood (routine x 2)     Status: None (Preliminary result)   Collection Time: 12/08/14  8:15 PM  Result Value Ref Range Status   Specimen Description BLOOD LEFT ANTECUBITAL  Final   Special Requests BOTTLES DRAWN AEROBIC AND ANAEROBIC 5CC EA  Final   Culture   Final           BLOOD CULTURE RECEIVED NO GROWTH TO DATE CULTURE WILL BE HELD FOR 5 DAYS BEFORE ISSUING A FINAL NEGATIVE REPORT Performed at [facility]    Report Status PENDING  Incomplete  Culture, blood (routine x 2)  Status: None (Preliminary result)   Collection Time: 12/08/14  8:30 PM  Result Value Ref Range Status   Specimen Description BLOOD RIGHT ANTECUBITAL  Final   Special Requests BOTTLES DRAWN AEROBIC AND ANAEROBIC 5CC EA  Final   Culture   Final           BLOOD CULTURE RECEIVED NO GROWTH TO DATE CULTURE WILL BE HELD FOR 5 DAYS BEFORE ISSUING A FINAL NEGATIVE REPORT Performed at [facility]    Report Status PENDING  Incomplete   Assessment: 40 YOF admitted 12/06/2014  with a sore throat and was found to have a peritonsillar vs intratonsillar abscess, she is now s/p bilateral tonsillectomy. She had SOB after surgery so she was started on broad-spectrum vancomycin + zosyn for possible pneumonia. Pt if afebrile and WBC is elevated at 15.2. Vancomycin was stopped yesterday. Renal function has been stable and zosyn dose remains appropriate. She is group A strep positive culture otherwise cultures have been negative to date.   Vanc 6/6>>6/8 Zosyn 6/6>>  Goal of Therapy:  Eradication of infection  Plan:  - Continue zosyn 3.375gm IV Q8H (4 hr inf) - Consider de-escalating when possible  *Pharmacy will sign-off as not further dose adjustments anticipated. Thank you for the consult!  Salome Arnt, PharmD, BCPS Pager # [phone] 12/11/2014 7:56 AM

## 2014-12-11 NOTE — Progress Notes (Signed)
NAD on [place] O2 with SpO2 92% No new complaints, feels better  Filed Vitals:   12/11/14 0734 12/11/14 0741 12/11/14 0941 12/11/14 1124  BP: 116/51  114/70   Pulse: 108 109 110 97  Temp:      TempSrc:      Resp: 22 21  23   Height:      Weight:      SpO2:  92%  97%   NAD JVP cannot be visualized Improved rhonchi RRR s M Obese, soft, NT Ext warm without edema  BMET    Component Value Date/Time   NA 135 12/11/2014 0306   K 4.5 12/11/2014 0306   CL 100* 12/11/2014 0306   CO2 24 12/11/2014 0306   GLUCOSE 151* 12/11/2014 0306   BUN 8 12/11/2014 0306   CREATININE 0.88 12/11/2014 0306   CREATININE 0.91 11/12/2014 1618   CALCIUM 8.6* 12/11/2014 0306   GFRNONAA >60 12/11/2014 0306   GFRAA >60 12/11/2014 0306    CBC    Component Value Date/Time   WBC 15.2* 12/11/2014 0306   RBC 3.37* 12/11/2014 0306   HGB 10.0* 12/11/2014 0306   HCT 30.8* 12/11/2014 0306   PLT 241 12/11/2014 0306   MCV 91.4 12/11/2014 0306   MCH 29.7 12/11/2014 0306   MCHC 32.5 12/11/2014 0306   RDW 13.6 12/11/2014 0306   LYMPHSABS 1.2 12/06/2014 1515   MONOABS 1.5* 12/06/2014 1515   EOSABS 0.1 12/06/2014 1515   BASOSABS 0.0 12/06/2014 1515    CXR: NNF  IMPRESSION: Tonsillar abscess - s/p drainage Acute hypoxic resp failure ALI/ARDS - possible aspiration PNA No evidence of resistant GPC  PLAN: Cont supportive care Wean O2 as tolerated Cont pip-tazo - can probably change to amox-clav in next 24 hrs Appears ready for med-surg Recheck CXR in AM 6/10  Merton Border, MD ; [facility] service Mobile [phone].  After 5:30 PM or weekends, call [phone]

## 2014-12-11 NOTE — Progress Notes (Signed)
*  Preliminary Results* Bilateral lower extremity venous duplex completed. Bilateral lower extremities are negative for deep vein thrombosis. There is no evidence of Baker's cyst bilaterally.  12/11/2014  Maudry Mayhew, RVT, RDCS, RDMS

## 2014-12-11 NOTE — Progress Notes (Signed)
Patient wanting to hold off on CPT at this time.  Also placed patient on 6L nasal cannula.  Patient states that it is more comfortable.  Sats currently 92%.  Will continue to monitor.

## 2014-12-12 ENCOUNTER — Inpatient Hospital Stay (HOSPITAL_COMMUNITY): Payer: No Typology Code available for payment source

## 2014-12-12 ENCOUNTER — Ambulatory Visit (HOSPITAL_COMMUNITY): Payer: No Typology Code available for payment source

## 2014-12-12 LAB — BASIC METABOLIC PANEL
ANION GAP: 10 (ref 5–15)
BUN: 7 mg/dL (ref 6–20)
CO2: 27 mmol/L (ref 22–32)
Calcium: 8.9 mg/dL (ref 8.9–10.3)
Chloride: 98 mmol/L — ABNORMAL LOW (ref 101–111)
Creatinine, Ser: 0.82 mg/dL (ref 0.44–1.00)
GFR calc non Af Amer: >60 mL/min (ref >60)
Glucose, Bld: 115 mg/dL — ABNORMAL HIGH (ref 65–99)
Potassium: 4.8 mmol/L (ref 3.5–5.1)
Sodium: 135 mmol/L (ref 135–145)

## 2014-12-12 LAB — CBC
HCT: 29.8 % — ABNORMAL LOW (ref 36.0–46.0)
Hemoglobin: 9.7 g/dL — ABNORMAL LOW (ref 12.0–15.0)
MCH: 29.4 pg (ref 26.0–34.0)
MCHC: 32.6 g/dL (ref 30.0–36.0)
MCV: 90.3 fL (ref 78.0–100.0)
Platelets: 310 10*3/uL (ref 150–400)
RBC: 3.3 MIL/uL — ABNORMAL LOW (ref 3.87–5.11)
RDW: 13.6 % (ref 11.5–15.5)
WBC: 12.8 10*3/uL — AB (ref 4.0–10.5)

## 2014-12-12 LAB — EXPECTORATED SPUTUM ASSESSMENT W GRAM STAIN, RFLX TO RESP C

## 2014-12-12 LAB — PROCALCITONIN: Procalcitonin: 0.15 ng/mL

## 2014-12-12 MED ORDER — WHITE PETROLATUM GEL
Status: AC
Start: 2014-12-12 — End: 2014-12-12
  Administered 2014-12-12: 15:00:00
  Filled 2014-12-12: qty 1

## 2014-12-12 MED ORDER — ACETAMINOPHEN 160 MG/5ML PO SOLN: 650.0000 mg | ORAL | Status: DC | PRN

## 2014-12-12 MED ORDER — AMOXICILLIN-POT CLAVULANATE 875-125 MG PO TABS
1.0000 | ORAL_TABLET | Freq: Two times a day (BID) | ORAL | Status: DC
  Administered 2014-12-12 – 2014-12-14 (×5): 1 via ORAL
  Filled 2014-12-12 (×6): qty 1

## 2014-12-12 NOTE — Discharge Summary (Signed)
[facility] Discharge Summary  Patient name: Kristin [name] Medical record number: [id number] Date of birth: 1955/10/17 Age: 59 y.o. Gender: female Date of Admission: 12/06/2014  Date of Discharge: 12/14/14 Admitting Physician: Blane Ohara McDiarmid, MD  Primary Care Provider: Tommi Rumps, MD Consultants: ENT  Indication for Hospitalization: Tonsillar abscess, acute respiratory failure  Discharge Diagnoses/Problem List:  Acute respiratory failure  Right Peritonsillar abscess s/p tonsillectomy  Sinus tachycardia Hypokalemia HTN GERD Depression  Renal sclerosis   Disposition: Home   Discharge Condition: Stable, improved  Brief Hospital Course:  Ms. Nicolls presented with difficulty swallowing 2 days prior to admission, fever to 100F, fatigue, and decreased PO. At outside ED, she was noted to have an erythematous, swollen soft palate with exudate concerning for abscess therefore was transferred to [facility] ED. There, ENT was consulted who attempted bedside I&D.  She was then take to the OR for bilateral tonsillectomy. She was started on clindamycin.  Post-operatively, the patient had a new O2 requirement requiring BiPAP. She was given lasix 20mg  IV with some initial improvement, however required placement back on BiPAP.  CXR on 6/6 revealed diffuse interstitial infiltrate (CXR from 6/4 only revealed subsegmental atelectasis of RML). Clindamycin was switched to vancomycin and Zosyn. She was also noted to be tachycardic throughout her stay (100s-130s). Given concerns for PE and to further characterize the infiltrate, a CTA chest was done which was negative for PE but revealed bilateral upper and lower lobe airspace opacities compatible with multifocal pneumonia. BNP was slightly elevated at 110 and echocardiogram revealed an EF 55-60% and grade 1 diastolic dysfunction.  PCCM was consulted due to concerns for stagnant improvement and recommended continuing to wean  O2 with current antibiotic regimen. She was eventually able to be transitioned from a non-rebreather, then to [place].  On 6/8, vancomycin was continued per PCCM and then transitioned to Augmentin on 6/10. She was satting well on RA and was tolerating a soft/cool diet s/p her tonsillectomy. It is unsure whether this respiratory failure was secondary to HCAP vs ARDS, however she was discharged with Augmentin to complete a 10 day course (was called in due to problems with prescribing electronically therefore not noted on med rec below).   Issues for Follow Up:  1. Patient missed her appt to have a myoview with cardiology to work up her stable sinus tachycardia, follow up  2. Consider repeat CXR in approximately 6 weeks to ensure resolution.  3. Patient's HCTZ was held due to soft BPs while inpatient (most likely 2/2 poor PO intake). Follow up BPs and consider re-starting.  Significant Procedures:  6/4: I&D of peritonsillar abscess  6/4: bilateral tonsillectomy  Significant Labs and Imaging:   Recent Labs Lab 12/12/14 0225 12/13/14 0440 12/14/14 0418  WBC 12.8* 12.3* 12.1*  HGB 9.7* 9.9* 9.7*  HCT 29.8* 31.1* 30.3*  PLT 310 333 389    Recent Labs Lab 12/10/14 0320 12/11/14 0306 12/12/14 0225 12/13/14 0440 12/14/14 0418  NA 136 135 135 135 134*  K 3.8 4.5 4.8 4.2 4.1  CL 98* 100* 98* 97* 97*  CO2 29 24 27 27 28   GLUCOSE 142* 151* 115* 104* 111*  BUN 9 8 7 6 6   CREATININE 0.74 0.88 0.82 0.78 0.86  CALCIUM 8.6* 8.6* 8.9 9.0 9.0  Trop: 0.04>0.06>0.03 HIV negative  BNP 110>59 Lactic acid 1.3>1.6 Procalcitonin 0.35 >0.31>0.15 Blood cultures: Negative   Dg Neck Soft Tissue 6/4 Abnormal prevertebral soft tissue prominence at C2-3, new since the  prior exams. The possibility of prevertebral abscess should be considered. Electronically Signed By: Lorriane Shire M.D. On: 12/06/2014 15:19   6/4 Dg Chest 2 View IMPRESSION: Subsegmental atelectasis RIGHT middle lobe.   Ct Soft  Tissue Neck W Contrast 12/06/2014 1. Evolving 2.2 x 1.4 cm abscess at the deep portion of the right palatine tonsil, with associated diffuse soft tissue edema and right-sided effacement of the upper hypopharynx. Soft tissue edema extends inferiorly to the level of the valleculae and piriform sinuses, with effacement of the left piriform sinus. Soft tissue edema also extends superiorly along the right side of the oropharynx. 2. Prevertebral soft tissue edema extends to the inferior hypopharynx, with minimal prevertebral soft tissue thickening. Diffuse asymmetric soft tissue inflammation at the right parapharyngeal fat planes. 3. Mild asymmetric prominence of the right submandibular gland, with surrounding soft tissue inflammation tracking about the right side of the neck. Minimal soft tissue stranding extends to the inferior floor of the mouth, without significant Ludwig's angina at this time. 4. Mildly enlarged bilateral cervical nodes seen, measuring up to 1.3 cm in short axis. These are somewhat more prominent on the right. 5. Mild degenerative change noted along the cervical spine.   6/6 Dg Chest 2 View Diffuse interstitial infiltrate bilaterally. No sizable effusion is noted.  6/6 CTA chest  1. No evidence for acute pulmonary embolus. 2. Bilateral upper and lower lobe airspace opacities compatible with multifocal pneumonia.  6/8 Dg Chest  Worsened airspace opacities in the central and basilar regions.  Echocardiogram: EF 55-60%, no RMWA, grade 1 diastolic dysfunction  6/50 Dg Chest  persistent bilateral airspace opacities with slight improvement on the right.   Results/Tests Pending at Time of Discharge: none  Discharge Medications:    Medication List    STOP taking these medications        acetaminophen 650 MG CR tablet  Commonly known as:  TYLENOL     carvedilol 12.5 MG tablet  Commonly known as:  COREG     hydrochlorothiazide 25 MG tablet  Commonly known as:  HYDRODIURIL      ibuprofen 200 MG tablet  Commonly known as:  ADVIL,MOTRIN     naproxen 500 MG tablet  Commonly known as:  NAPROSYN     THERAFLU MAX-D COLD & FLU PO      TAKE these medications        amitriptyline 50 MG tablet  Commonly known as:  ELAVIL  Take 1 tablet (50 mg total) by mouth at bedtime.     aspirin 81 MG tablet  Take 81 mg by mouth daily.     cyclobenzaprine 10 MG tablet  Commonly known as:  FLEXERIL  Take 1 tablet (10 mg total) by mouth 3 (three) times daily as needed for muscle spasms.     EQ LORATADINE 10 MG tablet  Generic drug:  loratadine  TAKE ONE TABLET BY MOUTH ONCE DAILY     HYDROcodone-acetaminophen 7.5-325 mg/15 ml solution  Commonly known as:  HYCET  One 52mL dose Q6 hours as needed for moderate to severe pain.     menthol-cetylpyridinium 3 MG lozenge  Commonly known as:  CEPACOL REGULAR STRENGTH  Take 1 lozenge (3 mg total) by mouth as needed for sore throat.     metoprolol tartrate 25 MG tablet  Commonly known as:  LOPRESSOR  Take 1 tablet (25 mg total) by mouth 2 (two) times daily.     omeprazole 20 MG capsule  Commonly known as:  PRILOSEC  TAKE  ONE CAPSULE BY MOUTH ONCE DAILY     PROVENTIL HFA 108 (90 BASE) MCG/ACT inhaler  Generic drug:  albuterol  INHALE TWO PUFFS BY MOUTH EVERY 6 HOURS AS NEEDED FOR  WHEEZING  OR  SHORTNESS  OF  BREATH        Discharge Instructions: Please refer to Patient Instructions section of EMR for full details.  Patient was counseled important signs and symptoms that should prompt return to medical care, changes in medications, dietary instructions, activity restrictions, and follow up appointments.   Follow-Up Appointments: Follow-up Information    Schedule an appointment as soon as possible for a visit with Ruby Cola, MD.   Specialty:  Otolaryngology   Why:  in 1 week   Contact information:   [address] [place] [address] [phone]       Follow up with Virl Axe, MD.    Specialty:  Cardiology   Why:  Re-schedule Holter Monitor and Stress Test   Contact information:   [address] [place] [address] [phone]       Follow up with Tommi Rumps, MD On 12/23/2014.   Specialty:  Family Medicine   Why:  at 3:30PM   Contact information:   [place] [address] [phone]       Follow up with [facility].   Specialty:  Rehabilitation   Why:  You will receive a call to schedule an appointment for outpt PT/OT evaluation and treatment   Contact information:   [address] [place]      Archie Patten, MD 12/15/2014, 3:46 PM PGY-1, [place]

## 2014-12-12 NOTE — Progress Notes (Signed)
Sputum sample sent this date was rejected, placed another collection device in room, RN aware.

## 2014-12-12 NOTE — Progress Notes (Signed)
Sputum sample labelled/sent to Lab per existing order, RN aware-printed off collection requisition, Chest vest used X1 prior to lunch, then pulled, pt. has productive cough with Flutter/Pep,(positive expiratory pressure) device/Incentive Spirometer found in room, pt. found sitting up in chair on 5lpm humidified n/c, goal from Progress notes is to wean Oxygen, able to < to 3 lpm n/c after therapies, RN made aware.

## 2014-12-12 NOTE — Procedures (Signed)
CPT held because due to pt request. Pt is sleeping.

## 2014-12-12 NOTE — Progress Notes (Signed)
Family Medicine Teaching Service Daily Progress Note Intern Pager: [phone]  Patient name: Kristin [name] Medical record number: [id number] Date of birth: Jul 09, 1955 Age: 59 y.o. Gender: female  Primary Care Provider: Tommi Rumps, MD Consultants: ENT Code Status: Full code  Pt Overview and Major Events to Date:  6/4: I&D and tonsilectomy 6/5: Bipap after surgery. Difficulty with wean. Required Lasix 20mg , Ativan 0.5mg  for anxiety with improvement > NRB.  6/6: Multifocal PNA: switched from clinda to Zosyn and Vanc 6/8: Vanc d/c  Assessment and Plan: Kristin [name] is a 59 y.o. female presenting with difficulty swallowing . 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 HR 117-126 in ED. CT neck with evidence of R palatine abscess 2.2x1.4cm and surrounding soft tissue edema. POD #4 from bilateral tonsillectomy.  - ENT recommends: continuing clindamycin, clears until discharge,  Prednisolone with taper (Dr. Simeon Craft states he called in clinda and prednisolone taper). He would also like her to have low dose pain medication (hydrocodone-APAP and ODT zofran on DC. Pt does have prolonged Qtc, if not resolved prior to dc could consider phenergan in place of zofran.  - Continue Hydrocodone -APAP solution - Promethazine PRN Nausea - Continue on clears, will be discharged with s/p tonsillectomy diet - WBC 19.1>17.1>14.0>12.9>15.1>15.2>12.8, Hgb stable - If WBC continues to rise, would consider broadening antibiotics  Respiratory failure: Post surgery the patient required BiPAP with no h/o O2 requirement prior.  BNP slightly elevated to 119.7. CXR prior to surgery revealed atelectasis on the R. Repeat s/p surgery revealed diffuse interstitial infiltrate. Given concerns for PE and need to further characterize infiltrate, CTA was obtained which looked c/w  multifocal PNA without evidence of PE.  Troponins consistent with demand ischemia due to illness: 0.04 >0.90 >0.03. Lactic acid was normal and procalcitonin was 0.35  Could be ARDS given acute drop an h/o recent surgery. CXR from 6/8 revealed worsened opacities, however slight improvement noted on the R on 6/10. Currently on [place] 5 (satting 94-96% while sleeping) - Blood cultures obtained and NGTD - Continue to wean O2 as tolerated - Switched from [place] to [place], now Zosyn alone (D4/7). - Repeat CXR this AM with persistent bilateral airspace opacities with slight improvement on the right. - Consider transition to Augmentin 875 q8hrs  - No coverage for atypicals or prednisone per PCCM - CCM recs: Continue supplemental O2 and wean as tolerated, can probably switch to Augmentin in next 24hrs, transfer out of SDU - cardiology signed off: Cont Metoprolol 25mg  BID, ASA 81, reschedule Myoview at discharge. - normal EF, grade 1 DD  Tachycardia: has a h/o this from reviewing old notes, however could be secondary to infection. Patient was scheduled to have echo and myoview done this upcoming week with Dr Caryl Comes. EKG with sinus tach and prolonged QTc 539. Infectious vs respiratory distress vs baseline (was supposed to have work up ) Metoprolol 25 mg BID at home.  - Myoview needs to be rescheduled at discharge - continue to monitor  - cards recs as above  Hypokalemia: likely 2/2 decreased PO. K 2.9 in ED. On HCTZ at home.  - hold HCTZ for now. - continue to monitor K 3.7 >3.3>3.8 >4.5 >4.8 - Will hold KDUR 37mEq BID for now - Monitor and replete PRN  HTN: normotensive currently - Metoprolol 25mg  BID - Hold HCTZ pending BP's on home metoprolol  GERD: Prilosec at  home -Protonix while hospitalized  Depression:  -Home Amitriptyline   Renal sclerosis: Cr at baseline on admission (0.91-1.06). Only has one fucntioning kidney. CT neck was performed with contrast. - Cr stable today (0.82) -  Will continue to monitor   FEN/GI: IV saline lock, soft cool foods, PPI Prophylaxis:  SQ heparin  Disposition: transfer out of SDU  Disposition: Home pending clinical improvement with ENT f/u 3-4 weeks  Subjective:  Patient doing well. Still some SOB but significantly improved. Occ her throat feels scratchy, but in general doing well w/o pain. Tolerating new diet. No chest pain.  Objective: Temp:  [98.1 F (36.7 C)-99.3 F (37.4 C)] 98.1 F (36.7 C) (06/10 0341) Pulse Rate:  [94-127] 94 (06/10 0341) Resp:  [19-38] 24 (06/10 0341) BP: (107-132)/(51-70) 113/61 mmHg (06/10 0341) SpO2:  [90 %-98 %] 98 % (06/10 0341) Physical Exam: General: Sleeping in bed in NAD HEENT: MMM, Uvula midline. Tonsils absent.   Cardiovascular: tachycardic, rhythm regular, no murmurs, rubs, gallops. No pitting edema. 2+ DP pulses Respiratory: No increased WOB. Coarse crackles noted at the bases bilaterally. No wheezing/rhonchi noted. Currently on 5L [place] with humidification satting 94-96% while sleeping.  Abdomen: +BS, obese, NT/ND Ext: no erythema or edema, no evidence of DVT  Laboratory:  Recent Labs Lab 12/10/14 0320 12/11/14 0306 12/12/14 0225  WBC 15.1* 15.2* 12.8*  HGB 10.1* 10.0* 9.7*  HCT 31.3* 30.8* 29.8*  PLT 230 241 310    Recent Labs Lab 12/10/14 0320 12/11/14 0306 12/12/14 0225  NA 136 135 135  K 3.8 4.5 4.8  CL 98* 100* 98*  CO2 29 24 27   BUN 9 8 7   CREATININE 0.74 0.88 0.82  CALCIUM 8.6* 8.6* 8.9  GLUCOSE 142* 151* 115*  Trop: 0.04>0.06>0.03 HIV negative  BNP 110>59 Lactic acid 1.3>1.6 Procalcitonin 0.35 >0.31>0.15  Imaging/Diagnostic Tests: Dg Neck Soft Tissue 6/4 Abnormal prevertebral soft tissue prominence at C2-3, new since the prior exams. The possibility of prevertebral abscess should be considered. Electronically Signed By: Lorriane Shire M.D. On: 12/06/2014 15:19   6/4 Dg Chest 2 View  IMPRESSION: Subsegmental atelectasis RIGHT middle lobe.   Ct  Soft Tissue Neck W Contrast 12/06/2014 1. Evolving 2.2 x 1.4 cm abscess at the deep portion of the right palatine tonsil, with associated diffuse soft tissue edema and right-sided effacement of the upper hypopharynx. Soft tissue edema extends inferiorly to the level of the valleculae and piriform sinuses, with effacement of the left piriform sinus. Soft tissue edema also extends superiorly along the right side of the oropharynx. 2. Prevertebral soft tissue edema extends to the inferior hypopharynx, with minimal prevertebral soft tissue thickening. Diffuse asymmetric soft tissue inflammation at the right parapharyngeal fat planes. 3. Mild asymmetric prominence of the right submandibular gland, with surrounding soft tissue inflammation tracking about the right side of the neck. Minimal soft tissue stranding extends to the inferior floor of the mouth, without significant Ludwig's angina at this time. 4. Mildly enlarged bilateral cervical nodes seen, measuring up to 1.3 cm in short axis. These are somewhat more prominent on the right. 5. Mild degenerative change noted along the cervical spine.   6/6 Dg Chest 2 View Diffuse interstitial infiltrate bilaterally. No sizable effusion is noted.  6/6 CTA chest  1. No evidence for acute pulmonary embolus. 2. Bilateral upper and lower lobe airspace opacities compatible with multifocal pneumonia.  6/8 Dg Chest  Worsened airspace opacities in the central and basilar regions.  Echocardiogram: EF 55-60%, no RMWA, grade 1 diastolic  dysfunction  6/10 Dg Chest  persistent bilateral airspace opacities with slight improvement on the right.   Archie Patten, MD 12/12/2014, 6:09 AM PGY-1, [place] Intern pager: [phone], text pages welcome

## 2014-12-12 NOTE — Progress Notes (Signed)
Continues to improve No new complaints  Filed Vitals:   12/11/14 2156 12/11/14 2338 12/12/14 0341 12/12/14 0800  BP: 116/63 108/67 113/61 118/70  Pulse: 119 102 94 105  Temp:  98.5 F (36.9 C) 98.1 F (36.7 C) 98.5 F (36.9 C)  TempSrc:  Oral Oral Oral  Resp:  22 24 24   Height:      Weight:      SpO2:  94% 98% 92%   NAD JVP cannot be visualized Improved rhonchi RRR s M Obese, soft, NT Ext warm without edema  CXR: poor inspiratory effort. Despite this, infiltrates appear improved  IMPRESSION: Tonsillar abscess - s/p drainage Acute hypoxic resp failure due to ALI/ARDS vs PNA- resolving  PLAN: Cont supportive care Wean O2 to off as tolerated Augmentin initiated today - would complete another 5 days abx Rec a PA/lat CXR prior to discharge  Should be ready for discharge from pulm perspective once no longer requiring O2    PCCM will sign off. Please call if we can be of further assistance  Merton Border, MD ; [facility] [phone].  After 5:30 PM or weekends, call [phone]

## 2014-12-12 NOTE — Progress Notes (Signed)
Family Medicine Teaching Service Daily Progress Note Intern Pager: [phone]  Patient name: Kristin [name] Medical record number: [id number] Date of birth: 02-Oct-1955 Age: 59 y.o. Gender: female  Primary Care Provider: Tommi Rumps, MD Consultants: ENT Code Status: Full code  Pt Overview and Major Events to Date:  6/4: I&D and tonsilectomy 6/5: Bipap after surgery. Difficulty with wean. Required Lasix 20mg , Ativan 0.5mg  for anxiety with improvement > NRB.  6/6: Multifocal PNA: switched from clinda to Zosyn and Vanc 6/8: Vanc d/c, Zosyn alone  6/10: Transitioned to Augmentin  Assessment and Plan: [name] is a 59 y.o. female presenting with difficulty swallowing . 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  BNP slightly elevated to 119.7. CXR prior to surgery revealed atelectasis on the R. Repeat s/p surgery revealed diffuse interstitial infiltrate. Given concerns for PE and need to further characterize infiltrate, CTA was obtained which looked c/w multifocal PNA without evidence of PE.  Troponins consistent with demand ischemia due to illness: 0.04 >0.90 >0.03. Lactic acid was normal and procalcitonin was 0.35  Could be ARDS given acute drop an h/o recent surgery. CXR from 6/8 revealed worsened opacities, however slight improvement noted on the R on 6/10. Repeat CXR 6/10 with persistent bilateral airspace opacities with slight improvement on the right. Currently on 2L [place] (satting 96%) - Blood cultures obtained and NGTD - Continue to wean O2 as tolerated - Switched from [place] to [employer], to Zosyn, and now Augmentin (D5/10) - No coverage for atypicals or prednisone per PCCM - CCM recs: Continue supplemental O2 and wean as tolerated, increase Augmentin duration, repeat CXR prior to  discharge.  - cardiology signed off: Cont Metoprolol 25mg  BID, ASA 81, reschedule Myoview at discharge. 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 HR 117-126 in ED. CT neck with evidence of R palatine abscess 2.2x1.4cm and surrounding soft tissue edema. POD #5 from bilateral tonsillectomy.  - Touch base with Dr. Simeon Craft (ENT) concerning discharge antibiotic clindamycin and prednisolone with taper (Dr. Simeon Craft states he called in clinda and prednisolone taper). He would also like her to have low dose pain medication (hydrocodone-APAP and ODT zofran on DC.  - Continue Hydrocodone -APAP solution (not requiring) - Promethazine PRN Nausea - Continue on clears, will be discharged with s/p tonsillectomy diet - WBC 15.2>12.8>12.3, Hgb stable - If WBC continues to rise, would consider broadening antibiotics  Tachycardia: has a h/o this from reviewing old notes, however could be secondary to infection. Patient was scheduled to have echo and myoview done this upcoming week with Dr Caryl Comes. EKG with sinus tach and prolonged QTc 539. Infectious vs respiratory distress vs baseline (was supposed to have work up ) Metoprolol 25 mg BID at home.  - Myoview needs to be rescheduled at discharge - continue to monitor  - cards recs as above  Hypokalemia: likely 2/2 decreased PO. K 2.9 in ED. On HCTZ at home.  - hold HCTZ for now. - continue to monitor K 3.7 >3.3>3.8 >4.5 >4.8>4.2 - Monitor and replete PRN  HTN: normotensive currently - Metoprolol 25mg  BID - Hold HCTZ pending BP's on home metoprolol  GERD: Prilosec at home -Protonix while hospitalized  Depression:  -Home Amitriptyline   Renal sclerosis: Cr at baseline on admission (0.91-1.06). Only has one fucntioning kidney. CT neck was  performed with contrast. - Cr stable - Will continue to monitor   FEN/GI: IV saline lock, soft cool foods, PPI Prophylaxis:  SQ heparin  Disposition: Home pending clinical  improvement with ENT f/u 2 weeks, PT to evaluate pat.   Subjective:  SOB improving. Occasionally has an "itch" in her throat."  No problems eating/swallowing. No chest pain.  Objective: Temp:  [98.1 F (36.7 C)-98.6 F (37 C)] 98.5 F (36.9 C) (06/11 0502) Pulse Rate:  [97-114] 97 (06/11 0502) Resp:  [20-24] 20 (06/11 0502) BP: (105-118)/(62-70) 109/62 mmHg (06/11 0502) SpO2:  [92 %-100 %] 95 % (06/11 0502) Physical Exam: General: Sitting up in bed in NAD HEENT: MMM, Uvula midline. Tonsils absent.   Cardiovascular: tachycardic, rhythm regular, no murmurs, rubs, gallops. No pitting edema. 2+ DP pulses Respiratory: No increased WOB. Minimal crackles noted at the bases bilaterally. No wheezing/rhonchi noted. Currently on 2L [place] with humidification satting 94-96% while sleeping.  Abdomen: +BS, obese, NT/ND Ext: no erythema or edema, negative Homans  Laboratory:  Recent Labs Lab 12/11/14 0306 12/12/14 0225 12/13/14 0440  WBC 15.2* 12.8* 12.3*  HGB 10.0* 9.7* 9.9*  HCT 30.8* 29.8* 31.1*  PLT 241 310 333    Recent Labs Lab 12/11/14 0306 12/12/14 0225 12/13/14 0440  NA 135 135 135  K 4.5 4.8 4.2  CL 100* 98* 97*  CO2 24 27 27   BUN 8 7 6   CREATININE 0.88 0.82 0.78  CALCIUM 8.6* 8.9 9.0  GLUCOSE 151* 115* 104*  Trop: 0.04>0.06>0.03 HIV negative  BNP 110>59 Lactic acid 1.3>1.6 Procalcitonin 0.35 >0.31>0.15  Imaging/Diagnostic Tests: Dg Neck Soft Tissue 6/4 Abnormal prevertebral soft tissue prominence at C2-3, new since the prior exams. The possibility of prevertebral abscess should be considered. Electronically Signed By: Lorriane Shire M.D. On: 12/06/2014 15:19   6/4 Dg Chest 2 View  IMPRESSION: Subsegmental atelectasis RIGHT middle lobe.   Ct Soft Tissue Neck W Contrast 12/06/2014 1. Evolving 2.2 x 1.4 cm abscess at the deep portion of the right palatine tonsil, with associated diffuse soft tissue edema and right-sided effacement of the upper hypopharynx.  Soft tissue edema extends inferiorly to the level of the valleculae and piriform sinuses, with effacement of the left piriform sinus. Soft tissue edema also extends superiorly along the right side of the oropharynx. 2. Prevertebral soft tissue edema extends to the inferior hypopharynx, with minimal prevertebral soft tissue thickening. Diffuse asymmetric soft tissue inflammation at the right parapharyngeal fat planes. 3. Mild asymmetric prominence of the right submandibular gland, with surrounding soft tissue inflammation tracking about the right side of the neck. Minimal soft tissue stranding extends to the inferior floor of the mouth, without significant Ludwig's angina at this time. 4. Mildly enlarged bilateral cervical nodes seen, measuring up to 1.3 cm in short axis. These are somewhat more prominent on the right. 5. Mild degenerative change noted along the cervical spine.   6/6 Dg Chest 2 View Diffuse interstitial infiltrate bilaterally. No sizable effusion is noted.  6/6 CTA chest  1. No evidence for acute pulmonary embolus. 2. Bilateral upper and lower lobe airspace opacities compatible with multifocal pneumonia.  6/8 Dg Chest  Worsened airspace opacities in the central and basilar regions.  Echocardiogram: EF 55-60%, no RMWA, grade 1 diastolic dysfunction  1/44 Dg Chest  persistent bilateral airspace opacities with slight improvement on the right.   Archie Patten, MD 12/13/2014, 7:25 AM PGY-1, [place] Intern pager: [phone], text pages welcome

## 2014-12-13 LAB — BASIC METABOLIC PANEL
Anion gap: 11 (ref 5–15)
BUN: 6 mg/dL (ref 6–20)
CALCIUM: 9 mg/dL (ref 8.9–10.3)
CO2: 27 mmol/L (ref 22–32)
Chloride: 97 mmol/L — ABNORMAL LOW (ref 101–111)
Creatinine, Ser: 0.78 mg/dL (ref 0.44–1.00)
GFR calc non Af Amer: >60 mL/min (ref >60)
Glucose, Bld: 104 mg/dL — ABNORMAL HIGH (ref 65–99)
POTASSIUM: 4.2 mmol/L (ref 3.5–5.1)
SODIUM: 135 mmol/L (ref 135–145)

## 2014-12-13 LAB — CBC
HCT: 31.1 % — ABNORMAL LOW (ref 36.0–46.0)
Hemoglobin: 9.9 g/dL — ABNORMAL LOW (ref 12.0–15.0)
MCH: 28.7 pg (ref 26.0–34.0)
MCHC: 31.8 g/dL (ref 30.0–36.0)
MCV: 90.1 fL (ref 78.0–100.0)
PLATELETS: 333 10*3/uL (ref 150–400)
RBC: 3.45 MIL/uL — AB (ref 3.87–5.11)
RDW: 13.6 % (ref 11.5–15.5)
WBC: 12.3 10*3/uL — ABNORMAL HIGH (ref 4.0–10.5)

## 2014-12-13 NOTE — Evaluation (Signed)
Physical Therapy Evaluation Patient Details Name: Kristin [name] MRN: [id number] DOB: 17-Aug-1955 Today's Date: 12/13/2014   History of Present Illness  Patient admittef for tonsilectomy and developed ARDS post-op.  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  Patient does ambulate with antalgic gait due to left hip pain/weakness.  She was supposed to start OPPT before admission.  I do recommend patient follow up with OPPT to assess hip. Feel patient safe for d/c from PT standpoint.  She did have slight drop in O2 sats to 88 immediately post ambulation, quickly rebounded.  Encouraged patient to ambulate throughout day.      Follow Up Recommendations Outpatient PT (for hip pain/weakness)    Equipment Recommendations  None recommended by PT    Recommendations for Other Services       Precautions / Restrictions Precautions Precautions: Fall Restrictions Weight Bearing Restrictions: No      Mobility  Bed Mobility               General bed mobility comments: not tested, patient in chair  Transfers Overall transfer level: Independent Equipment used: None                Ambulation/Gait Ambulation/Gait assistance: Independent Ambulation Distance (Feet): 80 Feet Assistive device: None Gait Pattern/deviations: Antalgic     General Gait Details: patient did get SOB during ambulation requiring a rest break; tolerated perturbations durings gait with no loss of balance.  Patient with antalgic gait due to left hip pain (which was premorbid to admission)  Stairs            Wheelchair Mobility    Modified Rankin (Stroke Patients Only)       Balance Overall balance assessment: No apparent balance deficits (not formally assessed)                                           Pertinent Vitals/Pain Pain Assessment:  0-10 Pain Score: 4  Pain Location: left hip Pain Descriptors / Indicators: Aching Pain Intervention(s): Limited activity within patient's tolerance;Monitored during session    Home Living Family/patient expects to be discharged to:: Private residence Living Arrangements: Children;Spouse/significant other Available Help at Discharge: Family Type of Home: House Home Access: Stairs to enter Entrance Stairs-Rails: None Technical brewer of Steps: 4 Home Layout: One level Home Equipment: None      Prior Function Level of Independence: Independent               Hand Dominance        Extremity/Trunk Assessment   Upper Extremity Assessment: Overall WFL for tasks assessed           Lower Extremity Assessment: Overall WFL for tasks assessed      Cervical / Trunk Assessment: Normal  Communication   Communication: No difficulties  Cognition Arousal/Alertness: Awake/alert Behavior During Therapy: WFL for tasks assessed/performed Overall Cognitive Status: Within Functional Limits for tasks assessed                      General Comments      Exercises        Assessment/Plan    PT Assessment All further PT needs can be met in the next venue of care  PT Diagnosis Generalized weakness  PT Problem List Decreased strength;Pain  PT Treatment Interventions     PT Goals (Current goals can be found in the Care Plan section) Acute Rehab PT Goals Patient Stated Goal: go home  PT Goal Formulation: All assessment and education complete, DC therapy    Frequency     Barriers to discharge        Co-evaluation               End of Session Equipment Utilized During Treatment: Gait belt Activity Tolerance: Patient tolerated treatment well Patient left: in chair;with call bell/phone within reach           Time: 1119-1132 PT Time Calculation (min) (ACUTE ONLY): 13 min   Charges:   PT Evaluation $Initial PT Evaluation Tier I: 1 Procedure      PT G CodesShanna Cisco 12/13/2014, 11:35 AM  12/13/2014 [place], PT [phone]

## 2014-12-14 ENCOUNTER — Telehealth: Payer: Self-pay | Admitting: Family Medicine

## 2014-12-14 LAB — BASIC METABOLIC PANEL
Anion gap: 9 (ref 5–15)
BUN: 6 mg/dL (ref 6–20)
CO2: 28 mmol/L (ref 22–32)
CREATININE: 0.86 mg/dL (ref 0.44–1.00)
Calcium: 9 mg/dL (ref 8.9–10.3)
Chloride: 97 mmol/L — ABNORMAL LOW (ref 101–111)
GFR calc non Af Amer: >60 mL/min (ref >60)
Glucose, Bld: 111 mg/dL — ABNORMAL HIGH (ref 65–99)
Potassium: 4.1 mmol/L (ref 3.5–5.1)
Sodium: 134 mmol/L — ABNORMAL LOW (ref 135–145)

## 2014-12-14 LAB — CBC
HCT: 30.3 % — ABNORMAL LOW (ref 36.0–46.0)
Hemoglobin: 9.7 g/dL — ABNORMAL LOW (ref 12.0–15.0)
MCH: 28.8 pg (ref 26.0–34.0)
MCHC: 32 g/dL (ref 30.0–36.0)
MCV: 89.9 fL (ref 78.0–100.0)
PLATELETS: 389 10*3/uL (ref 150–400)
RBC: 3.37 MIL/uL — ABNORMAL LOW (ref 3.87–5.11)
RDW: 13.5 % (ref 11.5–15.5)
WBC: 12.1 10*3/uL — AB (ref 4.0–10.5)

## 2014-12-14 MED ORDER — HYDROCODONE-ACETAMINOPHEN 7.5-325 MG/15ML PO SOLN: ORAL | Status: DC

## 2014-12-14 MED ORDER — AMOXICILLIN-POT CLAVULANATE 875-125 MG PO TABS: 1.0000 | ORAL_TABLET | Freq: Two times a day (BID) | ORAL | Status: DC

## 2014-12-14 MED ORDER — MENTHOL 3 MG MT LOZG: 1.0000 | LOZENGE | OROMUCOSAL | Status: DC | PRN

## 2014-12-14 MED ORDER — AMOXICILLIN-POT CLAVULANATE 875-125 MG PO TABS: 1.0000 | ORAL_TABLET | Freq: Two times a day (BID) | ORAL | Status: AC

## 2014-12-14 NOTE — Progress Notes (Signed)
Pt does flutter valve on her own, no distress noted at this time. bbs clear diminished in bases.

## 2014-12-14 NOTE — Telephone Encounter (Signed)
[facility] After Hours  Patient is a 59yo female that was discharged from the hospital this afternoon. She states that the Augmentin is too expensive for her at [place]. Medication available cheaper at [facility]. Rewrote prescription for Augmentin and sent to [facility] on [address]. Explained that the store closes early today and she states she will have her daughter pick up the prescription.  Cordelia Poche, MD PGY-2, [facility] 12/14/2014, 5:04 PM

## 2014-12-14 NOTE — Progress Notes (Signed)
Resting o2sat 97%.walking 92% resting 95%,pt. tol well no c/o SOB.

## 2014-12-14 NOTE — Care Management Note (Signed)
Case Management Note  Patient Details  Name: BETHANEE REDONDO MRN: [id number] Date of Birth: 03-08-1956  Subjective/Objective:                    Action/Plan:   Expected Discharge Date:                  Expected Discharge Plan:  Home/Self Care  In-House Referral:     Discharge planning Services  CM Consult  Post Acute Care Choice:    Choice offered to:     DME Arranged:    DME Agency:     HH Arranged:    [place] Agency:     Status of Service:     Medicare Important Message Given:    Date Medicare IM Given:    Medicare IM give by:    Date Additional Medicare IM Given:    Additional Medicare Important Message give by:     If discussed at [place] of Stay Meetings, dates discussed:    Additional Comments: CM received a telephone order to please [facility] PT/OT to schedule an appt for eval and treat.  CM faxed necessary info to [facility]. Dellie Catholic, RN 12/14/2014, 5:41 PM

## 2014-12-14 NOTE — Progress Notes (Signed)
Family Medicine Teaching Service Daily Progress Note Intern Pager: [phone]  Patient name: Kristin [name] Medical record number: [id number] Date of birth: 19-Apr-1956 Age: 59 y.o. Gender: female  Primary Care Provider: Tommi Rumps, MD Consultants: ENT (signed off), CCM (signed off), Cardiology (signed off) Code Status: Full code  Pt Overview and Major Events to Date:  6/4: I&D and tonsilectomy 6/5: Bipap after surgery. Difficulty with wean. Required Lasix 20mg , Ativan 0.5mg  for anxiety with improvement > NRB.  6/6: Multifocal PNA: switched from clinda to Zosyn and Vanc 6/8: Vanc d/c, Zosyn alone  6/10: Transitioned to Augmentin  Assessment and Plan: Kristin [name] is a 59 y.o. female presenting with difficulty swallowing . 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 Currently being treated for pneumonia but overal picture very unclear. Possible ARDS/ALI picture as well. Patient, however, has improved dramatically, currently on room air without desaturation during ambulation and feeling much better.  - Blood cultures obtained and NGTD - Continue Augmentin (D7/10) - No coverage for atypicals or prednisone per PCCM - cardiology signed off: Cont Metoprolol 25mg  BID, ASA 81, reschedule Myoview at discharge. 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 POD #7 - Continue Hydrocodone -APAP solution (not requiring) - Promethazine PRN Nausea - Continue on soft diet  Tachycardia: has a h/o this from reviewing old notes, however could be secondary to infection. Patient was scheduled to have echo and myoview done this upcoming week with Dr Caryl Comes. EKG with sinus tach and prolonged QTc 539. Infectious vs respiratory distress vs baseline (was supposed to have work up ) Metoprolol 25 mg BID at home.  -  Myoview needs to be rescheduled at discharge - continue to monitor  - cards recs as above  Hypokalemia: resolved - hold HCTZ for now. - Monitor and replete PRN  HTN: normotensive currently - Metoprolol 25mg  BID - Hold HCTZ pending BP's on home metoprolol  GERD: Prilosec at home -Protonix while hospitalized  Depression:  -Home Amitriptyline   Renal sclerosis: Cr at baseline on admission (0.91-1.06). Only has one fucntioning kidney. CT neck was performed with contrast. - Cr stable - Will continue to monitor   FEN/GI: IV saline lock, soft cool foods, PPI Prophylaxis:  SQ heparin  Disposition: Home pending clinical improvement with ENT f/u 2 weeks, Outpatient PT  Subjective:  Patient reports no shortness of breath or chest pain. She states she still has a scratchy dry throat but that medication is helping. No issues with diet.   Objective: Temp:  [97.8 F (36.6 C)-98.8 F (37.1 C)] 98.8 F (37.1 C) (06/12 0600) Pulse Rate:  [95-99] 95 (06/12 0600) Resp:  [20] 20 (06/12 0600) BP: (108-134)/(66-72) 134/72 mmHg (06/12 0600) SpO2:  [88 %-97 %] 97 % (06/12 0600) Physical Exam: General: Sitting up in a chair in NAD Cardiovascular: tachycardic, rhythm regular, no murmurs, rubs, gallops. No pitting edema. 2+ DP pulses Respiratory: No increased WOB. Minimal crackles noted at the bases bilaterally. No wheezing/rhonchi noted.  Abdomen: +BS, obese, NT/ND Ext: no erythema or edema  Laboratory:  Recent Labs Lab 12/12/14 0225 12/13/14 0440 12/14/14 0418  WBC 12.8* 12.3* 12.1*  HGB 9.7* 9.9* 9.7*  HCT 29.8* 31.1* 30.3*  PLT 310 333 389    Recent Labs Lab 12/12/14 0225 12/13/14 0440 12/14/14 0418  NA 135 135 134*  K 4.8 4.2 4.1  CL 98* 97* 97*  CO2 27 27 28   BUN 7 6 6   CREATININE 0.82 0.78 0.86  CALCIUM 8.9 9.0 9.0  GLUCOSE 115* 104* 111*  Trop: 0.04>0.06>0.03 HIV negative  BNP 110>59 Lactic acid 1.3>1.6 Procalcitonin 0.35 >0.31>0.15  Imaging/Diagnostic  Tests:  No results found.   Mariel Aloe, MD 12/14/2014, 9:17 AM PGY-2, [place] Intern pager: [phone], text pages welcome

## 2014-12-15 LAB — CULTURE, BLOOD (ROUTINE X 2)
CULTURE: NO GROWTH
Culture: NO GROWTH

## 2014-12-18 ENCOUNTER — Ambulatory Visit (HOSPITAL_COMMUNITY)
Admission: RE | Admit: 2014-12-18 | Discharge: 2014-12-18 | Disposition: A | Discharge status: Home / Self Care | Payer: No Typology Code available for payment source | Source: Ambulatory Visit | Attending: Family Medicine | Admitting: Family Medicine

## 2014-12-18 DIAGNOSIS — M25552 Pain in left hip: Secondary | ICD-10-CM | POA: Insufficient documentation

## 2014-12-18 DIAGNOSIS — M25851 Other specified joint disorders, right hip: Secondary | ICD-10-CM | POA: Insufficient documentation

## 2014-12-19 ENCOUNTER — Telehealth: Payer: Self-pay | Admitting: Family Medicine

## 2014-12-19 DIAGNOSIS — M1612 Unilateral primary osteoarthritis, left hip: Secondary | ICD-10-CM

## 2014-12-19 NOTE — Telephone Encounter (Signed)
Called patient to discuss X-ray results which she just got a month after her office visit. Advised of the arthritic changes and the possible AVN of the left hip. Will refer to ortho for further evaluation of this issue.

## 2014-12-22 ENCOUNTER — Telehealth: Payer: Self-pay | Admitting: Family Medicine

## 2014-12-22 ENCOUNTER — Inpatient Hospital Stay: Payer: No Typology Code available for payment source | Admitting: Family Medicine

## 2014-12-22 NOTE — Telephone Encounter (Signed)
Kristin [name] is requesting a referral to Dr Ruby Cola, ENT.

## 2014-12-22 NOTE — Telephone Encounter (Signed)
Did patient mention what she needed to be seen for?  [facility]

## 2014-12-23 ENCOUNTER — Ambulatory Visit (INDEPENDENT_AMBULATORY_CARE_PROVIDER_SITE_OTHER): Payer: No Typology Code available for payment source | Admitting: Family Medicine

## 2014-12-23 ENCOUNTER — Ambulatory Visit: Payer: No Typology Code available for payment source | Admitting: Internal Medicine

## 2014-12-23 ENCOUNTER — Ambulatory Visit (INDEPENDENT_AMBULATORY_CARE_PROVIDER_SITE_OTHER): Payer: No Typology Code available for payment source

## 2014-12-23 ENCOUNTER — Encounter: Payer: Self-pay | Admitting: Family Medicine

## 2014-12-23 ENCOUNTER — Other Ambulatory Visit: Payer: Self-pay | Admitting: Internal Medicine

## 2014-12-23 VITALS — BP 131/67 | HR 95 | Temp 98.4°F | Ht 62.0 in | Wt 229.0 lb

## 2014-12-23 DIAGNOSIS — M160 Bilateral primary osteoarthritis of hip: Secondary | ICD-10-CM

## 2014-12-23 DIAGNOSIS — R404 Transient alteration of awareness: Secondary | ICD-10-CM

## 2014-12-23 DIAGNOSIS — R4020 Unspecified coma: Secondary | ICD-10-CM

## 2014-12-23 DIAGNOSIS — E041 Nontoxic single thyroid nodule: Secondary | ICD-10-CM

## 2014-12-23 DIAGNOSIS — R55 Syncope and collapse: Secondary | ICD-10-CM

## 2014-12-23 DIAGNOSIS — Z9089 Acquired absence of other organs: Secondary | ICD-10-CM

## 2014-12-23 DIAGNOSIS — J36 Peritonsillar abscess: Secondary | ICD-10-CM

## 2014-12-23 DIAGNOSIS — Z9889 Other specified postprocedural states: Secondary | ICD-10-CM

## 2014-12-23 DIAGNOSIS — I10 Essential (primary) hypertension: Secondary | ICD-10-CM

## 2014-12-23 DIAGNOSIS — J9601 Acute respiratory failure with hypoxia: Secondary | ICD-10-CM

## 2014-12-23 LAB — T4: T4 TOTAL: 8.6 ug/dL (ref 4.5–12.0)

## 2014-12-23 MED ORDER — NAPROXEN 500 MG PO TABS: 500.0000 mg | ORAL_TABLET | Freq: Two times a day (BID) | ORAL | Status: DC

## 2014-12-23 NOTE — Progress Notes (Signed)
Patient is due for a screening mammogram.  Information on imaging center given to patient to call and schedule mammogram appointment.   

## 2014-12-23 NOTE — Patient Instructions (Signed)
Nice to see you. We will refer you to ENT for follow-up after your surgery. Please call [place] radiology to reschedule your ultrasound. Call PT to determine how many visits you get and if there is a limit wait until you have seen ortho to determine plan for PT.  If you develop fever, trouble swallowing, difficulty breathing, or chest pain please seek medical attention.

## 2014-12-24 ENCOUNTER — Encounter: Payer: Self-pay | Admitting: Family Medicine

## 2014-12-24 ENCOUNTER — Ambulatory Visit: Payer: No Typology Code available for payment source

## 2014-12-24 LAB — T3, FREE: T3 FREE: 2.9 pg/mL (ref 2.3–4.2)

## 2014-12-25 ENCOUNTER — Telehealth (HOSPITAL_COMMUNITY): Payer: Self-pay | Admitting: *Deleted

## 2014-12-25 DIAGNOSIS — E041 Nontoxic single thyroid nodule: Secondary | ICD-10-CM | POA: Insufficient documentation

## 2014-12-25 NOTE — Assessment & Plan Note (Signed)
Doing well today. Breathing normally with normal O2 sat. Possibly had HCAP vs ARDS. Will need to consider repeat CXR in 3-4 weeks to evaluate for infiltrate improvement. Given return precautions.

## 2014-12-25 NOTE — Progress Notes (Signed)
Patient ID: Kristin [name], female   DOB: February 20, 1956, 59 y.o.   MRN: [id number]  Tommi Rumps, MD Phone: [phone]  Kristin [name] is a 59 y.o. female who presents today for f/u.  Patient presents for hospital follow-up s/p tonsillectomy for tonsilar abscess. Subsequently had respiratory issues that were felt to be HCAP vs ARDS. Notes she feels better since discharge. Has finished her augmentin. Has minimal cough at this time. No fevers. No chills. No issues swallowing. No shortness of breath. No chest pain. No edema. No sore throat.   Hip pain: patient notes her hips bother her the most. States the right is worse than the left. No recnet injury. Hurts the most when she is getting in and out of her car. Had hip XR with bilateral degenerative changes and possible AVN on the left. No fevers. Notes taking intermittent NSAIDs for this.   Thyroid nodule: seen on recent MRI cervical spine. Notes that heat bothers her. Some hair loss. No palpitations. No dry skin. Missed appointment for [place] while in the hospital. Had low normal TSH in May.   PMH: nonsmoker.   ROS: Per HPI   Physical Exam Filed Vitals:   12/23/14 1531  BP: 131/67  Pulse: 95  Temp: 98.4 F (36.9 C)    Gen: Well NAD HEENT: PERRL,  MMM, no tonsils, area of surgery with well healing tissue with no swelling, erythema, or drainage, uvula is midline, no cervical swelling or LAD or tenderness Lungs: CTABL Nl WOB Heart: RRR, no murmur appreciated MSK: bilateral hips with discomfort on internal rotation, mild decreased ROM on internal rotation, good hip flexion, normal strength, no swelling, tenderness or erythema of bilateral hips Exts: Non edematous BL  LE, warm and well perfused.    Assessment/Plan: Please see individual problem list.  Tommi Rumps, MD [place] PGY-3

## 2014-12-25 NOTE — Telephone Encounter (Signed)
Patient given detailed instructions per Myocardial Perfusion Study Information Sheet for test on 12/30/14 at 8:15am. Patient Notified to arrive 15 minutes early, and that it is imperative to arrive on time for appointment to keep from having the test rescheduled. Patient verbalized understanding. Hubbard Robinson, RN

## 2014-12-25 NOTE — Assessment & Plan Note (Signed)
" >>  ASSESSMENT AND PLAN FOR HYPERTENSION, BENIGN SYSTEMIC WRITTEN ON 12/25/2014 10:16 AM BY Jazzlin Clements G, MD  At goal today. Will continue to monitor and continue with current medication regimen.  "

## 2014-12-25 NOTE — Assessment & Plan Note (Signed)
Appears to be recovering well from surgery. Normal appearing healing on exam. Will refer to ENT for follow-up. Given return precautions.

## 2014-12-25 NOTE — Assessment & Plan Note (Addendum)
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 Discussed PT for hips and advised to call to see about coverage of PT by the orange card. If limited coverage patient will see ortho prior to starting PT, if unlimited coverage she will start PT prior to seeing ortho. Referral already placed for patient to see ortho for this issue.

## 2014-12-25 NOTE — Assessment & Plan Note (Signed)
Noted on recent MRI cervical spine. Normal thyroid function tests. Will check [place].

## 2014-12-25 NOTE — Assessment & Plan Note (Signed)
At goal today. Will continue to monitor and continue with current medication regimen.

## 2014-12-29 NOTE — Telephone Encounter (Signed)
Lexiscan scheduled for 12/30/14.

## 2014-12-30 ENCOUNTER — Ambulatory Visit (HOSPITAL_COMMUNITY): Payer: No Typology Code available for payment source | Attending: Internal Medicine

## 2014-12-30 DIAGNOSIS — R0609 Other forms of dyspnea: Secondary | ICD-10-CM | POA: Insufficient documentation

## 2014-12-30 DIAGNOSIS — R404 Transient alteration of awareness: Secondary | ICD-10-CM

## 2014-12-30 DIAGNOSIS — R002 Palpitations: Secondary | ICD-10-CM | POA: Insufficient documentation

## 2014-12-30 DIAGNOSIS — R55 Syncope and collapse: Secondary | ICD-10-CM | POA: Insufficient documentation

## 2014-12-30 DIAGNOSIS — I10 Essential (primary) hypertension: Secondary | ICD-10-CM | POA: Insufficient documentation

## 2014-12-30 DIAGNOSIS — R4020 Unspecified coma: Secondary | ICD-10-CM

## 2014-12-30 MED ORDER — REGADENOSON 0.4 MG/5ML IV SOLN
0.4000 mg | Freq: Once | INTRAVENOUS | Status: AC
  Administered 2014-12-30: 0.4 mg via INTRAVENOUS

## 2014-12-30 MED ORDER — TECHNETIUM TC 99M SESTAMIBI GENERIC - CARDIOLITE
31.4000 | Freq: Once | INTRAVENOUS | Status: AC | PRN
  Administered 2014-12-30: 31 via INTRAVENOUS

## 2015-01-02 ENCOUNTER — Ambulatory Visit (HOSPITAL_COMMUNITY): Payer: No Typology Code available for payment source | Attending: Internal Medicine

## 2015-01-02 LAB — MYOCARDIAL PERFUSION IMAGING
LHR: 0.21
LV sys vol: 42 mL
LVDIAVOL: 90 mL
Peak HR: 95 {beats}/min
Rest HR: 88 {beats}/min
SDS: 6
SRS: 1
SSS: 7
TID: 0.89

## 2015-01-02 MED ORDER — TECHNETIUM TC 99M SESTAMIBI GENERIC - CARDIOLITE
32.7000 | Freq: Once | INTRAVENOUS | Status: AC | PRN
  Administered 2015-01-02: 32.7 via INTRAVENOUS

## 2015-01-06 ENCOUNTER — Encounter: Payer: Self-pay | Admitting: Internal Medicine

## 2015-01-06 ENCOUNTER — Ambulatory Visit (INDEPENDENT_AMBULATORY_CARE_PROVIDER_SITE_OTHER): Payer: No Typology Code available for payment source | Admitting: Internal Medicine

## 2015-01-06 VITALS — BP 110/70 | HR 84 | Ht 62.0 in | Wt 228.2 lb

## 2015-01-06 DIAGNOSIS — R55 Syncope and collapse: Secondary | ICD-10-CM

## 2015-01-06 NOTE — Progress Notes (Signed)
Patient Care Team: Smiley Houseman, MD as PCP - General   HPI  Kristin [name] is a 58 y.o. female Seen in followup for syncope and sinus tachycardia  She is feeling well without significant sob; no chest pain or edema  Daytime somnolence and sleep disordered breating  Loss of sexual interest predating bb;  Being treated with increasing doses of amitryptilene   Abnormal ECG prompted echo and myoview demonstrating normla LV function and perfusion And holter >>  HR mean 89 with minimum about 60's 70s  Past Medical History  Diagnosis Date  . Back pain, chronic   . GERD (gastroesophageal reflux disease)   . Obesity   . Tobacco abuse   . Hypertension   . Hyperlipidemia   . Seizures   . Depression   . Somatization disorder   . Renal sclerosis, unspecified     only has 1 kidney    Past Surgical History  Procedure Laterality Date  . Tubal ligation    . Abdominal hysterectomy    . Cholecystectomy    . Tonsillectomy Bilateral 12/06/2014    Procedure: TONSILLECTOMY, BILATERAL;  Surgeon: Ruby Cola, MD;  Location: [facility];  Service: ENT;  Laterality: Bilateral;    Current Outpatient Prescriptions  Medication Sig Dispense Refill  . amitriptyline (ELAVIL) 50 MG tablet Take 1 tablet (50 mg total) by mouth at bedtime. 30 tablet 1  . aspirin 81 MG tablet Take 81 mg by mouth daily.    . cyclobenzaprine (FLEXERIL) 10 MG tablet Take 1 tablet (10 mg total) by mouth 3 (three) times daily as needed for muscle spasms. 90 tablet 2  . metoprolol tartrate (LOPRESSOR) 25 MG tablet Take 1 tablet (25 mg total) by mouth 2 (two) times daily. 60 tablet 6  . naproxen (NAPROSYN) 500 MG tablet Take 500 mg by mouth 2 (two) times daily between meals as needed for mild pain, moderate pain or headache.    Marland Kitchen omeprazole (PRILOSEC) 20 MG capsule TAKE ONE CAPSULE BY MOUTH ONCE DAILY 30 capsule 3  . PROVENTIL HFA 108 (90 BASE) MCG/ACT inhaler INHALE TWO PUFFS BY MOUTH EVERY 6 HOURS AS NEEDED FOR   WHEEZING  OR  SHORTNESS  OF  BREATH 2 each 1   No current facility-administered medications for this visit.    Allergies  Allergen Reactions  . Fluconazole Swelling    REACTION: Face swelling  . Robitussin (Alcohol Free) [Guaifenesin] Palpitations    Chest pain    Review of Systems negative except from HPI and PMH  Physical Exam BP 110/70 mmHg  Pulse 84  Ht 5\' 2"  (1.575 m)  Wt 228 lb 3.2 oz (103.511 kg)  BMI 41.73 kg/m2 Well developed and well nourished in no acute distress HENT normal E scleral and icterus clear Neck Supple JVP flat; carotids brisk and full Clear to ausculation  Regular rate and rhythm, no murmurs gallops or rub Soft with active bowel sounds No clubbing cyanosis  Edema Alert and oriented, grossly normal motor and sensory function Skin Warm and Dry    Assessment and  Plan  Sinus tachy  Syncope  Depression  Sleep disordered breathing   NO evidence of structural heart disease so syncope is consistent with orthostatic syncope and we hve reviewed the importance of recognizing prodrome and becoming recumbent  Sinus tach is not all the day long so will continue on metoprolol  Discussed need for sleep study which is pending and to followup with PCP about antidepressants. i  think less likely that betablockers are responsible for loss of sexual interest but needs to be kept in imnd   followup prn

## 2015-01-06 NOTE — Patient Instructions (Signed)
Medication Instructions:  Your physician recommends that you continue on your current medications as directed. Please refer to the Current Medication list given to you today.  Labwork: No new orders.   Testing/Procedures: No new orders.   Follow-Up: Your physician recommends that you schedule a follow-up appointment as needed with Dr Caryl Comes.    Any Other Special Instructions Will Be Listed Below (If Applicable).

## 2015-01-14 ENCOUNTER — Ambulatory Visit
Payer: No Typology Code available for payment source | Attending: Family Medicine | Admitting: Rehabilitative and Restorative Service Providers"

## 2015-01-14 DIAGNOSIS — M25552 Pain in left hip: Secondary | ICD-10-CM

## 2015-01-14 DIAGNOSIS — M542 Cervicalgia: Secondary | ICD-10-CM

## 2015-01-14 DIAGNOSIS — R531 Weakness: Secondary | ICD-10-CM

## 2015-01-14 NOTE — Patient Instructions (Signed)
Healthy Back - Shoulder Roll   Stand straight with arms relaxed at sides. Roll shoulders backward continuously. Do _5-10___ times. This exercise can also be done one shoulder at a time.  Copyright  VHI. All rights reserved.   AROM: Neck Rotation   Turn head slowly to look over one shoulder, then the other. Hold each position __3__ seconds. Repeat _5___ times per set.  Do __2__ sessions per day.  Work up to 10 times as tolerated.  http://orth.exer.us/295   Copyright  VHI. All rights reserved.   IT Band: Wall Lean With Crossed Leg   Stand with left hand on wall. Cross right leg behind other leg. Stretch hip toward wall with other arm supporting trunk. Hold _20__ seconds. Relax. Repeat _3__ times. Do __1-2_ times a day. Repeat on other side.  Copyright  VHI. All rights reserved.

## 2015-01-15 NOTE — Therapy (Signed)
[facility] [address] [place], [place], [address] Phone: [phone]   Fax:  [phone]  Physical Therapy Evaluation  Patient Details  Name: Kristin [name] MRN: [id number] Date of Birth: Oct 13, 1955 Referring Provider:  Leone Haven, MD  Encounter Date: 01/14/2015      PT End of Session - 01/14/15 1329    Visit Number 1   Number of Visits 8   Date for PT Re-Evaluation 02/14/15   Authorization Type GCCN 03/12/2015 end date with $10 copay   PT Start Time 1235   PT Stop Time 1315   PT Time Calculation (min) 40 min   Activity Tolerance Patient tolerated treatment well   Behavior During Therapy [facility] for tasks assessed/performed      Past Medical History  Diagnosis Date  . Back pain, chronic   . GERD (gastroesophageal reflux disease)   . Obesity   . Tobacco abuse   . Hypertension   . Hyperlipidemia   . Seizures   . Depression   . Somatization disorder   . Renal sclerosis, unspecified     only has 1 kidney    Past Surgical History  Procedure Laterality Date  . Tubal ligation    . Abdominal hysterectomy    . Cholecystectomy    . Tonsillectomy Bilateral 12/06/2014    Procedure: TONSILLECTOMY, BILATERAL;  Surgeon: Ruby Cola, MD;  Location: [facility] OR;  Service: ENT;  Laterality: Bilateral;    There were no vitals filed for this visit.  Visit Diagnosis:  Generalized weakness - Plan: PT plan of care cert/re-cert  Neck pain - Plan: PT plan of care cert/re-cert  Hip pain, left - Plan: PT plan of care cert/re-cert      Subjective Assessment - 01/14/15 1240    Subjective The patient reports onset of hip pain 6 months ago, for which she recently had injection that reduced pain.  She has chronic h/o neck pain x 2 years.  She has 2 separate PT referrals today 1) for neck stating "PT add to order trigger points trapezius, levator scapula, concordant pain 2) hip pain and weakness.   Patient Stated Goals  Reduce pain   Currently in Pain? Yes   Pain Score 7    Pain Location Neck   Pain Orientation Left   Pain Descriptors / Indicators Aching;Sharp;Shooting;Pounding;Throbbing   Pain Radiating Towards L side of face with throbbing (all pain on left side)   Pain Onset More than a month ago   Pain Frequency Constant   Aggravating Factors  turning quickly   Pain Relieving Factors meds reduce, but wear off quickly   Multiple Pain Sites Yes   Pain Score 0  intermittent in nature since injection   Pain Location Hip   Pain Orientation Left   Pain Descriptors / Indicators Sharp   Pain Type Chronic pain   Pain Onset More than a month ago   Pain Frequency Intermittent   Aggravating Factors  lying down at night on left side   Pain Relieving Factors recent injection   Effect of Pain on Daily Activities decreases sleeping            [facility] PT Assessment - 01/14/15 1245    Assessment   Medical Diagnosis hip pain, trigger points trapezius, levator scap   Onset Date/Surgical Date --  years due to chronic pain, referred 12/2013   Precautions   Precautions Fall   Balance Screen   Has the patient fallen in the past 6  months Yes   How many times? 1  after getting up too quickly   Has the patient had a decrease in activity level because of a fear of falling?  No   Is the patient reluctant to leave their home because of a fear of falling?  No   Home Environment   Living Environment Private residence   Living Arrangements --  family   Type of [place] to enter   Entrance Stairs-Number of Steps --  5   Home Layout Two level   ROM / Strength   AROM / PROM / Strength AROM;Strength   AROM   Overall AROM  Deficits   AROM Assessment Site Cervical;Shoulder;Hip   Right/Left Shoulder --  WFLs for A/ROM flexion/abduction/ER/mild tightness IR   Right/Left Hip --  WFLs for ROM, pain with flexion noted with MMT   Cervical Flexion 26 deg   Cervical Extension 38 deg   Cervical  - Right Side Bend 29 deg  with "pulling"   Cervical - Left Side Bend 25 deg   Cervical - Right Rotation 32 deg  with significant pain left side   Cervical - Left Rotation 40 deg   Strength   Overall Strength Comments R LE 5/5, L LE hip flexion 4/5, knee flex/ext 5/5, ankle DF 4/5.  Pt with pain and 3/5 strength L SLR and 5/5 L SLR   Palpation   Palpation comment pain and tightness noted suboccipitals, L upper trap, L scalenes and levator scapula   Special Tests    Special Tests --  pain L greater troch bursa and IT band palpation     THERAPEUTIC EXERCISE: See HEP in patient instructions        PT Education - 01/14/15 1314    Education provided Yes   Education Details HEP: shoulder circles, neck A/ROM rotation, IT band stretch   Person(s) Educated Patient   Methods Explanation;Demonstration;Handout   Comprehension Returned demonstration;Verbalized understanding             PT Long Term Goals - 01/14/15 1328    PT LONG TERM GOAL #1   Title The patient will be indep with HEP for flexibility, postural stabilization, and strengthening.   Baseline Target date 02/14/2015   Time 4   Period Weeks   PT LONG TERM GOAL #2   Title Improve A/ROM neck rotation right from 32 deg up to > or equal to 40 deg, and left from 40 deg up to > or equal to 45 deg.   Baseline Target date 02/14/2015   Time 4   Period Weeks   PT LONG TERM GOAL #3   Title Improve L LE straight leg raise from 3/5 up to 4/5 for improved hip strength/stabilization.   Baseline Target date 02/14/2015   Time 4   Period Weeks   PT LONG TERM GOAL #4   Title The patient will demonstrate decreased bilateral trendelenberg gait per observation (noted with significant pelvic drop each step of gait bilaterally at baseline).   Baseline Target date 02/14/2015   Time 4   Period Weeks   PT LONG TERM GOAL #5   Title Decrease neck disability index from 22% to < or equal to 14%.   Baseline Target date 02/14/2015   Time 4    Period Weeks   Additional Long Term Goals   Additional Long Term Goals Yes   PT LONG TERM GOAL #6   Title The patient will return demo sleeping  positions for pain reduction/management.   Baseline Target date 02/14/2015   Time 4   Period Weeks               Plan - 01/14/15 1317    PT Next Visit Plan sleeping positions, add more HEP: bilateral hip abduction, L LE SLR ( if tolerated), IT band stretching, and neck/postural stability; soft tissue/manual as needed on the neck   Consulted and Agree with Plan of Care Patient         Problem List Patient Active Problem List   Diagnosis Date Noted  . Thyroid nodule 12/25/2014  . ARDS (adult respiratory distress syndrome)   . Hypoxia   . Infiltrate noted on imaging study   . Acute respiratory failure with hypoxia   . Pharyngeal swelling   . Tonsillar abscess 12/06/2014  . Tonsil, abscess 12/06/2014  . Cervical spine arthritis 11/14/2014  . Osteoarthritis of hip 11/14/2014  . Loss of consciousness 11/14/2014  . Cough 05/26/2014  . Musculoskeletal chest and rib pain 03/26/2014  . Rib pain on right side 02/28/2014  . Groin pain 04/18/2013  . Tension headache 12/22/2012  . UNSPECIFIED RENAL SCLEROSIS 01/16/2009  . GERD, SEVERE 05/25/2007  . OBESITY NOS 04/03/2007  . HYPERLIPIDEMIA 08/31/2006  . Major depression, recurrent 08/31/2006  . 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 Former smoker 08/31/2006  . HYPERTENSION, BENIGN SYSTEMIC 08/31/2006  . GASTRIC ULCER ACUTE WITHOUT HEMORRHAGE 08/31/2006  . CONVULSIONS, SEIZURES, NOS 08/31/2006    Thank you for the referral of this patient.  [place], [place] 01/15/2015, 10:55 AM  [facility] [address] [place], [place], [address] Phone: [phone]   Fax:  [phone]

## 2015-01-17 ENCOUNTER — Other Ambulatory Visit: Payer: Self-pay | Admitting: Family Medicine

## 2015-01-17 NOTE — Telephone Encounter (Signed)
Refill was sent to [facility] location

## 2015-01-19 NOTE — Telephone Encounter (Signed)
Patient is no longer on this medication. She is currently on metoprolol that was started by her cardiologist. Please inform her to request a refill of the metoprolol.

## 2015-01-20 ENCOUNTER — Ambulatory Visit: Payer: No Typology Code available for payment source | Admitting: Physical Therapy

## 2015-01-20 ENCOUNTER — Encounter: Payer: Self-pay | Admitting: Physical Therapy

## 2015-01-20 DIAGNOSIS — R531 Weakness: Secondary | ICD-10-CM

## 2015-01-20 DIAGNOSIS — M25552 Pain in left hip: Secondary | ICD-10-CM

## 2015-01-20 DIAGNOSIS — M542 Cervicalgia: Secondary | ICD-10-CM

## 2015-01-20 NOTE — Therapy (Addendum)
[facility] [address] [place], [place], [address] Phone: [phone]   Fax:  [phone]  Physical Therapy Treatment  Patient Details  Name: Kristin [name] MRN: [id number] Date of Birth: 1955/10/17 Referring Provider:  Smiley Houseman, MD  Encounter Date: 01/20/2015      PT End of Session - 01/20/15 0935    Visit Number 2   Number of Visits 8   Date for PT Re-Evaluation 02/14/15   Authorization Type GCCN 03/12/2015 end date with $10 copay   PT Start Time 0934   PT Stop Time 1012   PT Time Calculation (min) 38 min   Activity Tolerance Patient tolerated treatment well   Behavior During Therapy [facility] for tasks assessed/performed      Past Medical History  Diagnosis Date  . Back pain, chronic   . GERD (gastroesophageal reflux disease)   . Obesity   . Tobacco abuse   . Hypertension   . Hyperlipidemia   . Seizures   . Depression   . Somatization disorder   . Renal sclerosis, unspecified     only has 1 kidney    Past Surgical History  Procedure Laterality Date  . Tubal ligation    . Abdominal hysterectomy    . Cholecystectomy    . Tonsillectomy Bilateral 12/06/2014    Procedure: TONSILLECTOMY, BILATERAL;  Surgeon: Ruby Cola, MD;  Location: [facility] OR;  Service: ENT;  Laterality: Bilateral;    There were no vitals filed for this visit.  Visit Diagnosis:  Generalized weakness  Neck pain  Hip pain, left      Subjective Assessment - 01/20/15 0935    Subjective Pt continues to complain of neck pain and L hip tightness. Reports doing HEP and feels that the exercises have helped "some" with neck/hip tightness.   Patient Stated Goals Reduce pain   Currently in Pain? Yes   Pain Score 5    Pain Location Neck   Pain Orientation Left   Pain Descriptors / Indicators Aching;Sharp;Shooting;Throbbing   Pain Type Chronic pain   Pain Radiating Towards L side of face with throbbing (all pain on left side)   Pain Onset More than a month ago   Pain Frequency Constant   Aggravating Factors  turning quickly   Pain Relieving Factors meds reduce, but wear off quickly        Treatment:   Therapeutic Exercise:  -Performed HEP (See Patient Instructions) - cues for technique and ex form; able to perform each exercise independently after cues during 1st set -IT Band Stretch next to counter 3 x 30 sec each side -Attempted L SLR, but unable to perform without pain         PT Education - 01/20/15 1159    Education provided Yes   Education Details HEP: neck/hip stretching and strengthening; sleeping positioning   Person(s) Educated Patient   Methods Explanation;Demonstration;Handout   Comprehension Verbalized understanding;Returned demonstration            PT Long Term Goals - 01/14/15 1328    PT LONG TERM GOAL #1   Title The patient will be indep with HEP for flexibility, postural stabilization, and strengthening.   Baseline Target date 02/14/2015   Time 4   Period Weeks   PT LONG TERM GOAL #2   Title Improve A/ROM neck rotation right from 32 deg up to > or equal to 40 deg, and left from 40 deg up to > or equal to 45 deg.  Baseline Target date 02/14/2015   Time 4   Period Weeks   PT LONG TERM GOAL #3   Title Improve L LE straight leg raise from 3/5 up to 4/5 for improved hip strength/stabilization.   Baseline Target date 02/14/2015   Time 4   Period Weeks   PT LONG TERM GOAL #4   Title The patient will demonstrate decreased bilateral trendelenberg gait per observation (noted with significant pelvic drop each step of gait bilaterally at baseline).   Baseline Target date 02/14/2015   Time 4   Period Weeks   PT LONG TERM GOAL #5   Title Decrease neck disability index from 22% to < or equal to 14%.   Baseline Target date 02/14/2015   Time 4   Period Weeks   Additional Long Term Goals   Additional Long Term Goals Yes   PT LONG TERM GOAL #6   Title The patient will return demo  sleeping positions for pain reduction/management.   Baseline Target date 02/14/2015   Time 4   Period Weeks            Plan - 01/20/15 1203    Clinical Impression Statement Pt able to perform exercises added to HEP safely and independently. Continues to be slightly limited with some exercises (SLR) due to pain. Appears to understand the importance of performing HEP to manage condition. Making progress toward goals.   PT Next Visit Plan add more HEP; bilateral hip abduction, IT band stretching, and neck/postural stability; soft tissue/manual as needed on the neck   Consulted and Agree with Plan of Care Patient        Problem List Patient Active Problem List   Diagnosis Date Noted  . Thyroid nodule 12/25/2014  . ARDS (adult respiratory distress syndrome)   . Hypoxia   . Infiltrate noted on imaging study   . Acute respiratory failure with hypoxia   . Pharyngeal swelling   . Tonsillar abscess 12/06/2014  . Tonsil, abscess 12/06/2014  . Cervical spine arthritis 11/14/2014  . Osteoarthritis of hip 11/14/2014  . Loss of consciousness 11/14/2014  . Cough 05/26/2014  . Musculoskeletal chest and rib pain 03/26/2014  . Rib pain on right side 02/28/2014  . Groin pain 04/18/2013  . Tension headache 12/22/2012  . UNSPECIFIED RENAL SCLEROSIS 01/16/2009  . GERD, SEVERE 05/25/2007  . OBESITY NOS 04/03/2007  . HYPERLIPIDEMIA 08/31/2006  . Major depression, recurrent 08/31/2006  . 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 Former smoker 08/31/2006  . HYPERTENSION, BENIGN SYSTEMIC 08/31/2006  . GASTRIC ULCER ACUTE WITHOUT HEMORRHAGE 08/31/2006  . CONVULSIONS, SEIZURES, NOS 08/31/2006    Gerlean Ren, [place] 01/20/2015, 4:09 PM  [place], SPTA  Pt limitations, deficits, plan and treatment interventions discussed with evaluating PT, Rudell Cobb, after noting they were missing from the evaluation. She is to add them to the initial evaluation and sent new certification with this  information to the referring MD.   Willow Ora, PTA, [place] [address] [place], [place] [address] [phone] 01/21/2015, 3:53 PM   PT and PTA discussed plan of care from initial evaluation.  Plan updated to reflect patient impairments, treatment interventions and frequency/duration.  Resent to MD for authorization. [place], [place] [address] [place], [place]  [address] Phone:  [phone] Fax:  [phone]

## 2015-01-20 NOTE — Patient Instructions (Signed)
Upper Limb Neural Tension: Radial I   Place right arm across low back and turn head down toward left side. Hold for 30 seconds. Repeat 3 times. Repeat with left arm across low back and turn head down toward right side. Do 2-3 sessions per day.    http://orth.exer.us/408   Copyright  VHI. All rights reserved.  Chin Tucks   Take a deep breath and exhale while pushing the back of neck down on the bed. Repeat _10___ times. Do 3 sets. Do _2-3___ sessions per day.  http://[email or web address].exer.us/0   Copyright  VHI. All rights reserved.  Strengthening: Hip Abductor - Resisted   Lying on back with both knees bent, loop band around both legs above knees, push thighs apart. Repeat _10___ times per set. Do __3__ sets per session. Do _2-3___ sessions per day.  http://orth.exer.us/688   Copyright  VHI. All rights reserved.  Flexibility: Corner Stretch   Standing in corner with hands just above shoulder level, lean forward until a comfortable stretch is felt across chest. Hold _30___ seconds. Repeat _3___ times. Do __2-3__ sessions per day.  http://orth.exer.us/343   Copyright  VHI. All rights reserved.

## 2015-01-22 ENCOUNTER — Ambulatory Visit: Payer: No Typology Code available for payment source | Admitting: Physical Therapy

## 2015-01-22 ENCOUNTER — Encounter: Payer: Self-pay | Admitting: Physical Therapy

## 2015-01-22 DIAGNOSIS — M542 Cervicalgia: Secondary | ICD-10-CM

## 2015-01-22 DIAGNOSIS — M25552 Pain in left hip: Secondary | ICD-10-CM

## 2015-01-22 DIAGNOSIS — R531 Weakness: Secondary | ICD-10-CM

## 2015-01-22 NOTE — Patient Instructions (Signed)
Balance, Proprioception: Hip Abduction With Tubing   With tubing attached to ankle of right leg, swing leg to side. Return. Repeat _10___ times. Repeat with left leg. Do 2 sets each side. Do _2-3___ sessions per day.  http://cc.exer.us/20   Copyright  VHI. All rights reserved.

## 2015-01-22 NOTE — Addendum Note (Signed)
Addended by: Rudell Cobb M on: 01/22/2015 10:46 AM   Modules accepted: Orders

## 2015-01-22 NOTE — Therapy (Signed)
[facility] [address] [place], [place], [address] Phone: [phone]   Fax:  [phone]  Physical Therapy Treatment  Patient Details  Name: GWENLYN HOTTINGER MRN: [id number] Date of Birth: [birth year] Referring Provider:  Smiley Houseman, MD  Encounter Date: 01/22/2015      PT End of Session - 01/22/15 1019    Visit Number 3   Number of Visits 8   Date for PT Re-Evaluation 02/14/15   Authorization Type [employer] 03/12/2015 end date with $10 copay   PT Start Time 1017   PT Stop Time 1102   PT Time Calculation (min) 45 min   Activity Tolerance Patient tolerated treatment well   Behavior During Therapy [facility] for tasks assessed/performed      Past Medical History  Diagnosis Date  . Back pain, chronic   . GERD (gastroesophageal reflux disease)   . Obesity   . Tobacco abuse   . Hypertension   . Hyperlipidemia   . Seizures   . Depression   . Somatization disorder   . Renal sclerosis, unspecified     only has 1 kidney    Past Surgical History  Procedure Laterality Date  . Tubal ligation    . Abdominal hysterectomy    . Cholecystectomy    . Tonsillectomy Bilateral 12/06/2014    Procedure: TONSILLECTOMY, BILATERAL;  Surgeon: Ruby Cola, MD;  Location: [facility] OR;  Service: ENT;  Laterality: Bilateral;    There were no vitals filed for this visit.  Visit Diagnosis:  Generalized weakness  Neck pain  Hip pain, left      Subjective Assessment - 01/22/15 1022    Subjective Pt complains of L hip soreness from sleeping on L side last night. Has been doing HEP with no issues. Slight decrease in neck pain.   Currently in Pain? Yes   Pain Score 3    Pain Location Neck   Pain Orientation Left   Pain Descriptors / Indicators Aching;Sharp;Throbbing;Shooting   Pain Type Chronic pain   Pain Onset More than a month ago   Pain Frequency Constant   Aggravating Factors  turning quickly   Pain Relieving Factors meds  reduce, but wear off quickly   Multiple Pain Sites Yes   Pain Score 6   Pain Location Hip   Pain Orientation Left   Pain Descriptors / Indicators Aching;Sore   Pain Type Chronic pain   Pain Onset More than a month ago   Pain Frequency Intermittent   Aggravating Factors  lying down at night on left side       Treatment: Therapeutic Exercise (cues for technique and ex form): -Cervical stabilization exercises in hook lying: alt UE overhead reaching x20, B UE overhead reaching 2x10 -Rolling ex ball up wall with B UE and holding for 10 sec x10 -IT band/hip abductor stretch in long sitting 2x30 sec each side -Standing hip abduction with red Thera-band 2x10 each side        PT Education - 01/22/15 1538    Education provided Yes   Education Details additional exercises added to HEP for hip strengthening   Person(s) Educated Patient   Methods Explanation;Demonstration;Handout   Comprehension Verbalized understanding;Returned demonstration            PT Long Term Goals - 01/14/15 1328    PT LONG TERM GOAL #1   Title The patient will be indep with HEP for flexibility, postural stabilization, and strengthening.   Baseline Target date 02/14/2015  Time 4   Period Weeks   PT LONG TERM GOAL #2   Title Improve A/ROM neck rotation right from 32 deg up to > or equal to 40 deg, and left from 40 deg up to > or equal to 45 deg.   Baseline Target date 02/14/2015   Time 4   Period Weeks   PT LONG TERM GOAL #3   Title Improve L LE straight leg raise from 3/5 up to 4/5 for improved hip strength/stabilization.   Baseline Target date 02/14/2015   Time 4   Period Weeks   PT LONG TERM GOAL #4   Title The patient will demonstrate decreased bilateral trendelenberg gait per observation (noted with significant pelvic drop each step of gait bilaterally at baseline).   Baseline Target date 02/14/2015   Time 4   Period Weeks   PT LONG TERM GOAL #5   Title Decrease neck disability index from 22%  to < or equal to 14%.   Baseline Target date 02/14/2015   Time 4   Period Weeks   Additional Long Term Goals   Additional Long Term Goals Yes   PT LONG TERM GOAL #6   Title The patient will return demo sleeping positions for pain reduction/management.   Baseline Target date 02/14/2015   Time 4   Period Weeks            Plan - 01/22/15 1539    Clinical Impression Statement Pt able to perform exercises added to HEP safely and independently. Continuing to perform exercises at home with no issues. Making progress toward goals.   Pt will benefit from skilled therapeutic intervention in order to improve on the following deficits Impaired flexibility;Postural dysfunction;Pain;Improper body mechanics;Decreased strength;Decreased range of motion;Decreased mobility   Rehab Potential Good   PT Frequency 2x / week   PT Duration 4 weeks   PT Treatment/Interventions Neuromuscular re-education;Patient/family education;Therapeutic activities;Therapeutic exercise;ADLs/Self Care Home Management;Functional mobility training;Moist Heat;Cryotherapy;Manual techniques;Passive range of motion   PT Next Visit Plan add more HEP; bilateral hip abduction, IT band stretching, and neck/postural stability; soft tissue/manual as needed on the neck   Consulted and Agree with Plan of Care Patient        Problem List Patient Active Problem List   Diagnosis Date Noted  . Thyroid nodule 12/25/2014  . ARDS (adult respiratory distress syndrome)   . Hypoxia   . Infiltrate noted on imaging study   . Acute respiratory failure with hypoxia   . Pharyngeal swelling   . Tonsillar abscess 12/06/2014  . Tonsil, abscess 12/06/2014  . Cervical spine arthritis 11/14/2014  . Osteoarthritis of hip 11/14/2014  . Loss of consciousness 11/14/2014  . Cough 05/26/2014  . Musculoskeletal chest and rib pain 03/26/2014  . Rib pain on right side 02/28/2014  . Groin pain 04/18/2013  . Tension headache 12/22/2012  . UNSPECIFIED  RENAL SCLEROSIS 01/16/2009  . GERD, SEVERE 05/25/2007  . OBESITY NOS 04/03/2007  . HYPERLIPIDEMIA 08/31/2006  . Major depression, recurrent 08/31/2006  . 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 Former smoker 08/31/2006  . HYPERTENSION, BENIGN SYSTEMIC 08/31/2006  . GASTRIC ULCER ACUTE WITHOUT HEMORRHAGE 08/31/2006  . CONVULSIONS, SEIZURES, NOS 08/31/2006    Rubye Oaks 01/22/2015, 3:43 PM  [place], [place], [place], [facility] [address] [place], [place] [address] [phone] 01/23/2015, 1:46 PM

## 2015-01-26 ENCOUNTER — Other Ambulatory Visit: Payer: Self-pay | Admitting: Family Medicine

## 2015-01-26 NOTE — Telephone Encounter (Signed)
Please advise refill as Dr. Caryl Bis has changed office's.

## 2015-01-27 ENCOUNTER — Ambulatory Visit: Payer: No Typology Code available for payment source | Admitting: Physical Therapy

## 2015-01-27 ENCOUNTER — Encounter: Payer: Self-pay | Admitting: Physical Therapy

## 2015-01-27 DIAGNOSIS — M542 Cervicalgia: Secondary | ICD-10-CM

## 2015-01-27 DIAGNOSIS — M25552 Pain in left hip: Secondary | ICD-10-CM

## 2015-01-27 DIAGNOSIS — R531 Weakness: Secondary | ICD-10-CM

## 2015-01-27 NOTE — Therapy (Signed)
[facility] [address] [place], [place], [address] Phone: [phone]   Fax:  [phone]  Physical Therapy Treatment  Patient Details  Name: Kristin [name] MRN: [id number] Date of Birth: April 26, 1956 Referring Provider:  Smiley Houseman, MD  Encounter Date: 01/27/2015      PT End of Session - 01/27/15 1406    Visit Number 4   Number of Visits 8   Date for PT Re-Evaluation 02/14/15   Authorization Type GCCN 03/12/2015 end date with $10 copay   PT Start Time 1401   PT Stop Time 1445   PT Time Calculation (min) 44 min   Activity Tolerance Patient tolerated treatment well   Behavior During Therapy [facility] for tasks assessed/performed      Past Medical History  Diagnosis Date  . Back pain, chronic   . GERD (gastroesophageal reflux disease)   . Obesity   . Tobacco abuse   . Hypertension   . Hyperlipidemia   . Seizures   . Depression   . Somatization disorder   . Renal sclerosis, unspecified     only has 1 kidney    Past Surgical History  Procedure Laterality Date  . Tubal ligation    . Abdominal hysterectomy    . Cholecystectomy    . Tonsillectomy Bilateral 12/06/2014    Procedure: TONSILLECTOMY, BILATERAL;  Surgeon: Ruby Cola, MD;  Location: [facility] OR;  Service: ENT;  Laterality: Bilateral;    There were no vitals filed for this visit.  Visit Diagnosis:  Generalized weakness  Neck pain  Hip pain, left      Subjective Assessment - 01/27/15 1404    Subjective No new complaints. Reports she did feel a little tightness in her buttocks after last session. No falls.   Currently in Pain? Yes   Pain Score 4    Pain Location Neck   Pain Orientation Left   Pain Descriptors / Indicators Sore;Aching   Pain Type Chronic pain   Pain Onset More than a month ago   Pain Frequency Constant   Aggravating Factors  quick movements   Pain Relieving Factors ice or heat, ibuprophen/tylenol arthritis medication  rotation      Treatment: Exercises On mat: Cervical nods x 10 reps Cervical shakes x 10 reps\ Double knee to chest stretch 20 sec hold x 3 reps Piriformis stretch 20 sec hold x 3 reps each side   With chin tuck - alternating UE raises with 1# weights x 10 each arm - "V" 1# weights x 10 reps - "W" 1# weights x 10 reps  Seated edge of mat Posterior shoulder rolls x 10 Upper trap neural stretch 15 sec hold x 3 each way Hamstring stretch 20 sec hold x 3 reps each leg  Standing Alternating fwd lunges with simultaneous OH should flexion with 1# weights x 10 reps each side Alternating side stepping with simultaneous shoulder horizontal abduction with red theraband x 10 reps to each side Alternating side lunges with simultaneous angel wings with 1# weights x 10 reps to each side Red ball at wall: Rolling up/down wall with flexion stretch at top x 10 reps Small circles with emphasis on scapular stability x 10 each way Pushups x 10 reps with emphasis on scapular stability   Scifit x 4 extremities level 3.0 with goal rpm >/= 60 x 8 minutes for strengthening and activity tolerance.        PT Long Term Goals - 01/14/15 1328    PT  LONG TERM GOAL #1   Title The patient will be indep with HEP for flexibility, postural stabilization, and strengthening.   Baseline Target date 02/14/2015   Time 4   Period Weeks   PT LONG TERM GOAL #2   Title Improve A/ROM neck rotation right from 32 deg up to > or equal to 40 deg, and left from 40 deg up to > or equal to 45 deg.   Baseline Target date 02/14/2015   Time 4   Period Weeks   PT LONG TERM GOAL #3   Title Improve L LE straight leg raise from 3/5 up to 4/5 for improved hip strength/stabilization.   Baseline Target date 02/14/2015   Time 4   Period Weeks   PT LONG TERM GOAL #4   Title The patient will demonstrate decreased bilateral trendelenberg gait per observation (noted with significant pelvic drop each step of gait bilaterally at  baseline).   Baseline Target date 02/14/2015   Time 4   Period Weeks   PT LONG TERM GOAL #5   Title Decrease neck disability index from 22% to < or equal to 14%.   Baseline Target date 02/14/2015   Time 4   Period Weeks   Additional Long Term Goals   Additional Long Term Goals Yes   PT LONG TERM GOAL #6   Title The patient will return demo sleeping positions for pain reduction/management.   Baseline Target date 02/14/2015   Time 4   Period Weeks            Plan - 01/27/15 1406    Clinical Impression Statement Advanced exercises today in clinic with no complaints from pt. Making steady progress toward goals.   Pt will benefit from skilled therapeutic intervention in order to improve on the following deficits Impaired flexibility;Postural dysfunction;Pain;Improper body mechanics;Decreased strength;Decreased range of motion;Decreased mobility   Rehab Potential Good   PT Frequency 2x / week   PT Duration 4 weeks   PT Treatment/Interventions Neuromuscular re-education;Patient/family education;Therapeutic activities;Therapeutic exercise;ADLs/Self Care Home Management;Functional mobility training;Moist Heat;Cryotherapy;Manual techniques;Passive range of motion   PT Next Visit Plan add more HEP; bilateral hip abduction, IT band stretching, and neck/postural stability; soft tissue/manual as needed on the neck   Consulted and Agree with Plan of Care Patient        Problem List Patient Active Problem List   Diagnosis Date Noted  . Thyroid nodule 12/25/2014  . ARDS (adult respiratory distress syndrome)   . Hypoxia   . Infiltrate noted on imaging study   . Acute respiratory failure with hypoxia   . Pharyngeal swelling   . Tonsillar abscess 12/06/2014  . Tonsil, abscess 12/06/2014  . Cervical spine arthritis 11/14/2014  . Osteoarthritis of hip 11/14/2014  . Loss of consciousness 11/14/2014  . Cough 05/26/2014  . Musculoskeletal chest and rib pain 03/26/2014  . Rib pain on right  side 02/28/2014  . Groin pain 04/18/2013  . Tension headache 12/22/2012  . UNSPECIFIED RENAL SCLEROSIS 01/16/2009  . GERD, SEVERE 05/25/2007  . OBESITY NOS 04/03/2007  . HYPERLIPIDEMIA 08/31/2006  . Major depression, recurrent 08/31/2006  . 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 Former smoker 08/31/2006  . HYPERTENSION, BENIGN SYSTEMIC 08/31/2006  . GASTRIC ULCER ACUTE WITHOUT HEMORRHAGE 08/31/2006  . CONVULSIONS, SEIZURES, NOS 08/31/2006    Willow Ora 01/28/2015, 2:37 PM  Willow Ora, PTA, [facility] [address] [place], [place] [address] [phone] 01/28/2015, 2:37 PM

## 2015-01-29 ENCOUNTER — Encounter: Payer: Self-pay | Admitting: Physical Therapy

## 2015-01-29 ENCOUNTER — Ambulatory Visit: Payer: No Typology Code available for payment source | Admitting: Physical Therapy

## 2015-01-29 DIAGNOSIS — M25552 Pain in left hip: Secondary | ICD-10-CM

## 2015-01-29 DIAGNOSIS — M542 Cervicalgia: Secondary | ICD-10-CM

## 2015-01-29 DIAGNOSIS — R531 Weakness: Secondary | ICD-10-CM

## 2015-01-29 NOTE — Therapy (Signed)
[name] [address] [place], [place], [address] Phone: [phone]   Fax:  [phone]  Physical Therapy Treatment  Patient Details  Name: Kristin [name] MRN: [id number] Date of Birth: 1956/07/03 Referring Provider:  Smiley Houseman, MD  Encounter Date: 01/29/2015      PT End of Session - 01/29/15 1320    Visit Number 5   Number of Visits 8   Date for PT Re-Evaluation 02/14/15   Authorization Type GCCN 03/12/2015 end date with $10 copay   PT Start Time 1316   PT Stop Time 1357   PT Time Calculation (min) 41 min   Activity Tolerance Patient tolerated treatment well   Behavior During Therapy [facility] for tasks assessed/performed      Past Medical History  Diagnosis Date  . Back pain, chronic   . GERD (gastroesophageal reflux disease)   . Obesity   . Tobacco abuse   . Hypertension   . Hyperlipidemia   . Seizures   . Depression   . Somatization disorder   . Renal sclerosis, unspecified     only has 1 kidney    Past Surgical History  Procedure Laterality Date  . Tubal ligation    . Abdominal hysterectomy    . Cholecystectomy    . Tonsillectomy Bilateral 12/06/2014    Procedure: TONSILLECTOMY, BILATERAL;  Surgeon: Ruby Cola, MD;  Location: [facility] OR;  Service: ENT;  Laterality: Bilateral;    There were no vitals filed for this visit.  Visit Diagnosis:  Generalized weakness  Neck pain  Hip pain, left      Subjective Assessment - 01/29/15 1318    Subjective No new complaints. No falls to report. Some soreness after last session.    Currently in Pain? Yes   Pain Score 4   neck; 6/10 in left hip   Pain Location Neck  and hip   Pain Orientation Left   Pain Descriptors / Indicators Aching;Sore   Pain Type Chronic pain   Pain Onset More than a month ago   Pain Frequency Constant   Aggravating Factors  quick movments   Pain Relieving Factors ice or heat, tylenol      Scifit level 3.0 x 4  extremities x 8 minutes with goal rpm >/= 60 for strengthening and activity tolerance.  Red pball at wall Rolling up/down wall with flexion stretch at top x 10 reps Circles x 10 both ways Pushups x 10 reps  At counter: with light to no UE support, 3 laps each  - high knee marches fwd/bwd - toe walk fwd/bwd  - heel walk fwd/bwd - tandem gait fwd/bwd  in parallel bars with yellow band around ankles: Alternating hip abduction, extension and flexion x 10 each bil legs  Seated edge of mat: posterior shoulder rolls x 20 reps Lateral flexion stretch Upper trap neural stretch Hamstring stretch 15 sec hold x 3 each way  Doorway stretch, 10 sec's x 4 reps  Facing wall with towel between forehead and wall, toes to wall: simultaneously bringing arms back off wall and then back to rest on wall x 4 points for scapular mobility and strenghening of lower, middle and upper traps. 4 points as follows: 45 degrees away from lower body, 90 degrees, 15-20 degrees above 90 and the overhead.          PT Long Term Goals - 01/14/15 1328    PT LONG TERM GOAL #1   Title The patient will be indep  with HEP for flexibility, postural stabilization, and strengthening.   Baseline Target date 02/14/2015   Time 4   Period Weeks   PT LONG TERM GOAL #2   Title Improve A/ROM neck rotation right from 32 deg up to > or equal to 40 deg, and left from 40 deg up to > or equal to 45 deg.   Baseline Target date 02/14/2015   Time 4   Period Weeks   PT LONG TERM GOAL #3   Title Improve L LE straight leg raise from 3/5 up to 4/5 for improved hip strength/stabilization.   Baseline Target date 02/14/2015   Time 4   Period Weeks   PT LONG TERM GOAL #4   Title The patient will demonstrate decreased bilateral trendelenberg gait per observation (noted with significant pelvic drop each step of gait bilaterally at baseline).   Baseline Target date 02/14/2015   Time 4   Period Weeks   PT LONG TERM GOAL #5   Title Decrease  neck disability index from 22% to < or equal to 14%.   Baseline Target date 02/14/2015   Time 4   Period Weeks   Additional Long Term Goals   Additional Long Term Goals Yes   PT LONG TERM GOAL #6   Title The patient will return demo sleeping positions for pain reduction/management.   Baseline Target date 02/14/2015   Time 4   Period Weeks          Plan - 01/29/15 1320    Clinical Impression Statement Continued to work on strengthening and flexibility today without any issues reported. Making steady progress toward goals.   Pt will benefit from skilled therapeutic intervention in order to improve on the following deficits Impaired flexibility;Postural dysfunction;Pain;Improper body mechanics;Decreased strength;Decreased range of motion;Decreased mobility   Rehab Potential Good   PT Frequency 2x / week   PT Duration 4 weeks   PT Treatment/Interventions Neuromuscular re-education;Patient/family education;Therapeutic activities;Therapeutic exercise;ADLs/Self Care Home Management;Functional mobility training;Moist Heat;Cryotherapy;Manual techniques;Passive range of motion   PT Next Visit Plan stengthening, stretching of hips, neck and core. soft tissue/manual as needed on the neck   Consulted and Agree with Plan of Care Patient        Problem List Patient Active Problem List   Diagnosis Date Noted  . Thyroid nodule 12/25/2014  . ARDS (adult respiratory distress syndrome)   . Hypoxia   . Infiltrate noted on imaging study   . Acute respiratory failure with hypoxia   . Pharyngeal swelling   . Tonsillar abscess 12/06/2014  . Tonsil, abscess 12/06/2014  . Cervical spine arthritis 11/14/2014  . Osteoarthritis of hip 11/14/2014  . Loss of consciousness 11/14/2014  . Cough 05/26/2014  . Musculoskeletal chest and rib pain 03/26/2014  . Rib pain on right side 02/28/2014  . Groin pain 04/18/2013  . Tension headache 12/22/2012  . UNSPECIFIED RENAL SCLEROSIS 01/16/2009  . GERD, SEVERE  05/25/2007  . OBESITY NOS 04/03/2007  . HYPERLIPIDEMIA 08/31/2006  . Major depression, recurrent 08/31/2006  . 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 Former smoker 08/31/2006  . HYPERTENSION, BENIGN SYSTEMIC 08/31/2006  . GASTRIC ULCER ACUTE WITHOUT HEMORRHAGE 08/31/2006  . CONVULSIONS, SEIZURES, NOS 08/31/2006    Willow Ora 01/29/2015, 2:03 PM  Willow Ora, PTA, [facility] [address] [place], [place] [address] [phone] 01/29/2015, 2:03 PM

## 2015-02-03 ENCOUNTER — Ambulatory Visit
Payer: No Typology Code available for payment source | Attending: Family Medicine | Admitting: Rehabilitative and Restorative Service Providers"

## 2015-02-03 DIAGNOSIS — M25552 Pain in left hip: Secondary | ICD-10-CM | POA: Insufficient documentation

## 2015-02-03 DIAGNOSIS — M542 Cervicalgia: Secondary | ICD-10-CM | POA: Insufficient documentation

## 2015-02-03 DIAGNOSIS — R531 Weakness: Secondary | ICD-10-CM

## 2015-02-03 NOTE — Patient Instructions (Signed)
IT Band: Knee Across Body   Lie on back, shoulders on surface. Stretch right knee up and across body. Hold _30__ seconds. Relax. CAN STRAIGHTEN KNEE OUT IF YOU NEED A DEEPER STRETCH.  Repeat _3__ times. Do _2__ times a day. Repeat with other leg.  *use the tennis ball to roll along outer edge of thigh for trigger point massage

## 2015-02-03 NOTE — Therapy (Signed)
[facility] [address] [place], [place], [address] Phone: [phone]   Fax:  [phone]  Physical Therapy Treatment  Patient Details  Name: Kristin [name] MRN: [id number] Date of Birth: [birth year] Referring Provider:  Palma Holter, MD  Encounter Date: 02/03/2015      PT End of Session - 02/03/15 1418    Visit Number 6   Number of Visits 8   Date for PT Re-Evaluation 02/14/15   Authorization Type GCCN 03/12/2015 end date with $10 copay   PT Start Time 0850   PT Stop Time 0932   PT Time Calculation (min) 42 min   Activity Tolerance Patient tolerated treatment well   Behavior During Therapy [facility] for tasks assessed/performed      Past Medical History  Diagnosis Date  . Back pain, chronic   . GERD (gastroesophageal reflux disease)   . Obesity   . Tobacco abuse   . Hypertension   . Hyperlipidemia   . Seizures   . Depression   . Somatization disorder   . Renal sclerosis, unspecified     only has 1 kidney    Past Surgical History  Procedure Laterality Date  . Tubal ligation    . Abdominal hysterectomy    . Cholecystectomy    . Tonsillectomy Bilateral 12/06/2014    Procedure: TONSILLECTOMY, BILATERAL;  Surgeon: Melvenia Beam, MD;  Location: [facility] OR;  Service: ENT;  Laterality: Bilateral;    There were no vitals filed for this visit.  Visit Diagnosis:  Generalized weakness  Neck pain  Hip pain, left      Subjective Assessment - 02/03/15 0851    Subjective Pt reports stiff this morning.  She still has difficulty sleeping (legs ache in distal LEs, described as throbbing and worse on L hip).     Currently in Pain? Yes   Pain Score 6   in legs, neck 7/10   Pain Location Neck   Pain Orientation Left   Pain Descriptors / Indicators Aching;Sore   Pain Type Chronic pain   Pain Radiating Towards L side of head   Pain Onset More than a month ago   Pain Frequency Constant   Aggravating Factors   "comes out of nowhere", no meds in system   Pain Relieving Factors ice, meds     THERAPEUTIC EXERCISE: R rotation A/ROM is 54 deg, L rotation is 57 deg R sidebending A/ROM is 38 deg, L sidebending is 40 deg  Supine trunk rotation Supine IT band stretch with trunk rotation and LE/knee flexion/extension IT band stretch in sidelying with bolster rolled over IT band for greater stretch  Standing "W" postural strengthening x 10 reps Standing scapular retraction with blue theraband x 10 reps    SELF CARE/HOME MANAGEMENT: Demonstrated and had patient demo sleeping positions for support in R and L sidelying and supine.  Discussed leg positioning with pillows, neck and UE support. Recommended community exercise post d/c and discussed options. With discussion, feel that patient would benefit from information for AHOY classes. Discussed self mgmt of pain at home and patient given tennis ball for trigger point release of IT band and parascapular musculature        PT Education - 02/03/15 0927    Education provided Yes   Education Details d/c first IT band stretch, added new IT band in supine with rotation LEs, education on using tennis ball for trigger point release   Person(s) Educated Patient   Methods Explanation;Demonstration;Handout  Comprehension Returned demonstration;Verbalized understanding             PT Long Term Goals - 02/03/15 1418    PT LONG TERM GOAL #1   Title The patient will be indep with HEP for flexibility, postural stabilization, and strengthening.   Baseline Target date 02/14/2015   Time 4   Period Weeks   Status On-going   PT LONG TERM GOAL #2   Title Improve A/ROM neck rotation right from 32 deg up to > or equal to 40 deg, and left from 40 deg up to > or equal to 45 deg.   Baseline Met on 02/03/2015 with R rotation 54 degrees and L rotation 57 degrees.   Time 4   Period Weeks   Status Achieved   PT LONG TERM GOAL #3   Title Improve L LE straight leg  raise from 3/5 up to 4/5 for improved hip strength/stabilization.   Baseline Target date 02/14/2015   Time 4   Period Weeks   Status On-going   PT LONG TERM GOAL #4   Title The patient will demonstrate decreased bilateral trendelenberg gait per observation (noted with significant pelvic drop each step of gait bilaterally at baseline).   Baseline Target date 02/14/2015   Time 4   Period Weeks   Status On-going   PT LONG TERM GOAL #5   Title Decrease neck disability index from 22% to < or equal to 14%.   Baseline Target date 02/14/2015   Time 4   Period Weeks   Status On-going   PT LONG TERM GOAL #6   Title The patient will return demo sleeping positions for pain reduction/management.   Baseline Met on 02/03/2015   Time 4   Period Weeks   Status Achieved               Plan - 02/03/15 1420    Clinical Impression Statement The patient feels improvement in legs with stretching.  Patient met 2 LTGs (sleeping positions and neck ROM) today and PT to continue to progress to other goals.  Patient reported 5/10 pain in legs and 6/10 in neck at end of session.   PT Next Visit Plan give info on community center near her for [facility] classes in preparation for d/c.  Plan to wrap up at end of plan of care by 02/14/2015-- work towards remaining LTGs.    Consulted and Agree with Plan of Care Patient        Problem List Patient Active Problem List   Diagnosis Date Noted  . Thyroid nodule 12/25/2014  . ARDS (adult respiratory distress syndrome)   . Hypoxia   . Infiltrate noted on imaging study   . Acute respiratory failure with hypoxia   . Pharyngeal swelling   . Tonsillar abscess 12/06/2014  . Tonsil, abscess 12/06/2014  . Cervical spine arthritis 11/14/2014  . Osteoarthritis of hip 11/14/2014  . Loss of consciousness 11/14/2014  . Cough 05/26/2014  . Musculoskeletal chest and rib pain 03/26/2014  . Rib pain on right side 02/28/2014  . Groin pain 04/18/2013  . Tension headache  12/22/2012  . UNSPECIFIED RENAL SCLEROSIS 01/16/2009  . GERD, SEVERE 05/25/2007  . OBESITY NOS 04/03/2007  . HYPERLIPIDEMIA 08/31/2006  . Major depression, recurrent 08/31/2006  . 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 Former smoker 08/31/2006  . HYPERTENSION, BENIGN SYSTEMIC 08/31/2006  . GASTRIC ULCER ACUTE WITHOUT HEMORRHAGE 08/31/2006  . CONVULSIONS, SEIZURES, NOS 08/31/2006    Aveen Stansel, PT 02/03/2015, 2:27 PM  [facility] [address] [place], [place], [address] Phone: [phone]   Fax:  [phone]

## 2015-02-05 ENCOUNTER — Ambulatory Visit: Payer: No Typology Code available for payment source | Admitting: Physical Therapy

## 2015-02-05 ENCOUNTER — Encounter: Payer: Self-pay | Admitting: Physical Therapy

## 2015-02-05 DIAGNOSIS — R531 Weakness: Secondary | ICD-10-CM

## 2015-02-05 DIAGNOSIS — M542 Cervicalgia: Secondary | ICD-10-CM

## 2015-02-05 DIAGNOSIS — M25552 Pain in left hip: Secondary | ICD-10-CM

## 2015-02-05 NOTE — Patient Instructions (Signed)
[place] [facility] [place] [facility] offers free low-impact aerobics classes for seniors age 58 and better called AHOY. The classes include warm up, aerobics, cool down, and strength training with hand weights and/or exercise bands. The workouts can be modified by you to fit your abilities.   Classes are conveniently located at seven different recreation centers throughout the city. Classes are ongoing throughout the year with the exception of holidays and revised summer schedules. Pre-registration is not required. On your first visit, let the instructor know you are new and you will be given a registration/health form to complete.  If you cannot attend class in person, you can still exercise at home. Video taped versions of AHOY classes are shown on [employer] (GT[employer]) at 8 am and 1 pm Mondays through Fridays. You can also purchase a copy of the AHOY DVD by calling [phone]. AHOY Summer Schedule June 20 to August 19 [facility] [place] Wed., Thurs.*, Sat. Wed.**, Thurs.** 9:15 am  5:30 pm  [facility] [address] Emmie Niemann. 9:15 am  [facility] [place]. Mon., Wed., Fri. 10:30 am  [place] Tues., Thurs. 9:15 am  [facility] [address] Tues., Thurs. 10:30 am  [facility]  [place] Tues., Thurs. 10:30 am  [facility]  [place] Sat. 10:30 am  [facility]  [address]  [place] Thurs. 2:15 pm  * AHOY "Cardio Sculpt" class is high energy, high intensity and focuses on interval training. ** AHOY "[employer]" has intervals of cardio and strength training for a total body workout.

## 2015-02-05 NOTE — Therapy (Signed)
[facility] [address] [place], [place], [address] Phone: [phone]   Fax:  [phone]  Physical Therapy Treatment  Patient Details  Name: Kristin [name] MRN: [id number] Date of Birth: Nov 26, 1955 Referring Provider:  Smiley Houseman, MD  Encounter Date: 02/05/2015   02/05/15 1104  PT Visits / Re-Eval  Visit Number 7  Number of Visits 8  Date for PT Re-Evaluation 02/14/15  Authorization  Authorization Type GCCN 03/12/2015 end date with $10 copay  PT Time Calculation  PT Start Time 1100  PT Stop Time 1145  PT Time Calculation (min) 45 min  PT - End of Session  Activity Tolerance Patient tolerated treatment well  Behavior During Therapy [facility] for tasks assessed/performed     Past Medical History  Diagnosis Date  . Back pain, chronic   . GERD (gastroesophageal reflux disease)   . Obesity   . Tobacco abuse   . Hypertension   . Hyperlipidemia   . Seizures   . Depression   . Somatization disorder   . Renal sclerosis, unspecified     only has 1 kidney    Past Surgical History  Procedure Laterality Date  . Tubal ligation    . Abdominal hysterectomy    . Cholecystectomy    . Tonsillectomy Bilateral 12/06/2014    Procedure: TONSILLECTOMY, BILATERAL;  Surgeon: Ruby Cola, MD;  Location: [facility];  Service: ENT;  Laterality: Bilateral;    02/05/15 1102  Symptoms/Limitations  Subjective Reports feeling really sore after last session.   Pain Assessment  Currently in Pain? Yes  Pain Score 6 (in both legs and neck)  Pain Location Leg (and neck)  Pain Orientation Right;Left;Posterior  Pain Descriptors / Indicators Aching;Sore  Pain Type Chronic pain  Pain Radiating Towards left side of head  Pain Onset More than a month ago  Pain Frequency Intermittent  Aggravating Factors  unsure, it comes on randomly  Pain Relieving Factors medication, stretching, ice    There were no vitals filed for this  visit.  Visit Diagnosis:  Generalized weakness  Neck pain  Hip pain, left   Treatment: Exercises:  Hook lying lower trunk rotation with knee extension in rotation for IT band stretch, hold 10 seconds, 10 times each way. Hook lying with green band around/above knees, hip abduction/ER ,5 sec hold x 10 reps Hook lying with feet apart, green band around/above knees with knees apart. Hold the knee position with bridging, 5 sec x 10 reps   Seated edge of mat: Posterior shoulder rolls x 10 reps Scapular retraction 5 sec holds x 10 reps Upper trap neural stretch, 15 sec holds, x 3 each side  Standing: with green Thera band. Cues on posture and ex form. Shoulder retractions/rows, 5 sec hold x 10 reps Shoulder extension, 5 sec hold, x 10 reps        PT Long Term Goals - 02/03/15 1418    PT LONG TERM GOAL #1   Title The patient will be indep with HEP for flexibility, postural stabilization, and strengthening.   Baseline Target date 02/14/2015   Time 4   Period Weeks   Status On-going   PT LONG TERM GOAL #2   Title Improve A/ROM neck rotation right from 32 deg up to > or equal to 40 deg, and left from 40 deg up to > or equal to 45 deg.   Baseline Met on 02/03/2015 with R rotation 54 degrees and L rotation 57 degrees.   Time  4   Period Weeks   Status Achieved   PT LONG TERM GOAL #3   Title Improve L LE straight leg raise from 3/5 up to 4/5 for improved hip strength/stabilization.   Baseline Target date 02/14/2015   Time 4   Period Weeks   Status On-going   PT LONG TERM GOAL #4   Title The patient will demonstrate decreased bilateral trendelenberg gait per observation (noted with significant pelvic drop each step of gait bilaterally at baseline).   Baseline Target date 02/14/2015   Time 4   Period Weeks   Status On-going   PT LONG TERM GOAL #5   Title Decrease neck disability index from 22% to < or equal to 14%.   Baseline Target date 02/14/2015   Time 4   Period Weeks   Status  On-going   PT LONG TERM GOAL #6   Title The patient will return demo sleeping positions for pain reduction/management.   Baseline Met on 02/03/2015   Time 4   Period Weeks   Status Achieved        02/05/15 1105  Plan  Clinical Impression Statement Continued with strengthening and stretching today with pt reporting less pain at the end of the session, decreased to 4/10. Pt making steady progress toward goals.  Pt will benefit from skilled therapeutic intervention in order to improve on the following deficits Impaired flexibility;Postural dysfunction;Pain;Improper body mechanics;Decreased strength;Decreased range of motion;Decreased mobility  Rehab Potential Good  PT Frequency 2x / week  PT Duration 4 weeks  PT Treatment/Interventions Neuromuscular re-education;Patient/family education;Therapeutic activities;Therapeutic exercise;ADLs/Self Care Home Management;Functional mobility training;Moist Heat;Cryotherapy;Manual techniques;Passive range of motion  Consulted and Agree with Plan of Care Patient    Problem List Patient Active Problem List   Diagnosis Date Noted  . Thyroid nodule 12/25/2014  . ARDS (adult respiratory distress syndrome)   . Hypoxia   . Infiltrate noted on imaging study   . Acute respiratory failure with hypoxia   . Pharyngeal swelling   . Tonsillar abscess 12/06/2014  . Tonsil, abscess 12/06/2014  . Cervical spine arthritis 11/14/2014  . Osteoarthritis of hip 11/14/2014  . Loss of consciousness 11/14/2014  . Cough 05/26/2014  . Musculoskeletal chest and rib pain 03/26/2014  . Rib pain on right side 02/28/2014  . Groin pain 04/18/2013  . Tension headache 12/22/2012  . UNSPECIFIED RENAL SCLEROSIS 01/16/2009  . GERD, SEVERE 05/25/2007  . OBESITY NOS 04/03/2007  . HYPERLIPIDEMIA 08/31/2006  . Major depression, recurrent 08/31/2006  . 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 Former smoker 08/31/2006  . HYPERTENSION, BENIGN SYSTEMIC 08/31/2006  . GASTRIC ULCER ACUTE  WITHOUT HEMORRHAGE 08/31/2006  . CONVULSIONS, SEIZURES, NOS 08/31/2006    Willow Ora 02/06/2015, 8:01 PM  Willow Ora, PTA, [place] [address] [name], [place] [address] [phone] 02/06/2015, 8:01 PM

## 2015-02-06 ENCOUNTER — Ambulatory Visit (HOSPITAL_BASED_OUTPATIENT_CLINIC_OR_DEPARTMENT_OTHER): Payer: No Typology Code available for payment source

## 2015-02-10 ENCOUNTER — Ambulatory Visit: Payer: No Typology Code available for payment source | Admitting: Rehabilitative and Restorative Service Providers"

## 2015-02-10 DIAGNOSIS — M542 Cervicalgia: Secondary | ICD-10-CM

## 2015-02-10 DIAGNOSIS — M25552 Pain in left hip: Secondary | ICD-10-CM

## 2015-02-10 DIAGNOSIS — R531 Weakness: Secondary | ICD-10-CM

## 2015-02-10 NOTE — Therapy (Signed)
[facility] [address] [place], [place], [address] Phone: [phone]   Fax:  [phone]  Physical Therapy Treatment  Patient Details  Name: Kristin [name] MRN: [id number] Date of Birth: 05/27/1956 Referring Provider:  Palma Holter, MD  Encounter Date: 02/10/2015      PT End of Session - 02/10/15 1211    Visit Number 8   Number of Visits 9  modified to reflect eval + 2x/week x 4 weeks   Date for PT Re-Evaluation 02/14/15   Authorization Type GCCN 03/12/2015 end date with $10 copay   PT Start Time 0850   PT Stop Time 0930   PT Time Calculation (min) 40 min   Activity Tolerance Patient tolerated treatment well   Behavior During Therapy [facility] for tasks assessed/performed      Past Medical History  Diagnosis Date  . Back pain, chronic   . GERD (gastroesophageal reflux disease)   . Obesity   . Tobacco abuse   . Hypertension   . Hyperlipidemia   . Seizures   . Depression   . Somatization disorder   . Renal sclerosis, unspecified     only has 1 kidney    Past Surgical History  Procedure Laterality Date  . Tubal ligation    . Abdominal hysterectomy    . Cholecystectomy    . Tonsillectomy Bilateral 12/06/2014    Procedure: TONSILLECTOMY, BILATERAL;  Surgeon: Melvenia Beam, MD;  Location: [facility] OR;  Service: ENT;  Laterality: Bilateral;    There were no vitals filed for this visit.  Visit Diagnosis:  Generalized weakness  Neck pain  Hip pain, left      Subjective Assessment - 02/10/15 0856    Subjective The patient reports L hip and calf muscles are still sore at times.   Patient Stated Goals Reduce pain   Currently in Pain? Yes   Pain Score 6   legs and neck   Pain Location Leg  and neck   Pain Orientation Right;Left;Posterior   Pain Descriptors / Indicators Aching;Sore   Pain Type Chronic pain   Pain Onset More than a month ago   Pain Frequency Intermittent   Aggravating Factors  was  on feet in kitchen Sunday and noted increased pain   Pain Relieving Factors rest, stretching, medication      THERAPEUTIC EXERCISE: Supine trunk rotation R and L sides Standing heel cord stretch R and L sides Supine IT band stretch with trunk rotation Sidelying hip abduction with IT band stretch Seated neck A/ROM and postural stretching  SELF CARE/HOME MANAGEMENT: Discussed sleeping positions and modified slightly adding towel under trunk to maintain improved spinal alignment Recommended ice massage for home vs. Heat to decrease inflammation and for pain mgmt L hip  Gait: Ambulation working on core stability and hip stabilization x 300 ft. Patient continues with bilateral trendelenberg gait.      PT Education - 02/10/15 1208    Education provided Yes   Education Details Added heel cord stretch, ice massage instruction, modified home sleeping position based on patient needs   Person(s) Educated Patient   Methods Explanation;Demonstration;Handout   Comprehension Verbalized understanding;Returned demonstration             PT Long Term Goals - 02/10/15 0904    PT LONG TERM GOAL #1   Title The patient will be indep with HEP for flexibility, postural stabilization, and strengthening.   Baseline Target date 02/14/2015   Time 4   Period Weeks  Status On-going   PT LONG TERM GOAL #2   Title Improve A/ROM neck rotation right from 32 deg up to > or equal to 40 deg, and left from 40 deg up to > or equal to 45 deg.   Baseline Met on 02/03/2015 with R rotation 54 degrees and L rotation 57 degrees.   Time 4   Period Weeks   Status Achieved   PT LONG TERM GOAL #3   Title Improve L LE straight leg raise from 3/5 up to 4/5 for improved hip strength/stabilization.   Baseline on 02/10/2015, the patient is able to hold against minimal resistance (3+/5), she gives way with resistance reporting L hip pain with MMT   Time 4   Period Weeks   Status Partially Met   PT LONG TERM GOAL #4    Title The patient will demonstrate decreased bilateral trendelenberg gait per observation (noted with significant pelvic drop each step of gait bilaterally at baseline).   Baseline Target date 02/14/2015   Time 4   Period Weeks   Status Not Met   PT LONG TERM GOAL #5   Title Decrease neck disability index from 22% to < or equal to 14%.   Baseline Target date 02/14/2015   Time 4   Period Weeks   Status On-going   PT LONG TERM GOAL #6   Title The patient will return demo sleeping positions for pain reduction/management.   Baseline Met on 02/03/2015   Time 4   Period Weeks   Status Achieved               Plan - 02/10/15 1213    Clinical Impression Statement The patient met LTG for sleeping positions, neck ROM, and partially  met goal for SLR.  Patient continues with chronic pain and PT working towards patient managing through HEP and community wellness activities.  Patient to be d/c at next visit.   PT Next Visit Plan Review HEP for d/c, have patient do FOTO for NDI goal, discuss community carryover post d/c.   Consulted and Agree with Plan of Care Patient        Problem List Patient Active Problem List   Diagnosis Date Noted  . Thyroid nodule 12/25/2014  . ARDS (adult respiratory distress syndrome)   . Hypoxia   . Infiltrate noted on imaging study   . Acute respiratory failure with hypoxia   . Pharyngeal swelling   . Tonsillar abscess 12/06/2014  . Tonsil, abscess 12/06/2014  . Cervical spine arthritis 11/14/2014  . Osteoarthritis of hip 11/14/2014  . Loss of consciousness 11/14/2014  . Cough 05/26/2014  . Musculoskeletal chest and rib pain 03/26/2014  . Rib pain on right side 02/28/2014  . Groin pain 04/18/2013  . Tension headache 12/22/2012  . UNSPECIFIED RENAL SCLEROSIS 01/16/2009  . GERD, SEVERE 05/25/2007  . OBESITY NOS 04/03/2007  . HYPERLIPIDEMIA 08/31/2006  . Major depression, recurrent 08/31/2006  . 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 Former smoker  08/31/2006  . HYPERTENSION, BENIGN SYSTEMIC 08/31/2006  . GASTRIC ULCER ACUTE WITHOUT HEMORRHAGE 08/31/2006  . CONVULSIONS, SEIZURES, NOS 08/31/2006    Lofton Leon, PT 02/10/2015, 12:14 PM  [facility] [address] [place], [place], [address] Phone: [phone]   Fax:  [phone]

## 2015-02-10 NOTE — Patient Instructions (Addendum)
Heel Cord Stretch   Place one leg forward, bent, other leg behind and straight. Lean forward keeping back heel flat. Hold __30__ seconds while counting out loud. Repeat with other leg. Repeat _3___ times. Do __2__ sessions per day.  http://gt2.exer.us/512   Copyright  VHI. All rights reserved.    *Also, try towel folded under stomach when lying on your side for sleeping*  Ice Massage and Cold Packs Home Program  Ice and cold packs are used so that we can return the muscle to it's natural resting state without causing more pain, which can lead to more spasm, etc.  Icing and cold packs are also used to reduce swelling, which can lead to pain and stiffness.  COLD PACKS Cold packs should be placed circumferentially around the swollen area (ie., the wrist, etc.).  A paper towel can be placed over the area before the cold pack is applied.  A towel may be wrapped around the outside of the cold pack to keep the cold in.  The swollen area should be elevated with the cold pack, if possible.  ICE MASSAGE Fill a 4 ounce paper cup three-quarters full and put it in a freezer until it is frozen.  When ready to use, tear off about 1 inch of the cup so that some of the ice is showing while the bottom of the cup can be used to hold onto. Massage the entire muscle area as instructed by your therapist.  You may use circular or up and down strokes, but do not hold the ice in one spot.  Four phases to the ice massage and cold pack application: 1. Cold: which you feel when you first apply the ice. 2. Ache: after a few minutes 3. Burning: after approximately five minutes, it will feel like your skin is burning.  At this point, remove the ice for a minute or so. 4. Numbness: THIS IS THE CRUCIAL PHASE!!! Return the ice or cold pack and massage until all the burning disappears.  This signals the end of cryotherapy.  The entire procedure should take ten to fifteen minutes while using the cold pack.  Do Not  perform the ice massage for more than seven minutes on a small area or more than ten minutes on a large area.  Cryotherapy should be performed after exercises, when edema occurs, or when an area is painful.

## 2015-02-12 ENCOUNTER — Encounter: Payer: Self-pay | Admitting: Physical Therapy

## 2015-02-12 ENCOUNTER — Ambulatory Visit: Payer: No Typology Code available for payment source | Admitting: Physical Therapy

## 2015-02-12 DIAGNOSIS — M542 Cervicalgia: Secondary | ICD-10-CM

## 2015-02-12 DIAGNOSIS — R531 Weakness: Secondary | ICD-10-CM

## 2015-02-12 DIAGNOSIS — M25552 Pain in left hip: Secondary | ICD-10-CM

## 2015-02-12 NOTE — Therapy (Signed)
[facility] [address] [place], [place], [address] Phone: [phone]   Fax:  [phone]  Physical Therapy Treatment  Patient Details  Name: Kristin [name] MRN: [id number] Date of Birth: [birth year] Referring Provider:  Smiley Houseman, MD  Encounter Date: 02/12/2015      PT End of Session - 02/12/15 0939    Visit Number 9   Number of Visits 9  modified to reflect eval + 2x/week x 4 weeks   Date for PT Re-Evaluation 02/14/15   Authorization Type [employer] 03/12/2015 end date with $10 copay   PT Start Time 0935   PT Stop Time 1015   PT Time Calculation (min) 40 min   Activity Tolerance Patient tolerated treatment well   Behavior During Therapy [facility] for tasks assessed/performed      Past Medical History  Diagnosis Date  . Back pain, chronic   . GERD (gastroesophageal reflux disease)   . Obesity   . Tobacco abuse   . Hypertension   . Hyperlipidemia   . Seizures   . Depression   . Somatization disorder   . Renal sclerosis, unspecified     only has 1 kidney    Past Surgical History  Procedure Laterality Date  . Tubal ligation    . Abdominal hysterectomy    . Cholecystectomy    . Tonsillectomy Bilateral 12/06/2014    Procedure: TONSILLECTOMY, BILATERAL;  Surgeon: Ruby Cola, MD;  Location: [facility] OR;  Service: ENT;  Laterality: Bilateral;    There were no vitals filed for this visit.  Visit Diagnosis:  Generalized weakness  Neck pain  Hip pain, left      Subjective Assessment - 02/12/15 0938    Subjective No new complaints. Planning to go by [facility] after session today to register. Woke up with left hip pain, 8/10. None now, took a pain pill. Has not tried ice massage as yet.   Currently in Pain? No/denies   Pain Score 0-No pain     Treatment: Reviewed all exercises issued to date for pt's HEP. Provided cues as needed and advanced them as needed.  - cues for correct technique with upper trap  neural stretch.  - cues on correct technique with supine IT band stretch - advanced supine hip abduction/ER to blue band (currently using green band and reports it's easy) - corner stretch with cues on technique/form. - standing hip abduction with red band x 10 reps each alternating legs. (pt using yellow at home, still challenging. Trialed the red band, no pain however decreased form. Pt to advance to red at home with it's easy to do this with the yellow band x 20 reps each leg with good form). - all other ex's were performed correctly: seated shoulder rolls, cervical AROM all directions.  Pt plans to join the [facility] program for continued community fitness post therapy discharge. Going after session today to sign up.        PT Long Term Goals - 02/12/15 0940    PT LONG TERM GOAL #1   Title The patient will be indep with HEP for flexibility, postural stabilization, and strengthening.   Baseline on 02/12/15.   Status Achieved   PT LONG TERM GOAL #2   Title Improve A/ROM neck rotation right from 32 deg up to > or equal to 40 deg, and left from 40 deg up to > or equal to 45 deg.   Baseline Met on 02/03/2015 with R rotation 54 degrees and L  rotation 57 degrees.   Status Achieved   PT LONG TERM GOAL #3   Title Improve L LE straight leg raise from 3/5 up to 4/5 for improved hip strength/stabilization.   Baseline 02/12/15: pt able to hold against moderate to heavy resistance with minimal left hip pain reported today   Status Achieved   PT LONG TERM GOAL #4   Title The patient will demonstrate decreased bilateral trendelenberg gait per observation (noted with significant pelvic drop each step of gait bilaterally at baseline).   Baseline pt still demo's mild bilateral trendelenberg gait.   Time --   Period --   Status Not Met   PT LONG TERM GOAL #5   Title Decrease neck disability index from 22% to < or equal to 14%.   Baseline 02/12/15: scored 38% today on retesting.   Time --   Period --    Status Not Met   PT LONG TERM GOAL #6   Title The patient will return demo sleeping positions for pain reduction/management.   Baseline Met on 02/03/2015   Status Achieved           Plan - 02/12/15 0939    Clinical Impression Statement Pt. met 4 out of 6 LTG's. Pt agreeable to discharge today. Advanced pt's HEP as able today.   Pt will benefit from skilled therapeutic intervention in order to improve on the following deficits Impaired flexibility;Postural dysfunction;Pain;Improper body mechanics;Decreased strength;Decreased range of motion;Decreased mobility   Rehab Potential Good   PT Frequency 2x / week   PT Duration 4 weeks   PT Treatment/Interventions Neuromuscular re-education;Patient/family education;Therapeutic activities;Therapeutic exercise;ADLs/Self Care Home Management;Functional mobility training;Moist Heat;Cryotherapy;Manual techniques;Passive range of motion   PT Next Visit Plan discharge per PT plan of care.   Consulted and Agree with Plan of Care Patient        Problem List Patient Active Problem List   Diagnosis Date Noted  . Thyroid nodule 12/25/2014  . ARDS (adult respiratory distress syndrome)   . Hypoxia   . Infiltrate noted on imaging study   . Acute respiratory failure with hypoxia   . Pharyngeal swelling   . Tonsillar abscess 12/06/2014  . Tonsil, abscess 12/06/2014  . Cervical spine arthritis 11/14/2014  . Osteoarthritis of hip 11/14/2014  . Loss of consciousness 11/14/2014  . Cough 05/26/2014  . Musculoskeletal chest and rib pain 03/26/2014  . Rib pain on right side 02/28/2014  . Groin pain 04/18/2013  . Tension headache 12/22/2012  . UNSPECIFIED RENAL SCLEROSIS 01/16/2009  . GERD, SEVERE 05/25/2007  . OBESITY NOS 04/03/2007  . HYPERLIPIDEMIA 08/31/2006  . Major depression, recurrent 08/31/2006  . 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 Former smoker 08/31/2006  . HYPERTENSION, BENIGN SYSTEMIC 08/31/2006  . GASTRIC ULCER ACUTE WITHOUT HEMORRHAGE  08/31/2006  . CONVULSIONS, SEIZURES, NOS 08/31/2006    Willow Ora 02/12/2015, 8:22 PM  Willow Ora, PTA, [facility] [address], [place], [place] [address] [phone] 02/12/2015, 8:23 PM

## 2015-03-12 ENCOUNTER — Ambulatory Visit (HOSPITAL_BASED_OUTPATIENT_CLINIC_OR_DEPARTMENT_OTHER): Payer: No Typology Code available for payment source | Attending: Family Medicine

## 2015-03-12 DIAGNOSIS — I493 Ventricular premature depolarization: Secondary | ICD-10-CM | POA: Insufficient documentation

## 2015-03-12 DIAGNOSIS — G4700 Insomnia, unspecified: Secondary | ICD-10-CM | POA: Insufficient documentation

## 2015-03-12 DIAGNOSIS — R0683 Snoring: Secondary | ICD-10-CM | POA: Insufficient documentation

## 2015-03-12 DIAGNOSIS — G4710 Hypersomnia, unspecified: Secondary | ICD-10-CM

## 2015-03-12 DIAGNOSIS — G4733 Obstructive sleep apnea (adult) (pediatric): Secondary | ICD-10-CM | POA: Insufficient documentation

## 2015-03-13 ENCOUNTER — Encounter: Payer: Self-pay | Admitting: Rehabilitative and Restorative Service Providers"

## 2015-03-13 NOTE — Therapy (Signed)
[facility] [address] [place], [place], [address] Phone: [phone]   Fax:  [phone]  Patient Details  Name: Kristin [name] MRN: [id number] Date of Birth: [birth year] Referring Provider:  No ref. provider found  Encounter Date: last encounter 02/12/2015   PHYSICAL THERAPY DISCHARGE SUMMARY  Visits from Start of Care: 9  Current functional level related to goals / functional outcomes:       PT Long Term Goals - 02/12/15 0940    PT LONG TERM GOAL #1   Title The patient will be indep with HEP for flexibility, postural stabilization, and strengthening.   Baseline on 02/12/15.   Status Achieved   PT LONG TERM GOAL #2   Title Improve A/ROM neck rotation right from 32 deg up to > or equal to 40 deg, and left from 40 deg up to > or equal to 45 deg.   Baseline Met on 02/03/2015 with R rotation 54 degrees and L rotation 57 degrees.   Status Achieved   PT LONG TERM GOAL #3   Title Improve L LE straight leg raise from 3/5 up to 4/5 for improved hip strength/stabilization.   Baseline 02/12/15: pt able to hold against moderate to heavy resistance with minimal left hip pain reported today   Status Achieved   PT LONG TERM GOAL #4   Title The patient will demonstrate decreased bilateral trendelenberg gait per observation (noted with significant pelvic drop each step of gait bilaterally at baseline).   Baseline pt still demo's mild bilateral trendelenberg gait.   Time --   Period --   Status Not Met   PT LONG TERM GOAL #5   Title Decrease neck disability index from 22% to < or equal to 14%.   Baseline 02/12/15: scored 38% today on retesting.   Time --   Period --   Status Not Met   PT LONG TERM GOAL #6   Title The patient will return demo sleeping positions for pain reduction/management.   Baseline Met on 02/03/2015   Status Achieved        Remaining deficits: Patient score worsened on neck disability index since eval,  however objective measures improved.  PT emphasized self management of chronic pain issues.   Education / Equipment: HEP, sleeping positions, general wellness.  Plan: Patient agrees to discharge.  Patient goals were partially met. Patient is being discharged due to meeting the stated rehab goals.  ?????        Thank you for the referral of this patient. Rudell Cobb, MPT  Paylin Hailu 03/13/2015, 2:52 PM  [facility] [address] [place], [place], [address] Phone: [phone]   Fax:  [phone]

## 2015-03-15 DIAGNOSIS — G4710 Hypersomnia, unspecified: Secondary | ICD-10-CM

## 2015-03-15 NOTE — Progress Notes (Signed)
   Patient Name: Kristin [name], [name] Date: 03/12/2015 Gender: Female D.O.B: 1955-10-20 Age (years): 78 Referring Provider: Madison Hickman Height (inches): 46 Interpreting Physician: Baird Lyons MD, ABSM Weight (lbs): 230 RPSGT: Joni Reining BMI: 42 MRN: [id number] Neck Size: 15.50 CLINICAL INFORMATION Sleep Study Type: NPSG  Indication for sleep study: OSA  Epworth Sleepiness Score: 7  SLEEP STUDY TECHNIQUE As per the AASM Manual for the Scoring of Sleep and Associated Events v2.3 (April 2016) with a hypopnea requiring 4% desaturations.  The channels recorded and monitored were frontal, central and occipital EEG, electrooculogram (EOG), submentalis EMG (chin), nasal and oral airflow, thoracic and abdominal wall motion, anterior tibialis EMG, snore microphone, electrocardiogram, and pulse oximetry.  MEDICATIONS Patient's medications include: charted for review. Medications self-administered by patient during sleep study : amitriptyline, omeprazole, meloxicam  SLEEP ARCHITECTURE The study was initiated at 10:00:21 PM and ended at 4:58:39 AM.  Sleep onset time was 72.1 minutes and the sleep efficiency was 62.8%. The total sleep time was 262.5 minutes.  Stage REM latency was 56.5 minutes.  The patient spent 9.90% of the night in stage N1 sleep, 53.33% in stage N2 sleep, 20.76% in stage N3 and 16.00% in REM.  Alpha intrusion was absent.  Supine sleep was 27.24%.  RESPIRATORY PARAMETERS The overall apnea/hypopnea index (AHI) was 0.0 per hour. There were 0 total apneas, including 0 obstructive, 0 central and 0 mixed apneas. There were 0 hypopneas and 5 RERAs.  The AHI during Stage REM sleep was 0.0 per hour.  AHI while supine was 0.0 per hour.  The mean oxygen saturation was 94.51%. The minimum SpO2 during sleep was 91.00%.  Moderate snoring was noted during this study.  Awake after sleep onset: 83.7 minutes  CARDIAC DATA   The 2 lead EKG demonstrated sinus  rhythm. The mean heart rate was 89.16 beats per minute. Other EKG findings include: PVCs.  LEG MOVEMENT DATA The total PLMS were 17 with a resulting PLMS index of 3.89. Associated arousal with leg movement index was 0.9 .  IMPRESSIONS No significant obstructive sleep apnea occurred during this study (AHI = 0.0/h). No significant central sleep apnea occurred during this study (CAI = 0.0/h). The patient had minimal or no oxygen desaturation during the study (Min O2 = 91.00%) The patient snored with Moderate to Loud snoring volume. EKG findings include PVCs. Clinically significant periodic limb movements did not occur during sleep. No significant associated arousals. The patient had significant difficulty initiating and maintaining sleep which she related to somatic pains despite meds listed above. Also bathroom x 2.  DIAGNOSIS Primary Snoring (786.09 [R06.83 ICD-10]) Insomnia  RECOMMENDATIONS Avoid alcohol, sedatives and other CNS depressants that may worsen sleep apnea and disrupt normal sleep architecture. Sleep hygiene should be reviewed to assess factors that may improve sleep quality, including pain management if appropriate, and management for insomnia. Weight management and regular exercise should be initiated or continued if appropriate.  Deneise Lever Diplomate, American Board of Sleep Medicine  ELECTRONICALLY SIGNED ON:  03/15/2015, 9:52 AM [place] PH: [phone]   FX: [phone] [place]

## 2015-04-21 ENCOUNTER — Encounter: Payer: Self-pay | Admitting: Internal Medicine

## 2015-04-21 ENCOUNTER — Ambulatory Visit (INDEPENDENT_AMBULATORY_CARE_PROVIDER_SITE_OTHER): Payer: Self-pay | Admitting: Internal Medicine

## 2015-04-21 VITALS — BP 138/90 | HR 78 | Temp 98.2°F | Ht 62.0 in | Wt 240.0 lb

## 2015-04-21 DIAGNOSIS — K219 Gastro-esophageal reflux disease without esophagitis: Secondary | ICD-10-CM

## 2015-04-21 DIAGNOSIS — I10 Essential (primary) hypertension: Secondary | ICD-10-CM

## 2015-04-21 MED ORDER — METOPROLOL TARTRATE 25 MG PO TABS: 25.0000 mg | ORAL_TABLET | Freq: Two times a day (BID) | ORAL | Status: DC

## 2015-04-21 MED ORDER — OMEPRAZOLE 20 MG PO CPDR: 20.0000 mg | DELAYED_RELEASE_CAPSULE | Freq: Every day | ORAL | Status: DC

## 2015-04-21 NOTE — Assessment & Plan Note (Signed)
" >>  ASSESSMENT AND PLAN FOR HYPERTENSION, BENIGN SYSTEMIC WRITTEN ON 04/21/2015  6:52 PM BY Temekia Caskey G, MD  Elevated initially at 154/96. Repeat wnl.  - asked patient to continue on Metoprolol  (prescribed to sinus tachy) - asked patient to check BP regularly and keep log "

## 2015-04-21 NOTE — Progress Notes (Signed)
Patient ID: BRESLIN BURKLOW, female   DOB: 05/03/1956, 59 y.o.   MRN: [id number] Subjective:   CC: follow up BP and med refill  HPI:   HTN: Has been out of her Lopressor for 2-3 months. Denies palpitations, dizziness, chest pain, SOB, blurred vision, orthopnea, LE edema. Notes of chronic headache due to OA of neck/shoulder which is stable. Does not check BP at home.   GERD: Takes Prilosec which controls symptoms   Review of Systems - Per HPI.   PMH, FH, or SH Smoking status: former smoker     Objective:  Physical Exam BP 138/90 mmHg  Pulse 78  Temp(Src) 98.2 F (36.8 C) (Oral)  Ht 5\' 2"  (1.575 m)  Wt 240 lb (108.863 kg)  BMI 43.89 kg/m2 GEN: NAD  HEENT: Atraumatic, normocephalic, neck supple, EOMI, sclera clear  CV: RRR, no murmurs, rubs, or gallops  PULM: CTAB, normal effort SKIN: No rash or cyanosis; warm and well-perfused EXTR: No lower extremity edema or calf tenderness PSYCH: Mood and affect euthymic, normal rate and volume of speech NEURO: Awake, alert, no focal deficits grossly, normal speech    Assessment:     MINDIE RAWDON is a 59 y.o. female with h/o HTN, GERD, Obesity, HLD here for BP follow up and med refills.     Plan:     # See problem list and after visit summary for problem-specific plans.   Follow-up: Follow up for CPE and Pap    Smiley Houseman, MD [place]

## 2015-04-21 NOTE — Assessment & Plan Note (Deleted)
Elevated today at 154/96. Repeat 138/90.  - refilled Metoprolol 25mg  BID (which was initially started for sinus tachy) - asked patient to check BP regularly at drug store and keep log

## 2015-04-21 NOTE — Patient Instructions (Signed)
Thank you for coming in  - Please restart your Metoprolol as prescribed - Please check your blood pressure every few days by going to the drug store near your house; please keep a log of this - Please make an appointment in about 2 weeks for Blood Pressure - Also make an appointment for well woman visit so we can do a pap smear - Here is the contact information for you to make a appointment for a mammogram

## 2015-04-21 NOTE — Assessment & Plan Note (Signed)
Elevated initially at 154/96. Repeat wnl.  - asked patient to continue on Metoprolol (prescribed to sinus tachy) - asked patient to check BP regularly and keep log

## 2015-05-06 ENCOUNTER — Encounter: Payer: No Typology Code available for payment source | Admitting: Internal Medicine

## 2015-06-12 LAB — POCT GLUCOSE (2 HR PP): GLUCOSE 2 HR PP, POC: 104 mg/dL

## 2015-11-24 ENCOUNTER — Ambulatory Visit: Payer: No Typology Code available for payment source

## 2015-12-09 ENCOUNTER — Other Ambulatory Visit: Payer: Self-pay | Admitting: Internal Medicine

## 2016-01-07 ENCOUNTER — Ambulatory Visit (INDEPENDENT_AMBULATORY_CARE_PROVIDER_SITE_OTHER): Payer: No Typology Code available for payment source | Admitting: Internal Medicine

## 2016-01-07 ENCOUNTER — Encounter: Payer: Self-pay | Admitting: Internal Medicine

## 2016-01-07 VITALS — BP 159/102 | HR 113 | Temp 98.3°F | Ht 62.0 in | Wt 255.0 lb

## 2016-01-07 DIAGNOSIS — Z0189 Encounter for other specified special examinations: Secondary | ICD-10-CM

## 2016-01-07 DIAGNOSIS — E785 Hyperlipidemia, unspecified: Secondary | ICD-10-CM

## 2016-01-07 DIAGNOSIS — D649 Anemia, unspecified: Secondary | ICD-10-CM

## 2016-01-07 DIAGNOSIS — Z1211 Encounter for screening for malignant neoplasm of colon: Secondary | ICD-10-CM

## 2016-01-07 DIAGNOSIS — I10 Essential (primary) hypertension: Secondary | ICD-10-CM

## 2016-01-07 DIAGNOSIS — K219 Gastro-esophageal reflux disease without esophagitis: Secondary | ICD-10-CM

## 2016-01-07 DIAGNOSIS — Z0000 Encounter for general adult medical examination without abnormal findings: Secondary | ICD-10-CM

## 2016-01-07 DIAGNOSIS — Z1212 Encounter for screening for malignant neoplasm of rectum: Secondary | ICD-10-CM

## 2016-01-07 DIAGNOSIS — R7309 Other abnormal glucose: Secondary | ICD-10-CM

## 2016-01-07 LAB — CBC
HCT: 39.4 % (ref 35.0–45.0)
HEMOGLOBIN: 13 g/dL (ref 11.7–15.5)
MCH: 29 pg (ref 27.0–33.0)
MCHC: 33 g/dL (ref 32.0–36.0)
MCV: 87.9 fL (ref 80.0–100.0)
MPV: 9.5 fL (ref 7.5–12.5)
Platelets: 309 10*3/uL (ref 140–400)
RBC: 4.48 MIL/uL (ref 3.80–5.10)
RDW: 13.4 % (ref 11.0–15.0)
WBC: 5.2 10*3/uL (ref 3.8–10.8)

## 2016-01-07 LAB — COMPLETE METABOLIC PANEL WITH GFR
ALBUMIN: 4.4 g/dL (ref 3.6–5.1)
ALK PHOS: 134 U/L — AB (ref 33–130)
ALT: 15 U/L (ref 6–29)
AST: 16 U/L (ref 10–35)
BILIRUBIN TOTAL: 0.4 mg/dL (ref 0.2–1.2)
BUN: 9 mg/dL (ref 7–25)
CO2: 29 mmol/L (ref 20–31)
CREATININE: 0.79 mg/dL (ref 0.50–1.05)
Calcium: 9.5 mg/dL (ref 8.6–10.4)
Chloride: 101 mmol/L (ref 98–110)
GFR, EST NON AFRICAN AMERICAN: 83 mL/min (ref >=60)
GFR, Est African American: >89 mL/min (ref >=60)
GLUCOSE: 92 mg/dL (ref 65–99)
Potassium: 4 mmol/L (ref 3.5–5.3)
SODIUM: 138 mmol/L (ref 135–146)
TOTAL PROTEIN: 7.1 g/dL (ref 6.1–8.1)

## 2016-01-07 LAB — POCT GLYCOSYLATED HEMOGLOBIN (HGB A1C): HEMOGLOBIN A1C: 6.4

## 2016-01-07 MED ORDER — OMEPRAZOLE 20 MG PO CPDR: 20.0000 mg | DELAYED_RELEASE_CAPSULE | Freq: Every day | ORAL | Status: DC

## 2016-01-07 MED ORDER — CYCLOBENZAPRINE HCL 10 MG PO TABS: 10.0000 mg | ORAL_TABLET | Freq: Three times a day (TID) | ORAL | Status: DC | PRN

## 2016-01-07 NOTE — Patient Instructions (Addendum)
-   please take your Metoprolol regularly  - please check your blood pressure daily or every other day - please make a follow up visit in 1 week - please visit [email or web address] for information on healthy eating

## 2016-01-07 NOTE — Progress Notes (Signed)
Date of Visit: 01/07/2016   HPI:  Patient presents today for a well woman exam.   Concerns: would like refill of Flexeril and Prilosec; she has not been taking Prilosec "for a while"  Periods: total abdominal hysterectomy due to fibroids in 2003 Contraception: none Pelvic symptoms: none  Sexual activity: none STD Screening: no concerns  Pap smear status: total abdominal hysterectomy due to fibroids in 2003 Exercise: no exercise regularly because of chronic hip pain;  Diet: "not good", no fast food, cooks at home but fries most things; eats vegetables, no whole grains; drinks water, no soft drinks. Does not eat regularly and sometimes skips meals. Reports of polydypsia "for months" and polyuria that is affecting sleep.  Smoking: former smoker; 1 pack every 3 days for 40 years Alcohol: no  Drugs: history of marijuana use Mood: PHQ-2 positive; reports feels like her medication dose needs to be increased; sees psychiatrist with next appointment this month. Denies HI/SI Dentist: few years ago   HTN:  - Elevated BP today with repeat improved but still elevated  - per chart review, patient was on HCTZ  - didn't take Metoprolol today - no chest pain  - no HA, visual changes - no sob    ROS: See HPI.  [place]:  Family History reviewed and updated in chart SH: note above  Reviewed medication list: advised to refrain from NSAIDs with history of gastritis and GERD. Reports GERD is acting up.   PHYSICAL EXAM: BP 174/107 mmHg  Pulse 113  Temp(Src) 98.3 F (36.8 C) (Oral)  Ht 5\' 2"  (1.575 m)  Wt 255 lb (115.667 kg)  BMI 46.63 kg/m2  SpO2 96%  Repeat BP: 159/102 Gen: NAD, pleasant, cooperative HEENT: NCAT, PERRL, no palpable thyromegaly or anterior cervical lymphadenopathy Heart: RRR, no murmurs Lungs: CTAB, NWOB Abdomen: soft, nontender to palpation Neuro: grossly nonfocal, speech normal  ASSESSMENT/PLAN:  # Health maintenance:  -STD screening: declined  -pap smear: not  indicated due to total hysterectomy for fibroids  -mammogram: information given to make appointment  -lipid screening: lipid panel today (reports she was on statin at some point but stopped receiving refills a while ago.  -immunizations: up to date  -Diet: gave information on [email or web address]  -Encouraged to exercise regularly; consider finding pool to decrease impact on joints  -Referral to GI for colorectal cancer screen   Obesity:  - A1c screening - lipid panel  HLD: currently not on statin, unclear why this is - lipid panel  GERD, symptoms not controlled: due to noncompliance with medication  - refilled Prilosec - advised to avoid ibuprofen for pain med especially if not taking Prilosec; try Tylenol  HTN, uncontrolled: Repeat blood pressure improved but still elevated. Patient is noncompliant with medication.  - discussed the importance of taking Metoprolol regularly  - asked to check BP regularly and keep log - follow up in 1 week  - may need to add a second medication; per chart review, she was on HCTZ but it does not seem that she was on two medications concurrently. It seems that HCTZ was switched to Metoprolol but it is unclear in her chart - advised to seek immediate medical attention for symptoms such as chest pain, HA not resolved by OTC medication, etc  FOLLOW UP: - in 1 week for HTN. At this time will also discuss thyroid nodule noted by Dr. Josephina Gip and possible referral for chronic hip pain per patient preference. Patient will need Hep C screening as well at this visit.  Smiley Houseman, MD [place]

## 2016-01-11 ENCOUNTER — Encounter: Payer: Self-pay | Admitting: Gastroenterology

## 2016-01-11 ENCOUNTER — Telehealth: Payer: Self-pay

## 2016-01-11 NOTE — Telephone Encounter (Signed)
I called pt to schedule a morning apt with nutritionist and MD, pt stated she can not do any morning apts. Is it ok to schedule with just Jenne Campus or would you rather you be there as well? If so, when are you available for an afternoon consult with Jenne Campus? Page, cma.

## 2016-01-11 NOTE — Telephone Encounter (Signed)
-----   Message from Smiley Houseman, MD sent at 01/11/2016  9:01 AM EDT ----- Please call and inform patient that her hemoglobin A1c obtained to screen for diabetes is 6.4 which falls in the range of "pre-diabetes". This value has not changed from her A1c about a year ago which was also 6.4. Patient reported that she is interested in making healthy food options at the last visit. Please ask if patient is interested in doing a nutrition visit with me with the [facility] nutritionist observing. This nutrition clinic morning is on July 20th. Please schedule her for a morning time slot if she is interested. (The schedule will be under Dr. Dionisio Paschal schedule).  Thank you

## 2016-01-12 NOTE — Telephone Encounter (Signed)
I won't be available in the afternoon. Its okay, go ahead and schedule an afternoon appointment with Dr. Jenne Campus only. Thank you

## 2016-01-12 NOTE — Telephone Encounter (Signed)
Patient is aware of this and states that she will plan to schedule this when she comes in tomorrow. Ezel Vallone,CMA

## 2016-01-13 ENCOUNTER — Other Ambulatory Visit: Payer: No Typology Code available for payment source

## 2016-01-13 ENCOUNTER — Ambulatory Visit (INDEPENDENT_AMBULATORY_CARE_PROVIDER_SITE_OTHER): Payer: No Typology Code available for payment source | Admitting: *Deleted

## 2016-01-13 VITALS — BP 158/100 | HR 121

## 2016-01-13 DIAGNOSIS — Z0130 Encounter for examination of blood pressure without abnormal findings: Secondary | ICD-10-CM

## 2016-01-13 DIAGNOSIS — E785 Hyperlipidemia, unspecified: Secondary | ICD-10-CM

## 2016-01-13 DIAGNOSIS — I10 Essential (primary) hypertension: Secondary | ICD-10-CM

## 2016-01-13 DIAGNOSIS — Z136 Encounter for screening for cardiovascular disorders: Secondary | ICD-10-CM

## 2016-01-13 LAB — LIPID PANEL
CHOL/HDL RATIO: 3.3 ratio (ref <=5.0)
Cholesterol: 207 mg/dL — ABNORMAL HIGH (ref 125–200)
HDL: 62 mg/dL (ref >=46)
LDL Cholesterol: 121 mg/dL (ref <130)
TRIGLYCERIDES: 118 mg/dL (ref <150)
VLDL: 24 mg/dL (ref <30)

## 2016-01-13 NOTE — Progress Notes (Signed)
   Patient in nurse clinic for blood pressure check.  Patient stated she realized she was taking her Metoprolol incorrectly for a very long time.  She was taking it once daily instead of one tablet twice daily.  She just picked up a new Rx today and will start taking it correctly today.  Advised patient to schedule another nurse visit for blood pressure check by the end of next.  Patient denies chest pain, SOB, dizziness or headache today.  Patient also requested to see a nutritionist.  Will forward patient information to Dr. Kathaleen Grinder.  Will forward to PCP.  Derl Barrow, RN   Today's Vitals   01/13/16 1533  BP: 158/100  Pulse: 121  SpO2: 96%  PainSc: 0-No pain

## 2016-01-19 ENCOUNTER — Ambulatory Visit: Payer: No Typology Code available for payment source | Admitting: Internal Medicine

## 2016-01-20 ENCOUNTER — Ambulatory Visit (INDEPENDENT_AMBULATORY_CARE_PROVIDER_SITE_OTHER): Payer: No Typology Code available for payment source | Admitting: *Deleted

## 2016-01-20 VITALS — BP 148/90 | HR 88

## 2016-01-20 DIAGNOSIS — Z136 Encounter for screening for cardiovascular disorders: Secondary | ICD-10-CM

## 2016-01-20 DIAGNOSIS — I10 Essential (primary) hypertension: Secondary | ICD-10-CM

## 2016-01-20 DIAGNOSIS — Z0130 Encounter for examination of blood pressure without abnormal findings: Secondary | ICD-10-CM

## 2016-01-20 NOTE — Progress Notes (Signed)
   Patient in nurse clinic for blood pressure check.  Patient was in nurse clinic last week for blood pressure check and reported she realized she was not taking her blood pressure medications as prescribed.  Patient reported today she is taking her metoprolol tartrate (LOPRESSOR) 25 MG tablet: 1 tablet twice daily.  Blood pressure has improved since last week.  Patient denies any symptoms today.  Will forward to PCP.  Derl Barrow, RN   Today's Vitals   01/20/16 1531  BP: 148/90  Pulse: 88  SpO2: 98%  PainSc: 0-No pain

## 2016-01-21 ENCOUNTER — Ambulatory Visit (INDEPENDENT_AMBULATORY_CARE_PROVIDER_SITE_OTHER): Payer: No Typology Code available for payment source | Admitting: Internal Medicine

## 2016-01-21 VITALS — Wt 253.2 lb

## 2016-01-21 DIAGNOSIS — M25552 Pain in left hip: Secondary | ICD-10-CM

## 2016-01-21 DIAGNOSIS — R7303 Prediabetes: Secondary | ICD-10-CM

## 2016-01-21 DIAGNOSIS — E119 Type 2 diabetes mellitus without complications: Secondary | ICD-10-CM | POA: Insufficient documentation

## 2016-01-21 DIAGNOSIS — G8929 Other chronic pain: Secondary | ICD-10-CM

## 2016-01-21 MED ORDER — SIMVASTATIN 40 MG PO TABS: 40.0000 mg | ORAL_TABLET | Freq: Every day | ORAL | Status: DC

## 2016-01-21 NOTE — Patient Instructions (Addendum)
Please make a follow up in 2 weeks with me to see how you are doing   When you eat salads try to dip your fork in dressing and then spear the lettuce. (essentially have the dressing on the side of the salad).  Possible places to exercise: [place] or [facility]; [employer] (call [employer] for more information); [employer];   Healthy Snack Ideas: fruit, unsweetened apple sauce, cereal (look for at least 5g of fiber with each serving; additionally try with milk), string cheese, vegetables (carrots or cherry tomatoes)  Diet Recommendations for Diabetes   Starchy (carb) foods: Bread, rice, pasta, potatoes, corn, cereal, grits, crackers, bagels, muffins, all baked goods.  (Fruits, milk, and yogurt also have carbohydrate, but most of these foods will not spike your blood sugar as the starchy foods will.)  A few fruits do cause high blood sugars; use small portions of bananas (limit to 1/2 at a time), grapes, watermelon, oranges, and most tropical fruits.    Protein foods: Meat, fish, poultry, eggs, dairy foods, and beans such as pinto and kidney beans (beans also provide carbohydrate).   These are IDEAL recommendations for blood sugar control and weight management for you to aim for EVENTUALLY.  1. Eat at least 3 meals and 1-2 snacks per day. Never go more than 4-5 hours while awake without eating. Eat breakfast within the first hour of getting up.   2. Limit starchy foods to TWO per meal and ONE per snack. ONE portion of a starchy  food is equal to the following:   - ONE slice of bread (or its equivalent, such as half of a hamburger bun).   - 1/2 cup of a "scoopable" starchy food such as potatoes or rice.   - 15 grams of carbohydrate as shown on food label.  3. Include at every meal: a protein food, a carb food, and vegetables and/or fruit.   - Obtain twice the volume of veg's as protein or carbohydrate foods for both lunch and  dinner.   - Fresh or frozen veg's are best.   - Keep frozen veg's on hand for a quick vegetable serving.

## 2016-01-21 NOTE — Progress Notes (Signed)
Assessment:    Learning Readiness:   Not ready  Contemplating  Ready  Change in progress  Usual eating pattern includes 1 meal (between 4pm and 8pm)  and 2 snacks per day. Frequent foods and beverages include: fried foods, koolaid (3 cups per serving), water;  Vegetables- 6 times a week   Usual physical activity includes: none   24-hr recall: (Up at  7 AM)  First meal ( 2 PM)- pack of crackers ("nabs"- 6 crackers); water  D ( 5:30 PM)- 1 hamburger- with lettuce and tomatoes and salad dressing (creamy-2 tsp) and fries (1.5 cups); koolaid (3 cups)  Snk ( 11:30PM)- pack of nabs (crackers- 6 crackers)   Typical day? Yes.   Fastfood: once a week- pizza or chinese   Sweet Tea during the weekend Never hungry to eat meals.   Handouts given during visit include:  AVS: please see detailed recommendations. Patient also given goals sheet for tracking. Her goals are the following: eat 4 times a day (2 of which should be "real meals"), Exercise three times a week (keep track of duration), at least 10 servings of vegetables a week  We also discussed patient's lab results during the visit: A1c of 6.4 and Lipid panel. With her risk factors, she is agreeable to start moderate intensity statin. We decided to try lifestyle changes to improve A1c prior to starting a medication; will repeat A1c in 3 months. I also discussed with patient over the phone after the visit.   Demonstrated degree of understanding via:  Teach Back  Barriers to learning/adherence to lifestyle change: none  Monitoring/Evaluation:  Follow up in 1-2 weeks   More than 50% of 60 minute appointment spent on counseling.

## 2016-01-27 ENCOUNTER — Encounter: Payer: Self-pay | Admitting: Student

## 2016-01-27 ENCOUNTER — Ambulatory Visit (INDEPENDENT_AMBULATORY_CARE_PROVIDER_SITE_OTHER): Payer: Self-pay | Admitting: Student

## 2016-01-27 ENCOUNTER — Telehealth: Payer: Self-pay | Admitting: *Deleted

## 2016-01-27 VITALS — BP 130/66 | HR 91 | Ht 62.0 in | Wt 250.0 lb

## 2016-01-27 DIAGNOSIS — M1612 Unilateral primary osteoarthritis, left hip: Secondary | ICD-10-CM

## 2016-01-27 MED ORDER — METHYLPREDNISOLONE ACETATE 40 MG/ML IJ SUSP
40.0000 mg | Freq: Once | INTRAMUSCULAR | Status: AC
  Administered 2016-01-27: 40 mg via INTRAMUSCULAR

## 2016-01-27 NOTE — Assessment & Plan Note (Signed)
Left hip injection was attempted with ultrasound guidance. However when placing the ultrasound so that the depth was too great for our needle size. For that reason we'll refer to interventional radiology for a ultrasound guided hip injection as she does need a longer needle. Patient will follow-up as needed after this. She was advised of her options including medications, therapy, surgical consult for replacement in the future. She will follow-up as needed for her hip pain and understands that she may need a hip replacement in the future.

## 2016-01-27 NOTE — Telephone Encounter (Signed)
Will forward to MD to advise. Jazmin Hartsell,CMA  

## 2016-01-27 NOTE — Telephone Encounter (Signed)
Pt calling in wanting dr to call in a $4 medication for her cholesterol at [facility]. Price of medicine she sent in is too high. Senon Nixon Kennon Holter, CMA

## 2016-01-27 NOTE — Progress Notes (Signed)
  Kristin [name] - 60 y.o. female MRN [id number]  Date of birth: 1955/11/27  SUBJECTIVE:  Including CC & ROS.  CC: Left hip pain  Patient presents with left hip pain that has been ongoing for years. She feels as though she is having a flare of her hip pain that began about 2 weeks ago. She has had previous injections in her left hip that have not been helpful. She is unable to take anti-inflammatories due to the fact that she has only one kidney. She is open to trying a hip injection today if it would help her hip pain. She denies any decrease in range of motion of her hip. Denies any radiation of her pain down her left lower extremity. It is worse when she is walking on it and better with rest.   ROS: No unexpected weight loss, fever, chills, swelling, instability, muscle pain, numbness/tingling, redness, otherwise see HPI   HISTORY: Past Medical, Surgical, Social, and Family History Reviewed & Updated per EMR.   Pertinent Historical Findings include: PMHx -  hypertension, history of gastric ulcer, thyroid nodule, renal sclerosis, hyperlipidemia, obesity, depression PSHx -  noncontributory FHx -  noncontributory Medications -amitriptyline, Flexeril, Lopressor, Resolved, simvastatin  DATA REVIEWED: 3 view left hip x-rays reviewed which revealed moderate osteoarthritis of left hip joint with questionable AVN  PHYSICAL EXAM:  VS: BP:130/66  HR:91bpm  TEMP: ( )  RESP:   HT:5\' 2"  (157.5 cm)   WT:250 lb (113.4 kg)  BMI:45.8 PHYSICAL EXAM: Gen: NAD, alert, cooperative with exam, well-appearing HEENT: clear conjunctiva,  CV:  no edema, capillary refill brisk, normal rate Resp: non-labored Skin: no rashes, normal turgor  Neuro: no gross deficits.  Psych:  alert and oriented  Hip: ROM IR: 25 Deg, ER: 45 Deg, Flexion: 120 Deg, Extension: 100 Deg, Abduction: 45 Deg, Adduction: 45 Deg Strength IR: 5/5, ER: 5/5, Flexion: 5/5, Extension: 5/5, Abduction: 5/5, Adduction: 5/5 Pelvic  alignment unremarkable to inspection and palpation. Standing hip rotation and gait without trendelenburg sign / unsteadiness. Greater trochanter with mild tenderness to palpation. Has pain pain with FABER or FADIR and hip joint. No SI joint tenderness and normal minimal SI movement.   ASSESSMENT & PLAN:   Osteoarthritis of hip Left hip injection was attempted with ultrasound guidance. However when placing the ultrasound so that the depth was too great for our needle size. For that reason we'll refer to interventional radiology for a ultrasound guided hip injection as she does need a longer needle. Patient will follow-up as needed after this. She was advised of her options including medications, therapy, surgical consult for replacement in the future. She will follow-up as needed for her hip pain and understands that she may need a hip replacement in the future.

## 2016-01-28 ENCOUNTER — Telehealth: Payer: Self-pay | Admitting: Internal Medicine

## 2016-01-28 DIAGNOSIS — E785 Hyperlipidemia, unspecified: Secondary | ICD-10-CM

## 2016-01-28 MED ORDER — LOVASTATIN 40 MG PO TABS: 40.0000 mg | ORAL_TABLET | Freq: Every day | ORAL | 3 refills | Status: DC

## 2016-01-28 NOTE — Telephone Encounter (Signed)
Patient called on 7/26 to report that she could not afford Simvastatin and requested a medication from [facility] $4 list. Sent Rx to Lovastatin 40mg  daily. Called patient to inform.

## 2016-02-01 ENCOUNTER — Other Ambulatory Visit: Payer: Self-pay | Admitting: Student

## 2016-02-01 DIAGNOSIS — M25552 Pain in left hip: Secondary | ICD-10-CM

## 2016-02-04 ENCOUNTER — Other Ambulatory Visit: Payer: No Typology Code available for payment source

## 2016-02-09 ENCOUNTER — Ambulatory Visit
Admission: RE | Admit: 2016-02-09 | Discharge: 2016-02-09 | Disposition: A | Discharge status: Home / Self Care | Payer: No Typology Code available for payment source | Source: Ambulatory Visit | Attending: Student | Admitting: Student

## 2016-02-09 DIAGNOSIS — M25552 Pain in left hip: Secondary | ICD-10-CM

## 2016-02-09 MED ORDER — IOPAMIDOL (ISOVUE-M 200) INJECTION 41%
1.0000 mL | Freq: Once | INTRAMUSCULAR | Status: AC
Start: 2016-02-09 — End: 2016-02-09
  Administered 2016-02-09: 1 mL via INTRA_ARTICULAR

## 2016-02-09 MED ORDER — METHYLPREDNISOLONE ACETATE 40 MG/ML INJ SUSP (RADIOLOG
120.0000 mg | Freq: Once | INTRAMUSCULAR | Status: AC
  Administered 2016-02-09: 120 mg via INTRA_ARTICULAR

## 2016-03-02 ENCOUNTER — Ambulatory Visit (AMBULATORY_SURGERY_CENTER): Payer: Self-pay | Admitting: *Deleted

## 2016-03-02 VITALS — Ht 62.0 in | Wt 255.2 lb

## 2016-03-02 DIAGNOSIS — Z86010 Personal history of colonic polyps: Secondary | ICD-10-CM

## 2016-03-02 MED ORDER — NA SULFATE-K SULFATE-MG SULF 17.5-3.13-1.6 GM/177ML PO SOLN: 1.0000 | Freq: Once | ORAL | 0 refills | Status: DC

## 2016-03-02 MED ORDER — NA SULFATE-K SULFATE-MG SULF 17.5-3.13-1.6 GM/177ML PO SOLN: 1.0000 | Freq: Once | ORAL | 0 refills | Status: AC

## 2016-03-02 NOTE — Progress Notes (Signed)
No allergies to eggs or soy. No problems with anesthesia.  Pt not given Emmi instructions for colonoscopy; no computer access  No oxygen use  No diet drug use  

## 2016-03-16 ENCOUNTER — Encounter: Payer: Self-pay | Admitting: Gastroenterology

## 2016-03-16 ENCOUNTER — Ambulatory Visit (AMBULATORY_SURGERY_CENTER): Payer: Self-pay | Admitting: Gastroenterology

## 2016-03-16 VITALS — BP 145/85 | HR 85 | Temp 98.9°F | Resp 17 | Ht 62.0 in | Wt 255.0 lb

## 2016-03-16 DIAGNOSIS — Z86010 Personal history of colonic polyps: Secondary | ICD-10-CM

## 2016-03-16 DIAGNOSIS — Z800 Family history of malignant neoplasm of digestive organs: Secondary | ICD-10-CM

## 2016-03-16 MED ORDER — SODIUM CHLORIDE 0.9 % IV SOLN: 500.0000 mL | INTRAVENOUS | Status: DC

## 2016-03-16 NOTE — Op Note (Signed)
[facility] Patient Name: Kristin [name] Procedure Date: 03/16/2016 9:34 AM MRN: [id number] Endoscopist: Ladene Artist , MD Age: 60 Referring MD:  Date of Birth: [birth year] Gender: Female Account #: [id number] Procedure:                Colonoscopy Indications:              Screening in patient at increased risk: Family                            history of 1st-degree relative with colorectal                            cancer, Surveillance: Personal history of                            adenomatous polyps on last colonoscopy > 5 years ago Medicines:                Monitored Anesthesia Care Procedure:                Pre-Anesthesia Assessment:                           - Prior to the procedure, a History and Physical                            was performed, and patient medications and                            allergies were reviewed. The patient's tolerance of                            previous anesthesia was also reviewed. The risks                            and benefits of the procedure and the sedation                            options and risks were discussed with the patient.                            All questions were answered, and informed consent                            was obtained. Prior Anticoagulants: The patient has                            taken no previous anticoagulant or antiplatelet                            agents. ASA Grade Assessment: II - A patient with                            mild systemic disease. After reviewing the risks  and benefits, the patient was deemed in                            satisfactory condition to undergo the procedure.                           After obtaining informed consent, the colonoscope                            was passed under direct vision. Throughout the                            procedure, the patient's blood pressure, pulse, and                            oxygen saturations  were monitored continuously. The                            Model PCF-H190L [id number]) scope was introduced                            through the anus and advanced to the the cecum,                            identified by appendiceal orifice and ileocecal                            valve. The ileocecal valve, appendiceal orifice,                            and rectum were photographed. The quality of the                            bowel preparation was good. The colonoscopy was                            performed without difficulty. The patient tolerated                            the procedure well. Scope In: 9:45:19 AM Scope Out: 10:04:05 AM Scope Withdrawal Time: 0 hours 15 minutes 42 seconds  Total Procedure Duration: 0 hours 18 minutes 46 seconds  Findings:                 Internal hemorrhoids were found during                            retroflexion. The hemorrhoids were small and Grade                            I (internal hemorrhoids that do not prolapse).                           The exam was otherwise without abnormality on  direct and retroflexion views.                           A few small-mouthed diverticula were found in the                            sigmoid colon. Complications:            No immediate complications. Estimated blood loss:                            None. Estimated Blood Loss:     Estimated blood loss: none. Impression:               - Internal hemorrhoids.                           - The examination was otherwise normal on direct                            and retroflexion views.                           - Diverticulosis in the sigmoid colon.                           - No specimens collected. Recommendation:           - Repeat colonoscopy in 5 years for surveillance.                           - Patient has a contact number available for                            emergencies. The signs and symptoms of potential                             delayed complications were discussed with the                            patient. Return to normal activities tomorrow.                            Written discharge instructions were provided to the                            patient.                           - Resume previous diet.                           - Continue present medications.                           - Await pathology results. Ladene Artist, MD 03/16/2016 10:10:53 AM This report has been signed electronically.

## 2016-03-16 NOTE — Progress Notes (Signed)
Report to PACU, RN, vss, BBS= Clear.  

## 2016-03-16 NOTE — Patient Instructions (Signed)
YOU HAD AN ENDOSCOPIC PROCEDURE TODAY AT THE [facility]:   Refer to the procedure report that was given to you for any specific questions about what was found during the examination.  If the procedure report does not answer your questions, please call your gastroenterologist to clarify.  If you requested that your care partner not be given the details of your procedure findings, then the procedure report has been included in a sealed envelope for you to review at your convenience later.  YOU SHOULD EXPECT: Some feelings of bloating in the abdomen. Passage of more gas than usual.  Walking can help get rid of the air that was put into your GI tract during the procedure and reduce the bloating. If you had a lower endoscopy (such as a colonoscopy or flexible sigmoidoscopy) you may notice spotting of blood in your stool or on the toilet paper. If you underwent a bowel prep for your procedure, you may not have a normal bowel movement for a few days.  Please Note:  You might notice some irritation and congestion in your nose or some drainage.  This is from the oxygen used during your procedure.  There is no need for concern and it should clear up in a day or so.  SYMPTOMS TO REPORT IMMEDIATELY:   Following lower endoscopy (colonoscopy or flexible sigmoidoscopy):  Excessive amounts of blood in the stool  Significant tenderness or worsening of abdominal pains  Swelling of the abdomen that is new, acute  Fever of 100F or higher  For urgent or emergent issues, a gastroenterologist can be reached at any hour by calling [phone].   DIET:  We do recommend a small meal at first, but then you may proceed to your regular diet.  Drink plenty of fluids but you should avoid alcoholic beverages for 24 hours.  ACTIVITY:  You should plan to take it easy for the rest of today and you should NOT DRIVE or use heavy machinery until tomorrow (because of the sedation medicines used during the test).     FOLLOW UP: Our staff will call the number listed on your records the next business day following your procedure to check on you and address any questions or concerns that you may have regarding the information given to you following your procedure. If we do not reach you, we will leave a message.  However, if you are feeling well and you are not experiencing any problems, there is no need to return our call.  We will assume that you have returned to your regular daily activities without incident.  If any biopsies were taken you will be contacted by phone or by letter within the next 1-3 weeks.  Please call us at [phone] if you have not heard about the biopsies in 3 weeks.    SIGNATURES/CONFIDENTIALITY: You and/or your care partner have signed paperwork which will be entered into your electronic medical record.  These signatures attest to the fact that that the information above on your After Visit Summary has been reviewed and is understood.  Full responsibility of the confidentiality of this discharge information lies with you and/or your care-partner. 

## 2016-03-17 ENCOUNTER — Telehealth: Payer: Self-pay | Admitting: *Deleted

## 2016-03-17 NOTE — Telephone Encounter (Signed)
  Follow up Call-  Call back number 03/16/2016  Post procedure Call Back phone  # [phone] or [phone]  Permission to leave phone message Yes  Some recent data might be hidden     Patient questions:  Do you have a fever, pain , or abdominal swelling? No. Pain Score  0 *  Have you tolerated food without any problems? Yes.    Have you been able to return to your normal activities? Yes.    Do you have any questions about your discharge instructions: Diet   No. Medications  No. Follow up visit  No.  Do you have questions or concerns about your Care? No.  Actions: * If pain score is 4 or above: No action needed, pain <4.

## 2016-03-18 ENCOUNTER — Ambulatory Visit (INDEPENDENT_AMBULATORY_CARE_PROVIDER_SITE_OTHER): Payer: No Typology Code available for payment source | Admitting: Family Medicine

## 2016-03-18 ENCOUNTER — Encounter: Payer: Self-pay | Admitting: Family Medicine

## 2016-03-18 VITALS — BP 158/94 | HR 88 | Temp 98.2°F | Ht 62.0 in | Wt 257.0 lb

## 2016-03-18 DIAGNOSIS — M1612 Unilateral primary osteoarthritis, left hip: Secondary | ICD-10-CM

## 2016-03-18 DIAGNOSIS — Z23 Encounter for immunization: Secondary | ICD-10-CM

## 2016-03-18 MED ORDER — NAPROXEN 500 MG PO TABS: 500.0000 mg | ORAL_TABLET | Freq: Two times a day (BID) | ORAL | 0 refills | Status: DC | PRN

## 2016-03-18 NOTE — Progress Notes (Signed)
Subjective:     Patient ID: Kristin [name], female   DOB: [birth year], 61 y.o.   MRN: [id number]  Hip Pain   The incident occurred more than 1 week ago (C/O left hip pain radiating to her knee on going for more than 4 weeks). The incident occurred at home. There was no injury mechanism. The pain is present in the left hip. The quality of the pain is described as burning, aching and shooting. The pain is at a severity of 7/10 (pain can be as bad as 10/10 in severity). The pain is moderate. The pain has been fluctuating since onset. Associated symptoms include an inability to bear weight. Pertinent negatives include no loss of motion, loss of sensation, numbness or tingling. Associated symptoms comments: At times she will feel some numbness and tingling but not right now.. The symptoms are aggravated by weight bearing and movement. She has tried NSAIDs (Advil 200 mg 2 tabs as needed. Also uses Baclofen and Flexeril) for the symptoms. The treatment provided moderate relief.   Current Outpatient Prescriptions on File Prior to Visit  Medication Sig Dispense Refill  . amitriptyline (ELAVIL) 50 MG tablet Take 1 tablet (50 mg total) by mouth at bedtime. 30 tablet 1  . aspirin 81 MG tablet Take 81 mg by mouth daily.    . baclofen (LIORESAL) 20 MG tablet Take 20 mg by mouth 2 (two) times daily.    . metoprolol tartrate (LOPRESSOR) 25 MG tablet Take 1 tablet (25 mg total) by mouth 2 (two) times daily. 60 tablet 6  . omeprazole (PRILOSEC) 20 MG capsule Take 1 capsule (20 mg total) by mouth daily. 30 capsule 0  . PROVENTIL HFA 108 (90 BASE) MCG/ACT inhaler INHALE TWO PUFFS BY MOUTH EVERY 6 HOURS AS NEEDED FOR  WHEEZING  OR  SHORTNESS  OF  BREATH 2 each 1  . cyclobenzaprine (FLEXERIL) 10 MG tablet Take 1 tablet (10 mg total) by mouth 3 (three) times daily as needed for muscle spasms. (Patient not taking: Reported on 03/18/2016) 90 tablet 2  . lidocaine (LIDODERM) 5 % Place 1 patch onto the skin daily. Remove &  Discard patch within 12 hours or as directed by MD    . lovastatin (MEVACOR) 40 MG tablet Take 1 tablet (40 mg total) by mouth at bedtime. (Patient not taking: Reported on 03/18/2016) 30 tablet 3   Current Facility-Administered Medications on File Prior to Visit  Medication Dose Route Frequency Provider Last Rate Last Dose  . 0.9 %  sodium chloride infusion  500 mL Intravenous Continuous Ladene Artist, MD       Past Medical History:  Diagnosis Date  . Allergy   . Anemia   . Anxiety   . Arthritis   . Back pain, chronic   . Depression   . GERD (gastroesophageal reflux disease)   . Hyperlipidemia   . Hypertension   . Obesity   . 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 Seizures ([place])   . Somatization disorder   . Tobacco abuse      Review of Systems  Respiratory: Negative.   Cardiovascular: Negative.   Gastrointestinal: Negative.   Musculoskeletal: Positive for arthralgias.       Left hip pain  Neurological: Negative for tingling and numbness.  All other systems reviewed and are negative.  Vitals:   03/18/16 1029  BP: (!) 158/94  Pulse: 88  Temp: 98.2 F (36.8 C)  TempSrc: Oral  Weight: 257  lb (116.6 kg)  Height: 5\' 2"  (1.575 m)       Objective:   Physical Exam  Constitutional: She appears well-developed. No distress.  Cardiovascular: Normal rate, regular rhythm and normal heart sounds.   No murmur heard. Pulmonary/Chest: Effort normal and breath sounds normal. No respiratory distress. She has no wheezes.  Abdominal: Soft. Bowel sounds are normal. She exhibits no distension. There is no tenderness.  Musculoskeletal:       Right hip: Normal.       Left hip: She exhibits decreased range of motion and tenderness. She exhibits normal strength, no swelling, no crepitus and no deformity.  Mild antalgic gait  Nursing note and vitals reviewed.      Assessment:     Left hip OA    Plan:     S/P Intraarticular steroid injection with no major  improvement. I refilled her Naproxen. Referred to PT. Xray ordered, I will call her with result. F/U as needed.  Follow up with PCP soon for BP and health maintenance management.

## 2016-03-18 NOTE — Patient Instructions (Signed)

## 2016-03-21 ENCOUNTER — Telehealth: Payer: Self-pay | Admitting: Family Medicine

## 2016-03-21 ENCOUNTER — Ambulatory Visit
Admission: RE | Admit: 2016-03-21 | Discharge: 2016-03-21 | Disposition: A | Discharge status: Home / Self Care | Payer: No Typology Code available for payment source | Source: Ambulatory Visit | Attending: Family Medicine | Admitting: Family Medicine

## 2016-03-21 ENCOUNTER — Encounter: Payer: Self-pay | Admitting: Internal Medicine

## 2016-03-21 DIAGNOSIS — Z860101 Personal history of adenomatous and serrated colon polyps: Secondary | ICD-10-CM | POA: Insufficient documentation

## 2016-03-21 DIAGNOSIS — Z86010 Personal history of colonic polyps: Secondary | ICD-10-CM | POA: Insufficient documentation

## 2016-03-21 DIAGNOSIS — M1612 Unilateral primary osteoarthritis, left hip: Secondary | ICD-10-CM

## 2016-03-21 NOTE — Telephone Encounter (Signed)
Discussed xray result with her. She will try NSAID and PT. If no improvement, referral to orthopedic recommended. She agreed with plan. She will schedule follow up with her PCP soon.

## 2016-03-23 ENCOUNTER — Ambulatory Visit: Payer: No Typology Code available for payment source | Attending: Family Medicine

## 2016-03-23 DIAGNOSIS — M25552 Pain in left hip: Secondary | ICD-10-CM | POA: Insufficient documentation

## 2016-03-23 DIAGNOSIS — R531 Weakness: Secondary | ICD-10-CM | POA: Insufficient documentation

## 2016-03-23 DIAGNOSIS — M6281 Muscle weakness (generalized): Secondary | ICD-10-CM | POA: Insufficient documentation

## 2016-03-23 DIAGNOSIS — M1612 Unilateral primary osteoarthritis, left hip: Secondary | ICD-10-CM

## 2016-03-23 NOTE — Therapy (Signed)
[place], [place], [address] Phone: [phone]   Fax:  [phone]  Physical Therapy Evaluation  Patient Details  Name: Kristin [name] MRN: [id number] Date of Birth: 1956/06/28 Referring Provider: Dellia Cloud, MD  Encounter Date: 03/23/2016      PT End of Session - 03/23/16 0931    Visit Number 1   Number of Visits 8   Date for PT Re-Evaluation 04/22/16   Authorization Type GCCN    PT Start Time 0930   PT Stop Time 1015   PT Time Calculation (min) 45 min   Activity Tolerance Patient tolerated treatment well   Behavior During Therapy [facility] for tasks assessed/performed      Past Medical History:  Diagnosis Date  . Allergy   . Anemia   . Anxiety   . Arthritis   . Back pain, chronic   . Depression   . GERD (gastroesophageal reflux disease)   . Hyperlipidemia   . Hypertension   . Obesity   . 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 Seizures ([place])   . Somatization disorder   . Tobacco abuse     Past Surgical History:  Procedure Laterality Date  . ABDOMINAL HYSTERECTOMY    . CHOLECYSTECTOMY    . kidney stent Right 2002  . TONSILLECTOMY Bilateral 12/06/2014   Procedure: TONSILLECTOMY, BILATERAL;  Surgeon: Ruby Cola, MD;  Location: [facility];  Service: ENT;  Laterality: Bilateral;  . TUBAL LIGATION      There were no vitals filed for this visit.       Subjective Assessment - 03/23/16 0939    Subjective She reports onset of hip pain without injury. Pain started over a year ago. She received injection with benefit lasted awhile. She was in PT until 02/12/15. She is doing her exercises with stretching with varied benefit.  Marland Kitchen  Recent onset of incr hip pain about 3 months ago.     How long can you sit comfortably? 30 min   How long can you stand comfortably? 30 min   How long can you walk comfortably? 20 min   Diagnostic tests xray: severe OA Lt hip   Patient Stated Goals To ease  pain   Currently in Pain? Yes   Pain Score 7   took medications 90 min ago   Pain Location Hip   Pain Orientation Left;Anterior;Lateral   Pain Descriptors / Indicators Aching   Pain Type Chronic pain   Pain Radiating Towards has gone lateral thigh to foot   Pain Onset More than a month ago   Pain Frequency Constant   Aggravating Factors  sit/stand /walk prolonge periods   Pain Relieving Factors medication    Multiple Pain Sites No            [facility] PT Assessment - 03/23/16 0933      Assessment   Medical Diagnosis OA LT hip with pain   Referring Provider Dellia Cloud, MD   Onset Date/Surgical Date --  recent worsening 3 months ago.    Next MD Visit not set yet   Prior Therapy PT last year, Pain was better  6 months     Precautions   Precautions None     Restrictions   Weight Bearing Restrictions No     Balance Screen   Has the patient fallen in the past 6 months No   Has the patient had a decrease in activity level because of  a fear of falling?  No   Is the patient reluctant to leave their home because of a fear of falling?  No     Prior Function   Level of Independence Independent  may take longer     Cognition   Overall Cognitive Status Within Functional Limits for tasks assessed     Posture/Postural Control   Posture Comments forward trunk RT ilia higher than LT     ROM / Strength   AROM / PROM / Strength AROM;Strength     AROM   Overall AROM Comments Lt hip rotation 20 degrees  INT/ext,     RT INT rotation 45  ext rot 30     AROM Assessment Site Hip     Strength   Overall Strength Comments WNL both LE except LT hip flexion abduction and extension  4/5 with pain     Flexibility   Soft Tissue Assessment /Muscle Length yes   Hamstrings RT 90 LT 85     Special Tests    Special Tests Hip Special Tests   Hip Special Tests  Saralyn Pilar (FABER) Test;Ober's Test     Saralyn Pilar [facility]) Test   Findings Positive   Side Left;Right  painful/tight   Comments negative  for pain on RT but decreased ROm     Ober's Test   Findings Positive   Side Left     Ambulation/Gait   Assistive device None   Gait Pattern Trendelenburg  mild   Ambulation Surface Level                   [facility] Adult PT Treatment/Exercise - 03/23/16 0933      Manual Therapy   Manual Therapy Manual Traction   Manual Traction RT hip distraction at 30/30 grade 4                 PT Education - 03/23/16 1139    Education provided Yes   Education Details POC   Person(s) Educated Patient   Methods Explanation   Comprehension Verbalized understanding             PT Long Term Goals - 03/23/16 0929      PT LONG TERM GOAL #1   Title The patient will be indep with HEP for flexibility, postural stabilization, and strengthening.   Time 4   Period Weeks   Status New     PT LONG TERM GOAL #2   Title Independent with all HEP issued    Time 4   Period Weeks   Status New     PT LONG TERM GOAL #3   Title Report of pain decreased 50% or more with weight bearing activity   Time 4   Period Weeks   Status New     PT LONG TERM GOAL #4   Title She will be able to sit for 45-60 min without incr pain   Time 4   Period Weeks   Status New               Plan - 03/23/16 0932    Clinical Impression Statement Ms Buckelew presents for low complexity eval with reports of LT hip over > 1 year duration that is worse in past 3 months PT in past was helpful but did not last and recent injection was of no benefit. Reportedly the OA is severe now. Her ROM remains goos except with rotation that is limited and painful on Lt . She has weakness of  hip 4/5 inpart due to pain. Pain is limiting all aspect of life though she is not incapacitated. She may improve but most likely will need Ortho MD consult and possible THA   Rehab Potential Fair   PT Frequency 2x / week   PT Duration 4 weeks   PT Treatment/Interventions Cryotherapy;Electrical Stimulation;Iontophoresis 4mg /ml  Dexamethasone;Moist Heat;Traction;Ultrasound;Passive range of motion;Patient/family education;Taping;Manual techniques;Therapeutic exercise;Dry needling   PT Next Visit Plan HEP for strength, modalities for pain, manual for pain, possible tape   Consulted and Agree with Plan of Care Patient      Patient will benefit from skilled therapeutic intervention in order to improve the following deficits and impairments:  Decreased range of motion, Increased muscle spasms, Pain, Decreased strength, Decreased activity tolerance, Obesity  Visit Diagnosis: Osteoarthritis of left hip, unspecified osteoarthritis type  Pain in left hip  Muscle weakness (generalized)     Problem List Patient Active Problem List   Diagnosis Date Noted  . History of adenomatous polyp of colon 03/21/2016  . Prediabetes 01/21/2016  . Thyroid nodule 12/25/2014  . ARDS (adult respiratory distress syndrome) ([place])   . Hypoxia   . Infiltrate noted on imaging study   . Acute respiratory failure with hypoxia ([place])   . Pharyngeal swelling   . Tonsillar abscess 12/06/2014  . Tonsil, abscess 12/06/2014  . Cervical spine arthritis ([place]) 11/14/2014  . Osteoarthritis of hip 11/14/2014  . Loss of consciousness 11/14/2014  . Cough 05/26/2014  . Musculoskeletal chest and rib pain 03/26/2014  . Rib pain on right side 02/28/2014  . Groin pain 04/18/2013  . Tension headache 12/22/2012  . UNSPECIFIED RENAL SCLEROSIS 01/16/2009  . GERD, SEVERE 05/25/2007  . OBESITY NOS 04/03/2007  . HYPERLIPIDEMIA 08/31/2006  . Major depression, recurrent ([place]) 08/31/2006  . 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 Former smoker 08/31/2006  . HYPERTENSION, BENIGN SYSTEMIC 08/31/2006  . GASTRIC ULCER ACUTE WITHOUT HEMORRHAGE 08/31/2006  . CONVULSIONS, SEIZURES, NOS 08/31/2006    Darrel Hoover  PT 03/23/2016, 12:01 PM  [facility] [address] [place], [place], [address] Phone:  [phone]   Fax:  [phone]  Name: Kristin [name] MRN: [id number] Date of Birth: 1955/07/11

## 2016-03-25 ENCOUNTER — Other Ambulatory Visit: Payer: Self-pay | Admitting: Internal Medicine

## 2016-03-28 ENCOUNTER — Ambulatory Visit: Payer: No Typology Code available for payment source | Admitting: Physical Therapy

## 2016-03-28 DIAGNOSIS — R531 Weakness: Secondary | ICD-10-CM

## 2016-03-28 DIAGNOSIS — M25552 Pain in left hip: Secondary | ICD-10-CM

## 2016-03-28 DIAGNOSIS — M1612 Unilateral primary osteoarthritis, left hip: Secondary | ICD-10-CM

## 2016-03-28 DIAGNOSIS — M6281 Muscle weakness (generalized): Secondary | ICD-10-CM

## 2016-03-28 NOTE — Therapy (Signed)
[facility] [address] [place], [place], [address] Phone: [phone]   Fax:  [phone]  Physical Therapy Treatment  Patient Details  Name: Kristin [name] MRN: [id number] Date of Birth: 1955/09/23 Referring Provider: Janit Pagan, MD  Encounter Date: 03/28/2016      PT End of Session - 03/28/16 1149    Visit Number 2   Number of Visits 8   Date for PT Re-Evaluation 04/22/16   PT Start Time 1102   PT Stop Time 1155   PT Time Calculation (min) 53 min   Activity Tolerance Patient tolerated treatment well   Behavior During Therapy [facility] for tasks assessed/performed      Past Medical History:  Diagnosis Date  . Allergy   . Anemia   . Anxiety   . Arthritis   . Back pain, chronic   . Depression   . GERD (gastroesophageal reflux disease)   . Hyperlipidemia   . Hypertension   . Obesity   . 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 Seizures ([facility])   . Somatization disorder   . Tobacco abuse     Past Surgical History:  Procedure Laterality Date  . ABDOMINAL HYSTERECTOMY    . CHOLECYSTECTOMY    . kidney stent Right 2002  . TONSILLECTOMY Bilateral 12/06/2014   Procedure: TONSILLECTOMY, BILATERAL;  Surgeon: Melvenia Beam, MD;  Location: [facility] OR;  Service: ENT;  Laterality: Bilateral;  . TUBAL LIGATION      There were no vitals filed for this visit.      Subjective Assessment - 03/28/16 1059    Subjective She has been doing her stretches. Sometimes they are painful.     Currently in Pain? Yes   Pain Score 9    Pain Location Hip   Pain Orientation Left;Posterior   Pain Descriptors / Indicators Spasm   Pain Type Chronic pain   Pain Radiating Towards to foot   Pain Frequency Several days a week  2-3 X a week,     Aggravating Factors  getting up from sitting,  walking   Pain Relieving Factors medication, iice pack,  arthritis rub. rest   Effect of Pain on Daily Activities has trouble getting to  sleep.  Limits walking,                          [facility] Adult PT Treatment/Exercise - 03/28/16 0001      Ambulation/Gait   Gait Comments 30 3/4 inches height of cane.  Education for use for pain control.  40 feet in gym,  decreased trendelenberg noted with cane.      Lumbar Exercises: Stretches   Single Knee to Chest Stretch 5 reps;10 seconds   Single Knee to Chest Stretch Limitations correctly done   Lower Trunk Rotation 10 seconds   Lower Trunk Rotation Limitations 10 X    Pelvic Tilt --  10 X     Lumbar Exercises: Supine   Clam 10 reps   Clam Limitations cued initially   Bent Knee Raise 10 reps   Bent Knee Raise Limitations with tilt     Knee/Hip Exercises: Stretches   Piriformis Stretch 2 reps   Piriformis Stretch Limitations different ways,  instantly painful so stopped   Other Knee/Hip Stretches Groin, inner thigh stretch issued from ex drawer,  resting thigh on bolster so she can get a mild stretch vs pain and guarding (Modified from legs crossed)  HEP     Cryotherapy   Number Minutes Cryotherapy 10 Minutes   Cryotherapy Location Hip  and left gluteal.   Type of Cryotherapy --  cold pack     Manual Therapy   Manual therapy comments retrograde, instrument assist with education,  use of pilow/ heat/ ice during manual.  Able to decrease pain with soft tissue work.                 PT Education - 03/28/16 1148    Education provided Yes   Education Details modified hip stretched issued from ex drawer,  Feet together, knees apart with support.   Gait with a cane.   Person(s) Educated Patient   Methods Explanation;Demonstration;Tactile cues;Verbal cues;Handout   Comprehension Verbalized understanding;Returned demonstration             PT Long Term Goals - 03/28/16 1107      PT LONG TERM GOAL #1   Title The patient will be indep with HEP for flexibility, postural stabilization, and strengthening.   Baseline cues needed   Time 4   Period  Weeks   Status On-going     PT LONG TERM GOAL #2   Title Independent with all HEP issued    Baseline cus eeded   Time 4   Period Weeks   Status On-going     PT LONG TERM GOAL #3   Title Report of pain decreased 50% or more with weight bearing activity   Baseline up tpo 9/10   Time 4   Period Weeks   Status On-going     PT LONG TERM GOAL #4   Title She will be able to sit for 45-60 min without incr pain   Baseline 30 at the most   Time 4   Period Weeks   Status On-going               Plan - 03/28/16 1149    Clinical Impression Statement Piriformis stretch too painful , better with modifications.  Limp decreased with use of cane.  Less congestion in gluteal and hip after manual.  Less apin at end of session.    PT Next Visit Plan review stretch chech gait.  Tape?  Strength ex for home if ready.    Consulted and Agree with Plan of Care Patient      Patient will benefit from skilled therapeutic intervention in order to improve the following deficits and impairments:  Decreased range of motion, Increased muscle spasms, Pain, Decreased strength, Decreased activity tolerance, Obesity  Visit Diagnosis: Osteoarthritis of left hip, unspecified osteoarthritis type  Pain in left hip  Muscle weakness (generalized)  Generalized weakness     Problem List Patient Active Problem List   Diagnosis Date Noted  . History of adenomatous polyp of colon 03/21/2016  . Prediabetes 01/21/2016  . Thyroid nodule 12/25/2014  . ARDS (adult respiratory distress syndrome) ([facility])   . Hypoxia   . Infiltrate noted on imaging study   . Acute respiratory failure with hypoxia ([facility])   . Pharyngeal swelling   . Tonsillar abscess 12/06/2014  . Tonsil, abscess 12/06/2014  . Cervical spine arthritis ([facility]) 11/14/2014  . Osteoarthritis of hip 11/14/2014  . Loss of consciousness 11/14/2014  . Cough 05/26/2014  . Musculoskeletal chest and rib pain 03/26/2014  . Rib pain on right side  02/28/2014  . Groin pain 04/18/2013  . Tension headache 12/22/2012  . UNSPECIFIED RENAL SCLEROSIS 01/16/2009  . GERD, SEVERE 05/25/2007  . OBESITY  NOS 04/03/2007  . HYPERLIPIDEMIA 08/31/2006  . Major depression, recurrent ([facility]) 08/31/2006  . 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 Former smoker 08/31/2006  . HYPERTENSION, BENIGN SYSTEMIC 08/31/2006  . GASTRIC ULCER ACUTE WITHOUT HEMORRHAGE 08/31/2006  . CONVULSIONS, SEIZURES, NOS 08/31/2006    Nyle Limb PTA 03/28/2016, 11:55 AM  [facility] [address] [place], [place], [address] Phone: [phone]   Fax:  [phone]  Name: TANVEER BERGSTRAND MRN: [id number] Date of Birth: 28-Aug-1955

## 2016-03-29 ENCOUNTER — Other Ambulatory Visit: Payer: Self-pay | Admitting: Internal Medicine

## 2016-03-31 ENCOUNTER — Ambulatory Visit: Payer: No Typology Code available for payment source

## 2016-03-31 DIAGNOSIS — M25552 Pain in left hip: Secondary | ICD-10-CM

## 2016-03-31 DIAGNOSIS — M6281 Muscle weakness (generalized): Secondary | ICD-10-CM

## 2016-03-31 DIAGNOSIS — R531 Weakness: Secondary | ICD-10-CM

## 2016-03-31 DIAGNOSIS — M1612 Unilateral primary osteoarthritis, left hip: Secondary | ICD-10-CM

## 2016-03-31 NOTE — Therapy (Signed)
[place], [place], [address] Phone: [phone]   Fax:  [phone]  Physical Therapy Treatment  Patient Details  Name: Kristin [name] MRN: [id number] Date of Birth: 1955-08-23 Referring Provider: Andrena Mews, MD  Encounter Date: 03/31/2016      PT End of Session - 03/31/16 1229    Visit Number 3   Number of Visits 8   Date for PT Re-Evaluation 04/22/16   Authorization Type GCCN    PT Start Time 1230   PT Stop Time 1325   PT Time Calculation (min) 55 min   Activity Tolerance Patient tolerated treatment well;Patient limited by pain   Behavior During Therapy [facility] for tasks assessed/performed      Past Medical History:  Diagnosis Date  . Allergy   . Anemia   . Anxiety   . Arthritis   . Back pain, chronic   . Depression   . GERD (gastroesophageal reflux disease)   . Hyperlipidemia   . Hypertension   . Obesity   . 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 Seizures ([place])   . Somatization disorder   . Tobacco abuse     Past Surgical History:  Procedure Laterality Date  . ABDOMINAL HYSTERECTOMY    . CHOLECYSTECTOMY    . kidney stent Right 2002  . TONSILLECTOMY Bilateral 12/06/2014   Procedure: TONSILLECTOMY, BILATERAL;  Surgeon: Ruby Cola, MD;  Location: [facility];  Service: ENT;  Laterality: Bilateral;  . TUBAL LIGATION      There were no vitals filed for this visit.      Subjective Assessment - 03/31/16 1234    Subjective Doing OK today Pain 4/10 in hip   Currently in Pain? Yes   Pain Score 4    Pain Location Hip   Pain Orientation Left;Posterior   Pain Descriptors / Indicators Spasm   Pain Type Chronic pain   Pain Radiating Towards to  foot   Pain Onset More than a month ago   Aggravating Factors  stanf from sit , wlaking   Pain Relieving Factors meds , cold, rest   Multiple Pain Sites No                         [facility] Adult PT  Treatment/Exercise - 03/31/16 1230      Lumbar Exercises: Supine   Clam 15 reps;5 seconds   Clam Limitations both feet on large wedge with    Bridge 15 reps   Bridge Limitations LT leg with leg bent foot on large wedge  short raise.  Also bilateral bridge short lift x 15 3 sec hold   Other Supine Lumbar Exercises Leg on large wedge  LT hip adduction and abduction x 20  and ball squeeze between knees x 15 5 sec hold      Knee/Hip Exercises: Aerobic   Nustep L5 UE and LE  5 min     Manual Therapy   Manual Traction RT hip distraction with osscilations Gr 3-4 3 bouts of 3-4 min                PT Education - 03/31/16 1324    Education provided Yes   Education Details HEP   Person(s) Educated Patient   Methods Explanation;Tactile cues;Verbal cues;Handout   Comprehension Returned demonstration;Verbalized understanding             PT Long Term Goals - 03/28/16 1107  PT LONG TERM GOAL #1   Title The patient will be indep with HEP for flexibility, postural stabilization, and strengthening.   Baseline cues needed   Time 4   Period Weeks   Status On-going     PT LONG TERM GOAL #2   Title Independent with all HEP issued    Baseline cus eeded   Time 4   Period Weeks   Status On-going     PT LONG TERM GOAL #3   Title Report of pain decreased 50% or more with weight bearing activity   Baseline up tpo 9/10   Time 4   Period Weeks   Status On-going     PT LONG TERM GOAL #4   Title She will be able to sit for 45-60 min without incr pain   Baseline 30 at the most   Time 4   Period Weeks   Status On-going               Plan - 03/31/16 1230    Clinical Impression Statement Pain better today than last session though pain generally variable. . With exercise she reported 6-7/10 pain but no changes in expression  or body movements to indicate movement into mod/severe pain .   At end of session /icde she was no worse   PT Treatment/Interventions  Cryotherapy;Electrical Stimulation;Iontophoresis 4mg /ml Dexamethasone;Moist Heat;Traction;Ultrasound;Passive range of motion;Patient/family education;Taping;Manual techniques;Therapeutic exercise;Dry needling   PT Next Visit Plan continue mobs, stretch stab/strength as tolerated   Consulted and Agree with Plan of Care Patient      Patient will benefit from skilled therapeutic intervention in order to improve the following deficits and impairments:  Decreased range of motion, Increased muscle spasms, Pain, Decreased strength, Decreased activity tolerance, Obesity  Visit Diagnosis: Osteoarthritis of left hip, unspecified osteoarthritis type  Pain in left hip  Muscle weakness (generalized)  Generalized weakness  Hip pain, left     Problem List Patient Active Problem List   Diagnosis Date Noted  . History of adenomatous polyp of colon 03/21/2016  . Prediabetes 01/21/2016  . Thyroid nodule 12/25/2014  . ARDS (adult respiratory distress syndrome) ([place])   . Hypoxia   . Infiltrate noted on imaging study   . Acute respiratory failure with hypoxia ([place])   . Pharyngeal swelling   . Tonsillar abscess 12/06/2014  . Tonsil, abscess 12/06/2014  . Cervical spine arthritis ([place]) 11/14/2014  . Osteoarthritis of hip 11/14/2014  . Loss of consciousness 11/14/2014  . Cough 05/26/2014  . Musculoskeletal chest and rib pain 03/26/2014  . Rib pain on right side 02/28/2014  . Groin pain 04/18/2013  . Tension headache 12/22/2012  . UNSPECIFIED RENAL SCLEROSIS 01/16/2009  . GERD, SEVERE 05/25/2007  . OBESITY NOS 04/03/2007  . HYPERLIPIDEMIA 08/31/2006  . Major depression, recurrent ([place]) 08/31/2006  . 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 Former smoker 08/31/2006  . HYPERTENSION, BENIGN SYSTEMIC 08/31/2006  . GASTRIC ULCER ACUTE WITHOUT HEMORRHAGE 08/31/2006  . CONVULSIONS, SEIZURES, NOS 08/31/2006    Darrel Hoover  PT 03/31/2016, 1:26 PM  [facility] [address] [place], [place], [address] Phone: [phone]   Fax:  [phone]  Name: Kristin [name] MRN: [id number] Date of Birth: Nov 30, 1955

## 2016-04-05 ENCOUNTER — Ambulatory Visit: Payer: No Typology Code available for payment source | Attending: Family Medicine | Admitting: Physical Therapy

## 2016-04-05 DIAGNOSIS — R531 Weakness: Secondary | ICD-10-CM | POA: Insufficient documentation

## 2016-04-05 DIAGNOSIS — M1612 Unilateral primary osteoarthritis, left hip: Secondary | ICD-10-CM | POA: Insufficient documentation

## 2016-04-05 DIAGNOSIS — M6281 Muscle weakness (generalized): Secondary | ICD-10-CM

## 2016-04-05 DIAGNOSIS — M25552 Pain in left hip: Secondary | ICD-10-CM | POA: Insufficient documentation

## 2016-04-05 NOTE — Therapy (Signed)
[name], [place], [address] Phone: [phone]   Fax:  [phone]  Physical Therapy Treatment  Patient Details  Name: Kristin [name] MRN: [id number] Date of Birth: 01/22/1956 Referring Provider: Andrena Mews, MD  Encounter Date: 04/05/2016      PT End of Session - 04/05/16 1414    Visit Number 4   Number of Visits 8   Date for PT Re-Evaluation 04/22/16   PT Start Time 1103   PT Stop Time 1200   PT Time Calculation (min) 57 min   Activity Tolerance Patient tolerated treatment well   Behavior During Therapy [facility] for tasks assessed/performed      Past Medical History:  Diagnosis Date  . Allergy   . Anemia   . Anxiety   . Arthritis   . Back pain, chronic   . Depression   . GERD (gastroesophageal reflux disease)   . Hyperlipidemia   . Hypertension   . Obesity   . 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 Seizures ([place])   . Somatization disorder   . Tobacco abuse     Past Surgical History:  Procedure Laterality Date  . ABDOMINAL HYSTERECTOMY    . CHOLECYSTECTOMY    . kidney stent Right 2002  . TONSILLECTOMY Bilateral 12/06/2014   Procedure: TONSILLECTOMY, BILATERAL;  Surgeon: Ruby Cola, MD;  Location: [place];  Service: ENT;  Laterality: Bilateral;  . TUBAL LIGATION      There were no vitals filed for this visit.      Subjective Assessment - 04/05/16 1107    Subjective 7/10 pain. Pain seemed to hurt a little more.Hip back leg left. Was able to walk a little more over the weekend.  She was sore, stiff the next day. (Back vs exercises soreness)  Pain less frequent.   Currently in Pain? Yes   Pain Score 7    Pain Location Hip   Pain Orientation Left;Posterior   Pain Descriptors / Indicators Cramping;Sharp   Pain Radiating Towards to knee   Aggravating Factors  walking a little more on Sat.    Pain Relieving Factors meds, cold , rest   Effect of Pain on Daily  Activities has trouble getting to sleep,  limits some walking   Multiple Pain Sites No                         [facility] Adult PT Treatment/Exercise - 04/05/16 0001      Lumbar Exercises: Stretches   Single Knee to Chest Stretch Limitations painful today   Prone on Elbows Stretch --  3 min.did not help pain ,increased rear pain, leg = pain     Lumbar Exercises: Supine   Clam Limitations isometric clame 10 X 5 seconds,  isometric ball squeeze 10 x 5 seconds   Bridge 10 reps   Bridge Limitations small height,over large wedge.    Other Supine Lumbar Exercises legs on large wedge:  heel squeeze  10 x     Knee/Hip Exercises: Stretches   Piriformis Stretch 1 rep;30 seconds   Piriformis Stretch Limitations during manual with strumming.to lengthen     Cryotherapy   Number Minutes Cryotherapy 10 Minutes   Cryotherapy Location Hip   Type of Cryotherapy --  cold pack     Ultrasound   Ultrasound Location piriformis   Ultrasound Parameters 100%, 1.5 watts/cn2   Ultrasound Goals Pain     Manual  Therapy   Manual therapy comments soft tissue work Left gluteal and manual stretch   Manual Traction 4 bouts of hip distraction  20-30 seconds each                     PT Long Term Goals - 04/05/16 1417      PT LONG TERM GOAL #1   Title The patient will be indep with HEP for flexibility, postural stabilization, and strengthening.   Baseline cues for exercises and pain with exercise   Time 4   Period Weeks   Status On-going     PT LONG TERM GOAL #2   Title Independent with all HEP issued    Baseline cues   Time 4   Period Weeks   Status On-going     PT LONG TERM GOAL #3   Title Report of pain decreased 50% or more with weight bearing activity   Baseline 7/10   Time 4   Period Weeks   Status On-going     PT LONG TERM GOAL #4   Title She will be able to sit for 45-60 min without incr pain   Time 4   Period Weeks   Status Unable to assess                Plan - 04/05/16 1414    Clinical Impression Statement Pain helped last visit most of the day.  Today pain unchanged after exercise, [place] and manual.  Patient had positive trendelenberg post session.    PT Next Visit Plan continue mobs, stretch stab/strength as tolerated.  Check leg length?   PT Home Exercise Plan continue,  Stop if leg pain radiated down leg   Consulted and Agree with Plan of Care Patient      Patient will benefit from skilled therapeutic intervention in order to improve the following deficits and impairments:  Decreased range of motion, Increased muscle spasms, Pain, Decreased strength, Decreased activity tolerance, Obesity  Visit Diagnosis: Osteoarthritis of left hip, unspecified osteoarthritis type  Pain in left hip  Muscle weakness (generalized)  Generalized weakness  Hip pain, left     Problem List Patient Active Problem List   Diagnosis Date Noted  . History of adenomatous polyp of colon 03/21/2016  . Prediabetes 01/21/2016  . Thyroid nodule 12/25/2014  . ARDS (adult respiratory distress syndrome) ([place])   . Hypoxia   . Infiltrate noted on imaging study   . Acute respiratory failure with hypoxia ([place])   . Pharyngeal swelling   . Tonsillar abscess 12/06/2014  . Tonsil, abscess 12/06/2014  . Cervical spine arthritis ([place]) 11/14/2014  . Osteoarthritis of hip 11/14/2014  . Loss of consciousness ([place]) 11/14/2014  . Cough 05/26/2014  . Musculoskeletal chest and rib pain 03/26/2014  . Rib pain on right side 02/28/2014  . Groin pain 04/18/2013  . Tension headache 12/22/2012  . UNSPECIFIED RENAL SCLEROSIS 01/16/2009  . GERD, SEVERE 05/25/2007  . OBESITY NOS 04/03/2007  . HYPERLIPIDEMIA 08/31/2006  . Major depression, recurrent ([place]) 08/31/2006  . 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 Former smoker 08/31/2006  . HYPERTENSION, BENIGN SYSTEMIC 08/31/2006  . GASTRIC ULCER ACUTE WITHOUT HEMORRHAGE 08/31/2006  . CONVULSIONS, SEIZURES, NOS  08/31/2006    HARRIS,KARENPTA  04/05/2016, 2:19 PM  [facility] [address] [place], [place], [address] Phone: [phone]   Fax:  [phone]  Name: Kristin [name] MRN: [id number] Date of Birth: September 07, 1955

## 2016-04-07 ENCOUNTER — Encounter: Payer: Self-pay | Admitting: Family Medicine

## 2016-04-07 ENCOUNTER — Ambulatory Visit (INDEPENDENT_AMBULATORY_CARE_PROVIDER_SITE_OTHER): Payer: No Typology Code available for payment source | Admitting: Family Medicine

## 2016-04-07 ENCOUNTER — Ambulatory Visit: Payer: No Typology Code available for payment source | Admitting: Physical Therapy

## 2016-04-07 VITALS — BP 164/88 | HR 90 | Temp 98.8°F | Ht 62.0 in | Wt 255.6 lb

## 2016-04-07 DIAGNOSIS — I10 Essential (primary) hypertension: Secondary | ICD-10-CM

## 2016-04-07 DIAGNOSIS — M1611 Unilateral primary osteoarthritis, right hip: Secondary | ICD-10-CM

## 2016-04-07 MED ORDER — HYDROCHLOROTHIAZIDE 25 MG PO TABS: 25.0000 mg | ORAL_TABLET | Freq: Every day | ORAL | 3 refills | Status: DC

## 2016-04-07 NOTE — Progress Notes (Signed)
   HPI  CC: Hip pain  Kristin [name] is a 60 y.o. , with the above CC.   Hip Pain Patient complains of left hip pain. Onset of the symptoms was several years ago. Inciting event: none. The patient reports the hip pain is worse with weight bearing, is aggravated by walking and radiates to left leg. Associated symptoms: none. Aggravating symptoms include: any weight bearing, going up and down stairs, pivoting, rising after sitting, squatting, standing and walking. Patient has had prior hip problems. Previous visits for this problem: yes, last seen 1 month ago by the patient's PCP. Evaluation to date: plain films, which were abnormal  severe osteoarthritis of hip, joint injections lasted only a couple weeks. and an Orthopedics consult, discussed possibility of surgery in future if not improvement from PT.. Treatment to date: OTC analgesics, which have been not very effective, prescription analgesics, which have been somewhat effective, home exercise program, which has been somewhat effective and physical therapy, which has been somewhat effective. 6-7/10 pain right now, at worst 10/10 and cannot walk/weight bear. Has done PT 3 times over past 2 years, and getting minimal improvement. Affecting life by not being mobile, not able to exercise, etc.  Feels like she is a fall risk.   Hypertension Patient is here for follow-up of elevated blood pressure. She is not exercising and is not adherent to a low-salt diet. Blood pressure is not well controlled at home. Cardiac symptoms: lower extremity edema. Patient denies chest pain, chest pressure/discomfort, claudication, dyspnea, exertional chest pressure/discomfort, irregular heart beat, near-syncope, orthopnea and paroxysmal nocturnal dyspnea. Cardiovascular risk factors: dyslipidemia, hypertension, obesity (BMI >= 30 kg/m2) and sedentary lifestyle. Use of agents associated with hypertension: NSAIDS. History of target organ damage: none.   The following  portions of the patient's history were reviewed and updated as appropriate: allergies, current medications, past family history, past medical history, past social history, past surgical history and problem list.  ROS: A 12-point review of systems was performed and negative, except as stated in the above HPI.  OBJECTIVE BP (!) 164/88   Pulse 90   Temp 98.8 F (37.1 C) (Oral)   Ht 5\' 2"  (1.575 m)   Wt 255 lb 9.6 oz (115.9 kg)   BMI 46.75 kg/m  Gen: NAD, alert, cooperative, and pleasant. HEENT: NCAT, EOMI, PERRL CV: RRR, no murmur Resp: CTAB, no wheezes, non-labored Abd: SNTND, BS present, no guarding or organomegaly Ext: No edema, warm, trace edema. Limited mobility of left hip due to pain, pain located in groin/lateral/posterior hip joint.  Neuro: Alert and oriented, Speech clear, No gross deficits. Sensation intact. +straight leg raise on left leg, radiating from groin/hip to knee.   ASSESSMENT AND PLAN: 1. Osteoarthritis of right hip, unspecified osteoarthritis type - Continue course of medications and home exercises and PT - Has 4 wheeled walker with brake, bring to PT to make sure fitted correctly (is borrowing from a friend) - AMB referral to orthopedics  2. HYPERTENSION, BENIGN SYSTEMIC - Uncontrolled today, on Lopressor 25mg  BID only (and baby ASA).  - hydrochlorothiazide (HYDRODIURIL) 25 MG tablet; Take 1 tablet (25 mg total) by mouth daily.  Dispense: 30 tablet; Refill: 3 - Recommend stopping or decreasing amount of Naprosyn 2/2 uncontrolled HTN - Return for RN visit for BP check in 2 weeks, return in 3 months for MD check HTN mgmt   Zenda Alpers, [facility] 04/07/2016 11:05 AM

## 2016-04-07 NOTE — Patient Instructions (Signed)
Preparing for Hip Replacement Recovery from hip replacement surgery can be made easier and more comfortable by being prepared before surgery. This includes:   Arranging for others to help you.  Preparing your home.  Preparing your body by having a preoperative exam and being as healthy as you can.  Doing exercises before your surgery as directed by your health care provider. You can ease any concerns about your financial responsibilities by calling your insurance company after you decide to have surgery. In addition to asking about your surgery and hospital stay, you will want to ask about coverage for medical equipment, rehabilitation facilities, and home care.  HOW SHOULD I ARRANGE FOR HELP? You will be stronger and more mobile every day. However, in the first couple weeks after surgery, it is unlikely you will be able to do all your daily activities as easily as before your surgery. You may tire easily and will still have limited movement in your leg. Follow these guidelines to best arrange for the help you may need after your surgery:  Plan to have someone take you home after the procedure. Your health care provider will be able to tell you how many days you can expect to be in the hospital.  Cancel all work, caregiving, and volunteer responsibilities for at least 4-6 weeks after surgery.  If you live alone, arrange for someone to care for your home and pets for the first 4-6 weeks after surgery.  Select someone with whom you feel comfortable to be with you day and night for the first week. This person will help you with your exercises and personal care, such as bathing and using the toilet.  Arrange for drivers to bring you to and from your follow-up appointments, the grocery store, and other places you may need to go for at least 4-6 weeks. [place]?   Pick a recovery spot, but do not plan on recovering in bed. Sitting in a more upright position is better for your  health. You may want to use a recliner with a small table nearby. Choose a chair with a firm seat that will not allow you to sink down into it. Chairs and sofas that are too soft can allow your hip to bend at an angle greater than 90 degrees. This could put you at risk for dislocating your new hip joint. Place the items you use most frequently on the small table. These may include the TV remote, a cordless phone, a book or laptop computer, a water glass, and any other items of your choice.  Remove all clutter from your floors. Also remove any throw rugs.  To see if you will be able to move in your home with a wheeled walker, hold your hands out about 6 inches (15 cm) from your sides. Walk from your recovery spot to your kitchen and bathroom. Then walk from your bed to the bathroom. If you do not hit anything with your hands, you have enough room.  Move the items you use most often in your kitchen, bathroom, and bedroom to shelves and drawers that are at countertop height.  Prepare a few meals to freeze and reheat later.  Consider adding grab bars in the shower and near the toilet.  While you are in the hospital, you will learn about equipment that can be helpful for your recovery. Some of the equipment includes raised toilet seats, tub benches, and shower benches. HOW SHOULD I PREPARE MY BODY?  Have a preoperative exam. This will ensure that your body is healthy enough to safely have this surgery. Bring a complete list of all your medicines and supplements, including herbs and vitamins. You may need to have additional tests to ensure your safety.  Have elective dental care and routine cleanings before your surgery. Germs from anywhere in your body, including your mouth, can travel to your new joint and infect it. It is important not to have any dental work performed for at least 3 months after your surgery. After surgery, be sure to tell your dentist about your joint replacement.  Maintain a  healthy diet. Do not change your diet before surgery unless advised to do so by your health care provider.  Do not use any tobacco products, including cigarettes, chewing tobacco, or electronic cigarettes. If you need help quitting, ask your health care provider. Tobacco and nicotine products can delay healing after your surgery.  The day before your surgery, follow your health care provider's directions for showering, eating, drinking, and taking medicines. These directions are for your safety. WHAT KINDS OF EXERCISES SHOULD I DO?  Your health care provider may have you do the following exercises before your surgery. Be sure to follow the exercise program only as directed by your health care provider. While completing these exercises, remember to stretch for as long as you can, up to 30 seconds. You should only feel a gentle lengthening or release in the stretched tissue. You should not feel pain.  Ankle Pumps 1. While sitting on a firm surface with your legs straight out in front of you, move your feet at the ankle joints so that your toes are pulling back toward your chest. 2. Reverse the motion, pointing your toes away from you. 3. Repeat 10-20 times. Complete this exercise 1-2 times per day.  Heel Slides 1. Lie on your back with both knees straight. (If this causes back pain, bend one knee, placing your foot flat on the floor. Keep this leg in this position while doing heel slides with the opposite leg.) 2. Slowly slide one heel back toward your buttocks until you feel a gentle stretch in the front of your knee or thigh. 3. Slowly slide your heel back to the starting position. 4. Repeat 10-20 times, then switch heels and do it again. Complete this exercise 1-2 times per day. Quadriceps Sets 1. Lie on your back with one leg extended and your opposite knee bent. 2. Gradually tighten the muscles in the front of the thigh of your extended leg. This motion will push the back of the knee down  toward the floor. 3. Hold the muscle as tightly as you can without causing pain for 10 seconds. 4. Relax the muscles slowly and completely in between each repetition. 5. Repeat 10-20 times, then switch legs and do it again. Complete this exercise 1-2 times per day. Short Arc Kicks  1. Lie on your back. Place a rolled towel (4-6 inches [10-15 cm] high) under one knee so that the knee slightly bends. 2. Raise only the lower leg of your slightly bent leg by tightening the muscles in the front of your thigh. Do not allow your thigh to rise. 3. Hold this position for 5 seconds. 4. Repeat 10-20 times, then switch legs and do it again. Complete this exercise 1-2 times per day. Straight Leg Raises 1. Lie on your back with one leg extended and your opposite knee bent. 2. Tighten the muscles in the front of the  thigh of your extended leg. Your thigh may shake slightly.  3. Tighten these muscles even more and raise your leg 4-6 inches off the floor. Hold for 3-5 seconds. 4. Keeping these muscles tight, lower your leg. 5. Relax the muscles slowly and completely in between each repetition. 6. Repeat 10-20 times, then switch legs and do it again. Complete this exercise 1-2 times per day.  Arm Chair Push-ups 1. Find a firm, non-wheeled chair with solid armrests. 2. Sitting in the chair, extend one leg straight out in front of you. 3. Lift up your body weight, using your arms and opposite leg. 4. Slowly lower your body weight. 5. Repeat 10-20 times, then switch legs and do it again. Complete this exercise 1-2 times per day.   This information is not intended to replace advice given to you by your health care provider. Make sure you discuss any questions you have with your health care provider.   Document Released: 09/24/2010 Document Revised: 07/11/2014 Document Reviewed: 09/12/2013 Elsevier Interactive Patient Education 2016 [employer].    Hypertension Hypertension, commonly called high blood  pressure, is when the force of blood pumping through your arteries is too strong. Your arteries are the blood vessels that carry blood from your heart throughout your body. A blood pressure reading consists of a higher number over a lower number, such as 110/72. The higher number (systolic) is the pressure inside your arteries when your heart pumps. The lower number (diastolic) is the pressure inside your arteries when your heart relaxes. Ideally you want your blood pressure below 120/80. Hypertension forces your heart to work harder to pump blood. Your arteries may become narrow or stiff. Having untreated or uncontrolled hypertension can cause heart attack, stroke, kidney disease, and other problems. RISK FACTORS Some risk factors for high blood pressure are controllable. Others are not.  Risk factors you cannot control include:   Race. You may be at higher risk if you are African American.  Age. Risk increases with age.  Gender. Men are at higher risk than women before age 52 years. After age 82, women are at higher risk than men. Risk factors you can control include:  Not getting enough exercise or physical activity.  Being overweight.  Getting too much fat, sugar, calories, or salt in your diet.  Drinking too much alcohol. SIGNS AND SYMPTOMS Hypertension does not usually cause signs or symptoms. Extremely high blood pressure (hypertensive crisis) may cause headache, anxiety, shortness of breath, and nosebleed. DIAGNOSIS To check if you have hypertension, your health care provider will measure your blood pressure while you are seated, with your arm held at the level of your heart. It should be measured at least twice using the same arm. Certain conditions can cause a difference in blood pressure between your right and left arms. A blood pressure reading that is higher than normal on one occasion does not mean that you need treatment. If it is not clear whether you have high blood pressure,  you may be asked to return on a different day to have your blood pressure checked again. Or, you may be asked to monitor your blood pressure at home for 1 or more weeks. TREATMENT Treating high blood pressure includes making lifestyle changes and possibly taking medicine. Living a healthy lifestyle can help lower high blood pressure. You may need to change some of your habits. Lifestyle changes may include:  Following the DASH diet. This diet is high in fruits, vegetables, and whole grains. It is  low in salt, red meat, and added sugars.  Keep your sodium intake below 2,300 mg per day.  Getting at least 30-45 minutes of aerobic exercise at least 4 times per week.  Losing weight if necessary.  Not smoking.  Limiting alcoholic beverages.  Learning ways to reduce stress. Your health care provider may prescribe medicine if lifestyle changes are not enough to get your blood pressure under control, and if one of the following is true:  You are 48-41 years of age and your systolic blood pressure is above 140.  You are 56 years of age or older, and your systolic blood pressure is above 150.  Your diastolic blood pressure is above 90.  You have diabetes, and your systolic blood pressure is over [id number] or your diastolic blood pressure is over 90.  You have kidney disease and your blood pressure is above 140/90.  You have heart disease and your blood pressure is above 140/90. Your personal target blood pressure may vary depending on your medical conditions, your age, and other factors. HOME CARE INSTRUCTIONS  Have your blood pressure rechecked as directed by your health care provider.   Take medicines only as directed by your health care provider. Follow the directions carefully. Blood pressure medicines must be taken as prescribed. The medicine does not work as well when you skip doses. Skipping doses also puts you at risk for problems.  Do not smoke.   Monitor your blood pressure at home  as directed by your health care provider. SEEK MEDICAL CARE IF:   You think you are having a reaction to medicines taken.  You have recurrent headaches or feel dizzy.  You have swelling in your ankles.  You have trouble with your vision. SEEK IMMEDIATE MEDICAL CARE IF:  You develop a severe headache or confusion.  You have unusual weakness, numbness, or feel faint.  You have severe chest or abdominal pain.  You vomit repeatedly.  You have trouble breathing. MAKE SURE YOU:   Understand these instructions.  Will watch your condition.  Will get help right away if you are not doing well or get worse.   This information is not intended to replace advice given to you by your health care provider. Make sure you discuss any questions you have with your health care provider.   Document Released: 06/20/2005 Document Revised: 11/04/2014 Document Reviewed: 04/12/2013 Elsevier Interactive Patient Education [employer].

## 2016-04-11 ENCOUNTER — Ambulatory Visit: Payer: No Typology Code available for payment source | Admitting: Physical Therapy

## 2016-04-11 DIAGNOSIS — M1612 Unilateral primary osteoarthritis, left hip: Secondary | ICD-10-CM

## 2016-04-11 DIAGNOSIS — R531 Weakness: Secondary | ICD-10-CM

## 2016-04-11 DIAGNOSIS — M25552 Pain in left hip: Secondary | ICD-10-CM

## 2016-04-11 DIAGNOSIS — M6281 Muscle weakness (generalized): Secondary | ICD-10-CM

## 2016-04-11 NOTE — Therapy (Signed)
[place], [place], [address] Phone: [phone]   Fax:  [phone]  Physical Therapy Treatment  Patient Details  Name: KAROLYNE BUECHEL MRN: [id number] Date of Birth: 1956-07-01 Referring Provider: Andrena Mews, MD  Encounter Date: 04/11/2016      PT End of Session - 04/11/16 1322    Visit Number 5   Number of Visits 8   Date for PT Re-Evaluation 04/22/16   PT Start Time [id number]   PT Stop Time 1245   PT Time Calculation (min) 58 min   Activity Tolerance Patient tolerated treatment well   Behavior During Therapy [facility] for tasks assessed/performed      Past Medical History:  Diagnosis Date  . Allergy   . Anemia   . Anxiety   . Arthritis   . Back pain, chronic   . Depression   . GERD (gastroesophageal reflux disease)   . Hyperlipidemia   . Hypertension   . Obesity   . 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 Seizures ([place])   . Somatization disorder   . Tobacco abuse     Past Surgical History:  Procedure Laterality Date  . ABDOMINAL HYSTERECTOMY    . CHOLECYSTECTOMY    . kidney stent Right 2002  . TONSILLECTOMY Bilateral 12/06/2014   Procedure: TONSILLECTOMY, BILATERAL;  Surgeon: Ruby Cola, MD;  Location: [facility];  Service: ENT;  Laterality: Bilateral;  . TUBAL LIGATION      There were no vitals filed for this visit.      Subjective Assessment - 04/11/16 1148    Subjective 8/10.  Hurt too much last week, missed an appointment due to pain. Saw the MD she is being referred to an Ortho MD.  [facility].     Pain Score 8    Pain Location Hip   Pain Orientation Left   Pain Descriptors / Indicators Sharp;Cramping   Aggravating Factors  laying on left side, weightbearing,     Pain Relieving Factors cold , rest, rolling walker    Effect of Pain on Daily Activities Walking.    Multiple Pain Sites No                         [facility] Adult [facility]  Treatment/Exercise - 04/11/16 0001      Lumbar Exercises: Stretches   Quad Stretch 5 reps;20 seconds  sitting at edge of mat.  Groin stretch 3 X 30     Lumbar Exercises: Supine   Bridge 10 reps   Bridge Limitations small height over bolster   Other Supine Lumbar Exercises 4 way hip isometrice 10 X each 5 second holds.      Moist Heat Therapy   Number Minutes Moist Heat 10 Minutes   Moist Heat Location Hip  left, 2 packs anterior , lateral     Manual Therapy   Manual Therapy --  manual traction with movement X5   Manual therapy comments * pattern kinesiotex tape Left for pain                     PT Long Term Goals - 04/11/16 1325      PT LONG TERM GOAL #1   Title The patient will be indep with HEP for flexibility, postural stabilization, and strengthening.   Baseline unable to exercise last week , too painful.   Time 4   Status On-going  PT LONG TERM GOAL #2   Title Independent with all HEP issued    Time 4   Period Weeks   Status On-going     PT LONG TERM GOAL #3   Title Report of pain decreased 50% or more with weight bearing activity   Baseline 8/10   Time 4   Period Weeks   Status On-going     PT LONG TERM GOAL #4   Title She will be able to sit for 45-60 min without incr pain   Time 4   Status Unable to assess     PT LONG TERM GOAL #5   Title Decrease neck disability index from 22% to < or equal to 14%.   Time 4   Period Weeks   Status Unable to assess               Plan - 04/11/16 1322    Clinical Impression Statement Pain flare last week.  Focus today on pain relief , gentle stretches.  Pain lateral hip vs distal thigh.  RT hip ASIS higher.   PT Next Visit Plan assess tape,  check leg length,  gentle stretches strengthening.   Consulted and Agree with Plan of Care Patient      Patient will benefit from skilled therapeutic intervention in order to improve the following deficits and impairments:  Decreased range of motion,  Increased muscle spasms, Pain, Decreased strength, Decreased activity tolerance, Obesity  Visit Diagnosis: Osteoarthritis of left hip, unspecified osteoarthritis type  Pain in left hip  Muscle weakness (generalized)  Generalized weakness  Hip pain, left     Problem List Patient Active Problem List   Diagnosis Date Noted  . History of adenomatous polyp of colon 03/21/2016  . Prediabetes 01/21/2016  . Thyroid nodule 12/25/2014  . ARDS (adult respiratory distress syndrome) ([place])   . Hypoxia   . Infiltrate noted on imaging study   . Acute respiratory failure with hypoxia ([place])   . Pharyngeal swelling   . Tonsillar abscess 12/06/2014  . Tonsil, abscess 12/06/2014  . Cervical spine arthritis ([place]) 11/14/2014  . Osteoarthritis of hip 11/14/2014  . Loss of consciousness ([place]) 11/14/2014  . Cough 05/26/2014  . Musculoskeletal chest and rib pain 03/26/2014  . Rib pain on right side 02/28/2014  . Groin pain 04/18/2013  . Tension headache 12/22/2012  . UNSPECIFIED RENAL SCLEROSIS 01/16/2009  . GERD, SEVERE 05/25/2007  . OBESITY NOS 04/03/2007  . HYPERLIPIDEMIA 08/31/2006  . Major depression, recurrent ([place]) 08/31/2006  . 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 Former smoker 08/31/2006  . HYPERTENSION, BENIGN SYSTEMIC 08/31/2006  . GASTRIC ULCER ACUTE WITHOUT HEMORRHAGE 08/31/2006  . CONVULSIONS, SEIZURES, NOS 08/31/2006    HARRIS,KAREN PTA 04/11/2016, 6:21 PM  [facility] [address] [place], [place], [address] Phone: [phone]   Fax:  [phone]  Name: CHELESA BRANNUM MRN: [id number] Date of Birth: Mar 13, 1956

## 2016-04-11 NOTE — Patient Instructions (Signed)
Remove tape if irritating 

## 2016-04-14 ENCOUNTER — Ambulatory Visit: Payer: No Typology Code available for payment source

## 2016-04-14 DIAGNOSIS — M1612 Unilateral primary osteoarthritis, left hip: Secondary | ICD-10-CM

## 2016-04-14 DIAGNOSIS — M25552 Pain in left hip: Secondary | ICD-10-CM

## 2016-04-14 DIAGNOSIS — M6281 Muscle weakness (generalized): Secondary | ICD-10-CM

## 2016-04-14 DIAGNOSIS — R531 Weakness: Secondary | ICD-10-CM

## 2016-04-14 NOTE — Therapy (Addendum)
[place], [place], [address] Phone: [phone]   Fax:  [phone]  Physical Therapy Treatment/Discharge  Patient Details  Name: Kristin [name] MRN: [id number] Date of Birth: 1956-02-12 Referring Provider: Andrena Mews, MD  Encounter Date: 04/14/2016      PT End of Session - 04/14/16 1018    Visit Number 6   Number of Visits 8   Date for PT Re-Evaluation 04/22/16   Authorization Type GCCN    PT Start Time 1020   PT Stop Time 1120   PT Time Calculation (min) 60 min   Activity Tolerance Patient tolerated treatment well;Patient limited by pain   Behavior During Therapy [facility] for tasks assessed/performed      Past Medical History:  Diagnosis Date  . Allergy   . Anemia   . Anxiety   . Arthritis   . Back pain, chronic   . Depression   . GERD (gastroesophageal reflux disease)   . Hyperlipidemia   . Hypertension   . Obesity   . 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 Seizures ([place])   . Somatization disorder   . Tobacco abuse     Past Surgical History:  Procedure Laterality Date  . ABDOMINAL HYSTERECTOMY    . CHOLECYSTECTOMY    . kidney stent Right 2002  . TONSILLECTOMY Bilateral 12/06/2014   Procedure: TONSILLECTOMY, BILATERAL;  Surgeon: Ruby Cola, MD;  Location: [facility];  Service: ENT;  Laterality: Bilateral;  . TUBAL LIGATION      There were no vitals filed for this visit.      Subjective Assessment - 04/14/16 1021    Subjective Doing HEP . No better. Going to see Ortho MD/.    Currently in Pain? Yes   Pain Score 7    Pain Location Hip   Pain Orientation Left   Pain Descriptors / Indicators Sharp   Pain Type Chronic pain   Pain Onset More than a month ago   Pain Frequency Constant   Aggravating Factors  walking , lying on LTside   Pain Relieving Factors cold rest , using walker   Multiple Pain Sites No                                       PT Long Term Goals - 04/14/16 1045      PT LONG TERM GOAL #1   Title The patient will be indep with HEP for flexibility, postural stabilization, and strengthening.   Status Achieved     PT LONG TERM GOAL #2   Title Independent with all HEP issued    Status Partially Met     PT LONG TERM GOAL #3   Title Report of pain decreased 50% or more with weight bearing activity   Status On-going     PT LONG TERM GOAL #4   Title She will be able to sit for 45-60 min without incr pain   Status On-going     PT LONG TERM GOAL #5   Title Decrease neck disability index from 22% to < or equal to 14%.   Status Unable to assess               Plan - 04/14/16 1018    Clinical Impression Statement No improvement and she has Ortho MD appointment in near future. She is doing HEP but is  limited when pain higher. She agreed she would like to see what MD says so we will hold until after MD visit. This visit was scheduled for follow up   PT Treatment/Interventions Cryotherapy;Electrical Stimulation;Iontophoresis 33m/ml Dexamethasone;Moist Heat;Traction;Ultrasound;Passive range of motion;Patient/family education;Taping;Manual techniques;Therapeutic exercise;Dry needling   PT Next Visit Plan Reassess if MD returns  to PT. Discharge if MD decides on different couse of action.  Reassess neck   Consulted and Agree with Plan of Care Patient      Patient will benefit from skilled therapeutic intervention in order to improve the following deficits and impairments:  Decreased range of motion, Increased muscle spasms, Pain, Decreased strength, Decreased activity tolerance, Obesity  Visit Diagnosis: Osteoarthritis of left hip, unspecified osteoarthritis type  Pain in left hip  Muscle weakness (generalized)  Generalized weakness  Hip pain, left     Problem List Patient Active Problem List   Diagnosis Date Noted  . History of  adenomatous polyp of colon 03/21/2016  . Prediabetes 01/21/2016  . Thyroid nodule 12/25/2014  . ARDS (adult respiratory distress syndrome) ([place]   . Hypoxia   . Infiltrate noted on imaging study   . Acute respiratory failure with hypoxia ([place]   . Pharyngeal swelling   . Tonsillar abscess 12/06/2014  . Tonsil, abscess 12/06/2014  . Cervical spine arthritis ([place] 11/14/2014  . Osteoarthritis of hip 11/14/2014  . Loss of consciousness ([place] 11/14/2014  . Cough 05/26/2014  . Musculoskeletal chest and rib pain 03/26/2014  . Rib pain on right side 02/28/2014  . Groin pain 04/18/2013  . Tension headache 12/22/2012  . UNSPECIFIED RENAL SCLEROSIS 01/16/2009  . GERD, SEVERE 05/25/2007  . OBESITY NOS 04/03/2007  . HYPERLIPIDEMIA 08/31/2006  . Major depression, recurrent ([place] 08/31/2006  . 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 Former smoker 08/31/2006  . HYPERTENSION, BENIGN SYSTEMIC 08/31/2006  . GASTRIC ULCER ACUTE WITHOUT HEMORRHAGE 08/31/2006  . CONVULSIONS, SEIZURES, NOS 08/31/2006    CDarrel Hoover  PT 04/14/2016, 10:47 AM  [facility] [place] [address]Phone: [phone]  Fax:  [phone] Name: Kristin BOLDINGMRN: [id number] of Birth: 102-09-1955  PHYSICAL THERAPY DISCHARGE SUMMARY  Visits from Start of Care: 6  Current functional level related to goals / functional outcomes: See Above . On reviewing the chart she is scheduled to have a THA in near future   Remaining deficits: See above   Education / Equipment: HEP Plan: Patient agrees to discharge.  Patient goals were not met. Patient is being discharged due to a change in medical status.  ?????   SLillette BoxerChasse   PT    05/20/16           7:33 AM

## 2016-04-15 ENCOUNTER — Other Ambulatory Visit: Payer: Self-pay | Admitting: Family Medicine

## 2016-04-18 NOTE — Telephone Encounter (Signed)
2nd request.  Allex Lapoint L, RN  

## 2016-04-26 ENCOUNTER — Encounter (INDEPENDENT_AMBULATORY_CARE_PROVIDER_SITE_OTHER): Payer: Self-pay | Admitting: Orthopaedic Surgery

## 2016-04-26 ENCOUNTER — Ambulatory Visit (INDEPENDENT_AMBULATORY_CARE_PROVIDER_SITE_OTHER): Payer: Self-pay | Admitting: Orthopaedic Surgery

## 2016-04-26 DIAGNOSIS — M1612 Unilateral primary osteoarthritis, left hip: Secondary | ICD-10-CM

## 2016-04-26 NOTE — Progress Notes (Signed)
Office Visit Note   Patient: Kristin [name]           Date of Birth: February 23, 1956           MRN: [id number] Visit Date: 04/26/2016              Requested by: Isaias Sakai, DO [address] [place], [place] [address] PCP: Smiley Houseman, MD   Assessment & Plan: Visit Diagnoses:  1. Primary osteoarthritis of left hip     Plan:  - xrays severe advanced DJD left hip - reviewed with patient - discussed nonop vs THA and associated r/b/a - she wishes to proceed with surgery - she understands that she is at slightly higher risk for complications given BMI 45 however she does not have the usual associated risk factors of DM, smoking - surgery will be at least 6 weeks from previous steroid injection  Follow-Up Instructions: Return for needs 2 week postop visit.   Orders:  No orders of the defined types were placed in this encounter.  No orders of the defined types were placed in this encounter.     Procedures: No procedures performed   Clinical Data: No additional findings.   Subjective: Chief Complaint  Patient presents with  . Left Hip - Pain    60 yo obese female with constant left hip pain, 8/10, for 1 year, denies injuries or radiation of pain.  Pain is located deep within hip and groin region.  Denies back pain, f/c/n/v.  Pain is a burning and throbbing pain.  PT and naproxen have not helped.  Quality of life has severely declined.    Review of Systems  Constitutional: Negative.   HENT: Negative.   Eyes: Negative.   Respiratory: Negative.   Cardiovascular: Negative.   Endocrine: Negative.   Musculoskeletal: Negative.   Neurological: Negative.   Hematological: Negative.   Psychiatric/Behavioral: Negative.   All other systems reviewed and are negative.    Objective: Vital Signs: There were no vitals taken for this visit.  Physical Exam  Constitutional: She is oriented to person, place, and time. She appears well-developed and  well-nourished.  HENT:  Head: Atraumatic.  Eyes: EOM are normal.  Neck: Neck supple.  Cardiovascular: Intact distal pulses.   Pulmonary/Chest: Effort normal.  Abdominal: Soft.  Neurological: She is alert and oriented to person, place, and time.  Skin: Skin is warm. Capillary refill takes less than 2 seconds.  Psychiatric: She has a normal mood and affect. Her behavior is normal. Judgment and thought content normal.  Nursing note and vitals reviewed.   Left Hip Exam   Tenderness  The patient is experiencing no tenderness.     Muscle Strength  Flexion: 4/5   Comments:  + stinchfied sign, +IR/ER hip with pain, no radic, no sciatic tension Her body habitus is quite conducive to the surgery despite the increased BMI.  She has more truncal obesity.      Specialty Comments:  No specialty comments available.  Imaging: No results found.   PMFS History: Patient Active Problem List   Diagnosis Date Noted  . History of adenomatous polyp of colon 03/21/2016  . Prediabetes 01/21/2016  . Thyroid nodule 12/25/2014  . ARDS (adult respiratory distress syndrome) ([place])   . Hypoxia   . Infiltrate noted on imaging study   . Acute respiratory failure with hypoxia ([place])   . Pharyngeal swelling   . Tonsillar abscess 12/06/2014  . Tonsil, abscess 12/06/2014  . Cervical spine  arthritis ([place]) 11/14/2014  . Osteoarthritis of hip 11/14/2014  . Loss of consciousness ([place]) 11/14/2014  . Cough 05/26/2014  . Musculoskeletal chest and rib pain 03/26/2014  . Rib pain on right side 02/28/2014  . Groin pain 04/18/2013  . Tension headache 12/22/2012  . UNSPECIFIED RENAL SCLEROSIS 01/16/2009  . GERD, SEVERE 05/25/2007  . OBESITY NOS 04/03/2007  . HYPERLIPIDEMIA 08/31/2006  . Major depression, recurrent ([place]) 08/31/2006  . 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 Former smoker 08/31/2006  . HYPERTENSION, BENIGN SYSTEMIC 08/31/2006  . GASTRIC ULCER ACUTE WITHOUT HEMORRHAGE 08/31/2006  .  CONVULSIONS, SEIZURES, NOS 08/31/2006   Past Medical History:  Diagnosis Date  . Allergy   . Anemia   . Anxiety   . Arthritis   . Back pain, chronic   . Depression   . GERD (gastroesophageal reflux disease)   . Hyperlipidemia   . Hypertension   . Obesity   . 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 Seizures ([place])   . Somatization disorder   . Tobacco abuse     Family History  Problem Relation Age of Onset  . Other Mother     failed surgery  . Heart Problems Father     poss MI, not sure:per pt  . Alzheimer's disease Maternal Grandmother   . Heart disease Maternal Grandfather   . Colon cancer Maternal Uncle     not sure age of onset    Past Surgical History:  Procedure Laterality Date  . ABDOMINAL HYSTERECTOMY    . CHOLECYSTECTOMY    . kidney stent Right 2002  . TONSILLECTOMY Bilateral 12/06/2014   Procedure: TONSILLECTOMY, BILATERAL;  Surgeon: Ruby Cola, MD;  Location: [facility];  Service: ENT;  Laterality: Bilateral;  . TUBAL LIGATION     Social History   Occupational History  . Not on file.   Social History Main Topics  . Smoking status: Former Smoker    Packs/day: 0.25    Years: 0.50    Types: Cigarettes    Quit date: 01/27/2014  . Smokeless tobacco: Never Used  . Alcohol use No  . Drug use: No  . Sexual activity: Not on file

## 2016-04-27 ENCOUNTER — Ambulatory Visit: Payer: No Typology Code available for payment source

## 2016-05-03 ENCOUNTER — Encounter: Payer: Self-pay | Admitting: Internal Medicine

## 2016-05-03 ENCOUNTER — Ambulatory Visit (INDEPENDENT_AMBULATORY_CARE_PROVIDER_SITE_OTHER): Payer: No Typology Code available for payment source | Admitting: Internal Medicine

## 2016-05-03 VITALS — BP 159/89 | HR 105 | Temp 98.4°F | Wt 254.4 lb

## 2016-05-03 DIAGNOSIS — J040 Acute laryngitis: Secondary | ICD-10-CM

## 2016-05-03 DIAGNOSIS — R7303 Prediabetes: Secondary | ICD-10-CM

## 2016-05-03 DIAGNOSIS — J209 Acute bronchitis, unspecified: Secondary | ICD-10-CM

## 2016-05-03 LAB — POCT GLYCOSYLATED HEMOGLOBIN (HGB A1C): HEMOGLOBIN A1C: 6.5

## 2016-05-03 MED ORDER — BENZONATATE 100 MG PO CAPS: 100.0000 mg | ORAL_CAPSULE | Freq: Three times a day (TID) | ORAL | 0 refills | Status: DC | PRN

## 2016-05-03 NOTE — Patient Instructions (Signed)
Follow up in about 2 weeks for blood pressure  If your symptoms worsen or if you develop fevers, please return to clinic.

## 2016-05-03 NOTE — Progress Notes (Signed)
   [facility] Phone: [phone]   Date of Visit: 05/03/2016   HPI: Kristin [name] is a 60 y.o. female presenting to clinic today for same day appointment. PCP: Smiley Houseman, MD Concerns today include:  Cough: - losing voice: since thursday - cough: started Wednesday and getting worse. Dry cough. Some chest congestion  - no shortness of breath  - some nasal congestion; no rhinorrhea, sore throat, dysphagia, sinus pain or ear pain  - no fevers/chills  - no sick contacts  - normal PO intake - has tried dayquil, nayquil, alkasetzer for sinus, cough drops without much hlep - no post-tussive emesis  - received flu shot  ROS: See HPI.  [place]:  Hx of tonsillar abscess  PHYSICAL EXAM: BP (!) 159/89 (BP Location: Right Arm, Cuff Size: Normal)   Pulse (!) 105   Temp 98.4 F (36.9 C) (Oral)   Wt 254 lb 6.4 oz (115.4 kg)   BMI 46.53 kg/m  GEN: NAD, non-toxic appearing, intermittent coughing  HEENT: Atraumatic, normocephalic, neck supple, EOMI, sclera clear. No sinus tenderness. Nasal turbinates normal. Pharynx with only some mild erythema and no exudate.  Neck: no cervical lymphadenopathy  CV: mild tachycardia, normal S1 and S2, no murmurs, rubs, or gallops PULM: CTAB, normal effort SKIN: No rash or cyanosis; warm and well-perfused EXTR: No lower extremity edema or calf tenderness  ASSESSMENT/PLAN:  Acute Bronchitis/Laryngitis:  Likely viral in etiology. Patient is non-toxic appearing. No respiratory distress noted. Currently no indication for CXR and very un-likley this is PNA.  - Tessalon PRN for cough - discussed return precautions   HTN: Of note, elevated BP at initial check with improvement at repeat but still not at goal.  - no changes today in the setting of acute illness  - continue current medications - make nurse visit in 1 week for BP check  - follow up with me in 2 weeks for HTN   New Diagnosis of DM2: has a history of  pre-diabetes. A1c of 6.5 today - will discuss at 2 week follow up      Smiley Houseman, MD PGY [place]

## 2016-05-10 ENCOUNTER — Ambulatory Visit (INDEPENDENT_AMBULATORY_CARE_PROVIDER_SITE_OTHER): Payer: No Typology Code available for payment source | Admitting: *Deleted

## 2016-05-10 VITALS — BP 152/100 | HR 107

## 2016-05-10 DIAGNOSIS — I10 Essential (primary) hypertension: Secondary | ICD-10-CM

## 2016-05-10 DIAGNOSIS — Z0130 Encounter for examination of blood pressure without abnormal findings: Secondary | ICD-10-CM

## 2016-05-10 NOTE — Progress Notes (Signed)
   Patient in nurse clinic for blood pressure check.  Patient reported left hip pain, due to have hip replacement in of the month.  Patient denies chest pain, headache, dizziness, SOB or visual changes.  Patient reported she is taking her blood pressure medications as prescribed.  Will forward to PCP.  Derl Barrow, RN   Today's Vitals   05/10/16 1408 05/10/16 1411  BP: (!) 160/100 (!) 152/100  Pulse: (!) 113 (!) 107  SpO2: 98%   PainSc: 6  6   PainLoc: Hip

## 2016-05-18 ENCOUNTER — Other Ambulatory Visit: Payer: Self-pay | Admitting: Internal Medicine

## 2016-05-18 NOTE — Progress Notes (Deleted)
   [facility] Phone: [phone]   Date of Visit: 05/19/2016   HPI:  HTN: - Medications:   ROS: See HPI.  [place]:  HTN GERD Hx of Gastric ulcer Hx of Thyroid Nodule OA of hip Atrophic R Kidney Obesity Depression Hx of Tobacco Use Hx Tension HA  PHYSICAL EXAM: There were no vitals taken for this visit. Gen: *** HEENT: *** Heart: *** Lungs: *** Neuro: *** Ext: ***  ASSESSMENT/PLAN:  Health maintenance:  -***  No problem-specific Assessment & Plan notes found for this encounter.  FOLLOW UP: Follow up in *** for ***  Smiley Houseman, MD PGY [place]

## 2016-05-19 ENCOUNTER — Ambulatory Visit: Payer: No Typology Code available for payment source | Admitting: Internal Medicine

## 2016-05-19 DIAGNOSIS — R000 Tachycardia, unspecified: Secondary | ICD-10-CM | POA: Insufficient documentation

## 2016-05-20 ENCOUNTER — Other Ambulatory Visit (INDEPENDENT_AMBULATORY_CARE_PROVIDER_SITE_OTHER): Payer: Self-pay | Admitting: Orthopaedic Surgery

## 2016-05-20 ENCOUNTER — Telehealth: Payer: Self-pay | Admitting: Internal Medicine

## 2016-05-20 DIAGNOSIS — M1612 Unilateral primary osteoarthritis, left hip: Secondary | ICD-10-CM

## 2016-05-20 MED ORDER — NAPROXEN 500 MG PO TABS
ORAL_TABLET | ORAL | 0 refills | Status: DC
Start: 2016-05-20 — End: 2016-09-08

## 2016-05-20 NOTE — Pre-Procedure Instructions (Signed)
    ONA TELLEFSEN  05/20/2016      [place] [address] Phone: [phone] Fax: [place], [place] [address] Phone: [phone] Fax: [phone]  [place], [place] - [address] [place] [address] Phone: [phone] Fax: [place], [place] - [place] [address] [place] [address] Phone: [phone] Fax: [phone]    Your procedure is scheduled on 06/01/16.  Report to [facility] Admitting at 1030 A.M.  Call this number if you have problems the morning of surgery:  [phone]   Remember:  Do not eat food or drink liquids after midnight.  Take these medicines the morning of surgery with A SIP OF WATER --metoprolol,prilosec   Do not wear jewelry, make-up or nail polish.  Do not wear lotions, powders, or perfumes, or deoderant.  Do not shave 48 hours prior to surgery.  Men may shave face and neck.  Do not bring valuables to the hospital.  [facility] is not responsible for any belongings or valuables.  Contacts, dentures or bridgework may not be worn into surgery.  Leave your suitcase in the car.  After surgery it may be brought to your room.  For patients admitted to the hospital, discharge time will be determined by your treatment team.  Patients discharged the day of surgery will not be allowed to drive home.   Name and phone number of your driver:    Special instructions:  Do not take any aspirin,anti-inflammatories,vitamins,or herbal supplements 5-7 days prior to surgery.  Please read over the following fact sheets that you were given. MRSA Information

## 2016-05-20 NOTE — Telephone Encounter (Signed)
Pt is calling and would like to check the status of her request for Naproxen. jw

## 2016-05-20 NOTE — Telephone Encounter (Signed)
Rx for naproxen sent

## 2016-05-20 NOTE — Telephone Encounter (Signed)
Spoke with patient and she is aware that provider wants to see her for an appointment first.  Patient has an appointment on 05-25-16 with PCP but doesn't think that she will be able to wait until then with the pain.  Will forward to MD to see if she can enough medication to last until this appointment. Jazmin Hartsell,CMA

## 2016-05-23 ENCOUNTER — Ambulatory Visit (HOSPITAL_COMMUNITY)
Admission: RE | Admit: 2016-05-23 | Discharge: 2016-05-23 | Disposition: A | Discharge status: Home / Self Care | Payer: Self-pay | Source: Ambulatory Visit | Attending: Orthopaedic Surgery | Admitting: Orthopaedic Surgery

## 2016-05-23 ENCOUNTER — Encounter (HOSPITAL_COMMUNITY)
Admission: RE | Admit: 2016-05-23 | Discharge: 2016-05-23 | Disposition: A | Discharge status: Home / Self Care | Payer: Self-pay | Source: Ambulatory Visit | Attending: Orthopaedic Surgery | Admitting: Orthopaedic Surgery

## 2016-05-23 ENCOUNTER — Encounter (HOSPITAL_COMMUNITY): Payer: Self-pay

## 2016-05-23 ENCOUNTER — Other Ambulatory Visit: Payer: Self-pay

## 2016-05-23 DIAGNOSIS — R059 Cough, unspecified: Secondary | ICD-10-CM

## 2016-05-23 DIAGNOSIS — M1612 Unilateral primary osteoarthritis, left hip: Secondary | ICD-10-CM | POA: Insufficient documentation

## 2016-05-23 DIAGNOSIS — R05 Cough: Secondary | ICD-10-CM

## 2016-05-23 DIAGNOSIS — Z01812 Encounter for preprocedural laboratory examination: Secondary | ICD-10-CM | POA: Insufficient documentation

## 2016-05-23 DIAGNOSIS — Z01818 Encounter for other preprocedural examination: Secondary | ICD-10-CM | POA: Insufficient documentation

## 2016-05-23 HISTORY — DX: Cough: R05

## 2016-05-23 HISTORY — DX: Cough, unspecified: R05.9

## 2016-05-23 HISTORY — DX: Bronchitis, not specified as acute or chronic: J40

## 2016-05-23 LAB — COMPREHENSIVE METABOLIC PANEL
ALBUMIN: 4.3 g/dL (ref 3.5–5.0)
ALK PHOS: 112 U/L (ref 38–126)
ALT: 17 U/L (ref 14–54)
ANION GAP: 9 (ref 5–15)
AST: 24 U/L (ref 15–41)
BUN: 14 mg/dL (ref 6–20)
CALCIUM: 9.8 mg/dL (ref 8.9–10.3)
CO2: 30 mmol/L (ref 22–32)
CREATININE: 1.06 mg/dL — AB (ref 0.44–1.00)
Chloride: 100 mmol/L — ABNORMAL LOW (ref 101–111)
GFR calc Af Amer: >60 mL/min (ref >60)
GFR calc non Af Amer: 56 mL/min — ABNORMAL LOW (ref >60)
GLUCOSE: 117 mg/dL — AB (ref 65–99)
Potassium: 3.3 mmol/L — ABNORMAL LOW (ref 3.5–5.1)
SODIUM: 139 mmol/L (ref 135–145)
Total Bilirubin: 0.5 mg/dL (ref 0.3–1.2)
Total Protein: 7.2 g/dL (ref 6.5–8.1)

## 2016-05-23 LAB — ABO/RH: ABO/RH(D): A POS

## 2016-05-23 LAB — URINE MICROSCOPIC-ADD ON

## 2016-05-23 LAB — CBC WITH DIFFERENTIAL/PLATELET
BASOS ABS: 0.1 10*3/uL (ref 0.0–0.1)
BASOS PCT: 1 %
EOS ABS: 0.2 10*3/uL (ref 0.0–0.7)
Eosinophils Relative: 5 %
HCT: 37.4 % (ref 36.0–46.0)
HEMOGLOBIN: 12 g/dL (ref 12.0–15.0)
Lymphocytes Relative: 49 %
Lymphs Abs: 2.1 10*3/uL (ref 0.7–4.0)
MCH: 29.3 pg (ref 26.0–34.0)
MCHC: 32.1 g/dL (ref 30.0–36.0)
MCV: 91.2 fL (ref 78.0–100.0)
Monocytes Absolute: 0.3 10*3/uL (ref 0.1–1.0)
Monocytes Relative: 6 %
NEUTROS ABS: 1.6 10*3/uL — AB (ref 1.7–7.7)
NEUTROS PCT: 39 %
Platelets: 286 10*3/uL (ref 150–400)
RBC: 4.1 MIL/uL (ref 3.87–5.11)
RDW: 13 % (ref 11.5–15.5)
WBC: 4.1 10*3/uL (ref 4.0–10.5)

## 2016-05-23 LAB — URINALYSIS, ROUTINE W REFLEX MICROSCOPIC
Bilirubin Urine: NEGATIVE
Glucose, UA: NEGATIVE mg/dL
HGB URINE DIPSTICK: NEGATIVE
Ketones, ur: NEGATIVE mg/dL
NITRITE: NEGATIVE
PROTEIN: NEGATIVE mg/dL
Specific Gravity, Urine: 1.022 (ref 1.005–1.030)
pH: 6 (ref 5.0–8.0)

## 2016-05-23 LAB — TYPE AND SCREEN
ABO/RH(D): A POS
Antibody Screen: NEGATIVE

## 2016-05-23 LAB — SURGICAL PCR SCREEN
MRSA, PCR: NEGATIVE
Staphylococcus aureus: NEGATIVE

## 2016-05-23 LAB — APTT: aPTT: 29 seconds (ref 24–36)

## 2016-05-23 LAB — PROTIME-INR
INR: 1.04
Prothrombin Time: 13.6 seconds (ref 11.4–15.2)

## 2016-05-23 LAB — SEDIMENTATION RATE: SED RATE: 20 mm/h (ref 0–22)

## 2016-05-23 LAB — C-REACTIVE PROTEIN: CRP: 1 mg/dL — AB (ref <1.0)

## 2016-05-24 ENCOUNTER — Ambulatory Visit: Payer: No Typology Code available for payment source | Attending: Internal Medicine

## 2016-05-24 NOTE — Progress Notes (Addendum)
   [facility] Phone: [phone]   Date of Visit: 05/25/2016   HPI:  Hip Pain:  - scheduled for L total hip arthoplasty  - is taking Naxproxen. No issues with stomach pain or dark/tarry stools   HLD:  - not taking Lovastatin due to cost. Asked if there is a medication in the [employer] $4 list.   HTN: - was started on HCTZ by another physician - taking all medications as prescribed - denies chest pain, HA, visual changes, shortness of breath   New Diagnosis of DM:  - discussed new diagnosis with A1c of 6.5  - discussed lifestyle modifications with diet change and exercise.   Thyroid Nodule:  - History of thyroid nodule which was note on MRI c-spine around 12/2014. TSH checked at that time which was normal. [place] was ordered to evaluate, but order expired and patient never had this done.   Asks if we are able to remove irritating skin tag on her left cheek.   ROS: See HPI.  [place]:  PMH: HTN GERD, severe Hx of Gastric Ulcer  Thyroid Nodule OA of Hip  PHYSICAL EXAM: BP 130/80   Pulse 93   Temp 98.3 F (36.8 C) (Oral)   Ht 5\' 2"  (1.575 m)   Wt 254 lb (115.2 kg)   SpO2 96%   BMI 46.46 kg/m  GEN: NAD Neck: normal thyroid on palpation CV: RRR, no murmurs, rubs, or gallops PULM: CTAB, normal effort ABD: Soft, nontender, nondistended, NABS, no organomegaly SKIN: No rash or cyanosis; warm and well-perfused. Skin tag left cheek pedunculated.  EXTR: No lower extremity edema or calf tenderness PSYCH: Mood and affect euthymic, normal rate and volume of speech NEURO: Awake, alert, no focal deficits grossly, normal speech  Skin tag removal: Consent signed. Discussed low risks. Used mosquito forceps to crush base of skin tag and scissors to detach. Minimal oozing. Bandage placed.   ASSESSMENT/PLAN: Health Maintenance: - Hep C Screen today  HYPERTENSION, BENIGN SYSTEMIC Controlled with current medications of HCTZ and metoprolol. Continue current  regimen. Noted mild hypokalemia in recent BMP. Will recheck today.   Thyroid nodule Ordered thyroid US today for further evaluation   Diabetes ([place]) New diagnosis with A1c 6.5. Will attempt lifestyle changes first which were discussed today. Follow up in about 2 months for recheck.   Smiley Houseman, MD PGY [place]

## 2016-05-25 ENCOUNTER — Encounter: Payer: Self-pay | Admitting: Internal Medicine

## 2016-05-25 ENCOUNTER — Ambulatory Visit (INDEPENDENT_AMBULATORY_CARE_PROVIDER_SITE_OTHER): Payer: No Typology Code available for payment source | Admitting: Internal Medicine

## 2016-05-25 VITALS — BP 130/80 | HR 93 | Temp 98.3°F | Ht 62.0 in | Wt 254.0 lb

## 2016-05-25 DIAGNOSIS — I10 Essential (primary) hypertension: Secondary | ICD-10-CM

## 2016-05-25 DIAGNOSIS — E876 Hypokalemia: Secondary | ICD-10-CM

## 2016-05-25 DIAGNOSIS — E041 Nontoxic single thyroid nodule: Secondary | ICD-10-CM

## 2016-05-25 DIAGNOSIS — E119 Type 2 diabetes mellitus without complications: Secondary | ICD-10-CM

## 2016-05-25 DIAGNOSIS — Z1159 Encounter for screening for other viral diseases: Secondary | ICD-10-CM

## 2016-05-25 NOTE — Patient Instructions (Addendum)
I will check on your cholesterol medication and send the appropriate medication in I ordered your thyroid ultrasound We will get labs today.  Follow up in 2 months    Diet Recommendations for Diabetes   Starchy (carb) foods include: Bread, rice, pasta, potatoes, corn, crackers, bagels, muffins, all baked goods.  (Fruits, milk, and yogurt also have carbohydrate, but most of these foods will not spike your blood sugar as the starchy foods will.)  A few fruits do cause high blood sugars; use small portions of bananas (limit to 1/2 at a time), grapes, and most tropical fruits.    Protein foods include: Meat, fish, poultry, eggs, dairy foods, and beans such as pinto and kidney beans (beans also provide carbohydrate).   1. Eat at least 3 meals and 1-2 snacks per day. Never go more than 4-5 hours while awake without eating.  2. Limit starchy foods to TWO per meal and ONE per snack. ONE portion of a starchy  food is equal to the following:   - ONE slice of bread (or its equivalent, such as half of a hamburger bun).   - 1/2 cup of a "scoopable" starchy food such as potatoes or rice.   - 15 grams of carbohydrate as shown on food label.  3. Both lunch and dinner should include a protein food, a carb food, and vegetables.   - Obtain twice as many veg's as protein or carbohydrate foods for both lunch and dinner.   - Fresh or frozen veg's are best.   - Try to keep frozen veg's on hand for a quick vegetable serving.    4. Breakfast should always include protein.

## 2016-05-25 NOTE — Assessment & Plan Note (Addendum)
Controlled with current medications of HCTZ and metoprolol. Continue current regimen. Noted mild hypokalemia in recent BMP. Will recheck today.

## 2016-05-25 NOTE — Assessment & Plan Note (Signed)
" >>  ASSESSMENT AND PLAN FOR HYPERTENSION, BENIGN SYSTEMIC WRITTEN ON 05/25/2016 10:18 PM BY Madyx Delfin G, MD  Controlled with current medications of HCTZ and metoprolol . Continue current regimen. Noted mild hypokalemia in recent BMP. Will recheck today.  "

## 2016-05-25 NOTE — Assessment & Plan Note (Signed)
New diagnosis with A1c 6.5. Will attempt lifestyle changes first which were discussed today. Follow up in about 2 months for recheck.

## 2016-05-25 NOTE — Assessment & Plan Note (Signed)
Ordered thyroid US today for further evaluation

## 2016-05-26 LAB — BASIC METABOLIC PANEL WITH GFR
BUN: 14 mg/dL (ref 7–25)
CHLORIDE: 100 mmol/L (ref 98–110)
CO2: 30 mmol/L (ref 20–31)
CREATININE: 1.05 mg/dL (ref 0.50–1.05)
Calcium: 10 mg/dL (ref 8.6–10.4)
GFR, Est African American: 67 mL/min (ref >=60)
GFR, Est Non African American: 58 mL/min — ABNORMAL LOW (ref >=60)
Glucose, Bld: 110 mg/dL — ABNORMAL HIGH (ref 65–99)
POTASSIUM: 3.5 mmol/L (ref 3.5–5.3)
SODIUM: 140 mmol/L (ref 135–146)

## 2016-05-26 LAB — HEPATITIS C ANTIBODY: HCV Ab: NEGATIVE

## 2016-05-31 MED ORDER — TRANEXAMIC ACID 1000 MG/10ML IV SOLN
1000.0000 mg | INTRAVENOUS | Status: AC
  Administered 2016-06-01: 1000 mg via INTRAVENOUS
  Filled 2016-05-31: qty 10

## 2016-06-01 ENCOUNTER — Inpatient Hospital Stay (HOSPITAL_COMMUNITY): Payer: Self-pay

## 2016-06-01 ENCOUNTER — Other Ambulatory Visit: Payer: Self-pay | Admitting: Internal Medicine

## 2016-06-01 ENCOUNTER — Inpatient Hospital Stay (HOSPITAL_COMMUNITY)
Admission: RE | Admit: 2016-06-01 | Discharge: 2016-06-03 | DRG: 470 | Disposition: A | Discharge status: Home Health | Payer: Self-pay | Source: Ambulatory Visit | Attending: Orthopaedic Surgery | Admitting: Orthopaedic Surgery

## 2016-06-01 ENCOUNTER — Encounter (HOSPITAL_COMMUNITY): Payer: Self-pay | Admitting: *Deleted

## 2016-06-01 ENCOUNTER — Inpatient Hospital Stay (HOSPITAL_COMMUNITY): Payer: Self-pay | Admitting: Anesthesiology

## 2016-06-01 ENCOUNTER — Encounter (HOSPITAL_COMMUNITY)
Admission: RE | Disposition: A | Discharge status: Home Health | Payer: Self-pay | Source: Ambulatory Visit | Attending: Orthopaedic Surgery

## 2016-06-01 DIAGNOSIS — Z86010 Personal history of colonic polyps: Secondary | ICD-10-CM

## 2016-06-01 DIAGNOSIS — Z888 Allergy status to other drugs, medicaments and biological substances status: Secondary | ICD-10-CM

## 2016-06-01 DIAGNOSIS — R262 Difficulty in walking, not elsewhere classified: Secondary | ICD-10-CM

## 2016-06-01 DIAGNOSIS — Z87891 Personal history of nicotine dependence: Secondary | ICD-10-CM

## 2016-06-01 DIAGNOSIS — K219 Gastro-esophageal reflux disease without esophagitis: Secondary | ICD-10-CM | POA: Diagnosis present

## 2016-06-01 DIAGNOSIS — E669 Obesity, unspecified: Secondary | ICD-10-CM | POA: Diagnosis present

## 2016-06-01 DIAGNOSIS — E785 Hyperlipidemia, unspecified: Secondary | ICD-10-CM | POA: Diagnosis present

## 2016-06-01 DIAGNOSIS — Z419 Encounter for procedure for purposes other than remedying health state, unspecified: Secondary | ICD-10-CM

## 2016-06-01 DIAGNOSIS — Z96642 Presence of left artificial hip joint: Secondary | ICD-10-CM

## 2016-06-01 DIAGNOSIS — I10 Essential (primary) hypertension: Secondary | ICD-10-CM | POA: Diagnosis present

## 2016-06-01 DIAGNOSIS — Z96649 Presence of unspecified artificial hip joint: Secondary | ICD-10-CM

## 2016-06-01 DIAGNOSIS — Z6841 Body Mass Index (BMI) 40.0 and over, adult: Secondary | ICD-10-CM

## 2016-06-01 DIAGNOSIS — Z79899 Other long term (current) drug therapy: Secondary | ICD-10-CM

## 2016-06-01 DIAGNOSIS — M1612 Unilateral primary osteoarthritis, left hip: Principal | ICD-10-CM | POA: Diagnosis present

## 2016-06-01 DIAGNOSIS — D62 Acute posthemorrhagic anemia: Secondary | ICD-10-CM | POA: Diagnosis not present

## 2016-06-01 HISTORY — PX: TOTAL HIP ARTHROPLASTY: SHX124

## 2016-06-01 SURGERY — ARTHROPLASTY, HIP, TOTAL, ANTERIOR APPROACH
Anesthesia: Spinal | Laterality: Left

## 2016-06-01 MED ORDER — PHENOL 1.4 % MT LIQD: 1.0000 | OROMUCOSAL | Status: DC | PRN

## 2016-06-01 MED ORDER — CEFAZOLIN SODIUM-DEXTROSE 2-4 GM/100ML-% IV SOLN
2.0000 g | Freq: Four times a day (QID) | INTRAVENOUS | Status: AC
  Administered 2016-06-01 – 2016-06-02 (×2): 2 g via INTRAVENOUS
  Filled 2016-06-01 (×2): qty 100

## 2016-06-01 MED ORDER — OXYCODONE-ACETAMINOPHEN 5-325 MG PO TABS: 1.0000 | ORAL_TABLET | ORAL | 0 refills | Status: DC | PRN

## 2016-06-01 MED ORDER — SODIUM CHLORIDE 0.9 % IV SOLN: INTRAVENOUS | Status: DC

## 2016-06-01 MED ORDER — ACETAMINOPHEN 650 MG RE SUPP: 650.0000 mg | Freq: Four times a day (QID) | RECTAL | Status: DC | PRN

## 2016-06-01 MED ORDER — ONDANSETRON HCL 4 MG PO TABS: 4.0000 mg | ORAL_TABLET | Freq: Three times a day (TID) | ORAL | 0 refills | Status: DC | PRN

## 2016-06-01 MED ORDER — ACETAMINOPHEN 325 MG PO TABS: 650.0000 mg | ORAL_TABLET | Freq: Four times a day (QID) | ORAL | Status: DC | PRN

## 2016-06-01 MED ORDER — KETOROLAC TROMETHAMINE 30 MG/ML IJ SOLN
INTRAMUSCULAR | Status: AC
  Administered 2016-06-01: 30 mg via INTRAVENOUS
  Filled 2016-06-01: qty 1

## 2016-06-01 MED ORDER — SODIUM CHLORIDE 0.9 % IR SOLN
Status: DC | PRN
  Administered 2016-06-01: 3000 mL

## 2016-06-01 MED ORDER — HYDROCHLOROTHIAZIDE 25 MG PO TABS
25.0000 mg | ORAL_TABLET | Freq: Every day | ORAL | Status: DC
  Administered 2016-06-01 – 2016-06-03 (×3): 25 mg via ORAL
  Filled 2016-06-01 (×3): qty 1

## 2016-06-01 MED ORDER — LIDOCAINE 2% (20 MG/ML) 5 ML SYRINGE
INTRAMUSCULAR | Status: AC
  Filled 2016-06-01: qty 5

## 2016-06-01 MED ORDER — ONDANSETRON HCL 4 MG PO TABS: 4.0000 mg | ORAL_TABLET | Freq: Four times a day (QID) | ORAL | Status: DC | PRN

## 2016-06-01 MED ORDER — OXYCODONE HCL ER 10 MG PO T12A
10.0000 mg | EXTENDED_RELEASE_TABLET | Freq: Two times a day (BID) | ORAL | Status: DC
  Administered 2016-06-01 – 2016-06-03 (×4): 10 mg via ORAL
  Filled 2016-06-01 (×4): qty 1

## 2016-06-01 MED ORDER — ONDANSETRON HCL 4 MG/2ML IJ SOLN: 4.0000 mg | Freq: Four times a day (QID) | INTRAMUSCULAR | Status: DC | PRN

## 2016-06-01 MED ORDER — KETOROLAC TROMETHAMINE 30 MG/ML IJ SOLN
30.0000 mg | Freq: Four times a day (QID) | INTRAMUSCULAR | Status: DC | PRN
  Administered 2016-06-01: 30 mg via INTRAVENOUS

## 2016-06-01 MED ORDER — CHLORHEXIDINE GLUCONATE 4 % EX LIQD: 60.0000 mL | Freq: Once | CUTANEOUS | Status: DC

## 2016-06-01 MED ORDER — TRANEXAMIC ACID 1000 MG/10ML IV SOLN
2000.0000 mg | INTRAVENOUS | Status: DC
  Filled 2016-06-01: qty 20

## 2016-06-01 MED ORDER — METOCLOPRAMIDE HCL 5 MG/ML IJ SOLN
5.0000 mg | Freq: Three times a day (TID) | INTRAMUSCULAR | Status: DC | PRN
Start: 2016-06-01 — End: 2016-06-03
  Administered 2016-06-01: 10 mg via INTRAVENOUS
  Filled 2016-06-01: qty 2

## 2016-06-01 MED ORDER — DIPHENHYDRAMINE HCL 12.5 MG/5ML PO ELIX: 25.0000 mg | ORAL_SOLUTION | ORAL | Status: DC | PRN

## 2016-06-01 MED ORDER — PROMETHAZINE HCL 25 MG PO TABS: 25.0000 mg | ORAL_TABLET | Freq: Four times a day (QID) | ORAL | 1 refills | Status: DC | PRN

## 2016-06-01 MED ORDER — MENTHOL 3 MG MT LOZG: 1.0000 | LOZENGE | OROMUCOSAL | Status: DC | PRN

## 2016-06-01 MED ORDER — SODIUM CHLORIDE 0.9 % IV SOLN: 500.0000 mL | INTRAVENOUS | Status: DC

## 2016-06-01 MED ORDER — ACETAMINOPHEN 500 MG PO TABS
1000.0000 mg | ORAL_TABLET | Freq: Four times a day (QID) | ORAL | Status: AC
  Administered 2016-06-01 – 2016-06-02 (×3): 1000 mg via ORAL
  Filled 2016-06-01 (×3): qty 2

## 2016-06-01 MED ORDER — 0.9 % SODIUM CHLORIDE (POUR BTL) OPTIME
TOPICAL | Status: DC | PRN
  Administered 2016-06-01: 1000 mL

## 2016-06-01 MED ORDER — DEXTROSE 5 % IV SOLN
500.0000 mg | Freq: Four times a day (QID) | INTRAVENOUS | Status: DC | PRN
  Filled 2016-06-01: qty 5

## 2016-06-01 MED ORDER — POVIDONE-IODINE 10 % EX SOLN
CUTANEOUS | Status: DC | PRN
  Administered 2016-06-01: 1 via TOPICAL

## 2016-06-01 MED ORDER — BUPIVACAINE HCL (PF) 0.5 % IJ SOLN
INTRAMUSCULAR | Status: DC | PRN
  Administered 2016-06-01: 3 mL via INTRATHECAL

## 2016-06-01 MED ORDER — BUPIVACAINE LIPOSOME 1.3 % IJ SUSP
20.0000 mL | INTRAMUSCULAR | Status: DC
  Filled 2016-06-01: qty 20

## 2016-06-01 MED ORDER — MIDAZOLAM HCL 2 MG/2ML IJ SOLN
INTRAMUSCULAR | Status: AC
  Filled 2016-06-01: qty 2

## 2016-06-01 MED ORDER — ASPIRIN EC 325 MG PO TBEC
325.0000 mg | DELAYED_RELEASE_TABLET | Freq: Two times a day (BID) | ORAL | Status: DC
  Administered 2016-06-01 – 2016-06-03 (×3): 325 mg via ORAL
  Filled 2016-06-01 (×3): qty 1

## 2016-06-01 MED ORDER — SORBITOL 70 % SOLN: 30.0000 mL | Freq: Every day | Status: DC | PRN

## 2016-06-01 MED ORDER — OXYCODONE HCL 5 MG PO TABS: 5.0000 mg | ORAL_TABLET | Freq: Once | ORAL | Status: DC | PRN

## 2016-06-01 MED ORDER — OXYCODONE HCL 5 MG PO TABS
ORAL_TABLET | ORAL | Status: AC
  Administered 2016-06-01: 10 mg via ORAL
  Filled 2016-06-01: qty 2

## 2016-06-01 MED ORDER — PROPOFOL 10 MG/ML IV BOLUS
INTRAVENOUS | Status: AC
  Filled 2016-06-01: qty 20

## 2016-06-01 MED ORDER — METHOCARBAMOL 500 MG PO TABS
ORAL_TABLET | ORAL | Status: AC
  Administered 2016-06-01: 500 mg via ORAL
  Filled 2016-06-01: qty 1

## 2016-06-01 MED ORDER — DEXAMETHASONE SODIUM PHOSPHATE 10 MG/ML IJ SOLN
10.0000 mg | Freq: Once | INTRAMUSCULAR | Status: AC
  Administered 2016-06-02: 10 mg via INTRAVENOUS
  Filled 2016-06-01: qty 1

## 2016-06-01 MED ORDER — METHOCARBAMOL 750 MG PO TABS: 750.0000 mg | ORAL_TABLET | Freq: Two times a day (BID) | ORAL | 0 refills | Status: DC | PRN

## 2016-06-01 MED ORDER — LIDOCAINE HCL (CARDIAC) 20 MG/ML IV SOLN
INTRAVENOUS | Status: DC | PRN
  Administered 2016-06-01: 50 mg via INTRAVENOUS

## 2016-06-01 MED ORDER — LACTATED RINGERS IV SOLN
INTRAVENOUS | Status: DC
  Administered 2016-06-01: 11:00:00 via INTRAVENOUS

## 2016-06-01 MED ORDER — CYCLOBENZAPRINE HCL 10 MG PO TABS
10.0000 mg | ORAL_TABLET | Freq: Three times a day (TID) | ORAL | Status: DC | PRN
  Administered 2016-06-02 (×2): 10 mg via ORAL
  Filled 2016-06-01 (×2): qty 1

## 2016-06-01 MED ORDER — PANTOPRAZOLE SODIUM 40 MG PO TBEC
40.0000 mg | DELAYED_RELEASE_TABLET | Freq: Every day | ORAL | Status: DC
  Administered 2016-06-02 – 2016-06-03 (×2): 40 mg via ORAL
  Filled 2016-06-01 (×2): qty 1

## 2016-06-01 MED ORDER — FENTANYL CITRATE (PF) 100 MCG/2ML IJ SOLN
INTRAMUSCULAR | Status: DC | PRN
  Administered 2016-06-01 (×2): 50 ug via INTRAVENOUS

## 2016-06-01 MED ORDER — METOCLOPRAMIDE HCL 5 MG PO TABS: 5.0000 mg | ORAL_TABLET | Freq: Three times a day (TID) | ORAL | Status: DC | PRN

## 2016-06-01 MED ORDER — MAGNESIUM CITRATE PO SOLN: 1.0000 | Freq: Once | ORAL | Status: DC | PRN

## 2016-06-01 MED ORDER — TRANEXAMIC ACID 1000 MG/10ML IV SOLN
INTRAVENOUS | Status: DC | PRN
  Administered 2016-06-01: 2000 mg via TOPICAL

## 2016-06-01 MED ORDER — METOPROLOL TARTRATE 25 MG PO TABS
25.0000 mg | ORAL_TABLET | Freq: Two times a day (BID) | ORAL | Status: DC
  Administered 2016-06-02 – 2016-06-03 (×3): 25 mg via ORAL
  Filled 2016-06-01 (×4): qty 1

## 2016-06-01 MED ORDER — PHENYLEPHRINE HCL 10 MG/ML IJ SOLN
INTRAVENOUS | Status: DC | PRN
  Administered 2016-06-01: 40 ug/min via INTRAVENOUS

## 2016-06-01 MED ORDER — METHOCARBAMOL 500 MG PO TABS
500.0000 mg | ORAL_TABLET | Freq: Four times a day (QID) | ORAL | Status: DC | PRN
  Administered 2016-06-01 – 2016-06-03 (×3): 500 mg via ORAL
  Filled 2016-06-01 (×2): qty 1

## 2016-06-01 MED ORDER — ONDANSETRON HCL 4 MG/2ML IJ SOLN
INTRAMUSCULAR | Status: AC
  Filled 2016-06-01: qty 2

## 2016-06-01 MED ORDER — PROPOFOL 500 MG/50ML IV EMUL
INTRAVENOUS | Status: DC | PRN
  Administered 2016-06-01: 50 ug/kg/min via INTRAVENOUS

## 2016-06-01 MED ORDER — OXYCODONE HCL ER 10 MG PO T12A: 10.0000 mg | EXTENDED_RELEASE_TABLET | Freq: Two times a day (BID) | ORAL | 0 refills | Status: DC

## 2016-06-01 MED ORDER — FENTANYL CITRATE (PF) 100 MCG/2ML IJ SOLN
INTRAMUSCULAR | Status: AC
  Filled 2016-06-01: qty 2

## 2016-06-01 MED ORDER — ONDANSETRON HCL 4 MG/2ML IJ SOLN
INTRAMUSCULAR | Status: DC | PRN
  Administered 2016-06-01: 4 mg via INTRAVENOUS

## 2016-06-01 MED ORDER — POLYETHYLENE GLYCOL 3350 17 G PO PACK
17.0000 g | PACK | Freq: Every day | ORAL | Status: DC | PRN
  Administered 2016-06-03: 17 g via ORAL
  Filled 2016-06-01: qty 1

## 2016-06-01 MED ORDER — CEFAZOLIN SODIUM-DEXTROSE 2-4 GM/100ML-% IV SOLN
2.0000 g | INTRAVENOUS | Status: AC
  Administered 2016-06-01: 2 g via INTRAVENOUS
  Filled 2016-06-01: qty 100

## 2016-06-01 MED ORDER — MORPHINE SULFATE (PF) 2 MG/ML IV SOLN: 1.0000 mg | INTRAVENOUS | Status: DC | PRN

## 2016-06-01 MED ORDER — HYDROMORPHONE HCL 1 MG/ML IJ SOLN
0.2500 mg | INTRAMUSCULAR | Status: DC | PRN
  Administered 2016-06-01 (×4): 0.5 mg via INTRAVENOUS

## 2016-06-01 MED ORDER — ASPIRIN EC 325 MG PO TBEC: 325.0000 mg | DELAYED_RELEASE_TABLET | Freq: Two times a day (BID) | ORAL | 0 refills | Status: DC

## 2016-06-01 MED ORDER — AMITRIPTYLINE HCL 75 MG PO TABS
75.0000 mg | ORAL_TABLET | Freq: Two times a day (BID) | ORAL | Status: DC
  Administered 2016-06-01 – 2016-06-03 (×4): 75 mg via ORAL
  Filled 2016-06-01 (×4): qty 1
  Filled 2016-06-01 (×4): qty 3

## 2016-06-01 MED ORDER — OXYCODONE HCL 5 MG/5ML PO SOLN: 5.0000 mg | Freq: Once | ORAL | Status: DC | PRN

## 2016-06-01 MED ORDER — OXYCODONE HCL 5 MG PO TABS
5.0000 mg | ORAL_TABLET | ORAL | Status: DC | PRN
  Administered 2016-06-01 – 2016-06-03 (×5): 10 mg via ORAL
  Filled 2016-06-01 (×4): qty 2

## 2016-06-01 MED ORDER — MIDAZOLAM HCL 5 MG/5ML IJ SOLN
INTRAMUSCULAR | Status: DC | PRN
  Administered 2016-06-01 (×2): 1 mg via INTRAVENOUS

## 2016-06-01 MED ORDER — HYDROMORPHONE HCL 2 MG/ML IJ SOLN
INTRAMUSCULAR | Status: AC
  Filled 2016-06-01: qty 1

## 2016-06-01 MED ORDER — PHENYLEPHRINE HCL 10 MG/ML IJ SOLN
INTRAMUSCULAR | Status: DC | PRN
  Administered 2016-06-01 (×4): 80 ug via INTRAVENOUS

## 2016-06-01 MED ORDER — ALUM & MAG HYDROXIDE-SIMETH 200-200-20 MG/5ML PO SUSP: 30.0000 mL | ORAL | Status: DC | PRN

## 2016-06-01 SURGICAL SUPPLY — 56 items
ADH SKN CLS LQ APL DERMABOND (GAUZE/BANDAGES/DRESSINGS) ×1
BAG DECANTER FOR FLEXI CONT (MISCELLANEOUS) ×3 IMPLANT
CAPT HIP TOTAL 2 ×2 IMPLANT
CELLS DAT CNTRL 66122 CELL SVR (MISCELLANEOUS) ×1 IMPLANT
COVER SURGICAL LIGHT HANDLE (MISCELLANEOUS) ×3 IMPLANT
DERMABOND ADHESIVE PROPEN (GAUZE/BANDAGES/DRESSINGS) ×2
DERMABOND ADVANCED .7 DNX6 (GAUZE/BANDAGES/DRESSINGS) IMPLANT
DRAPE C-ARM 42X72 X-RAY (DRAPES) ×3 IMPLANT
DRAPE STERI IOBAN 125X83 (DRAPES) ×3 IMPLANT
DRAPE U-SHAPE 47X51 STRL (DRAPES) ×9 IMPLANT
DRESSING AQUACEL AG SP 3.5X10 (GAUZE/BANDAGES/DRESSINGS) IMPLANT
DRSG AQUACEL AG ADV 3.5X10 (GAUZE/BANDAGES/DRESSINGS) ×3 IMPLANT
DRSG AQUACEL AG SP 3.5X10 (GAUZE/BANDAGES/DRESSINGS) ×3
DURAPREP 26ML APPLICATOR (WOUND CARE) ×3 IMPLANT
ELECT BLADE 4.0 EZ CLEAN MEGAD (MISCELLANEOUS) ×3
ELECT REM PT RETURN 9FT ADLT (ELECTROSURGICAL) ×3
ELECTRODE BLDE 4.0 EZ CLN MEGD (MISCELLANEOUS) ×1 IMPLANT
ELECTRODE REM PT RTRN 9FT ADLT (ELECTROSURGICAL) ×1 IMPLANT
GLOVE SKINSENSE NS SZ7.5 (GLOVE) ×2
GLOVE SKINSENSE STRL SZ7.5 (GLOVE) ×1 IMPLANT
GLOVE SURG SYN 7.5  E (GLOVE) ×4
GLOVE SURG SYN 7.5 E (GLOVE) ×2 IMPLANT
GLOVE SURG SYN 7.5 PF PI (GLOVE) ×2 IMPLANT
GOWN SRG XL XLNG 56XLVL 4 (GOWN DISPOSABLE) ×1 IMPLANT
GOWN STRL NON-REIN XL XLG LVL4 (GOWN DISPOSABLE) ×6
GOWN STRL REUS W/ TWL LRG LVL3 (GOWN DISPOSABLE) IMPLANT
GOWN STRL REUS W/TWL LRG LVL3 (GOWN DISPOSABLE) ×12
HANDPIECE INTERPULSE COAX TIP (DISPOSABLE) ×3
HOOD PEEL AWAY FLYTE STAYCOOL (MISCELLANEOUS) ×8 IMPLANT
IV NS 1000ML (IV SOLUTION) ×3
IV NS 1000ML BAXH (IV SOLUTION) ×1 IMPLANT
IV NS IRRIG 3000ML ARTHROMATIC (IV SOLUTION) ×3 IMPLANT
KIT BASIN OR (CUSTOM PROCEDURE TRAY) ×3 IMPLANT
MARKER SKIN DUAL TIP RULER LAB (MISCELLANEOUS) ×3 IMPLANT
NDL SPNL 18GX3.5 QUINCKE PK (NEEDLE) ×1 IMPLANT
NEEDLE SPNL 18GX3.5 QUINCKE PK (NEEDLE) ×3 IMPLANT
PACK TOTAL JOINT (CUSTOM PROCEDURE TRAY) ×3 IMPLANT
PACK UNIVERSAL I (CUSTOM PROCEDURE TRAY) ×3 IMPLANT
RETRACTOR WND ALEXIS 18 MED (MISCELLANEOUS) ×1 IMPLANT
RTRCTR WOUND ALEXIS 18CM MED (MISCELLANEOUS) ×3
SAW OSC TIP CART 19.5X105X1.3 (SAW) ×3 IMPLANT
SEALER BIPOLAR AQUA 6.0 (INSTRUMENTS) ×3 IMPLANT
SET HNDPC FAN SPRY TIP SCT (DISPOSABLE) ×1 IMPLANT
STAPLER VISISTAT 35W (STAPLE) IMPLANT
SUT ETHIBOND 2 V 37 (SUTURE) ×3 IMPLANT
SUT ETHIBOND NAB CT1 #1 30IN (SUTURE) ×9 IMPLANT
SUT ETHILON 2 0 FS 18 (SUTURE) ×4 IMPLANT
SUT VIC AB 1 CT1 27 (SUTURE) ×3
SUT VIC AB 1 CT1 27XBRD ANBCTR (SUTURE) ×1 IMPLANT
SUT VIC AB 2-0 CT1 27 (SUTURE) ×3
SUT VIC AB 2-0 CT1 TAPERPNT 27 (SUTURE) ×1 IMPLANT
SYR 20CC LL (SYRINGE) ×3 IMPLANT
SYR 50ML LL SCALE MARK (SYRINGE) ×3 IMPLANT
TOWEL OR 17X26 10 PK STRL BLUE (TOWEL DISPOSABLE) ×3 IMPLANT
TRAY CATH 16FR W/PLASTIC CATH (SET/KITS/TRAYS/PACK) ×3 IMPLANT
YANKAUER SUCT BULB TIP NO VENT (SUCTIONS) ×3 IMPLANT

## 2016-06-01 NOTE — Transfer of Care (Signed)
Immediate Anesthesia Transfer of Care Note  Patient: Kristin [name]  Procedure(s) Performed: Procedure(s): LEFT TOTAL HIP ARTHROPLASTY ANTERIOR APPROACH (Left)  Patient Location: PACU  Anesthesia Type:Spinal  Level of Consciousness: awake  Airway & Oxygen Therapy: Patient Spontanous Breathing and Patient connected to nasal cannula oxygen  Post-op Assessment: Report given to RN and Post -op Vital signs reviewed and stable  Post vital signs: Reviewed and stable  Last Vitals:  Vitals:   06/01/16 1021  BP: (!) 141/89  Pulse: 90  Temp: 37.2 C    Last Pain:  Vitals:   06/01/16 1021  TempSrc: Oral         Complications: No apparent anesthesia complications

## 2016-06-01 NOTE — Anesthesia Procedure Notes (Signed)
Spinal Patient location during procedure: OR Staffing Anesthesiologist: Hadja Harral Preanesthetic Checklist Completed: patient identified, surgical consent, pre-op evaluation, timeout performed, IV checked, risks and benefits discussed and monitors and equipment checked Spinal Block Patient position: sitting Prep: site prepped and draped and DuraPrep Patient monitoring: heart rate, cardiac monitor, continuous pulse ox and blood pressure Approach: midline Location: L3-4 Injection technique: single-shot Needle Needle type: Pencan  Needle gauge: 24 G Needle length: 10 cm Assessment Sensory level: T6   

## 2016-06-01 NOTE — Anesthesia Postprocedure Evaluation (Signed)
Anesthesia Post Note  Patient: [name]  Procedure(s) Performed: Procedure(s) (LRB): LEFT TOTAL HIP ARTHROPLASTY ANTERIOR APPROACH (Left)  Patient location during evaluation: PACU Anesthesia Type: Spinal Level of consciousness: awake and alert and oriented Pain management: pain level controlled Vital Signs Assessment: post-procedure vital signs reviewed and stable Respiratory status: spontaneous breathing, nonlabored ventilation and respiratory function stable Cardiovascular status: stable and blood pressure returned to baseline Postop Assessment: spinal receding, no backache and no headache Anesthetic complications: no    Last Vitals:  Vitals:   06/01/16 1021 06/01/16 1610  BP: (!) 141/89 119/71  Pulse: 90 87  Resp:  17  Temp: 37.2 C 36.4 C    Last Pain:  Vitals:   06/01/16 1610  TempSrc:   PainSc: 0-No pain                 Henley Blyth A.

## 2016-06-01 NOTE — H&P (Signed)
PREOPERATIVE H&P  Chief Complaint: left hip degenerative joint disease  HPI: Kristin [name] is a 60 y.o. female who presents for surgical treatment of left hip degenerative joint disease.  She denies any changes in medical history.  Past Medical History:  Diagnosis Date  . Allergy   . Anemia   . Anxiety   . Arthritis   . Back pain, chronic   . Bronchitis   . Cough   . Depression   . GERD (gastroesophageal reflux disease)   . Hyperlipidemia   . Hypertension   . Obesity   . 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 Seizures ([place])    last >10 yrs ago  . Somatization disorder   . Tobacco abuse    Past Surgical History:  Procedure Laterality Date  . ABDOMINAL HYSTERECTOMY    . CHOLECYSTECTOMY    . kidney stent Right 2002  . TONSILLECTOMY Bilateral 12/06/2014   Procedure: TONSILLECTOMY, BILATERAL;  Surgeon: Ruby Cola, MD;  Location: [facility];  Service: ENT;  Laterality: Bilateral;  . TONSILLECTOMY    . TUBAL LIGATION     Social History   Social History  . Marital status: Married    Spouse name: N/A  . Number of children: N/A  . Years of education: N/A   Social History Main Topics  . Smoking status: Former Smoker    Packs/day: 0.25    Years: 0.50    Types: Cigarettes    Quit date: 01/27/2014  . Smokeless tobacco: Never Used  . Alcohol use No  . Drug use: No  . Sexual activity: Not on file   Other Topics Concern  . Not on file   Social History Narrative  . No narrative on file   Family History  Problem Relation Age of Onset  . Other Mother     failed surgery  . Heart Problems Father     poss MI, not sure:per pt  . Alzheimer's disease Maternal Grandmother   . Heart disease Maternal Grandfather   . Colon cancer Maternal Uncle     not sure age of onset   Allergies  Allergen Reactions  . Fluconazole Swelling    REACTION: Face swelling  . Robitussin (Alcohol Free) [Guaifenesin] Palpitations    Chest pain   Prior to Admission  medications   Medication Sig Start Date End Date Taking? Authorizing Provider  amitriptyline (ELAVIL) 75 MG tablet Take 75 mg by mouth 2 (two) times daily.   Yes Historical Provider, MD  cyclobenzaprine (FLEXERIL) 10 MG tablet Take 1 tablet (10 mg total) by mouth 3 (three) times daily as needed for muscle spasms. 01/07/16  Yes Smiley Houseman, MD  hydrochlorothiazide (HYDRODIURIL) 25 MG tablet Take 1 tablet (25 mg total) by mouth daily. 04/07/16  Yes Elizabeth Woodland Mumaw, DO  omeprazole (PRILOSEC) 20 MG capsule Take 1 capsule (20 mg total) by mouth daily. Patient taking differently: Take 20 mg by mouth every evening.  01/07/16  Yes Smiley Houseman, MD  lovastatin (MEVACOR) 40 MG tablet Take 1 tablet (40 mg total) by mouth at bedtime. Patient not taking: Reported on 05/25/2016 01/28/16   Smiley Houseman, MD  metoprolol tartrate (LOPRESSOR) 25 MG tablet TAKE ONE TABLET BY MOUTH TWICE DAILY 03/30/16   Smiley Houseman, MD  naproxen (NAPROSYN) 500 MG tablet TAKE ONE TABLET BY MOUTH TWICE DAILY BETWEEN  MEALS  AS  NEEDED  FOR  MILD  PAIN,  MODERATE  PAIN OR  HEADACHE. Take with food 05/20/16   Smiley Houseman, MD     Positive ROS: All other systems have been reviewed and were otherwise negative with the exception of those mentioned in the HPI and as above.  Physical Exam: General: Alert, no acute distress Cardiovascular: No pedal edema Respiratory: No cyanosis, no use of accessory musculature GI: abdomen soft Skin: No lesions in the area of chief complaint Neurologic: Sensation intact distally Psychiatric: Patient is competent for consent with normal mood and affect Lymphatic: no lymphedema  MUSCULOSKELETAL: exam stable  Assessment: left hip degenerative joint disease  Plan: Plan for Procedure(s): LEFT TOTAL HIP ARTHROPLASTY ANTERIOR APPROACH  The risks benefits and alternatives were discussed with the patient including but not limited to the risks of nonoperative  treatment, versus surgical intervention including infection, bleeding, nerve injury,  blood clots, cardiopulmonary complications, morbidity, mortality, among others, and they were willing to proceed.   Eduard Roux, MD   06/01/2016 9:54 AM

## 2016-06-01 NOTE — Discharge Instructions (Signed)

## 2016-06-01 NOTE — Anesthesia Preprocedure Evaluation (Addendum)
Anesthesia Evaluation  Patient identified by MRN, date of birth, ID band Patient awake    Reviewed: Allergy & Precautions, NPO status , Patient's Chart, lab work & pertinent test results  History of Anesthesia Complications Negative for: history of anesthetic complications  Airway Mallampati: II  TM Distance: >3 FB Neck ROM: Full    Dental  (+) Poor Dentition, Missing   Pulmonary former smoker,    breath sounds clear to auscultation       Cardiovascular hypertension, Pt. on medications and Pt. on home beta blockers (-) CHF  Rhythm:Regular     Neuro/Psych  Headaches, Seizures -,  PSYCHIATRIC DISORDERS Anxiety Depression    GI/Hepatic PUD, GERD  Medicated,  Endo/Other  diabetes  Renal/GU Renal InsufficiencyRenal disease     Musculoskeletal  (+) Arthritis ,   Abdominal   Peds  Hematology  (+) anemia ,   Anesthesia Other Findings Broken and decayed right incisor.  Missing upper front right tooth.  Painful decayed tooth upper right near back.  Reproductive/Obstetrics                            Anesthesia Physical Anesthesia Plan  ASA: III  Anesthesia Plan: Spinal   Post-op Pain Management:    Induction:   Airway Management Planned: Natural Airway, Nasal Cannula and Simple Face Mask  Additional Equipment: None  Intra-op Plan:   Post-operative Plan:   Informed Consent: I have reviewed the patients History and Physical, chart, labs and discussed the procedure including the risks, benefits and alternatives for the proposed anesthesia with the patient or authorized representative who has indicated his/her understanding and acceptance.   Dental advisory given  Plan Discussed with: CRNA and Surgeon  Anesthesia Plan Comments:         Anesthesia Quick Evaluation

## 2016-06-01 NOTE — Op Note (Signed)
LEFT TOTAL HIP ARTHROPLASTY ANTERIOR APPROACH  Procedure Note Kristin [name]   PT:[id number]  Pre-op Diagnosis: left hip degenerative joint disease     Post-op Diagnosis: same   Operative Procedures  1. Total hip replacement; Left hip; uncemented [id number]   Personnel  Surgeon(s): Leandrew Koyanagi, MD   Anesthesia: spinal  Prosthesis: Depuy Acetabulum: Pinnacle 52 mm Femur: Corail KLA 11 Head: 36 size: 1.5 Liner: +4 Bearing Type: Ceramic on poly  Date of Service: 06/01/2016  Total Hip Arthroplasty (Anterior Approach) Op Note:  After informed consent was obtained and the operative extremity marked in the holding area, the patient was brought back to the operating room and placed supine on the HANA table. Next, the operative extremity was prepped and draped in normal sterile fashion. Surgical timeout occurred verifying patient identification, surgical site, surgical procedure and administration of antibiotics.  A modified anterior Smith-Peterson approach to the hip was performed, using the interval between tensor fascia lata and sartorius.  Dissection was carried bluntly down onto the anterior hip capsule. The lateral femoral circumflex vessels were identified and coagulated. A capsulotomy was performed and the capsular flaps tagged for later repair.  Fluoroscopy was utilized to prepare for the femoral neck cut. The neck osteotomy was performed. The femoral head was removed, the acetabular rim was cleared of soft tissue and attention was turned to reaming the acetabulum.  Sequential reaming was performed under fluoroscopic guidance. We reamed to a size 51 mm, and then impacted the acetabular shell. The liner was then placed after irrigation and attention turned to the femur.  After placing the femoral hook, the leg was taken to externally rotated, extended and adducted position taking care to perform soft tissue releases to allow for adequate mobilization of the femur. Soft tissue was  cleared from the shoulder of the greater trochanter and the hook elevator used to improve exposure of the proximal femur. Sequential broaching performed up to a size 11. Trial neck and head were placed. The leg was brought back up to neutral and the construct reduced. The position and sizing of components, offset and leg lengths were checked using fluoroscopy. Stability of the  construct was checked in extension and external rotation without any subluxation or impingement of prosthesis. We dislocated the prosthesis, dropped the leg back into position, removed trial components, and irrigated copiously. The final stem and head was then placed, the leg brought back up, the system reduced and fluoroscopy used to verify positioning.  We irrigated, obtained hemostasis and closed the capsule using #2 ethibond suture.  Dilute betadyne solution was used. The fascia was closed with #1 vicryl plus, the deep fat layer was closed with 0 vicryl, the subcutaneous layers closed with 2.0 Vicryl Plus and the skin closed with 2.0 nylon. A sterile dressing was applied. The patient was awakened in the operating room and taken to recovery in stable condition.  All sponge, needle, and instrument counts were correct at the end of the case.   Position: supine  Complications: none.  Time Out: performed   Drains/Packing: none  Estimated blood loss: 200 cc  Returned to Recovery Room: in good condition.   Antibiotics: yes   Mechanical VTE (DVT) Prophylaxis: sequential compression devices, TED thigh-high  Chemical VTE (DVT) Prophylaxis: aspirin   Fluid Replacement: see anesthesia record  Specimens Removed: 1 to pathology   Sponge and Instrument Count Correct? yes   PACU: portable radiograph - low AP   Admission: inpatient status  Plan/RTC: Return in 2  weeks for staple removal. Weight Bearing/Load Lower Extremity: full  Hip precautions: none Suture Removal: 10-14 days  Betadine to incision twice daily once  dressing is removed on POD#7  N. Eduard Roux, MD [place] 3:49 PM      Implant Name Type Inv. Item Serial No. Manufacturer Lot No. LRB No. Used  PIN SECTOR W/GRIP ACE CUP 52MM - [id number] Hips PIN SECTOR W/GRIP ACE CUP 52MM  DEPUY [id number] Left 1  LINER NEUTRAL 52X36MM PLUS 4 - [id number] Liner LINER NEUTRAL 52X36MM PLUS 4  DEPUY [id number] Left 1  STEM CORAIL KLA11 - [id number] Stem STEM CORAIL KLA11  DEPUY [id number] Left 1  HEAD CERAMIC DELTA 36 PLUS 1.5 - [id number] Hips HEAD CERAMIC DELTA 36 PLUS 1.5   DEPUY [id number] Left 1

## 2016-06-02 ENCOUNTER — Encounter (HOSPITAL_COMMUNITY): Payer: Self-pay | Admitting: Orthopaedic Surgery

## 2016-06-02 LAB — BASIC METABOLIC PANEL WITH GFR
Anion gap: 8 (ref 5–15)
BUN: 10 mg/dL (ref 6–20)
CO2: 30 mmol/L (ref 22–32)
Calcium: 8.7 mg/dL — ABNORMAL LOW (ref 8.9–10.3)
Chloride: 97 mmol/L — ABNORMAL LOW (ref 101–111)
Creatinine, Ser: 0.97 mg/dL (ref 0.44–1.00)
GFR calc Af Amer: >60 mL/min (ref >60)
GFR calc non Af Amer: >60 mL/min (ref >60)
Glucose, Bld: 129 mg/dL — ABNORMAL HIGH (ref 65–99)
Potassium: 3.1 mmol/L — ABNORMAL LOW (ref 3.5–5.1)
Sodium: 135 mmol/L (ref 135–145)

## 2016-06-02 LAB — CBC
HCT: 31.6 % — ABNORMAL LOW (ref 36.0–46.0)
Hemoglobin: 10.2 g/dL — ABNORMAL LOW (ref 12.0–15.0)
MCH: 29.3 pg (ref 26.0–34.0)
MCHC: 32.3 g/dL (ref 30.0–36.0)
MCV: 90.8 fL (ref 78.0–100.0)
Platelets: 226 K/uL (ref 150–400)
RBC: 3.48 MIL/uL — ABNORMAL LOW (ref 3.87–5.11)
RDW: 12.8 % (ref 11.5–15.5)
WBC: 7.2 K/uL (ref 4.0–10.5)

## 2016-06-02 NOTE — Care Management Note (Signed)
Case Management Note  Patient Details  Name: Kristin [name] MRN: [id number] Date of Birth: Jan 09, 1956  Subjective/Objective:  60 yr old female s/p left hip arthroplasty, anterior approach.                  Action/Plan: case manager spoke with patient concerning Home health needs and DME. Referral was called to Stevie Kern, [facility] Liaison. Patient states she has rolling walker and 3in1 at home, will have family support at discharge.    Expected Discharge Date:   06/03/16               Expected Discharge Plan:  [place]  In-House Referral:  NA  Discharge planning Services  CM Consult  Post Acute Care Choice:  Home Health Choice offered to:  Patient  DME Arranged:  N/A DME Agency:  NA  HH Arranged:  PT Success Agency:  [place]  Status of Service:  Completed, signed off  If discussed at [place] of Stay Meetings, dates discussed:    Additional Comments:  Ninfa Meeker, RN 06/02/2016, 4:03 PM

## 2016-06-02 NOTE — Evaluation (Signed)
Occupational Therapy Evaluation Patient Details Name: Kristin [name] MRN: [id number] DOB: 09-Jul-1955 Today's Date: 06/02/2016    History of Present Illness 60 yo female admitted on 06/01/16 for left THA direct anterior approach. PMH significant for HTN, Anxiety and Depression   Clinical Impression   PTA, pt was independent with Adl and IADL and assists with providing care for her aunt. Pt currently requires mod assist for LB ADL and min assist for functional mobility associated with toilet transfer. Recommend D/C home with 24 hour assistance from family and no OT follow-up recommended post-acute D/C. Recommend 3-in-1 [facility] for DME needs. OT will continue to follow acutely with focus on AE education and tub transfers. Pt would benefit from continued OT services to improve independence with ADL and functional mobility to ensure safe D/C home.    Follow Up Recommendations  No OT follow up;Supervision/Assistance - 24 hour    Equipment Recommendations  3 in 1 bedside comode    Recommendations for Other Services       Precautions / Restrictions Precautions Precautions: Fall Precaution Comments: direct anterior Restrictions Weight Bearing Restrictions: Yes LLE Weight Bearing: Weight bearing as tolerated      Mobility Bed Mobility Overal bed mobility: Needs Assistance Bed Mobility: Supine to Sit;Sit to Supine     Supine to sit: Min assist;HOB elevated Sit to supine: Mod assist;HOB elevated   General bed mobility comments: Min assist to move LLE OOB; required mod assist for supine to sit to raise B LE into bed.  Transfers Overall transfer level: Needs assistance Equipment used: Rolling walker (2 wheeled) Transfers: Sit to/from Stand Sit to Stand: Min assist         General transfer comment: Min A for safety from EOB to RW    Balance Overall balance assessment: Needs assistance Sitting-balance support: No upper extremity supported;Feet supported Sitting balance-Leahy  Scale: Good Sitting balance - Comments: relies on bedrail to maintain balance once in sitting   Standing balance support: Bilateral upper extremity supported;During functional activity Standing balance-Leahy Scale: Poor Standing balance comment: relies on RW for support in standing                            ADL Overall ADL's : Needs assistance/impaired     Grooming: Oral care;Min guard;Standing   Upper Body Bathing: Set up;Sitting   Lower Body Bathing: Minimal assistance;Sit to/from stand   Upper Body Dressing : Set up;Sitting   Lower Body Dressing: Moderate assistance;Sit to/from Archivist: Ambulation;RW;Minimal assistance   Toileting- Water quality scientist and Hygiene: Sit to/from stand;Minimal assistance       Functional mobility during ADLs: Minimal assistance;Rolling walker General ADL Comments: Educated pt on ADL post-operatively as well as fall prevention and use of ice for pain management.     Vision Vision Assessment?: No apparent visual deficits   Perception     Praxis      Pertinent Vitals/Pain Pain Assessment: Faces Pain Score: 7  Faces Pain Scale: Hurts even more Pain Location: L hip Pain Descriptors / Indicators: Aching;Shooting Pain Intervention(s): Monitored during session;Repositioned;Ice applied     Hand Dominance Right   Extremity/Trunk Assessment Upper Extremity Assessment Upper Extremity Assessment: Overall WFL for tasks assessed   Lower Extremity Assessment Lower Extremity Assessment: LLE deficits/detail LLE Deficits / Details: decreased strength and ROM as expected post-operatively.       Communication Communication Communication: No difficulties   Cognition Arousal/Alertness: Awake/alert Behavior During Therapy: [facility]  for tasks assessed/performed Overall Cognitive Status: Within Functional Limits for tasks assessed                     General Comments       Exercises Exercises: Total Joint      Shoulder Instructions      Home Living Family/patient expects to be discharged to:: Private residence Living Arrangements: Spouse/significant other;Other relatives Available Help at Discharge: Family;Friend(s);Available 24 hours/day Type of Home: House Home Access: Stairs to enter [employer] of Steps: 2 Entrance Stairs-Rails: None Home Layout: One level     Bathroom Shower/Tub: Tub/shower unit Shower/tub characteristics: Architectural technologist: Standard Bathroom Accessibility: Yes   Home Equipment: Environmental consultant - 2 wheels;Grab bars - toilet;Grab bars - tub/shower;Hand held shower head          Prior Functioning/Environment Level of Independence: Independent        Comments: independent, and 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         OT Problem List: Decreased strength;Decreased range of motion;Decreased activity tolerance;Impaired balance (sitting and/or standing);Decreased safety awareness;Decreased knowledge of use of DME or AE;Decreased knowledge of precautions;Pain   OT Treatment/Interventions: Self-care/ADL training;Therapeutic exercise;Therapeutic activities;Patient/family education;Balance training    OT Goals(Current goals can be found in the care plan section) Acute Rehab OT Goals Patient Stated Goal: to get home and have less pain OT Goal Formulation: With patient Time For Goal Achievement: 06/16/16 Potential to Achieve Goals: Good ADL Goals Pt Will Perform Lower Body Bathing: with modified independence;sit to/from stand;with adaptive equipment Pt Will Perform Lower Body Dressing: with modified independence;with adaptive equipment;sit to/from stand Pt Will Transfer to Toilet: with modified independence;ambulating;bedside commode Pt Will Perform Toileting - Clothing Manipulation and hygiene: with modified independence;sit to/from stand Pt Will Perform Tub/Shower Transfer: with supervision;Tub transfer;3 in 1;shower seat;rolling walker  OT Frequency: Min  2X/week   Barriers to D/C:            Co-evaluation              End of Session Equipment Utilized During Treatment: Gait belt;Rolling walker  Activity Tolerance: Patient tolerated treatment well Patient left: in bed;with call bell/phone within reach;with family/visitor present   Time: 1345-1405 OT Time Calculation (min): 20 min Charges:  OT General Charges $OT Visit: 1 Procedure OT Evaluation $OT Eval Moderate Complexity: 1 Procedure  Norman Herrlich, OTR/L [phone] 06/02/2016, 2:41 PM

## 2016-06-02 NOTE — Evaluation (Signed)
Physical Therapy Evaluation Patient Details Name: Kristin [name] MRN: [id number] DOB: [birth year] Today's Date: 06/02/2016   History of Present Illness  60 yo female admitted on 06/01/16 for left THA direct anterior approach. PMH significant for HTN, Anxiety and Depression  Clinical Impression  Pt presents POD 1 and is moving well with therapy. Pt is limited by pain and only able to ambulate 30' before having increased pain requiring her to return to her chair. Prior to admission, pt helped take care of her aunt who is disabled and performed all her errands and meal prep. Pt was completely independent including driving. Currently, pt presents with the below deficits and will benefit from acute services to address these deficits to assist with smooth transition home. Pt will benefit from having HHPT upon discharge.     Follow Up Recommendations Home health PT    Equipment Recommendations  None recommended by PT    Recommendations for Other Services       Precautions / Restrictions Precautions Precautions: Fall Precaution Comments: direct anterior Restrictions Weight Bearing Restrictions: Yes LLE Weight Bearing: Weight bearing as tolerated      Mobility  Bed Mobility Overal bed mobility: Needs Assistance Bed Mobility: Supine to Sit     Supine to sit: Min assist;HOB elevated     General bed mobility comments: Min A to move LLE OOB  Transfers Overall transfer level: Needs assistance Equipment used: Rolling walker (2 wheeled) Transfers: Sit to/from Stand Sit to Stand: Min assist         General transfer comment: Min A for safety from EOB to RW  Ambulation/Gait Ambulation/Gait assistance: Min assist Ambulation Distance (Feet): 30 Feet Assistive device: Rolling walker (2 wheeled) Gait Pattern/deviations: Decreased step length - right;Decreased stance time - left;Antalgic Gait velocity: decreased Gait velocity interpretation: Below normal speed for  age/gender General Gait Details: Mod antalgic gait noted with increased antalgia as distance increases, pt with increased c/o pain in right hip with gait as well  Stairs            Wheelchair Mobility    Modified Rankin (Stroke Patients Only)       Balance Overall balance assessment: Needs assistance Sitting-balance support: Single extremity supported Sitting balance-Leahy Scale: Fair Sitting balance - Comments: relies on bedrail to maintain balance once in sitting   Standing balance support: Bilateral upper extremity supported Standing balance-Leahy Scale: Poor Standing balance comment: relies on RW for support in standing                             Pertinent Vitals/Pain Pain Assessment: 0-10 Pain Score: 7  Pain Location: left hip Pain Descriptors / Indicators: Aching;Shooting Pain Intervention(s): Monitored during session;Premedicated before session;Limited activity within patient's tolerance;Ice applied    Home Living Family/patient expects to be discharged to:: Private residence Living Arrangements: Spouse/significant other;Other relatives Available Help at Discharge: Family;Friend(s) Type of Home: House Home Access: Stairs to enter Entrance Stairs-Rails: None Entrance Stairs-Number of Steps: 2 Home Layout: One level Home Equipment: Walker - 2 wheels;Grab bars - toilet;Grab bars - tub/shower;Hand held shower head      Prior Function Level of Independence: Independent         Comments: independent, and 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      Hand Dominance   Dominant Hand: Right    Extremity/Trunk Assessment   Upper Extremity Assessment: Defer to OT evaluation  Lower Extremity Assessment: LLE deficits/detail   LLE Deficits / Details: pt with normal post op pain and weakenss. At least 3/5 ankle, knee and hip per gross functional assessment     Communication   Communication: No difficulties  Cognition Arousal/Alertness:  Awake/alert Behavior During Therapy: WFL for tasks assessed/performed Overall Cognitive Status: Within Functional Limits for tasks assessed                      General Comments      Exercises Total Joint Exercises Ankle Circles/Pumps: AROM;Both;20 reps;Supine   Assessment/Plan    PT Assessment Patient needs continued PT services  PT Problem List Decreased strength;Decreased activity tolerance;Decreased balance;Decreased mobility;Decreased knowledge of use of DME;Pain          PT Treatment Interventions DME instruction;Gait training;Stair training;Functional mobility training;Therapeutic activities;Therapeutic exercise;Balance training;Patient/family education    PT Goals (Current goals can be found in the Care Plan section)  Acute Rehab PT Goals Patient Stated Goal: to get home and have less pain PT Goal Formulation: With patient Time For Goal Achievement: 06/09/16 Potential to Achieve Goals: Good    Frequency 7X/week   Barriers to discharge        Co-evaluation               End of Session Equipment Utilized During Treatment: Gait belt Activity Tolerance: Patient tolerated treatment well;Patient limited by pain Patient left: in chair;with call bell/phone within reach;with family/visitor present Nurse Communication: Mobility status         Time: [id number] PT Time Calculation (min) (ACUTE ONLY): 24 min   Charges:   PT Evaluation $PT Eval Low Complexity: 1 Procedure PT Treatments $Gait Training: 8-22 mins   PT G Codes:        Scheryl Marten PT, DPT  [phone]  06/02/2016, 1:20 PM

## 2016-06-02 NOTE — Progress Notes (Signed)
   Subjective:  Patient reports pain as mild.    Objective:   VITALS:   Vitals:   06/01/16 1817 06/01/16 2130 06/02/16 0033 06/02/16 0401  BP: 118/73 128/70 (!) 130/57 (!) 148/75  Pulse: 78 92 (!) 102 (!) 105  Resp: 18 17 18 18   Temp: 97.2 F (36.2 C) 97.5 F (36.4 C) 98 F (36.7 C) 97.7 F (36.5 C)  TempSrc: Oral Oral Oral Oral  SpO2: 93% 97% 100% 100%  Weight:        Neurologically intact Neurovascular intact Sensation intact distally Intact pulses distally Dorsiflexion/Plantar flexion intact Incision: dressing C/D/I and no drainage No cellulitis present Compartment soft   Lab Results  Component Value Date   WBC 7.2 06/02/2016   HGB 10.2 (L) 06/02/2016   HCT 31.6 (L) 06/02/2016   MCV 90.8 06/02/2016   PLT 226 06/02/2016     Assessment/Plan:  1 Day Post-Op   - Expected postop acute blood loss anemia - will monitor for symptoms - Up with PT/OT - DVT ppx - SCDs, ambulation, aspirin - WBAT operative extremity - Pain control - Discharge planning  Eduard Roux 06/02/2016, 8:15 AM [phone]

## 2016-06-02 NOTE — Progress Notes (Signed)
Physical Therapy Treatment Patient Details Name: Kristin [name] MRN: [id number] DOB: 12/23/1955 Today's Date: 06/02/2016    History of Present Illness 60 yo female admitted on 06/01/16 for left THA direct anterior approach. PMH significant for HTN, Anxiety and Depression    PT Comments    Pt demonstrates improved tolerance for gait activities this session. Performed supine exercises prior to gait and pt has decreased discomfort with gait as compared to previous session. Assisted pt to bathroom with CGA for safety getting onto and off commode. Pt has made improvements in second session as compared to AM session.    Follow Up Recommendations  Home health PT     Equipment Recommendations  None recommended by PT    Recommendations for Other Services       Precautions / Restrictions Precautions Precautions: Fall Precaution Comments: direct anterior Restrictions Weight Bearing Restrictions: Yes LLE Weight Bearing: Weight bearing as tolerated    Mobility  Bed Mobility Overal bed mobility: Needs Assistance Bed Mobility: Supine to Sit     Supine to sit: Min assist;HOB elevated Sit to supine: Mod assist;HOB elevated   General bed mobility comments: Min assist to move LLE OOB; required mod assist for supine to sit to raise B LE into bed.  Transfers Overall transfer level: Needs assistance Equipment used: Rolling walker (2 wheeled) Transfers: Sit to/from Stand Sit to Stand: Min assist         General transfer comment: Min A for safety from EOB to RW  Ambulation/Gait Ambulation/Gait assistance: Min guard Ambulation Distance (Feet): 50 Feet Assistive device: Rolling walker (2 wheeled) Gait Pattern/deviations: Step-through pattern;Decreased step length - right;Decreased stance time - left;Antalgic Gait velocity: decreased Gait velocity interpretation: Below normal speed for age/gender General Gait Details: Mod antalgic gait noted with increased antalgia as distance  increases, pt with increased c/o pain in right hip with gait as well   Stairs            Wheelchair Mobility    Modified Rankin (Stroke Patients Only)       Balance Overall balance assessment: Needs assistance Sitting-balance support: No upper extremity supported;Feet supported Sitting balance-Leahy Scale: Good Sitting balance - Comments: relies on bedrail to maintain balance once in sitting   Standing balance support: Bilateral upper extremity supported;During functional activity Standing balance-Leahy Scale: Poor Standing balance comment: relies on RW for support in standing                    Cognition Arousal/Alertness: Awake/alert Behavior During Therapy: WFL for tasks assessed/performed Overall Cognitive Status: Within Functional Limits for tasks assessed                      Exercises Total Joint Exercises Ankle Circles/Pumps: AROM;Both;20 reps;Supine Quad Sets: AROM;Left;10 reps;Supine Short Arc Quad: AROM;Left;10 reps;Supine Heel Slides: AROM;Left;10 reps;Supine Hip ABduction/ADduction: AROM;Left;10 reps;Supine    General Comments        Pertinent Vitals/Pain Pain Assessment: 0-10 Pain Score: 5  Faces Pain Scale: Hurts even more Pain Location: L Hip Pain Descriptors / Indicators: Aching;Shooting Pain Intervention(s): Monitored during session;Premedicated before session;Ice applied    Home Living Family/patient expects to be discharged to:: Private residence Living Arrangements: Spouse/significant other;Other relatives Available Help at Discharge: Family;Friend(s);Available 24 hours/day Type of Home: House Home Access: Stairs to enter Entrance Stairs-Rails: None Home Layout: One level Home Equipment: Walker - 2 wheels;Grab bars - toilet;Grab bars - tub/shower;Hand held shower head      Prior Function Level  of Independence: Independent      Comments: independent, and 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    PT Goals (current goals can now  be found in the care plan section) Acute Rehab PT Goals Patient Stated Goal: to get home and have less pain PT Goal Formulation: With patient Time For Goal Achievement: 06/09/16 Potential to Achieve Goals: Good Progress towards PT goals: Progressing toward goals    Frequency    7X/week      PT Plan Current plan remains appropriate    Co-evaluation             End of Session Equipment Utilized During Treatment: Gait belt Activity Tolerance: Patient tolerated treatment well;Patient limited by pain Patient left: in chair;with call bell/phone within reach     Time: 1520-1549 PT Time Calculation (min) (ACUTE ONLY): 29 min  Charges:  $Gait Training: 8-22 mins $Therapeutic Exercise: 8-22 mins                    G Codes:      Scheryl Marten PT, DPT  [phone]  06/02/2016, 3:55 PM

## 2016-06-03 ENCOUNTER — Telehealth (INDEPENDENT_AMBULATORY_CARE_PROVIDER_SITE_OTHER): Payer: Self-pay | Admitting: *Deleted

## 2016-06-03 LAB — GLUCOSE, CAPILLARY
GLUCOSE-CAPILLARY: 213 mg/dL — AB (ref 65–99)
Glucose-Capillary: 139 mg/dL — ABNORMAL HIGH (ref 65–99)

## 2016-06-03 NOTE — Discharge Summary (Signed)
Physician Discharge Summary      Patient ID: Kristin [name] MRN: [id number] DOB/AGE: 1955/08/20 60 y.o.  Admit date: 06/01/2016 Discharge date: 06/03/2016  Admission Diagnoses:  <principal problem not specified>  Discharge Diagnoses:  Active Problems:   Primary osteoarthritis of left hip   Hip joint replacement status   Past Medical History:  Diagnosis Date  . Allergy   . Anemia   . Anxiety   . Arthritis   . Back pain, chronic   . Bronchitis   . Cough   . Depression   . GERD (gastroesophageal reflux disease)   . Hyperlipidemia   . Hypertension   . Obesity   . 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 Seizures ([place])    last >10 yrs ago  . Somatization disorder   . Tobacco abuse     Surgeries: Procedure(s): LEFT TOTAL HIP ARTHROPLASTY ANTERIOR APPROACH on 06/01/2016   Consultants (if any):   Discharged Condition: Improved  Hospital Course: Kristin [name] is an 60 y.o. female who was admitted 06/01/2016 with a diagnosis of <principal problem not specified> and went to the operating room on 06/01/2016 and underwent the above named procedures.    She was given perioperative antibiotics:  Anti-infectives    Start     Dose/Rate Route Frequency Ordered Stop   06/01/16 2000  ceFAZolin (ANCEF) IVPB 2g/100 mL premix     2 g 200 mL/hr over 30 Minutes Intravenous Every 6 hours 06/01/16 1731 06/02/16 0135   06/01/16 1011  ceFAZolin (ANCEF) IVPB 2g/100 mL premix     2 g 200 mL/hr over 30 Minutes Intravenous On call to O.R. 06/01/16 1011 06/01/16 1420    .  She was given sequential compression devices, early ambulation, and aspirin for DVT prophylaxis.  She benefited maximally from the hospital stay and there were no complications.    Recent vital signs:  Vitals:   06/02/16 1950 06/03/16 0430  BP: 124/68 (!) 104/50  Pulse: (!) 110 96  Resp: 17 18  Temp: 98.6 F (37 C) 99 F (37.2 C)    Recent laboratory studies:  Lab Results  Component  Value Date   HGB 10.2 (L) 06/02/2016   HGB 12.0 05/23/2016   HGB 13.0 01/07/2016   Lab Results  Component Value Date   WBC 7.2 06/02/2016   PLT 226 06/02/2016   Lab Results  Component Value Date   INR 1.04 05/23/2016   Lab Results  Component Value Date   NA 135 06/02/2016   K 3.1 (L) 06/02/2016   CL 97 (L) 06/02/2016   CO2 30 06/02/2016   BUN 10 06/02/2016   CREATININE 0.97 06/02/2016   GLUCOSE 129 (H) 06/02/2016    Discharge Medications:     Medication List    TAKE these medications   amitriptyline 75 MG tablet Commonly known as:  ELAVIL Take 75 mg by mouth 2 (two) times daily.   aspirin EC 325 MG tablet Take 1 tablet (325 mg total) by mouth 2 (two) times daily.   cyclobenzaprine 10 MG tablet Commonly known as:  FLEXERIL Take 1 tablet (10 mg total) by mouth 3 (three) times daily as needed for muscle spasms.   hydrochlorothiazide 25 MG tablet Commonly known as:  HYDRODIURIL Take 1 tablet (25 mg total) by mouth daily.   lovastatin 40 MG tablet Commonly known as:  MEVACOR Take 1 tablet (40 mg total) by mouth at bedtime.   methocarbamol 750 MG tablet Commonly known  as:  ROBAXIN Take 1 tablet (750 mg total) by mouth 2 (two) times daily as needed for muscle spasms.   metoprolol tartrate 25 MG tablet Commonly known as:  LOPRESSOR TAKE ONE TABLET BY MOUTH TWICE DAILY   naproxen 500 MG tablet Commonly known as:  NAPROSYN TAKE ONE TABLET BY MOUTH TWICE DAILY BETWEEN  MEALS  AS  NEEDED  FOR  MILD  PAIN,  MODERATE  PAIN OR  HEADACHE. Take with food   omeprazole 20 MG capsule Commonly known as:  PRILOSEC Take 1 capsule (20 mg total) by mouth daily. What changed:  when to take this   omeprazole 20 MG capsule Commonly known as:  PRILOSEC TAKE ONE CAPSULE BY MOUTH ONCE DAILY AS NEEDED What changed:  You were already taking a medication with the same name, and this prescription was added. Make sure you understand how and when to take each.   ondansetron 4 MG  tablet Commonly known as:  ZOFRAN Take 1-2 tablets (4-8 mg total) by mouth every 8 (eight) hours as needed for nausea or vomiting.   oxyCODONE 10 mg 12 hr tablet Commonly known as:  OXYCONTIN Take 1 tablet (10 mg total) by mouth every 12 (twelve) hours.   oxyCODONE-acetaminophen 5-325 MG tablet Commonly known as:  PERCOCET Take 1-2 tablets by mouth every 4 (four) hours as needed for severe pain.   promethazine 25 MG tablet Commonly known as:  PHENERGAN Take 1 tablet (25 mg total) by mouth every 6 (six) hours as needed for nausea.            Durable Medical Equipment        Start     Ordered   06/01/16 1814  DME Walker rolling  Once    Question:  Patient needs a walker to treat with the following condition  Answer:  History of hip replacement   06/01/16 1813   06/01/16 1814  DME 3 n 1  Once     06/01/16 1813   06/01/16 1814  DME Bedside commode  Once    Question:  Patient needs a bedside commode to treat with the following condition  Answer:  History of hip replacement   06/01/16 1813      Diagnostic Studies: Dg Chest 2 View  Result Date: 05/23/2016 CLINICAL DATA:  Preoperative chest x-ray prior to left hip arthroplasty. Discontinued smoking 2 years ago. History of bronchitis and gastroesophageal reflux. EXAM: CHEST  2 VIEW COMPARISON:  Chest x-ray of December 12, 2014 FINDINGS: The lungs are reasonably well inflated and clear. The heart and pulmonary vascularity are normal. The mediastinum is normal in width. There is no pleural effusion. The bony thorax exhibits mild multilevel degenerative disc disease. IMPRESSION: There is no active cardiopulmonary disease. Electronically Signed   By: David  Martinique M.D.   On: 05/23/2016 11:42   Dg Pelvis Portable  Result Date: 06/01/2016 CLINICAL DATA:  Status post left hip arthroplasty EXAM: PORTABLE PELVIS 1-2 VIEWS COMPARISON:  Intraoperative images obtained earlier in the day FINDINGS: Frontal pelvis shows a total hip prosthesis on the  left which appears well seated. There is soft tissue air on the left, an expected postoperative finding. No acute fracture or dislocation. There is slight narrowing of the right hip joint. IMPRESSION: Left total hip prosthesis appears well seated. No fracture or dislocation. Mild narrowing right hip joint. Electronically Signed   By: Lowella Grip III M.D.   On: 06/01/2016 17:01   Dg C-arm 61-120 Min  Result Date:  06/01/2016 CLINICAL DATA:  Left total hip arthroplasty EXAM: OPERATIVE LEFT HIP 1 VIEW TECHNIQUE: Fluoroscopic spot image(s) were submitted for interpretation post-operatively. COMPARISON:  None. FLUOROSCOPY TIME:  0 minutes 37 seconds; 3 acquired images FINDINGS: Frontal view shows a total hip prosthesis on the left with prosthetic components well-seated. No fracture or dislocation evident. Air in the region is expected postoperatively. IMPRESSION: Left total hip prosthesis appears well-seated on frontal view. No evident fracture or dislocation on frontal view. Electronically Signed   By: Lowella Grip III M.D.   On: 06/01/2016 15:40   Dg Hip Operative Unilat With Pelvis Left  Result Date: 06/01/2016 CLINICAL DATA:  Left total hip arthroplasty EXAM: OPERATIVE LEFT HIP 1 VIEW TECHNIQUE: Fluoroscopic spot image(s) were submitted for interpretation post-operatively. COMPARISON:  None. FLUOROSCOPY TIME:  0 minutes 37 seconds; 3 acquired images FINDINGS: Frontal view shows a total hip prosthesis on the left with prosthetic components well-seated. No fracture or dislocation evident. Air in the region is expected postoperatively. IMPRESSION: Left total hip prosthesis appears well-seated on frontal view. No evident fracture or dislocation on frontal view. Electronically Signed   By: Lowella Grip III M.D.   On: 06/01/2016 15:40    Disposition: 01-Home or Self Care  Discharge Instructions    Call MD / Call 911    Complete by:  As directed    If you experience chest pain or shortness  of breath, CALL 911 and be transported to the hospital emergency room.  If you develope a fever above 101.5 F, pus (white drainage) or increased drainage or redness at the wound, or calf pain, call your surgeon's office.   Constipation Prevention    Complete by:  As directed    Drink plenty of fluids.  Prune juice may be helpful.  You may use a stool softener, such as Colace (over the counter) 100 mg twice a day.  Use MiraLax (over the counter) for constipation as needed.   Driving restrictions    Complete by:  As directed    No driving while taking narcotic pain meds.   Increase activity slowly as tolerated    Complete by:  As directed       Follow-up Information    Eduard Roux, MD Follow up in 2 week(s).   Specialty:  Orthopedic Surgery Why:  For suture removal, For wound re-check Contact information: [place] [address] [phone]        [facility] Follow up.   Why:  Someone from [facility] will contact you to arrange start date and time for therapy.  Contact information: [address] [place] [address] [phone]            Signed: Eduard Roux 06/03/2016, 10:35 AM

## 2016-06-03 NOTE — Progress Notes (Addendum)
Occupational Therapy Treatment/Discharge Patient Details Name: SAROJ STANARD MRN: [id number] DOB: 1955/12/12 Today's Date: 06/03/2016    History of present illness 60 yo female admitted on 06/01/16 for left THA direct anterior approach. PMH significant for HTN, Anxiety and Depression   OT comments  Pt progressing well toward OT goals. Able to complete tub transfer with tub bench with supervision and complete LB ADL with AE with supervision and set-up. Pt plans to D/C home with 24 hour assistance from family this afternoon. All education complete concerning ADL techniques and safety post-operatively.  Pt reports no further questions or concerns. D/C plan remains appropriate. OT will sign off acutely.  Follow Up Recommendations  No OT follow up;Supervision/Assistance - 24 hour    Equipment Recommendations  3 in 1 bedside comode    Recommendations for Other Services      Precautions / Restrictions Precautions Precautions: Fall Precaution Comments: direct anterior Restrictions Weight Bearing Restrictions: Yes LLE Weight Bearing: Weight bearing as tolerated       Mobility Bed Mobility               General bed mobility comments: Received in recliner  Transfers Overall transfer level: Needs assistance Equipment used: Rolling walker (2 wheeled) Transfers: Sit to/from Stand Sit to Stand: Supervision         General transfer comment: Supervision for safety    Balance Overall balance assessment: Needs assistance Sitting-balance support: No upper extremity supported;Feet supported Sitting balance-Leahy Scale: Good     Standing balance support: No upper extremity supported;During functional activity;Bilateral upper extremity supported Standing balance-Leahy Scale: Fair                     ADL Overall ADL's : Needs assistance/impaired                     Lower Body Dressing: Sit to/from stand;With adaptive equipment;Supervision/safety   Toilet  Transfer: Ambulation;RW;Supervision/safety       Tub/ Shower Transfer: Tub transfer;Supervision/safety;Tub bench;Ambulation;Rolling walker   Functional mobility during ADLs: Supervision/safety;Rolling walker General ADL Comments: Educated concerning safe tub transfer with tub bench.      Vision                     Perception     Praxis      Cognition   Behavior During Therapy: WFL for tasks assessed/performed Overall Cognitive Status: Within Functional Limits for tasks assessed                       Extremity/Trunk Assessment               Exercises     Shoulder Instructions       General Comments      Pertinent Vitals/ Pain       Pain Assessment: 0-10 Pain Score: 9  Pain Location: L hip Pain Descriptors / Indicators: Aching;Sore;Tightness Pain Intervention(s): Monitored during session;Premedicated before session;Repositioned;Ice applied  Home Living                                          Prior Functioning/Environment              Frequency  Min 2X/week        Progress Toward Goals  OT Goals(current goals can now be found in the care plan section)  Progress towards OT goals: Progressing toward goals  Acute Rehab OT Goals Patient Stated Goal: to get home and have less pain OT Goal Formulation: With patient Time For Goal Achievement: 06/16/16 Potential to Achieve Goals: Good ADL Goals Pt Will Perform Lower Body Bathing: with modified independence;sit to/from stand;with adaptive equipment Pt Will Perform Lower Body Dressing: with modified independence;with adaptive equipment;sit to/from stand Pt Will Transfer to Toilet: with modified independence;ambulating;bedside commode Pt Will Perform Toileting - Clothing Manipulation and hygiene: with modified independence;sit to/from stand Pt Will Perform Tub/Shower Transfer: with supervision;Tub transfer;3 in 1;shower seat;rolling walker  Plan Discharge plan  remains appropriate    Co-evaluation                 End of Session Equipment Utilized During Treatment: Gait belt;Rolling walker   Activity Tolerance Patient tolerated treatment well   Patient Left with call bell/phone within reach;in chair   Nurse Communication Mobility status        Time: [id number] OT Time Calculation (min): 20 min  Charges: OT General Charges $OT Visit: 1 Procedure OT Treatments $Self Care/Home Management : 8-22 mins  Norman Herrlich, OTR/L [phone] 06/03/2016, 12:41 PM

## 2016-06-03 NOTE — Progress Notes (Signed)
   Subjective:  Patient reports pain as mild.   Progressing with PT  Objective:   VITALS:   Vitals:   06/02/16 0401 06/02/16 1432 06/02/16 1950 06/03/16 0430  BP: (!) 148/75 115/68 124/68 (!) 104/50  Pulse: (!) 105 94 (!) 110 96  Resp: 18 18 17 18   Temp: 97.7 F (36.5 C) 99.1 F (37.3 C) 98.6 F (37 C) 99 F (37.2 C)  TempSrc: Oral Oral Oral Oral  SpO2: 100% 98% 95% 92%  Weight:        Neurologically intact Neurovascular intact Sensation intact distally Intact pulses distally Dorsiflexion/Plantar flexion intact Incision: dressing C/D/I and no drainage No cellulitis present Compartment soft   Lab Results  Component Value Date   WBC 7.2 06/02/2016   HGB 10.2 (L) 06/02/2016   HCT 31.6 (L) 06/02/2016   MCV 90.8 06/02/2016   PLT 226 06/02/2016     Assessment/Plan:  2 Days Post-Op   - one more PT session this afternoon then home  Eduard Roux 06/03/2016, 10:34 AM [phone]

## 2016-06-03 NOTE — Progress Notes (Signed)
Physical Therapy Treatment Patient Details Name: Kristin [name] MRN: [id number] DOB: October 31, 1955 Today's Date: 06/03/2016    History of Present Illness 60 yo female admitted on 06/01/16 for left THA direct anterior approach. PMH significant for HTN, Anxiety and Depression    PT Comments    Pt presents POD 2 with improved demeanor and decreased pain. Pt increased gait distance to 75' this session and and is able to perform all exercises with minimal assistance from clinician. Instructed pt on using gait belt to assist with heel slides and hip abd/add activities. Pt will benefit from one more therapy visit today prior to dc, but will possibly be ready for DC if cleared by MD today.    Follow Up Recommendations  Home health PT     Equipment Recommendations  None recommended by PT    Recommendations for Other Services       Precautions / Restrictions Precautions Precautions: Fall Precaution Comments: direct anterior Restrictions Weight Bearing Restrictions: Yes LLE Weight Bearing: Weight bearing as tolerated    Mobility  Bed Mobility Overal bed mobility: Needs Assistance Bed Mobility: Supine to Sit     Supine to sit: Supervision     General bed mobility comments: Sitting EOB, but got EOB without assistance this AM  Transfers Overall transfer level: Needs assistance Equipment used: Rolling walker (2 wheeled) Transfers: Sit to/from Stand Sit to Stand: Supervision         General transfer comment: Supervision for safety  Ambulation/Gait Ambulation/Gait assistance: Supervision Ambulation Distance (Feet): 75 Feet Assistive device: Rolling walker (2 wheeled) Gait Pattern/deviations: Step-through pattern;Decreased step length - right;Decreased stance time - left;Antalgic Gait velocity: decreased Gait velocity interpretation: Below normal speed for age/gender General Gait Details: Mild antalgic gait which improved this session as distance improved   Stairs             Wheelchair Mobility    Modified Rankin (Stroke Patients Only)       Balance Overall balance assessment: Needs assistance Sitting-balance support: No upper extremity supported;Feet supported Sitting balance-Leahy Scale: Good Sitting balance - Comments: No back support EOB   Standing balance support: No upper extremity supported Standing balance-Leahy Scale: Fair Standing balance comment: able to release RW for brief moment and can stabilize without UE support                    Cognition Arousal/Alertness: Awake/alert Behavior During Therapy: WFL for tasks assessed/performed Overall Cognitive Status: Within Functional Limits for tasks assessed                      Exercises Total Joint Exercises Ankle Circles/Pumps: AROM;Both;20 reps;Supine Quad Sets: AROM;Left;10 reps;Supine Short Arc Quad: AROM;Left;10 reps;Supine Heel Slides: AROM;Left;10 reps;Supine Hip ABduction/ADduction: AROM;Left;10 reps;Supine Long Arc Quad: AROM;Left;10 reps;Seated    General Comments        Pertinent Vitals/Pain Pain Assessment: 0-10 Pain Score: 6  Pain Location: L Hip Pain Descriptors / Indicators: Aching;Sore Pain Intervention(s): Monitored during session;Premedicated before session;Repositioned;Ice applied    Home Living                      Prior Function            PT Goals (current goals can now be found in the care plan section) Acute Rehab PT Goals Patient Stated Goal: to get home and have less pain Progress towards PT goals: Progressing toward goals    Frequency    7X/week  PT Plan Current plan remains appropriate    Co-evaluation             End of Session Equipment Utilized During Treatment: Gait belt Activity Tolerance: Patient tolerated treatment well Patient left: in chair;with call bell/phone within reach     Time: 0810-0840 PT Time Calculation (min) (ACUTE ONLY): 30 min  Charges:  $Gait Training: 8-22  mins $Therapeutic Exercise: 8-22 mins                    G Codes:      Scheryl Marten PT, DPT  [phone]  06/03/2016, 8:48 AM

## 2016-06-03 NOTE — Telephone Encounter (Signed)
Pt called stating she needed the generic version of her medications so she could afford them. [facility] on [address] CB: [phone]

## 2016-06-03 NOTE — Progress Notes (Signed)
Physical Therapy Treatment Patient Details Name: Kristin [name] MRN: [id number] DOB: September 24, 1955 Today's Date: 06/03/2016    History of Present Illness 60 yo female admitted on 06/01/16 for left THA direct anterior approach. PMH significant for HTN, Anxiety and Depression    PT Comments    Pt presents well in PM session. Performed gait training and assisted pt to bathroom with supervision for getting on and off commode with pt able to manage clothing without assistance. Pt continues to be limited by pain, but is making steady improvements with mobility. Advised pt to attempt to ambulate 1x an hour once getting home in order to improve mobility.   Follow Up Recommendations  Home health PT     Equipment Recommendations  None recommended by PT    Recommendations for Other Services       Precautions / Restrictions Precautions Precautions: Fall Precaution Comments: direct anterior Restrictions Weight Bearing Restrictions: Yes LLE Weight Bearing: Weight bearing as tolerated    Mobility  Bed Mobility               General bed mobility comments: Received in recliner  Transfers Overall transfer level: Needs assistance Equipment used: Rolling walker (2 wheeled) Transfers: Sit to/from Stand Sit to Stand: Supervision         General transfer comment: Supervision for safety  Ambulation/Gait Ambulation/Gait assistance: Supervision Ambulation Distance (Feet): 100 Feet Assistive device: Rolling walker (2 wheeled) Gait Pattern/deviations: Step-to pattern;Decreased step length - right;Decreased stance time - left;Antalgic Gait velocity: decreased Gait velocity interpretation: Below normal speed for age/gender General Gait Details: Mild antalgic gait which improved this session as distance improved   Stairs            Wheelchair Mobility    Modified Rankin (Stroke Patients Only)       Balance Overall balance assessment: Needs assistance Sitting-balance  support: No upper extremity supported;Feet supported Sitting balance-Leahy Scale: Good     Standing balance support: No upper extremity supported;During functional activity;Bilateral upper extremity supported Standing balance-Leahy Scale: Fair                      Cognition Arousal/Alertness: Awake/alert Behavior During Therapy: WFL for tasks assessed/performed Overall Cognitive Status: Within Functional Limits for tasks assessed                      Exercises      General Comments        Pertinent Vitals/Pain Pain Assessment: 0-10 Pain Score: 6  Pain Location: L hip Pain Descriptors / Indicators: Aching;Sore;Throbbing Pain Intervention(s): Monitored during session;Premedicated before session;Ice applied    Home Living                      Prior Function            PT Goals (current goals can now be found in the care plan section) Acute Rehab PT Goals Patient Stated Goal: to get home and have less pain Progress towards PT goals: Progressing toward goals    Frequency    7X/week      PT Plan Current plan remains appropriate    Co-evaluation             End of Session Equipment Utilized During Treatment: Gait belt Activity Tolerance: Patient tolerated treatment well Patient left: in chair;with call bell/phone within reach     Time: 1228-1241 PT Time Calculation (min) (ACUTE ONLY): 13 min  Charges:  $Gait  Training: 8-22 mins                    G Codes:      Scheryl Marten PT, DPT  [phone]  06/03/2016, 12:50 PM

## 2016-06-03 NOTE — Progress Notes (Signed)
Pt discharge education and instructions completed with pt at bedside; pt voices understanding and denies any questions. Pt IV removed; pt handed her prescriptions for aspirin, robaxin, Zofran, oxycodone, phenergan and percocet. Pt hip incision dsg remains clean, dry and intact. Pt discharge home with family to transport her home. Pt transported off unit via wheelchair with belongings to the side. Delia Heady RN

## 2016-06-07 ENCOUNTER — Ambulatory Visit (HOSPITAL_COMMUNITY): Payer: No Typology Code available for payment source | Attending: Family Medicine

## 2016-06-07 NOTE — Telephone Encounter (Signed)
Generic version of what med

## 2016-06-07 NOTE — Telephone Encounter (Signed)
See message below °

## 2016-06-08 NOTE — Telephone Encounter (Signed)
Called pt to advise

## 2016-06-08 NOTE — Telephone Encounter (Signed)
Called pt,  Called number below but no answer. Called alternative number. Please advise message below.  1. Oxycodone 10 mg (Would like another version that's cheaper b/c its $45)  2. Can she use regular Aspirin 81 mg if so how many time a day?

## 2016-06-08 NOTE — Telephone Encounter (Signed)
If she uses aspirin 81 mg then she has to take 4 tablets in the morning and 4 tablets at night.  There is not a cheaper option for oxycontin 10 but at this point, she doesn't need it.

## 2016-06-14 ENCOUNTER — Encounter (INDEPENDENT_AMBULATORY_CARE_PROVIDER_SITE_OTHER): Payer: Self-pay

## 2016-06-14 ENCOUNTER — Encounter (INDEPENDENT_AMBULATORY_CARE_PROVIDER_SITE_OTHER): Payer: Self-pay | Admitting: Orthopaedic Surgery

## 2016-06-14 ENCOUNTER — Ambulatory Visit (INDEPENDENT_AMBULATORY_CARE_PROVIDER_SITE_OTHER): Payer: Self-pay

## 2016-06-14 ENCOUNTER — Ambulatory Visit (INDEPENDENT_AMBULATORY_CARE_PROVIDER_SITE_OTHER): Payer: Self-pay | Admitting: Orthopaedic Surgery

## 2016-06-14 DIAGNOSIS — M1612 Unilateral primary osteoarthritis, left hip: Secondary | ICD-10-CM

## 2016-06-14 NOTE — Progress Notes (Signed)
Patient is 2 weeks status post left total hip replacement. She is advancing with physical therapy nicely. She is just taking extra strength Tylenol for pain. Walking with a walker. Incision is healed todayand infection. At this point were going to remove the sutures today continue with home health physical therapy. Neosporin to the skin abrasions. Follow-up in 4 weeks with AP pelvis standing continue aspirin for DVT prophylaxis. Questions encouraged and answered

## 2016-06-22 ENCOUNTER — Ambulatory Visit (HOSPITAL_COMMUNITY)
Admission: RE | Admit: 2016-06-22 | Discharge: 2016-06-22 | Disposition: A | Discharge status: Home / Self Care | Payer: No Typology Code available for payment source | Source: Ambulatory Visit | Attending: Family Medicine | Admitting: Family Medicine

## 2016-06-22 DIAGNOSIS — E041 Nontoxic single thyroid nodule: Secondary | ICD-10-CM

## 2016-06-23 ENCOUNTER — Telehealth: Payer: Self-pay | Admitting: Internal Medicine

## 2016-06-23 DIAGNOSIS — E041 Nontoxic single thyroid nodule: Secondary | ICD-10-CM

## 2016-06-23 NOTE — Telephone Encounter (Signed)
Called patient to discuss thyroid US results and the recommendation of FNA of one of the nodule. I plan to repeat TSH as it has not been doe since last year first prior to setting up FNA. Patient to call tomorrow to set up lab visit for blood draw. Future order TSH placed. Answered all questions

## 2016-06-30 ENCOUNTER — Other Ambulatory Visit: Payer: No Typology Code available for payment source

## 2016-06-30 DIAGNOSIS — E041 Nontoxic single thyroid nodule: Secondary | ICD-10-CM

## 2016-06-30 LAB — TSH: TSH: 0.78 mIU/L

## 2016-07-05 ENCOUNTER — Ambulatory Visit (INDEPENDENT_AMBULATORY_CARE_PROVIDER_SITE_OTHER): Payer: Self-pay

## 2016-07-05 ENCOUNTER — Encounter (INDEPENDENT_AMBULATORY_CARE_PROVIDER_SITE_OTHER): Payer: Self-pay | Admitting: Orthopaedic Surgery

## 2016-07-05 ENCOUNTER — Ambulatory Visit (INDEPENDENT_AMBULATORY_CARE_PROVIDER_SITE_OTHER): Payer: Self-pay | Admitting: Orthopaedic Surgery

## 2016-07-05 DIAGNOSIS — M1612 Unilateral primary osteoarthritis, left hip: Secondary | ICD-10-CM

## 2016-07-05 MED ORDER — DESONIDE 0.05 % EX OINT: 1.0000 "application " | TOPICAL_OINTMENT | Freq: Two times a day (BID) | CUTANEOUS | 2 refills | Status: DC

## 2016-07-05 NOTE — Progress Notes (Signed)
Patient comes back 1 week early for drainage from her left groin region.  Denies any f/c/n/v or recent illnesses or trauma.  Hip is nonsymptomatic.  Pain is improving and mild.  She noticed drainage on her underwear.  Physical exam shows that the surgical incision is nicely healed and movement of hip does not cause any pain.  No cellulitis or fluctuance.  She has a couple of patches of skin in her lower pannus that appears to have dermatitis with superficial excoriation.  xrays show stable THA.  She's walking with a cane.  Script for desonide for BID given.  No evidence of infection of joint or anywhere else.  F/u 2 months for recheck.  No xrays needed.  If dermatitis doesn't improve, she should see dermatologist or PCP.

## 2016-07-06 ENCOUNTER — Telehealth (INDEPENDENT_AMBULATORY_CARE_PROVIDER_SITE_OTHER): Payer: Self-pay | Admitting: Orthopaedic Surgery

## 2016-07-06 NOTE — Telephone Encounter (Signed)
Please advise 

## 2016-07-06 NOTE — Telephone Encounter (Signed)
Patient calling today about desonide cream that was called into her pharmacy yesterday. She can not afford this. It was costing 52 dollars. She is wanting to get something for 10 dollars or less. Can you please advise possible alternative? Advised her [name] is in surgery right now.  CB# [phone]

## 2016-07-06 NOTE — Telephone Encounter (Signed)
Tell her to try hydrocortisone 1% OTC cream.  She can get that at any pharmacy

## 2016-07-07 ENCOUNTER — Telehealth: Payer: Self-pay | Admitting: Internal Medicine

## 2016-07-07 DIAGNOSIS — E042 Nontoxic multinodular goiter: Secondary | ICD-10-CM

## 2016-07-07 NOTE — Telephone Encounter (Signed)
Called both numbers no answer LMOM with details see message below.

## 2016-07-07 NOTE — Telephone Encounter (Signed)
Called patient to report of normal TSH. We discussed that FNA is still indicated. Will place order. Will also route to nursing (I am not sure if our nursing staff needs to call to get this se up for the patient or the facility that will complete the procedure will call the patient).   Patient understands plan.

## 2016-07-08 NOTE — Telephone Encounter (Signed)
appt on 07/14/16. Kristin [name],CMA

## 2016-07-12 ENCOUNTER — Ambulatory Visit (INDEPENDENT_AMBULATORY_CARE_PROVIDER_SITE_OTHER): Payer: No Typology Code available for payment source | Admitting: Orthopaedic Surgery

## 2016-07-14 ENCOUNTER — Ambulatory Visit
Admission: RE | Admit: 2016-07-14 | Discharge: 2016-07-14 | Disposition: A | Discharge status: Home / Self Care | Payer: No Typology Code available for payment source | Source: Ambulatory Visit | Attending: Family Medicine | Admitting: Family Medicine

## 2016-07-14 ENCOUNTER — Other Ambulatory Visit (HOSPITAL_COMMUNITY)
Admission: RE | Admit: 2016-07-14 | Discharge: 2016-07-14 | Disposition: A | Discharge status: Home / Self Care | Payer: No Typology Code available for payment source | Source: Ambulatory Visit | Attending: Physician Assistant | Admitting: Physician Assistant

## 2016-07-14 DIAGNOSIS — E041 Nontoxic single thyroid nodule: Secondary | ICD-10-CM | POA: Insufficient documentation

## 2016-07-14 DIAGNOSIS — E042 Nontoxic multinodular goiter: Secondary | ICD-10-CM

## 2016-07-14 NOTE — Procedures (Signed)
PROCEDURE SUMMARY:  Using direct ultrasound guidance, 4 passes were made using 25 g needles into the nodule within the right lobe of the thyroid.   Ultrasound was used to confirm needle placements on all occasions.   Specimens were sent to Pathology for analysis.  Arty Lantzy S Yerick Eggebrecht PA-C 07/14/2016 1:56 PM

## 2016-07-19 ENCOUNTER — Ambulatory Visit (INDEPENDENT_AMBULATORY_CARE_PROVIDER_SITE_OTHER): Payer: Self-pay | Admitting: Orthopedic Surgery

## 2016-07-19 ENCOUNTER — Ambulatory Visit (INDEPENDENT_AMBULATORY_CARE_PROVIDER_SITE_OTHER): Payer: Self-pay

## 2016-07-19 ENCOUNTER — Telehealth: Payer: Self-pay | Admitting: Internal Medicine

## 2016-07-19 ENCOUNTER — Encounter (INDEPENDENT_AMBULATORY_CARE_PROVIDER_SITE_OTHER): Payer: Self-pay | Admitting: Orthopedic Surgery

## 2016-07-19 DIAGNOSIS — M25552 Pain in left hip: Secondary | ICD-10-CM

## 2016-07-19 DIAGNOSIS — E041 Nontoxic single thyroid nodule: Secondary | ICD-10-CM

## 2016-07-19 DIAGNOSIS — Z96642 Presence of left artificial hip joint: Secondary | ICD-10-CM

## 2016-07-19 LAB — CBC WITH DIFFERENTIAL/PLATELET
BASOS ABS: 0 {cells}/uL (ref 0–200)
Basophils Relative: 0 %
EOS PCT: 1 %
Eosinophils Absolute: 110 cells/uL (ref 15–500)
HCT: 34.3 % — ABNORMAL LOW (ref 35.0–45.0)
Hemoglobin: 11 g/dL — ABNORMAL LOW (ref 11.7–15.5)
Lymphocytes Relative: 13 %
Lymphs Abs: 1430 cells/uL (ref 850–3900)
MCH: 29.1 pg (ref 27.0–33.0)
MCHC: 32.1 g/dL (ref 32.0–36.0)
MCV: 90.7 fL (ref 80.0–100.0)
MONOS PCT: 8 %
MPV: 9 fL (ref 7.5–12.5)
Monocytes Absolute: 880 cells/uL (ref 200–950)
NEUTROS PCT: 78 %
Neutro Abs: 8580 cells/uL — ABNORMAL HIGH (ref 1500–7800)
PLATELETS: 337 10*3/uL (ref 140–400)
RBC: 3.78 MIL/uL — ABNORMAL LOW (ref 3.80–5.10)
RDW: 13.8 % (ref 11.0–15.0)
WBC: 11 10*3/uL — ABNORMAL HIGH (ref 3.8–10.8)

## 2016-07-19 MED ORDER — OXYCODONE-ACETAMINOPHEN 5-325 MG PO TABS: 1.0000 | ORAL_TABLET | ORAL | 0 refills | Status: DC | PRN

## 2016-07-19 NOTE — Telephone Encounter (Signed)
Called patient to report of FNA results of benign follicular nodule. Per Uptodate, will need  follow up Thyroid US in 12-24 months to assess stability. Patient understood

## 2016-07-19 NOTE — Progress Notes (Signed)
Post-Op Visit Note   Patient: Kristin [name]           Date of Birth: Nov 11, 1955           MRN: [id number] Visit Date: 07/19/2016 PCP: Smiley Houseman, MD   Assessment & Plan:  Chief Complaint:  Chief Complaint  Patient presents with  . Left Hip - Follow-up  . Follow-up   Visit Diagnoses:  1. Left hip pain     Plan: [name] is a 61 year old patient who is 6 weeks out left total hip replacement.  She was doing well for a time but then she had an increase in pain.  She'll also having some chills.  That started yesterday.  No history of injury.  She did have 1 spot at the proximal aspect of the incision, which had been draining before.  I examined that today and it is not draining currently.  Distal part of the incision has some induration which I would expect post surgery.  I cannot express any drainage at all from the incision.  There is a pinhole site at the crease of the proximal aspect of the incision the.  Radiographs are normal.  Impression is patient was doing well from her hip replacement and now is having increasing pain and 1 day of chills.  Plan is to draw some labs for baseline.  I don't know if the hip needs an aspiration at this time.  We will let [name] make that decision.  Baseline sedimentation rate C-reactive protein and CBC differential are ordered today to serve as a point of reference for later studies if needed.  I'm the right or a prescription for pain medicine.  Follow-up with [name] this week  Follow-Up Instructions: No Follow-up on file.   Orders:  Orders Placed This Encounter  Procedures  . XR HIP UNILAT W OR W/O PELVIS 2-3 VIEWS LEFT   No orders of the defined types were placed in this encounter.   Imaging: No results found.  PMFS History: Patient Active Problem List   Diagnosis Date Noted  . 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 Sinus tachycardia 05/19/2016  . History of adenomatous polyp of colon 03/21/2016  . Diabetes ([place])  01/21/2016  . Thyroid nodule 12/25/2014  . ARDS (adult respiratory distress syndrome) ([place])   . Infiltrate noted on imaging study   . Acute respiratory failure with hypoxia ([place])   . Pharyngeal swelling   . Tonsillar abscess 12/06/2014  . Tonsil, abscess 12/06/2014  . Cervical spine arthritis ([place]) 11/14/2014  . Primary osteoarthritis of left hip 11/14/2014  . Loss of consciousness ([place]) 11/14/2014  . Cough 05/26/2014  . Musculoskeletal chest and rib pain 03/26/2014  . Rib pain on right side 02/28/2014  . Groin pain 04/18/2013  . Tension headache 12/22/2012  . UNSPECIFIED RENAL SCLEROSIS 01/16/2009  . GERD, SEVERE 05/25/2007  . OBESITY NOS 04/03/2007  . HYPERLIPIDEMIA 08/31/2006  . Major depression, recurrent ([place]) 08/31/2006  . 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 Former smoker 08/31/2006  . HYPERTENSION, BENIGN SYSTEMIC 08/31/2006  . GASTRIC ULCER ACUTE WITHOUT HEMORRHAGE 08/31/2006  . CONVULSIONS, SEIZURES, NOS 08/31/2006   Past Medical History:  Diagnosis Date  . Allergy   . Anemia   . Anxiety   . Arthritis   . Back pain, chronic   . Bronchitis   . Cough   . Depression   . GERD (gastroesophageal reflux disease)   . Hyperlipidemia   . Hypertension   . Obesity   .  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 Seizures ([place])    last >10 yrs ago  . Somatization disorder   . Tobacco abuse     Family History  Problem Relation Age of Onset  . Other Mother     failed surgery  . Heart Problems Father     poss MI, not sure:per pt  . Alzheimer's disease Maternal Grandmother   . Heart disease Maternal Grandfather   . Colon cancer Maternal Uncle     not sure age of onset    Past Surgical History:  Procedure Laterality Date  . ABDOMINAL HYSTERECTOMY    . CHOLECYSTECTOMY    . kidney stent Right 2002  . TONSILLECTOMY Bilateral 12/06/2014   Procedure: TONSILLECTOMY, BILATERAL;  Surgeon: Ruby Cola, MD;  Location: [facility];  Service: ENT;  Laterality: Bilateral;   . TONSILLECTOMY    . TOTAL HIP ARTHROPLASTY Left 06/01/2016   Procedure: LEFT TOTAL HIP ARTHROPLASTY ANTERIOR APPROACH;  Surgeon: Leandrew Koyanagi, MD;  Location: [facility];  Service: Orthopedics;  Laterality: Left;  . TUBAL LIGATION     Social History   Occupational History  . Not on file.   Social History Main Topics  . Smoking status: Former Smoker    Packs/day: 0.25    Years: 0.50    Types: Cigarettes    Quit date: 01/27/2014  . Smokeless tobacco: Never Used  . Alcohol use No  . Drug use: No  . Sexual activity: Not on file

## 2016-07-19 NOTE — Addendum Note (Signed)
Addended byBrand Males on: 07/19/2016 11:13 AM   Modules accepted: Orders

## 2016-07-20 LAB — SEDIMENTATION RATE: SED RATE: 71 mm/h — AB (ref 0–30)

## 2016-07-20 LAB — C-REACTIVE PROTEIN: CRP: 258.5 mg/L — AB (ref <8.0)

## 2016-07-22 ENCOUNTER — Ambulatory Visit (INDEPENDENT_AMBULATORY_CARE_PROVIDER_SITE_OTHER): Payer: Self-pay | Admitting: Orthopaedic Surgery

## 2016-07-22 ENCOUNTER — Encounter (INDEPENDENT_AMBULATORY_CARE_PROVIDER_SITE_OTHER): Payer: Self-pay

## 2016-07-22 DIAGNOSIS — Z96642 Presence of left artificial hip joint: Secondary | ICD-10-CM

## 2016-07-22 NOTE — Progress Notes (Signed)
Patient comes back today for reexamination of her left hip pain. She denies history of gout. She has has significant pain of her left hip for about a week. She was examined by Dr. Dean earlier this week and baseline labs were drawn. She has not had any antibiotics in the postoperative period. She endorses chills but denies any fevers. She denies any drainage. She is 7 weeks status post left total hip replacement with uneventful postoperative course until now. X-rays were stable from the previous visit just a couple days ago. CRP, ESR, white blood cell count are all elevated. On exam she does have pain with palpation in the thigh. The incision is indurated but with large soft tissue envelope would expect this amount of induration. The surgical scar is completely healed. The left thigh is not significantly warmer than the right thigh. I do not appreciate any erythema or cellulitis. She has a lot of pain and difficulty with movement of the hip or with weightbearing. I am going to set her up with a hip aspiration with Dr. Newton as soon as possible to evaluate for possible deep space infection. 

## 2016-07-25 ENCOUNTER — Encounter (INDEPENDENT_AMBULATORY_CARE_PROVIDER_SITE_OTHER): Payer: Self-pay | Admitting: Physical Medicine and Rehabilitation

## 2016-07-25 ENCOUNTER — Inpatient Hospital Stay (HOSPITAL_COMMUNITY): Payer: Self-pay | Admitting: Anesthesiology

## 2016-07-25 ENCOUNTER — Encounter (HOSPITAL_COMMUNITY): Payer: Self-pay | Admitting: *Deleted

## 2016-07-25 ENCOUNTER — Inpatient Hospital Stay (HOSPITAL_COMMUNITY)
Admission: RE | Admit: 2016-07-25 | Discharge: 2016-08-05 | DRG: 464 | Disposition: A | Discharge status: Home Health | Payer: Self-pay | Source: Ambulatory Visit | Attending: Orthopaedic Surgery | Admitting: Orthopaedic Surgery

## 2016-07-25 ENCOUNTER — Other Ambulatory Visit (INDEPENDENT_AMBULATORY_CARE_PROVIDER_SITE_OTHER): Payer: Self-pay | Admitting: Orthopaedic Surgery

## 2016-07-25 ENCOUNTER — Ambulatory Visit (INDEPENDENT_AMBULATORY_CARE_PROVIDER_SITE_OTHER): Payer: Self-pay | Admitting: Physical Medicine and Rehabilitation

## 2016-07-25 ENCOUNTER — Encounter (INDEPENDENT_AMBULATORY_CARE_PROVIDER_SITE_OTHER): Payer: Self-pay

## 2016-07-25 ENCOUNTER — Inpatient Hospital Stay (HOSPITAL_COMMUNITY): Payer: Self-pay

## 2016-07-25 ENCOUNTER — Encounter (HOSPITAL_COMMUNITY)
Admission: RE | Disposition: A | Discharge status: Home Health | Payer: Self-pay | Source: Ambulatory Visit | Attending: Orthopaedic Surgery

## 2016-07-25 VITALS — BP 133/75

## 2016-07-25 DIAGNOSIS — Y92230 Patient room in hospital as the place of occurrence of the external cause: Secondary | ICD-10-CM | POA: Diagnosis not present

## 2016-07-25 DIAGNOSIS — Y831 Surgical operation with implant of artificial internal device as the cause of abnormal reaction of the patient, or of later complication, without mention of misadventure at the time of the procedure: Secondary | ICD-10-CM | POA: Diagnosis present

## 2016-07-25 DIAGNOSIS — Z96649 Presence of unspecified artificial hip joint: Secondary | ICD-10-CM

## 2016-07-25 DIAGNOSIS — T8452XA Infection and inflammatory reaction due to internal left hip prosthesis, initial encounter: Principal | ICD-10-CM | POA: Diagnosis present

## 2016-07-25 DIAGNOSIS — Z8249 Family history of ischemic heart disease and other diseases of the circulatory system: Secondary | ICD-10-CM

## 2016-07-25 DIAGNOSIS — E785 Hyperlipidemia, unspecified: Secondary | ICD-10-CM | POA: Diagnosis present

## 2016-07-25 DIAGNOSIS — Z888 Allergy status to other drugs, medicaments and biological substances status: Secondary | ICD-10-CM

## 2016-07-25 DIAGNOSIS — R2689 Other abnormalities of gait and mobility: Secondary | ICD-10-CM

## 2016-07-25 DIAGNOSIS — Y838 Other surgical procedures as the cause of abnormal reaction of the patient, or of later complication, without mention of misadventure at the time of the procedure: Secondary | ICD-10-CM | POA: Diagnosis not present

## 2016-07-25 DIAGNOSIS — F419 Anxiety disorder, unspecified: Secondary | ICD-10-CM | POA: Diagnosis present

## 2016-07-25 DIAGNOSIS — Z419 Encounter for procedure for purposes other than remedying health state, unspecified: Secondary | ICD-10-CM

## 2016-07-25 DIAGNOSIS — I10 Essential (primary) hypertension: Secondary | ICD-10-CM | POA: Diagnosis present

## 2016-07-25 DIAGNOSIS — K219 Gastro-esophageal reflux disease without esophagitis: Secondary | ICD-10-CM | POA: Diagnosis present

## 2016-07-25 DIAGNOSIS — M25552 Pain in left hip: Secondary | ICD-10-CM

## 2016-07-25 DIAGNOSIS — B9562 Methicillin resistant Staphylococcus aureus infection as the cause of diseases classified elsewhere: Secondary | ICD-10-CM | POA: Diagnosis present

## 2016-07-25 DIAGNOSIS — Z79899 Other long term (current) drug therapy: Secondary | ICD-10-CM

## 2016-07-25 DIAGNOSIS — Z6841 Body Mass Index (BMI) 40.0 and over, adult: Secondary | ICD-10-CM

## 2016-07-25 DIAGNOSIS — Z90710 Acquired absence of both cervix and uterus: Secondary | ICD-10-CM

## 2016-07-25 DIAGNOSIS — Z87891 Personal history of nicotine dependence: Secondary | ICD-10-CM

## 2016-07-25 DIAGNOSIS — D62 Acute posthemorrhagic anemia: Secondary | ICD-10-CM | POA: Diagnosis not present

## 2016-07-25 DIAGNOSIS — F329 Major depressive disorder, single episode, unspecified: Secondary | ICD-10-CM | POA: Diagnosis present

## 2016-07-25 DIAGNOSIS — Z96642 Presence of left artificial hip joint: Secondary | ICD-10-CM | POA: Diagnosis present

## 2016-07-25 DIAGNOSIS — T8131XA Disruption of external operation (surgical) wound, not elsewhere classified, initial encounter: Secondary | ICD-10-CM | POA: Diagnosis not present

## 2016-07-25 HISTORY — PX: ANTERIOR HIP REVISION: SHX6527

## 2016-07-25 LAB — CBC
HCT: 28.2 % — ABNORMAL LOW (ref 36.0–46.0)
Hemoglobin: 9.2 g/dL — ABNORMAL LOW (ref 12.0–15.0)
MCH: 29 pg (ref 26.0–34.0)
MCHC: 32.6 g/dL (ref 30.0–36.0)
MCV: 89 fL (ref 78.0–100.0)
Platelets: 336 10*3/uL (ref 150–400)
RBC: 3.17 MIL/uL — ABNORMAL LOW (ref 3.87–5.11)
RDW: 14.7 % (ref 11.5–15.5)
WBC: 17.3 10*3/uL — ABNORMAL HIGH (ref 4.0–10.5)

## 2016-07-25 LAB — BASIC METABOLIC PANEL
ANION GAP: 9 (ref 5–15)
BUN: 10 mg/dL (ref 6–20)
CO2: 32 mmol/L (ref 22–32)
Calcium: 9 mg/dL (ref 8.9–10.3)
Chloride: 94 mmol/L — ABNORMAL LOW (ref 101–111)
Creatinine, Ser: 0.95 mg/dL (ref 0.44–1.00)
GFR calc Af Amer: >60 mL/min (ref >60)
GLUCOSE: 133 mg/dL — AB (ref 65–99)
POTASSIUM: 3.8 mmol/L (ref 3.5–5.1)
Sodium: 135 mmol/L (ref 135–145)

## 2016-07-25 LAB — PREPARE RBC (CROSSMATCH)

## 2016-07-25 SURGERY — REVISION, TOTAL ARTHROPLASTY, HIP, ANTERIOR APPROACH
Anesthesia: General | Site: Hip | Laterality: Left

## 2016-07-25 MED ORDER — SODIUM CHLORIDE 0.9 % IV SOLN
INTRAVENOUS | Status: DC | PRN
  Administered 2016-07-25: 19:00:00 via INTRAVENOUS

## 2016-07-25 MED ORDER — SODIUM CHLORIDE 0.9 % IV SOLN: Freq: Once | INTRAVENOUS | Status: DC

## 2016-07-25 MED ORDER — PROPOFOL 10 MG/ML IV BOLUS
INTRAVENOUS | Status: AC
  Filled 2016-07-25: qty 20

## 2016-07-25 MED ORDER — GLYCOPYRROLATE 0.2 MG/ML IJ SOLN
INTRAMUSCULAR | Status: DC | PRN
  Administered 2016-07-25: 0.6 mg via INTRAVENOUS

## 2016-07-25 MED ORDER — ROCURONIUM BROMIDE 100 MG/10ML IV SOLN
INTRAVENOUS | Status: DC | PRN
  Administered 2016-07-25: 50 mg via INTRAVENOUS

## 2016-07-25 MED ORDER — METOPROLOL TARTRATE 25 MG PO TABS
25.0000 mg | ORAL_TABLET | Freq: Two times a day (BID) | ORAL | Status: DC
  Administered 2016-07-25 – 2016-08-05 (×22): 25 mg via ORAL
  Filled 2016-07-25 (×22): qty 1

## 2016-07-25 MED ORDER — ONDANSETRON HCL 4 MG PO TABS: 4.0000 mg | ORAL_TABLET | Freq: Four times a day (QID) | ORAL | Status: DC | PRN

## 2016-07-25 MED ORDER — METOCLOPRAMIDE HCL 5 MG PO TABS
5.0000 mg | ORAL_TABLET | Freq: Three times a day (TID) | ORAL | Status: DC | PRN
  Filled 2016-07-25: qty 2

## 2016-07-25 MED ORDER — CYCLOBENZAPRINE HCL 10 MG PO TABS
10.0000 mg | ORAL_TABLET | Freq: Three times a day (TID) | ORAL | Status: DC | PRN
  Administered 2016-07-25 – 2016-07-30 (×3): 10 mg via ORAL
  Filled 2016-07-25 (×3): qty 1

## 2016-07-25 MED ORDER — ONDANSETRON HCL 4 MG/2ML IJ SOLN: 4.0000 mg | Freq: Four times a day (QID) | INTRAMUSCULAR | Status: DC | PRN

## 2016-07-25 MED ORDER — VANCOMYCIN HCL IN DEXTROSE 750-5 MG/150ML-% IV SOLN
750.0000 mg | Freq: Two times a day (BID) | INTRAVENOUS | Status: DC
  Administered 2016-07-26 – 2016-07-30 (×8): 750 mg via INTRAVENOUS
  Filled 2016-07-25 (×9): qty 150

## 2016-07-25 MED ORDER — OXYCODONE HCL 5 MG PO TABS
10.0000 mg | ORAL_TABLET | Freq: Once | ORAL | Status: AC
  Administered 2016-07-25: 10 mg via ORAL

## 2016-07-25 MED ORDER — ONDANSETRON HCL 4 MG PO TABS: 4.0000 mg | ORAL_TABLET | Freq: Three times a day (TID) | ORAL | Status: DC | PRN

## 2016-07-25 MED ORDER — ACETAMINOPHEN 500 MG PO TABS
1000.0000 mg | ORAL_TABLET | Freq: Four times a day (QID) | ORAL | Status: AC
  Administered 2016-07-26 (×3): 1000 mg via ORAL
  Filled 2016-07-25 (×3): qty 2

## 2016-07-25 MED ORDER — OXYCODONE-ACETAMINOPHEN 5-325 MG PO TABS
1.0000 | ORAL_TABLET | ORAL | Status: DC | PRN
  Administered 2016-07-26: 2 via ORAL
  Administered 2016-07-27: 1 via ORAL
  Administered 2016-07-28 – 2016-08-05 (×22): 2 via ORAL
  Filled 2016-07-25 (×24): qty 2

## 2016-07-25 MED ORDER — ACETAMINOPHEN 500 MG PO TABS: 500.0000 mg | ORAL_TABLET | Freq: Four times a day (QID) | ORAL | Status: DC | PRN

## 2016-07-25 MED ORDER — PIPERACILLIN-TAZOBACTAM 3.375 G IVPB 30 MIN
3.3750 g | INTRAVENOUS | Status: AC
  Administered 2016-07-25: 3.375 g via INTRAVENOUS
  Filled 2016-07-25: qty 50

## 2016-07-25 MED ORDER — NEOSTIGMINE METHYLSULFATE 5 MG/5ML IV SOSY
PREFILLED_SYRINGE | INTRAVENOUS | Status: AC
  Filled 2016-07-25: qty 5

## 2016-07-25 MED ORDER — METOPROLOL TARTRATE 12.5 MG HALF TABLET
ORAL_TABLET | ORAL | Status: AC
  Administered 2016-07-25: 25 mg via ORAL
  Filled 2016-07-25: qty 1

## 2016-07-25 MED ORDER — OXYCODONE HCL ER 10 MG PO T12A
10.0000 mg | EXTENDED_RELEASE_TABLET | Freq: Two times a day (BID) | ORAL | Status: DC
  Administered 2016-07-25 – 2016-08-05 (×22): 10 mg via ORAL
  Filled 2016-07-25 (×22): qty 1

## 2016-07-25 MED ORDER — TRANEXAMIC ACID 1000 MG/10ML IV SOLN
1000.0000 mg | INTRAVENOUS | Status: AC
  Administered 2016-07-25: 1000 mg via INTRAVENOUS
  Filled 2016-07-25: qty 10

## 2016-07-25 MED ORDER — LACTATED RINGERS IV SOLN
INTRAVENOUS | Status: DC
  Administered 2016-07-25 (×4): via INTRAVENOUS

## 2016-07-25 MED ORDER — POLYETHYLENE GLYCOL 3350 17 G PO PACK
17.0000 g | PACK | Freq: Every day | ORAL | Status: DC | PRN
  Administered 2016-07-28 – 2016-07-29 (×2): 17 g via ORAL
  Filled 2016-07-25 (×2): qty 1

## 2016-07-25 MED ORDER — ACETAMINOPHEN 650 MG RE SUPP: 650.0000 mg | Freq: Four times a day (QID) | RECTAL | Status: DC | PRN

## 2016-07-25 MED ORDER — NEOSTIGMINE METHYLSULFATE 10 MG/10ML IV SOLN
INTRAVENOUS | Status: DC | PRN
  Administered 2016-07-25: 4 mg via INTRAVENOUS

## 2016-07-25 MED ORDER — LIDOCAINE HCL (CARDIAC) 20 MG/ML IV SOLN
INTRAVENOUS | Status: DC | PRN
  Administered 2016-07-25: 60 mg via INTRAVENOUS

## 2016-07-25 MED ORDER — ACETAMINOPHEN 325 MG PO TABS
650.0000 mg | ORAL_TABLET | Freq: Four times a day (QID) | ORAL | Status: DC | PRN
  Administered 2016-07-28: 650 mg via ORAL
  Filled 2016-07-25: qty 2

## 2016-07-25 MED ORDER — DEXAMETHASONE SODIUM PHOSPHATE 10 MG/ML IJ SOLN
INTRAMUSCULAR | Status: AC
  Filled 2016-07-25: qty 1

## 2016-07-25 MED ORDER — FENTANYL CITRATE (PF) 100 MCG/2ML IJ SOLN
INTRAMUSCULAR | Status: AC
  Administered 2016-07-25: 50 ug via INTRAVENOUS
  Filled 2016-07-25: qty 2

## 2016-07-25 MED ORDER — PROPOFOL 10 MG/ML IV BOLUS
INTRAVENOUS | Status: DC | PRN
  Administered 2016-07-25: 100 mg via INTRAVENOUS
  Administered 2016-07-25: 20 mg via INTRAVENOUS

## 2016-07-25 MED ORDER — ASPIRIN EC 325 MG PO TBEC: 325.0000 mg | DELAYED_RELEASE_TABLET | Freq: Two times a day (BID) | ORAL | Status: DC

## 2016-07-25 MED ORDER — METHOCARBAMOL 500 MG PO TABS
500.0000 mg | ORAL_TABLET | Freq: Four times a day (QID) | ORAL | Status: DC | PRN
  Administered 2016-07-25 – 2016-08-05 (×11): 500 mg via ORAL
  Filled 2016-07-25 (×10): qty 1

## 2016-07-25 MED ORDER — PHENYLEPHRINE 40 MCG/ML (10ML) SYRINGE FOR IV PUSH (FOR BLOOD PRESSURE SUPPORT)
PREFILLED_SYRINGE | INTRAVENOUS | Status: AC
  Filled 2016-07-25: qty 10

## 2016-07-25 MED ORDER — HYDROCHLOROTHIAZIDE 25 MG PO TABS
25.0000 mg | ORAL_TABLET | Freq: Every day | ORAL | Status: DC
  Administered 2016-07-25 – 2016-08-05 (×12): 25 mg via ORAL
  Filled 2016-07-25 (×12): qty 1

## 2016-07-25 MED ORDER — PHENYLEPHRINE HCL 10 MG/ML IJ SOLN
INTRAMUSCULAR | Status: DC | PRN
  Administered 2016-07-25: 80 ug via INTRAVENOUS
  Administered 2016-07-25 (×2): 120 ug via INTRAVENOUS
  Administered 2016-07-25 (×2): 80 ug via INTRAVENOUS

## 2016-07-25 MED ORDER — OXYCODONE-ACETAMINOPHEN 5-325 MG PO TABS: 1.0000 | ORAL_TABLET | ORAL | Status: DC | PRN

## 2016-07-25 MED ORDER — 0.9 % SODIUM CHLORIDE (POUR BTL) OPTIME
TOPICAL | Status: DC | PRN
  Administered 2016-07-25: 1000 mL

## 2016-07-25 MED ORDER — CEFAZOLIN SODIUM-DEXTROSE 2-4 GM/100ML-% IV SOLN
INTRAVENOUS | Status: DC
Start: 2016-07-25 — End: 2016-07-25
  Filled 2016-07-25: qty 100

## 2016-07-25 MED ORDER — METOCLOPRAMIDE HCL 5 MG/ML IJ SOLN
5.0000 mg | Freq: Three times a day (TID) | INTRAMUSCULAR | Status: DC | PRN
  Filled 2016-07-25: qty 2

## 2016-07-25 MED ORDER — OXYCODONE HCL ER 10 MG PO T12A: 10.0000 mg | EXTENDED_RELEASE_TABLET | Freq: Two times a day (BID) | ORAL | Status: DC

## 2016-07-25 MED ORDER — PIPERACILLIN-TAZOBACTAM 3.375 G IVPB
3.3750 g | Freq: Three times a day (TID) | INTRAVENOUS | Status: DC
  Administered 2016-07-26 – 2016-07-27 (×5): 3.375 g via INTRAVENOUS
  Filled 2016-07-25 (×7): qty 50

## 2016-07-25 MED ORDER — MENTHOL 3 MG MT LOZG: 1.0000 | LOZENGE | OROMUCOSAL | Status: DC | PRN

## 2016-07-25 MED ORDER — SODIUM CHLORIDE 0.9 % IV SOLN
INTRAVENOUS | Status: DC
  Administered 2016-07-25 – 2016-07-29 (×6): via INTRAVENOUS

## 2016-07-25 MED ORDER — CEFAZOLIN SODIUM-DEXTROSE 2-4 GM/100ML-% IV SOLN
2.0000 g | Freq: Four times a day (QID) | INTRAVENOUS | Status: AC
  Administered 2016-07-25 – 2016-07-26 (×2): 2 g via INTRAVENOUS
  Filled 2016-07-25 (×2): qty 100

## 2016-07-25 MED ORDER — OXYCODONE HCL 5 MG PO TABS
ORAL_TABLET | ORAL | Status: AC
  Filled 2016-07-25: qty 2

## 2016-07-25 MED ORDER — FENTANYL CITRATE (PF) 100 MCG/2ML IJ SOLN
INTRAMUSCULAR | Status: DC | PRN
  Administered 2016-07-25: 100 ug via INTRAVENOUS
  Administered 2016-07-25 (×2): 50 ug via INTRAVENOUS

## 2016-07-25 MED ORDER — SODIUM CHLORIDE 0.9 % IR SOLN
Status: DC | PRN
  Administered 2016-07-25: 6000 mL
  Administered 2016-07-25: 3000 mL

## 2016-07-25 MED ORDER — OXYCODONE HCL 5 MG PO TABS
ORAL_TABLET | ORAL | Status: AC
Start: 2016-07-25 — End: 2016-07-25
  Administered 2016-07-25: 10 mg via ORAL
  Filled 2016-07-25: qty 2

## 2016-07-25 MED ORDER — PROMETHAZINE HCL 25 MG PO TABS: 25.0000 mg | ORAL_TABLET | Freq: Four times a day (QID) | ORAL | Status: DC | PRN

## 2016-07-25 MED ORDER — AMITRIPTYLINE HCL 50 MG PO TABS
75.0000 mg | ORAL_TABLET | Freq: Two times a day (BID) | ORAL | Status: DC
  Administered 2016-07-25 – 2016-08-05 (×20): 75 mg via ORAL
  Filled 2016-07-25 (×6): qty 1
  Filled 2016-07-25: qty 2
  Filled 2016-07-25 (×3): qty 1
  Filled 2016-07-25: qty 2
  Filled 2016-07-25 (×2): qty 1
  Filled 2016-07-25: qty 2
  Filled 2016-07-25 (×2): qty 1
  Filled 2016-07-25: qty 2
  Filled 2016-07-25 (×6): qty 1

## 2016-07-25 MED ORDER — TRANEXAMIC ACID 1000 MG/10ML IV SOLN
2000.0000 mg | Freq: Once | INTRAVENOUS | Status: DC
  Filled 2016-07-25: qty 20

## 2016-07-25 MED ORDER — VANCOMYCIN HCL 1000 MG IV SOLR
INTRAVENOUS | Status: AC
  Filled 2016-07-25: qty 2000

## 2016-07-25 MED ORDER — VANCOMYCIN HCL 1000 MG IV SOLR
INTRAVENOUS | Status: DC | PRN
  Administered 2016-07-25: 1000 mg via TOPICAL

## 2016-07-25 MED ORDER — VANCOMYCIN HCL IN DEXTROSE 1-5 GM/200ML-% IV SOLN
1000.0000 mg | INTRAVENOUS | Status: AC
  Administered 2016-07-25: 1000 mg via INTRAVENOUS
  Filled 2016-07-25: qty 200

## 2016-07-25 MED ORDER — MIDAZOLAM HCL 5 MG/5ML IJ SOLN
INTRAMUSCULAR | Status: DC | PRN
  Administered 2016-07-25: 2 mg via INTRAVENOUS

## 2016-07-25 MED ORDER — ALUM & MAG HYDROXIDE-SIMETH 200-200-20 MG/5ML PO SUSP
30.0000 mL | ORAL | Status: DC | PRN
  Administered 2016-08-04: 30 mL via ORAL
  Filled 2016-07-25: qty 30

## 2016-07-25 MED ORDER — OXYCODONE HCL 5 MG PO TABS: 5.0000 mg | ORAL_TABLET | Freq: Once | ORAL | Status: DC | PRN

## 2016-07-25 MED ORDER — DIPHENHYDRAMINE HCL 12.5 MG/5ML PO ELIX
25.0000 mg | ORAL_SOLUTION | ORAL | Status: DC | PRN
  Administered 2016-07-31 – 2016-08-04 (×7): 25 mg via ORAL
  Filled 2016-07-25 (×7): qty 10

## 2016-07-25 MED ORDER — FENTANYL CITRATE (PF) 100 MCG/2ML IJ SOLN
INTRAMUSCULAR | Status: AC
  Filled 2016-07-25: qty 4

## 2016-07-25 MED ORDER — SORBITOL 70 % SOLN
30.0000 mL | Freq: Every day | Status: DC | PRN
  Administered 2016-08-02: 30 mL via ORAL
  Filled 2016-07-25 (×2): qty 30

## 2016-07-25 MED ORDER — MORPHINE SULFATE (PF) 2 MG/ML IV SOLN
1.0000 mg | INTRAVENOUS | Status: DC | PRN
  Filled 2016-07-25: qty 1

## 2016-07-25 MED ORDER — ONDANSETRON HCL 4 MG/2ML IJ SOLN
INTRAMUSCULAR | Status: AC
  Filled 2016-07-25: qty 2

## 2016-07-25 MED ORDER — OXYCODONE HCL 5 MG PO TABS
5.0000 mg | ORAL_TABLET | ORAL | Status: DC | PRN
  Administered 2016-07-25 – 2016-07-28 (×2): 10 mg via ORAL
  Administered 2016-07-28: 15 mg via ORAL
  Administered 2016-07-29 – 2016-08-01 (×3): 10 mg via ORAL
  Filled 2016-07-25: qty 3
  Filled 2016-07-25 (×4): qty 2

## 2016-07-25 MED ORDER — FENTANYL CITRATE (PF) 100 MCG/2ML IJ SOLN
25.0000 ug | INTRAMUSCULAR | Status: DC | PRN
  Administered 2016-07-25: 50 ug via INTRAVENOUS

## 2016-07-25 MED ORDER — ASPIRIN EC 325 MG PO TBEC
325.0000 mg | DELAYED_RELEASE_TABLET | Freq: Two times a day (BID) | ORAL | Status: DC
  Administered 2016-07-26 – 2016-08-05 (×22): 325 mg via ORAL
  Filled 2016-07-25 (×22): qty 1

## 2016-07-25 MED ORDER — METHOCARBAMOL 1000 MG/10ML IJ SOLN
500.0000 mg | Freq: Four times a day (QID) | INTRAVENOUS | Status: DC | PRN
  Filled 2016-07-25: qty 5

## 2016-07-25 MED ORDER — METHOCARBAMOL 750 MG PO TABS: 750.0000 mg | ORAL_TABLET | Freq: Two times a day (BID) | ORAL | Status: DC | PRN

## 2016-07-25 MED ORDER — OXYCODONE HCL 5 MG/5ML PO SOLN: 5.0000 mg | Freq: Once | ORAL | Status: DC | PRN

## 2016-07-25 MED ORDER — METOPROLOL TARTRATE 25 MG PO TABS
25.0000 mg | ORAL_TABLET | Freq: Once | ORAL | Status: AC
  Administered 2016-07-25: 25 mg via ORAL
  Filled 2016-07-25: qty 1

## 2016-07-25 MED ORDER — METHOCARBAMOL 500 MG PO TABS
ORAL_TABLET | ORAL | Status: AC
  Administered 2016-07-25: 500 mg via ORAL
  Filled 2016-07-25: qty 1

## 2016-07-25 MED ORDER — PANTOPRAZOLE SODIUM 40 MG PO TBEC
40.0000 mg | DELAYED_RELEASE_TABLET | Freq: Every day | ORAL | Status: DC
  Administered 2016-07-25 – 2016-08-05 (×12): 40 mg via ORAL
  Filled 2016-07-25 (×12): qty 1

## 2016-07-25 MED ORDER — DEXAMETHASONE SODIUM PHOSPHATE 10 MG/ML IJ SOLN
10.0000 mg | Freq: Once | INTRAMUSCULAR | Status: AC
  Administered 2016-07-26: 10 mg via INTRAVENOUS
  Filled 2016-07-25: qty 1

## 2016-07-25 MED ORDER — MAGNESIUM CITRATE PO SOLN: 1.0000 | Freq: Once | ORAL | Status: DC | PRN

## 2016-07-25 MED ORDER — PHENOL 1.4 % MT LIQD: 1.0000 | OROMUCOSAL | Status: DC | PRN

## 2016-07-25 MED ORDER — DEXAMETHASONE SODIUM PHOSPHATE 10 MG/ML IJ SOLN
INTRAMUSCULAR | Status: DC | PRN
  Administered 2016-07-25: 10 mg via INTRAVENOUS

## 2016-07-25 MED ORDER — KETOROLAC TROMETHAMINE 30 MG/ML IJ SOLN
30.0000 mg | Freq: Four times a day (QID) | INTRAMUSCULAR | Status: AC
  Administered 2016-07-26 – 2016-07-27 (×5): 30 mg via INTRAVENOUS
  Filled 2016-07-25 (×6): qty 1

## 2016-07-25 MED ORDER — TRANEXAMIC ACID 1000 MG/10ML IV SOLN
INTRAVENOUS | Status: DC | PRN
  Administered 2016-07-25: 2000 mg via TOPICAL

## 2016-07-25 MED ORDER — ONDANSETRON HCL 4 MG/2ML IJ SOLN
INTRAMUSCULAR | Status: DC | PRN
  Administered 2016-07-25: 4 mg via INTRAVENOUS

## 2016-07-25 MED ORDER — LACTATED RINGERS IV SOLN
INTRAVENOUS | Status: DC | PRN
  Administered 2016-07-25: 19:00:00 via INTRAVENOUS

## 2016-07-25 MED ORDER — MIDAZOLAM HCL 2 MG/2ML IJ SOLN
INTRAMUSCULAR | Status: AC
  Filled 2016-07-25: qty 2

## 2016-07-25 SURGICAL SUPPLY — 72 items
BAG DECANTER FOR FLEXI CONT (MISCELLANEOUS) ×3 IMPLANT
CELLS DAT CNTRL 66122 CELL SVR (MISCELLANEOUS) ×1 IMPLANT
COVER SURGICAL LIGHT HANDLE (MISCELLANEOUS) ×3 IMPLANT
DRAPE C-ARM 42X72 X-RAY (DRAPES) ×3 IMPLANT
DRAPE STERI IOBAN 125X83 (DRAPES) ×3 IMPLANT
DRAPE U-SHAPE 47X51 STRL (DRAPES) ×6 IMPLANT
DRSG AQUACEL AG ADV 3.5X10 (GAUZE/BANDAGES/DRESSINGS) ×3 IMPLANT
DRSG AQUACEL AG ADV 3.5X14 (GAUZE/BANDAGES/DRESSINGS) ×2 IMPLANT
DURAPREP 26ML APPLICATOR (WOUND CARE) ×3 IMPLANT
ELECT BLADE 4.0 EZ CLEAN MEGAD (MISCELLANEOUS) ×6
ELECT REM PT RETURN 9FT ADLT (ELECTROSURGICAL) ×3
ELECTRODE BLDE 4.0 EZ CLN MEGD (MISCELLANEOUS) ×1 IMPLANT
ELECTRODE REM PT RTRN 9FT ADLT (ELECTROSURGICAL) ×1 IMPLANT
GLOVE BIO SURGEON STRL SZ 6.5 (GLOVE) ×1 IMPLANT
GLOVE BIO SURGEON STRL SZ7.5 (GLOVE) ×2 IMPLANT
GLOVE BIO SURGEONS STRL SZ 6.5 (GLOVE) ×1
GLOVE BIOGEL PI IND STRL 6.5 (GLOVE) IMPLANT
GLOVE BIOGEL PI IND STRL 8 (GLOVE) IMPLANT
GLOVE BIOGEL PI INDICATOR 6.5 (GLOVE) ×4
GLOVE BIOGEL PI INDICATOR 8 (GLOVE) ×2
GLOVE SKINSENSE NS SZ7.5 (GLOVE) ×2
GLOVE SKINSENSE STRL SZ7.5 (GLOVE) ×1 IMPLANT
GLOVE SURG ORTHO 7.0 STRL STRW (GLOVE) ×2 IMPLANT
GLOVE SURG SS PI 6.5 STRL IVOR (GLOVE) ×2 IMPLANT
GLOVE SURG SYN 7.5  E (GLOVE) ×6
GLOVE SURG SYN 7.5 E (GLOVE) ×3 IMPLANT
GLOVE SURG SYN 7.5 PF PI (GLOVE) ×2 IMPLANT
GOWN SRG XL XLNG 56XLVL 4 (GOWN DISPOSABLE) ×1 IMPLANT
GOWN STRL NON-REIN XL XLG LVL4 (GOWN DISPOSABLE) ×3
GOWN STRL REUS W/ TWL LRG LVL3 (GOWN DISPOSABLE) IMPLANT
GOWN STRL REUS W/ TWL XL LVL3 (GOWN DISPOSABLE) IMPLANT
GOWN STRL REUS W/TWL 2XL LVL3 (GOWN DISPOSABLE) ×2 IMPLANT
GOWN STRL REUS W/TWL LRG LVL3 (GOWN DISPOSABLE) ×9
GOWN STRL REUS W/TWL XL LVL3 (GOWN DISPOSABLE) ×3
HANDPIECE INTERPULSE COAX TIP (DISPOSABLE) ×3
HEAD CERAMIC DELTA 36 PLUS 1.5 (Hips) ×2 IMPLANT
HOOD PEEL AWAY FLYTE STAYCOOL (MISCELLANEOUS) ×8 IMPLANT
IV NS 1000ML (IV SOLUTION) ×3
IV NS 1000ML BAXH (IV SOLUTION) ×1 IMPLANT
IV NS IRRIG 3000ML ARTHROMATIC (IV SOLUTION) ×7 IMPLANT
KIT BASIN OR (CUSTOM PROCEDURE TRAY) ×3 IMPLANT
KIT STIMULAN RAPID CURE  10CC (Orthopedic Implant) ×2 IMPLANT
KIT STIMULAN RAPID CURE 10CC (Orthopedic Implant) IMPLANT
LINER NEUTRAL 52X36MM PLUS 4 (Liner) ×2 IMPLANT
MARKER SKIN DUAL TIP RULER LAB (MISCELLANEOUS) ×3 IMPLANT
NDL SPNL 18GX3.5 QUINCKE PK (NEEDLE) ×1 IMPLANT
NEEDLE SPNL 18GX3.5 QUINCKE PK (NEEDLE) ×3 IMPLANT
PACK TOTAL JOINT (CUSTOM PROCEDURE TRAY) ×3 IMPLANT
PACK UNIVERSAL I (CUSTOM PROCEDURE TRAY) ×3 IMPLANT
PENCIL BUTTON HOLSTER BLD 10FT (ELECTRODE) ×2 IMPLANT
RETRACTOR WND ALEXIS 18 MED (MISCELLANEOUS) ×1 IMPLANT
RTRCTR WOUND ALEXIS 18CM MED (MISCELLANEOUS) ×3
SAW OSC TIP CART 19.5X105X1.3 (SAW) ×3 IMPLANT
SEALER BIPOLAR AQUA 6.0 (INSTRUMENTS) ×3 IMPLANT
SET HNDPC FAN SPRY TIP SCT (DISPOSABLE) ×1 IMPLANT
STAPLER VISISTAT 35W (STAPLE) ×2 IMPLANT
SUT ETHIBOND 2 V 37 (SUTURE) ×3 IMPLANT
SUT ETHILON 2 0 PSLX (SUTURE) ×2 IMPLANT
SUT MON AB 2-0 CT1 36 (SUTURE) ×2 IMPLANT
SUT PDS AB 0 CT 36 (SUTURE) ×2 IMPLANT
SUT PDS AB 1 CTX 36 (SUTURE) ×2 IMPLANT
SUT VIC AB 1 CT1 27 (SUTURE) ×3
SUT VIC AB 1 CT1 27XBRD ANBCTR (SUTURE) ×1 IMPLANT
SUT VIC AB 2-0 CT1 27 (SUTURE) ×3
SUT VIC AB 2-0 CT1 TAPERPNT 27 (SUTURE) ×1 IMPLANT
SWAB COLLECTION DEVICE MRSA (MISCELLANEOUS) IMPLANT
SWAB CULTURE ESWAB REG 1ML (MISCELLANEOUS) IMPLANT
SYR 20CC LL (SYRINGE) ×3 IMPLANT
SYR 50ML LL SCALE MARK (SYRINGE) ×3 IMPLANT
TOWEL OR 17X26 10 PK STRL BLUE (TOWEL DISPOSABLE) ×3 IMPLANT
TRAY CATH 16FR W/PLASTIC CATH (SET/KITS/TRAYS/PACK) ×3 IMPLANT
YANKAUER SUCT BULB TIP NO VENT (SUCTIONS) ×3 IMPLANT

## 2016-07-25 NOTE — Progress Notes (Signed)
ANTIBIOTIC CONSULT NOTE - INITIAL  Pharmacy Consult for Vanco/Zosyn Indication: wound infection  Allergies  Allergen Reactions  . Fluconazole Swelling    REACTION: Face swelling  . Robitussin (Alcohol Free) [Guaifenesin] Palpitations    Chest pain    Patient Measurements: Height: 5\' 2"  (157.5 cm) Weight: 250 lb (113.4 kg) IBW/kg (Calculated) : 50.1 Adjusted Body Weight:    Vital Signs: Temp: 97.6 F (36.4 C) (01/22 2216) Temp Source: Oral (01/22 2216) BP: 109/85 (01/22 2216) Pulse Rate: 107 (01/22 2216) Intake/Output from previous day: No intake/output data recorded. Intake/Output from this shift: Total I/O In: 3170 [I.V.:2500; Blood:670] Out: 700 [Urine:500; Blood:200]  Labs:  Recent Labs  07/25/16 1506  WBC 17.3*  HGB 9.2*  PLT 336  CREATININE 0.95   Estimated Creatinine Clearance: 75.9 mL/min (by C-G formula based on SCr of 0.95 mg/dL). No results for input(s): VANCOTROUGH, VANCOPEAK, VANCORANDOM, GENTTROUGH, GENTPEAK, GENTRANDOM, TOBRATROUGH, TOBRAPEAK, TOBRARND, AMIKACINPEAK, AMIKACINTROU, AMIKACIN in the last 72 hours.   Microbiology: Recent Results (from the past 720 hour(s))  Aerobic/Anaerobic Culture (surgical/deep wound)     Status: None (Preliminary result)   Collection Time: 07/25/16  6:27 PM  Result Value Ref Range Status   Specimen Description WOUND LEFT HIP  Final   Special Requests SYNOVIAL  Final   Gram Stain   Final    ABUNDANT WBC PRESENT,BOTH PMN AND MONONUCLEAR MODERATE GRAM POSITIVE COCCI IN CLUSTERS    Culture PENDING  Incomplete   Report Status PENDING  Incomplete    Medical History: Past Medical History:  Diagnosis Date  . Allergy   . Anemia   . Anxiety   . Arthritis   . Back pain, chronic   . Bronchitis   . Cough   . Depression   . GERD (gastroesophageal reflux disease)   . Hyperlipidemia   . Hypertension   . Obesity   . 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 Seizures ([place])    last >10 yrs ago  .  Somatization disorder   . Tobacco abuse     Assessment: 61 y/o F presents with L hip pain and swelling. S/p THR 06/01/16.  1/22: I&D, L THA revision and complex wound repair  Afebrile. WBC 17.3 up from 07/19/16.. Scr 0.95 (estimated CrCl 70-80) CRP 258.5 elevated, sed reate 71 elevated  Goal of Therapy:  Vancomycin trough level 15-20 mcg/ml  Plan:  Zosyn 3.375g IV q8hr. Vancomycin 1g IV in OR, then 750mg  IV q12h. Vanco trough after 3-5 doses at steady state.   Lilyth Lawyer S. Alford Highland, PharmD, BCPS Clinical Staff Pharmacist Pager [phone]  Eilene Ghazi Stillinger 07/25/2016,10:50 PM

## 2016-07-25 NOTE — H&P (Signed)
ORTHOPAEDIC HISTORY AND PHYSICAL   Chief Complaint: Left Prosthetic hip infection  HPI: Kristin [name] is a 61 y.o. female who comes in today for treatment of acute left prosthetic hip infection for 1 week.  Past Medical History:  Diagnosis Date  . Allergy   . Anemia   . Anxiety   . Arthritis   . Back pain, chronic   . Bronchitis   . Cough   . Depression   . GERD (gastroesophageal reflux disease)   . Hyperlipidemia   . Hypertension   . Obesity   . 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 Seizures ([place])    last >10 yrs ago  . Somatization disorder   . Tobacco abuse    Past Surgical History:  Procedure Laterality Date  . ABDOMINAL HYSTERECTOMY    . CHOLECYSTECTOMY    . kidney stent Right 2002  . TONSILLECTOMY Bilateral 12/06/2014   Procedure: TONSILLECTOMY, BILATERAL;  Surgeon: Ruby Cola, MD;  Location: [facility];  Service: ENT;  Laterality: Bilateral;  . TONSILLECTOMY    . TOTAL HIP ARTHROPLASTY Left 06/01/2016   Procedure: LEFT TOTAL HIP ARTHROPLASTY ANTERIOR APPROACH;  Surgeon: Leandrew Koyanagi, MD;  Location: [facility];  Service: Orthopedics;  Laterality: Left;  . TUBAL LIGATION     Social History   Social History  . Marital status: Married    Spouse name: N/A  . Number of children: N/A  . Years of education: N/A   Social History Main Topics  . Smoking status: Former Smoker    Packs/day: 0.25    Years: 0.50    Types: Cigarettes    Quit date: 01/27/2014  . Smokeless tobacco: Never Used  . Alcohol use No  . Drug use: No  . Sexual activity: Not Asked   Other Topics Concern  . None   Social History Narrative  . None   Family History  Problem Relation Age of Onset  . Other Mother     failed surgery  . Heart Problems Father     poss MI, not sure:per pt  . Alzheimer's disease Maternal Grandmother   . Heart disease Maternal Grandfather   . Colon cancer Maternal Uncle     not sure age of onset   Allergies  Allergen Reactions  .  Fluconazole Swelling    REACTION: Face swelling  . Robitussin (Alcohol Free) [Guaifenesin] Palpitations    Chest pain   Prior to Admission medications   Medication Sig Start Date End Date Taking? Authorizing Provider  acetaminophen (TYLENOL) 500 MG tablet Take 500-1,000 mg by mouth every 6 (six) hours as needed for headache (or pain).   Yes Historical Provider, MD  amitriptyline (ELAVIL) 75 MG tablet Take 75 mg by mouth 2 (two) times daily.   Yes Historical Provider, MD  cyclobenzaprine (FLEXERIL) 10 MG tablet Take 1 tablet (10 mg total) by mouth 3 (three) times daily as needed for muscle spasms. 01/07/16  Yes Smiley Houseman, MD  hydrochlorothiazide (HYDRODIURIL) 25 MG tablet Take 1 tablet (25 mg total) by mouth daily. 04/07/16  Yes Elizabeth Woodland Mumaw, DO  lovastatin (MEVACOR) 40 MG tablet Take 1 tablet (40 mg total) by mouth at bedtime. 01/28/16  Yes Smiley Houseman, MD  metoprolol tartrate (LOPRESSOR) 25 MG tablet TAKE ONE TABLET BY MOUTH TWICE DAILY 03/30/16  Yes Smiley Houseman, MD  omeprazole (PRILOSEC) 20 MG capsule Take 1 capsule (20 mg total) by mouth daily. Patient taking differently: Take 20  mg by mouth every evening.  01/07/16  Yes Smiley Houseman, MD  oxyCODONE-acetaminophen (PERCOCET/ROXICET) 5-325 MG tablet Take 1-2 tablets by mouth every 4 (four) hours as needed for severe pain. 07/19/16  Yes Meredith Pel, MD  aspirin EC 325 MG tablet Take 1 tablet (325 mg total) by mouth 2 (two) times daily. Patient not taking: Reported on 07/25/2016 06/01/16   Leandrew Koyanagi, MD  desonide (DESOWEN) 0.05 % ointment Apply 1 application topically 2 (two) times daily. Patient not taking: Reported on 07/25/2016 07/05/16   Leandrew Koyanagi, MD  methocarbamol (ROBAXIN) 750 MG tablet Take 1 tablet (750 mg total) by mouth 2 (two) times daily as needed for muscle spasms. Patient not taking: Reported on 07/25/2016 06/01/16   Leandrew Koyanagi, MD  naproxen (NAPROSYN) 500 MG tablet TAKE ONE TABLET BY  MOUTH TWICE DAILY BETWEEN  MEALS  AS  NEEDED  FOR  MILD  PAIN,  MODERATE  PAIN OR  HEADACHE. Take with food Patient not taking: Reported on 07/25/2016 05/20/16   Smiley Houseman, MD  omeprazole (PRILOSEC) 20 MG capsule TAKE ONE CAPSULE BY MOUTH ONCE DAILY AS NEEDED Patient not taking: Reported on 07/25/2016 06/01/16   Smiley Houseman, MD  ondansetron (ZOFRAN) 4 MG tablet Take 1-2 tablets (4-8 mg total) by mouth every 8 (eight) hours as needed for nausea or vomiting. Patient not taking: Reported on 07/25/2016 06/01/16   Leandrew Koyanagi, MD  oxyCODONE (OXYCONTIN) 10 mg 12 hr tablet Take 1 tablet (10 mg total) by mouth every 12 (twelve) hours. Patient not taking: Reported on 07/25/2016 06/01/16   Leandrew Koyanagi, MD  oxyCODONE-acetaminophen (PERCOCET) 5-325 MG tablet Take 1-2 tablets by mouth every 4 (four) hours as needed for severe pain. Patient not taking: Reported on 07/25/2016 06/01/16   Leandrew Koyanagi, MD  promethazine (PHENERGAN) 25 MG tablet Take 1 tablet (25 mg total) by mouth every 6 (six) hours as needed for nausea. Patient not taking: Reported on 07/25/2016 06/01/16   Leandrew Koyanagi, MD   Dg Pelvis Portable  Result Date: 07/25/2016 CLINICAL DATA:  Status post irrigation debridement and left hip revision EXAM: PORTABLE PELVIS 1-2 VIEWS COMPARISON:  07/05/2016 FINDINGS: Left hip prosthesis is again noted. Multiple radiopaque densities are noted about the hip joint consistent with the recent surgical revision. No acute abnormality is noted. IMPRESSION: Status post left hip revision Electronically Signed   By: Inez Catalina M.D.   On: 07/25/2016 21:47   - pertinent xrays, CT, MRI studies were reviewed and independently interpreted  Positive ROS: All other systems have been reviewed and were otherwise negative with the exception of those mentioned in the HPI and as above.  Physical Exam: General: Alert, no acute distress Cardiovascular: No pedal edema Respiratory: No cyanosis, no use of accessory  musculature GI: No organomegaly, abdomen is soft and non-tender Skin: No lesions in the area of chief complaint Neurologic: Sensation intact distally Psychiatric: Patient is competent for consent with normal mood and affect Lymphatic: No axillary or cervical lymphadenopathy  MUSCULOSKELETAL:  - Left thigh induration with pain to palpation and range of motion  Assessment: Left hip prosthetic infection  Plan: To OR for irrigation and debridement and hip revision and admission postop for IV antibiotics and infectious disease consult   N. Eduard Roux, MD [place] 10:29 PM

## 2016-07-25 NOTE — Anesthesia Preprocedure Evaluation (Signed)
Anesthesia Evaluation  Patient identified by MRN, date of birth, ID band Patient awake    Reviewed: Allergy & Precautions, NPO status , Patient's Chart, lab work & pertinent test results  History of Anesthesia Complications Negative for: history of anesthetic complications  Airway Mallampati: II  TM Distance: >3 FB Neck ROM: Full    Dental  (+) Poor Dentition, Missing   Pulmonary former smoker,    breath sounds clear to auscultation       Cardiovascular hypertension, Pt. on medications and Pt. on home beta blockers (-) CHF  Rhythm:Regular     Neuro/Psych  Headaches, Seizures -,  PSYCHIATRIC DISORDERS Anxiety Depression    GI/Hepatic PUD, GERD  Medicated,  Endo/Other  diabetes  Renal/GU Renal InsufficiencyRenal disease     Musculoskeletal  (+) Arthritis ,   Abdominal   Peds  Hematology  (+) anemia ,   Anesthesia Other Findings Broken and decayed right incisor.  Missing upper front right tooth.  Painful decayed tooth upper right near back.  Reproductive/Obstetrics                             Anesthesia Physical Anesthesia Plan  ASA: III  Anesthesia Plan: General   Post-op Pain Management:    Induction: Intravenous  Airway Management Planned: Oral ETT  Additional Equipment: None  Intra-op Plan:   Post-operative Plan: Extubation in OR  Informed Consent: I have reviewed the patients History and Physical, chart, labs and discussed the procedure including the risks, benefits and alternatives for the proposed anesthesia with the patient or authorized representative who has indicated his/her understanding and acceptance.   Dental advisory given  Plan Discussed with: CRNA and Surgeon  Anesthesia Plan Comments:         Anesthesia Quick Evaluation

## 2016-07-25 NOTE — Transfer of Care (Signed)
Immediate Anesthesia Transfer of Care Note  Patient: Kristin [name]  Procedure(s) Performed: Procedure(s): LEFT HIP IRRIGATION AND DEBRIDEMENT, REVISION OF HEAD AND LINER (Left)  Patient Location: PACU  Anesthesia Type:General  Level of Consciousness: awake, sedated and patient cooperative  Airway & Oxygen Therapy: Patient connected to nasal cannula oxygen  Post-op Assessment: Report given to RN and Post -op Vital signs reviewed and stable  Post vital signs: Reviewed and stable  Last Vitals:  Vitals:   07/25/16 1450  BP: 139/74  Pulse: (!) 115  Resp: 20  Temp: 37.1 C    Last Pain:  Vitals:   07/25/16 1557  TempSrc:   PainSc: 7       Patients Stated Pain Goal: 3 ([id number])  Complications: No apparent anesthesia complications

## 2016-07-25 NOTE — Progress Notes (Signed)
Kristin [name] - 61 y.o. female MRN [id number]  Date of birth: 30-Apr-1956  Office Visit Note: Visit Date: 07/25/2016 PCP: Kristin Houseman, MD Referred by: Kristin Houseman, MD  Subjective: Chief Complaint  Kristin [name] presents with  . Left Hip - Pain   HPI: Mrs. Kristin [name] is a 61 year old female Kristin [name] here today for left hip pain. Pain and swelling since last Monday.Had hip replacement on 06/01/16. States she was doing well with surgery until then. Constant pain. Worse with weight bearing. She saw Dr. Marlou [name] who work labs with high CRP and sedimentation rate. She saw Dr. Erlinda [name] today requested hip aspiration.    ROS Otherwise per HPI.  Assessment & Plan: Visit Diagnoses:  1. Pain in left hip     Plan: Findings:  Left hip aspiration. This yielded 27 mL of fluid containing gross pus.    Meds & Orders: No orders of the defined types were placed in this encounter.   Orders Placed This Encounter  Procedures  . Large Joint Injection/Arthrocentesis  . Body fluid culture  . Cell count + diff,  w/ cryst-synvl fld    Follow-up: Return for scheduled follow up with Dr. Erlinda [name].   Procedures: Large Joint Inj Date/Time: 07/25/2016 9:41 AM Performed by: Kristin [name] Authorized by: Kristin [name]   Consent Given by:  Kristin [name] Site marked: the procedure site was marked   Timeout: prior to procedure the correct Kristin [name], procedure, and site was verified   Indications:  Pain, diagnostic evaluation and joint swelling Location:  Hip Site:  L hip joint Prep: Kristin [name] was prepped and draped in usual sterile fashion   Needle gauge: 20 G. Needle Length:  3.5 inches Approach:  Anterior Ultrasound Guidance: No   Fluoroscopic Guidance: Yes   Arthrogram: No   Aspiration Attempted: Yes   Aspirate amount (mL):  27 Aspirate:  Cloudy, purulent and yellow Lab: fluid sent for laboratory analysis   Kristin [name] tolerance:  Kristin [name] tolerated the procedure well with no immediate complications    No  notes on file   Clinical History: No specialty comments available.  She reports that she quit smoking about 2 years ago. Her smoking use included Cigarettes. She has a 0.12 pack-year smoking history. She has never used smokeless tobacco.   Recent Labs  01/07/16 1556 05/03/16 1540  HGBA1C 6.4 6.5    Objective:  VS:  HT:    WT:   BMI:     BP:133/75  HR: bpm  TEMP: ( )  RESP:  Physical Exam  Musculoskeletal:  Kristin [name] in wheelchair with well-healed surgical scar. No drainage from the surgical site.    Ortho Exam Imaging: No results found.  Past Medical/Family/Surgical/Social History: Medications & Allergies reviewed per EMR Kristin [name] Active Problem List   Diagnosis Date Noted  . 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 Sinus tachycardia 05/19/2016  . History of adenomatous polyp of colon 03/21/2016  . Diabetes ([place]) 01/21/2016  . Thyroid nodule 12/25/2014  . ARDS (adult respiratory distress syndrome) ([place])   . Infiltrate noted on imaging study   . Acute respiratory failure with hypoxia ([place])   . Pharyngeal swelling   . Tonsillar abscess 12/06/2014  . Tonsil, abscess 12/06/2014  . 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 Primary osteoarthritis of left hip 11/14/2014  . Loss of consciousness ([place]) 11/14/2014  . Cough 05/26/2014  . Musculoskeletal chest and rib pain 03/26/2014  . Rib pain on right side 02/28/2014  . Groin pain 04/18/2013  . Tension headache 12/22/2012  .  UNSPECIFIED RENAL SCLEROSIS 01/16/2009  . GERD, SEVERE 05/25/2007  . OBESITY NOS 04/03/2007  . HYPERLIPIDEMIA 08/31/2006  . Major depression, recurrent ([place]) 08/31/2006  . 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 Former smoker 08/31/2006  . HYPERTENSION, BENIGN SYSTEMIC 08/31/2006  . GASTRIC ULCER ACUTE WITHOUT HEMORRHAGE 08/31/2006  . CONVULSIONS, SEIZURES, NOS 08/31/2006   Past Medical History:  Diagnosis Date  . Allergy   . Anemia   . Anxiety   . Arthritis   . Back pain, chronic   .  Bronchitis   . Cough   . Depression   . GERD (gastroesophageal reflux disease)   . Hyperlipidemia   . Hypertension   . Obesity   . 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 Seizures ([place])    last >10 yrs ago  . Somatization disorder   . Tobacco abuse    Family History  Problem Relation Age of Onset  . Other Mother     failed surgery  . Heart Problems Father     poss MI, not sure:per pt  . Alzheimer's disease Maternal Grandmother   . Heart disease Maternal Grandfather   . Colon cancer Maternal Uncle     not sure age of onset   Past Surgical History:  Procedure Laterality Date  . ABDOMINAL HYSTERECTOMY    . CHOLECYSTECTOMY    . kidney stent Right 2002  . TONSILLECTOMY Bilateral 12/06/2014   Procedure: TONSILLECTOMY, BILATERAL;  Surgeon: Ruby Cola, MD;  Location: [facility];  Service: ENT;  Laterality: Bilateral;  . TONSILLECTOMY    . TOTAL HIP ARTHROPLASTY Left 06/01/2016   Procedure: LEFT TOTAL HIP ARTHROPLASTY ANTERIOR APPROACH;  Surgeon: Leandrew Koyanagi, MD;  Location: [facility];  Service: Orthopedics;  Laterality: Left;  . TUBAL LIGATION     Social History   Occupational History  . Not on file.   Social History Main Topics  . Smoking status: Former Smoker    Packs/day: 0.25    Years: 0.50    Types: Cigarettes    Quit date: 01/27/2014  . Smokeless tobacco: Never Used  . Alcohol use No  . Drug use: No  . Sexual activity: Not on file

## 2016-07-25 NOTE — Op Note (Signed)
   Date of Surgery: 07/25/2016  INDICATIONS: Kristin [name] is a 61 y.o.-year-old female with an acute left total hip infection.  The patient presented to my office about a week ago with acute onset of left hip pain to the point where she was not able to ablate. Prior to that she had an uneventful 6 week postoperative course.  The patient did consent to the procedure after discussion of the risks and benefits.  PREOPERATIVE DIAGNOSIS: Acute left total hip periprosthetic infection  POSTOPERATIVE DIAGNOSIS: Same.  PROCEDURE: 1. Irrigation and debridement of skin, muscle, fascia 10 x 10 cm 2. Left total hip revision with exchange of left femoral head and acetabular polyethylene liner 3. Complex wound repair of left hip 10 cm  SURGEON: N. Eduard Roux, M.D.  ASSIST: Ky Barban, RNFA.  ANESTHESIA:  general  IV FLUIDS AND URINE: See anesthesia.  ESTIMATED BLOOD LOSS: See anesthesia record.  IMPLANTS: Depuy  DRAINS: None  COMPLICATIONS: None.  DESCRIPTION OF PROCEDURE: The patient was brought to the operating room and placed supine on the [facility] operating table.  The patient had been signed prior to the procedure and this was documented. The patient had the anesthesia placed by the anesthesiologist.  A time-out was performed to confirm that this was the correct patient, site, side and location. The patient did receive antibiotics prior to the incision and was re-dosed during the procedure as needed at indicated intervals.  The patient had the operative extremity prepped and draped in the standard surgical fashion.    The previous surgical incision was ellipsed out sharply with a knife. There was frank pus under pressure then immediately came out of the surgical wound. This was cultured and then broad-spectrum antibiotics were started. I then performed sharp excisional debridement of the skin, muscle, fascia with a knife, cautery, Roger of all infected and necrotic tissue. I then continued to carry  by dissection down to the hip joint. The pericapsular scar tissue was resected in order to expose the joint. I then dislocated the joint carefully and removed the femoral head using a bone tamp. I then externally rotated the left leg to 90 in order to expose the acetabulum. Retractors were placed in order to gain exposure of the acetabulum. The polyethylene liner was then extracted using the device. Once I had adequate debridement the wound was thoroughly irrigated with 9 L of normal saline using pulse lavage. I then put a new femoral head and acetabular polyethylene liner in. Stability of the hip was again stable at 90 of external rotation and hip extension. 10 mL of antibiotic beads were then placed in the wound. The wound was then closed in layer fashion using #1 PDS, 0 PDS, 2-0 Monocryl, 2-0 nylon. Complex wound repair had to be performed in order to close the wound. Sterile dressings were applied. Patient tolerated procedure well and no immediate competitions.  POSTOPERATIVE PLAN: Patient will be admitted for IV antibiotics. We will obtain an infectious disease consult in the morning.  Azucena Cecil, MD [place] 10:01 PM

## 2016-07-26 DIAGNOSIS — Z888 Allergy status to other drugs, medicaments and biological substances status: Secondary | ICD-10-CM

## 2016-07-26 DIAGNOSIS — Z800 Family history of malignant neoplasm of digestive organs: Secondary | ICD-10-CM

## 2016-07-26 DIAGNOSIS — Z8489 Family history of other specified conditions: Secondary | ICD-10-CM

## 2016-07-26 DIAGNOSIS — Y792 Prosthetic and other implants, materials and accessory orthopedic devices associated with adverse incidents: Secondary | ICD-10-CM

## 2016-07-26 DIAGNOSIS — Z820 Family history of epilepsy and other diseases of the nervous system: Secondary | ICD-10-CM

## 2016-07-26 DIAGNOSIS — T8452XD Infection and inflammatory reaction due to internal left hip prosthesis, subsequent encounter: Secondary | ICD-10-CM

## 2016-07-26 DIAGNOSIS — Z87891 Personal history of nicotine dependence: Secondary | ICD-10-CM

## 2016-07-26 DIAGNOSIS — Z8249 Family history of ischemic heart disease and other diseases of the circulatory system: Secondary | ICD-10-CM

## 2016-07-26 DIAGNOSIS — B9689 Other specified bacterial agents as the cause of diseases classified elsewhere: Secondary | ICD-10-CM

## 2016-07-26 DIAGNOSIS — M00852 Arthritis due to other bacteria, left hip: Secondary | ICD-10-CM

## 2016-07-26 LAB — BASIC METABOLIC PANEL
Anion gap: 11 (ref 5–15)
BUN: 12 mg/dL (ref 6–20)
CO2: 26 mmol/L (ref 22–32)
Calcium: 7.9 mg/dL — ABNORMAL LOW (ref 8.9–10.3)
Chloride: 95 mmol/L — ABNORMAL LOW (ref 101–111)
Creatinine, Ser: 0.97 mg/dL (ref 0.44–1.00)
Glucose, Bld: 215 mg/dL — ABNORMAL HIGH (ref 65–99)
POTASSIUM: 3.9 mmol/L (ref 3.5–5.1)
SODIUM: 132 mmol/L — AB (ref 135–145)

## 2016-07-26 LAB — POCT I-STAT 4, (NA,K, GLUC, HGB,HCT)
GLUCOSE: 183 mg/dL — AB (ref 65–99)
HEMATOCRIT: 28 % — AB (ref 36.0–46.0)
HEMOGLOBIN: 9.5 g/dL — AB (ref 12.0–15.0)
Potassium: 5.4 mmol/L — ABNORMAL HIGH (ref 3.5–5.1)
Sodium: 132 mmol/L — ABNORMAL LOW (ref 135–145)

## 2016-07-26 LAB — CBC
HCT: 23.9 % — ABNORMAL LOW (ref 36.0–46.0)
Hemoglobin: 8.1 g/dL — ABNORMAL LOW (ref 12.0–15.0)
MCH: 29.7 pg (ref 26.0–34.0)
MCHC: 33.9 g/dL (ref 30.0–36.0)
MCV: 87.5 fL (ref 78.0–100.0)
PLATELETS: 245 10*3/uL (ref 150–400)
RBC: 2.73 MIL/uL — AB (ref 3.87–5.11)
RDW: 14.5 % (ref 11.5–15.5)
WBC: 23.3 10*3/uL — AB (ref 4.0–10.5)

## 2016-07-26 LAB — SYNOVIAL CELL COUNT + DIFF, W/ CRYSTALS
BASOPHILS, %: 0 %
EOSINOPHILS-SYNOVIAL: 0 % (ref 0–2)
LYMPHOCYTES-SYNOVIAL FLD: 3 % (ref 0–74)
Monocyte/Macrophage: 2 % (ref 0–69)
Neutrophil, Synovial: 95 % — ABNORMAL HIGH (ref 0–24)
Synoviocytes, %: 0 % (ref 0–15)
WBC, Synovial: 292720 cells/uL — ABNORMAL HIGH (ref <150)

## 2016-07-26 NOTE — Progress Notes (Signed)
Patient is active at the [facility] Dr. Dallas Schimke PCP. She has [facility] 100% ( [employer]).  Patient reports she is active with [facility] and receiving  HHPT services following her recent  LEFT TOTAL HIP ARTHROPLASTY ANTERIOR  06/01/16. CM will continue to follow up for transitional care planning.

## 2016-07-26 NOTE — Evaluation (Signed)
Physical Therapy Evaluation Patient Details Name: Kristin [name] MRN: [id number] DOB: Jul 06, 1955 Today's Date: 07/26/2016   History of Present Illness  61 yo female admitted on 06/01/16 for left THA direct anterior approach. PMH significant for HTN, Anxiety and Depression. She presented with acute left prosthetic hip infection for 1 week., s/p i&D and hip revision 07/25/16.  Clinical Impression  Pt will have adequate family support at home upon discharge.  She was ambulating with cane prior to admission, now reliant on RW to manage her pain and for balance. She and her husband provide assist to care for her aunt.  Will continue to follow patient while on this venue of care to progress mobility.    Follow Up Recommendations Home health PT;Supervision - Intermittent    Equipment Recommendations  None recommended by PT    Recommendations for Other Services OT consult     Precautions / Restrictions Precautions Precautions: Fall;Anterior Hip Precaution Booklet Issued: No Restrictions Weight Bearing Restrictions: No      Mobility  Bed Mobility Overal bed mobility: Needs Assistance Bed Mobility: Supine to Sit     Supine to sit: Min assist     General bed mobility comments: raised HOB and use of rail  Transfers Overall transfer level: Needs assistance Equipment used: Rolling walker (2 wheeled) Transfers: Sit to/from [facility] Sit to Stand: Min assist Stand pivot transfers: Min assist       General transfer comment: from bed and from [facility]  Ambulation/Gait Ambulation/Gait assistance: Min assist Ambulation Distance (Feet): 50 Feet Assistive device: Rolling walker (2 wheeled) Gait Pattern/deviations: Step-through pattern;Decreased step length - left;Decreased stance time - left     General Gait Details: heavy reliance on walker support, good safety with walker  Stairs            Wheelchair Mobility    Modified Rankin (Stroke Patients Only)        Balance Overall balance assessment: Needs assistance Sitting-balance support: No upper extremity supported;Feet supported Sitting balance-Leahy Scale: Good     Standing balance support: Single extremity supported Standing balance-Leahy Scale: Fair Standing balance comment: able to perform perianal hygeine after toileting with therapist providing balance assist                             Pertinent Vitals/Pain Pain Assessment: 0-10 Pain Score: 7  Pain Location: L hip Pain Descriptors / Indicators: Aching;Sore Pain Intervention(s): Limited activity within patient's tolerance;Repositioned;Monitored during session;Ice applied    Home Living Family/patient expects to be discharged to:: Private residence Living Arrangements: Spouse/significant other;Other relatives Available Help at Discharge: Family;Friend(s);Available 24 hours/day Type of Home: House Home Access: Stairs to enter Entrance Stairs-Rails: None Entrance Stairs-Number of Steps: 2 Home Layout: One level Home Equipment: Walker - 2 wheels;Grab bars - toilet;Grab bars - tub/shower;Hand held shower head;Bedside commode      Prior Function Level of Independence: Independent with assistive device(s)         Comments: independent, and 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 ; more difficulty with ADL over last 2 weeks. Completed therapies, ambulate with SPC     Hand Dominance   Dominant Hand: Right    Extremity/Trunk Assessment   Upper Extremity Assessment Upper Extremity Assessment: Overall WFL for tasks assessed    Lower Extremity Assessment Lower Extremity Assessment: Generalized weakness    Cervical / Trunk Assessment Cervical / Trunk Assessment: Normal  Communication   Communication: No difficulties  Cognition Arousal/Alertness: Awake/alert  Behavior During Therapy: WFL for tasks assessed/performed Overall Cognitive Status: Within Functional Limits for tasks assessed                       General Comments      Exercises     Assessment/Plan    PT Assessment Patient needs continued PT services  PT Problem List Decreased strength;Decreased range of motion;Decreased activity tolerance;Decreased balance;Decreased mobility;Pain          PT Treatment Interventions DME instruction;Gait training;Stair training;Functional mobility training;Therapeutic activities;Therapeutic exercise;Balance training;Neuromuscular re-education;Patient/family education;Modalities    PT Goals (Current goals can be found in the Care Plan section)  Acute Rehab PT Goals Patient Stated Goal: return home PT Goal Formulation: With patient Time For Goal Achievement: 08/05/16 Potential to Achieve Goals: Good    Frequency Min 5X/week   Barriers to discharge        Co-evaluation               End of Session Equipment Utilized During Treatment: Gait belt Activity Tolerance: Patient tolerated treatment well Patient left: in chair;with call bell/phone within reach Nurse Communication: Mobility status         Time: 0950-1020 PT Time Calculation (min) (ACUTE ONLY): 30 min   Charges:   PT Evaluation $PT Eval Low Complexity: 1 Procedure PT Treatments $Gait Training: 8-22 mins   PT G CodesSuszanne [name], PT [phone]  [place] 07/26/2016, 3:56 PM

## 2016-07-26 NOTE — Progress Notes (Signed)
Occupational Therapy Evaluation Patient Details Name: JARYIAH HENNES MRN: [id number] DOB: [birth year] Today's Date: 07/26/2016    History of Present Illness 61 yo female admitted on 06/01/16 for left THA direct anterior approach. 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 Pt currently requires min A for mobility and Mod A with LB ADL. Pt will benefit from AE. Will follow acutely to maximize functional level of independence to facilitate safe DC home with husband.     Follow Up Recommendations  Home health OT;Supervision - Intermittent    Equipment Recommendations  Other (comment) (AE)    Recommendations for Other Services       Precautions / Restrictions Precautions Precautions: Fall Restrictions Weight Bearing Restrictions: No      Mobility Bed Mobility Overal bed mobility: Needs Assistance Bed Mobility: Sit to Supine       Sit to supine: Mod assist   General bed mobility comments: difficulty lifting LLE onto bed  Transfers Overall transfer level: Needs assistance Equipment used: Rolling walker (2 wheeled) Transfers: Sit to/from Stand Sit to Stand: Min assist              Balance                                            ADL Overall ADL's : Needs assistance/impaired     Grooming: Set up   Upper Body Bathing: Set up   Lower Body Bathing: Moderate assistance;Sit to/from stand   Upper Body Dressing : Set up   Lower Body Dressing: Moderate assistance;Sit to/from stand   Toilet Transfer: Minimal assistance   Toileting- Clothing Manipulation and Hygiene: Moderate assistance       Functional mobility during ADLs: Minimal assistance General ADL Comments: LB ADL difficult due to pain, limited ROM and body habitus. will benefit from AE     Vision     Perception     Praxis      Pertinent Vitals/Pain Pain Assessment:  0-10 Pain Score: 6  Pain Location: L hip Pain Descriptors / Indicators: Aching;Cramping;Grimacing;Guarding Pain Intervention(s): Limited activity within patient's tolerance;Repositioned     Hand Dominance Right   Extremity/Trunk Assessment Upper Extremity Assessment Upper Extremity Assessment: Overall WFL for tasks assessed   Lower Extremity Assessment Lower Extremity Assessment: Defer to PT evaluation   Cervical / Trunk Assessment Cervical / Trunk Assessment: Normal   Communication Communication Communication: No difficulties   Cognition Arousal/Alertness: Awake/alert Behavior During Therapy: WFL for tasks assessed/performed Overall Cognitive Status: Within Functional Limits for tasks assessed                     General Comments       Exercises       Shoulder Instructions      Home Living Family/patient expects to be discharged to:: Private residence Living Arrangements: Spouse/significant other;Other relatives Available Help at Discharge: Family;Friend(s);Available 24 hours/day Type of Home: House Home Access: Stairs to enter [employer] of Steps: 2 Entrance Stairs-Rails: None Home Layout: One level     Bathroom Shower/Tub: Tub/shower unit Shower/tub characteristics: Engineer, building services: Standard Bathroom Accessibility: Yes How Accessible: Accessible via wheelchair Home Equipment: Walker - 2 wheels;Grab bars - toilet;Grab bars - tub/shower;Hand held shower head  Prior Functioning/Environment Level of Independence: Independent        Comments: independent, and 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 ; more difficulty with ADL over last 2 weeks        OT Problem List: Decreased strength;Decreased range of motion;Impaired balance (sitting and/or standing);Decreased knowledge of use of DME or AE;Obesity;Pain   OT Treatment/Interventions: Self-care/ADL training;DME and/or AE instruction;Therapeutic activities;Therapeutic  exercise;Patient/family education;Balance training    OT Goals(Current goals can be found in the care plan section) Acute Rehab OT Goals Patient Stated Goal: to be independent with taking care of myself OT Goal Formulation: With patient Time For Goal Achievement: 08/09/16 Potential to Achieve Goals: Good  OT Frequency: Min 3X/week   Barriers to D/C:            Co-evaluation              End of Session Equipment Utilized During Treatment: Gait belt;Rolling walker Nurse Communication: Mobility status  Activity Tolerance: Patient tolerated treatment well Patient left: in bed;with call bell/phone within reach   Time: 1426-1438 OT Time Calculation (min): 12 min Charges:  OT General Charges $OT Visit: 1 Procedure OT Evaluation $OT Eval Moderate Complexity: 1 Procedure G-Codes:    China Deitrick,HILLARY 08/15/16, 3:38 PM   [facility], OTR/L  [phone] 15-Aug-2016

## 2016-07-26 NOTE — Anesthesia Postprocedure Evaluation (Addendum)
Anesthesia Post Note  Patient: Kristin [name]  Procedure(s) Performed: Procedure(s) (LRB): LEFT HIP IRRIGATION AND DEBRIDEMENT, REVISION OF HEAD AND LINER (Left)  Patient location during evaluation: PACU Anesthesia Type: General Level of consciousness: awake and alert Pain management: pain level controlled Vital Signs Assessment: post-procedure vital signs reviewed and stable Respiratory status: spontaneous breathing, nonlabored ventilation, respiratory function stable and patient connected to nasal cannula oxygen Cardiovascular status: blood pressure returned to baseline and stable Postop Assessment: no signs of nausea or vomiting Anesthetic complications: no       Last Vitals:  Vitals:   07/25/16 2216 07/26/16 0037  BP: 109/85 116/72  Pulse: (!) 107 100  Resp: 18 18  Temp: 36.4 C 36.4 C    Last Pain:  Vitals:   07/26/16 0037  TempSrc: Oral  PainSc:                  Kaston Faughn S

## 2016-07-26 NOTE — Consult Note (Signed)
[facility] for Infectious Disease       Reason for Consult: PJI    Referring Physician: [name]  Active Problems:   Status post left hip replacement   . sodium chloride   Intravenous Once  . acetaminophen  1,000 mg Oral Q6H  . amitriptyline  75 mg Oral BID  . aspirin EC  325 mg Oral BID  . hydrochlorothiazide  25 mg Oral Daily  . ketorolac  30 mg Intravenous Q6H  . metoprolol tartrate  25 mg Oral BID  . oxyCODONE  10 mg Oral Q12H  . pantoprazole  40 mg Oral Daily  . piperacillin-tazobactam (ZOSYN)  IV  3.375 g Intravenous Q8H  . tranexamic acid (CYKLOKAPRON) topical -INTRAOP  2,000 mg Topical Once  . 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  Gram stain with GPC.    Antibiotics: Vancomycin and zosyn  HPI: Kristin [name] is a 61 y.o. 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 Phoebe Sharps office with increased pain and brought for I and D 1/22 and noted pus.  Underwent surgery as above and no growth yet on culture but GPC on gram stain.  No current pain, no fever, no chills.  292,000 WBCs, baseline CRP 258, ESR 71.    Review of Systems:  Constitutional: negative for fevers and chills Respiratory: negative for cough Gastrointestinal: negative for diarrhea Integument/breast: negative for rash All other systems reviewed and are negative    Past Medical History:  Diagnosis Date  . Allergy   . Anemia   . Anxiety   . Arthritis   . Back pain, chronic   . Bronchitis   . Cough   . Depression   . GERD (gastroesophageal reflux disease)   . Hyperlipidemia   . Hypertension   . Obesity   . 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 Seizures ([place])    last >10 yrs ago  . Somatization disorder   . Tobacco abuse     Social  History  Substance Use Topics  . Smoking status: Former Smoker    Packs/day: 0.25    Years: 0.50    Types: Cigarettes    Quit date: 01/27/2014  . Smokeless tobacco: Never Used  . Alcohol use No    Family History  Problem Relation Age of Onset  . Other Mother     failed surgery  . Heart Problems Father     poss MI, not sure:per pt  . Alzheimer's disease Maternal Grandmother   . Heart disease Maternal Grandfather   . Colon cancer Maternal Uncle     not sure age of onset    Allergies  Allergen Reactions  . Fluconazole Swelling    REACTION: Face swelling  . Robitussin (Alcohol Free) [Guaifenesin] Palpitations    Chest pain    Physical Exam: Constitutional: in no apparent distress and alert  Vitals:   07/26/16 0154 07/26/16 0507  BP: 122/78 106/60  Pulse: 97 95  Resp: 18 18  Temp: 97.8 F (36.6 C) 97.8 F (36.6 C)   EYES: anicteric ENMT: no thrush Cardiovascular: Cor RRR Respiratory: CTA B; normal respiratory effort GI: Bowel sounds are normal, liver is not enlarged, spleen is not enlarged Musculoskeletal: no pedal edema noted Skin:  negatives: no rash Neuro; non focal  Lab Results  Component Value Date   WBC 23.3 (H) 07/26/2016   HGB 8.1 (L) 07/26/2016   HCT 23.9 (L) 07/26/2016   MCV 87.5 07/26/2016   PLT 245 07/26/2016    Lab Results  Component Value Date   CREATININE 0.97 07/26/2016   BUN 12 07/26/2016   NA 132 (L) 07/26/2016   K 3.9 07/26/2016   CL 95 (L) 07/26/2016   CO2 26 07/26/2016    Lab Results  Component Value Date   ALT 17 05/23/2016   AST 24 05/23/2016   ALKPHOS 112 05/23/2016     Microbiology: Recent Results (from the past 240 hour(s))  Aerobic/Anaerobic Culture (surgical/deep wound)     Status: None (Preliminary result)   Collection Time: 07/25/16  6:27 PM  Result Value Ref Range Status   Specimen Description WOUND LEFT HIP  Final   Special Requests SYNOVIAL  Final   Gram Stain   Final    ABUNDANT WBC PRESENT,BOTH PMN AND  MONONUCLEAR MODERATE GRAM POSITIVE COCCI IN CLUSTERS    Culture PENDING  Incomplete   Report Status PENDING  Incomplete    Dionysios Massman, Herbie [place], [facility] for Infectious Disease [facility] [email or web address] [phone] pager  [phone] cell 07/26/2016, 1:29 PM

## 2016-07-26 NOTE — Progress Notes (Addendum)
   Subjective:  Patient reports pain as moderate.  No events.  Objective:   VITALS:   Vitals:   07/25/16 2330 07/26/16 0037 07/26/16 0154 07/26/16 0507  BP: 110/80 116/72 122/78 106/60  Pulse: 100 100 97 95  Resp: 18 18 18 18   Temp: 97.5 F (36.4 C) 97.5 F (36.4 C) 97.8 F (36.6 C) 97.8 F (36.6 C)  TempSrc: Oral Oral Oral Oral  SpO2: 100% 100% 100% 98%  Weight:      Height:        Neurologically intact Neurovascular intact Sensation intact distally Intact pulses distally Dorsiflexion/Plantar flexion intact Incision: dressing C/D/I and no drainage No cellulitis present Compartment soft   Lab Results  Component Value Date   WBC 17.3 (H) 07/25/2016   HGB 9.2 (L) 07/25/2016   HCT 28.2 (L) 07/25/2016   MCV 89.0 07/25/2016   PLT 336 07/25/2016     Assessment/Plan:  1 Day Post-Op   - Expected postop acute blood loss anemia - will monitor for symptoms - Up with PT/OT - DVT ppx - SCDs, ambulation, aspirin - WBAT operative extremity - Pain control - continue vanc and zosyn - cultures pending, GPCs so far - repeat CBC this am - ID consult  Eduard Roux 07/26/2016, 8:02 AM [phone]

## 2016-07-26 NOTE — Progress Notes (Addendum)
Pt complaining of oozing left hip wound. Paged provider yates to inform of pt having left leg swelling as well as leakage from surgical site. Neurovascular assessment was done with positive pulses. Pt was given pain medicine as well as ice to surgical site. Family and pt updated and provider notified. Per provider, assess pt's site, apply new dressing to site, give pain medication, apply ice, and inform/educate pt on POC. Provider Yates to come to bedside to assess pt and update family on POC.

## 2016-07-26 NOTE — Progress Notes (Signed)
Patient ID: Kristin [name], female   DOB: Nov 19, 1955, 61 y.o.   MRN: [id number] Nurse called me stated family wanted me to come tonight to examine patient. He should postop from I and D of her hip with liner exchange. She has bioabsorbable beads are present or hip joint as having copious serous drainage from her hip. She has some swelling in her thigh has been on SCDs and aspirin. No history of DVT and no history of PE. Patient has some's by swelling postop. Pulses are intact ankle dorsiflexion plantar flexion is intact. She is afebrile. Pain control was a problem. Morphine increased encouraged the patient to use the oxycodone as prescribed. One granddaughter, 3 daughters ,one husband are present in the room. I originally talked to the granddaughter on the phone who is 34 and actually is a patient of mine from previous knee surgery. All family members were at the bedside and are very concerned. Patient's been using the bedpan and she is afebrile. Nurse offered her morphine and she did not want to take any. I discussed the patient with the bioabsorbable beads present. Have more serous drainage and will continue to have serous drainage for many days and that this is very typical when the antibiotic beads are placed to help Of infection. Cultures are pending. Will order a Doppler test since she has some thigh swelling and some calf swelling. She is using her teds also her SCDs as ordered. I reviewed the x-rays with multiple family members. Change the dressing checked the incision, answered multiple questions. They understand outlined treatment plan and had no further questions. Charge nurse was present in the room as well as the patient's nurse since the family has been making multiple calls about care etc. At this time they understand the the treatment plan and had no further questions. They expressed that they were happy that they had all their questions answered and I told him that they can call if they have any  further questions.

## 2016-07-27 ENCOUNTER — Inpatient Hospital Stay (HOSPITAL_COMMUNITY): Payer: No Typology Code available for payment source

## 2016-07-27 ENCOUNTER — Encounter (HOSPITAL_COMMUNITY): Payer: Self-pay | Admitting: Orthopaedic Surgery

## 2016-07-27 DIAGNOSIS — Z9889 Other specified postprocedural states: Secondary | ICD-10-CM

## 2016-07-27 LAB — POCT I-STAT 4, (NA,K, GLUC, HGB,HCT)
Glucose, Bld: 151 mg/dL — ABNORMAL HIGH (ref 65–99)
HEMATOCRIT: 19 % — AB (ref 36.0–46.0)
Hemoglobin: 6.5 g/dL — CL (ref 12.0–15.0)
Potassium: 3.7 mmol/L (ref 3.5–5.1)
Sodium: 133 mmol/L — ABNORMAL LOW (ref 135–145)

## 2016-07-27 LAB — PREPARE RBC (CROSSMATCH)

## 2016-07-27 LAB — HEMOGLOBIN AND HEMATOCRIT, BLOOD
HEMATOCRIT: 28.1 % — AB (ref 36.0–46.0)
Hemoglobin: 9.6 g/dL — ABNORMAL LOW (ref 12.0–15.0)

## 2016-07-27 LAB — GLUCOSE, CAPILLARY
GLUCOSE-CAPILLARY: 173 mg/dL — AB (ref 65–99)
GLUCOSE-CAPILLARY: 188 mg/dL — AB (ref 65–99)
GLUCOSE-CAPILLARY: 128 mg/dL — AB (ref 65–99)
GLUCOSE-CAPILLARY: 115 mg/dL — AB (ref 65–99)

## 2016-07-27 LAB — CBC
HCT: 20 % — ABNORMAL LOW (ref 36.0–46.0)
HEMOGLOBIN: 6.7 g/dL — AB (ref 12.0–15.0)
MCH: 29.4 pg (ref 26.0–34.0)
MCHC: 33.5 g/dL (ref 30.0–36.0)
MCV: 87.7 fL (ref 78.0–100.0)
Platelets: 278 10*3/uL (ref 150–400)
RBC: 2.28 MIL/uL — AB (ref 3.87–5.11)
RDW: 14.5 % (ref 11.5–15.5)
WBC: 24.1 10*3/uL — ABNORMAL HIGH (ref 4.0–10.5)

## 2016-07-27 MED ORDER — SODIUM CHLORIDE 0.9% FLUSH
10.0000 mL | INTRAVENOUS | Status: DC | PRN
  Administered 2016-07-28 – 2016-08-05 (×6): 10 mL
  Filled 2016-07-27 (×6): qty 40

## 2016-07-27 MED ORDER — INSULIN ASPART 100 UNIT/ML ~~LOC~~ SOLN: 0.0000 [IU] | Freq: Every day | SUBCUTANEOUS | Status: DC

## 2016-07-27 MED ORDER — INSULIN ASPART 100 UNIT/ML ~~LOC~~ SOLN
0.0000 [IU] | Freq: Three times a day (TID) | SUBCUTANEOUS | Status: DC
  Administered 2016-07-27 (×2): 4 [IU] via SUBCUTANEOUS
  Administered 2016-07-28 – 2016-08-05 (×8): 3 [IU] via SUBCUTANEOUS

## 2016-07-27 MED ORDER — SODIUM CHLORIDE 0.9 % IV SOLN
Freq: Once | INTRAVENOUS | Status: AC
  Administered 2016-07-27: 12:00:00 via INTRAVENOUS

## 2016-07-27 NOTE — Progress Notes (Signed)
Occupational Therapy Treatment Patient Details Name: Kristin [name] MRN: [id number] DOB: 13-Oct-1955 Today's Date: 07/27/2016    History of present illness 61 yo female admitted on 06/01/16 for left THA direct anterior approach. PMH significant for HTN, Anxiety and Depression. She presented with acute left prosthetic hip infection for 1 week., s/p i&D and hip revision 07/25/16.   OT comments  Pt making progress with functional goals. Pt educated on ADL A/E for home use and issued hip kit. Session limited due to transport here to take pt off unit for testing  Follow Up Recommendations  Home health OT;Supervision - Intermittent    Equipment Recommendations   issued hip kit   Recommendations for Other Services      Precautions / Restrictions Precautions Precautions: Fall;Anterior Hip Precaution Booklet Issued: No Restrictions Weight Bearing Restrictions: No       Mobility Bed Mobility Overal bed mobility: Needs Assistance Bed Mobility: Supine to Sit;Sit to Supine     Supine to sit: Min assist Sit to supine: Mod assist   General bed mobility comments: mod A with LEs back onto bed, used rails  Transfers Overall transfer level: Needs assistance Equipment used: Rolling walker (2 wheeled) Transfers: Sit to/from [facility] Sit to Stand: Min assist Stand pivot transfers: Min assist       General transfer comment: from bed and from [facility]    Balance Overall balance assessment: Needs assistance         Standing balance support: Single extremity supported;Bilateral upper extremity supported;During functional activity Standing balance-Leahy Scale: Fair                     ADL Overall ADL's : Needs assistance/impaired     Grooming: Wash/dry hands;Wash/dry face;Standing;Min guard       Lower Body Bathing: Moderate assistance;Sit to/from stand (simulated with LH sponge)   Upper Body Dressing :  (simulated with reacher)   Lower Body Dressing:  Moderate assistance;Sit to/from stand   Toilet Transfer: Minimal assistance;BSC;Stand-pivot;Ambulation;RW   Toileting- Clothing Manipulation and Hygiene: Minimal assistance;Sit to/from stand       Functional mobility during ADLs: Minimal assistance General ADL Comments: Pt educated on ADL A/E for home use and able to return demo with simulated tasks                    Cognition   Behavior During Therapy: [facility] for tasks assessed/performed Overall Cognitive Status: Within Functional Limits for tasks assessed                       Extremity/Trunk Assessment   WFL                        General Comments  Pt very pleasant and cooperative    Pertinent Vitals/ Pain       Pain Assessment: 0-10 Pain Score: 5  Pain Location: L hip Pain Descriptors / Indicators: Aching;Sore Pain Intervention(s): Monitored during session;Premedicated before session;Repositioned                                                          Frequency  Min 3X/week        Progress Toward Goals  OT Goals(current goals can now be found in the care  plan section)  Progress towards OT goals: Progressing toward goals  Acute Rehab OT Goals Patient Stated Goal: return home ADL Goals Pt Will Perform Lower Body Bathing: with set-up;with supervision;with adaptive equipment;sit to/from stand Pt Will Perform Lower Body Dressing: with set-up;with supervision;with adaptive equipment;sit to/from stand Pt Will Transfer to Toilet: with supervision;ambulating;bedside commode Pt Will Perform Toileting - Clothing Manipulation and hygiene: with modified independence;sitting/lateral leans;sit to/from stand;with adaptive equipment Pt Will Perform Tub/Shower Transfer: Tub transfer;3 in 1;rolling walker;ambulating (drop arm wide BSC)  Plan Discharge plan remains appropriate                     End of Session Equipment Utilized During Treatment: Gait belt;Rolling  walker;Other (comment) (BSC)   Activity Tolerance Patient tolerated treatment well   Patient Left in bed;with call bell/phone within reach;Other (comment) (transport to take pt off unit for testing)             Time: 9242-6834 OT Time Calculation (min): 17 min  Charges: OT General Charges $OT Visit: 1 Procedure OT Treatments $Self Care/Home Management : 8-22 mins  Britt Bottom 07/27/2016, 12:33 PM

## 2016-07-27 NOTE — Progress Notes (Signed)
PT Cancellation Note  Patient Details Name: Kristin [name] MRN: [id number] DOB: [birth year]   Cancelled Treatment:    Reason Eval/Treat Not Completed: Medical issues which prohibited therapy;Patient not medically ready (Pt unable to be seen due to hemoglobin levels being at 6.7 and pic line being administered. )   Araceli Bouche 07/27/2016, 3:28 PM [place], [place] Pager [phone]

## 2016-07-27 NOTE — Progress Notes (Signed)
Peripherally Inserted Central Catheter/Midline Placement  The IV Nurse has discussed with the patient and/or persons authorized to consent for the patient, the purpose of this procedure and the potential benefits and risks involved with this procedure.  The benefits include less needle sticks, lab draws from the catheter, and the patient may be discharged home with the catheter. Risks include, but not limited to, infection, bleeding, blood clot (thrombus formation), and puncture of an artery; nerve damage and irregular heartbeat and possibility to perform a PICC exchange if needed/ordered by physician.  Alternatives to this procedure were also discussed.  Bard Power PICC patient education guide, fact sheet on infection prevention and patient information card has been provided to patient /or left at bedside.    PICC/Midline Placement Documentation        Kristin [name] 07/27/2016, 4:05 PM

## 2016-07-27 NOTE — Progress Notes (Addendum)
*  PRELIMINARY RESULTS* Vascular Ultrasound Left lower extremity venous duplex has been completed.  Preliminary findings: unable to image left upper thigh due to bandages. No evidence of DVT in visualized veins of LLE. Left baker's cyst noted.    Landry Mellow, RDMS, RVT   07/27/2016, 10:22 AM

## 2016-07-27 NOTE — Progress Notes (Signed)
   Subjective:  Patient reports pain as moderate.  No events.  Objective:   VITALS:   Vitals:   07/26/16 0507 07/26/16 1542 07/26/16 2151 07/27/16 0618  BP: 106/60 115/66 133/70 105/62  Pulse: 95 93 (!) 105 82  Resp: 18 16 18 18   Temp: 97.8 F (36.6 C) 98.3 F (36.8 C) 98.3 F (36.8 C) 97.5 F (36.4 C)  TempSrc: Oral Oral Oral Oral  SpO2: 98% 100% 100% 100%  Weight:      Height:        Moderate serosanguinous drainage from left hip incision, no pus   Lab Results  Component Value Date   WBC 23.3 (H) 07/26/2016   HGB 8.1 (L) 07/26/2016   HCT 23.9 (L) 07/26/2016   MCV 87.5 07/26/2016   PLT 245 07/26/2016     Assessment/Plan:  2 Days Post-Op   - Expected postop acute blood loss anemia - will monitor for symptoms - Up with PT/OT - DVT ppx - SCDs, ambulation, aspirin - WBAT operative extremity - continue vanc and zosyn - staph, await speciation - ID consult - PICC ordered - transfer to ortho floor if bed available  Eduard Roux 07/27/2016, 7:01 AM [phone]

## 2016-07-27 NOTE — Progress Notes (Signed)
[place] pt referral to [facility] for [facility] services for home IV ABX.  [facility] has obtained needed charity information from patient and has submitted tonight for [facility] review to see if patient meets charity guidelines.  Due to time of day, we will not have final answer until tomorrow.  We will update the Case Manager at that time for referral acceptance status.  If patient discharges after hours, please call [phone].   Larry Sierras 07/27/2016, 6:12 PM

## 2016-07-27 NOTE — Care Management Note (Addendum)
Case Management Note  Patient Details  Name: Kristin [name] MRN: [id number] Date of Birth: 04/03/1956  Subjective/Objective:   Pt presented with acute left prosthetic hip infection for 1 week., s/p i&D and hip revision 07/25/16. PMH significant for HTN, Anxiety and Depression, s/p L THA. Resides with husband and aunt. States independent with ADL's  PTA. DME: cane, walker, wheelchair, 3 in 1/BSC.  HUGH KENTON (Spouse)     [phone]         PCP: Georga Bora   Action/Plan:   Status post left hip replacement/ Gram stain with GPC......ID following...Marland KitchenMarland KitchenMarland Kitchenlong term IV antibiotics needed. PICC line to be placed. CM to f/u with disposition needs.   Expected Discharge Date:                  Expected Discharge Plan:  [place]  In-House Referral:     Discharge planning Services  CM Consult  Post Acute Care Choice:   Choice offered to:  Patient  DME Arranged:    DME Agency:     HH Arranged:   [place] / CM made referral to [facility] @ [phone] for potential IV ABX home infusion/ charity case. Pt without health insurance.  Status of Service:     If discussed at Long Length of Stay Meetings, dates discussed:    Additional Comments:  Sharin Mons, RN 07/27/2016, 3:28 PM

## 2016-07-28 DIAGNOSIS — M00052 Staphylococcal arthritis, left hip: Secondary | ICD-10-CM

## 2016-07-28 DIAGNOSIS — B9562 Methicillin resistant Staphylococcus aureus infection as the cause of diseases classified elsewhere: Secondary | ICD-10-CM

## 2016-07-28 LAB — TYPE AND SCREEN
ABO/RH(D): A POS
Antibody Screen: NEGATIVE
UNIT DIVISION: 0
Unit division: 0
Unit division: 0
Unit division: 0

## 2016-07-28 LAB — BODY FLUID CULTURE: Gram Stain: NONE SEEN

## 2016-07-28 LAB — GLUCOSE, CAPILLARY
GLUCOSE-CAPILLARY: 108 mg/dL — AB (ref 65–99)
Glucose-Capillary: 131 mg/dL — ABNORMAL HIGH (ref 65–99)
Glucose-Capillary: 127 mg/dL — ABNORMAL HIGH (ref 65–99)
Glucose-Capillary: 95 mg/dL (ref 65–99)

## 2016-07-28 LAB — CBC
HCT: 28.1 % — ABNORMAL LOW (ref 36.0–46.0)
Hemoglobin: 9.4 g/dL — ABNORMAL LOW (ref 12.0–15.0)
MCH: 30.1 pg (ref 26.0–34.0)
MCHC: 33.5 g/dL (ref 30.0–36.0)
MCV: 90.1 fL (ref 78.0–100.0)
Platelets: 312 10*3/uL (ref 150–400)
RBC: 3.12 MIL/uL — ABNORMAL LOW (ref 3.87–5.11)
RDW: 14.4 % (ref 11.5–15.5)
WBC: 17.1 10*3/uL — ABNORMAL HIGH (ref 4.0–10.5)

## 2016-07-28 LAB — VANCOMYCIN, TROUGH: Vancomycin Tr: 9 ug/mL — ABNORMAL LOW (ref 15–20)

## 2016-07-28 NOTE — Progress Notes (Signed)
Occupational Therapy Treatment Patient Details Name: Kristin [name] MRN: [id number] DOB: Aug 15, 1955 Today's Date: 07/28/2016    History of present illness 61 yo female admitted on 06/01/16 for left THA direct anterior approach. PMH significant for HTN, Anxiety and Depression. She presented with acute left prosthetic hip infection for 1 week., s/p i&D and hip revision 07/25/16.   OT comments  Pt making progress with functional goals. Pt transferred to Ortho floor last night and now on contact precautions for MRSA; has PICC and VAC. OT will continue to follow acutely  Follow Up Recommendations  Home health OT;Supervision - Intermittent    Equipment Recommendations       Recommendations for Other Services      Precautions / Restrictions Precautions Precautions: Fall;Anterior Hip Restrictions Weight Bearing Restrictions: Yes LLE Weight Bearing: Weight bearing as tolerated       Mobility Bed Mobility Overal bed mobility: Needs Assistance Bed Mobility: Supine to Sit     Supine to sit: Min assist     General bed mobility comments: min A with L LE to EOB, used rails  Transfers Overall transfer level: Needs assistance Equipment used: Rolling walker (2 wheeled) Transfers: Sit to/from [facility] Sit to Stand: Min assist;Min guard Stand pivot transfers: Min guard            Balance Overall balance assessment: Needs assistance   Sitting balance-Leahy Scale: Good     Standing balance support: Single extremity supported;Bilateral upper extremity supported;During functional activity Standing balance-Leahy Scale: Fair Standing balance comment: able to perform perianal hygeine after toileting with therapist providing balance assist                   ADL Overall ADL's : Needs assistance/impaired     Grooming: Wash/dry hands;Wash/dry face;Standing;Set up;Supervision/safety       Lower Body Bathing: Moderate assistance;Sit to/from stand;Minimal  assistance (simulated with A/E)   Upper Body Dressing : Set up;Sitting   Lower Body Dressing: Moderate assistance;Sit to/from stand (simulated with A/E)   Toilet Transfer: BSC;Ambulation;RW;Min guard;Minimal assistance   Toileting- Clothing Manipulation and Hygiene: Minimal assistance;Sit to/from stand;Min guard     Tub/Shower Transfer Details (indicate cue type and reason): Not addressed, pt not on contact precautions and unbale to bring tub bench to her room  Functional mobility during ADLs: Minimal assistance;Min guard                                        Cognition   Behavior During Therapy: WFL for tasks assessed/performed Overall Cognitive Status: Within Functional Limits for tasks assessed                       Extremity/Trunk Assessment   WFL                        General Comments  pt very pleasant and cooperative    Pertinent Vitals/ Pain       Pain Assessment: 0-10 Pain Score: 3  Pain Location: L hip Pain Descriptors / Indicators: Aching;Sore Pain Intervention(s): Monitored during session;Premedicated before session;Repositioned                Frequency  Min 2X/week        Progress Toward Goals  OT Goals(current goals can now be found in the care plan section)  Progress towards OT goals: Progressing  toward goals  Acute Rehab OT Goals Patient Stated Goal: return home  Plan Discharge plan remains appropriate                     End of Session Equipment Utilized During Treatment: Gait belt;Rolling walker;Other (comment) (BSC, LH sponge, reacher)   Activity Tolerance Patient tolerated treatment well   Patient Left in bed;with call bell/phone within reach             Time: [id number] OT Time Calculation (min): 24 min  Charges: OT General Charges $OT Visit: 1 Procedure OT Treatments $Self Care/Home Management : 8-22 mins $Therapeutic Activity: 8-22 mins  Britt Bottom 07/28/2016,  12:56 PM

## 2016-07-28 NOTE — Progress Notes (Signed)
Pharmacy Antibiotic Note  Kristin [name] is a 72 YOF with history of THR on 06/01/16 and presented with left hip pain and swelling.  Now s/p I&D with polyexchange and head exchange, and his wound culture grew MRSA.  Pharmacy consulted to manage vancomycin.  Vancomycin trough was sub-therapeutic at 9 mcg/mL; however, previous dose was not charted as given.  Contacted RN from last night but unable to reach him.  Patient's renal function has been stable.   Plan:  - Continue vanc 750mg  IV Q12H - Monitor renal fxn, clinical progress, repeat vanc trough prior to 4th dose - BMET in AM   Height: 5\' 2"  (157.5 cm) Weight: 250 lb (113.4 kg) IBW/kg (Calculated) : 50.1  Temp (24hrs), Avg:98.1 F (36.7 C), Min:97.7 F (36.5 C), Max:98.4 F (36.9 C)   Recent Labs Lab 07/25/16 1506 07/26/16 0925 07/27/16 0616 07/28/16 0538  WBC 17.3* 23.3* 24.1*  --   CREATININE 0.95 0.97  --   --   VANCOTROUGH  --   --   --  9*    Estimated Creatinine Clearance: 74.3 mL/min (by C-G formula based on SCr of 0.97 mg/dL).    Allergies  Allergen Reactions  . Fluconazole Swelling    REACTION: Face swelling  . Robitussin (Alcohol Free) [Guaifenesin] Palpitations    Chest pain    Antimicrobials this admission:  Zosyn 1/22 >> 1/24 Vanc 1/22 >> Ancef post-op 1/22  Dose adjustments this admission:  1/25 VT = 9 mcg/mL on 750mg  q12 (1/24 PM dose not charted as given) >> no change  Microbiology results:  1/22 L hip join fluid cx - MRSA    Kristin [name] D. Kristin [name], PharmD, BCPS Pager:  [phone] 07/28/2016, 9:13 AM

## 2016-07-28 NOTE — Progress Notes (Signed)
   Subjective:  Patient reports pain as moderate.  No events.  Objective:   VITALS:   Vitals:   07/27/16 1902 07/27/16 2023 07/27/16 2108 07/28/16 0435  BP: 113/66 122/76 123/67 117/63  Pulse: 92 94 97 (!) 105  Resp: 18 16 16 18   Temp: 98 F (36.7 C) 98.2 F (36.8 C) 98.2 F (36.8 C) 97.9 F (36.6 C)  TempSrc: Oral Oral Oral Oral  SpO2: 100% 100% 100% 100%  Weight:      Height:        Moderate serosanguinous drainage from left hip incision, no pus   Lab Results  Component Value Date   WBC 24.1 (H) 07/27/2016   HGB 9.6 (L) 07/27/2016   HCT 28.1 (L) 07/27/2016   MCV 87.7 07/27/2016   PLT 278 07/27/2016     Assessment/Plan:  3 Days Post-Op   - repeat CBC - iVAC placed for continued drainage - MRSA from cultures - PICC line in - ID following - appreciate  Eduard Roux 07/28/2016, 8:31 AM [phone]

## 2016-07-28 NOTE — Progress Notes (Signed)
[place] for Infectious Disease   Reason for visit: Follow up on PJI  Interval History: culture with MRSA; afebrile, some pain; WBC 17; no associated rash, diarrhea, difficulty urinating   Physical Exam: Constitutional:  Vitals:   07/28/16 0435 07/28/16 1300  BP: 117/63 120/67  Pulse: (!) 105 97  Resp: 18 18  Temp: 97.9 F (36.6 C) 97.7 F (36.5 C)   patient appears in NAD Eyes: anicteric HENT: no thrush Respiratory: Normal respiratory effort; CTA B Cardiovascular: RRR GI: soft, nt, nd  Review of Systems: Constitutional: negative for fevers, chills and anorexia Gastrointestinal: negative for nausea and diarrhea Genitourinary: negative for frequency Integument/breast: negative for rash  Lab Results  Component Value Date   WBC 17.1 (H) 07/28/2016   HGB 9.4 (L) 07/28/2016   HCT 28.1 (L) 07/28/2016   MCV 90.1 07/28/2016   PLT 312 07/28/2016    Lab Results  Component Value Date   CREATININE 0.97 07/26/2016   BUN 12 07/26/2016   NA 132 (L) 07/26/2016   K 3.9 07/26/2016   CL 95 (L) 07/26/2016   CO2 26 07/26/2016    Lab Results  Component Value Date   ALT 17 05/23/2016   AST 24 05/23/2016   ALKPHOS 112 05/23/2016     Microbiology: Recent Results (from the past 240 hour(s))  Body fluid culture     Status: None   Collection Time: 07/25/16 10:07 AM  Result Value Ref Range Status   Culture   Final    Moderate METHICILLIN RESISTANT STAPHYLOCOCCUS AUREUS   Gram Stain Moderate  Final   Gram Stain WBC present-both PMN and Mononuclear  Final   Gram Stain No Organisms Seen  Final   Organism ID, Bacteria METHICILLIN RESISTANT STAPHYLOCOCCUS AUREUS  Final    Comment: Rifampin and Gentamicin should not be used as single drugs for treatment of Staph infections. Critical Results Called to,Read Back By and Verified With: LIZ G @ [phone] ON [phone] BY [facility] Report Faxed by Request This organism DOES NOT demonstrate inducible Clindamycin resistance in vitro.       Susceptibility   Methicillin resistant staphylococcus aureus -  (no method available)    OXACILLIN >=4 Resistant     CEFAZOLIN  Resistant     GENTAMICIN <=0.5 Sensitive     CIPROFLOXACIN >=8 Resistant     LEVOFLOXACIN 4 Intermediate     TRIMETH/SULFA <=10 Sensitive     VANCOMYCIN 1 Sensitive     CLINDAMYCIN <=0.25 Sensitive     ERYTHROMYCIN >=8 Resistant     LINEZOLID 2 Sensitive     RIFAMPIN <=0.5 Sensitive     TETRACYCLINE <=1 Sensitive   Aerobic/Anaerobic Culture (surgical/deep wound)     Status: None (Preliminary result)   Collection Time: 07/25/16  6:27 PM  Result Value Ref Range Status   Specimen Description WOUND LEFT HIP  Final   Special Requests SYNOVIAL  Final   Gram Stain   Final    ABUNDANT WBC PRESENT,BOTH PMN AND MONONUCLEAR MODERATE GRAM POSITIVE COCCI IN CLUSTERS    Culture   Final    ABUNDANT METHICILLIN RESISTANT STAPHYLOCOCCUS AUREUS NO ANAEROBES ISOLATED; CULTURE IN PROGRESS FOR 5 DAYS    Report Status PENDING  Incomplete   Organism ID, Bacteria METHICILLIN RESISTANT STAPHYLOCOCCUS AUREUS  Final      Susceptibility   Methicillin resistant staphylococcus aureus - MIC*    CIPROFLOXACIN >=8 RESISTANT Resistant     ERYTHROMYCIN 4 INTERMEDIATE Intermediate     GENTAMICIN <=0.5 SENSITIVE  Sensitive     OXACILLIN >=4 RESISTANT Resistant     TETRACYCLINE <=1 SENSITIVE Sensitive     VANCOMYCIN 1 SENSITIVE Sensitive     TRIMETH/SULFA <=10 SENSITIVE Sensitive     CLINDAMYCIN <=0.25 SENSITIVE Sensitive     RIFAMPIN <=0.5 SENSITIVE Sensitive     Inducible Clindamycin NEGATIVE Sensitive     * ABUNDANT METHICILLIN RESISTANT STAPHYLOCOCCUS AUREUS    Impression/Plan:  1. 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  Continue with vancomycin for 6 weeks through March 4th.  Then likely continue with oral doxycycline. Will evaluate patient prior to pulling picc line I will arrange follow up in our clinic Baseline CRP 258, ESR 71 Has picc line Home health  getting arranged  2. Medication monitoring - labs per home health protocol  Thanks for consultation

## 2016-07-29 LAB — GLUCOSE, CAPILLARY
GLUCOSE-CAPILLARY: 140 mg/dL — AB (ref 65–99)
GLUCOSE-CAPILLARY: 102 mg/dL — AB (ref 65–99)
GLUCOSE-CAPILLARY: 129 mg/dL — AB (ref 65–99)
GLUCOSE-CAPILLARY: 101 mg/dL — AB (ref 65–99)

## 2016-07-29 LAB — BASIC METABOLIC PANEL
ANION GAP: 5 (ref 5–15)
BUN: 13 mg/dL (ref 6–20)
CALCIUM: 8.2 mg/dL — AB (ref 8.9–10.3)
CO2: 30 mmol/L (ref 22–32)
Chloride: 103 mmol/L (ref 101–111)
Creatinine, Ser: 0.89 mg/dL (ref 0.44–1.00)
GLUCOSE: 128 mg/dL — AB (ref 65–99)
POTASSIUM: 3.6 mmol/L (ref 3.5–5.1)
Sodium: 138 mmol/L (ref 135–145)

## 2016-07-29 NOTE — Plan of Care (Signed)
Problem: Safety: Goal: Ability to remain free from injury will improve Outcome: Progressing Safety precautions maintained  Problem: Activity: Goal: Risk for activity intolerance will decrease Outcome: Progressing OOB to [facility] with one assistance, she tolerated well  Problem: Bowel/Gastric: Goal: Will not experience complications related to bowel motility Outcome: Progressing No bowel issues reported  Problem: Activity: Goal: Will remain free from falls Outcome: Progressing No fall or injuries noted this shift  Problem: Physical Regulation: Goal: Postoperative complications will be avoided or minimized Outcome: Progressing No post op complications noted  Problem: Pain Management: Goal: Pain level will decrease with appropriate interventions Outcome: Progressing Medicated once for pain this shift, patient is resting in the bed with eyes closed at present time, no acute distress noted  Problem: Skin Integrity: Goal: Signs of wound healing will improve Outcome: Progressing No skin issues noted other than the left hip wound

## 2016-07-29 NOTE — Progress Notes (Signed)
   07/28/16 2208  PT Visit Information  Last PT Received On 07/28/16  Assistance Needed +1  History of Present Illness 61 yo female admitted on 06/01/16 for left THA direct anterior approach. PMH significant for HTN, Anxiety and Depression. She presented with acute left prosthetic hip infection for 1 week., s/p i&D and hip revision 07/25/16.  Subjective Data  Subjective Pt reports that she hopes this is her last surgery.   Patient Stated Goal return home.    Precautions  Precautions Fall  Restrictions  LLE Weight Bearing WBAT  Pain Assessment  Pain Assessment 0-10  Pain Score 5  Pain Location left hip  Pain Descriptors / Indicators Aching;Sore  Pain Intervention(s) Limited activity within patient's tolerance;Monitored during session;Patient requesting pain meds-RN notified;RN gave pain meds during session;Repositioned  Cognition  Arousal/Alertness Awake/alert  Behavior During Therapy [facility] for tasks assessed/performed  Overall Cognitive Status Within Functional Limits for tasks assessed  Bed Mobility  Overal bed mobility Needs Assistance  Bed Mobility Supine to Sit  Supine to sit Min assist  General bed mobility comments Min assist to progress left leg to EOB.    Transfers  Overall transfer level Needs assistance  Equipment used Rolling walker (2 wheeled)  Transfers Sit to/from Stand  Sit to Stand Min assist  Stand pivot transfers Min assist  General transfer comment Min assist to stand and pivot to [facility].  Verbal cues for safe hand placement.    Ambulation/Gait  Ambulation/Gait assistance Min assist  Ambulation Distance (Feet) 75 Feet  Assistive device Rolling walker (2 wheeled)  Gait Pattern/deviations Step-through pattern;Antalgic  General Gait Details Moderately antalgic gait pattern, RW lowered for better fit.  Min assist needed to support trunk during gait.    Gait velocity decreased  Balance  Overall balance assessment Needs assistance  Sitting-balance support Feet  supported;Bilateral upper extremity supported  Sitting balance-Leahy Scale Good  Standing balance support Bilateral upper extremity supported;Single extremity supported;No upper extremity supported  Standing balance-Leahy Scale Fair  Exercises  Exercises Total Joint  Total Joint Exercises  Ankle Circles/Pumps AROM;Both;20 reps  Quad Sets AROM;Left;10 reps  Heel Slides AAROM;Left;10 reps  Hip ABduction/ADduction AAROM;Left;10 reps  Long Arc Quad AROM;Both;10 reps  PT - End of Session  Activity Tolerance Patient limited by pain;Patient limited by fatigue  Patient left in chair;with call bell/phone within reach  PT - Assessment/Plan  PT Plan Current plan remains appropriate;Frequency needs to be updated  PT Frequency (ACUTE ONLY) 7X/week  Follow Up Recommendations Home health PT;Supervision - Intermittent  PT equipment None recommended by PT  PT Goal Progression  Progress towards PT goals Progressing toward goals  PT Time Calculation  PT Start Time (ACUTE ONLY) 1438  PT Stop Time (ACUTE ONLY) 1507  PT Time Calculation (min) (ACUTE ONLY) 29 min  PT General Charges  $$ ACUTE PT VISIT 1 Procedure  PT Treatments  $Gait Training 8-22 mins  $Therapeutic Exercise 8-22 mins  07/29/2016 late entry note from 1/26.  Pt is making slow, yet steady progress.  She is limited by fatigue and pain and requires most assist to move her leg to EOB.  She is still appropriate for HHPT f/u at discharge.  Barbarann Ehlers [place], [place], DPT [phone]

## 2016-07-29 NOTE — Plan of Care (Signed)
Problem: Bowel/Gastric: Goal: Will not experience complications related to bowel motility Outcome: Progressing Denies gastric and bowel issues  Problem: Activity: Goal: Will remain free from falls Outcome: Progressing Safety precautions and fall preventions maintained, no fall noted  Problem: Education: Goal: Knowledge of the prescribed therapeutic regimen will improve Outcome: Progressing SCD on, no S/S of DVT noted  Problem: Physical Regulation: Goal: Postoperative complications will be avoided or minimized Outcome: Progressing No complications noted  Problem: Pain Management: Goal: Pain level will decrease with appropriate interventions Outcome: Progressing Medicated with Oxycodone 10 mg po with moderate relief, resting in the bed with eyes closed at present time  Problem: Skin Integrity: Goal: Signs of wound healing will improve Outcome: Progressing Wound vac dressing intact, no other skin issues noted

## 2016-07-29 NOTE — Progress Notes (Signed)
Physical Therapy Treatment Patient Details Name: Kristin [name] MRN: [id number] DOB: [birth year] Today's Date: 07/29/2016    History of Present Illness 61 yo female admitted on 06/01/16 for left THA direct anterior approach. PMH significant for HTN, Anxiety and Depression. She presented with acute left prosthetic hip infection for 1 week., s/p i&D and hip revision 07/25/16.    PT Comments    Pt presents with improved tolerance for mobility this session. Performed LE strengthening exercises before gait. Improved sequencing and distance tolerance this session. Pt is making good progress toward goals.   Follow Up Recommendations  Home health PT;Supervision - Intermittent     Equipment Recommendations  None recommended by PT    Recommendations for Other Services       Precautions / Restrictions Precautions Precautions: Fall Restrictions Weight Bearing Restrictions: Yes LLE Weight Bearing: Weight bearing as tolerated    Mobility  Bed Mobility Overal bed mobility: Needs Assistance Bed Mobility: Supine to Sit     Supine to sit: Min assist     General bed mobility comments: Min assist to progress left leg to EOB.    Transfers Overall transfer level: Needs assistance Equipment used: Rolling walker (2 wheeled) Transfers: Sit to/from Stand Sit to Stand: Min assist         General transfer comment: Min A to stand from EOB. Verbal cues for hand placement  Ambulation/Gait Ambulation/Gait assistance: Min assist Ambulation Distance (Feet): 75 Feet Assistive device: Rolling walker (2 wheeled) Gait Pattern/deviations: Step-through pattern;Antalgic Gait velocity: decreased Gait velocity interpretation: Below normal speed for age/gender General Gait Details: Moderate antalgic gait. Improved sequencing noted this session.    Stairs            Wheelchair Mobility    Modified Rankin (Stroke Patients Only)       Balance Overall balance assessment: Needs  assistance Sitting-balance support: No upper extremity supported;Feet supported Sitting balance-Leahy Scale: Good     Standing balance support: Bilateral upper extremity supported;Single extremity supported;No upper extremity supported Standing balance-Leahy Scale: Poor Standing balance comment: relies on RW for stability in standing                    Cognition Arousal/Alertness: Awake/alert Behavior During Therapy: WFL for tasks assessed/performed Overall Cognitive Status: Within Functional Limits for tasks assessed                      Exercises Total Joint Exercises Ankle Circles/Pumps: AROM;Both;20 reps Quad Sets: AROM;Left;10 reps Heel Slides: AAROM;Left;10 reps Hip ABduction/ADduction: AAROM;Left;10 reps    General Comments        Pertinent Vitals/Pain Pain Assessment: 0-10 Pain Score: 5  Pain Location: left hip Pain Descriptors / Indicators: Aching;Sore Pain Intervention(s): Limited activity within patient's tolerance;Monitored during session;Premedicated before session;Ice applied    Home Living                      Prior Function            PT Goals (current goals can now be found in the care plan section) Acute Rehab PT Goals Patient Stated Goal: return home.   Progress towards PT goals: Progressing toward goals    Frequency    7X/week      PT Plan Current plan remains appropriate    Co-evaluation             End of Session Equipment Utilized During Treatment: Gait belt Activity Tolerance: Patient tolerated treatment well  Patient left: in chair;with call bell/phone within reach     Time: 0828-0853 PT Time Calculation (min) (ACUTE ONLY): 25 min  Charges:  $Gait Training: 8-22 mins $Therapeutic Exercise: 8-22 mins                    G Codes:      Scheryl Marten PT, DPT  [phone]  07/29/2016, 10:15 AM

## 2016-07-29 NOTE — Progress Notes (Signed)
Physical Therapy Treatment Patient Details Name: Kristin [name] MRN: [id number] DOB: [birth year] Today's Date: 07/29/2016    History of Present Illness 61 yo female admitted on 06/01/16 for left THA direct anterior approach. PMH significant for HTN, Anxiety and Depression. She presented with acute left prosthetic hip infection for 1 week., s/p i&D and hip revision 07/25/16.    PT Comments    Pt continues to be moving well with therapy this session but is limited by fatigue. Performed bed mobs and gait to bathroom with assistance to bring LE's into and out of bed. Left pt with handout with LE strengthening exercises and advised pt to continue performing exercises at least 2x daily on top of therapy session.    Follow Up Recommendations  Home health PT;Supervision - Intermittent     Equipment Recommendations  None recommended by PT    Recommendations for Other Services       Precautions / Restrictions Precautions Precautions: Fall Restrictions Weight Bearing Restrictions: Yes LLE Weight Bearing: Weight bearing as tolerated    Mobility  Bed Mobility Overal bed mobility: Needs Assistance Bed Mobility: Supine to Sit;Sit to Supine     Supine to sit: Min assist Sit to supine: Mod assist   General bed mobility comments: Min A to progress LLE EOB and Mod A to bring LLE into bed.   Transfers Overall transfer level: Needs assistance Equipment used: Rolling walker (2 wheeled) Transfers: Sit to/from Stand Sit to Stand: Min assist         General transfer comment: Min A to stand from EOB and commode. verbal cues for safe hand placement  Ambulation/Gait Ambulation/Gait assistance: Min assist Ambulation Distance (Feet): 20 Feet Assistive device: Rolling walker (2 wheeled) Gait Pattern/deviations: Step-through pattern;Antalgic Gait velocity: decreased Gait velocity interpretation: Below normal speed for age/gender General Gait Details: Moderate antalgic gait. Improved  sequencing noted this session.    Stairs            Wheelchair Mobility    Modified Rankin (Stroke Patients Only)       Balance Overall balance assessment: Needs assistance Sitting-balance support: No upper extremity supported;Feet supported Sitting balance-Leahy Scale: Good     Standing balance support: Bilateral upper extremity supported;Single extremity supported;No upper extremity supported Standing balance-Leahy Scale: Poor Standing balance comment: relies on RW for stability in standing                    Cognition Arousal/Alertness: Awake/alert Behavior During Therapy: WFL for tasks assessed/performed Overall Cognitive Status: Within Functional Limits for tasks assessed                      Exercises Total Joint Exercises Ankle Circles/Pumps: AROM;Both;20 reps Quad Sets: AROM;Left;10 reps Heel Slides: AAROM;Left;10 reps Hip ABduction/ADduction: AAROM;Left;10 reps    General Comments        Pertinent Vitals/Pain Pain Assessment: 0-10 Pain Score: 5  Pain Location: left hip Pain Descriptors / Indicators: Aching;Sore Pain Intervention(s): Limited activity within patient's tolerance;Monitored during session;Premedicated before session;Ice applied    Home Living                      Prior Function            PT Goals (current goals can now be found in the care plan section) Acute Rehab PT Goals Patient Stated Goal: return home.   Progress towards PT goals: Progressing toward goals    Frequency    7X/week  PT Plan Current plan remains appropriate    Co-evaluation             End of Session Equipment Utilized During Treatment: Gait belt Activity Tolerance: Patient tolerated treatment well Patient left: in bed;with call bell/phone within reach;with SCD's reapplied;with family/visitor present     Time: [id number] PT Time Calculation (min) (ACUTE ONLY): 27 min  Charges:  $Gait Training: 8-22  mins $Therapeutic Activity: 8-22 mins                    G Codes:      Scheryl Marten PT, DPT  [phone]  07/29/2016, 1:52 PM

## 2016-07-30 LAB — AEROBIC/ANAEROBIC CULTURE (SURGICAL/DEEP WOUND)

## 2016-07-30 LAB — GLUCOSE, CAPILLARY
GLUCOSE-CAPILLARY: 103 mg/dL — AB (ref 65–99)
Glucose-Capillary: 136 mg/dL — ABNORMAL HIGH (ref 65–99)
Glucose-Capillary: 118 mg/dL — ABNORMAL HIGH (ref 65–99)
Glucose-Capillary: 93 mg/dL (ref 65–99)

## 2016-07-30 LAB — AEROBIC/ANAEROBIC CULTURE W GRAM STAIN (SURGICAL/DEEP WOUND)

## 2016-07-30 LAB — VANCOMYCIN, TROUGH: VANCOMYCIN TR: 13 ug/mL — AB (ref 15–20)

## 2016-07-30 MED ORDER — VANCOMYCIN HCL IN DEXTROSE 1-5 GM/200ML-% IV SOLN
1000.0000 mg | Freq: Two times a day (BID) | INTRAVENOUS | Status: DC
  Administered 2016-07-30 – 2016-08-05 (×13): 1000 mg via INTRAVENOUS
  Filled 2016-07-30 (×14): qty 200

## 2016-07-30 NOTE — Progress Notes (Signed)
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  Now s/p I&D with polyexchange and head exchange, and his wound culture grew MRSA.  Pharmacy consulted to manage vancomycin. Today is Day #6.  Vancomycin trough remains slightly sub-therapeutic at 13 mcg/mL on 750mg  IV q12h. Renal function remains stable.   Plan:  - Increase vanc to 1000mg  IV Q12H - Monitor renal fxn, clinical progress, repeat vanc trough at [facility] on new dose   Height: 5\' 2"  (157.5 cm) Weight: 250 lb (113.4 kg) IBW/kg (Calculated) : 50.1  Temp (24hrs), Avg:98.4 F (36.9 C), Min:98.1 F (36.7 C), Max:98.5 F (36.9 C)   Recent Labs Lab 07/25/16 1506 07/26/16 0925 07/27/16 0616 07/28/16 0538 07/28/16 0901 07/29/16 0437 07/30/16 0605  WBC 17.3* 23.3* 24.1*  --  17.1*  --   --   CREATININE 0.95 0.97  --   --   --  0.89  --   VANCOTROUGH  --   --   --  9*  --   --  13*    Estimated Creatinine Clearance: 81 mL/min (by C-G formula based on SCr of 0.89 mg/dL).    Allergies  Allergen Reactions  . Fluconazole Swelling    REACTION: Face swelling  . Robitussin (Alcohol Free) [Guaifenesin] Palpitations    Chest pain    Antimicrobials this admission:  Zosyn 1/22 >> 1/24 Vanc 1/22 >> Ancef post-op 1/22  Dose adjustments this admission:  1/25 VT = 9 mcg/mL on 750mg  q12 (1/24 PM dose not charted as given) >> no change  Microbiology results:  1/22 L hip join fluid cx - MRSA    Thuy D. Mina Marble, PharmD, BCPS Pager:  [phone] 07/30/2016, 7:03 AM

## 2016-07-30 NOTE — Progress Notes (Signed)
   Subjective: 5 Days Post-Op Procedure(s) (LRB): LEFT HIP IRRIGATION AND DEBRIDEMENT, REVISION OF HEAD AND LINER (Left) Patient reports pain as moderate.    Objective: Vital signs in last 24 hours: Temp:  [98.1 F (36.7 C)-98.5 F (36.9 C)] 98.1 F (36.7 C) (01/27 0435) Pulse Rate:  [99-113] 101 (01/27 1023) Resp:  [18] 18 (01/26 1558) BP: (106-136)/(57-73) 114/69 (01/27 1023) SpO2:  [96 %-100 %] 96 % (01/27 0435)  Intake/Output from previous day: 01/26 0701 - 01/27 0700 In: 390 [P.O.:240; IV Piggyback:150] Out: 200 [Urine:200] Intake/Output this shift: No intake/output data recorded.   Recent Labs  07/27/16 2243 07/28/16 0901  HGB 9.6* 9.4*    Recent Labs  07/27/16 2243 07/28/16 0901  WBC  --  17.1*  RBC  --  3.12*  HCT 28.1* 28.1*  PLT  --  312    Recent Labs  07/29/16 0437  NA 138  K 3.6  CL 103  CO2 30  BUN 13  CREATININE 0.89  GLUCOSE 128*  CALCIUM 8.2*   No results for input(s): LABPT, INR in the last 72 hours.  Neurologically intact No results found.  Assessment/Plan: 5 Days Post-Op Procedure(s) (LRB): LEFT HIP IRRIGATION AND DEBRIDEMENT, REVISION OF HEAD AND LINER (Left) Up with therapy.   VAC production decreasing. Good seal.  ON IV VANC . Creatinine good.   Marybelle Killings 07/30/2016, 11:36 AM

## 2016-07-30 NOTE — Progress Notes (Signed)
Physical Therapy Treatment Patient Details Name: Kristin [name] MRN: [id number] DOB: January 23, 1956 Today's Date: 07/30/2016    History of Present Illness 61 yo female admitted on 06/01/16 for left THA direct anterior approach. PMH significant for HTN, Anxiety and Depression. She presented with acute left prosthetic hip infection for 1 week., s/p i&D and hip revision 07/25/16.    PT Comments    Pt continues to be moving well with therapy. Pt has increased tightness and swelling in left anterior thigh which limits some hip flexion mobility. Improved gait sequencing and distance tolerance noted this session. Pt has been adherent with exercises in HEP. Pt will need to perform stairs before discharge.    Follow Up Recommendations  Home health PT;Supervision - Intermittent     Equipment Recommendations  None recommended by PT    Recommendations for Other Services       Precautions / Restrictions Precautions Precautions: Fall Precaution Booklet Issued: No Restrictions Weight Bearing Restrictions: Yes LLE Weight Bearing: Weight bearing as tolerated    Mobility  Bed Mobility               General bed mobility comments: Pt OOB in recliner when PT arrives  Transfers Overall transfer level: Needs assistance Equipment used: Rolling walker (2 wheeled) Transfers: Sit to/from Stand Sit to Stand: Min guard         General transfer comment: Min guard for safety from recliner.   Ambulation/Gait Ambulation/Gait assistance: Min assist Ambulation Distance (Feet): 50 Feet Assistive device: Rolling walker (2 wheeled) Gait Pattern/deviations: Step-through pattern;Antalgic Gait velocity: decreased Gait velocity interpretation: Below normal speed for age/gender General Gait Details: Min A for equipment, min guard without. Moderate antalgic gait. Improved sequencing noted this session.    Stairs            Wheelchair Mobility    Modified Rankin (Stroke Patients Only)        Balance                                    Cognition Arousal/Alertness: Awake/alert Behavior During Therapy: WFL for tasks assessed/performed Overall Cognitive Status: Within Functional Limits for tasks assessed                      Exercises Total Joint Exercises Ankle Circles/Pumps: AROM;Both;20 reps Quad Sets: AROM;Left;10 reps Heel Slides: AAROM;Left;10 reps Hip ABduction/ADduction: AAROM;Left;10 reps Long Arc Quad: AROM;Both;10 reps    General Comments        Pertinent Vitals/Pain Pain Assessment: 0-10 Pain Score: 7  Pain Location: left hip with gait Pain Descriptors / Indicators: Aching;Sore Pain Intervention(s): Limited activity within patient's tolerance;Monitored during session;Premedicated before session;Ice applied    Home Living                      Prior Function            PT Goals (current goals can now be found in the care plan section) Acute Rehab PT Goals Patient Stated Goal: return home.   Progress towards PT goals: Progressing toward goals    Frequency    7X/week      PT Plan Current plan remains appropriate    Co-evaluation             End of Session Equipment Utilized During Treatment: Gait belt Activity Tolerance: Patient tolerated treatment well Patient left: in chair;with call bell/phone within  reach     Time: [id number] PT Time Calculation (min) (ACUTE ONLY): 29 min  Charges:  $Gait Training: 8-22 mins $Therapeutic Exercise: 8-22 mins                    G Codes:      Scheryl Marten PT, DPT  [phone]  07/30/2016, 11:04 AM

## 2016-07-30 NOTE — Progress Notes (Signed)
Physical Therapy Treatment Note  Pt demonstrates improved tolerance for gait distance this session increasing by 25'. Pt is able to perform gait and bathroom negotiation with min guard for safety in bathroom and min A due to equipment management with gait. Pt is making overall improvements in transfers. Will attempt stair negotiation tomorrow and teach pt how to get LLE OOB without assistance.     07/30/16 1404  PT Visit Information  Last PT Received On 07/30/16  Assistance Needed +1  History of Present Illness 61 yo female admitted on 06/01/16 for left THA direct anterior approach. PMH significant for HTN, Anxiety and Depression. She presented with acute left prosthetic hip infection for 1 week., s/p i&D and hip revision 07/25/16.  Subjective Data  Subjective Pt states that she is feeling well. No new complaints  Patient Stated Goal return home.    Precautions  Precautions Fall  Precaution Booklet Issued No  Restrictions  Weight Bearing Restrictions Yes  LLE Weight Bearing WBAT  Pain Assessment  Pain Assessment 0-10  Pain Score 5  Pain Location left hip with gait  Pain Descriptors / Indicators Aching;Sore  Pain Intervention(s) Monitored during session;RN gave pain meds during session;Ice applied  Cognition  Arousal/Alertness Awake/alert  Behavior During Therapy WFL for tasks assessed/performed  Overall Cognitive Status Within Functional Limits for tasks assessed  Bed Mobility  Overal bed mobility Needs Assistance  Bed Mobility Supine to Sit  Supine to sit Min assist  General bed mobility comments pt continues to require assistance to bring LLE EOB.   Transfers  Overall transfer level Needs assistance  Equipment used Rolling walker (2 wheeled)  Transfers Sit to/from Stand  Sit to Stand Min guard  General transfer comment Min guard for safety from EOB and commode  Ambulation/Gait  Ambulation/Gait assistance Min assist  Ambulation Distance (Feet) 75 Feet  Assistive device  Rolling walker (2 wheeled)  Gait Pattern/deviations Step-through pattern;Antalgic  General Gait Details Min A for equipment, min guard without. Moderate antalgic gait. Improved sequencing noted this session.   Gait velocity decreased  Gait velocity interpretation Below normal speed for age/gender  PT - End of Session  Equipment Utilized During Treatment Gait belt  Activity Tolerance Patient tolerated treatment well  Patient left in chair;with call bell/phone within reach;with family/visitor present  Nurse Communication Mobility status  PT - Assessment/Plan  PT Plan Current plan remains appropriate  PT Frequency (ACUTE ONLY) 7X/week  Follow Up Recommendations Home health PT;Supervision - Intermittent  PT equipment None recommended by PT  PT Goal Progression  Progress towards PT goals Progressing toward goals  PT Time Calculation  PT Start Time (ACUTE ONLY) 1336  PT Stop Time (ACUTE ONLY) 1403  PT Time Calculation (min) (ACUTE ONLY) 27 min  PT General Charges  $$ ACUTE PT VISIT 1 Procedure  PT Treatments  $Gait Training 8-22 mins  $Therapeutic Activity 8-22 mins   Scheryl Marten PT, DPT  [phone]

## 2016-07-31 LAB — GLUCOSE, CAPILLARY
GLUCOSE-CAPILLARY: 98 mg/dL (ref 65–99)
Glucose-Capillary: 116 mg/dL — ABNORMAL HIGH (ref 65–99)
Glucose-Capillary: 123 mg/dL — ABNORMAL HIGH (ref 65–99)
Glucose-Capillary: 107 mg/dL — ABNORMAL HIGH (ref 65–99)

## 2016-07-31 NOTE — Progress Notes (Signed)
Occupational Therapy Treatment Patient Details Name: Kristin [name] MRN: [id number] DOB: 23-Jul-1955 Today's Date: 07/31/2016    History of present illness 61 yo female admitted on 06/01/16 for left THA direct anterior approach. PMH significant for HTN, Anxiety and Depression. She presented with acute left prosthetic hip infection for 1 week., s/p i&D and hip revision 07/25/16.   OT comments  Pt. Making gains with skilled OT and is clear for d/c from OT.  Able to safely demonstrate ADLS and toileting tasks.  Reports husband available to assist with LB ADLS as needed.  Deferring tub transfer and will sponge bathe initially.  OTR/l to sign off. Pt. With no other questions or concerns.    Follow Up Recommendations  Home health OT;Supervision - Intermittent    Equipment Recommendations       Recommendations for Other Services      Precautions / Restrictions Precautions Precautions: Fall Precaution Booklet Issued: No Restrictions Weight Bearing Restrictions: Yes LLE Weight Bearing: Weight bearing as tolerated       Mobility Bed Mobility               General bed mobility comments: pt. was seated in recliner at beginning and end of session  Transfers Overall transfer level: Needs assistance Equipment used: Rolling walker (2 wheeled) Transfers: Sit to/from [facility] Sit to Stand: Supervision Stand pivot transfers: Supervision       General transfer comment: Min guard for safety from Recliner    Balance                                   ADL Overall ADL's : Needs assistance/impaired     Grooming: Wash/dry hands;Supervision/safety;Standing Grooming Details (indicate cue type and reason): cues for proper RW placement at counter       Lower Body Bathing Details (indicate cue type and reason): reports husband assists as needed       Lower Body Dressing Details (indicate cue type and reason): reports husband assists as  needed Toilet Transfer: Supervision/safety;Ambulation;Comfort height toilet;Grab bars;RW [employer] forces technical [employer] Details (indicate cue type and reason): uses b.room counter for UE support at home.  will not be using 3n1 over the commode at home [place] and Hygiene: Supervision/safety;Sit to/from stand     Tub/Shower Transfer Details (indicate cue type and reason): provided demo of tub transfer in b.room.  pt. reports R faucet and has a shower chair already.  educated on need for husband to be present to stabalize RW during transfer.  pt. declined simulated transfer stating she is "still a little nervous" about using her L leg.  reports she will sponge bathe initially until she feels stronger.  reviewed again not to attempt tub transfers without having a person with her.   Functional mobility during ADLs: Supervision/safety;Rolling walker        Vision                     Perception     Praxis      Cognition   Behavior During Therapy: WFL for tasks assessed/performed Overall Cognitive Status: Within Functional Limits for tasks assessed                       Extremity/Trunk Assessment               Exercises Total Joint Exercises Hip ABduction/ADduction: AAROM;Left;10 reps Long Arc Quad:  AROM;Both;10 reps Knee Flexion: AROM;Left;10 reps;Seated Marching in Standing: AROM;Left;10 reps;Standing   Shoulder Instructions       General Comments      Pertinent Vitals/ Pain       Pain Assessment: No/denies pain Pain Score: 4  Pain Location: left hip with gait Pain Descriptors / Indicators: Aching;Sore Pain Intervention(s): Monitored during session;Repositioned;Ice applied;Premedicated before session  Home Living                                          Prior Functioning/Environment              Frequency  Min 2X/week        Progress Toward Goals  OT Goals(current goals can now be found in the care plan  section)  Progress towards OT goals: Goals met/education completed, patient discharged from OT  Acute Rehab OT Goals Patient Stated Goal: return home.    Plan Discharge plan remains appropriate    Co-evaluation                 End of Session Equipment Utilized During Treatment: Gait belt;Rolling walker   Activity Tolerance Patient tolerated treatment well   Patient Left in chair;with call bell/phone within reach;with nursing/sitter in room   Nurse Communication Other (comment) (reviewed with RN pt. clear for d/c from OT)        Time: 7858-8502 OT Time Calculation (min): 19 min  Charges: OT General Charges $OT Visit: 1 Procedure OT Treatments $Self Care/Home Management : 8-22 mins  Janice Coffin, COTA/L 07/31/2016, 9:53 AM

## 2016-07-31 NOTE — Progress Notes (Signed)
Physical Therapy Treatment Note  Pt demonstrates improved tolerance for mobility. Lowered RW to reduce strain on shoulders and improved weight bearing through LLE. Pt continues to have limitations in hip flex and abd/add which may be due to increased swelling.     07/31/16 0837  PT Visit Information  Last PT Received On 07/31/16  Assistance Needed +1  History of Present Illness 61 yo female admitted on 06/01/16 for left THA direct anterior approach. PMH significant for HTN, Anxiety and Depression. She presented with acute left prosthetic hip infection for 1 week., s/p i&D and hip revision 07/25/16.  Subjective Data  Subjective Pt states that she is a little sleepy, but doing well.   Patient Stated Goal return home.    Precautions  Precautions Fall  Precaution Booklet Issued No  Restrictions  Weight Bearing Restrictions Yes  LLE Weight Bearing WBAT  Pain Assessment  Pain Assessment 0-10  Pain Score 4  Pain Location left hip with gait  Pain Descriptors / Indicators Aching;Sore  Pain Intervention(s) Monitored during session;Repositioned;Ice applied;Premedicated before session  Cognition  Arousal/Alertness Awake/alert  Behavior During Therapy WFL for tasks assessed/performed  Overall Cognitive Status Within Functional Limits for tasks assessed  Bed Mobility  General bed mobility comments OOB in recliner when PT arrives  Transfers  Overall transfer level Needs assistance  Equipment used Rolling walker (2 wheeled)  Transfers Sit to/from Stand  Sit to Stand Min guard  General transfer comment Min guard for safety from Recliner  Ambulation/Gait  Ambulation/Gait assistance Min assist  Ambulation Distance (Feet) 75 Feet  Assistive device Rolling walker (2 wheeled)  Gait Pattern/deviations Step-through pattern;Antalgic  General Gait Details Min A for equipment, min guard without. Moderate antalgic gait. Improved sequencing noted this session.   Gait velocity decreased  Gait velocity  interpretation Below normal speed for age/gender  Exercises  Exercises Total Joint  Total Joint Exercises  Hip ABduction/ADduction AAROM;Left;10 reps  Long Arc Quad AROM;Both;10 reps  Knee Flexion AROM;Left;10 reps;Seated  Marching in Standing AROM;Left;10 reps;Standing  PT - End of Session  Equipment Utilized During Treatment Gait belt  Activity Tolerance Patient tolerated treatment well  Patient left in chair;with call bell/phone within reach  Nurse Communication Mobility status  PT - Assessment/Plan  PT Plan Current plan remains appropriate  PT Frequency (ACUTE ONLY) 7X/week  Follow Up Recommendations Home health PT;Supervision - Intermittent  PT equipment None recommended by PT  PT Goal Progression  Progress towards PT goals Progressing toward goals  PT Time Calculation  PT Start Time (ACUTE ONLY) 0800  PT Stop Time (ACUTE ONLY) 0828  PT Time Calculation (min) (ACUTE ONLY) 28 min  PT General Charges  $$ ACUTE PT VISIT 1 Procedure  PT Treatments  $Gait Training 8-22 mins  $Therapeutic Exercise 8-22 mins   Scheryl Marten PT, DPT  [phone]

## 2016-07-31 NOTE — Progress Notes (Signed)
   Subjective: 6 Days Post-Op Procedure(s) (LRB): LEFT HIP IRRIGATION AND DEBRIDEMENT, REVISION OF HEAD AND LINER (Left) Patient reports pain as mild and moderate.    Objective: Vital signs in last 24 hours: Temp:  [97.9 F (36.6 C)-98 F (36.7 C)] 97.9 F (36.6 C) (01/28 0427) Pulse Rate:  [95-105] 105 (01/28 0934) Resp:  [18] 18 (01/28 0934) BP: (110-134)/(69-75) 134/69 (01/28 0934) SpO2:  [97 %-100 %] 100 % (01/28 0934)  Intake/Output from previous day: 01/27 0701 - 01/28 0700 In: 240 [P.O.:240] Out: 1025 [Urine:1000; Drains:25] Intake/Output this shift: No intake/output data recorded.  No results for input(s): HGB in the last 72 hours. No results for input(s): WBC, RBC, HCT, PLT in the last 72 hours.  Recent Labs  07/29/16 0437  NA 138  K 3.6  CL 103  CO2 30  BUN 13  CREATININE 0.89  GLUCOSE 128*  CALCIUM 8.2*   No results for input(s): LABPT, INR in the last 72 hours.  Neurologically intact  VAC intact.  No results found.  Assessment/Plan: 6 Days Post-Op Procedure(s) (LRB): LEFT HIP IRRIGATION AND DEBRIDEMENT, REVISION OF HEAD AND LINER (Left) Continue ABX therapy due to Post-op infection  Kristin [name] 07/31/2016, 10:30 AM

## 2016-07-31 NOTE — Progress Notes (Signed)
Physical Therapy Treatment Patient Details Name: Kristin [name] MRN: [id number] DOB: August 26, 1955 Today's Date: 07/31/2016    History of Present Illness 62 yo female admitted on 06/01/16 for left THA direct anterior approach. PMH significant for HTN, Anxiety and Depression. She presented with acute left prosthetic hip infection for 1 week., s/p i&D and hip revision 07/25/16.    PT Comments    Pt is making steady progress toward goals. Stair instruction completed today, pt may need verbal reinforcement at least of correct sequencing. Acute PT to continue during pt's hospital stay.  Follow Up Recommendations  Home health PT;Supervision - Intermittent     Equipment Recommendations  None recommended by PT    Recommendations for Other Services OT consult     Precautions / Restrictions Precautions Precautions: Fall Precaution Comments: direct anterior left hip, has VAC to left hip/thigh Restrictions LLE Weight Bearing: Weight bearing as tolerated    Mobility  Bed Mobility Overal bed mobility: Needs Assistance Bed Mobility: Supine to Sit;Sit to Supine     Supine to sit: Min assist Sit to supine: Min assist   General bed mobility comments: bed flat: used belt to move left leg to edge of bed on right with supine to sitting, used right LE to attempt to left left leg back onto bed with lying back down. Pt able to use belt to move leg without assistance, could not use sound leg to move right- needed assistance. pt able to self elevate and lower trunk. needs additional practice using belt or sheet to self move left leg around/into/out of bed.  Transfers Overall transfer level: Needs assistance Equipment used: Rolling walker (2 wheeled) Transfers: Sit to/from Stand Sit to Stand: Supervision Stand pivot transfers: Supervision       General transfer comment: demo'd safe technique from bed/chair/toilet and to chair/toilet/bed. supervision for stand pivot transfer bed to recliner to  roll to stairs.   Ambulation/Gait Ambulation/Gait assistance: Supervision Ambulation Distance (Feet): 15 Feet (x2) Assistive device: Rolling walker (2 wheeled) Gait Pattern/deviations: Step-through pattern;Antalgic   Gait velocity interpretation: Below normal speed for age/gender General Gait Details: supervision due to multiple lines/equipment.    Stairs Stairs: Yes   Stair Management: No rails;Step to pattern;Backwards;With walker Number of Stairs: 2 General stair comments: demo'd technique with walker prior to pt performance. min assist for walker stability with stair negotiation with reminder cues on correct sequencing. Handout given as well. did not attempt cane as goal is set for due to pt's left hip pain and heavy reliance on walker with mobility at this time.       Cognition Arousal/Alertness: Awake/alert Behavior During Therapy: WFL for tasks assessed/performed Overall Cognitive Status: Within Functional Limits for tasks assessed           Pertinent Vitals/Pain Pain Assessment: 0-10 Pain Score: 4  Pain Location: left hip Pain Descriptors / Indicators: Aching;Sore Pain Intervention(s): Limited activity within patient's tolerance;Monitored during session;Premedicated before session;Repositioned;Ice applied     PT Goals (current goals can now be found in the care plan section) Acute Rehab PT Goals Patient Stated Goal: return home.   PT Goal Formulation: With patient Time For Goal Achievement: 08/05/16 Potential to Achieve Goals: Good Progress towards PT goals: Progressing toward goals    Frequency    7X/week      PT Plan Current plan remains appropriate    End of Session Equipment Utilized During Treatment: Gait belt Activity Tolerance: Patient tolerated treatment well;No increased pain Patient left: in bed;with call bell/phone within  reach;with family/visitor present     Time: [id number] PT Time Calculation (min) (ACUTE ONLY): 39 min  Charges:   $Gait Training: 23-37 mins $Therapeutic Activity: 8-22 mins            Willow Ora 07/31/2016, 2:54 PM   Willow Ora, PTA, CLT Acute Rehab Services [phone] 07/31/16, 2:56 PM

## 2016-07-31 NOTE — Plan of Care (Signed)
Problem: Acute Rehab OT Goals (only OT should resolve) Goal: Pt. Will Perform Lower Body Bathing Outcome: Not Applicable Date Met: 19/01/22 Reports husband to assist as needed Goal: Pt. Will Perform Lower Body Dressing Outcome: Not Applicable Date Met: 24/11/46 Reports husband available to assist as needed Goal: Pt. Will Perform Tub/Shower Transfer Outcome: Not Met (add Reason) Pt. States she plans on sponge bathing initially

## 2016-08-01 LAB — BASIC METABOLIC PANEL
ANION GAP: 6 (ref 5–15)
BUN: 8 mg/dL (ref 6–20)
CALCIUM: 8.9 mg/dL (ref 8.9–10.3)
CO2: 33 mmol/L — AB (ref 22–32)
Chloride: 96 mmol/L — ABNORMAL LOW (ref 101–111)
Creatinine, Ser: 0.93 mg/dL (ref 0.44–1.00)
Glucose, Bld: 117 mg/dL — ABNORMAL HIGH (ref 65–99)
POTASSIUM: 3.7 mmol/L (ref 3.5–5.1)
Sodium: 135 mmol/L (ref 135–145)

## 2016-08-01 LAB — GLUCOSE, CAPILLARY
GLUCOSE-CAPILLARY: 147 mg/dL — AB (ref 65–99)
GLUCOSE-CAPILLARY: 117 mg/dL — AB (ref 65–99)
GLUCOSE-CAPILLARY: 96 mg/dL (ref 65–99)
Glucose-Capillary: 112 mg/dL — ABNORMAL HIGH (ref 65–99)

## 2016-08-01 LAB — VANCOMYCIN, TROUGH: Vancomycin Tr: 17 ug/mL (ref 15–20)

## 2016-08-01 NOTE — Progress Notes (Signed)
Physical Therapy Treatment Patient Details Name: Kristin [name] MRN: [id number] DOB: 09/29/1955 Today's Date: 08/01/2016    History of Present Illness 61 yo female admitted on 06/01/16 for left THA direct anterior approach. PMH significant for HTN, Anxiety and Depression. She presented with acute left prosthetic hip infection for 1 week., s/p i&D and hip revision 07/25/16.    PT Comments    Pt is progressing well with mobility.  I cleared her to be up around her room unassisted as she is not currently attached to her IV or her wound vac and has gotten up three times today unassisted.  She was able to progress her gait distance a bit more this afternoon and she is showing signs of increased strength in her left leg during exercises.  She would benefit from practicing bed mobility with HOB flat and no rail next session simulating home a bit more.  PT will continue to follow acutely.   Follow Up Recommendations  Home health PT;Supervision - Intermittent     Equipment Recommendations  None recommended by PT    Recommendations for Other Services   NA     Precautions / Restrictions Precautions Precautions: None Precaution Booklet Issued: No Precaution Comments: Pt reports she still has her HEP handout from her original surgery.  Required Braces or Orthoses: Other Brace/Splint Other Brace/Splint: wound vac d/c  Restrictions LLE Weight Bearing: Weight bearing as tolerated    Mobility  Bed Mobility Overal bed mobility: Modified Independent       Supine to sit: Modified independent (Device/Increase time)     General bed mobility comments: Pt able to get EOB without external assist from PT. using bil hands to help move her left leg to EOB.  HOB elevated and pt also using railing for assist.  May start to decreased [facility] and take away railing next session to simulate home more.   Transfers Overall transfer level: Modified independent Equipment used: Rolling walker (2  wheeled) Transfers: Sit to/from Stand Sit to Stand: Modified independent (Device/Increase time)         General transfer comment: Pt demonstrated safe sit to stand with proper hand placement.  No external assist needed.  Some grimacing with transitions and heavy reliance on hands for control during transitions.   Ambulation/Gait Ambulation/Gait assistance: Supervision Ambulation Distance (Feet): 100 Feet Assistive device: Rolling walker (2 wheeled) Gait Pattern/deviations: Step-through pattern;Ataxic;Trunk flexed Gait velocity: decreased Gait velocity interpretation: Below normal speed for age/gender General Gait Details: Pt with moderately antalgic gait pattern, heavy reliance on hands to relieve pressure on left leg during stance.  Verbal cues for upright posture.    Stairs Stairs: Yes       General stair comments: Verbally reviewed LE sequencing without physically practicing the stairs.  Pt needed reinforcement of correct technique.  It would be good to physically practice them again.          Balance Overall balance assessment: Needs assistance Sitting-balance support: Feet supported;No upper extremity supported Sitting balance-Leahy Scale: Good     Standing balance support: Bilateral upper extremity supported;Single extremity supported;No upper extremity supported Standing balance-Leahy Scale: Fair                      Cognition Arousal/Alertness: Awake/alert Behavior During Therapy: WFL for tasks assessed/performed Overall Cognitive Status: Within Functional Limits for tasks assessed                      Exercises Total Joint Exercises  Ankle Circles/Pumps: AROM;Both;20 reps Quad Sets: AROM;Left;10 reps Heel Slides: AAROM;Left;10 reps Hip ABduction/ADduction: AAROM;Left;10 reps Long Arc Quad: AROM;Both;10 reps;Seated Knee Flexion: AROM;Left;10 reps;Standing Marching in Standing: AROM;Left;10 reps;Standing Standing Hip Extension: AROM;Left;10  reps;Standing        Pertinent Vitals/Pain Pain Assessment: 0-10 Pain Score: 7  Pain Location: left thigh Pain Descriptors / Indicators: Aching;Sore Pain Intervention(s): Limited activity within patient's tolerance;Monitored during session;Repositioned;Ice applied           PT Goals (current goals can now be found in the care plan section) Acute Rehab PT Goals Patient Stated Goal: return home.   Progress towards PT goals: Progressing toward goals    Frequency    7X/week      PT Plan Current plan remains appropriate       End of Session   Activity Tolerance: Patient limited by pain Patient left: in chair;with call bell/phone within reach     Time: 4098-1191 PT Time Calculation (min) (ACUTE ONLY): 24 min  Charges:  $Gait Training: 8-22 mins $Therapeutic Exercise: 8-22 mins                      Esthefany Herrig B. Brooklynne Pereida, PT, DPT [phone]   08/01/2016, 4:13 PM

## 2016-08-01 NOTE — Care Management (Signed)
Case manager continuing to monitor patient for discharge planning. Per MD note, patient may be returning to Surgery for repeat I & D. Ricki Miller, RN BSN Case manager [phone]

## 2016-08-01 NOTE — Progress Notes (Signed)
Physical Therapy Treatment Patient Details Name: Kristin [name] MRN: [id number] DOB: 1955-07-05 Today's Date: 08/01/2016    History of Present Illness 61 yo female admitted on 06/01/16 for left THA direct anterior approach. PMH significant for HTN, Anxiety and Depression. She presented with acute left prosthetic hip infection for 1 week., s/p i&D and hip revision 07/25/16.    PT Comments    Pt is progressing well with her mobility.  She is moving around the room on her own, however, she continues to have decreased endurance with limited gait distance progression.  She also needs reinforcement of stair training.  Standing HEP exercises reviewed and pt did well with those today.  PT will continue to follow acutely.   Follow Up Recommendations  Home health PT;Supervision - Intermittent     Equipment Recommendations  None recommended by PT    Recommendations for Other Services   NA     Precautions / Restrictions Precautions Precautions: None Precaution Booklet Issued: No Precaution Comments: Pt reports she still has her HEP handout from her original surgery.  Required Braces or Orthoses: Other Brace/Splint Other Brace/Splint: wound vac d/c  Restrictions LLE Weight Bearing: Weight bearing as tolerated    Mobility  Bed Mobility               General bed mobility comments: Pt is OOB in the recliner chair.  She has been taking herself to the bathroom.   Transfers Overall transfer level: Modified independent Equipment used: Rolling walker (2 wheeled) Transfers: Sit to/from Stand Sit to Stand: Modified independent (Device/Increase time)         General transfer comment: Pt demonstrated safe sit to stand with proper hand placement.  No external assist needed.  Some grimacing with transitions.   Ambulation/Gait Ambulation/Gait assistance: Supervision Ambulation Distance (Feet): 75 Feet Assistive device: Rolling walker (2 wheeled) Gait Pattern/deviations: Step-through  pattern;Ataxic;Trunk flexed Gait velocity: decreased Gait velocity interpretation: Below normal speed for age/gender General Gait Details: Pt with moderately antalgic gait pattern, heavy reliance on hands to relieve pressure on left leg during stance.  Verbal cues for upright posture.    Stairs Stairs: Yes       General stair comments: Verbally reviewed LE sequencing without physically practicing the stairs.  Pt needed reinforcement of correct technique.  It would be good to physically practice them again.          Balance Overall balance assessment: Needs assistance Sitting-balance support: Feet supported;No upper extremity supported Sitting balance-Leahy Scale: Good     Standing balance support: Bilateral upper extremity supported;Single extremity supported;No upper extremity supported Standing balance-Leahy Scale: Fair                      Cognition Arousal/Alertness: Awake/alert Behavior During Therapy: WFL for tasks assessed/performed Overall Cognitive Status: Within Functional Limits for tasks assessed                      Exercises Total Joint Exercises Hip ABduction/ADduction: AROM;Left;10 reps;Standing Knee Flexion: AROM;Left;10 reps;Standing Marching in Standing: AROM;Left;10 reps;Standing Standing Hip Extension: AROM;Left;10 reps;Standing    General Comments        Pertinent Vitals/Pain Pain Assessment: 0-10 Pain Score: 6  Pain Location: left thigh Pain Descriptors / Indicators: Aching;Sore Pain Intervention(s): Limited activity within patient's tolerance;Monitored during session;Repositioned;Ice applied           PT Goals (current goals can now be found in the care plan section) Acute Rehab PT Goals Patient  Stated Goal: return home.   Progress towards PT goals: Progressing toward goals    Frequency    7X/week      PT Plan Current plan remains appropriate       End of Session   Activity Tolerance: Patient limited by  pain Patient left: in chair;with call bell/phone within reach     Time: 1158-1220 PT Time Calculation (min) (ACUTE ONLY): 22 min  Charges:  $Gait Training: 8-22 mins                      Shanette Tamargo B. Jeffrey Graefe, PT, DPT [phone]   08/01/2016, 12:33 PM

## 2016-08-01 NOTE — Progress Notes (Signed)
   Subjective:  Patient reports pain as mild.  No events.  Objective:   VITALS:   Vitals:   07/31/16 0934 07/31/16 1442 07/31/16 2120 08/01/16 0515  BP: 134/69 114/63 111/61 106/63  Pulse: (!) 105 80 100 93  Resp: 18 18  18   Temp:  97.9 F (36.6 C) 98.8 F (37.1 C) 98.7 F (37.1 C)  TempSrc:  Oral Oral Oral  SpO2: 100% 100% 100% 99%  Weight:      Height:        Continues to have serosanguinous drainage from incision Swelling and induration improved No cellulitis   Lab Results  Component Value Date   WBC 17.1 (H) 07/28/2016   HGB 9.4 (L) 07/28/2016   HCT 28.1 (L) 07/28/2016   MCV 90.1 07/28/2016   PLT 312 07/28/2016     Assessment/Plan:  7 Days Post-Op   - Expected postop acute blood loss anemia - will monitor for symptoms - iVAC removed and ABD pads applied - will monitor drainage, may consider repeat I&D of left hip - continue abx  Eduard Roux 08/01/2016, 7:56 AM [phone]

## 2016-08-01 NOTE — Progress Notes (Signed)
Pharmacy Antibiotic Note  Kristin [name] is a 4 YOF with history of THR on 06/01/16 and presented with left hip pain and swelling- wound culture grew MRSA.  Pharmacy consulted to manage vancomycin. Today is Day #6.  Vancomycin trough therapeutic this morning at 17mcg/mL on 1000mg  IV q12h. Renal function remains stable with SCr 0.9.   Plan:  - Continue vancomycin at 1000mg  IV Q12H - Monitor renal fxn, clinical progress, LOT   Height: 5\' 2"  (157.5 cm) Weight: 250 lb (113.4 kg) IBW/kg (Calculated) : 50.1  Temp (24hrs), Avg:98.5 F (36.9 C), Min:97.9 F (36.6 C), Max:98.8 F (37.1 C)   Recent Labs Lab 07/25/16 1506 07/26/16 0925 07/27/16 0616  07/28/16 0901 07/29/16 0437 07/30/16 0605 08/01/16 0430  WBC 17.3* 23.3* 24.1*  --  17.1*  --   --   --   CREATININE 0.95 0.97  --   --   --  0.89  --  0.93  VANCOTROUGH  --   --   --   < >  --   --  13* 17  < > = values in this interval not displayed.  Estimated Creatinine Clearance: 77.5 mL/min (by C-G formula based on SCr of 0.93 mg/dL).    Allergies  Allergen Reactions  . Fluconazole Swelling    REACTION: Face swelling  . Robitussin (Alcohol Free) [Guaifenesin] Palpitations    Chest pain    Antimicrobials this admission:  Zosyn 1/22 >> 1/24 Vanc 1/22 >> Ancef post-op 1/22  Dose adjustments this admission:  1/25 VT = 9 mcg/mL on 750mg  q12 (1/24 PM dose not charted as given) >> no change 1/29 VT = 17 on 1g IV q12h  Microbiology results:  1/22 L hip join fluid cx - MRSA   Shakea Isip D. Serenity Fortner, PharmD, BCPS Clinical Pharmacist Pager: [phone] 08/01/2016 5:49 AM

## 2016-08-02 LAB — GLUCOSE, CAPILLARY
GLUCOSE-CAPILLARY: 101 mg/dL — AB (ref 65–99)
Glucose-Capillary: 104 mg/dL — ABNORMAL HIGH (ref 65–99)
Glucose-Capillary: 100 mg/dL — ABNORMAL HIGH (ref 65–99)

## 2016-08-02 NOTE — Progress Notes (Signed)
Continues to have drainage therefore will take patient back for repeat I&D and exploration of wound.  NPO after midnight.

## 2016-08-02 NOTE — Progress Notes (Signed)
   Subjective:  Patient reports pain as mild.  No events.  Objective:   VITALS:   Vitals:   08/01/16 0515 08/01/16 1300 08/01/16 2128 08/02/16 0500  BP: 106/63 107/64 130/63 129/70  Pulse: 93 (!) 102 (!) 111 94  Resp: 18 18 18 18   Temp: 98.7 F (37.1 C) 98.4 F (36.9 C) 99.3 F (37.4 C) 97.9 F (36.6 C)  TempSrc: Oral Oral Oral Oral  SpO2: 99% 92% 99% 100%  Weight:      Height:        Continues to have serosanguinous drainage from incision Swelling and induration improved No cellulitis   Lab Results  Component Value Date   WBC 17.1 (H) 07/28/2016   HGB 9.4 (L) 07/28/2016   HCT 28.1 (L) 07/28/2016   MCV 90.1 07/28/2016   PLT 312 07/28/2016     Assessment/Plan:  8 Days Post-Op   - drainage improved - anticipate dc home this pm if drainage doesn't worsen - continue vanc  Eduard Roux 08/02/2016, 8:25 AM [phone]

## 2016-08-02 NOTE — Progress Notes (Signed)
Physical Therapy Treatment Patient Details Name: Kristin [name] MRN: [id number] DOB: 1956/01/16 Today's Date: 08/02/2016    History of Present Illness 61 yo female admitted on 06/01/16 for left THA direct anterior approach. PMH significant for HTN, Anxiety and Depression. She presented with acute left prosthetic hip infection for 1 week., s/p i&D and hip revision 07/25/16.    PT Comments    Pt is progressing well with mobility.  Was able to demonstrate stairs with min assist simulating home entry.  She reports her husband will be there to assist her to get into and out of the house.  She is safe to d/c home from a mobility standpoint.  PT to follow acutely until d/c confirmed.     Follow Up Recommendations  Home health PT;Supervision - Intermittent     Equipment Recommendations  None recommended by PT    Recommendations for Other Services   NA     Precautions / Restrictions Precautions Precautions: None Precaution Comments: Pt reports she still has her HEP handout from her original surgery.  Other Brace/Splint: wound vac d/c  Restrictions LLE Weight Bearing: Weight bearing as tolerated    Mobility  Bed Mobility               General bed mobility comments: Pt OOB in the recliner chair  Transfers Overall transfer level: Modified independent Equipment used: Rolling walker (2 wheeled) Transfers: Sit to/from Stand Sit to Stand: Modified independent (Device/Increase time)         General transfer comment: Pt continues to demonstrate safe hand placement and transitions using support surface to push up and control descent.    Ambulation/Gait Ambulation/Gait assistance: Supervision Ambulation Distance (Feet): 100 Feet Assistive device: Rolling walker (2 wheeled) Gait Pattern/deviations: Step-through pattern;Antalgic Gait velocity: decreased Gait velocity interpretation: Below normal speed for age/gender General Gait Details: Minimally antalgic gait pattern, safe RW  use.    Stairs Stairs: Yes   Stair Management: No rails;Step to pattern;Forwards;With walker Number of Stairs: 3 General stair comments: Verbal cues for correct LE sequencing.  Pt going forwards with RW at home on stairs (smaller and deeper than our stairs) and husband's assist.          Balance Overall balance assessment: Needs assistance Sitting-balance support: Feet supported;No upper extremity supported Sitting balance-Leahy Scale: Good     Standing balance support: Bilateral upper extremity supported;Single extremity supported;No upper extremity supported Standing balance-Leahy Scale: Fair                      Cognition Arousal/Alertness: Awake/alert Behavior During Therapy: WFL for tasks assessed/performed Overall Cognitive Status: Within Functional Limits for tasks assessed                      Exercises Total Joint Exercises Short Arc Quad: AROM;Left;10 reps Hip ABduction/ADduction: AAROM;Left;10 reps Long Arc Quad: AROM;Left;10 reps        Pertinent Vitals/Pain Pain Assessment: 0-10 Pain Score: 4  Pain Location: left thigh Pain Descriptors / Indicators: Aching;Sore Pain Intervention(s): Limited activity within patient's tolerance;Monitored during session;Repositioned;Ice applied           PT Goals (current goals can now be found in the care plan section) Acute Rehab PT Goals Patient Stated Goal: return home.   Progress towards PT goals: Progressing toward goals    Frequency    7X/week      PT Plan Current plan remains appropriate       End of Session  Activity Tolerance: Patient limited by pain Patient left: in chair;with call bell/phone within reach     Time: 1140-1152 PT Time Calculation (min) (ACUTE ONLY): 12 min  Charges:  $Gait Training: 8-22 mins                      Jayshaun Phillips B. Wolfe Camarena, PT, DPT [phone]   08/02/2016, 12:05 PM

## 2016-08-03 ENCOUNTER — Encounter (HOSPITAL_COMMUNITY)
Admission: RE | Disposition: A | Discharge status: Home Health | Payer: Self-pay | Source: Ambulatory Visit | Attending: Orthopaedic Surgery

## 2016-08-03 ENCOUNTER — Encounter (HOSPITAL_COMMUNITY): Payer: Self-pay | Admitting: Certified Registered Nurse Anesthetist

## 2016-08-03 ENCOUNTER — Inpatient Hospital Stay (HOSPITAL_COMMUNITY): Payer: Self-pay | Admitting: Anesthesiology

## 2016-08-03 HISTORY — PX: INCISION AND DRAINAGE HIP: SHX1801

## 2016-08-03 LAB — BASIC METABOLIC PANEL
Anion gap: 9 (ref 5–15)
BUN: 9 mg/dL (ref 6–20)
CALCIUM: 8.9 mg/dL (ref 8.9–10.3)
CO2: 30 mmol/L (ref 22–32)
CREATININE: 0.89 mg/dL (ref 0.44–1.00)
Chloride: 96 mmol/L — ABNORMAL LOW (ref 101–111)
GFR calc Af Amer: >60 mL/min (ref >60)
GLUCOSE: 100 mg/dL — AB (ref 65–99)
Potassium: 3.4 mmol/L — ABNORMAL LOW (ref 3.5–5.1)
SODIUM: 135 mmol/L (ref 135–145)

## 2016-08-03 LAB — GLUCOSE, CAPILLARY
GLUCOSE-CAPILLARY: 111 mg/dL — AB (ref 65–99)
GLUCOSE-CAPILLARY: 97 mg/dL (ref 65–99)
Glucose-Capillary: 108 mg/dL — ABNORMAL HIGH (ref 65–99)
Glucose-Capillary: 119 mg/dL — ABNORMAL HIGH (ref 65–99)

## 2016-08-03 SURGERY — IRRIGATION AND DEBRIDEMENT HIP
Anesthesia: General | Site: Hip | Laterality: Left

## 2016-08-03 MED ORDER — ARTIFICIAL TEARS OP OINT
TOPICAL_OINTMENT | OPHTHALMIC | Status: DC | PRN
  Administered 2016-08-03: 1 via OPHTHALMIC

## 2016-08-03 MED ORDER — FENTANYL CITRATE (PF) 100 MCG/2ML IJ SOLN
INTRAMUSCULAR | Status: DC | PRN
Start: 2016-08-03 — End: 2016-08-03
  Administered 2016-08-03: 100 ug via INTRAVENOUS

## 2016-08-03 MED ORDER — SUCCINYLCHOLINE CHLORIDE 20 MG/ML IJ SOLN
INTRAMUSCULAR | Status: DC | PRN
  Administered 2016-08-03: 120 mg via INTRAVENOUS

## 2016-08-03 MED ORDER — PHENYLEPHRINE HCL 10 MG/ML IJ SOLN
INTRAMUSCULAR | Status: DC | PRN
  Administered 2016-08-03 (×2): 120 ug via INTRAVENOUS
  Administered 2016-08-03 (×2): 160 ug via INTRAVENOUS

## 2016-08-03 MED ORDER — LIDOCAINE HCL (CARDIAC) 20 MG/ML IV SOLN
INTRAVENOUS | Status: DC | PRN
  Administered 2016-08-03: 60 mg via INTRAVENOUS

## 2016-08-03 MED ORDER — GLYCOPYRROLATE 0.2 MG/ML IJ SOLN
INTRAMUSCULAR | Status: DC | PRN
  Administered 2016-08-03: 0.2 mg via INTRAVENOUS

## 2016-08-03 MED ORDER — FENTANYL CITRATE (PF) 100 MCG/2ML IJ SOLN
25.0000 ug | INTRAMUSCULAR | Status: DC | PRN
  Administered 2016-08-03 (×2): 50 ug via INTRAVENOUS

## 2016-08-03 MED ORDER — METOPROLOL TARTRATE 5 MG/5ML IV SOLN
INTRAVENOUS | Status: AC
  Filled 2016-08-03: qty 5

## 2016-08-03 MED ORDER — LIDOCAINE 2% (20 MG/ML) 5 ML SYRINGE
INTRAMUSCULAR | Status: AC
  Filled 2016-08-03: qty 5

## 2016-08-03 MED ORDER — PROPOFOL 10 MG/ML IV BOLUS
INTRAVENOUS | Status: DC | PRN
  Administered 2016-08-03: 200 mg via INTRAVENOUS

## 2016-08-03 MED ORDER — ARTIFICIAL TEARS OP OINT
TOPICAL_OINTMENT | OPHTHALMIC | Status: AC
  Filled 2016-08-03: qty 3.5

## 2016-08-03 MED ORDER — PHENYLEPHRINE 40 MCG/ML (10ML) SYRINGE FOR IV PUSH (FOR BLOOD PRESSURE SUPPORT)
PREFILLED_SYRINGE | INTRAVENOUS | Status: AC
  Filled 2016-08-03: qty 20

## 2016-08-03 MED ORDER — LACTATED RINGERS IV SOLN
INTRAVENOUS | Status: DC
  Administered 2016-08-03: 12:00:00 via INTRAVENOUS

## 2016-08-03 MED ORDER — MIDAZOLAM HCL 5 MG/5ML IJ SOLN
INTRAMUSCULAR | Status: DC | PRN
  Administered 2016-08-03 (×2): 1 mg via INTRAVENOUS

## 2016-08-03 MED ORDER — VANCOMYCIN HCL IN DEXTROSE 1-5 GM/200ML-% IV SOLN
1000.0000 mg | Freq: Two times a day (BID) | INTRAVENOUS | 42 refills | Status: DC
Start: 2016-08-03 — End: 2016-09-08

## 2016-08-03 MED ORDER — FENTANYL CITRATE (PF) 100 MCG/2ML IJ SOLN
INTRAMUSCULAR | Status: AC
  Filled 2016-08-03: qty 2

## 2016-08-03 MED ORDER — SUCCINYLCHOLINE CHLORIDE 200 MG/10ML IV SOSY
PREFILLED_SYRINGE | INTRAVENOUS | Status: AC
  Filled 2016-08-03: qty 10

## 2016-08-03 MED ORDER — MIDAZOLAM HCL 2 MG/2ML IJ SOLN
INTRAMUSCULAR | Status: AC
  Filled 2016-08-03: qty 2

## 2016-08-03 MED ORDER — FENTANYL CITRATE (PF) 100 MCG/2ML IJ SOLN
INTRAMUSCULAR | Status: AC
  Administered 2016-08-03: 50 ug via INTRAVENOUS
  Filled 2016-08-03: qty 2

## 2016-08-03 MED ORDER — SODIUM CHLORIDE 0.9 % IR SOLN
Status: DC | PRN
  Administered 2016-08-03: 3000 mL

## 2016-08-03 MED ORDER — LACTATED RINGERS IV SOLN
INTRAVENOUS | Status: DC | PRN
  Administered 2016-08-03 (×2): via INTRAVENOUS

## 2016-08-03 MED ORDER — PROPOFOL 10 MG/ML IV BOLUS
INTRAVENOUS | Status: AC
  Filled 2016-08-03: qty 20

## 2016-08-03 MED ORDER — VANCOMYCIN IV (FOR PTA / DISCHARGE USE ONLY): 1000.0000 mg | Freq: Two times a day (BID) | INTRAVENOUS | 0 refills | Status: DC

## 2016-08-03 MED ORDER — SENNOSIDES-DOCUSATE SODIUM 8.6-50 MG PO TABS: 1.0000 | ORAL_TABLET | Freq: Every evening | ORAL | 1 refills | Status: DC | PRN

## 2016-08-03 MED ORDER — PROMETHAZINE HCL 25 MG PO TABS: 25.0000 mg | ORAL_TABLET | Freq: Four times a day (QID) | ORAL | 1 refills | Status: DC | PRN

## 2016-08-03 MED ORDER — METOPROLOL TARTARATE 1 MG/ML SYRINGE (5ML)
Status: DC | PRN
  Administered 2016-08-03: 2.5 mg via INTRAVENOUS

## 2016-08-03 MED ORDER — OXYCODONE-ACETAMINOPHEN 5-325 MG PO TABS: 1.0000 | ORAL_TABLET | ORAL | 0 refills | Status: DC | PRN

## 2016-08-03 MED ORDER — ONDANSETRON HCL 4 MG/2ML IJ SOLN
INTRAMUSCULAR | Status: AC
  Filled 2016-08-03: qty 2

## 2016-08-03 MED ORDER — ROCURONIUM BROMIDE 50 MG/5ML IV SOSY
PREFILLED_SYRINGE | INTRAVENOUS | Status: AC
  Filled 2016-08-03: qty 5

## 2016-08-03 MED ORDER — ONDANSETRON HCL 4 MG/2ML IJ SOLN
INTRAMUSCULAR | Status: DC | PRN
  Administered 2016-08-03: 4 mg via INTRAVENOUS

## 2016-08-03 SURGICAL SUPPLY — 25 items
CANISTER WOUND CARE 500ML ATS (WOUND CARE) ×4 IMPLANT
COVER SURGICAL LIGHT HANDLE (MISCELLANEOUS) ×3 IMPLANT
DRAPE IMP U-DRAPE 54X76 (DRAPES) ×3 IMPLANT
DRAPE INCISE IOBAN 85X60 (DRAPES) ×2 IMPLANT
DRSG VAC ATS MED SENSATRAC (GAUZE/BANDAGES/DRESSINGS) ×2 IMPLANT
DURAPREP 26ML APPLICATOR (WOUND CARE) ×5 IMPLANT
ELECT CAUTERY BLADE 6.4 (BLADE) ×3 IMPLANT
ELECT REM PT RETURN 9FT ADLT (ELECTROSURGICAL) ×3
ELECTRODE REM PT RTRN 9FT ADLT (ELECTROSURGICAL) ×1 IMPLANT
GLOVE BIOGEL PI IND STRL 8 (GLOVE) IMPLANT
GLOVE BIOGEL PI INDICATOR 8 (GLOVE) ×2
GLOVE ECLIPSE 7.5 STRL STRAW (GLOVE) ×2 IMPLANT
GLOVE SKINSENSE NS SZ7.5 (GLOVE) ×4
GLOVE SKINSENSE STRL SZ7.5 (GLOVE) ×2 IMPLANT
GOWN STRL REIN XL XLG (GOWN DISPOSABLE) ×3 IMPLANT
HANDPIECE INTERPULSE COAX TIP (DISPOSABLE) ×3
KIT BASIN OR (CUSTOM PROCEDURE TRAY) ×3 IMPLANT
KIT ROOM TURNOVER OR (KITS) ×3 IMPLANT
MANIFOLD NEPTUNE II (INSTRUMENTS) ×3 IMPLANT
NS IRRIG 1000ML POUR BTL (IV SOLUTION) ×3 IMPLANT
PACK UNIVERSAL I (CUSTOM PROCEDURE TRAY) ×3 IMPLANT
PAD ARMBOARD 7.5X6 YLW CONV (MISCELLANEOUS) ×4 IMPLANT
SET HNDPC FAN SPRY TIP SCT (DISPOSABLE) ×1 IMPLANT
TOWEL OR 17X24 6PK STRL BLUE (TOWEL DISPOSABLE) ×3 IMPLANT
TOWEL OR 17X26 10 PK STRL BLUE (TOWEL DISPOSABLE) ×3 IMPLANT

## 2016-08-03 NOTE — Transfer of Care (Signed)
Immediate Anesthesia Transfer of Care Note  Patient: Kristin [name]  Procedure(s) Performed: Procedure(s): IRRIGATION AND DEBRIDEMENT LEFT HIP, VAC PLACEMENT (Left)  Patient Location: PACU  Anesthesia Type:General  Level of Consciousness: awake, alert , oriented and patient cooperative  Airway & Oxygen Therapy: Patient Spontanous Breathing and Patient connected to nasal cannula oxygen  Post-op Assessment: Report given to RN, Post -op Vital signs reviewed and stable, Patient moving all extremities X 4 and Patient able to stick tongue midline  Post vital signs: Reviewed and stable  Last Vitals:  Vitals:   08/02/16 2114 08/03/16 1403  BP: (!) 125/92 (P) 135/85  Pulse: (!) 109   Resp: 18 (P) 20  Temp: 37.3 C     Last Pain:  Vitals:   08/02/16 2130  TempSrc:   PainSc: 2       Patients Stated Pain Goal: 0 ([id number])  Complications: No apparent anesthesia complications

## 2016-08-03 NOTE — Anesthesia Postprocedure Evaluation (Signed)
Anesthesia Post Note  Patient: Kristin [name]  Procedure(s) Performed: Procedure(s) (LRB): IRRIGATION AND DEBRIDEMENT LEFT HIP, VAC PLACEMENT (Left)  Patient location during evaluation: PACU Anesthesia Type: General Level of consciousness: awake and alert Pain management: pain level controlled Vital Signs Assessment: post-procedure vital signs reviewed and stable Respiratory status: spontaneous breathing, nonlabored ventilation, respiratory function stable and patient connected to nasal cannula oxygen Cardiovascular status: blood pressure returned to baseline and stable Postop Assessment: no signs of nausea or vomiting Anesthetic complications: no       Last Vitals:  Vitals:   08/03/16 1430 08/03/16 1445  BP: (!) 134/58 123/72  Pulse:    Resp:    Temp:      Last Pain:  Vitals:   08/03/16 1445  TempSrc:   PainSc: Tyler Deis

## 2016-08-03 NOTE — Plan of Care (Signed)
Problem: Physical Regulation: Goal: Postoperative complications will be avoided or minimized Outcome: Not Progressing Wound infection  Problem: Pain Management: Goal: Pain level will decrease with appropriate interventions Outcome: Progressing Medicated once for pain with full relief  Problem: Skin Integrity: Goal: Signs of wound healing will improve Outcome: Not Progressing Left hip incision not healing, moderate drainage noted, dressing changed once

## 2016-08-03 NOTE — Op Note (Signed)
   Date of Surgery: 08/03/2016  INDICATIONS: Kristin [name] is a 61 y.o.-year-old female with a left hip surgical wound dehiscence;  The patient did consent to the procedure after discussion of the risks and benefits.  PREOPERATIVE DIAGNOSIS: Left hip surgical wound dehiscence  POSTOPERATIVE DIAGNOSIS: Same.  PROCEDURE:  1. Irrigation and debridement of left hip wound dehiscence including, skin, subcutaneous tissue, fascia. 15 x 4 x 4 cm 2. Application wound VAC greater than 50 cm  SURGEON: N. Eduard Roux, M.D.  ASSIST: Loni Muse, PA student.  ANESTHESIA:  general  IV FLUIDS AND URINE: See anesthesia.  ESTIMATED BLOOD LOSS: 50 mL.  IMPLANTS: None  DRAINS: Wound VAC  COMPLICATIONS: None.  DESCRIPTION OF PROCEDURE: The patient was brought to the operating room and placed supine on the operating table.  The patient had been signed prior to the procedure and this was documented. The patient had the anesthesia placed by the anesthesiologist.  A time-out was performed to confirm that this was the correct patient, site, side and location. The patient did receive antibiotics prior to the incision and was re-dosed during the procedure as needed at indicated intervals. The patient had the operative extremity prepped and draped in the standard surgical fashion.    We removed the previous skin sutures and open the wound back up. There was obvious sign of surgical wound dehiscence with nonviable necrotic tissue. This was all sharply abraded using a knife and Roger. The fascia was intact and did not demonstrate any evidence of dehiscence or continued infection. After thorough sharp excisional debridement with Roger and knife this was then thoroughly irrigated with pulse lavage. The wound measured 15 x 4 x 4 cm.  At this point I felt that the wound could not be primarily closed given the swelling and tension. I decided to place a wound VAC in the surgical wound. This was turned to -125 mmHg. There was  a good seal and suction. Patient tolerated the procedure well and had no immediate competitions.  POSTOPERATIVE PLAN: Patient will need a home VAC to allow the wound to heal by secondary intention.  Kristin Cecil, MD [place] 1:50 PM

## 2016-08-03 NOTE — Anesthesia Preprocedure Evaluation (Signed)
Anesthesia Evaluation  Patient identified by MRN, date of birth, ID band Patient awake    Reviewed: Allergy & Precautions, NPO status , Patient's Chart, lab work & pertinent test results  History of Anesthesia Complications Negative for: history of anesthetic complications  Airway Mallampati: II  TM Distance: >3 FB Neck ROM: Full    Dental  (+) Poor Dentition, Missing   Pulmonary former smoker,    breath sounds clear to auscultation       Cardiovascular hypertension, Pt. on medications and Pt. on home beta blockers (-) CHF  Rhythm:Regular     Neuro/Psych  Headaches, Seizures -,  PSYCHIATRIC DISORDERS Anxiety Depression    GI/Hepatic PUD, GERD  Medicated,  Endo/Other  diabetes  Renal/GU Renal InsufficiencyRenal disease     Musculoskeletal  (+) Arthritis ,   Abdominal   Peds  Hematology  (+) anemia ,   Anesthesia Other Findings Broken and decayed right incisor.  Missing upper front right tooth.  Painful decayed tooth upper right near back.  Reproductive/Obstetrics                             Anesthesia Physical  Anesthesia Plan  ASA: III  Anesthesia Plan: General   Post-op Pain Management:    Induction: Intravenous  Airway Management Planned: Oral ETT  Additional Equipment: None  Intra-op Plan:   Post-operative Plan: Extubation in OR  Informed Consent: I have reviewed the patients History and Physical, chart, labs and discussed the procedure including the risks, benefits and alternatives for the proposed anesthesia with the patient or authorized representative who has indicated his/her understanding and acceptance.   Dental advisory given  Plan Discussed with: CRNA and Surgeon  Anesthesia Plan Comments:         Anesthesia Quick Evaluation

## 2016-08-03 NOTE — Anesthesia Procedure Notes (Signed)
Procedure Name: Intubation Date/Time: 08/03/2016 1:02 PM Performed by: Suzette Battiest Pre-anesthesia Checklist: Patient identified, Emergency Drugs available, Suction available and Patient being monitored Patient Re-evaluated:Patient Re-evaluated prior to inductionOxygen Delivery Method: Circle system utilized Preoxygenation: Pre-oxygenation with 100% oxygen Intubation Type: IV induction Ventilation: Mask ventilation without difficulty and Nasal airway inserted- appropriate to patient size Laryngoscope Size: Mac and 3 Grade View: Grade III Tube type: Oral Tube size: 7.0 mm Number of attempts: 1 Airway Equipment and Method: Patient positioned with wedge pillow and Stylet Placement Confirmation: ETT inserted through vocal cords under direct vision,  positive ETCO2 and breath sounds checked- equal and bilateral Secured at: 23 cm Tube secured with: Tape Dental Injury: Teeth and Oropharynx as per pre-operative assessment

## 2016-08-03 NOTE — H&P (Signed)

## 2016-08-03 NOTE — Progress Notes (Signed)
PT Cancellation Note  Patient Details Name: Kristin [name] MRN: [id number] DOB: 1956/05/24   Cancelled Treatment:    Reason Eval/Treat Not Completed: Patient at procedure or test/unavailable.  Pt in OR for repeat I and D.  We will follow up tomorrow for continued treatment.   Thanks,    Barbarann Ehlers. Kenzey Birkland, PT, DPT [phone]   08/03/2016, 12:15 PM

## 2016-08-04 ENCOUNTER — Encounter (HOSPITAL_COMMUNITY): Payer: Self-pay | Admitting: Orthopaedic Surgery

## 2016-08-04 LAB — CBC
HCT: 26.8 % — ABNORMAL LOW (ref 36.0–46.0)
Hemoglobin: 8.5 g/dL — ABNORMAL LOW (ref 12.0–15.0)
MCH: 30 pg (ref 26.0–34.0)
MCHC: 31.7 g/dL (ref 30.0–36.0)
MCV: 94.7 fL (ref 78.0–100.0)
Platelets: 446 10*3/uL — ABNORMAL HIGH (ref 150–400)
RBC: 2.83 MIL/uL — AB (ref 3.87–5.11)
RDW: 14.5 % (ref 11.5–15.5)
WBC: 6.5 10*3/uL (ref 4.0–10.5)

## 2016-08-04 LAB — GLUCOSE, CAPILLARY
GLUCOSE-CAPILLARY: 107 mg/dL — AB (ref 65–99)
GLUCOSE-CAPILLARY: 107 mg/dL — AB (ref 65–99)
Glucose-Capillary: 100 mg/dL — ABNORMAL HIGH (ref 65–99)
Glucose-Capillary: 97 mg/dL (ref 65–99)

## 2016-08-04 NOTE — Progress Notes (Signed)
Pharmacy Antibiotic Note  Kristin [name] is a 19 YOF with history of THR on 06/01/16 and presented with left hip pain and swelling- wound culture grew MRSA.  Pharmacy consulted to manage vancomycin. Today is Day #11.  Vancomycin trough therapeutic on 1/29 at 41mcg/mL on 1000mg  IV q12h. Renal function remains stable with SCr 0.89. Afebrile, WBC wnl   Plan:  - Continue vancomycin at 1000mg  IV Q12H - Monitor renal fxn, clinical progress, LOT - Weekly vancomycin trough unless renal function changes   Height: 5\' 2"  (157.5 cm) Weight: 250 lb (113.4 kg) IBW/kg (Calculated) : 50.1  Temp (24hrs), Avg:98.3 F (36.8 C), Min:97.3 F (36.3 C), Max:99.2 F (37.3 C)   Recent Labs Lab 07/29/16 0437 07/30/16 0605 08/01/16 0430 08/03/16 0851 08/04/16 0416  WBC  --   --   --   --  6.5  CREATININE 0.89  --  0.93 0.89  --   VANCOTROUGH  --  13* 17  --   --     Estimated Creatinine Clearance: 81 mL/min (by C-G formula based on SCr of 0.89 mg/dL).    Allergies  Allergen Reactions  . Fluconazole Swelling    REACTION: Face swelling  . Robitussin (Alcohol Free) [Guaifenesin] Palpitations    Chest pain    Antimicrobials this admission:  Zosyn 1/22 >> 1/24 Vanc 1/22 >> Ancef post-op 1/22  Dose adjustments this admission:  1/25 VT = 9 mcg/mL on 750mg  q12 (1/24 PM dose not charted as given) >> no change 1/29 VT = 17 on 1g IV q12h  Microbiology results:  1/22 L hip join fluid cx - MRSA   Elicia Lamp, PharmD, BCPS Clinical Pharmacist 08/04/2016 10:49 AM

## 2016-08-04 NOTE — Progress Notes (Signed)
Physical Therapy Treatment Patient Details Name: Kristin [name] MRN: [id number] DOB: 01/13/1956 Today's Date: 08/04/2016    History of Present Illness 61 yo female admitted on 06/01/16 for left THA direct anterior approach. PMH significant for HTN, Anxiety and Depression. She presented with acute left prosthetic hip infection for 1 week., s/p i&D and hip revision 07/25/16, repeat I and D and wound vac re-application on 08/03/16.     PT Comments    Pt is progressing well post op repeat I and D, supervision to mod I overall with RW.  HEP program continued to be reviewed.  PT to follow acutely until d/c confirmed.     Follow Up Recommendations  Home health PT;Supervision - Intermittent     Equipment Recommendations  None recommended by PT    Recommendations for Other Services  NA     Precautions / Restrictions Precautions Precautions: None Other Brace/Splint: wound vac L thigh Restrictions LLE Weight Bearing: Weight bearing as tolerated    Mobility  Bed Mobility               General bed mobility comments: Pt is OOB in the recliner chair.   Transfers Overall transfer level: Needs assistance Equipment used: Rolling walker (2 wheeled) Transfers: Sit to/from Stand Sit to Stand: Modified independent (Device/Increase time)         General transfer comment: Pt using hands for transitions.   Ambulation/Gait Ambulation/Gait assistance: Supervision Ambulation Distance (Feet): 85 Feet Assistive device: Rolling walker (2 wheeled) Gait Pattern/deviations: Step-through pattern;Antalgic;Trunk flexed Gait velocity: decreased Gait velocity interpretation: Below normal speed for age/gender General Gait Details: Pt with minimally antalgic gait pattern, takes rest breaks to stand tall (her hips don't fit all the way into the narrow walker, so she leans forward).           Balance Overall balance assessment: Needs assistance Sitting-balance support: Feet supported;No upper  extremity supported Sitting balance-Leahy Scale: Good     Standing balance support: Bilateral upper extremity supported;Single extremity supported;No upper extremity supported Standing balance-Leahy Scale: Fair                      Cognition Arousal/Alertness: Awake/alert Behavior During Therapy: WFL for tasks assessed/performed Overall Cognitive Status: Within Functional Limits for tasks assessed                      Exercises Total Joint Exercises Ankle Circles/Pumps: AROM;Both;20 reps Quad Sets: AROM;Left;10 reps Heel Slides: AAROM;Left;10 reps Long Arc Quad: AROM;Left;10 reps        Pertinent Vitals/Pain Pain Assessment: 0-10 Pain Score: 7  Pain Location: left thigh Pain Descriptors / Indicators: Aching;Sore Pain Intervention(s): Limited activity within patient's tolerance;Monitored during session;Repositioned           PT Goals (current goals can now be found in the care plan section) Acute Rehab PT Goals Patient Stated Goal: return home.   Progress towards PT goals: Progressing toward goals    Frequency    7X/week      PT Plan Current plan remains appropriate       End of Session Equipment Utilized During Treatment: Gait belt Activity Tolerance: Patient limited by pain Patient left: in chair;with call bell/phone within reach     Time: 4098-1191 PT Time Calculation (min) (ACUTE ONLY): 28 min  Charges:  $Gait Training: 8-22 mins $Therapeutic Exercise: 8-22 mins  Rollene Rotunda Elyna Pangilinan, PT, DPT [phone]   08/04/2016, 3:31 PM

## 2016-08-04 NOTE — Progress Notes (Signed)
Physical Therapy Treatment Patient Details Name: Kristin [name] MRN: [id number] DOB: 1955/07/23 Today's Date: 08/04/2016    History of Present Illness 61 yo female admitted on 06/01/16 for left THA direct anterior approach. PMH significant for HTN, Anxiety and Depression. She presented with acute left prosthetic hip infection for 1 week., s/p i&D and hip revision 07/25/16, repeat I and D and wound vac re-application on [id number].     PT Comments    Pt is progressing well with reduction of assistance needed with all mobility. Pt able to tolerate ambulation followed by standing HEP without exacerbation of pain. PT will follow acutely until d/c.   Follow Up Recommendations  Home health PT;Supervision - Intermittent     Equipment Recommendations  None recommended by PT    Recommendations for Other Services       Precautions / Restrictions Precautions Precautions: None Other Brace/Splint: wound vac L thigh Restrictions LLE Weight Bearing: Weight bearing as tolerated    Mobility  Bed Mobility Overal bed mobility: Modified Independent Bed Mobility: Supine to Sit     Supine to sit: Modified independent (Device/Increase time)     General bed mobility comments: increased time  Transfers Overall transfer level: Needs assistance Equipment used: Rolling walker (2 wheeled) Transfers: Sit to/from Stand Sit to Stand: Modified independent (Device/Increase time)         General transfer comment: increased time, uses B UE for transition  Ambulation/Gait Ambulation/Gait assistance: Supervision Ambulation Distance (Feet): 100 Feet Assistive device: Rolling walker (2 wheeled) Gait Pattern/deviations: Step-through pattern;Decreased stride length;Antalgic;Trunk flexed;Wide base of support Gait velocity: decreased Gait velocity interpretation: Below normal speed for age/gender General Gait Details: forward trunk lean, smooth turn around in hallway with approriate RW use   Stairs             Wheelchair Mobility    Modified Rankin (Stroke Patients Only)       Balance Overall balance assessment: Needs assistance Sitting-balance support: Feet supported Sitting balance-Leahy Scale: Good     Standing balance support: Bilateral upper extremity supported Standing balance-Leahy Scale: Fair                      Cognition Arousal/Alertness: Awake/alert Behavior During Therapy: WFL for tasks assessed/performed Overall Cognitive Status: Within Functional Limits for tasks assessed                      Exercises Total Joint Exercises Ankle Circles/Pumps: AROM;Both;20 reps Quad Sets: AROM;Left;10 reps Heel Slides: AAROM;Left;10 reps Hip ABduction/ADduction: AROM;Strengthening;Left;10 reps;Standing Long Arc Quad: AROM;Left;10 reps Marching in Standing: AROM;Strengthening;Left;10 reps;Standing Standing Hip Extension: AROM;Left;Strengthening;10 reps;Standing    General Comments        Pertinent Vitals/Pain Pain Assessment: 0-10 Pain Score: 5  Pain Location: left thigh Pain Descriptors / Indicators: Aching;Sore Pain Intervention(s): Limited activity within patient's tolerance;Monitored during session;Repositioned;Ice applied    Home Living                      Prior Function            PT Goals (current goals can now be found in the care plan section) Acute Rehab PT Goals Patient Stated Goal: return home.   Progress towards PT goals: Progressing toward goals    Frequency    7X/week      PT Plan Current plan remains appropriate    Co-evaluation             End of Session  Equipment Utilized During Treatment: Gait belt Activity Tolerance: Patient tolerated treatment well Patient left: in chair;with call bell/phone within reach;with nursing/sitter in room     Time: 1502-1528 PT Time Calculation (min) (ACUTE ONLY): 26 min  Charges:  $Gait Training: 8-22 mins $Therapeutic Exercise: 8-22 mins                     G Codes:      Tracie Harrier August 24, 2016, 4:59 PM  Tracie Harrier, SPT Acute Rehab SPT [phone]

## 2016-08-04 NOTE — Progress Notes (Signed)
Patient complains of a small amount of pain in her right rib cage area. Nurse gave Maalox. Nurse will continue to monitor

## 2016-08-05 LAB — BASIC METABOLIC PANEL
ANION GAP: 8 (ref 5–15)
BUN: 7 mg/dL (ref 6–20)
CO2: 31 mmol/L (ref 22–32)
Calcium: 8.7 mg/dL — ABNORMAL LOW (ref 8.9–10.3)
Chloride: 96 mmol/L — ABNORMAL LOW (ref 101–111)
Creatinine, Ser: 1.03 mg/dL — ABNORMAL HIGH (ref 0.44–1.00)
GFR calc Af Amer: >60 mL/min (ref >60)
GFR, EST NON AFRICAN AMERICAN: 58 mL/min — AB (ref >60)
GLUCOSE: 128 mg/dL — AB (ref 65–99)
Potassium: 3.4 mmol/L — ABNORMAL LOW (ref 3.5–5.1)
Sodium: 135 mmol/L (ref 135–145)

## 2016-08-05 LAB — GLUCOSE, CAPILLARY
Glucose-Capillary: 124 mg/dL — ABNORMAL HIGH (ref 65–99)
Glucose-Capillary: 100 mg/dL — ABNORMAL HIGH (ref 65–99)
Glucose-Capillary: 102 mg/dL — ABNORMAL HIGH (ref 65–99)

## 2016-08-05 MED ORDER — HEPARIN SOD (PORK) LOCK FLUSH 100 UNIT/ML IV SOLN
250.0000 [IU] | INTRAVENOUS | Status: AC | PRN
  Administered 2016-08-05: 250 [IU]

## 2016-08-05 NOTE — Discharge Summary (Signed)
Physician Discharge Summary      Patient ID: Kristin [name] MRN: [id number] DOB/AGE: February 09, 1956 61 y.o.  Admit date: 07/25/2016 Discharge date: 08/05/2016  Admission Diagnoses:  <principal problem not specified>  Discharge Diagnoses:  Active Problems:   Status post left hip replacement   Past Medical History:  Diagnosis Date  . Allergy   . Anemia   . Anxiety   . Arthritis   . Back pain, chronic   . Bronchitis   . Cough   . Depression   . GERD (gastroesophageal reflux disease)   . Hyperlipidemia   . Hypertension   . Obesity   . 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 Seizures ([place])    last >10 yrs ago  . Somatization disorder   . Tobacco abuse     Surgeries: Procedure(s): IRRIGATION AND DEBRIDEMENT LEFT HIP, VAC PLACEMENT on 07/25/2016 - 08/03/2016   Consultants (if any):   Discharged Condition: Improved  Hospital Course: Kristin [name] is an 61 y.o. female who was admitted 07/25/2016 with a diagnosis of <principal problem not specified> and went to the operating room on 07/25/2016 - 08/03/2016 and underwent the above named procedures.    She was given perioperative antibiotics:  Anti-infectives    Start     Dose/Rate Route Frequency Ordered Stop   08/03/16 0000  vancomycin IVPB     1,000 mg Intravenous Every 12 hours 08/03/16 1401 09/10/16 2359   08/03/16 0000  vancomycin (VANCOCIN) 1-5 GM/200ML-% SOLN     1,000 mg 200 mL/hr over 60 Minutes Intravenous Every 12 hours 08/03/16 1401 11/19/16 2359   07/30/16 1700  vancomycin (VANCOCIN) IVPB 1000 mg/200 mL premix     1,000 mg 200 mL/hr over 60 Minutes Intravenous Every 12 hours 07/30/16 0706     07/26/16 0630  vancomycin (VANCOCIN) IVPB 750 mg/150 ml premix  Status:  Discontinued     750 mg 150 mL/hr over 60 Minutes Intravenous Every 12 hours 07/25/16 2252 07/30/16 0706   07/25/16 2330  piperacillin-tazobactam (ZOSYN) IVPB 3.375 g  Status:  Discontinued     3.375 g 12.5 mL/hr over 240 Minutes  Intravenous Every 8 hours 07/25/16 2252 07/27/16 1528   07/25/16 2215  ceFAZolin (ANCEF) IVPB 2g/100 mL premix     2 g 200 mL/hr over 30 Minutes Intravenous Every 6 hours 07/25/16 2214 07/26/16 0449   07/25/16 1818  vancomycin (VANCOCIN) powder  Status:  Discontinued       As needed 07/25/16 1754 07/25/16 2027   07/25/16 1800  piperacillin-tazobactam (ZOSYN) IVPB 3.375 g     3.375 g 100 mL/hr over 30 Minutes Intravenous To Surgery 07/25/16 1732 07/25/16 1842   07/25/16 1800  vancomycin (VANCOCIN) IVPB 1000 mg/200 mL premix     1,000 mg 200 mL/hr over 60 Minutes Intravenous To Surgery 07/25/16 1732 07/25/16 1930   07/25/16 1723  ceFAZolin (ANCEF) 2-4 GM/100ML-% IVPB  Status:  Discontinued    Comments:  Rejeana Brock   : cabinet override      07/25/16 1723 07/25/16 1727    .  She was given sequential compression devices, early ambulation, and aspirin for DVT prophylaxis.  She benefited maximally from the hospital stay and there were no complications.    Recent vital signs:  Vitals:   08/04/16 2048 08/05/16 0546  BP: 129/60 116/61  Pulse: 93 93  Resp: 16 16  Temp: 97.8 F (36.6 C) 98.3 F (36.8 C)    Recent laboratory studies:  Lab Results  Component Value Date   HGB 8.5 (L) 08/04/2016   HGB 9.4 (L) 07/28/2016   HGB 9.6 (L) 07/27/2016   Lab Results  Component Value Date   WBC 6.5 08/04/2016   PLT 446 (H) 08/04/2016   Lab Results  Component Value Date   INR 1.04 05/23/2016   Lab Results  Component Value Date   NA 135 08/05/2016   K 3.4 (L) 08/05/2016   CL 96 (L) 08/05/2016   CO2 31 08/05/2016   BUN 7 08/05/2016   CREATININE 1.03 (H) 08/05/2016   GLUCOSE 128 (H) 08/05/2016    Discharge Medications:   Allergies as of 08/05/2016      Reactions   Fluconazole Swelling   REACTION: Face swelling   Robitussin (alcohol Free) [guaifenesin] Palpitations   Chest pain      Medication List    TAKE these medications   acetaminophen 500 MG tablet Commonly known as:   TYLENOL Take 500-1,000 mg by mouth every 6 (six) hours as needed for headache (or pain).   amitriptyline 75 MG tablet Commonly known as:  ELAVIL Take 75 mg by mouth 2 (two) times daily.   aspirin EC 325 MG tablet Take 1 tablet (325 mg total) by mouth 2 (two) times daily.   cyclobenzaprine 10 MG tablet Commonly known as:  FLEXERIL Take 1 tablet (10 mg total) by mouth 3 (three) times daily as needed for muscle spasms.   desonide 0.05 % ointment Commonly known as:  DESOWEN Apply 1 application topically 2 (two) times daily.   hydrochlorothiazide 25 MG tablet Commonly known as:  HYDRODIURIL Take 1 tablet (25 mg total) by mouth daily.   lovastatin 40 MG tablet Commonly known as:  MEVACOR Take 1 tablet (40 mg total) by mouth at bedtime.   methocarbamol 750 MG tablet Commonly known as:  ROBAXIN Take 1 tablet (750 mg total) by mouth 2 (two) times daily as needed for muscle spasms.   metoprolol tartrate 25 MG tablet Commonly known as:  LOPRESSOR TAKE ONE TABLET BY MOUTH TWICE DAILY   naproxen 500 MG tablet Commonly known as:  NAPROSYN TAKE ONE TABLET BY MOUTH TWICE DAILY BETWEEN  MEALS  AS  NEEDED  FOR  MILD  PAIN,  MODERATE  PAIN OR  HEADACHE. Take with food   omeprazole 20 MG capsule Commonly known as:  PRILOSEC Take 1 capsule (20 mg total) by mouth daily. What changed:  when to take this   omeprazole 20 MG capsule Commonly known as:  PRILOSEC TAKE ONE CAPSULE BY MOUTH ONCE DAILY AS NEEDED What changed:  Another medication with the same name was changed. Make sure you understand how and when to take each.   ondansetron 4 MG tablet Commonly known as:  ZOFRAN Take 1-2 tablets (4-8 mg total) by mouth every 8 (eight) hours as needed for nausea or vomiting.   oxyCODONE 10 mg 12 hr tablet Commonly known as:  OXYCONTIN Take 1 tablet (10 mg total) by mouth every 12 (twelve) hours.   oxyCODONE-acetaminophen 5-325 MG tablet Commonly known as:  PERCOCET Take 1-2 tablets by  mouth every 4 (four) hours as needed for severe pain. What changed:  Another medication with the same name was added. Make sure you understand how and when to take each.   oxyCODONE-acetaminophen 5-325 MG tablet Commonly known as:  PERCOCET/ROXICET Take 1-2 tablets by mouth every 4 (four) hours as needed for severe pain. What changed:  Another medication with the same name was added.  Make sure you understand how and when to take each.   oxyCODONE-acetaminophen 5-325 MG tablet Commonly known as:  PERCOCET Take 1-2 tablets by mouth every 4 (four) hours as needed for severe pain. What changed:  You were already taking a medication with the same name, and this prescription was added. Make sure you understand how and when to take each.   promethazine 25 MG tablet Commonly known as:  PHENERGAN Take 1 tablet (25 mg total) by mouth every 6 (six) hours as needed for nausea. What changed:  Another medication with the same name was added. Make sure you understand how and when to take each.   promethazine 25 MG tablet Commonly known as:  PHENERGAN Take 1 tablet (25 mg total) by mouth every 6 (six) hours as needed for nausea. What changed:  You were already taking a medication with the same name, and this prescription was added. Make sure you understand how and when to take each.   senna-docusate 8.6-50 MG tablet Commonly known as:  SENOKOT S Take 1 tablet by mouth at bedtime as needed.   vancomycin 1-5 GM/200ML-% Soln Commonly known as:  VANCOCIN Inject 200 mLs (1,000 mg total) into the vein every 12 (twelve) hours.   vancomycin IVPB Inject 1,000 mg into the vein every 12 (twelve) hours. Indication:  Prosthetic joint infection Last Day of Therapy:  09/04/16 Labs - 'Sunday/Monday:  CBC/D, BMP, and vancomycin trough. Labs - Thursday:  BMP and vancomycin trough Labs - Every other week:  ESR and CRP            Home Infusion Instuctions        Start     Ordered   08/03/16 0000  Home infusion  instructions [facility] May follow [facility] Dosing Protocol; May administer Cathflo as needed to maintain patency of vascular access device.; Flushing of vascular access device: per [facility] Protocol: 0.9% NaCl pre/post medica...    Question Answer Comment  Instructions May follow [facility] Dosing Protocol   Instructions May administer Cathflo as needed to maintain patency of vascular access device.   Instructions Flushing of vascular access device: per [facility] Protocol: 0.9% NaCl pre/post medication administration and prn patency; Heparin 100 u/ml, 5ml for implanted ports and Heparin 10u/ml, 5ml for all other central venous catheters.   Instructions May follow [facility] Anaphylaxis Protocol for First Dose Administration in the home: 0.9% NaCl at 25-50 ml/hr to maintain IV access for protocol meds. Epinephrine 0.3 ml IV/IM PRN and Benadryl 25-50 IV/IM PRN s/s of anaphylaxis.   Instructions [facility] Infusion Coordinator (RN) to assist per patient IV care needs in the home PRN.      08/03/16 1401       Durable Medical Equipment        Start     Ordered   07/25/16 2214  DME Walker rolling  Once    Question:  Patient needs a walker to treat with the following condition  Answer:  History of hip replacement   07/25/16 2214   07/25/16 2214  DME 3 n 1  Once     01' /22/18 2214   07/25/16 2214  DME Bedside commode  Once    Question:  Patient needs a bedside commode to treat with the following condition  Answer:  History of hip replacement   07/25/16 2214      Diagnostic Studies: Dg Pelvis Portable  Result Date: 07/25/2016 CLINICAL DATA:  Status post irrigation debridement and left hip revision  EXAM: PORTABLE PELVIS 1-2 VIEWS COMPARISON:  07/05/2016 FINDINGS: Left hip prosthesis is again noted. Multiple radiopaque densities are noted about the hip joint consistent with the recent surgical revision. No acute abnormality is noted. IMPRESSION: Status post left hip revision Electronically  Signed   By: Inez Catalina M.D.   On: 07/25/2016 21:47   US Thyroid Biopsy  Result Date: 07/14/2016 INDICATION: Indeterminate right thyroid nodule EXAM: ULTRASOUND GUIDED FINE NEEDLE ASPIRATION OF INDETERMINATE RIGHT THYROID NODULE COMPARISON:  Ultrasound done 06/22/2016 MEDICATIONS: 1% Lidocaine = 4 mL COMPLICATIONS: None immediate. TECHNIQUE: Informed written consent was obtained from the patient after a discussion of the risks, benefits and alternatives to treatment. Questions regarding the procedure were encouraged and answered. A timeout was performed prior to the initiation of the procedure. Pre-procedural ultrasound scanning demonstrated unchanged size and appearance of the indeterminate nodule within the right inferior pole of the thyroid. The procedure was planned. The neck was prepped in the usual sterile fashion, and a sterile drape was applied covering the operative field. A timeout was performed prior to the initiation of the procedure. Local anesthesia was provided with 1% lidocaine. Under direct ultrasound guidance, 4 FNA biopsies were performed of the right thyroid nodule with a 25 gauge needle. Multiple ultrasound images were saved for procedural documentation purposes. The samples were prepared and submitted to pathology. Limited post procedural scanning was negative for hematoma or additional complication. Dressings were placed. The patient tolerated the above procedures procedure well without immediate postprocedural complication. FINDINGS: FINDINGS Nodule reference number based on prior diagnostic ultrasound: 2 Maximum size:  1.9 cm Location: Right  ;  Inferior ACR TI-RADS total points: 4 ACR TI-RADS risk category:  TR4 Prior biopsy:  No Reason for biopsy: meets ACR TI-RADS criteria Ultrasound imaging confirms appropriate placement of the needles within the thyroid nodule. IMPRESSION: Technically successful ultrasound guided fine needle aspiration of right lower pole thyroid nodule. Read by:   Gareth Eagle, PA-C Electronically Signed   By: Lucrezia Europe M.D.   On: 07/14/2016 13:56   Xr Hip Unilat W Or W/o Pelvis 2-3 Views Left  Result Date: 07/19/2016 AP pelvis lateral left hip reviewed.  Leg lengths approximately equal.  Cup and stem position looked good.  No lucency around either.  No evidence of subsidence or other hardware complications.   Disposition: 06-Home-Health Care Svc  Discharge Instructions    Call MD / Call 911    Complete by:  As directed    If you experience chest pain or shortness of breath, CALL 911 and be transported to the hospital emergency room.  If you develope a fever above 101.5 F, pus (white drainage) or increased drainage or redness at the wound, or calf pain, call your surgeon's office.   Constipation Prevention    Complete by:  As directed    Drink plenty of fluids.  Prune juice may be helpful.  You may use a stool softener, such as Colace (over the counter) 100 mg twice a day.  Use MiraLax (over the counter) for constipation as needed.   Driving restrictions    Complete by:  As directed    No driving while taking narcotic pain meds.   Home infusion instructions [facility] May follow [place] Dosing Protocol; May administer Cathflo as needed to maintain patency of vascular access device.; Flushing of vascular access device: per [facility] Protocol: 0.9% NaCl pre/post medica...    Complete by:  As directed    Instructions:  May follow [facility]  Dosing Protocol   Instructions:  May administer Cathflo as needed to maintain patency of vascular access device.   Instructions:  Flushing of vascular access device: per [facility] Protocol: 0.9% NaCl pre/post medication administration and prn patency; Heparin 100 u/ml, 19m for implanted ports and Heparin 10u/ml, 537mfor all other central venous catheters.   Instructions:  May follow [facility] Anaphylaxis Protocol for First Dose Administration in the home: 0.9% NaCl at 25-50 ml/hr to maintain IV access for protocol meds.  Epinephrine 0.3 ml IV/IM PRN and Benadryl 25-50 IV/IM PRN s/s of anaphylaxis.   Instructions:  [facility] Coordinator (RN) to assist per patient IV care needs in the home PRN.   Increase activity slowly as tolerated    Complete by:  As directed       Follow-up Information    MiEduard RouxMD Follow up in 2 week(s).   Specialty:  Orthopedic Surgery Why:  For suture removal, For wound re-check Contact information: [id number] [phone]          Signed: MiEduard Roux/08/2016, 7:25 AM

## 2016-08-05 NOTE — Progress Notes (Signed)
Switched pt over to home [employer] VAC and set to continuous at 125. Pt tolerated well and suction appears to be good. IV team notified to cap of PICC line. Will continue to monitor

## 2016-08-05 NOTE — Progress Notes (Signed)
Discharge instructions discussed with patient. All questions answered. Pt verbalized understanding. Pt waiting for IV nurse to come to flush and cap PICC line at this time.

## 2016-08-05 NOTE — Progress Notes (Signed)
Physical Therapy Treatment Patient Details Name: Kristin [name] MRN: [id number] DOB: 1956/04/13 Today's Date: 08/05/2016    History of Present Illness 61 yo female admitted on 06/01/16 for left THA direct anterior approach. PMH significant for HTN, Anxiety and Depression. She presented with acute left prosthetic hip infection for 1 week., s/p i&D and hip revision 07/25/16, repeat I and D and wound vac re-application on 08/03/16.     PT Comments    Reinforced stair training, seated and supine HEP exercises.  Pt would benefit (if here tomorrow) from bed mobility practice from flat bed with no rail.  Pt is hopeful to d/c home this PM after her home wound vac is delivered.   Follow Up Recommendations  Home health PT;Supervision - Intermittent     Equipment Recommendations  None recommended by PT    Recommendations for Other Services   NA     Precautions / Restrictions Precautions Precautions: None Precaution Comments: Pt reports she still has her HEP handout from her original surgery.  Required Braces or Orthoses: Other Brace/Splint Other Brace/Splint: wound vac L thigh Restrictions LLE Weight Bearing: Weight bearing as tolerated    Mobility  Bed Mobility               General bed mobility comments: Pt is OOB in recliner chair. She reports she still uses her hands to get her leg EOB.  It would be good to practice bed mobility from flat bed with no rails prior to d/c  Transfers Overall transfer level: Needs assistance Equipment used: Rolling walker (2 wheeled) Transfers: Sit to/from Stand Sit to Stand: Modified independent (Device/Increase time)         General transfer comment: Uses hands to control transitions.   Ambulation/Gait Ambulation/Gait assistance: Supervision Ambulation Distance (Feet): 120 Feet Assistive device: Rolling walker (2 wheeled) Gait Pattern/deviations: Step-through pattern;Antalgic;Trunk flexed Gait velocity: decreased Gait velocity  interpretation: Below normal speed for age/gender General Gait Details: Trunk flexed because hips will not fit in RW, verbal cues for lighter hands if she can.    Stairs Stairs: Yes   Stair Management: Forwards;With walker;Step to pattern Number of Stairs: 3 General stair comments: Pt able to demonstrate safe stair technique, min guard assit for safety and to help move/stabilize RW while stepping.          Balance Overall balance assessment: Needs assistance Sitting-balance support: Feet supported;No upper extremity supported Sitting balance-Leahy Scale: Good     Standing balance support: Bilateral upper extremity supported Standing balance-Leahy Scale: Fair                      Cognition Arousal/Alertness: Awake/alert Behavior During Therapy: WFL for tasks assessed/performed Overall Cognitive Status: Within Functional Limits for tasks assessed                      Exercises Total Joint Exercises Ankle Circles/Pumps: AROM;Both;20 reps Quad Sets: AROM;Both;10 reps Heel Slides: AAROM;Left;10 reps Hip ABduction/ADduction: AAROM;Left;10 reps Long Arc Quad: AROM;Both;10 reps        Pertinent Vitals/Pain Pain Assessment: 0-10 Pain Score: 7  Pain Location: left thigh/incisional Pain Descriptors / Indicators: Aching;Sore Pain Intervention(s): Limited activity within patient's tolerance;Monitored during session;Repositioned           PT Goals (current goals can now be found in the care plan section) Acute Rehab PT Goals Patient Stated Goal: return home.   Progress towards PT goals: Progressing toward goals    Frequency  7X/week      PT Plan Current plan remains appropriate       End of Session   Activity Tolerance: Patient limited by pain Patient left: in chair;with call bell/phone within reach     Time: 1202-1230 PT Time Calculation (min) (ACUTE ONLY): 28 min  Charges:  $Gait Training: 8-22 mins $Therapeutic Exercise: 8-22  mins                      Kathia Covington B. Trinette Vera, PT, DPT [phone]   08/05/2016, 5:40 PM

## 2016-08-05 NOTE — Care Management Note (Signed)
Case Management Note  Patient Details  Name: Kristin [name] MRN: [id number] Date of Birth: April 21, 1956  Subjective/Objective:      61 yr old female admitted with left hip infection. S/p left hip revision.   Action/Plan: Case manager has spoken with patient concerning Home health and DME needs. Patient has a PICC line for IV antibiotics and has been receiving services through [facility]. That is not changing. Patient will go home on IV antibiotics and will have a wound vac to left hip. CM has faxed orders for wound vac to [employer] with [facility]. Notified [facility] Liaison that patient will need RN for [facility] dressing changes. Patient has RW and 3in1. Wound Vac to be delivered to patient's room this morning.   Expected Discharge Date:  08/05/16               Expected Discharge Plan:  [place]  In-House Referral:     Discharge planning Services  CM Consult  Post Acute Care Choice:  Durable Medical Equipment, Home Health, Resumption of Svcs/PTA Provider Choice offered to:  Patient  DME Arranged:  Vac DME Agency:  [facility]  HH Arranged:  RN, PT [place] Agency:  [facility]  Status of Service:  Completed, signed off  If discussed at [place] of Stay Meetings, dates discussed:    Additional Comments:  Ninfa Meeker, RN 08/05/2016, 8:58 AM

## 2016-08-05 NOTE — Progress Notes (Signed)
Kristin [name] is stable today. Her pain is well controlled.  VAC has good seal and suction.  At this point, she can be discharged home as soon as her [name] and home VAC are set up.  She will need wound vac changes by [facility] RN every Tues, Thurs, Sat.  She will follow up in 2 weeks with me.

## 2016-08-05 NOTE — Progress Notes (Signed)
Arrived to cap and flush PICC for discharge but pt not in room.   Will come back later.

## 2016-08-08 ENCOUNTER — Encounter (HOSPITAL_COMMUNITY): Payer: Self-pay | Admitting: Emergency Medicine

## 2016-08-08 ENCOUNTER — Other Ambulatory Visit (HOSPITAL_COMMUNITY)
Admission: RE | Admit: 2016-08-08 | Discharge: 2016-08-08 | Disposition: A | Discharge status: Home / Self Care | Payer: Self-pay | Attending: *Deleted | Admitting: *Deleted

## 2016-08-08 ENCOUNTER — Emergency Department (HOSPITAL_COMMUNITY)
Admission: EM | Admit: 2016-08-08 | Discharge: 2016-08-08 | Disposition: A | Discharge status: Home / Self Care | Payer: Self-pay | Attending: Emergency Medicine | Admitting: Emergency Medicine

## 2016-08-08 ENCOUNTER — Other Ambulatory Visit: Payer: Self-pay | Admitting: Internal Medicine

## 2016-08-08 DIAGNOSIS — T82898A Other specified complication of vascular prosthetic devices, implants and grafts, initial encounter: Secondary | ICD-10-CM

## 2016-08-08 DIAGNOSIS — Z96642 Presence of left artificial hip joint: Secondary | ICD-10-CM | POA: Insufficient documentation

## 2016-08-08 DIAGNOSIS — I10 Essential (primary) hypertension: Secondary | ICD-10-CM | POA: Insufficient documentation

## 2016-08-08 DIAGNOSIS — Z79899 Other long term (current) drug therapy: Secondary | ICD-10-CM | POA: Insufficient documentation

## 2016-08-08 DIAGNOSIS — E119 Type 2 diabetes mellitus without complications: Secondary | ICD-10-CM | POA: Insufficient documentation

## 2016-08-08 DIAGNOSIS — Z7982 Long term (current) use of aspirin: Secondary | ICD-10-CM | POA: Insufficient documentation

## 2016-08-08 DIAGNOSIS — T82868A Thrombosis of vascular prosthetic devices, implants and grafts, initial encounter: Secondary | ICD-10-CM | POA: Insufficient documentation

## 2016-08-08 DIAGNOSIS — Z87891 Personal history of nicotine dependence: Secondary | ICD-10-CM | POA: Insufficient documentation

## 2016-08-08 DIAGNOSIS — Y829 Unspecified medical devices associated with adverse incidents: Secondary | ICD-10-CM | POA: Insufficient documentation

## 2016-08-08 LAB — CBC WITH DIFFERENTIAL/PLATELET
BASOS ABS: 0 10*3/uL (ref 0.0–0.1)
BASOS PCT: 1 %
EOS ABS: 0.2 10*3/uL (ref 0.0–0.7)
EOS PCT: 4 %
HCT: 26.2 % — ABNORMAL LOW (ref 36.0–46.0)
Hemoglobin: 8.5 g/dL — ABNORMAL LOW (ref 12.0–15.0)
Lymphocytes Relative: 23 %
Lymphs Abs: 1.3 10*3/uL (ref 0.7–4.0)
MCH: 30.5 pg (ref 26.0–34.0)
MCHC: 32.4 g/dL (ref 30.0–36.0)
MCV: 93.9 fL (ref 78.0–100.0)
Monocytes Absolute: 0.7 10*3/uL (ref 0.1–1.0)
Monocytes Relative: 12 %
NEUTROS PCT: 60 %
Neutro Abs: 3.3 10*3/uL (ref 1.7–7.7)
PLATELETS: 452 10*3/uL — AB (ref 150–400)
RBC: 2.79 MIL/uL — ABNORMAL LOW (ref 3.87–5.11)
RDW: 14 % (ref 11.5–15.5)
WBC: 5.6 10*3/uL (ref 4.0–10.5)

## 2016-08-08 LAB — BASIC METABOLIC PANEL
ANION GAP: 6 (ref 5–15)
BUN: 9 mg/dL (ref 6–20)
CHLORIDE: 101 mmol/L (ref 101–111)
CO2: 30 mmol/L (ref 22–32)
Calcium: 8.6 mg/dL — ABNORMAL LOW (ref 8.9–10.3)
Creatinine, Ser: 0.89 mg/dL (ref 0.44–1.00)
GFR calc non Af Amer: >60 mL/min (ref >60)
Glucose, Bld: 102 mg/dL — ABNORMAL HIGH (ref 65–99)
POTASSIUM: 3.8 mmol/L (ref 3.5–5.1)
Sodium: 137 mmol/L (ref 135–145)

## 2016-08-08 LAB — VANCOMYCIN, TROUGH: VANCOMYCIN TR: 14 ug/mL — AB (ref 15–20)

## 2016-08-08 MED ORDER — ALTEPLASE 2 MG IJ SOLR: 2.0000 mg | Freq: Once | INTRAMUSCULAR | Status: DC

## 2016-08-08 MED ORDER — VANCOMYCIN HCL 10 G IV SOLR
1500.0000 mg | Freq: Once | INTRAVENOUS | Status: AC
  Administered 2016-08-08: 1500 mg via INTRAVENOUS
  Filled 2016-08-08: qty 1500

## 2016-08-08 MED ORDER — HEPARIN SOD (PORK) LOCK FLUSH 100 UNIT/ML IV SOLN
250.0000 [IU] | INTRAVENOUS | Status: AC | PRN
  Administered 2016-08-08: 250 [IU]

## 2016-08-08 MED ORDER — OXYCODONE-ACETAMINOPHEN 5-325 MG PO TABS
2.0000 | ORAL_TABLET | Freq: Once | ORAL | Status: AC
  Administered 2016-08-08: 2 via ORAL
  Filled 2016-08-08: qty 2

## 2016-08-08 NOTE — ED Notes (Signed)
IV Team at bedside, states PICC OK to use - no need for Alteplase.

## 2016-08-08 NOTE — ED Triage Notes (Signed)
Patient presents to ED to have PICC line checked - states her home health nurse was over administering an antibiotic (pt hospitalized for L hip replacement last week) and noticed that the line was clamped off. Per pt, the nurse attempted to flush the PICC line and it wouldn't flush. Sent here to make sure line is okay. Pt denies pain/SOB/fevers. PICC site without redness/swelling.

## 2016-08-08 NOTE — ED Provider Notes (Signed)
[facility] DEPT Provider Note   CSN: [id number] Arrival date & time: 08/08/16  1511     History   Chief Complaint Chief Complaint  Patient presents with  . Vascular Access Problem    HPI Kristin [name] is a 61 y.o. female.  The history is provided by the patient and medical records.    61 year old female with history of seasonal allergies, anemia, chronic back pain, depression, GERD, hyperlipidemia, hypertension, presenting to the ED with clogged PICC line.  Patient underwent left THA on 06/01/2016 with [name].  She subsequently encountered prosthesis infection requiring I&D with cleanout 2. States she had a PICC line inserted and was sent home on IV vancomycin daily. States her home health care nurse today came to administer antibiotics and change her dressings and she noticed that her PICC line was clamped off. Patient states she thinks they forgot to unclamp it yesterday which caused a clot. She's not had any issues with redness or swelling of the right arm where the PICC line is. She's not had any fever. States otherwise she has been doing well.  Past Medical History:  Diagnosis Date  . Allergy   . Anemia   . Anxiety   . Arthritis   . Back pain, chronic   . Bronchitis   . Cough   . Depression   . GERD (gastroesophageal reflux disease)   . Hyperlipidemia   . Hypertension   . Obesity   . 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 Seizures ([place])    last >10 yrs ago  . Somatization disorder   . Tobacco abuse     Patient Active Problem List   Diagnosis Date Noted  . Status post left hip replacement 06/01/2016  . Sinus tachycardia 05/19/2016  . History of adenomatous polyp of colon 03/21/2016  . Diabetes ([place]) 01/21/2016  . Thyroid nodule 12/25/2014  . ARDS (adult respiratory distress syndrome) ([place])   . Infiltrate noted on imaging study   . Acute respiratory failure with hypoxia ([place])   . Pharyngeal swelling   . Tonsillar abscess 12/06/2014  .  Tonsil, abscess 12/06/2014  . Cervical spine arthritis ([place]) 11/14/2014  . Primary osteoarthritis of left hip 11/14/2014  . Loss of consciousness ([place]) 11/14/2014  . Cough 05/26/2014  . Musculoskeletal chest and rib pain 03/26/2014  . Rib pain on right side 02/28/2014  . Groin pain 04/18/2013  . Tension headache 12/22/2012  . UNSPECIFIED RENAL SCLEROSIS 01/16/2009  . GERD, SEVERE 05/25/2007  . OBESITY NOS 04/03/2007  . HYPERLIPIDEMIA 08/31/2006  . Major depression, recurrent ([place]) 08/31/2006  . 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 Former smoker 08/31/2006  . HYPERTENSION, BENIGN SYSTEMIC 08/31/2006  . GASTRIC ULCER ACUTE WITHOUT HEMORRHAGE 08/31/2006  . CONVULSIONS, SEIZURES, NOS 08/31/2006    Past Surgical History:  Procedure Laterality Date  . ABDOMINAL HYSTERECTOMY    . ANTERIOR HIP REVISION Left 07/25/2016   Procedure: LEFT HIP IRRIGATION AND DEBRIDEMENT, REVISION OF HEAD AND LINER;  Surgeon: Leandrew Koyanagi, MD;  Location: [facility];  Service: Orthopedics;  Laterality: Left;  . CHOLECYSTECTOMY    . INCISION AND DRAINAGE HIP Left 08/03/2016   Procedure: IRRIGATION AND DEBRIDEMENT LEFT HIP, VAC PLACEMENT;  Surgeon: Leandrew Koyanagi, MD;  Location: [facility];  Service: Orthopedics;  Laterality: Left;  . kidney stent Right 2002  . TONSILLECTOMY Bilateral 12/06/2014   Procedure: TONSILLECTOMY, BILATERAL;  Surgeon: Ruby Cola, MD;  Location: [facility];  Service: ENT;  Laterality: Bilateral;  .  TONSILLECTOMY    . TOTAL HIP ARTHROPLASTY Left 06/01/2016   Procedure: LEFT TOTAL HIP ARTHROPLASTY ANTERIOR APPROACH;  Surgeon: Leandrew Koyanagi, MD;  Location: [facility];  Service: Orthopedics;  Laterality: Left;  . TUBAL LIGATION      OB History    No data available       Home Medications    Prior to Admission medications   Medication Sig Start Date End Date Taking? Authorizing Provider  acetaminophen (TYLENOL) 500 MG tablet Take 500-1,000 mg by mouth every 6 (six) hours as needed for headache (or pain).     Historical Provider, MD  amitriptyline (ELAVIL) 75 MG tablet Take 75 mg by mouth 2 (two) times daily.    Historical Provider, MD  aspirin EC 325 MG tablet Take 1 tablet (325 mg total) by mouth 2 (two) times daily. Patient not taking: Reported on 07/25/2016 06/01/16   Leandrew Koyanagi, MD  cyclobenzaprine (FLEXERIL) 10 MG tablet Take 1 tablet (10 mg total) by mouth 3 (three) times daily as needed for muscle spasms. 01/07/16   Smiley Houseman, MD  desonide (DESOWEN) 0.05 % ointment Apply 1 application topically 2 (two) times daily. Patient not taking: Reported on 07/25/2016 07/05/16   Leandrew Koyanagi, MD  hydrochlorothiazide (HYDRODIURIL) 25 MG tablet Take 1 tablet (25 mg total) by mouth daily. 04/07/16   Elizabeth Woodland Mumaw, DO  lovastatin (MEVACOR) 40 MG tablet Take 1 tablet (40 mg total) by mouth at bedtime. 01/28/16   Smiley Houseman, MD  methocarbamol (ROBAXIN) 750 MG tablet Take 1 tablet (750 mg total) by mouth 2 (two) times daily as needed for muscle spasms. Patient not taking: Reported on 07/25/2016 06/01/16   Leandrew Koyanagi, MD  metoprolol tartrate (LOPRESSOR) 25 MG tablet TAKE ONE TABLET BY MOUTH TWICE DAILY 08/08/16   Smiley Houseman, MD  naproxen (NAPROSYN) 500 MG tablet TAKE ONE TABLET BY MOUTH TWICE DAILY BETWEEN  MEALS  AS  NEEDED  FOR  MILD  PAIN,  MODERATE  PAIN OR  HEADACHE. Take with food Patient not taking: Reported on 07/25/2016 05/20/16   Smiley Houseman, MD  omeprazole (PRILOSEC) 20 MG capsule Take 1 capsule (20 mg total) by mouth daily. Patient taking differently: Take 20 mg by mouth every evening.  01/07/16   Smiley Houseman, MD  omeprazole (PRILOSEC) 20 MG capsule TAKE ONE CAPSULE BY MOUTH ONCE DAILY AS NEEDED Patient not taking: Reported on 07/25/2016 06/01/16   Smiley Houseman, MD  ondansetron (ZOFRAN) 4 MG tablet Take 1-2 tablets (4-8 mg total) by mouth every 8 (eight) hours as needed for nausea or vomiting. Patient not taking: Reported on 07/25/2016 06/01/16    Leandrew Koyanagi, MD  oxyCODONE (OXYCONTIN) 10 mg 12 hr tablet Take 1 tablet (10 mg total) by mouth every 12 (twelve) hours. Patient not taking: Reported on 07/25/2016 06/01/16   Leandrew Koyanagi, MD  oxyCODONE-acetaminophen (PERCOCET) 5-325 MG tablet Take 1-2 tablets by mouth every 4 (four) hours as needed for severe pain. Patient not taking: Reported on 07/25/2016 06/01/16   Leandrew Koyanagi, MD  oxyCODONE-acetaminophen (PERCOCET) 5-325 MG tablet Take 1-2 tablets by mouth every 4 (four) hours as needed for severe pain. 08/03/16   Naiping Ephriam Jenkins, MD  oxyCODONE-acetaminophen (PERCOCET/ROXICET) 5-325 MG tablet Take 1-2 tablets by mouth every 4 (four) hours as needed for severe pain. 07/19/16   Meredith Pel, MD  promethazine (PHENERGAN) 25 MG tablet Take 1 tablet (25 mg total) by mouth every  6 (six) hours as needed for nausea. Patient not taking: Reported on 07/25/2016 06/01/16   Leandrew Koyanagi, MD  promethazine (PHENERGAN) 25 MG tablet Take 1 tablet (25 mg total) by mouth every 6 (six) hours as needed for nausea. 08/03/16   Naiping Ephriam Jenkins, MD  senna-docusate (SENOKOT S) 8.6-50 MG tablet Take 1 tablet by mouth at bedtime as needed. 08/03/16   Naiping Ephriam Jenkins, MD  vancomycin (VANCOCIN) 1-5 GM/200ML-% SOLN Inject 200 mLs (1,000 mg total) into the vein every 12 (twelve) hours. 08/03/16 11/19/16  Leandrew Koyanagi, MD  vancomycin IVPB Inject 1,000 mg into the vein every 12 (twelve) hours. Indication:  Prosthetic joint infection Last Day of Therapy:  09/04/16 Labs - Sunday/Monday:  CBC/D, BMP, and vancomycin trough. Labs - Thursday:  BMP and vancomycin trough Labs - Every other week:  ESR and CRP 08/03/16 09/10/16  Naiping Ephriam Jenkins, MD    Family History Family History  Problem Relation Age of Onset  . Other Mother     failed surgery  . Heart Problems Father     poss MI, not sure:per pt  . Alzheimer's disease Maternal Grandmother   . Heart disease Maternal Grandfather   . Colon cancer Maternal Uncle     not sure age of onset     Social History Social History  Substance Use Topics  . Smoking status: Former Smoker    Packs/day: 0.25    Years: 0.50    Types: Cigarettes    Quit date: 01/27/2014  . Smokeless tobacco: Never Used  . Alcohol use No     Allergies   Fluconazole and Robitussin (alcohol free) [guaifenesin]   Review of Systems Review of Systems  Constitutional:       PICC line clogged  All other systems reviewed and are negative.    Physical Exam Updated Vital Signs BP 118/66 (BP Location: Left Arm)   Pulse 97   Temp 98.5 F (36.9 C) (Oral)   Resp 14   Ht _0  (1.575 m)   Wt 119.2 kg   SpO2 100%   BMI 48.07 kg/m   Physical Exam  Constitutional: She is oriented to person, place, and time. She appears well-developed and well-nourished.  HENT:  Head: Normocephalic and atraumatic.  Mouth/Throat: Oropharynx is clear and moist.  Eyes: Conjunctivae and EOM are normal. Pupils are equal, round, and reactive to light.  Neck: Normal range of motion.  Cardiovascular: Normal rate, regular rhythm and normal heart sounds.   Pulmonary/Chest: Effort normal and breath sounds normal. No respiratory distress. She has no wheezes.  Abdominal: Soft. Bowel sounds are normal.  Musculoskeletal: Normal range of motion.  PICC line present in right upper arm, no surrounding redness, swelling, or drainage, line does appear to have clot present  Neurological: She is alert and oriented to person, place, and time.  Skin: Skin is warm and dry.  Psychiatric: She has a normal mood and affect.  Nursing note and vitals reviewed.    ED Treatments / Results  Labs (all labs ordered are listed, but only abnormal results are displayed) Labs Reviewed - No data to display  EKG  EKG Interpretation None       Radiology No results found.  Procedures Procedures (including critical care time)  Medications Ordered in ED Medications  oxyCODONE-acetaminophen (PERCOCET/ROXICET) 5-325 MG per tablet 2 tablet  (2 tablets Oral Given 08/08/16 1752)  heparin lock flush 100 unit/mL (250 Units Intracatheter Given 08/08/16 2053)  vancomycin (VANCOCIN) 1,500 mg in  sodium chloride 0.9 % 500 mL IVPB (0 mg Intravenous Stopped 08/08/16 2019)     Initial Impression / Assessment and Plan / ED Course  I have reviewed the triage vital signs and the nursing notes.  Pertinent labs & imaging results that were available during my care of the patient were reviewed by me and considered in my medical decision making (see chart for details).  61 year old female here with clogged right PICC line. Has this due to infected left hip prosthesis, receiving IV vanc.  Reports her home health RN noticed this today and therefore was unable to give her home dose of abx. Home health nurse encouraged her to come here. Here, PICC line in RUE with evidence of infection.  IV team was able to clear line with heparin lock flush.  Patient was given her daily dose of vanocymycin here.  Patient without further complaints.  Remains stable.  Will discharge home.  Return precautions given for any new or worsening symptoms.  Final Clinical Impressions(s) / ED Diagnoses   Final diagnoses:  Occluded PICC line, initial encounter [facility])     Larene Pickett, PA-C 08/08/16 2146    Merrily Pew, MD 08/09/16 [phone]

## 2016-08-08 NOTE — Discharge Instructions (Signed)
Return here If you have any issues with your PICC line.

## 2016-08-23 ENCOUNTER — Encounter: Payer: Self-pay | Admitting: Internal Medicine

## 2016-08-23 ENCOUNTER — Ambulatory Visit (INDEPENDENT_AMBULATORY_CARE_PROVIDER_SITE_OTHER): Payer: No Typology Code available for payment source | Admitting: Internal Medicine

## 2016-08-23 DIAGNOSIS — Z5181 Encounter for therapeutic drug level monitoring: Secondary | ICD-10-CM

## 2016-08-23 DIAGNOSIS — T8452XD Infection and inflammatory reaction due to internal left hip prosthesis, subsequent encounter: Secondary | ICD-10-CM

## 2016-08-23 DIAGNOSIS — R1032 Left lower quadrant pain: Secondary | ICD-10-CM

## 2016-08-23 MED ORDER — DOXYCYCLINE HYCLATE 100 MG PO TABS: 100.0000 mg | ORAL_TABLET | Freq: Two times a day (BID) | ORAL | 2 refills | Status: DC

## 2016-08-23 NOTE — Patient Instructions (Signed)
[facility] will pull your picc line after March 4th  Start Doxycycline 100 mg twice a day on March 5th

## 2016-08-23 NOTE — Progress Notes (Signed)
Per Dr Linus Salmons, relayed verbal order to pull PICC after last dose 3/4 to [place] at [facility].  Order repeated and verified. Landis Gandy, RN

## 2016-08-24 ENCOUNTER — Telehealth (INDEPENDENT_AMBULATORY_CARE_PROVIDER_SITE_OTHER): Payer: Self-pay | Admitting: Orthopaedic Surgery

## 2016-08-24 DIAGNOSIS — Z5181 Encounter for therapeutic drug level monitoring: Secondary | ICD-10-CM | POA: Insufficient documentation

## 2016-08-24 DIAGNOSIS — T8452XA Infection and inflammatory reaction due to internal left hip prosthesis, initial encounter: Secondary | ICD-10-CM | POA: Insufficient documentation

## 2016-08-24 NOTE — Progress Notes (Signed)
   Subjective:    Patient ID: Kristin [name], female    DOB: [birth year], 61 y.o.   MRN: [id number]  HPI Here for hsfu.    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  Initial surgery was in November 2017.  Underwent I and D 07/25/16 and pus noted.  Plan for vancomycin through March 4th.  Tolerating well, no issues with n/d.  No rash.  Had a dose adjustment based on trough level.  2 different cultures grew, tmp/smx and tetracycline sensitive.  Has VAC and closing well.     Review of Systems  Constitutional: Negative for fatigue.  Gastrointestinal: Negative for nausea.  Genitourinary: Negative for difficulty urinating.  Skin: Negative for rash.  Neurological: Negative for dizziness and light-headedness.       Objective:   Physical Exam  Constitutional: She appears well-developed and well-nourished. No distress.  Eyes: No scleral icterus.  Cardiovascular: Normal rate, regular rhythm and normal heart sounds.   Pulmonary/Chest: Effort normal and breath sounds normal.  Abdominal: Soft. She exhibits no distension.  Skin: No rash noted.    SH: former smoker      Assessment & Plan:

## 2016-08-24 NOTE — Telephone Encounter (Signed)
See message below °

## 2016-08-24 NOTE — Assessment & Plan Note (Signed)
Continued pain with vac change.  Recommended her pain medication prior to changing and she will discuss with her surgeon if any refills indicated.

## 2016-08-24 NOTE — Assessment & Plan Note (Signed)
Doing well. Less pain and overall improvement.  Will continue with vancomycin through the 4th of March and then use doxycycline for 3 months if everything is going well.  rtc 5-6 weeks

## 2016-08-24 NOTE — Assessment & Plan Note (Signed)
Creat ok, trough noted and has been adjusted.

## 2016-08-24 NOTE — Telephone Encounter (Signed)
Patient is requesting a refill of hydrocodone   Cb#: [phone]

## 2016-08-24 NOTE — Telephone Encounter (Signed)
Yes #30

## 2016-08-25 MED ORDER — HYDROCODONE-ACETAMINOPHEN 5-325 MG PO TABS: 1.0000 | ORAL_TABLET | Freq: Every day | ORAL | 0 refills | Status: DC

## 2016-08-25 NOTE — Telephone Encounter (Signed)
Rx ready for pick up. 

## 2016-08-25 NOTE — Telephone Encounter (Signed)
Pt aware will come by

## 2016-09-02 ENCOUNTER — Telehealth (INDEPENDENT_AMBULATORY_CARE_PROVIDER_SITE_OTHER): Payer: Self-pay | Admitting: Orthopaedic Surgery

## 2016-09-02 NOTE — Telephone Encounter (Signed)
Patient called advised she went to pick up her Rx for pain and there was also a Rx for an antibiotic, Patient said she is already taking an antibiotic. Patient asked she should be taking two antibiotics. The number to contact patient is [phone]

## 2016-09-05 ENCOUNTER — Telehealth: Payer: Self-pay | Admitting: *Deleted

## 2016-09-05 ENCOUNTER — Ambulatory Visit (INDEPENDENT_AMBULATORY_CARE_PROVIDER_SITE_OTHER): Payer: Self-pay | Admitting: Orthopaedic Surgery

## 2016-09-05 ENCOUNTER — Other Ambulatory Visit: Payer: Self-pay | Admitting: Family Medicine

## 2016-09-05 ENCOUNTER — Encounter (INDEPENDENT_AMBULATORY_CARE_PROVIDER_SITE_OTHER): Payer: Self-pay | Admitting: Orthopaedic Surgery

## 2016-09-05 DIAGNOSIS — M1612 Unilateral primary osteoarthritis, left hip: Secondary | ICD-10-CM

## 2016-09-05 DIAGNOSIS — I10 Essential (primary) hypertension: Secondary | ICD-10-CM

## 2016-09-05 DIAGNOSIS — Z96642 Presence of left artificial hip joint: Secondary | ICD-10-CM

## 2016-09-05 DIAGNOSIS — T8452XS Infection and inflammatory reaction due to internal left hip prosthesis, sequela: Secondary | ICD-10-CM

## 2016-09-05 MED ORDER — DOXYCYCLINE HYCLATE 100 MG PO TABS: 100.0000 mg | ORAL_TABLET | Freq: Two times a day (BID) | ORAL | 2 refills | Status: AC

## 2016-09-05 MED ORDER — DOXYCYCLINE HYCLATE 100 MG PO TABS: 100.0000 mg | ORAL_TABLET | Freq: Two times a day (BID) | ORAL | 2 refills | Status: DC

## 2016-09-05 NOTE — Telephone Encounter (Signed)
Patient called, asking if her doxy could be sent as generic, as she cannot afford the full price. Patient has orange card, has [facility] for primary care. She can use the [facility] Department for her prescription. It will be a $6 copay. Prescription called into  [facility] Department.  They will be closed Tuesday morning.  Patient updated. Landis Gandy, RN

## 2016-09-05 NOTE — Telephone Encounter (Signed)
Pt was seen in our office today.  

## 2016-09-05 NOTE — Progress Notes (Signed)
Kristin [name] follows up today for I&D of her infected hip. She is doing well with wound VAC changes. She is now finished her IV and bikes now on oral antibiotics. Her wound looks really good. There is good beefy granulation tissue. I changes of activity today under sterile conditions. Continue Monday Wednesday Friday VAC changes. I'll see her back in a month for another wound check and standing AP pelvis x-rays

## 2016-09-07 ENCOUNTER — Telehealth (INDEPENDENT_AMBULATORY_CARE_PROVIDER_SITE_OTHER): Payer: Self-pay | Admitting: Radiology

## 2016-09-07 NOTE — Telephone Encounter (Signed)
Please advise 

## 2016-09-07 NOTE — Telephone Encounter (Signed)
Called Pam[facility] Hurry to advise okay per [name] for social worker

## 2016-09-07 NOTE — Telephone Encounter (Signed)
Connye Burkitt with [facility] states that she called last week about having [name] send in a social worker consult for Ms. Tooley. She is currently being helped by [facility] financial assistance, but Pamala Hurry states a Education officer, museum could help get her set up with Medicaid and get some of the assistance that she needs. Please call back to advise.  Pamala Hurry [phone]

## 2016-09-07 NOTE — Telephone Encounter (Signed)
That's fine

## 2016-09-08 ENCOUNTER — Ambulatory Visit (INDEPENDENT_AMBULATORY_CARE_PROVIDER_SITE_OTHER): Payer: Self-pay | Admitting: Internal Medicine

## 2016-09-08 ENCOUNTER — Encounter: Payer: Self-pay | Admitting: Internal Medicine

## 2016-09-08 VITALS — BP 130/76 | HR 96 | Temp 98.3°F | Ht 62.0 in | Wt 250.0 lb

## 2016-09-08 DIAGNOSIS — D649 Anemia, unspecified: Secondary | ICD-10-CM

## 2016-09-08 DIAGNOSIS — R000 Tachycardia, unspecified: Secondary | ICD-10-CM

## 2016-09-08 DIAGNOSIS — E118 Type 2 diabetes mellitus with unspecified complications: Secondary | ICD-10-CM

## 2016-09-08 DIAGNOSIS — E785 Hyperlipidemia, unspecified: Secondary | ICD-10-CM

## 2016-09-08 LAB — CBC
HEMATOCRIT: 31.2 % — AB (ref 35.0–45.0)
Hemoglobin: 9.8 g/dL — ABNORMAL LOW (ref 11.7–15.5)
MCH: 27.1 pg (ref 27.0–33.0)
MCHC: 31.4 g/dL — AB (ref 32.0–36.0)
MCV: 86.2 fL (ref 80.0–100.0)
MPV: 9 fL (ref 7.5–12.5)
Platelets: 385 10*3/uL (ref 140–400)
RBC: 3.62 MIL/uL — ABNORMAL LOW (ref 3.80–5.10)
RDW: 14.3 % (ref 11.0–15.0)
WBC: 6.7 10*3/uL (ref 3.8–10.8)

## 2016-09-08 LAB — POCT GLYCOSYLATED HEMOGLOBIN (HGB A1C): Hemoglobin A1C: 5.8

## 2016-09-08 MED ORDER — FERROUS SULFATE 325 (65 FE) MG PO TABS: 325.0000 mg | ORAL_TABLET | Freq: Two times a day (BID) | ORAL | 0 refills | Status: DC

## 2016-09-08 MED ORDER — LOVASTATIN 40 MG PO TABS: 40.0000 mg | ORAL_TABLET | Freq: Every day | ORAL | 0 refills | Status: DC

## 2016-09-08 NOTE — Progress Notes (Signed)
   [facility] Phone: [phone]   Date of Visit: 09/08/2016   HPI:  Hospital Follow Up:  Kristin [name] underwent Left Total Hip Arthroplasty in 05/2016 but unfortunately had complications with an infected hip joint and underwent L hip irriagation and debridement with wound vac placement on 08/03/2016 and was on IV antibiotics. Per chart review, completed antibiotic on March 4th. Last seen by ortho on 3/5; per note, wound seems to be healing well; patient to continue wound vac changes and follow up in 1 month.  - she is doing well. She reports they transitioned her to PO antibiotic  - she has no issues with the wound vac - reports feeling tired. Per chart review, hemoglobin down after surgeries.   Tachycardia:  - her home health nurse reported that her pulse was fast sometimes when she checked it and asked to go see her PCP. Patient denies chest pain, palpitations, or shortness of breath. Per chart review, she was evaluated by cardiology for tachycardia. She had a holter monitor in 01/2015 which was unremarkable. Recommended continuing beta blocker   DM2:  - new diagnosis in 05/2016. At that time we discussed diet and lifestyle changes first - due to the above events she has not been able to exercise and closely watch her diet - denies polyuria, polydypsia, numbness/tingling of extremities, blurred vision  HLD: - reports she never started to take statin. No particular reason.    ROS: See HPI.  [place]:  PMH: HTN, HLD Hx of tobacco use Obesity GERD, severe Hx of Gastric Ulcer  Thyroid Nodule OA of Hip Depression  Unspecified Renal Sclerosis Hx of Adenomatous Polyp Intermittent Tachycardia  PHYSICAL EXAM: BP 130/76 (BP Location: Right Arm, Patient Position: Sitting, Cuff Size: Large)   Pulse 96   Temp 98.3 F (36.8 C) (Oral)   Ht 5\' 2"  (1.575 m)   Wt 250 lb (113.4 kg)   SpO2 96%   BMI 45.73 kg/m  GEN: NAD, pleasant  CV: RRR, no murmurs, rubs, or  gallops PULM: CTAB, normal effort SKIN: No rash or cyanosis; warm and well-perfused EXTR: No lower extremity edema or calf tenderness PSYCH: Mood and affect euthymic, normal rate and volume of speech NEURO: Awake, alert, no focal deficits grossly, normal speech  Diabetic Foot Exam - Simple   Simple Foot Form Diabetic Foot exam was performed with the following findings:  Yes 09/08/2016  8:58 AM  Visual Inspection No deformities, no ulcerations, no other skin breakdown bilaterally:  Yes Sensation Testing Intact to touch and monofilament testing bilaterally:  Yes Pulse Check Posterior Tibialis and Dorsalis pulse intact bilaterally:  Yes Comments     ASSESSMENT/PLAN: Health Maintenance: - information given for mammogram   Diabetes (HCC) A1c today improved and in pre-diabetes range. Discussed diet and lifestyle to control diabetes. Foot exam completed today. Up to date on PNA and Flu vaccines. Reminded of eye appointment  Sinus tachycardia Asymptomatic. Continue beta blocker.   HLD:  - start Lovastatin - lipid panel due in 01/2017  Hospital Follow up: - doing well. Has follow up  - per chart review, anemia after surgery. Will obtain cbc to ensure stable and/or improving. Will start on iron supplements   Follow up 3 months or sooner   Smiley Houseman, MD PGY [place]

## 2016-09-08 NOTE — Patient Instructions (Addendum)
Please visit [email or web address] for more information on healthy eating.  Start taking Lovastatin Also take Iron supplements Follow up in 3 months or sooner if you have concerns   Diet Recommendations for Diabetes   Starchy (carb) foods include: Bread, rice, pasta, potatoes, corn, crackers, bagels, muffins, all baked goods.  (Fruits, milk, and yogurt also have carbohydrate, but most of these foods will not spike your blood sugar as the starchy foods will.)  A few fruits do cause high blood sugars; use small portions of bananas (limit to 1/2 at a time), grapes, and most tropical fruits.    Protein foods include: Meat, fish, poultry, eggs, dairy foods, and beans such as pinto and kidney beans (beans also provide carbohydrate).   1. Eat at least 3 meals and 1-2 snacks per day. Never go more than 4-5 hours while awake without eating.  2. Limit starchy foods to TWO per meal and ONE per snack. ONE portion of a starchy  food is equal to the following:   - ONE slice of bread (or its equivalent, such as half of a hamburger bun).   - 1/2 cup of a "scoopable" starchy food such as potatoes or rice.   - 15 grams of carbohydrate as shown on food label.  3. Both lunch and dinner should include a protein food, a carb food, and vegetables.   - Obtain twice as many veg's as protein or carbohydrate foods for both lunch and dinner.   - Fresh or frozen veg's are best.   - Try to keep frozen veg's on hand for a quick vegetable serving.    4. Breakfast should always include protein.

## 2016-09-09 LAB — MICROALBUMIN / CREATININE URINE RATIO
CREATININE, URINE: 195 mg/dL (ref 20–320)
Microalb Creat Ratio: 4 mcg/mg creat (ref <30)
Microalb, Ur: 0.7 mg/dL

## 2016-09-11 NOTE — Assessment & Plan Note (Addendum)
A1c today improved and in pre-diabetes range. Discussed diet and lifestyle to control diabetes. Foot exam completed today. Up to date on PNA and Flu vaccines. Reminded of eye appointment. Urine microalbumin/cr ratio today .

## 2016-09-11 NOTE — Assessment & Plan Note (Signed)
Asymptomatic.  Continue beta blocker.

## 2016-09-14 NOTE — Telephone Encounter (Signed)
2nd request.  Martin, Tamika L, RN  

## 2016-09-15 ENCOUNTER — Other Ambulatory Visit: Payer: Self-pay | Admitting: *Deleted

## 2016-09-15 DIAGNOSIS — I10 Essential (primary) hypertension: Secondary | ICD-10-CM

## 2016-09-15 MED ORDER — HYDROCHLOROTHIAZIDE 25 MG PO TABS: 25.0000 mg | ORAL_TABLET | Freq: Every day | ORAL | 0 refills | Status: DC

## 2016-09-28 ENCOUNTER — Telehealth: Payer: Self-pay | Admitting: Internal Medicine

## 2016-09-28 NOTE — Telephone Encounter (Signed)
Pt's heart rate today is 100. ep

## 2016-09-28 NOTE — Telephone Encounter (Signed)
Spoke with patient's home health nurse [place].  Patient's heart rate was 100 today and BP 140/82.  Patient is asymptotic.  Also spoke with patient, she is doing ok, no concerns.  Will forward to PCP.  Derl Barrow, RN

## 2016-09-28 NOTE — Telephone Encounter (Signed)
Patient has a history of tachycardia and was evaluated by cardiology without any issues. Hr is okay today. Per nursing patient reports she is asymptomatic. Can follow up as we planned initially or sooner if patient has any concerns.

## 2016-10-03 ENCOUNTER — Ambulatory Visit (INDEPENDENT_AMBULATORY_CARE_PROVIDER_SITE_OTHER): Payer: Self-pay | Admitting: Internal Medicine

## 2016-10-03 ENCOUNTER — Encounter: Payer: Self-pay | Admitting: Internal Medicine

## 2016-10-03 VITALS — BP 134/84 | HR 103 | Temp 98.5°F | Ht 62.0 in | Wt 254.0 lb

## 2016-10-03 DIAGNOSIS — Z5181 Encounter for therapeutic drug level monitoring: Secondary | ICD-10-CM

## 2016-10-03 DIAGNOSIS — S81802S Unspecified open wound, left lower leg, sequela: Secondary | ICD-10-CM | POA: Insufficient documentation

## 2016-10-03 DIAGNOSIS — T8452XD Infection and inflammatory reaction due to internal left hip prosthesis, subsequent encounter: Secondary | ICD-10-CM

## 2016-10-03 LAB — CBC
HCT: 31 % — ABNORMAL LOW (ref 35.0–45.0)
Hemoglobin: 9.9 g/dL — ABNORMAL LOW (ref 11.7–15.5)
MCH: 27 pg (ref 27.0–33.0)
MCHC: 31.9 g/dL — ABNORMAL LOW (ref 32.0–36.0)
MCV: 84.5 fL (ref 80.0–100.0)
MPV: 8.7 fL (ref 7.5–12.5)
PLATELETS: 316 10*3/uL (ref 140–400)
RBC: 3.67 MIL/uL — AB (ref 3.80–5.10)
RDW: 14.8 % (ref 11.0–15.0)
WBC: 6.4 10*3/uL (ref 3.8–10.8)

## 2016-10-03 LAB — COMPREHENSIVE METABOLIC PANEL
ALT: 14 U/L (ref 6–29)
AST: 14 U/L (ref 10–35)
Albumin: 3.7 g/dL (ref 3.6–5.1)
Alkaline Phosphatase: 146 U/L — ABNORMAL HIGH (ref 33–130)
BUN: 16 mg/dL (ref 7–25)
CHLORIDE: 99 mmol/L (ref 98–110)
CO2: 33 mmol/L — AB (ref 20–31)
CREATININE: 1.01 mg/dL (ref 0.50–1.05)
Calcium: 9.8 mg/dL (ref 8.6–10.4)
Glucose, Bld: 115 mg/dL — ABNORMAL HIGH (ref 65–99)
Potassium: 3.7 mmol/L (ref 3.5–5.3)
SODIUM: 139 mmol/L (ref 135–146)
TOTAL PROTEIN: 7.2 g/dL (ref 6.1–8.1)
Total Bilirubin: 0.3 mg/dL (ref 0.2–1.2)

## 2016-10-03 LAB — SEDIMENTATION RATE: SED RATE: 73 mm/h — AB (ref 0–30)

## 2016-10-03 NOTE — Progress Notes (Signed)
   Subjective:    Patient ID: Kristin [name], female    DOB: 1955/08/20, 61 y.o.   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  Initial surgery was in November 2017.  She underwent I and D 07/25/16 with [name] and pus noted.  She was treated with vancomycin through March 4th and had her picc line removed and started on doxycycline.  No rash. 2 different cultures grew, tmp/smx and tetracycline sensitive.  Has VAC and continues to be closing well.  No yeast infection, no trouble with swallowing pill.  No rashes.    Review of Systems  Gastrointestinal: Negative for diarrhea and nausea.  Skin: Negative for rash.  Neurological: Negative for dizziness and light-headedness.       Objective:   Physical Exam  Constitutional: She appears well-developed and well-nourished. No distress.  Eyes: No scleral icterus.  Cardiovascular: Normal rate, regular rhythm and normal heart sounds.   Pulmonary/Chest: Effort normal and breath sounds normal.  Musculoskeletal:  Slow moving with hip but able to walk.  VAC in place  Neurological: She is alert.  Skin: No rash noted.    SH: former smoker      Assessment & Plan:

## 2016-10-03 NOTE — Assessment & Plan Note (Signed)
Will check cbc and cmp today 

## 2016-10-03 NOTE — Assessment & Plan Note (Signed)
She is continuing with the wound VAC and is improving.  Discussed protein intake and that it likely will take several months to heal.  Managed by surgery.

## 2016-10-03 NOTE — Assessment & Plan Note (Signed)
Doing well.  Will check inflammatory markers today and rtc 2 months and will consider completing oral doxycycline then if she continues to do well.

## 2016-10-04 LAB — C-REACTIVE PROTEIN: CRP: 50.9 mg/L — ABNORMAL HIGH (ref <8.0)

## 2016-10-05 ENCOUNTER — Other Ambulatory Visit: Payer: Self-pay | Admitting: Internal Medicine

## 2016-10-06 ENCOUNTER — Other Ambulatory Visit: Payer: Self-pay | Admitting: Internal Medicine

## 2016-10-10 ENCOUNTER — Encounter (INDEPENDENT_AMBULATORY_CARE_PROVIDER_SITE_OTHER): Payer: Self-pay | Admitting: Orthopaedic Surgery

## 2016-10-10 ENCOUNTER — Ambulatory Visit (INDEPENDENT_AMBULATORY_CARE_PROVIDER_SITE_OTHER): Payer: Self-pay

## 2016-10-10 ENCOUNTER — Ambulatory Visit (INDEPENDENT_AMBULATORY_CARE_PROVIDER_SITE_OTHER): Payer: Self-pay | Admitting: Orthopaedic Surgery

## 2016-10-10 DIAGNOSIS — M1612 Unilateral primary osteoarthritis, left hip: Secondary | ICD-10-CM

## 2016-10-10 NOTE — Progress Notes (Addendum)
The patient is almost 10 weeks status post irrigation and debridement with revision of her head and poly-exchange for deep infection. She has been on a wound VAC since that time. She is currently on oral doxycycline. She is doing well. Her pain is improving. Her function and activity level has improved also. Her x-ray show stable alignment. Her wound is significantly improved today. She has a shallow wound with beefy red granulation tissue without any evidence of complications. At this point we can discontinue the wound VAC and begin wet-to-dry dressings twice a day until healed. I'll see her back in 4 weeks for recheck of her wound. No x-rays needed. Her recent CRP and ESR both elevated on 10/03/2016. I will like her to continue antibiotics until these indices have normalized.

## 2016-10-12 ENCOUNTER — Telehealth (INDEPENDENT_AMBULATORY_CARE_PROVIDER_SITE_OTHER): Payer: Self-pay | Admitting: Orthopaedic Surgery

## 2016-10-12 NOTE — Telephone Encounter (Signed)
Olivia Mackie from [facility] called wanting to tell [name] that the patient is doing well and she is being discharged. Patient also wanted to know if she could shower without covering up the wound? CB # [phone]

## 2016-10-13 NOTE — Telephone Encounter (Signed)
Please advise 

## 2016-10-13 NOTE — Telephone Encounter (Signed)
Called tracey back to let her know

## 2016-10-13 NOTE — Telephone Encounter (Signed)
Ok.  Yes she can.  Wash with gentle soap.  Pad dry

## 2016-10-19 ENCOUNTER — Other Ambulatory Visit: Payer: Self-pay | Admitting: *Deleted

## 2016-10-20 MED ORDER — FERROUS SULFATE 325 (65 FE) MG PO TABS: 325.0000 mg | ORAL_TABLET | Freq: Two times a day (BID) | ORAL | 0 refills | Status: DC

## 2016-10-24 ENCOUNTER — Ambulatory Visit: Payer: Self-pay

## 2016-11-07 ENCOUNTER — Ambulatory Visit (INDEPENDENT_AMBULATORY_CARE_PROVIDER_SITE_OTHER): Payer: Self-pay | Admitting: Orthopaedic Surgery

## 2016-11-07 DIAGNOSIS — Z96642 Presence of left artificial hip joint: Secondary | ICD-10-CM

## 2016-11-07 MED ORDER — MUPIROCIN 2 % EX OINT: TOPICAL_OINTMENT | CUTANEOUS | 0 refills | Status: DC

## 2016-11-07 NOTE — Progress Notes (Signed)
Office Visit Note   Patient: Kristin [name]           Date of Birth: [birth year]           MRN: [id number] Visit Date: 11/07/2016              Requested by: Smiley Houseman, [address], [place] [address] PCP: Smiley Houseman, MD   Assessment & Plan: Visit Diagnoses:  1. Presence of left artificial hip joint     Plan: At this point she can stop dries and do Bactroban ointment twice a day. Continue oral antibiotics per infectious disease clinic I'll like to see her back in a month for standing AP pelvis x-rays and wound check.  Follow-Up Instructions: Return in about 1 month (around 12/08/2016).   Orders:  No orders of the defined types were placed in this encounter.  No orders of the defined types were placed in this encounter.     Procedures: No procedures performed   Clinical Data: No additional findings.   Subjective: Chief Complaint  Patient presents with  . Left Hip - Follow-up    Patient is 5 months status post left total knee replacement and approximately a little over 3 months status post washout for prosthetic joint infection. She is currently on oral antibiotics. She is managed by Dr. Linus Salmons.  She is overall doing well. She endorses some stiffness in her hip. She denies any pain or constitutional symptoms.    Review of Systems   Objective: Vital Signs: There were no vitals taken for this visit.  Physical Exam  Ortho Exam Left hip exam shows essentially a healed surgical wound. She has 2 small areas with good granulation tissue. Specialty Comments:  No specialty comments available.  Imaging: No results found.   PMFS History: Patient Active Problem List   Diagnosis Date Noted  . Presence of left artificial hip joint 11/07/2016  . Wound of left leg, sequela 10/03/2016  . 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 Medication monitoring encounter 08/24/2016  . Status post left hip replacement 06/01/2016    . Sinus tachycardia 05/19/2016  . History of adenomatous polyp of colon 03/21/2016  . Diabetes ([place]) 01/21/2016  . Thyroid nodule 12/25/2014  . Infiltrate noted on imaging study   . Cervical spine arthritis ([place]) 11/14/2014  . Primary osteoarthritis of left hip 11/14/2014  . Loss of consciousness ([place]) 11/14/2014  . Musculoskeletal chest and rib pain 03/26/2014  . Rib pain on right side 02/28/2014  . Groin pain 04/18/2013  . UNSPECIFIED RENAL SCLEROSIS 01/16/2009  . GERD, SEVERE 05/25/2007  . OBESITY NOS 04/03/2007  . HYPERLIPIDEMIA 08/31/2006  . Major depression, recurrent ([place]) 08/31/2006  . 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 Former smoker 08/31/2006  . HYPERTENSION, BENIGN SYSTEMIC 08/31/2006  . GASTRIC ULCER ACUTE WITHOUT HEMORRHAGE 08/31/2006  . CONVULSIONS, SEIZURES, NOS 08/31/2006   Past Medical History:  Diagnosis Date  . Allergy   . Anemia   . Anxiety   . Arthritis   . Back pain, chronic   . Bronchitis   . Cough   . Depression   . GERD (gastroesophageal reflux disease)   . Hyperlipidemia   . Hypertension   . Obesity   . 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 Seizures ([place])    last >10 yrs ago  . Somatization disorder   . Tobacco abuse     Family History  Problem Relation Age  of Onset  . Other Mother     failed surgery  . Heart Problems Father     poss MI, not sure:per pt  . Alzheimer's disease Maternal Grandmother   . Heart disease Maternal Grandfather   . Colon cancer Maternal Uncle     not sure age of onset    Past Surgical History:  Procedure Laterality Date  . ABDOMINAL HYSTERECTOMY    . ANTERIOR HIP REVISION Left 07/25/2016   Procedure: LEFT HIP IRRIGATION AND DEBRIDEMENT, REVISION OF HEAD AND LINER;  Surgeon: Leandrew Koyanagi, MD;  Location: [facility];  Service: Orthopedics;  Laterality: Left;  . CHOLECYSTECTOMY    . INCISION AND DRAINAGE HIP Left 08/03/2016   Procedure: IRRIGATION AND DEBRIDEMENT LEFT HIP, VAC PLACEMENT;  Surgeon:  Leandrew Koyanagi, MD;  Location: [facility];  Service: Orthopedics;  Laterality: Left;  . kidney stent Right 2002  . TONSILLECTOMY Bilateral 12/06/2014   Procedure: TONSILLECTOMY, BILATERAL;  Surgeon: Ruby Cola, MD;  Location: [facility];  Service: ENT;  Laterality: Bilateral;  . TONSILLECTOMY    . TOTAL HIP ARTHROPLASTY Left 06/01/2016   Procedure: LEFT TOTAL HIP ARTHROPLASTY ANTERIOR APPROACH;  Surgeon: Leandrew Koyanagi, MD;  Location: [facility];  Service: Orthopedics;  Laterality: Left;  . TUBAL LIGATION     Social History   Occupational History  . Not on file.   Social History Main Topics  . Smoking status: Former Smoker    Packs/day: 0.25    Years: 0.50    Types: Cigarettes    Quit date: 01/27/2014  . Smokeless tobacco: Never Used  . Alcohol use No  . Drug use: No  . Sexual activity: Not on file

## 2016-11-08 ENCOUNTER — Ambulatory Visit (INDEPENDENT_AMBULATORY_CARE_PROVIDER_SITE_OTHER): Payer: Self-pay | Admitting: Orthopaedic Surgery

## 2016-11-08 ENCOUNTER — Ambulatory Visit (INDEPENDENT_AMBULATORY_CARE_PROVIDER_SITE_OTHER): Payer: Self-pay

## 2016-11-08 DIAGNOSIS — M1612 Unilateral primary osteoarthritis, left hip: Secondary | ICD-10-CM

## 2016-11-08 NOTE — Progress Notes (Signed)
Office Visit Note   Patient: Kristin [name]           Date of Birth: 05/24/1956           MRN: [id number] Visit Date: 11/08/2016              Requested by: Smiley Houseman, [address] [place], [place] [address] PCP: Smiley Houseman, MD   Assessment & Plan: Visit Diagnoses:  1. Primary osteoarthritis of left hip     Plan: X-rays confirm a reduced hip. I think she may have subluxed it when she bent over. I want her to take this easy and to adhere to posterior hip precautions. I recommended a grabber to help pick up things from the floor. Follow-up with me as scheduled  Follow-Up Instructions: Return if symptoms worsen or fail to improve.   Orders:  Orders Placed This Encounter  Procedures  . XR HIP UNILAT W OR W/O PELVIS 2-3 VIEWS LEFT   No orders of the defined types were placed in this encounter.     Procedures: No procedures performed   Clinical Data: No additional findings.   Subjective: No chief complaint on file.   Patient comes in today with acute left hip pain status post pinning over to pick up something when she felt a pop. She's had difficulty with weightbearing.    Review of Systems  Constitutional: Negative.   HENT: Negative.   Eyes: Negative.   Respiratory: Negative.   Cardiovascular: Negative.   Endocrine: Negative.   Musculoskeletal: Negative.   Neurological: Negative.   Hematological: Negative.   Psychiatric/Behavioral: Negative.   All other systems reviewed and are negative.    Objective: Vital Signs: There were no vitals taken for this visit.  Physical Exam  Constitutional: She is oriented to person, place, and time. She appears well-developed and well-nourished.  Pulmonary/Chest: Effort normal.  Neurological: She is alert and oriented to person, place, and time.  Skin: Skin is warm. Capillary refill takes less than 2 seconds.  Psychiatric: She has a normal mood and affect. Her behavior is normal. Judgment and  thought content normal.  Nursing note and vitals reviewed.   Ortho Exam Left hip exam shows good range of motion but with pain and discomfort. Leg lengths are at baseline. Specialty Comments:  No specialty comments available.  Imaging: Xr Hip Unilat W Or W/o Pelvis 2-3 Views Left  Result Date: 11/08/2016 Stable left total hip replacement with a well located joint    PMFS History: Patient Active Problem List   Diagnosis Date Noted  . Presence of left artificial hip joint 11/07/2016  . Wound of left leg, sequela 10/03/2016  . 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 Medication monitoring encounter 08/24/2016  . Status post left hip replacement 06/01/2016  . Sinus tachycardia 05/19/2016  . History of adenomatous polyp of colon 03/21/2016  . Diabetes ([place]) 01/21/2016  . Thyroid nodule 12/25/2014  . Infiltrate noted on imaging study   . 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 Primary osteoarthritis of left hip 11/14/2014  . Loss of consciousness ([place]) 11/14/2014  . Musculoskeletal chest and rib pain 03/26/2014  . Rib pain on right side 02/28/2014  . Groin pain 04/18/2013  . UNSPECIFIED RENAL SCLEROSIS 01/16/2009  . GERD, SEVERE 05/25/2007  . OBESITY NOS 04/03/2007  . HYPERLIPIDEMIA 08/31/2006  . Major depression, recurrent ([place]) 08/31/2006  . 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 Former smoker 08/31/2006  . HYPERTENSION, BENIGN SYSTEMIC  08/31/2006  . GASTRIC ULCER ACUTE WITHOUT HEMORRHAGE 08/31/2006  . CONVULSIONS, SEIZURES, NOS 08/31/2006   Past Medical History:  Diagnosis Date  . Allergy   . Anemia   . Anxiety   . Arthritis   . Back pain, chronic   . Bronchitis   . Cough   . Depression   . GERD (gastroesophageal reflux disease)   . Hyperlipidemia   . Hypertension   . Obesity   . 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 Seizures ([place])    last >10 yrs ago  . Somatization disorder   . Tobacco abuse     Family History    Problem Relation Age of Onset  . Other Mother     failed surgery  . Heart Problems Father     poss MI, not sure:per pt  . Alzheimer's disease Maternal Grandmother   . Heart disease Maternal Grandfather   . Colon cancer Maternal Uncle     not sure age of onset    Past Surgical History:  Procedure Laterality Date  . ABDOMINAL HYSTERECTOMY    . ANTERIOR HIP REVISION Left 07/25/2016   Procedure: LEFT HIP IRRIGATION AND DEBRIDEMENT, REVISION OF HEAD AND LINER;  Surgeon: Leandrew Koyanagi, MD;  Location: [facility];  Service: Orthopedics;  Laterality: Left;  . CHOLECYSTECTOMY    . INCISION AND DRAINAGE HIP Left 08/03/2016   Procedure: IRRIGATION AND DEBRIDEMENT LEFT HIP, VAC PLACEMENT;  Surgeon: Leandrew Koyanagi, MD;  Location: [facility];  Service: Orthopedics;  Laterality: Left;  . kidney stent Right 2002  . TONSILLECTOMY Bilateral 12/06/2014   Procedure: TONSILLECTOMY, BILATERAL;  Surgeon: Ruby Cola, MD;  Location: [facility];  Service: ENT;  Laterality: Bilateral;  . TONSILLECTOMY    . TOTAL HIP ARTHROPLASTY Left 06/01/2016   Procedure: LEFT TOTAL HIP ARTHROPLASTY ANTERIOR APPROACH;  Surgeon: Leandrew Koyanagi, MD;  Location: [facility];  Service: Orthopedics;  Laterality: Left;  . TUBAL LIGATION     Social History   Occupational History  . Not on file.   Social History Main Topics  . Smoking status: Former Smoker    Packs/day: 0.25    Years: 0.50    Types: Cigarettes    Quit date: 01/27/2014  . Smokeless tobacco: Never Used  . Alcohol use No  . Drug use: No  . Sexual activity: Not on file

## 2016-11-23 ENCOUNTER — Other Ambulatory Visit: Payer: Self-pay | Admitting: *Deleted

## 2016-11-23 MED ORDER — FERROUS SULFATE 325 (65 FE) MG PO TABS: 325.0000 mg | ORAL_TABLET | Freq: Two times a day (BID) | ORAL | 3 refills | Status: DC

## 2016-11-29 ENCOUNTER — Other Ambulatory Visit: Payer: Self-pay | Admitting: Internal Medicine

## 2016-11-29 MED ORDER — FERROUS SULFATE 325 (65 FE) MG PO TABS: 325.0000 mg | ORAL_TABLET | Freq: Two times a day (BID) | ORAL | 3 refills | Status: DC

## 2016-11-29 NOTE — Telephone Encounter (Signed)
Pt would like to have her iron pill called in the health dept.  Health dept says they have received nothing from [place] about refilling this medication .

## 2016-12-05 ENCOUNTER — Encounter (INDEPENDENT_AMBULATORY_CARE_PROVIDER_SITE_OTHER): Payer: Self-pay | Admitting: Orthopaedic Surgery

## 2016-12-05 ENCOUNTER — Ambulatory Visit (INDEPENDENT_AMBULATORY_CARE_PROVIDER_SITE_OTHER): Payer: Self-pay | Admitting: Orthopaedic Surgery

## 2016-12-05 DIAGNOSIS — M1612 Unilateral primary osteoarthritis, left hip: Secondary | ICD-10-CM

## 2016-12-05 DIAGNOSIS — Z96642 Presence of left artificial hip joint: Secondary | ICD-10-CM

## 2016-12-05 NOTE — Addendum Note (Signed)
Addendum  created 12/05/16 1016 by Myrtie Soman, MD   Sign clinical note

## 2016-12-05 NOTE — Progress Notes (Signed)
Office Visit Note   Patient: Kristin [name]           Date of Birth: 09-23-1955           MRN: [id number] Visit Date: 12/05/2016              Requested by: Smiley Houseman, [address] [place], [place] [address] PCP: Smiley Houseman, MD   Assessment & Plan: Visit Diagnoses:  1. Primary osteoarthritis of left hip   2. Presence of left artificial hip joint     Plan: Patient's wound has healed up. Antibiotics duration per infectious disease. I did teach her about scar massage. I'll see her back in 2 months with standing AP pelvis.  Follow-Up Instructions: Return in about 2 months (around 02/04/2017).   Orders:  No orders of the defined types were placed in this encounter.  No orders of the defined types were placed in this encounter.     Procedures: No procedures performed   Clinical Data: No additional findings.   Subjective: Chief Complaint  Patient presents with  . Left Hip - Pain    Patient is 4 months status post left hip irrigation debridement for prosthetic hip infection. Her wound has healed up. She is still on oral antibiotics. She specifically see the infectious disease doctors soon.    Review of Systems   Objective: Vital Signs: There were no vitals taken for this visit.  Physical Exam  Ortho Exam Left hip exam shows a healed surgical scar. She has no drainage or any signs of infection. She is ambulating with a cane with an antalgic gait. Specialty Comments:  No specialty comments available.  Imaging: No results found.   PMFS History: Patient Active Problem List   Diagnosis Date Noted  . Presence of left artificial hip joint 11/07/2016  . Wound of left leg, sequela 10/03/2016  . 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 Medication monitoring encounter 08/24/2016  . Status post left hip replacement 06/01/2016  . Sinus tachycardia 05/19/2016  . History of adenomatous polyp of colon 03/21/2016  . Diabetes  ([place]) 01/21/2016  . Thyroid nodule 12/25/2014  . Infiltrate noted on imaging study   . 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 Primary osteoarthritis of left hip 11/14/2014  . Loss of consciousness ([place]) 11/14/2014  . Musculoskeletal chest and rib pain 03/26/2014  . Rib pain on right side 02/28/2014  . Groin pain 04/18/2013  . UNSPECIFIED RENAL SCLEROSIS 01/16/2009  . GERD, SEVERE 05/25/2007  . OBESITY NOS 04/03/2007  . HYPERLIPIDEMIA 08/31/2006  . Major depression, recurrent ([place]) 08/31/2006  . 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 Former smoker 08/31/2006  . HYPERTENSION, BENIGN SYSTEMIC 08/31/2006  . GASTRIC ULCER ACUTE WITHOUT HEMORRHAGE 08/31/2006  . CONVULSIONS, SEIZURES, NOS 08/31/2006   Past Medical History:  Diagnosis Date  . Allergy   . Anemia   . Anxiety   . Arthritis   . Back pain, chronic   . Bronchitis   . Cough   . Depression   . GERD (gastroesophageal reflux disease)   . Hyperlipidemia   . Hypertension   . Obesity   . 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 Seizures ([place])    last >10 yrs ago  . Somatization disorder   . Tobacco abuse     Family History  Problem Relation Age of Onset  . Other Mother        failed surgery  .  Heart Problems Father        poss MI, not sure:per pt  . Alzheimer's disease Maternal Grandmother   . Heart disease Maternal Grandfather   . Colon cancer Maternal Uncle        not sure age of onset    Past Surgical History:  Procedure Laterality Date  . ABDOMINAL HYSTERECTOMY    . ANTERIOR HIP REVISION Left 07/25/2016   Procedure: LEFT HIP IRRIGATION AND DEBRIDEMENT, REVISION OF HEAD AND LINER;  Surgeon: Leandrew Koyanagi, MD;  Location: [facility];  Service: Orthopedics;  Laterality: Left;  . CHOLECYSTECTOMY    . INCISION AND DRAINAGE HIP Left 08/03/2016   Procedure: IRRIGATION AND DEBRIDEMENT LEFT HIP, VAC PLACEMENT;  Surgeon: Leandrew Koyanagi, MD;  Location: [facility];  Service: Orthopedics;  Laterality: Left;  . kidney  stent Right 2002  . TONSILLECTOMY Bilateral 12/06/2014   Procedure: TONSILLECTOMY, BILATERAL;  Surgeon: Ruby Cola, MD;  Location: [facility];  Service: ENT;  Laterality: Bilateral;  . TONSILLECTOMY    . TOTAL HIP ARTHROPLASTY Left 06/01/2016   Procedure: LEFT TOTAL HIP ARTHROPLASTY ANTERIOR APPROACH;  Surgeon: Leandrew Koyanagi, MD;  Location: [facility];  Service: Orthopedics;  Laterality: Left;  . TUBAL LIGATION     Social History   Occupational History  . Not on file.   Social History Main Topics  . Smoking status: Former Smoker    Packs/day: 0.25    Years: 0.50    Types: Cigarettes    Quit date: 01/27/2014  . Smokeless tobacco: Never Used  . Alcohol use No  . Drug use: No  . Sexual activity: Not on file

## 2016-12-12 ENCOUNTER — Other Ambulatory Visit: Payer: Self-pay | Admitting: *Deleted

## 2016-12-12 NOTE — Telephone Encounter (Signed)
Next appt with Dr. Linus Salmons, 12/19/16.  Sending text message to Dr. Linus Salmons re: refill doxycycline.  RN spoke with Dr. Linus Salmons.  Dr. Linus Salmons gave a verbal order to "hold" the doxycycline until the patient is seen on Monday, December 19, 2016.  Notified pharmacy.

## 2016-12-19 ENCOUNTER — Encounter: Payer: Self-pay | Admitting: Internal Medicine

## 2016-12-19 ENCOUNTER — Ambulatory Visit (INDEPENDENT_AMBULATORY_CARE_PROVIDER_SITE_OTHER): Payer: Self-pay | Admitting: Internal Medicine

## 2016-12-19 DIAGNOSIS — Z5181 Encounter for therapeutic drug level monitoring: Secondary | ICD-10-CM

## 2016-12-19 DIAGNOSIS — T8452XD Infection and inflammatory reaction due to internal left hip prosthesis, subsequent encounter: Secondary | ICD-10-CM

## 2016-12-19 NOTE — Assessment & Plan Note (Signed)
Doing well now. Completed extended therapy with doxycycline.  Will observe off antibioitcs. She will call if she develops fever, chills associated with new hip pain.   rtc prn

## 2016-12-19 NOTE — Assessment & Plan Note (Signed)
No issues with medication, no yeast infections during treatment.

## 2016-12-19 NOTE — Progress Notes (Signed)
   Subjective:    Patient ID: Kristin [name], female    DOB: [birth year], 61 y.o.   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  Initial surgery was in November 2017.  She underwent I and D 07/25/16 with [name] and pus noted.  She was treated with vancomycin through March 4th and had her picc line removed and started on doxycycline.  No rash. 2 different cultures grew, tmp/smx and tetracycline sensitive. Overall continues to improve.  No vac now, walking though still some discomfort.  No associated n/v/d.  No fever or chills.  Finished doxycycline last week.     Review of Systems  Gastrointestinal: Negative for diarrhea and nausea.  Skin: Negative for rash.  Neurological: Negative for dizziness and light-headedness.       Objective:   Physical Exam  Constitutional: She appears well-developed and well-nourished. No distress.  Eyes: No scleral icterus.  Cardiovascular: Normal rate, regular rhythm and normal heart sounds.   Pulmonary/Chest: Effort normal and breath sounds normal.  Neurological: She is alert.  Skin: No rash noted.          Assessment & Plan:

## 2017-01-17 ENCOUNTER — Other Ambulatory Visit: Payer: Self-pay | Admitting: Internal Medicine

## 2017-01-17 NOTE — Telephone Encounter (Signed)
Attempted to contact pt to schedule an apt with pcp. However, someone other than her answered the phone, I left message to have her call me back. When pt calls please assist with scheduling her with pcp.

## 2017-01-17 NOTE — Telephone Encounter (Signed)
Please have patient make a follow up appointment with me.  

## 2017-01-30 ENCOUNTER — Other Ambulatory Visit: Payer: Self-pay | Admitting: Internal Medicine

## 2017-01-30 ENCOUNTER — Encounter (INDEPENDENT_AMBULATORY_CARE_PROVIDER_SITE_OTHER): Payer: Self-pay | Admitting: Orthopaedic Surgery

## 2017-01-30 ENCOUNTER — Ambulatory Visit (INDEPENDENT_AMBULATORY_CARE_PROVIDER_SITE_OTHER): Payer: Self-pay | Admitting: Orthopaedic Surgery

## 2017-01-30 ENCOUNTER — Ambulatory Visit (INDEPENDENT_AMBULATORY_CARE_PROVIDER_SITE_OTHER): Payer: Self-pay

## 2017-01-30 DIAGNOSIS — M1612 Unilateral primary osteoarthritis, left hip: Secondary | ICD-10-CM

## 2017-01-30 DIAGNOSIS — E785 Hyperlipidemia, unspecified: Secondary | ICD-10-CM

## 2017-01-30 NOTE — Progress Notes (Signed)
Office Visit Note   Patient: Kristin [name]           Date of Birth: 22-Jun-1956           MRN: [id number] Visit Date: 01/30/2017              Requested by: Smiley Houseman, [address] [place], [place] [id number] PCP: Smiley Houseman, MD   Assessment & Plan: Visit Diagnoses:  1. Primary osteoarthritis of left hip     Plan: Overall patient is doing well and progressing. Follow-up in 6 months for 1 year visit. Dental prophylaxis was reinforced. She'll need standing AP pelvis at that time.  Follow-Up Instructions: Return in about 6 months (around 08/02/2017).   Orders:  Orders Placed This Encounter  Procedures  . XR Pelvis 1-2 Views   No orders of the defined types were placed in this encounter.     Procedures: No procedures performed   Clinical Data: No additional findings.   Subjective: Chief Complaint  Patient presents with  . Left Hip - Pain, Follow-up    Kristin [name] is 6 months status post washout for a prosthetic joint infection. She comes back today for routine follow-up. She is overall doing better. She has no more drainage and the scar has healed up. She has occasional moderate pain she is walking with a cane. She does endorse some weakness of her left hip flexion. She takes Tylenol and Aleve occasionally.    Review of Systems   Objective: Vital Signs: There were no vitals taken for this visit.  Physical Exam  Ortho Exam Left hip exam shows a fully healed surgical scar. She has painless rotation of the hip. She is walking with a cane. Specialty Comments:  No specialty comments available.  Imaging: Xr Pelvis 1-2 Views  Result Date: 01/30/2017 Stable left total hip replacement.    PMFS History: Patient Active Problem List   Diagnosis Date Noted  . Presence of left artificial hip joint 11/07/2016  . Wound of left leg, sequela 10/03/2016  . 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[place]) 08/24/2016  . Medication monitoring encounter  08/24/2016  . Status post left hip replacement 06/01/2016  . Sinus tachycardia 05/19/2016  . History of adenomatous polyp of colon 03/21/2016  . Diabetes ([place]) 01/21/2016  . Thyroid nodule 12/25/2014  . Infiltrate noted on imaging study   . 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 Primary osteoarthritis of left hip 11/14/2014  . Loss of consciousness ([place]) 11/14/2014  . Musculoskeletal chest and rib pain 03/26/2014  . Rib pain on right side 02/28/2014  . Groin pain 04/18/2013  . UNSPECIFIED RENAL SCLEROSIS 01/16/2009  . GERD, SEVERE 05/25/2007  . OBESITY NOS 04/03/2007  . HYPERLIPIDEMIA 08/31/2006  . Major depression, recurrent ([place]) 08/31/2006  . 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 Former smoker 08/31/2006  . HYPERTENSION, BENIGN SYSTEMIC 08/31/2006  . GASTRIC ULCER ACUTE WITHOUT HEMORRHAGE 08/31/2006  . CONVULSIONS, SEIZURES, NOS 08/31/2006   Past Medical History:  Diagnosis Date  . Allergy   . Anemia   . Anxiety   . Arthritis   . Back pain, chronic   . Bronchitis   . Cough   . Depression   . GERD (gastroesophageal reflux disease)   . Hyperlipidemia   . Hypertension   . Obesity   . 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 Seizures ([place])    last >10 yrs ago  . Somatization disorder   . Tobacco  abuse     Family History  Problem Relation Age of Onset  . Other Mother        failed surgery  . Heart Problems Father        poss MI, not sure:per pt  . Alzheimer's disease Maternal Grandmother   . Heart disease Maternal Grandfather   . Colon cancer Maternal Uncle        not sure age of onset    Past Surgical History:  Procedure Laterality Date  . ABDOMINAL HYSTERECTOMY    . ANTERIOR HIP REVISION Left 07/25/2016   Procedure: LEFT HIP IRRIGATION AND DEBRIDEMENT, REVISION OF HEAD AND LINER;  Surgeon: Leandrew Koyanagi, MD;  Location: [facility];  Service: Orthopedics;  Laterality: Left;  . CHOLECYSTECTOMY    . INCISION AND DRAINAGE HIP Left 08/03/2016    Procedure: IRRIGATION AND DEBRIDEMENT LEFT HIP, VAC PLACEMENT;  Surgeon: Leandrew Koyanagi, MD;  Location: [facility];  Service: Orthopedics;  Laterality: Left;  . kidney stent Right 2002  . TONSILLECTOMY Bilateral 12/06/2014   Procedure: TONSILLECTOMY, BILATERAL;  Surgeon: Ruby Cola, MD;  Location: [facility];  Service: ENT;  Laterality: Bilateral;  . TONSILLECTOMY    . TOTAL HIP ARTHROPLASTY Left 06/01/2016   Procedure: LEFT TOTAL HIP ARTHROPLASTY ANTERIOR APPROACH;  Surgeon: Leandrew Koyanagi, MD;  Location: [facility];  Service: Orthopedics;  Laterality: Left;  . TUBAL LIGATION     Social History   Occupational History  . Not on file.   Social History Main Topics  . Smoking status: Former Smoker    Packs/day: 0.25    Years: 0.50    Types: Cigarettes    Quit date: 01/27/2014  . Smokeless tobacco: Never Used  . Alcohol use No  . Drug use: No  . Sexual activity: Not on file

## 2017-02-14 ENCOUNTER — Encounter: Payer: Self-pay | Admitting: Internal Medicine

## 2017-02-14 ENCOUNTER — Ambulatory Visit (INDEPENDENT_AMBULATORY_CARE_PROVIDER_SITE_OTHER): Payer: Self-pay | Admitting: Internal Medicine

## 2017-02-14 VITALS — BP 132/80 | HR 100 | Temp 98.8°F | Wt 259.0 lb

## 2017-02-14 DIAGNOSIS — M25532 Pain in left wrist: Secondary | ICD-10-CM

## 2017-02-14 MED ORDER — SIMVASTATIN 20 MG PO TABS: 20.0000 mg | ORAL_TABLET | Freq: Every day | ORAL | 3 refills | Status: DC

## 2017-02-14 NOTE — Patient Instructions (Addendum)
Let's try ice, Alleve, and wrist splint. I sent prescription for Simvastatin for your cholesterol since the other medication is expensive   De Quervain Tenosynovitis Tendons attach muscles to bones. They also help with joint movements. When tendons become irritated or swollen, it is called tendinitis. The extensor pollicis brevis (EPB) tendon connects the EPB muscle to a bone that is near the base of the thumb. The EPB muscle helps to straighten and extend the thumb. De Quervain tenosynovitis is a condition in which the EPB tendon lining (sheath) becomes irritated, thickened, and swollen. This condition is sometimes called stenosing tenosynovitis. This condition causes pain on the thumb side of the back of the wrist. What are the causes? Causes of this condition include:  Activities that repeatedly cause your thumb and wrist to extend.  A sudden increase in activity or change in activity that affects your wrist.  What increases the risk? This condition is more likely to develop in:  Females.  People who have diabetes.  Women who have recently given birth.  People who are over 17 years of age.  People who do activities that involve repeated hand and wrist motions, such as tennis, racquetball, volleyball, gardening, and taking care of children.  People who do heavy labor.  People who have poor wrist strength and flexibility.  People who do not warm up properly before activities.  What are the signs or symptoms? Symptoms of this condition include:  Pain or tenderness over the thumb side of the back of the wrist when your thumb and wrist are not moving.  Pain that gets worse when you straighten your thumb or extend your thumb or wrist.  Pain when the injured area is touched.  Locking or catching of the thumb joint while you bend and straighten your thumb.  Decreased thumb motion due to pain.  Swelling over the affected area.  How is this diagnosed? This condition is  diagnosed with a medical history and physical exam. Your health care provider will ask for details about your injury and ask about your symptoms. How is this treated? Treatment may include the use of icing and medicines to reduce pain and swelling. You may also be advised to wear a splint or brace to limit your thumb and wrist motion. In less severe cases, treatment may also include working with a physical therapist to strengthen your wrist and calm the irritation around your EPB tendon sheath. In severe cases, surgery may be needed. Follow these instructions at home: If you have a splint or brace:  Wear it as told by your health care provider. Remove it only as told by your health care provider.  Loosen the splint or brace if your fingers become numb and tingle, or if they turn cold and blue.  Keep the splint or brace clean and dry. Managing pain, stiffness, and swelling  If directed, apply ice to the injured area. ? Put ice in a plastic bag. ? Place a towel between your skin and the bag. ? Leave the ice on for 20 minutes, 2-3 times per day.  Move your fingers often to avoid stiffness and to lessen swelling.  Raise (elevate) the injured area above the level of your heart while you are sitting or lying down. General instructions  Return to your normal activities as told by your health care provider. Ask your health care provider what activities are safe for you.  Take over-the-counter and prescription medicines only as told by your health care provider.  Keep  all follow-up visits as told by your health care provider. This is important.  Do not drive or operate heavy machinery while taking prescription pain medicine. Contact a health care provider if:  Your pain, tenderness, or swelling gets worse, even if you have had treatment.  You have numbness or tingling in your wrist, hand, or fingers on the injured side. This information is not intended to replace advice given to you by your  health care provider. Make sure you discuss any questions you have with your health care provider. Document Released: 06/20/2005 Document Revised: 11/26/2015 Document Reviewed: 08/26/2014 Elsevier Interactive Patient Education  [employer].

## 2017-02-14 NOTE — Assessment & Plan Note (Addendum)
Reports that she was unable to afford Lovastatin. Prescribed Simvastatin 20mg  daily.

## 2017-02-14 NOTE — Progress Notes (Signed)
   [facility] Phone: [phone]   Date of Visit: 02/14/2017   HPI:  Left Wrist Pain:  - reports reports of intermittent wrist pain on the left si that is somewhat chronic. However for the past few weeks it has been daily/continuous - Reports that pain is mainly around her thumb and possibly her index finger. Symptoms the pain radiates up her forearm. - Reports that sometimes the area over her first metacarpal sometimes gets puffy - Does have been intermittent numbness over the thumb and sometimes index finger - Has tried Aleve which helps somewhat. She also has been using Ace wrap; she's not sure if this helps. - She has history of carpal tunnel and had a history of surgery for this -Symptoms have been worsening over the past few weeks. No injury   ROS: See HPI.  [place]:  PMH: HTN DM2 HLD GERD with Hx of Gastric Ulcer  Benign Follicular Thyroid Nodule  Prosthetic Joint Left Hip  C Spine Arthritis Obesity Depression History of Adenomatous polyp of Colon   PHYSICAL EXAM: BP 132/80   Pulse 100   Temp 98.8 F (37.1 C) (Oral)   Wt 259 lb (117.5 kg)   SpO2 97%   BMI 47.37 kg/m  MSK: Left Wrist: No joint effusion or swelling, no erythema. Normal radial and ulnar pulses. Normal capillary refill. Positive Tinel sign over the radial aspect of the wrist. phalen test does produce numbness over the thumb and index finger. Tenderness to palpation first dorsal compartment over the radial styloid. Pain with ulnar deviation of the wrist.    ASSESSMENT/PLAN:  Left wrist pain Seems most consistent with carpal tunnel or de Quervain's tenosynovitis. Patient has a history of carpal tunnel for which she had surgery done in the past. No signs or symptoms of infection. We will try to treat with NSAIDs, ice, wrist splint. Follow-up in 4 weeks   HYPERLIPIDEMIA Reports that she was unable to afford Lovastatin. Prescribed Simvastatin 20mg  daily.   FOLLOW UP: Follow up  in 1 month for DM   Smiley Houseman, MD PGY [facility]

## 2017-03-21 ENCOUNTER — Other Ambulatory Visit: Payer: Self-pay | Admitting: Internal Medicine

## 2017-03-21 DIAGNOSIS — I10 Essential (primary) hypertension: Secondary | ICD-10-CM

## 2017-04-06 ENCOUNTER — Telehealth (INDEPENDENT_AMBULATORY_CARE_PROVIDER_SITE_OTHER): Payer: Self-pay

## 2017-04-06 NOTE — Telephone Encounter (Signed)
2 g of amoxicillin 30 minutes before procedure

## 2017-04-06 NOTE — Telephone Encounter (Signed)
Patient called and will be having some teeth pulled and would like to know if she will be needing any ABx? If so, what would you like to prescribe?  Surgeries:   1. 06/01/16- Left THA 2. 07/25/16 - I&D 3. 08/03/16 - I &D   CB [phone]

## 2017-04-07 MED ORDER — AMOXICILLIN 500 MG PO TABS: ORAL_TABLET | ORAL | 0 refills | Status: DC

## 2017-04-07 NOTE — Telephone Encounter (Signed)
Sent to the [facility]. Patient aware

## 2017-04-07 NOTE — Addendum Note (Signed)
Addended by: Precious Bard on: 04/07/2017 11:59 AM   Modules accepted: Orders

## 2017-04-10 ENCOUNTER — Telehealth (INDEPENDENT_AMBULATORY_CARE_PROVIDER_SITE_OTHER): Payer: Self-pay | Admitting: Orthopaedic Surgery

## 2017-04-10 NOTE — Telephone Encounter (Signed)
Called pt to let her know 30 mins before procedure.

## 2017-04-10 NOTE — Telephone Encounter (Signed)
Patient called and wanted to know if she take the Antibiotic 30 minutes before her tooth is pulled or does she take it 30 minutes before she arrives for the procedure.  CB#[phone].  Thank you.

## 2017-04-12 NOTE — Progress Notes (Signed)
   [facility] Phone: [phone]   Date of Visit: 04/14/2017   HPI:  Rash under Breast:  - reports of rash under bilateral breasts for about 1 week - rash it itchy but not painful  - no prior history of similar rash - symptoms have been stable  - no aggravating or alleviating factors - has tried A&D ointment, vaseline, baby powder without much improvement  - allergic to fluconazole (facial swelling)   HTN:  - HCTZ 25mg  daily, Metoprolol 25mg  BID - compliant with medication - denies chest pain, HA, blurred vision  HLD:  - Medication: Zocor 20mg  daily - compliant with medications  - no abdominal pain or myalgia  DM2:  - last A1c was 5.8 in 09/2016 from 6.5 in 04/2016 - diet controlled - denies polyuria or polydipsia  - does not check cbgs due to good control  ROS: See HPI.  [place]:  HTN GERD with hx of gastric ulcer HLD DM2 OA of Hip and C-spine s/p L Hip replacement History of Atrophic R Kidney Obesity Former Smoker  PHYSICAL EXAM: BP 130/78   Pulse 92   Temp 98.2 F (36.8 C) (Oral)   Ht 5\' 2"  (1.575 m)   Wt 256 lb 9.6 oz (116.4 kg)   SpO2 96%   BMI 46.93 kg/m  Gen: NAD Skin: small anular plaques under bilateral breasts with raised borders   ASSESSMENT/PLAN:  Health maintenance:  - Flu vaccine today   HYPERTENSION, BENIGN SYSTEMIC At goal. Continue current medication. Will check kidney function. If stable, will benefit from switching from HCTZ to ACE or ARB for renal protection in the setting of DM2.   Diabetes ([place]) A1c from 5.7 to 6.7. Still below the usual goal for DM patient. Discussed lifestyle changes for overall improvement of health. Repeat A1c in 3 months.   HYPERLIPIDEMIA Repeat lipid panel ordered as a future order. Patient to make lab visit and come in fasting. Checking LFTs.   Tinea Corporis:  - topical Terbinafine for 1-3 weeks until symptoms resolve.  - return precautions discussed.    Smiley Houseman, MD PGY [facility]

## 2017-04-14 ENCOUNTER — Ambulatory Visit (INDEPENDENT_AMBULATORY_CARE_PROVIDER_SITE_OTHER): Payer: Self-pay | Admitting: Internal Medicine

## 2017-04-14 ENCOUNTER — Encounter: Payer: Self-pay | Admitting: Internal Medicine

## 2017-04-14 VITALS — BP 130/78 | HR 92 | Temp 98.2°F | Ht 62.0 in | Wt 256.6 lb

## 2017-04-14 DIAGNOSIS — Z23 Encounter for immunization: Secondary | ICD-10-CM

## 2017-04-14 DIAGNOSIS — E785 Hyperlipidemia, unspecified: Secondary | ICD-10-CM

## 2017-04-14 DIAGNOSIS — E119 Type 2 diabetes mellitus without complications: Secondary | ICD-10-CM

## 2017-04-14 DIAGNOSIS — I10 Essential (primary) hypertension: Secondary | ICD-10-CM

## 2017-04-14 DIAGNOSIS — R21 Rash and other nonspecific skin eruption: Secondary | ICD-10-CM

## 2017-04-14 LAB — POCT GLYCOSYLATED HEMOGLOBIN (HGB A1C): Hemoglobin A1C: 6.7

## 2017-04-14 MED ORDER — TERBINAFINE HCL 1 % EX CREA: 1.0000 "application " | TOPICAL_CREAM | Freq: Two times a day (BID) | CUTANEOUS | 0 refills | Status: DC

## 2017-04-14 NOTE — Patient Instructions (Addendum)
Follow up in January for Diabetes.  Make a lab visit for your cholesterol tests when you can.   Let's try lamasil cream for the rash.    Body Ringworm Body ringworm is an infection of the skin that often causes a ring-shaped rash. Body ringworm can affect any part of your skin. It can spread easily to others. Body ringworm is also called tinea corporis. What are the causes? This condition is caused by funguses called dermatophytes. The condition develops when these funguses grow out of control on the skin. You can get this condition if you touch a person or animal that has it. You can also get it if you share clothing, bedding, towels, or any other object with an infected person or pet. What increases the risk? This condition is more likely to develop in:  Athletes who often make skin-to-skin contact with other athletes, such as wrestlers.  People who share equipment and mats.  People with a weakened immune system.  What are the signs or symptoms? Symptoms of this condition include:  Itchy, raised red spots and bumps.  Red scaly patches.  A ring-shaped rash. The rash may have: ? A clear center. ? Scales or red bumps at its center. ? Redness near its borders. ? Dry and scaly skin on or around it.  How is this diagnosed? This condition can usually be diagnosed with a skin exam. A skin scraping may be taken from the affected area and examined under a microscope to see if the fungus is present. How is this treated? This condition may be treated with:  An antifungal cream or ointment.  An antifungal shampoo.  Antifungal medicines. These may be prescribed if your ringworm is severe, keeps coming back, or lasts a long time.  Follow these instructions at home:  Take over-the-counter and prescription medicines only as told by your health care provider.  If you were given an antifungal cream or ointment: ? Use it as told by your health care provider. ? Wash the infected area  and dry it completely before applying the cream or ointment.  If you were given an antifungal shampoo: ? Use it as told by your health care provider. ? Leave the shampoo on your body for 3-5 minutes before rinsing.  While you have a rash: ? Wear loose clothing to stop clothes from rubbing and irritating it. ? Wash or change your bed sheets every night.  If your pet has the same infection, take your pet to see a Animal nutritionist. How is this prevented?  Practice good hygiene.  Wear sandals or shoes in public places and showers.  Do not share personal items with others.  Avoid touching red patches of skin on other people.  Avoid touching pets that have bald spots.  If you touch an animal that has a bald spot, wash your hands. Contact a health care provider if:  Your rash continues to spread after 7 days of treatment.  Your rash is not gone in 4 weeks.  The area around your rash gets red, warm, tender, and swollen. This information is not intended to replace advice given to you by your health care provider. Make sure you discuss any questions you have with your health care provider. Document Released: 06/17/2000 Document Revised: 11/26/2015 Document Reviewed: 04/16/2015 Elsevier Interactive Patient Education  [employer].

## 2017-04-16 LAB — COMPREHENSIVE METABOLIC PANEL
A/G RATIO: 1.4 (ref 1.2–2.2)
ALBUMIN: 4.6 g/dL (ref 3.5–5.5)
ALT: 17 IU/L (ref 0–32)
AST: 18 IU/L (ref 0–40)
Alkaline Phosphatase: 139 IU/L — ABNORMAL HIGH (ref 39–117)
BUN / CREAT RATIO: 12 (ref 9–23)
BUN: 13 mg/dL (ref 6–24)
CALCIUM: 10.1 mg/dL (ref 8.7–10.2)
CHLORIDE: 99 mmol/L (ref 96–106)
CO2: 27 mmol/L (ref 20–29)
Creatinine, Ser: 1.13 mg/dL — ABNORMAL HIGH (ref 0.57–1.00)
GFR, EST AFRICAN AMERICAN: 61 mL/min/{1.73_m2} (ref >59)
GFR, EST NON AFRICAN AMERICAN: 53 mL/min/{1.73_m2} — AB (ref >59)
GLOBULIN, TOTAL: 3.3 g/dL (ref 1.5–4.5)
Glucose: 96 mg/dL (ref 65–99)
POTASSIUM: 3.6 mmol/L (ref 3.5–5.2)
Sodium: 142 mmol/L (ref 134–144)
TOTAL PROTEIN: 7.9 g/dL (ref 6.0–8.5)

## 2017-04-16 NOTE — Assessment & Plan Note (Signed)
At goal. Continue current medication. Will check kidney function. If stable, will benefit from switching from HCTZ to ACE or ARB for renal protection in the setting of DM2.

## 2017-04-16 NOTE — Assessment & Plan Note (Signed)
" >>  ASSESSMENT AND PLAN FOR HYPERTENSION, BENIGN SYSTEMIC WRITTEN ON 04/16/2017  4:50 PM BY Nikeisha Klutz G, MD  At goal. Continue current medication. Will check kidney function. If stable, will benefit from switching from HCTZ to ACE or ARB for renal protection in the setting of DM2.  "

## 2017-04-16 NOTE — Assessment & Plan Note (Signed)
Repeat lipid panel ordered as a future order. Patient to make lab visit and come in fasting. Checking LFTs.

## 2017-04-16 NOTE — Assessment & Plan Note (Signed)
A1c from 5.7 to 6.7. Still below the usual goal for DM patient. Discussed lifestyle changes for overall improvement of health. Repeat A1c in 3 months.

## 2017-04-17 ENCOUNTER — Other Ambulatory Visit: Payer: Self-pay

## 2017-04-17 DIAGNOSIS — E785 Hyperlipidemia, unspecified: Secondary | ICD-10-CM

## 2017-04-18 ENCOUNTER — Encounter: Payer: Self-pay | Admitting: Internal Medicine

## 2017-04-18 LAB — LIPID PANEL
CHOL/HDL RATIO: 2.7 ratio (ref 0.0–4.4)
Cholesterol, Total: 132 mg/dL (ref 100–199)
HDL: 49 mg/dL (ref >39)
LDL CALC: 61 mg/dL (ref 0–99)
Triglycerides: 109 mg/dL (ref 0–149)
VLDL CHOLESTEROL CAL: 22 mg/dL (ref 5–40)

## 2017-04-25 ENCOUNTER — Other Ambulatory Visit: Payer: Self-pay | Admitting: Internal Medicine

## 2017-04-26 NOTE — Telephone Encounter (Signed)
Note made in error

## 2017-05-11 NOTE — Progress Notes (Signed)
   [facility] Phone: [phone]   Date of Visit: 05/12/2017   HPI:  Follow up Rash:  -Patient was seen on 10/12 for rash under breast.  She was prescribed topical terbinafine and she has used it for about 2 weeks.  She reports that symptoms are getting better but not completely resolved.  She reports that she has some itching -At her right groin region as well she thinks that there is a similar rash there. -No fevers, chills, nausea, vomiting.  No oral lesions.  No one else has rash at home.  ROS: See HPI.  [place]:  [place]:  HTN GERD with hx of gastric ulcer HLD DM2 OA of Hip and C-spine s/p L Hip replacement History of Atrophic R Kidney Obesity Former Smoker  PHYSICAL EXAM: BP 126/76   Pulse (!) 104   Temp 98.2 F (36.8 C) (Oral)   Ht 5\' 2"  (1.575 m)   Wt 260 lb (117.9 kg)   SpO2 95%   BMI 47.55 kg/m  GEN: NAD, obese SKIN: The rash under bilateral breasts has improved.  There is no clear border delineating this rash as noted in the first visit.  There is no erythema.  The area under the breast seems a little darker on skin color compared to the rest of her skin otherwise unremarkable.  Skin over the right groin and abdomen appears normal.; warm and well-perfused.  Evaluation of the skin under Woods lamp was unremarkable. PSYCH: Mood and affect euthymic, normal rate and volume of speech NEURO: Awake, alert, no focal deficits grossly, normal speech   ASSESSMENT/PLAN:  Tinea Corporis:  I still think this is consistent with tinea corporis.  Especially since it is improving with Lamisil cream.  [place] lamp evaluation of the skin was unremarkable so  very unlikely this is erythrasma.  No signs of cellulitis or abscess.  We decided on continuing Lamisil for 2 more weeks to see if it can fully resolve symptoms.  Of note patient has a history of type 2 diabetes.   after discussion of starting ARB for renal protection, we decided to go ahead and  discontinue her hydrochlorothiazide and start losartan 50 mg daily for hypertension and renal protection in the setting of diabetes.  She had a BMP late last month with stable creatinine and normal potassium.  She is to make a lab visit to get repeat BMP 1 week after starting losartan to recheck creatinine and potassium.  Follow-up in 3 weeks  Smiley Houseman, MD PGY [facility]

## 2017-05-12 ENCOUNTER — Encounter: Payer: Self-pay | Admitting: Internal Medicine

## 2017-05-12 ENCOUNTER — Ambulatory Visit (INDEPENDENT_AMBULATORY_CARE_PROVIDER_SITE_OTHER): Payer: Self-pay | Admitting: Internal Medicine

## 2017-05-12 ENCOUNTER — Other Ambulatory Visit: Payer: Self-pay

## 2017-05-12 VITALS — BP 126/76 | HR 104 | Temp 98.2°F | Ht 62.0 in | Wt 260.0 lb

## 2017-05-12 DIAGNOSIS — B354 Tinea corporis: Secondary | ICD-10-CM

## 2017-05-12 DIAGNOSIS — I10 Essential (primary) hypertension: Secondary | ICD-10-CM

## 2017-05-12 MED ORDER — LOSARTAN POTASSIUM 50 MG PO TABS: 50.0000 mg | ORAL_TABLET | Freq: Every day | ORAL | 0 refills | Status: DC

## 2017-05-12 NOTE — Patient Instructions (Signed)
Stop taking hydrochlorothiazide  Start taking Losartan   Follow up in 1 week for a lab visit for blood work   Follow up in 3 weeks if rash does not go away.

## 2017-05-13 ENCOUNTER — Encounter: Payer: Self-pay | Admitting: Internal Medicine

## 2017-05-17 ENCOUNTER — Ambulatory Visit: Payer: Self-pay

## 2017-07-12 ENCOUNTER — Other Ambulatory Visit: Payer: Self-pay | Admitting: Internal Medicine

## 2017-07-12 MED ORDER — SIMVASTATIN 20 MG PO TABS: 20.0000 mg | ORAL_TABLET | Freq: Every day | ORAL | 3 refills | Status: DC

## 2017-07-12 NOTE — Progress Notes (Signed)
Refilled statin

## 2017-07-13 ENCOUNTER — Telehealth: Payer: Self-pay | Admitting: *Deleted

## 2017-07-13 NOTE — Telephone Encounter (Signed)
Patient called in to inform PCP that she did not start losartan as ordered in Nov 2018 over concerns with having only one kidney. Hubbard Hartshorn, RN, BSN

## 2017-07-13 NOTE — Telephone Encounter (Signed)
Called patient back refer to phone note

## 2017-07-13 NOTE — Telephone Encounter (Signed)
Called patient. She reports she is worried about the medication. We discussed the risks and benefits. Of the medication especially in the setting of DM2. Patient will think about it and let me know.

## 2017-07-21 ENCOUNTER — Ambulatory Visit (INDEPENDENT_AMBULATORY_CARE_PROVIDER_SITE_OTHER): Payer: Self-pay | Admitting: Family Medicine

## 2017-07-21 ENCOUNTER — Encounter: Payer: Self-pay | Admitting: Family Medicine

## 2017-07-21 ENCOUNTER — Other Ambulatory Visit: Payer: Self-pay

## 2017-07-21 VITALS — BP 128/84 | HR 61 | Temp 98.6°F | Ht 62.0 in | Wt 258.0 lb

## 2017-07-21 DIAGNOSIS — L299 Pruritus, unspecified: Secondary | ICD-10-CM

## 2017-07-21 DIAGNOSIS — R519 Headache, unspecified: Secondary | ICD-10-CM

## 2017-07-21 DIAGNOSIS — R51 Headache: Secondary | ICD-10-CM

## 2017-07-21 DIAGNOSIS — I10 Essential (primary) hypertension: Secondary | ICD-10-CM

## 2017-07-21 DIAGNOSIS — T50905A Adverse effect of unspecified drugs, medicaments and biological substances, initial encounter: Secondary | ICD-10-CM

## 2017-07-21 DIAGNOSIS — Z1239 Encounter for other screening for malignant neoplasm of breast: Secondary | ICD-10-CM

## 2017-07-21 DIAGNOSIS — Z1231 Encounter for screening mammogram for malignant neoplasm of breast: Secondary | ICD-10-CM

## 2017-07-21 MED ORDER — HYDROXYZINE HCL 10 MG PO TABS
10.0000 mg | ORAL_TABLET | Freq: Three times a day (TID) | ORAL | 0 refills | Status: AC | PRN
Start: 2017-07-21 — End: 2017-07-31

## 2017-07-21 MED ORDER — PREDNISONE 20 MG PO TABS: 20.0000 mg | ORAL_TABLET | Freq: Every day | ORAL | 0 refills | Status: AC

## 2017-07-21 NOTE — Patient Instructions (Signed)
Drug Allergy A drug allergy is when your body reacts in a bad way to a medicine. This can be life-threatening. If you have an allergic reaction, get help right away, even if the reaction seems gentle (mild). Your doctor may teach you how to use an allergy kit (anaphylaxis kit) and how to give yourself an allergy shot (epinephrine injection). You can give yourself an allergy shot with what is commonly called an auto-injector "pen." Symptoms of a Gentle Reaction  A stuffy nose (nasal congestion).  Tingling in your mouth.  An itchy, red rash. Symptoms of a Very Bad Reaction  Swelling of your eyes, lips, face, or tongue.  Swelling of the back of your mouth and your throat.  Breathing loudly (wheezing).  A hoarse voice.  Itchy, red, swollen areas of skin (hives).  Dizziness or light-headedness.  Passing out (fainting).  Feeling worried or nervous (anxiety).  Feeling confused.  Pain in your belly (abdomen).  Trouble with breathing, talking, or swallowing.  A tight feeling in your chest.  Fast or uneven heartbeats (palpitations).  Throwing up (vomiting).  Watery poop (diarrhea). Follow these instructions at home: If You Have a Very Bad Allergy:  Always keep an auto-injector pen or your allergy kit with you. These could save your life. Use them as told by your doctor.  Make sure that you, the people who live with you, and your employer know: ? How to use your allergy kit. ? How to use an auto-injector pen to give you an allergy shot.  If you used your auto-injector pen: ? Get more medicine for it right away. This is important in case you have another reaction. ? Get help right away.  Wear a bracelet or necklace that says you have an allergy, if your doctor tells you to do this. General instructions  Avoid medicines that you are allergic to.  Take over-the-counter and prescription medicines only as told by your doctor.  Do not drive until your doctor says it is  safe.  If you have itchy, red, swollen areas of skin or a rash: ? Use over-the-counter medicine (antihistamine) as told by your doctor. ? Put cold, wet cloths (cold compresses) on your skin. ? Take baths or showers in cool water. Avoid hot water.  If you had tests done, it is your responsibility to get your test results. Ask your doctor when your results will be ready.  Tell any doctors who care for you that you have a drug allergy.  Keep all follow-up visits as told by your doctor. This is important. Contact a doctor if:  You start to have any of these: ? A stuffy nose. ? Tingling in your mouth. ? An itchy, red rash.  You have symptoms that last more than 2 days after your reaction.  Your symptoms get worse.  You get new symptoms. Get help right away if:  You had to use your auto-injector pen. You must go to the emergency room even if the medicine seems to be working.  You have any of these: ? Swelling in your eyes, lips, face, or tongue. ? Swelling in the back of your mouth or your throat. ? Loud breathing. ? A hoarse voice. ? Itchy, red, swollen areas of skin. ? Trouble with breathing, talking, or swallowing. ? A tight feeling in your chest. ? A fast heartbeat.  You have throwing up that gets very bad.  You have watery poop that gets very bad.  You feel dizzy or light-headed.  You   pass out. These symptoms may be an emergency. Do not wait to see if the symptoms will go away. Use your auto-injector pen or allergy kit as you have been told. Get medical help right away. Call your local emergency services (911 in the [place] Do not drive yourself to the hospital. This information is not intended to replace advice given to you by your health care provider. Make sure you discuss any questions you have with your health care provider. Document Released: 07/28/2004 Document Revised: 11/26/2015 Document Reviewed: 01/20/2015 Elsevier Interactive Patient Education  2018 Elsevier  Inc.  

## 2017-07-21 NOTE — Progress Notes (Signed)
Subjective:     Patient ID: Kristin [name], female   DOB: 05/12/1956, 62 y.o.   MRN: [id number]  HPI Itching: Started itching last week. She started some new medication, Venegar pill for detox. Since then she started itching. However, after stopping meds she continues to itch. Itching is wide spread. This is intermittent, it can be so bad whenever she itches. She also has red spots on her legs which is now clearing off. Currently her itching improved a bit but it is starting to worsen. She used cortisone cream, aveeno body wash and lotion with no improvement. 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 She is yet to take her meds this morning hence the reason for her mild headache. HTN: Not taking Losartan. She has one Kidney, she is concern it will affect her kidney. She is otherwise compliant with Metoprolol 25 BID.  Current Outpatient Medications on File Prior to Visit  Medication Sig Dispense Refill  . amitriptyline (ELAVIL) 75 MG tablet Take 75 mg by mouth 2 (two) times daily.    Marland Kitchen aspirin EC 81 MG tablet Take 81 mg by mouth daily.    . metoprolol tartrate (LOPRESSOR) 25 MG tablet TAKE 1 TABLET BY MOUTH TWICE DAILY 180 tablet 0  . omeprazole (PRILOSEC) 20 MG capsule Take 1 capsule (20 mg total) by mouth daily. (Patient taking differently: Take 20 mg by mouth every evening. ) 30 capsule 0  . simvastatin (ZOCOR) 20 MG tablet Take 1 tablet (20 mg total) by mouth at bedtime. 90 tablet 3  . acetaminophen (TYLENOL) 500 MG tablet Take 500-1,000 mg by mouth every 6 (six) hours as needed for headache (or pain).    Marland Kitchen amoxicillin (AMOXIL) 500 MG tablet Take 4 Tabs (2 g) of amoxicillin 30 minutes before procedure (Patient not taking: Reported on 07/21/2017) 4 tablet 0  . cyclobenzaprine (FLEXERIL) 10 MG tablet Take 1 tablet (10 mg total) by mouth 3 (three) times daily as needed for muscle spasms. (Patient not taking: Reported on 07/21/2017) 90 tablet 2  . ferrous sulfate  325 (65 FE) MG tablet Take 1 tablet (325 mg total) by mouth 2 (two) times daily. (Patient not taking: Reported on 07/21/2017) 60 tablet 3  . losartan (COZAAR) 50 MG tablet Take 1 tablet (50 mg total) daily by mouth. (Patient not taking: Reported on 07/21/2017) 30 tablet 0  . terbinafine (LAMISIL) 1 % cream Apply 1 application topically 2 (two) times daily. For 1-3 weeks until symptoms resolve. 30 g 0   No current facility-administered medications on file prior to visit.    Past Medical History:  Diagnosis Date  . Allergy   . Anemia   . Anxiety   . Arthritis   . Back pain, chronic   . Bronchitis   . Cough   . Depression   . GERD (gastroesophageal reflux disease)   . Hyperlipidemia   . Hypertension   . Obesity   . 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 Seizures ([place])    last >10 yrs ago  . Somatization disorder   . Tobacco abuse    Vitals:   07/21/17 1106  BP: 128/84  Pulse: 61  Temp: 98.6 F (37 C)  TempSrc: Oral  Weight: 258 lb (117 kg)  Height: 5\' 2"  (1.575 m)     Review of Systems  Respiratory: Negative.   Cardiovascular: Negative.   Gastrointestinal: Negative.   Skin: Positive for rash.  itching  Neurological: Positive for headaches.  All other systems reviewed and are negative.      Objective:   Physical Exam  Constitutional: She is oriented to person, place, and time. She appears well-developed. No distress.  Cardiovascular: Normal rate, regular rhythm and normal heart sounds.  No murmur heard. Pulmonary/Chest: Effort normal and breath sounds normal. No respiratory distress. She has no wheezes.  Musculoskeletal: Normal range of motion. She exhibits no edema.  Neurological: She is alert and oriented to person, place, and time. No cranial nerve deficit or sensory deficit. She displays a negative Romberg sign.  Skin:     Nursing note and vitals reviewed.      Assessment:     Generalized body itch Headache HTN    Plan:     1.  Skin itching with rash likely due to drug reaction.     Avoid trigger medication.    Start oral steroid with hydroxyzine prn itching.    Continue topical steroid with aveeno cream.    Red flag signs discussed.    F/U soon or go to the ED if symptoms persists or worsens.    She verbalized understanding.  2. No acute change in her headache and no neurologic deficit.     Continue home regimen prn.     See PCP soon.  3. BP looks good despite not taking her Losartan.     Continue Metoprolol for now.     F/U with PCP in 1-2 weeks for BP management.  Health maintenance. Mammogram screening recommended and was ordered. She agreed with screening and will call for appointment.

## 2017-07-31 ENCOUNTER — Ambulatory Visit (INDEPENDENT_AMBULATORY_CARE_PROVIDER_SITE_OTHER): Payer: Self-pay

## 2017-07-31 ENCOUNTER — Telehealth: Payer: Self-pay

## 2017-07-31 ENCOUNTER — Encounter (INDEPENDENT_AMBULATORY_CARE_PROVIDER_SITE_OTHER): Payer: Self-pay | Admitting: Orthopaedic Surgery

## 2017-07-31 ENCOUNTER — Ambulatory Visit (INDEPENDENT_AMBULATORY_CARE_PROVIDER_SITE_OTHER): Payer: Self-pay | Admitting: Orthopaedic Surgery

## 2017-07-31 DIAGNOSIS — M25552 Pain in left hip: Secondary | ICD-10-CM

## 2017-07-31 NOTE — Telephone Encounter (Signed)
She should come back to be seen.

## 2017-07-31 NOTE — Telephone Encounter (Signed)
Patient left message on nurse line that the medication she was prescribed last week for itching is not working and wants to know what else she can do. Danley Danker, RN [facility] RN)

## 2017-07-31 NOTE — Progress Notes (Signed)
Post-Op Visit Note   Patient: Kristin [name]           Date of Birth: Nov 09, 1955           MRN: [id number] Visit Date: 07/31/2017 PCP: Smiley Houseman, MD   Assessment & Plan:  Chief Complaint:  Chief Complaint  Patient presents with  . Left Hip - Pain, Follow-up   Visit Diagnoses:  1. Pain in left hip     Plan: At this point, we are pleased with Kristin [name]'s progress.  She will continue working on her home exercise program.  Due to the changes seen on imaging today, we will keep a close eye on Kristin [name] for if symptoms arise.  She will call and let us know if she regresses or does not continue to make progress.  Otherwise, she will follow-up with [place] in 1 year for repeat evaluation and x-ray.  Kristin [name] is a 62 year old female who comes in for follow-up.  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  Date of surgery 08/03/2016.  Overall doing well.  Minimal pain.  She is regained near full motion and strength although she is utilizing a cane with ambulation.  Follow-Up Instructions: Return in about 1 year (around 07/31/2018).   Orders:  Orders Placed This Encounter  Procedures  . XR Pelvis 1-2 Views   No orders of the defined types were placed in this encounter.   Imaging: Xr Pelvis 1-2 Views  Result Date: 07/31/2017 Imaging of the left hip reveals  increased lucency at the superior aspect of the acetabulum as well as the lateral aspect of the proximal stem.   PMFS History: Patient Active Problem List   Diagnosis Date Noted  . Wound of left leg, sequela 10/03/2016  . 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 Medication monitoring encounter 08/24/2016  . Status post left hip replacement 06/01/2016  . Sinus tachycardia 05/19/2016  . History of adenomatous polyp of colon 03/21/2016  . Diabetes ([place]) 01/21/2016  . Thyroid nodule 12/25/2014  . Infiltrate noted on imaging study   . Cervical spine arthritis 11/14/2014  . Primary  osteoarthritis of left hip 11/14/2014  . Loss of consciousness ([place]) 11/14/2014  . Musculoskeletal chest and rib pain 03/26/2014  . Rib pain on right side 02/28/2014  . Groin pain 04/18/2013  . UNSPECIFIED RENAL SCLEROSIS 01/16/2009  . GERD, SEVERE 05/25/2007  . OBESITY NOS 04/03/2007  . HYPERLIPIDEMIA 08/31/2006  . Major depression, recurrent ([place]) 08/31/2006  . 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 Former smoker 08/31/2006  . HYPERTENSION, BENIGN SYSTEMIC 08/31/2006  . GASTRIC ULCER ACUTE WITHOUT HEMORRHAGE 08/31/2006  . CONVULSIONS, SEIZURES, NOS 08/31/2006   Past Medical History:  Diagnosis Date  . Allergy   . Anemia   . Anxiety   . Arthritis   . Back pain, chronic   . Bronchitis   . Cough   . Depression   . GERD (gastroesophageal reflux disease)   . Hyperlipidemia   . Hypertension   . Obesity   . 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 Seizures ([place])    last >10 yrs ago  . Somatization disorder   . Tobacco abuse     Family History  Problem Relation Age of Onset  . Other Mother        failed surgery  . Heart Problems Father        poss MI, not sure:per pt  . Alzheimer's disease Maternal  Grandmother   . Heart disease Maternal Grandfather   . Colon cancer Maternal Uncle        not sure age of onset    Past Surgical History:  Procedure Laterality Date  . ABDOMINAL HYSTERECTOMY    . ANTERIOR HIP REVISION Left 07/25/2016   Procedure: LEFT HIP IRRIGATION AND DEBRIDEMENT, REVISION OF HEAD AND LINER;  Surgeon: Leandrew Koyanagi, MD;  Location: [place];  Service: Orthopedics;  Laterality: Left;  . CHOLECYSTECTOMY    . INCISION AND DRAINAGE HIP Left 08/03/2016   Procedure: IRRIGATION AND DEBRIDEMENT LEFT HIP, VAC PLACEMENT;  Surgeon: Leandrew Koyanagi, MD;  Location: [place];  Service: Orthopedics;  Laterality: Left;  . kidney stent Right 2002  . TONSILLECTOMY Bilateral 12/06/2014   Procedure: TONSILLECTOMY, BILATERAL;  Surgeon: Ruby Cola, MD;  Location: [place] [facility];   Service: ENT;  Laterality: Bilateral;  . TONSILLECTOMY    . TOTAL HIP ARTHROPLASTY Left 06/01/2016   Procedure: LEFT TOTAL HIP ARTHROPLASTY ANTERIOR APPROACH;  Surgeon: Leandrew Koyanagi, MD;  Location: [place];  Service: Orthopedics;  Laterality: Left;  . TUBAL LIGATION     Social History   Occupational History  . Not on file  Tobacco Use  . Smoking status: Former Smoker    Packs/day: 0.25    Years: 0.50    Pack years: 0.12    Types: Cigarettes    Last attempt to quit: 01/27/2014    Years since quitting: 3.5  . Smokeless tobacco: Never Used  Substance and Sexual Activity  . Alcohol use: No  . Drug use: No  . Sexual activity: Not on file

## 2017-07-31 NOTE — Telephone Encounter (Signed)
Pt contacted and follow up apt has been scheduled for 2/1.

## 2017-08-01 ENCOUNTER — Other Ambulatory Visit: Payer: Self-pay | Admitting: *Deleted

## 2017-08-01 MED ORDER — METOPROLOL TARTRATE 25 MG PO TABS: 25.0000 mg | ORAL_TABLET | Freq: Two times a day (BID) | ORAL | 0 refills | Status: DC

## 2017-08-04 ENCOUNTER — Ambulatory Visit (INDEPENDENT_AMBULATORY_CARE_PROVIDER_SITE_OTHER): Payer: Self-pay | Admitting: Internal Medicine

## 2017-08-04 ENCOUNTER — Encounter: Payer: Self-pay | Admitting: Internal Medicine

## 2017-08-04 ENCOUNTER — Other Ambulatory Visit: Payer: Self-pay

## 2017-08-04 VITALS — BP 136/80 | HR 72 | Temp 98.3°F | Ht 62.0 in | Wt 265.0 lb

## 2017-08-04 DIAGNOSIS — E785 Hyperlipidemia, unspecified: Secondary | ICD-10-CM

## 2017-08-04 DIAGNOSIS — I10 Essential (primary) hypertension: Secondary | ICD-10-CM

## 2017-08-04 DIAGNOSIS — L299 Pruritus, unspecified: Secondary | ICD-10-CM

## 2017-08-04 MED ORDER — TRIAMCINOLONE 0.1 % CREAM:EUCERIN CREAM 1:1: 1.0000 "application " | TOPICAL_CREAM | Freq: Two times a day (BID) | CUTANEOUS | 0 refills | Status: DC

## 2017-08-04 NOTE — Assessment & Plan Note (Signed)
" >>  ASSESSMENT AND PLAN FOR HYPERTENSION, BENIGN SYSTEMIC WRITTEN ON 08/04/2017  7:08 PM BY Cieara Stierwalt G, MD  Patient to stop HCTZ and start Losartan  50mg . BMP today and repeat in 1 week at lab visit.  "

## 2017-08-04 NOTE — Patient Instructions (Signed)
Try the Eucerin: Triamcinolone ointment twice a day for about 1 week.   Follow up in 1 week if symptoms do not improve  Make a lab visit in 1 week to check your electrolytes and kidney function  Stop taking HCTZ. Start take Losartan.

## 2017-08-04 NOTE — Assessment & Plan Note (Signed)
Patient to stop HCTZ and start Losartan 50mg . BMP today and repeat in 1 week at lab visit.

## 2017-08-04 NOTE — Progress Notes (Signed)
   [facility] Phone: [phone]   Date of Visit: 08/04/2017   HPI:  Pruritus:  - started suddenly around Jan 19th - was seen in clinic and initially thought it was drug reaction to the vitamin or Vinegar she was taking. She stopped taking this. She was prescribed PO Prednisone course and Atarax  - patient reports the medication did not improve symptoms - she reports it started suddenly all over her trunk, back, elbows and lowe extremities - there is no associated rash - it is intermittent but there is no aggravating factor. Is is not worse at night - no new detergents, soaps, lotions. She uses dove unscented soap - she has also tried Hydrocortisone, Vaseline, and Aveeno with hydrocortisone without much improvement  - no one else at home has similar symptoms  - no chronic liver or kidney disease  HTN:  - of note patient is currently taking HCTZ instead of Losartan. At the last visit we decided to switch to ARB due to renal protection in the setting of DM2. She has not made this switch yet.   ROS: See HPI.  [place]:  DM2  HTN HLD  PHYSICAL EXAM: BP 136/80   Pulse 72   Temp 98.3 F (36.8 C) (Oral)   Ht 5\' 2"  (1.575 m)   Wt 265 lb (120.2 kg)   SpO2 98%   BMI 48.47 kg/m  GEN: NAD CV: RRR, no murmurs, rubs, or gallops PULM: CTAB, normal effort SKIN: No rash or cyanosis; there are excoriations on her back; warm and well-perfused EXTR: No lower extremity edema or calf tenderness PSYCH: Mood and affect euthymic, normal rate and volume of speech NEURO: Awake, alert, no focal deficits grossly, normal speech   ASSESSMENT/PLAN:  Health maintenance:  - dicussed getting mammogram. Patient currently cannot afford.   Pruritus:  Unclear in etiology. Unlikely medication/drug reaction. No signs or history of eczema. No history of systemic disease that may cause pruritus. However will check CMP and TSH. Try Eucerin-Triamcinolone BID x 1-2 weeks. Follow up if  symptoms do not improve. May need to refer to allergy specialist.   HYPERTENSION, BENIGN SYSTEMIC Patient to stop HCTZ and start Losartan 50mg . BMP today and repeat in 1 week at lab visit.    Smiley Houseman, MD PGY [place]

## 2017-08-05 LAB — TSH: TSH: 0.652 u[IU]/mL (ref 0.450–4.500)

## 2017-08-05 LAB — CMP14+EGFR
ALK PHOS: 145 IU/L — AB (ref 39–117)
ALT: 27 IU/L (ref 0–32)
AST: 22 IU/L (ref 0–40)
Albumin/Globulin Ratio: 1.6 (ref 1.2–2.2)
Albumin: 4.5 g/dL (ref 3.6–4.8)
BUN/Creatinine Ratio: 16 (ref 12–28)
BUN: 17 mg/dL (ref 8–27)
Bilirubin Total: 0.4 mg/dL (ref 0.0–1.2)
CHLORIDE: 95 mmol/L — AB (ref 96–106)
CO2: 28 mmol/L (ref 20–29)
Calcium: 10.5 mg/dL — ABNORMAL HIGH (ref 8.7–10.3)
Creatinine, Ser: 1.04 mg/dL — ABNORMAL HIGH (ref 0.57–1.00)
GFR calc non Af Amer: 59 mL/min/{1.73_m2} — ABNORMAL LOW (ref >59)
GFR, EST AFRICAN AMERICAN: 67 mL/min/{1.73_m2} (ref >59)
GLUCOSE: 121 mg/dL — AB (ref 65–99)
Globulin, Total: 2.9 g/dL (ref 1.5–4.5)
Potassium: 3.8 mmol/L (ref 3.5–5.2)
Sodium: 141 mmol/L (ref 134–144)
TOTAL PROTEIN: 7.4 g/dL (ref 6.0–8.5)

## 2017-08-07 ENCOUNTER — Telehealth: Payer: Self-pay

## 2017-08-07 NOTE — Telephone Encounter (Signed)
Called pharmacy. Ratio of cream 1:1 and a jar 454 g

## 2017-08-07 NOTE — Telephone Encounter (Signed)
Pharmacist left message on nurse line regarding questions about the Triamcinolone compound. Requested a call back. Called back to pharmacy but had to disconnect after holding for 10 minutes. Please try again to find out questions.  Danley Danker, RN [facility] RN)

## 2017-08-10 ENCOUNTER — Encounter: Payer: Self-pay | Admitting: Internal Medicine

## 2017-08-11 ENCOUNTER — Other Ambulatory Visit: Payer: Self-pay

## 2017-08-11 DIAGNOSIS — I10 Essential (primary) hypertension: Secondary | ICD-10-CM

## 2017-08-12 LAB — BASIC METABOLIC PANEL
BUN/Creatinine Ratio: 15 (ref 12–28)
BUN: 14 mg/dL (ref 8–27)
CALCIUM: 9.8 mg/dL (ref 8.7–10.3)
CO2: 22 mmol/L (ref 20–29)
Chloride: 104 mmol/L (ref 96–106)
Creatinine, Ser: 0.93 mg/dL (ref 0.57–1.00)
GFR calc Af Amer: 77 mL/min/{1.73_m2} (ref >59)
GFR, EST NON AFRICAN AMERICAN: 67 mL/min/{1.73_m2} (ref >59)
Glucose: 149 mg/dL — ABNORMAL HIGH (ref 65–99)
POTASSIUM: 4.6 mmol/L (ref 3.5–5.2)
Sodium: 143 mmol/L (ref 134–144)

## 2017-08-23 ENCOUNTER — Telehealth: Payer: Self-pay

## 2017-08-23 NOTE — Telephone Encounter (Signed)
Called patient to report BMP and TSH results

## 2017-08-23 NOTE — Telephone Encounter (Signed)
Patient left message on nurse line requesting lab results from 08/04/17 and 08/11/17. Danley Danker, RN [facility] RN)

## 2017-09-13 ENCOUNTER — Telehealth: Payer: Self-pay | Admitting: *Deleted

## 2017-09-13 NOTE — Telephone Encounter (Signed)
Message left on clinic nurse voice mail - Patient wants to speak with Dr. [place] Schimke about recall on BP med (losartan).  Patient stopped medication after she saw info on recall.  Message routed to Dr. [place] Schimke.  FYI - states headaches are worse on left side of head.  Tylenol and Aleve are not helping headache.  Will check with Dr. [place] Schimke for advice.   Burna Forts, BSN, RN-BC

## 2017-09-14 MED ORDER — CANDESARTAN CILEXETIL 8 MG PO TABS: 8.0000 mg | ORAL_TABLET | Freq: Every day | ORAL | 3 refills | Status: DC

## 2017-09-14 NOTE — Telephone Encounter (Signed)
Called patient. Due to recall, patient agreeable to switch to another medication. Candesartan 8mg  (equivalent to her Losartan dose).  She is agreeable to come in to discuss her Headaches.

## 2017-09-19 ENCOUNTER — Telehealth: Payer: Self-pay

## 2017-09-19 NOTE — Telephone Encounter (Signed)
Pt called nurse line, states candesartan is too expensive. Was changed from losartan due to recall. It does look like Irbesartan is on the [employer] $4. Pt has orange card Her call back [phone] Wallace Cullens, RN

## 2017-09-20 NOTE — Progress Notes (Signed)
   [facility] Phone: [phone]   Date of Visit: 09/21/2017   HPI:  Headache: -Reports of intermittent headaches the past 5 years.  She was told that her headache may be due to arthritis of her left shoulder.  Symptoms have been getting worse lately. -She describes the headache as a pulling sensation on the left side of her head and neck.  She describes it as an ache -Symptoms do improve with Tylenol but takes about 1.5 hours to resolve. -Denies any blurred vision, nausea, or vomiting  ROS: See HPI.  [place]:  PMH: Obesity HTN DM2 HLD GERD with hx of gastric ulcer Thyroid Nodule OA Depression Former Smoker   PHYSICAL EXAM: BP 136/80   Pulse 92   Temp 98.2 F (36.8 C) (Oral)   Ht 5\' 2"  (1.575 m)   Wt 260 lb 3.2 oz (118 kg)   SpO2 100%   BMI 47.59 kg/m  GEN: NAD nontoxic appearing,  HEENT: Atraumatic, normocephalic, neck supple, EOMI, sclera clear, PERRL CV: RRR, no murmurs, rubs, or gallops PULM: CTAB, normal effort MSK: Tenderness to palpation on the left upper trapezius border.  Discomfort of the left lateral neck with rotation of the head to either side.  Tenderness to palpation of the [facility] joint on the left shoulder.  Normal range of motion of the left shoulder although with some discomfort .  Normal strength of the left upper extremity and normal sensation to light touch.  Tenderness to palpation of her left lateral posterior scalp. SKIN: No rash or cyanosis; warm and well-perfused EXTR: No lower extremity edema or calf tenderness PSYCH: Mood and affect euthymic, normal rate and volume of speech NEURO: Awake, alert, no focal deficits grossly, normal speech;  ASSESSMENT/PLAN:  Headache: Her headaches seem more consistent with a muscular etiology as I can reproduce the pain with palpation of her scalp as well as the left upper trapezius border.  I think her symptoms are from cervical strain.  Think she would benefit from NSAIDs.  Will start  Mobic 7.5 mg daily for the next 5 days then daily as needed.  Take with food.  Additionally provided neck range of motion exercises.   prescription for Flexeril 5 mg at bedtime if needed.  Follow-up in 3-4 weeks if symptoms are not improving.  HYPERTENSION, BENIGN SYSTEMIC A few months ago we switched her from HCTZ to losartan for kidney protection in the setting of type 2 diabetes.  However due to issues with recall and due to the inability to afford the ARB's that are not affected by the recall, we will return to HCTZ 25 mg daily until issues with the ARB's are resolved.  Smiley Houseman, MD PGY  [place]

## 2017-09-21 ENCOUNTER — Other Ambulatory Visit: Payer: Self-pay

## 2017-09-21 ENCOUNTER — Encounter: Payer: Self-pay | Admitting: Internal Medicine

## 2017-09-21 ENCOUNTER — Ambulatory Visit (INDEPENDENT_AMBULATORY_CARE_PROVIDER_SITE_OTHER): Payer: Self-pay | Admitting: Internal Medicine

## 2017-09-21 VITALS — BP 136/80 | HR 92 | Temp 98.2°F | Ht 62.0 in | Wt 260.2 lb

## 2017-09-21 DIAGNOSIS — G8929 Other chronic pain: Secondary | ICD-10-CM

## 2017-09-21 DIAGNOSIS — R51 Headache: Secondary | ICD-10-CM

## 2017-09-21 DIAGNOSIS — I10 Essential (primary) hypertension: Secondary | ICD-10-CM

## 2017-09-21 DIAGNOSIS — S161XXS Strain of muscle, fascia and tendon at neck level, sequela: Secondary | ICD-10-CM

## 2017-09-21 MED ORDER — HYDROCHLOROTHIAZIDE 25 MG PO TABS: 25.0000 mg | ORAL_TABLET | Freq: Every day | ORAL | 3 refills | Status: DC

## 2017-09-21 MED ORDER — CYCLOBENZAPRINE HCL 5 MG PO TABS: 5.0000 mg | ORAL_TABLET | Freq: Every day | ORAL | 0 refills | Status: DC

## 2017-09-21 MED ORDER — MELOXICAM 7.5 MG PO TABS
7.5000 mg | ORAL_TABLET | Freq: Every day | ORAL | 0 refills | Status: DC
Start: 2017-09-21 — End: 2017-11-01

## 2017-09-21 NOTE — Patient Instructions (Addendum)
We will switch back to hydrochlorothiazide for your  Blood pressure Let's start Mobic 7.5mg  daily for 5 days, then as needed for inflammation You can also use Flexeril 5mg  nightly (muscle relaxer)  Please try to do the stretching exercises.  Follow 3-4 weeks to see how things are going.     Cervical Strain and Sprain Rehab Ask your health care provider which exercises are safe for you. Do exercises exactly as told by your health care provider and adjust them as directed. It is normal to feel mild stretching, pulling, tightness, or discomfort as you do these exercises, but you should stop right away if you feel sudden pain or your pain gets worse.Do not begin these exercises until told by your health care provider. Stretching and range of motion exercises These exercises warm up your muscles and joints and improve the movement and flexibility of your neck. These exercises also help to relieve pain, numbness, and tingling. Exercise A: Cervical side bend  1. Using good posture, sit on a stable chair or stand up. 2. Without moving your shoulders, slowly tilt your left / right ear to your shoulder until you feel a stretch in your neck muscles. You should be looking straight ahead. 3. Hold for __________ seconds. 4. Repeat with the other side of your neck. Repeat __________ times. Complete this exercise __________ times a day. Exercise B: Cervical rotation  1. Using good posture, sit on a stable chair or stand up. 2. Slowly turn your head to the side as if you are looking over your left / right shoulder. ? Keep your eyes level with the ground. ? Stop when you feel a stretch along the side and the back of your neck. 3. Hold for __________ seconds. 4. Repeat this by turning to your other side. Repeat __________ times. Complete this exercise __________ times a day. Exercise C: Thoracic extension and pectoral stretch 1. Roll a towel or a small blanket so it is about 4 inches (10 cm) in  diameter. 2. Lie down on your back on a firm surface. 3. Put the towel lengthwise, under your spine in the middle of your back. It should not be not under your shoulder blades. The towel should line up with your spine from your middle back to your lower back. 4. Put your hands behind your head and let your elbows fall out to your sides. 5. Hold for __________ seconds. Repeat __________ times. Complete this exercise __________ times a day. Strengthening exercises These exercises build strength and endurance in your neck. Endurance is the ability to use your muscles for a long time, even after your muscles get tired. Exercise D: Upper cervical flexion, isometric 1. Lie on your back with a thin pillow behind your head and a small rolled-up towel under your neck. 2. Gently tuck your chin toward your chest and nod your head down to look toward your feet. Do not lift your head off the pillow. 3. Hold for __________ seconds. 4. Release the tension slowly. Relax your neck muscles completely before you repeat this exercise. Repeat __________ times. Complete this exercise __________ times a day. Exercise E: Cervical extension, isometric  1. Stand about 6 inches (15 cm) away from a wall, with your back facing the wall. 2. Place a soft object, about 6-8 inches (15-20 cm) in diameter, between the back of your head and the wall. A soft object could be a small pillow, a ball, or a folded towel. 3. Gently tilt your head back and  press into the soft object. Keep your jaw and forehead relaxed. 4. Hold for __________ seconds. 5. Release the tension slowly. Relax your neck muscles completely before you repeat this exercise. Repeat __________ times. Complete this exercise __________ times a day. Posture and body mechanics  Body mechanics refers to the movements and positions of your body while you do your daily activities. Posture is part of body mechanics. Good posture and healthy body mechanics can help to  relieve stress in your body's tissues and joints. Good posture means that your spine is in its natural S-curve position (your spine is neutral), your shoulders are pulled back slightly, and your head is not tipped forward. The following are general guidelines for applying improved posture and body mechanics to your everyday activities. Standing  When standing, keep your spine neutral and keep your feet about hip-width apart. Keep a slight bend in your knees. Your ears, shoulders, and hips should line up.  When you do a task in which you stand in one place for a long time, place one foot up on a stable object that is 2-4 inches (5-10 cm) high, such as a footstool. This helps keep your spine neutral. Sitting   When sitting, keep your spine neutral and your keep feet flat on the floor. Use a footrest, if necessary, and keep your thighs parallel to the floor. Avoid rounding your shoulders, and avoid tilting your head forward.  When working at a desk or a computer, keep your desk at a height where your hands are slightly lower than your elbows. Slide your chair under your desk so you are close enough to maintain good posture.  When working at a computer, place your monitor at a height where you are looking straight ahead and you do not have to tilt your head forward or downward to look at the screen. Resting When lying down and resting, avoid positions that are most painful for you. Try to support your neck in a neutral position. You can use a contour pillow or a small rolled-up towel. Your pillow should support your neck but not push on it. This information is not intended to replace advice given to you by your health care provider. Make sure you discuss any questions you have with your health care provider. Document Released: 06/20/2005 Document Revised: 02/25/2016 Document Reviewed: 05/27/2015 Elsevier Interactive Patient Education  2018 [employer].  Shoulder Range of Motion Exercises Shoulder  range of motion (ROM) exercises are designed to keep the shoulder moving freely. They are often recommended for people who have shoulder pain. Phase 1 exercises When you are able, do this exercise 5-6 days per week, or as told by your health care provider. Work toward doing 2 sets of 10 swings. Pendulum Exercise How To Do This Exercise Lying Down 1. Lie face-down on a bed with your abdomen close to the side of the bed. 2. Let your arm hang over the side of the bed. 3. Relax your shoulder, arm, and hand. 4. Slowly and gently swing your arm forward and back. Do not use your neck muscles to swing your arm. They should be relaxed. If you are struggling to swing your arm, have someone gently swing it for you. When you do this exercise for the first time, swing your arm at a 15 degree angle for 15 seconds, or swing your arm 10 times. As pain lessens over time, increase the angle of the swing to 30-45 degrees. 5. Repeat steps 1-4 with the other arm.  How To Do This Exercise While Standing 1. Stand next to a sturdy chair or table and hold on to it with your hand. 1. Bend forward at the waist. 2. Bend your knees slightly. 3. Relax your other arm and let it hang limp. 4. Relax the shoulder blade of the arm that is hanging and let it drop. 5. While keeping your shoulder relaxed, use body motion to swing your arm in small circles. The first time you do this exercise, swing your arm for about 30 seconds or 10 times. When you do it next time, swing your arm for a little longer. 6. Stand up tall and relax. 7. Repeat steps 1-7, this time changing the direction of the circles. 2. Repeat steps 1-8 with the other arm.  Phase 2 exercises Do these exercises 3-4 times per day on 5-6 days per week or as told by your health care provider. Work toward holding the stretch for 20 seconds. Stretching Exercise 1 1. Lift your arm straight out in front of you. 2. Bend your arm 90 degrees at the elbow (right angle) so  your forearm goes across your body and looks like the letter "L." 3. Use your other arm to gently pull the elbow forward and across your body. 4. Repeat steps 1-3 with the other arm. Stretching Exercise 2 You will need a towel or rope for this exercise. 1. Bend one arm behind your back with the palm facing outward. 2. Hold a towel with your other hand. 3. Reach the arm that holds the towel above your head, and bend that arm at the elbow. Your wrist should be behind your neck. 4. Use your free hand to grab the free end of the towel. 5. With the higher hand, gently pull the towel up behind you. 6. With the lower hand, pull the towel down behind you. 7. Repeat steps 1-6 with the other arm.  Phase 3 exercises Do each of these exercises at four different times of day (sessions) every day or as told by your health care provider. To begin with, repeat each exercise 5 times (repetitions). Work toward doing 3 sets of 12 repetitions or as told by your health care provider. Strengthening Exercise 1 You will need a light weight for this activity. As you grow stronger, you may use a heavier weight. 1. Standing with a weight in your hand, lift your arm straight out to the side until it is at the same height as your shoulder. 2. Bend your arm at 90 degrees so that your fingers are pointing to the ceiling. 3. Slowly raise your hand until your arm is straight up in the air. 4. Repeat steps 1-3 with the other arm.  Strengthening Exercise 2 You will need a light weight for this activity. As you grow stronger, you may use a heavier weight. 1. Standing with a weight in your hand, gradually move your straight arm in an arc, starting at your side, then out in front of you, then straight up over your head. 2. Gradually move your other arm in an arc, starting at your side, then out in front of you, then straight up over your head. 3. Repeat steps 1-2 with the other arm.  Strengthening Exercise 3 You will need an  elastic band for this activity. As you grow stronger, gradually increase the size of the bands or increase the number of bands that you use at one time. 1. While standing, hold an elastic band in one hand and  raise that arm up in the air. 2. With your other hand, pull down the band until that hand is by your side. 3. Repeat steps 1-2 with the other arm.  This information is not intended to replace advice given to you by your health care provider. Make sure you discuss any questions you have with your health care provider. Document Released: 03/19/2003 Document Revised: 02/14/2016 Document Reviewed: 06/16/2014 Elsevier Interactive Patient Education  2018 [employer]. Provided Flexeril 5 mg

## 2017-09-21 NOTE — Telephone Encounter (Signed)
We discussed this at her office visit today. Refer to visit note.

## 2017-09-22 ENCOUNTER — Encounter: Payer: Self-pay | Admitting: Internal Medicine

## 2017-09-22 NOTE — Assessment & Plan Note (Signed)
A few months ago we switched her from HCTZ to losartan for kidney protection in the setting of type 2 diabetes.  However due to issues with recall and due to the inability to afford the ARB's that are not affected by the recall, we will return to HCTZ 25 mg daily until issues with the ARB's are resolved.

## 2017-09-22 NOTE — Assessment & Plan Note (Signed)
" >>  ASSESSMENT AND PLAN FOR HYPERTENSION, BENIGN SYSTEMIC WRITTEN ON 09/22/2017  2:44 PM BY Trease Bremner G, MD  A few months ago we switched her from HCTZ to losartan  for kidney protection in the setting of type 2 diabetes.  However due to issues with recall and due to the inability to afford the ARB's that are not affected by the recall, we will return to HCTZ 25 mg daily until issues with the ARB's are resolved. "

## 2017-10-11 NOTE — Progress Notes (Deleted)
   [facility] Phone: [phone]   Date of Visit: 10/12/2017   HPI:  ***  ROS: See HPI.  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 Gen: *** HEENT: *** Heart: *** Lungs: *** Neuro: *** Ext: ***  ASSESSMENT/PLAN:  Health maintenance:  -***  No problem-specific Assessment & Plan notes found for this encounter.  FOLLOW UP: Follow up in *** for ***  Smiley Houseman, MD PGY [place]

## 2017-10-12 ENCOUNTER — Ambulatory Visit: Payer: Self-pay | Admitting: Internal Medicine

## 2017-11-01 ENCOUNTER — Other Ambulatory Visit: Payer: Self-pay | Admitting: *Deleted

## 2017-11-01 MED ORDER — MELOXICAM 7.5 MG PO TABS: 7.5000 mg | ORAL_TABLET | Freq: Every day | ORAL | 0 refills | Status: DC

## 2017-11-01 NOTE — Telephone Encounter (Signed)
Called to see how symptoms are doing since I received a refill request for Mobic. Reports symptoms better. She takes 1 tablet about 3-4 times a week. No GI irritation.

## 2017-11-13 ENCOUNTER — Other Ambulatory Visit: Payer: Self-pay | Admitting: Internal Medicine

## 2017-12-25 ENCOUNTER — Telehealth (INDEPENDENT_AMBULATORY_CARE_PROVIDER_SITE_OTHER): Payer: Self-pay | Admitting: Orthopaedic Surgery

## 2017-12-25 NOTE — Telephone Encounter (Signed)
Any significant pain, fever, warmth, redness?

## 2017-12-25 NOTE — Telephone Encounter (Signed)
See message.

## 2017-12-25 NOTE — Telephone Encounter (Signed)
Patient would like for you to call her, she is having some swelling from the surgery [name] did two years ago.  CB#[phone].  Thank you.

## 2017-12-28 NOTE — Telephone Encounter (Signed)
Made appt for her to come in to get evaluated

## 2018-01-02 ENCOUNTER — Ambulatory Visit (INDEPENDENT_AMBULATORY_CARE_PROVIDER_SITE_OTHER): Payer: Self-pay | Admitting: Orthopaedic Surgery

## 2018-01-03 ENCOUNTER — Ambulatory Visit: Payer: Self-pay

## 2018-01-18 ENCOUNTER — Other Ambulatory Visit: Payer: Self-pay

## 2018-01-18 ENCOUNTER — Ambulatory Visit (INDEPENDENT_AMBULATORY_CARE_PROVIDER_SITE_OTHER): Payer: Self-pay | Admitting: Orthopaedic Surgery

## 2018-01-18 ENCOUNTER — Ambulatory Visit (INDEPENDENT_AMBULATORY_CARE_PROVIDER_SITE_OTHER): Payer: Self-pay

## 2018-01-18 ENCOUNTER — Encounter (INDEPENDENT_AMBULATORY_CARE_PROVIDER_SITE_OTHER): Payer: Self-pay | Admitting: Orthopaedic Surgery

## 2018-01-18 ENCOUNTER — Encounter (HOSPITAL_COMMUNITY): Payer: Self-pay | Admitting: *Deleted

## 2018-01-18 VITALS — Ht 62.0 in | Wt 260.0 lb

## 2018-01-18 DIAGNOSIS — M25552 Pain in left hip: Secondary | ICD-10-CM

## 2018-01-18 MED ORDER — TRANEXAMIC ACID 1000 MG/10ML IV SOLN
1000.0000 mg | INTRAVENOUS | Status: DC
  Filled 2018-01-18: qty 10

## 2018-01-18 NOTE — Progress Notes (Signed)
Office Visit Note   Patient: Kristin [name]           Date of Birth: 04-16-1956           MRN: [id number] Visit Date: 01/18/2018              Requested by: Bonnita Hollow, MD [address], [place] [address] PCP: Bonnita Hollow, MD   Assessment & Plan: Visit Diagnoses:  1. Pain in left hip     Plan: Impression is left thigh and hip boils.  Given her history of infection I would like to be proactive about these recent findings.  We will obtain infectious blood work.  I would like to take her to the operating room tomorrow for formal debridement and evaluation to make sure that it is not a deep infection.  Patient in agreement with the plan. Total face to face encounter time was greater than 25 minutes and over half of this time was spent in counseling and/or coordination of care.  Follow-Up Instructions: Return if symptoms worsen or fail to improve.   Orders:  Orders Placed This Encounter  Procedures  . XR HIP UNILAT W OR W/O PELVIS 2-3 VIEWS LEFT  . C-reactive protein  . Sed Rate (ESR)  . CBC with Differential/Platelet   No orders of the defined types were placed in this encounter.     Procedures: No procedures performed   Clinical Data: No additional findings.   Subjective: Chief Complaint  Patient presents with  . Left Hip - Pain    S/p I&D hip 08/03/16    Patient is a 62 year old female who is approximately a year and a half status post a left total hip replacement which was complicated by surgical site infection that required a washout and head and poly-exchange.  She required IV antibiotics and vacuum-assisted wound closure.  She has done well until couple days ago.  She noticed a couple of boils on her surgical scar which had a small amount of drainage.  She denies any constitutional symptoms.  She denies any increase in left hip pain.  She denies any groin or thigh pain.   Review of Systems  Constitutional: Negative.   HENT: Negative.    Eyes: Negative.   Respiratory: Negative.   Cardiovascular: Negative.   Endocrine: Negative.   Musculoskeletal: Negative.   Neurological: Negative.   Hematological: Negative.   Psychiatric/Behavioral: Negative.   All other systems reviewed and are negative.    Objective: Vital Signs: Ht _0  (1.575 m)   Wt 260 lb (117.9 kg)   BMI 47.55 kg/m   Physical Exam  Constitutional: She is oriented to person, place, and time. She appears well-developed and well-nourished.  Pulmonary/Chest: Effort normal.  Neurological: She is alert and oriented to person, place, and time.  Skin: Skin is warm. Capillary refill takes less than 2 seconds.  Psychiatric: She has a normal mood and affect. Her behavior is normal. Judgment and thought content normal.  Nursing note and vitals reviewed.   Ortho Exam Left hip exam shows a fully healed surgical scar.  She does have a few superficial boils in this region.  There is some induration that is tender to palpation.    There is no cellulitis or fluctuance.  She has no significant pain with hip range of motion. Specialty Comments:  No specialty comments available.  Imaging: No results found.   PMFS History: Patient Active Problem List   Diagnosis Date Noted  .  Wound of left leg, sequela 10/03/2016  . 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 Status post left hip replacement 06/01/2016  . Sinus tachycardia 05/19/2016  . History of adenomatous polyp of colon 03/21/2016  . Diabetes ([place]) 01/21/2016  . Thyroid nodule 12/25/2014  . Infiltrate noted on imaging study   . Cervical spine arthritis 11/14/2014  . Primary osteoarthritis of left hip 11/14/2014  . Loss of consciousness ([place]) 11/14/2014  . Musculoskeletal chest and rib pain 03/26/2014  . Rib pain on right side 02/28/2014  . Groin pain 04/18/2013  . UNSPECIFIED RENAL SCLEROSIS 01/16/2009  . GERD, SEVERE 05/25/2007  . OBESITY NOS 04/03/2007  . HYPERLIPIDEMIA 08/31/2006  .  Major depression, recurrent ([place]) 08/31/2006  . 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 Former smoker 08/31/2006  . HYPERTENSION, BENIGN SYSTEMIC 08/31/2006  . GASTRIC ULCER ACUTE WITHOUT HEMORRHAGE 08/31/2006  . CONVULSIONS, SEIZURES, NOS 08/31/2006   Past Medical History:  Diagnosis Date  . Allergy   . Anemia   . Anxiety   . Arthritis   . Back pain, chronic   . Bronchitis   . Cough   . Depression   . GERD (gastroesophageal reflux disease)   . Hyperlipidemia   . Hypertension   . IBS (irritable bowel syndrome)   . Obesity   . Pneumonia 2016  . 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 Seizures ([place])    last >10 yrs ago- recorded 01/18/18  . Somatization disorder   . Tobacco abuse     Family History  Problem Relation Age of Onset  . Other Mother        failed surgery  . Heart Problems Father        poss MI, not sure:per pt  . Alzheimer's disease Maternal Grandmother   . Heart disease Maternal Grandfather   . Colon cancer Maternal Uncle        not sure age of onset    Past Surgical History:  Procedure Laterality Date  . ABDOMINAL HYSTERECTOMY    . ANTERIOR HIP REVISION Left 07/25/2016   Procedure: LEFT HIP IRRIGATION AND DEBRIDEMENT, REVISION OF HEAD AND LINER;  Surgeon: Leandrew Koyanagi, MD;  Location: [facility];  Service: Orthopedics;  Laterality: Left;  . CHOLECYSTECTOMY    . COLONOSCOPY    . INCISION AND DRAINAGE HIP Left 08/03/2016   Procedure: IRRIGATION AND DEBRIDEMENT LEFT HIP, VAC PLACEMENT;  Surgeon: Leandrew Koyanagi, MD;  Location: [facility];  Service: Orthopedics;  Laterality: Left;  . kidney stent Right 2002  . TONSILLECTOMY Bilateral 12/06/2014   Procedure: TONSILLECTOMY, BILATERAL;  Surgeon: Ruby Cola, MD;  Location: [facility];  Service: ENT;  Laterality: Bilateral;  . TONSILLECTOMY    . TOTAL HIP ARTHROPLASTY Left 06/01/2016   Procedure: LEFT TOTAL HIP ARTHROPLASTY ANTERIOR APPROACH;  Surgeon: Leandrew Koyanagi, MD;  Location: [facility];  Service: Orthopedics;   Laterality: Left;  . TUBAL LIGATION     Social History   Occupational History  . Not on file  Tobacco Use  . Smoking status: Former Smoker    Packs/day: 0.25    Years: 0.50    Pack years: 0.12    Types: Cigarettes    Last attempt to quit: 01/27/2014    Years since quitting: 3.9  . Smokeless tobacco: Never Used  Substance and Sexual Activity  . Alcohol use: No  . Drug use: No  . Sexual activity: Not on file

## 2018-01-19 ENCOUNTER — Inpatient Hospital Stay (HOSPITAL_COMMUNITY)
Admission: RE | Admit: 2018-01-19 | Discharge: 2018-01-22 | DRG: 902 | Disposition: A | Discharge status: Home Health | Payer: Self-pay | Source: Ambulatory Visit | Attending: Orthopaedic Surgery | Admitting: Orthopaedic Surgery

## 2018-01-19 ENCOUNTER — Inpatient Hospital Stay (HOSPITAL_COMMUNITY): Payer: Self-pay | Admitting: Anesthesiology

## 2018-01-19 ENCOUNTER — Encounter (HOSPITAL_COMMUNITY): Payer: Self-pay | Admitting: *Deleted

## 2018-01-19 ENCOUNTER — Encounter (HOSPITAL_COMMUNITY)
Admission: RE | Disposition: A | Discharge status: Home Health | Payer: Self-pay | Source: Ambulatory Visit | Attending: Orthopaedic Surgery

## 2018-01-19 DIAGNOSIS — E785 Hyperlipidemia, unspecified: Secondary | ICD-10-CM | POA: Diagnosis present

## 2018-01-19 DIAGNOSIS — D62 Acute posthemorrhagic anemia: Secondary | ICD-10-CM | POA: Diagnosis not present

## 2018-01-19 DIAGNOSIS — T8130XA Disruption of wound, unspecified, initial encounter: Principal | ICD-10-CM | POA: Diagnosis present

## 2018-01-19 DIAGNOSIS — K589 Irritable bowel syndrome without diarrhea: Secondary | ICD-10-CM | POA: Diagnosis present

## 2018-01-19 DIAGNOSIS — Z6841 Body Mass Index (BMI) 40.0 and over, adult: Secondary | ICD-10-CM

## 2018-01-19 DIAGNOSIS — Y849 Medical procedure, unspecified as the cause of abnormal reaction of the patient, or of later complication, without mention of misadventure at the time of the procedure: Secondary | ICD-10-CM | POA: Diagnosis present

## 2018-01-19 DIAGNOSIS — E669 Obesity, unspecified: Secondary | ICD-10-CM | POA: Diagnosis present

## 2018-01-19 DIAGNOSIS — L02426 Furuncle of left lower limb: Secondary | ICD-10-CM | POA: Diagnosis present

## 2018-01-19 DIAGNOSIS — K219 Gastro-esophageal reflux disease without esophagitis: Secondary | ICD-10-CM | POA: Diagnosis present

## 2018-01-19 DIAGNOSIS — Z87891 Personal history of nicotine dependence: Secondary | ICD-10-CM

## 2018-01-19 DIAGNOSIS — I10 Essential (primary) hypertension: Secondary | ICD-10-CM | POA: Diagnosis present

## 2018-01-19 DIAGNOSIS — Z90710 Acquired absence of both cervix and uterus: Secondary | ICD-10-CM

## 2018-01-19 DIAGNOSIS — Z96642 Presence of left artificial hip joint: Secondary | ICD-10-CM | POA: Diagnosis present

## 2018-01-19 DIAGNOSIS — S71002A Unspecified open wound, left hip, initial encounter: Secondary | ICD-10-CM | POA: Diagnosis present

## 2018-01-19 HISTORY — PX: WOUND EXPLORATION: SHX6188

## 2018-01-19 HISTORY — DX: Pneumonia, unspecified organism: J18.9

## 2018-01-19 HISTORY — PX: HIP DEBRIDEMENT: SHX1752

## 2018-01-19 HISTORY — DX: Irritable bowel syndrome, unspecified: K58.9

## 2018-01-19 HISTORY — PX: INCISION AND DRAINAGE HIP: SHX1801

## 2018-01-19 LAB — SURGICAL PCR SCREEN
MRSA, PCR: NEGATIVE
Staphylococcus aureus: POSITIVE — AB

## 2018-01-19 LAB — APTT: aPTT: 33 seconds (ref 24–36)

## 2018-01-19 LAB — PROTIME-INR
INR: 1.13
Prothrombin Time: 14.4 seconds (ref 11.4–15.2)

## 2018-01-19 LAB — COMPREHENSIVE METABOLIC PANEL
ALBUMIN: 4.2 g/dL (ref 3.5–5.0)
ALK PHOS: 143 U/L — AB (ref 38–126)
ALT: 15 U/L (ref 0–44)
AST: 18 U/L (ref 15–41)
Anion gap: 14 (ref 5–15)
BUN: 12 mg/dL (ref 6–20)
CALCIUM: 9.9 mg/dL (ref 8.9–10.3)
CO2: 26 mmol/L (ref 22–32)
Chloride: 100 mmol/L (ref 98–111)
Creatinine, Ser: 1.06 mg/dL — ABNORMAL HIGH (ref 0.44–1.00)
GFR calc Af Amer: >60 mL/min (ref >60)
GFR calc non Af Amer: 56 mL/min — ABNORMAL LOW (ref >60)
GLUCOSE: 145 mg/dL — AB (ref 70–99)
Potassium: 3 mmol/L — ABNORMAL LOW (ref 3.5–5.1)
SODIUM: 140 mmol/L (ref 135–145)
Total Bilirubin: 0.5 mg/dL (ref 0.3–1.2)
Total Protein: 7.8 g/dL (ref 6.5–8.1)

## 2018-01-19 LAB — CBC WITH DIFFERENTIAL/PLATELET
BASOS PCT: 0.8 %
Basophils Absolute: 42 cells/uL (ref 0–200)
EOS PCT: 5.7 %
Eosinophils Absolute: 302 cells/uL (ref 15–500)
HCT: 35.1 % (ref 35.0–45.0)
Hemoglobin: 11.3 g/dL — ABNORMAL LOW (ref 11.7–15.5)
Lymphs Abs: 2200 cells/uL (ref 850–3900)
MCH: 28 pg (ref 27.0–33.0)
MCHC: 32.2 g/dL (ref 32.0–36.0)
MCV: 86.9 fL (ref 80.0–100.0)
MPV: 9.2 fL (ref 7.5–12.5)
Monocytes Relative: 8.3 %
NEUTROS PCT: 43.7 %
Neutro Abs: 2316 cells/uL (ref 1500–7800)
PLATELETS: 379 10*3/uL (ref 140–400)
RBC: 4.04 10*6/uL (ref 3.80–5.10)
RDW: 13.1 % (ref 11.0–15.0)
TOTAL LYMPHOCYTE: 41.5 %
WBC: 5.3 10*3/uL (ref 3.8–10.8)
WBCMIX: 440 {cells}/uL (ref 200–950)

## 2018-01-19 LAB — TYPE AND SCREEN
ABO/RH(D): A POS
Antibody Screen: NEGATIVE

## 2018-01-19 LAB — SEDIMENTATION RATE: Sed Rate: 79 mm/h — ABNORMAL HIGH (ref 0–30)

## 2018-01-19 LAB — C-REACTIVE PROTEIN: CRP: 41.4 mg/L — AB (ref <8.0)

## 2018-01-19 SURGERY — IRRIGATION AND DEBRIDEMENT HIP
Anesthesia: General | Laterality: Left

## 2018-01-19 MED ORDER — HYDROCODONE-ACETAMINOPHEN 7.5-325 MG PO TABS
1.0000 | ORAL_TABLET | ORAL | Status: DC | PRN
  Administered 2018-01-19: 2 via ORAL
  Administered 2018-01-21: 1 via ORAL
  Administered 2018-01-21 (×3): 2 via ORAL
  Filled 2018-01-19 (×4): qty 2
  Filled 2018-01-19: qty 1
  Filled 2018-01-19: qty 2

## 2018-01-19 MED ORDER — LACTATED RINGERS IV SOLN
INTRAVENOUS | Status: DC
  Administered 2018-01-19: 12:00:00 via INTRAVENOUS

## 2018-01-19 MED ORDER — METHOCARBAMOL 500 MG PO TABS
500.0000 mg | ORAL_TABLET | Freq: Four times a day (QID) | ORAL | Status: DC | PRN
  Administered 2018-01-19 – 2018-01-21 (×4): 500 mg via ORAL
  Filled 2018-01-19 (×6): qty 1

## 2018-01-19 MED ORDER — VANCOMYCIN HCL 1000 MG IV SOLR
INTRAVENOUS | Status: AC
  Filled 2018-01-19: qty 1000

## 2018-01-19 MED ORDER — ROCURONIUM BROMIDE 10 MG/ML (PF) SYRINGE
PREFILLED_SYRINGE | INTRAVENOUS | Status: AC
  Filled 2018-01-19: qty 10

## 2018-01-19 MED ORDER — MIDAZOLAM HCL 5 MG/5ML IJ SOLN
INTRAMUSCULAR | Status: DC | PRN
  Administered 2018-01-19: 2 mg via INTRAVENOUS

## 2018-01-19 MED ORDER — PROPOFOL 10 MG/ML IV BOLUS
INTRAVENOUS | Status: DC | PRN
  Administered 2018-01-19: 160 mg via INTRAVENOUS

## 2018-01-19 MED ORDER — HYDROCODONE-ACETAMINOPHEN 5-325 MG PO TABS
1.0000 | ORAL_TABLET | ORAL | Status: DC | PRN
  Administered 2018-01-19 – 2018-01-22 (×5): 2 via ORAL
  Filled 2018-01-19 (×6): qty 2

## 2018-01-19 MED ORDER — DIPHENHYDRAMINE HCL 12.5 MG/5ML PO ELIX: 25.0000 mg | ORAL_SOLUTION | ORAL | Status: DC | PRN

## 2018-01-19 MED ORDER — ONDANSETRON HCL 4 MG/2ML IJ SOLN: 4.0000 mg | Freq: Four times a day (QID) | INTRAMUSCULAR | Status: DC | PRN

## 2018-01-19 MED ORDER — CHLORHEXIDINE GLUCONATE 4 % EX LIQD: 60.0000 mL | Freq: Once | CUTANEOUS | Status: DC

## 2018-01-19 MED ORDER — ACETAMINOPHEN 325 MG PO TABS: 325.0000 mg | ORAL_TABLET | Freq: Four times a day (QID) | ORAL | Status: DC | PRN

## 2018-01-19 MED ORDER — OXYCODONE HCL 5 MG/5ML PO SOLN: 5.0000 mg | Freq: Once | ORAL | Status: DC | PRN

## 2018-01-19 MED ORDER — ACETAMINOPHEN 500 MG PO TABS
500.0000 mg | ORAL_TABLET | Freq: Four times a day (QID) | ORAL | Status: AC
  Administered 2018-01-19 – 2018-01-20 (×3): 500 mg via ORAL
  Filled 2018-01-19 (×3): qty 1

## 2018-01-19 MED ORDER — SODIUM CHLORIDE 0.9 % IV SOLN
INTRAVENOUS | Status: DC
  Administered 2018-01-19: 17:00:00 via INTRAVENOUS

## 2018-01-19 MED ORDER — ONDANSETRON HCL 4 MG/2ML IJ SOLN: 4.0000 mg | Freq: Once | INTRAMUSCULAR | Status: DC | PRN

## 2018-01-19 MED ORDER — ZINC SULFATE 220 (50 ZN) MG PO CAPS
220.0000 mg | ORAL_CAPSULE | Freq: Every day | ORAL | Status: DC
  Administered 2018-01-19 – 2018-01-22 (×4): 220 mg via ORAL
  Filled 2018-01-19 (×4): qty 1

## 2018-01-19 MED ORDER — OXYCODONE HCL 5 MG PO TABS: 5.0000 mg | ORAL_TABLET | Freq: Once | ORAL | Status: DC | PRN

## 2018-01-19 MED ORDER — SORBITOL 70 % SOLN: 30.0000 mL | Freq: Every day | Status: DC | PRN

## 2018-01-19 MED ORDER — SUGAMMADEX SODIUM 500 MG/5ML IV SOLN
INTRAVENOUS | Status: DC | PRN
  Administered 2018-01-19: 250 mg via INTRAVENOUS

## 2018-01-19 MED ORDER — FENTANYL CITRATE (PF) 100 MCG/2ML IJ SOLN
25.0000 ug | INTRAMUSCULAR | Status: DC | PRN
  Administered 2018-01-19 (×3): 50 ug via INTRAVENOUS

## 2018-01-19 MED ORDER — FENTANYL CITRATE (PF) 100 MCG/2ML IJ SOLN
INTRAMUSCULAR | Status: AC
  Filled 2018-01-19: qty 2

## 2018-01-19 MED ORDER — MIDAZOLAM HCL 2 MG/2ML IJ SOLN
INTRAMUSCULAR | Status: AC
  Filled 2018-01-19: qty 2

## 2018-01-19 MED ORDER — FENTANYL CITRATE (PF) 100 MCG/2ML IJ SOLN
INTRAMUSCULAR | Status: AC
  Administered 2018-01-19: 50 ug via INTRAVENOUS
  Filled 2018-01-19: qty 2

## 2018-01-19 MED ORDER — VANCOMYCIN HCL 1000 MG IV SOLR
INTRAVENOUS | Status: DC | PRN
  Administered 2018-01-19: 1000 mg via TOPICAL

## 2018-01-19 MED ORDER — METOCLOPRAMIDE HCL 5 MG PO TABS: 5.0000 mg | ORAL_TABLET | Freq: Three times a day (TID) | ORAL | Status: DC | PRN

## 2018-01-19 MED ORDER — PIPERACILLIN-TAZOBACTAM 3.375 G IVPB
3.3750 g | Freq: Three times a day (TID) | INTRAVENOUS | Status: DC
  Administered 2018-01-19 – 2018-01-21 (×6): 3.375 g via INTRAVENOUS
  Administered 2018-01-21: 09:00:00 via INTRAVENOUS
  Administered 2018-01-22 (×2): 3.375 g via INTRAVENOUS
  Filled 2018-01-19 (×10): qty 50

## 2018-01-19 MED ORDER — TRAZODONE HCL 50 MG PO TABS
25.0000 mg | ORAL_TABLET | Freq: Every day | ORAL | Status: DC
  Administered 2018-01-19 – 2018-01-21 (×3): 25 mg via ORAL
  Filled 2018-01-19 (×3): qty 1

## 2018-01-19 MED ORDER — ROCURONIUM BROMIDE 10 MG/ML (PF) SYRINGE
PREFILLED_SYRINGE | INTRAVENOUS | Status: DC | PRN
  Administered 2018-01-19: 50 mg via INTRAVENOUS

## 2018-01-19 MED ORDER — MAGNESIUM CITRATE PO SOLN
1.0000 | Freq: Once | ORAL | Status: AC | PRN
  Administered 2018-01-22: 1 via ORAL
  Filled 2018-01-19: qty 296

## 2018-01-19 MED ORDER — SODIUM CHLORIDE 0.9 % IV SOLN
INTRAVENOUS | Status: DC | PRN
  Administered 2018-01-19: 25 ug/min via INTRAVENOUS

## 2018-01-19 MED ORDER — LIDOCAINE 2% (20 MG/ML) 5 ML SYRINGE
INTRAMUSCULAR | Status: AC
  Filled 2018-01-19: qty 5

## 2018-01-19 MED ORDER — DOCUSATE SODIUM 100 MG PO CAPS
100.0000 mg | ORAL_CAPSULE | Freq: Two times a day (BID) | ORAL | Status: DC
  Administered 2018-01-19 – 2018-01-22 (×6): 100 mg via ORAL
  Filled 2018-01-19 (×6): qty 1

## 2018-01-19 MED ORDER — MORPHINE SULFATE (PF) 2 MG/ML IV SOLN
0.5000 mg | INTRAVENOUS | Status: DC | PRN
  Administered 2018-01-19: 1 mg via INTRAVENOUS
  Filled 2018-01-19: qty 1

## 2018-01-19 MED ORDER — ACETAMINOPHEN 160 MG/5ML PO SOLN: 325.0000 mg | ORAL | Status: DC | PRN

## 2018-01-19 MED ORDER — SIMVASTATIN 20 MG PO TABS
20.0000 mg | ORAL_TABLET | Freq: Every day | ORAL | Status: DC
  Administered 2018-01-19 – 2018-01-21 (×3): 20 mg via ORAL
  Filled 2018-01-19 (×3): qty 1

## 2018-01-19 MED ORDER — ZINC GLUCONATE 50 MG PO TABS: 50.0000 mg | ORAL_TABLET | Freq: Every day | ORAL | Status: DC

## 2018-01-19 MED ORDER — MEPERIDINE HCL 50 MG/ML IJ SOLN: 6.2500 mg | INTRAMUSCULAR | Status: DC | PRN

## 2018-01-19 MED ORDER — FENTANYL CITRATE (PF) 250 MCG/5ML IJ SOLN
INTRAMUSCULAR | Status: DC | PRN
  Administered 2018-01-19: 100 ug via INTRAVENOUS
  Administered 2018-01-19: 50 ug via INTRAVENOUS

## 2018-01-19 MED ORDER — ONDANSETRON HCL 4 MG/2ML IJ SOLN
INTRAMUSCULAR | Status: DC | PRN
  Administered 2018-01-19 (×2): 4 mg via INTRAVENOUS

## 2018-01-19 MED ORDER — SUGAMMADEX SODIUM 500 MG/5ML IV SOLN
INTRAVENOUS | Status: AC
  Filled 2018-01-19: qty 5

## 2018-01-19 MED ORDER — LIDOCAINE 2% (20 MG/ML) 5 ML SYRINGE
INTRAMUSCULAR | Status: DC | PRN
  Administered 2018-01-19: 60 mg via INTRAVENOUS

## 2018-01-19 MED ORDER — ACETAMINOPHEN 325 MG PO TABS: 325.0000 mg | ORAL_TABLET | ORAL | Status: DC | PRN

## 2018-01-19 MED ORDER — METHOCARBAMOL 1000 MG/10ML IJ SOLN
500.0000 mg | Freq: Four times a day (QID) | INTRAVENOUS | Status: DC | PRN
  Filled 2018-01-19: qty 5

## 2018-01-19 MED ORDER — AMITRIPTYLINE HCL 50 MG PO TABS
150.0000 mg | ORAL_TABLET | Freq: Every day | ORAL | Status: DC
  Administered 2018-01-19 – 2018-01-21 (×3): 150 mg via ORAL
  Filled 2018-01-19 (×3): qty 3

## 2018-01-19 MED ORDER — METOPROLOL TARTRATE 25 MG PO TABS
25.0000 mg | ORAL_TABLET | Freq: Two times a day (BID) | ORAL | Status: DC
  Administered 2018-01-19 – 2018-01-22 (×6): 25 mg via ORAL
  Filled 2018-01-19 (×6): qty 1

## 2018-01-19 MED ORDER — VANCOMYCIN HCL IN DEXTROSE 1-5 GM/200ML-% IV SOLN
1000.0000 mg | Freq: Two times a day (BID) | INTRAVENOUS | Status: DC
  Administered 2018-01-19 – 2018-01-22 (×6): 1000 mg via INTRAVENOUS
  Filled 2018-01-19 (×7): qty 200

## 2018-01-19 MED ORDER — FENTANYL CITRATE (PF) 250 MCG/5ML IJ SOLN
INTRAMUSCULAR | Status: AC
  Filled 2018-01-19: qty 5

## 2018-01-19 MED ORDER — VANCOMYCIN HCL 10 G IV SOLR
1500.0000 mg | Freq: Once | INTRAVENOUS | Status: AC
  Administered 2018-01-19: 1500 mg via INTRAVENOUS
  Filled 2018-01-19: qty 1500

## 2018-01-19 MED ORDER — METOCLOPRAMIDE HCL 5 MG/ML IJ SOLN: 5.0000 mg | Freq: Three times a day (TID) | INTRAMUSCULAR | Status: DC | PRN

## 2018-01-19 MED ORDER — POLYETHYLENE GLYCOL 3350 17 G PO PACK: 17.0000 g | PACK | Freq: Every day | ORAL | Status: DC | PRN

## 2018-01-19 MED ORDER — LACTATED RINGERS IV SOLN
INTRAVENOUS | Status: DC
  Administered 2018-01-19: 10:00:00 via INTRAVENOUS

## 2018-01-19 MED ORDER — SODIUM CHLORIDE 0.9 % IR SOLN
Status: DC | PRN
  Administered 2018-01-19: 6000 mL

## 2018-01-19 MED ORDER — PANTOPRAZOLE SODIUM 40 MG PO TBEC
40.0000 mg | DELAYED_RELEASE_TABLET | Freq: Every day | ORAL | Status: DC
  Administered 2018-01-20 – 2018-01-22 (×3): 40 mg via ORAL
  Filled 2018-01-19 (×3): qty 1

## 2018-01-19 MED ORDER — DEXAMETHASONE SODIUM PHOSPHATE 10 MG/ML IJ SOLN
INTRAMUSCULAR | Status: DC | PRN
  Administered 2018-01-19: 5 mg via INTRAVENOUS

## 2018-01-19 MED ORDER — ONDANSETRON HCL 4 MG PO TABS
4.0000 mg | ORAL_TABLET | Freq: Four times a day (QID) | ORAL | Status: DC | PRN
  Administered 2018-01-19: 4 mg via ORAL
  Filled 2018-01-19: qty 1

## 2018-01-19 MED ORDER — HYDROCHLOROTHIAZIDE 25 MG PO TABS
25.0000 mg | ORAL_TABLET | Freq: Every day | ORAL | Status: DC
  Administered 2018-01-20 – 2018-01-22 (×3): 25 mg via ORAL
  Filled 2018-01-19 (×3): qty 1

## 2018-01-19 SURGICAL SUPPLY — 38 items
COVER SURGICAL LIGHT HANDLE (MISCELLANEOUS) ×3 IMPLANT
DRAPE IMP U-DRAPE 54X76 (DRAPES) ×3 IMPLANT
DRAPE INCISE IOBAN 85X60 (DRAPES) IMPLANT
DRSG AQUACEL AG ADV 3.5X10 (GAUZE/BANDAGES/DRESSINGS) ×2 IMPLANT
DRSG MEPILEX BORDER 4X8 (GAUZE/BANDAGES/DRESSINGS) IMPLANT
DURAPREP 26ML APPLICATOR (WOUND CARE) ×3 IMPLANT
ELECT CAUTERY BLADE 6.4 (BLADE) ×3 IMPLANT
ELECT REM PT RETURN 9FT ADLT (ELECTROSURGICAL) ×3
ELECTRODE REM PT RTRN 9FT ADLT (ELECTROSURGICAL) ×1 IMPLANT
EVACUATOR 1/8 PVC DRAIN (DRAIN) IMPLANT
FACESHIELD WRAPAROUND (MASK) IMPLANT
FACESHIELD WRAPAROUND OR TEAM (MASK) IMPLANT
GAUZE XEROFORM 1X8 LF (GAUZE/BANDAGES/DRESSINGS) ×2 IMPLANT
GLOVE BIOGEL PI IND STRL 7.0 (GLOVE) ×1 IMPLANT
GLOVE BIOGEL PI INDICATOR 7.0 (GLOVE) ×2
GLOVE ECLIPSE 7.0 STRL STRAW (GLOVE) ×3 IMPLANT
GLOVE SKINSENSE NS SZ7.5 (GLOVE) ×4
GLOVE SKINSENSE STRL SZ7.5 (GLOVE) ×2 IMPLANT
GOWN STRL REIN XL XLG (GOWN DISPOSABLE) ×3 IMPLANT
HANDPIECE INTERPULSE COAX TIP (DISPOSABLE) ×3
KIT BASIN OR (CUSTOM PROCEDURE TRAY) ×3 IMPLANT
KIT TURNOVER KIT B (KITS) ×3 IMPLANT
MANIFOLD NEPTUNE II (INSTRUMENTS) ×3 IMPLANT
NS IRRIG 1000ML POUR BTL (IV SOLUTION) ×3 IMPLANT
PACK SHOULDER (CUSTOM PROCEDURE TRAY) ×3 IMPLANT
PACK UNIVERSAL I (CUSTOM PROCEDURE TRAY) ×3 IMPLANT
PAD ARMBOARD 7.5X6 YLW CONV (MISCELLANEOUS) ×6 IMPLANT
SET HNDPC FAN SPRY TIP SCT (DISPOSABLE) ×1 IMPLANT
STAPLER VISISTAT 35W (STAPLE) IMPLANT
SUT PDS AB 1 CT  36 (SUTURE) ×2
SUT PDS AB 1 CT 36 (SUTURE) ×2 IMPLANT
SUT VIC AB 0 CT1 27 (SUTURE) ×3
SUT VIC AB 0 CT1 27XBRD ANBCTR (SUTURE) IMPLANT
SUT VIC AB 1 CTB1 27 (SUTURE) ×2 IMPLANT
SUT VIC AB 2-0 CT1 27 (SUTURE) ×3
SUT VIC AB 2-0 CT1 TAPERPNT 27 (SUTURE) IMPLANT
TOWEL OR 17X24 6PK STRL BLUE (TOWEL DISPOSABLE) ×3 IMPLANT
TOWEL OR 17X26 10 PK STRL BLUE (TOWEL DISPOSABLE) ×3 IMPLANT

## 2018-01-19 NOTE — Transfer of Care (Signed)
Immediate Anesthesia Transfer of Care Note  Patient: Kristin [name]  Procedure(s) Performed: IRRIGATION AND DEBRIDEMENT LEFT HIP (Left ) WOUND EXPLORATION (Left )  Patient Location: PACU  Anesthesia Type:General  Level of Consciousness: awake, alert , oriented and patient cooperative  Airway & Oxygen Therapy: Patient Spontanous Breathing and Patient connected to nasal cannula oxygen  Post-op Assessment: Report given to RN, Post -op Vital signs reviewed and stable and Patient moving all extremities X 4  Post vital signs: Reviewed and stable  Last Vitals:  Vitals Value Taken Time  BP 121/77 01/19/2018  1:45 PM  Temp    Pulse 95 01/19/2018  1:48 PM  Resp 20 01/19/2018  1:48 PM  SpO2 98 % 01/19/2018  1:48 PM  Vitals shown include unvalidated device data.  Last Pain:  Vitals:   01/19/18 1001  TempSrc:   PainSc: 8       Patients Stated Pain Goal: 4 (96/04/54 0981)  Complications: No apparent anesthesia complications

## 2018-01-19 NOTE — Plan of Care (Signed)

## 2018-01-19 NOTE — Progress Notes (Signed)
Pharmacy Antibiotic Note  Kristin [name] is a 62 y.o. female admitted on 01/19/2018 with wound infection.  Pharmacy has been consulted for vancomycin and Zosyn dosing.  Presents with left hip infection. Given 1.5g earlier today of vanc. Normalized CrCl ~80ml/min  Plan: Start Zosyn 3.375 gm IV q8h (4 hour infusion) Start vancomycin 1g IV Q12h Monitor clinical picture, renal function, VT prn F/U C&S, abx deescalation / LOT   Height: 5\' 2"  (157.5 cm) Weight: 260 lb (117.9 kg) IBW/kg (Calculated) : 50.1  Temp (24hrs), Avg:98.1 F (36.7 C), Min:98.1 F (36.7 C), Max:98.1 F (36.7 C)  Recent Labs  Lab 01/18/18 1240 01/19/18 0925  WBC 5.3  --   CREATININE  --  1.06*    Estimated Creatinine Clearance: 68.8 mL/min (A) (by C-G formula based on SCr of 1.06 mg/dL (H)).    Allergies  Allergen Reactions  . Fluconazole Swelling and Other (See Comments)    REACTION: Face swelling  . Robitussin (Alcohol Free) [Guaifenesin] Palpitations and Other (See Comments)    Chest pain    Thank you for allowing pharmacy to be a part of this patient's care.  Reginia Naas 01/19/2018 3:06 PM

## 2018-01-19 NOTE — H&P (Signed)
PREOPERATIVE H&P  Chief Complaint: left hip infection  HPI: Kristin [name] is a 62 y.o. female who presents for surgical treatment of left hip infection.  She denies any changes in medical history.  Past Medical History:  Diagnosis Date  . Allergy   . Anemia   . Anxiety   . Arthritis   . Back pain, chronic   . Bronchitis   . Cough   . Depression   . GERD (gastroesophageal reflux disease)   . Hyperlipidemia   . Hypertension   . IBS (irritable bowel syndrome)   . Obesity   . Pneumonia 2016  . 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 Seizures ([place])    last >10 yrs ago- recorded 01/18/18  . Somatization disorder   . Tobacco abuse    Past Surgical History:  Procedure Laterality Date  . ABDOMINAL HYSTERECTOMY    . ANTERIOR HIP REVISION Left 07/25/2016   Procedure: LEFT HIP IRRIGATION AND DEBRIDEMENT, REVISION OF HEAD AND LINER;  Surgeon: Leandrew Koyanagi, MD;  Location: [facility];  Service: Orthopedics;  Laterality: Left;  . CHOLECYSTECTOMY    . COLONOSCOPY    . INCISION AND DRAINAGE HIP Left 08/03/2016   Procedure: IRRIGATION AND DEBRIDEMENT LEFT HIP, VAC PLACEMENT;  Surgeon: Leandrew Koyanagi, MD;  Location: [facility];  Service: Orthopedics;  Laterality: Left;  . kidney stent Right 2002  . TONSILLECTOMY Bilateral 12/06/2014   Procedure: TONSILLECTOMY, BILATERAL;  Surgeon: Ruby Cola, MD;  Location: [facility];  Service: ENT;  Laterality: Bilateral;  . TONSILLECTOMY    . TOTAL HIP ARTHROPLASTY Left 06/01/2016   Procedure: LEFT TOTAL HIP ARTHROPLASTY ANTERIOR APPROACH;  Surgeon: Leandrew Koyanagi, MD;  Location: [facility];  Service: Orthopedics;  Laterality: Left;  . TUBAL LIGATION     Social History   Socioeconomic History  . Marital status: Married    Spouse name: Not on file  . Number of children: Not on file  . Years of education: Not on file  . Highest education level: Not on file  Occupational History  . Not on file  Social Needs  . Financial resource strain: Not on file    . Food insecurity:    Worry: Not on file    Inability: Not on file  . Transportation needs:    Medical: Not on file    Non-medical: Not on file  Tobacco Use  . Smoking status: Former Smoker    Packs/day: 0.25    Years: 0.50    Pack years: 0.12    Types: Cigarettes    Last attempt to quit: 01/27/2014    Years since quitting: 3.9  . Smokeless tobacco: Never Used  Substance and Sexual Activity  . Alcohol use: No  . Drug use: No  . Sexual activity: Not on file  Lifestyle  . Physical activity:    Days per week: Not on file    Minutes per session: Not on file  . Stress: Not on file  Relationships  . Social connections:    Talks on phone: Not on file    Gets together: Not on file    Attends religious service: Not on file    Active member of club or organization: Not on file    Attends meetings of clubs or organizations: Not on file    Relationship status: Not on file  Other Topics Concern  . Not on file  Social History Narrative  . Not on file  Family History  Problem Relation Age of Onset  . Other Mother        failed surgery  . Heart Problems Father        poss MI, not sure:per pt  . Alzheimer's disease Maternal Grandmother   . Heart disease Maternal Grandfather   . Colon cancer Maternal Uncle        not sure age of onset   Allergies  Allergen Reactions  . Fluconazole Swelling and Other (See Comments)    REACTION: Face swelling  . Robitussin (Alcohol Free) [Guaifenesin] Palpitations and Other (See Comments)    Chest pain   Prior to Admission medications   Medication Sig Start Date End Date Taking? Authorizing Provider  acetaminophen (TYLENOL) 650 MG CR tablet Take 1,300 mg by mouth every 8 (eight) hours as needed for pain.   Yes [provider]  amitriptyline (ELAVIL) 75 MG tablet Take 150 mg by mouth at bedtime.    Yes [provider]  Ascorbic Acid (VITAMIN C PO) Take 1 each by mouth daily.   Yes [provider]   hydrochlorothiazide (HYDRODIURIL) 25 MG tablet Take 1 tablet (25 mg total) by mouth daily. 09/21/17  Yes Smiley Houseman, MD  meloxicam (MOBIC) 7.5 MG tablet Take 1 tablet (7.5 mg total) by mouth daily. Patient taking differently: Take 7.5 mg by mouth daily as needed for pain.  11/01/17  Yes Smiley Houseman, MD  metoprolol tartrate (LOPRESSOR) 25 MG tablet TAKE 1 TABLET BY MOUTH TWICE DAILY 11/13/17  Yes Smiley Houseman, MD  omeprazole (PRILOSEC) 20 MG capsule Take 1 capsule (20 mg total) by mouth daily. 01/07/16  Yes Smiley Houseman, MD  simvastatin (ZOCOR) 20 MG tablet Take 1 tablet (20 mg total) by mouth at bedtime. 07/12/17  Yes Smiley Houseman, MD  traZODone (DESYREL) 50 MG tablet Take 25 mg by mouth at bedtime.   Yes [provider]  zinc gluconate 50 MG tablet Take 50 mg by mouth daily.   Yes [provider]  cyclobenzaprine (FLEXERIL) 5 MG tablet Take 1 tablet (5 mg total) by mouth at bedtime. Patient not taking: Reported on 01/18/2018 09/21/17   Smiley Houseman, MD  ferrous sulfate 325 (65 FE) MG tablet Take 1 tablet (325 mg total) by mouth 2 (two) times daily. Patient not taking: Reported on 01/18/2018 11/29/16   Smiley Houseman, MD  Triamcinolone Acetonide (TRIAMCINOLONE 0.1 % CREAM : EUCERIN) CREA Apply 1 application topically 2 (two) times daily. Patient not taking: Reported on 01/18/2018 08/04/17   Smiley Houseman, MD     Positive ROS: All other systems have been reviewed and were otherwise negative with the exception of those mentioned in the HPI and as above.  Physical Exam: General: Alert, no acute distress Cardiovascular: No pedal edema Respiratory: No cyanosis, no use of accessory musculature GI: abdomen soft Skin: No lesions in the area of chief complaint Neurologic: Sensation intact distally Psychiatric: Patient is competent for consent with normal mood and affect Lymphatic: no lymphedema  MUSCULOSKELETAL: exam  stable  Assessment: left hip infection  Plan: Plan for Procedure(s): IRRIGATION AND DEBRIDEMENT LEFT HIP WOUND EXPLORATION  The risks benefits and alternatives were discussed with the patient including but not limited to the risks of nonoperative treatment, versus surgical intervention including infection, bleeding, nerve injury,  blood clots, cardiopulmonary complications, morbidity, mortality, among others, and they were willing to proceed.   Eduard Roux, MD   01/19/2018 1:37 PM

## 2018-01-19 NOTE — Discharge Instructions (Signed)

## 2018-01-19 NOTE — Anesthesia Procedure Notes (Signed)
Procedure Name: Intubation Date/Time: 01/19/2018 12:09 PM Performed by: Colin Benton, CRNA Pre-anesthesia Checklist: Patient identified, Suction available, Emergency Drugs available and Patient being monitored Patient Re-evaluated:Patient Re-evaluated prior to induction Oxygen Delivery Method: Circle system utilized Preoxygenation: Pre-oxygenation with 100% oxygen Induction Type: IV induction Ventilation: Mask ventilation without difficulty Laryngoscope Size: Miller and 2 Grade View: Grade II Tube type: Oral Tube size: 7.0 mm Number of attempts: 1 Airway Equipment and Method: Stylet Placement Confirmation: ETT inserted through vocal cords under direct vision,  positive ETCO2 and breath sounds checked- equal and bilateral Secured at: 22 cm Tube secured with: Tape Dental Injury: Teeth and Oropharynx as per pre-operative assessment

## 2018-01-19 NOTE — Anesthesia Postprocedure Evaluation (Signed)
Anesthesia Post Note  Patient: ROYAL BEIRNE  Procedure(s) Performed: IRRIGATION AND DEBRIDEMENT LEFT HIP (Left ) WOUND EXPLORATION (Left )     Patient location during evaluation: PACU Anesthesia Type: General Level of consciousness: awake and alert Pain management: pain level controlled Vital Signs Assessment: post-procedure vital signs reviewed and stable Respiratory status: spontaneous breathing, nonlabored ventilation, respiratory function stable and patient connected to nasal cannula oxygen Cardiovascular status: blood pressure returned to baseline and stable Postop Assessment: no apparent nausea or vomiting Anesthetic complications: no    Last Vitals:  Vitals:   01/19/18 0933 01/19/18 1345  BP: (!) 143/85 121/77  Pulse: (!) 108 94  Resp: 20 15  Temp: 36.7 C 36.7 C  SpO2: 100% 98%    Last Pain:  Vitals:   01/19/18 1345  TempSrc:   PainSc: 9                  Kaneisha Ellenberger

## 2018-01-19 NOTE — Op Note (Signed)
   Date of Surgery: 01/19/2018  INDICATIONS: Kristin [name] is a 62 y.o.-year-old female with left hip boils concerning for infection;  The patient did consent to the procedure after discussion of the risks and benefits.  PREOPERATIVE DIAGNOSIS: Left hip boils with superficial wound dehiscence, possible hydradenitis  POSTOPERATIVE DIAGNOSIS: Same.  PROCEDURE:  1.  Excision of skin and subcutaneous tissue 75 cm 2.  Secondary closure of wound dehiscence of previous surgical scar  SURGEON: N. Eduard Roux, M.D.  ASSIST: Kristin [name], [place]; necessary for the timely completion of procedure and due to complexity of procedure.  ANESTHESIA:  general  IV FLUIDS AND URINE: See anesthesia.  ESTIMATED BLOOD LOSS: 100 mL.  IMPLANTS: None  DRAINS: None  COMPLICATIONS: None.  DESCRIPTION OF PROCEDURE: The patient was brought to the operating room and placed supine on the operating table.  The patient had been signed prior to the procedure and this was documented. The patient had the anesthesia placed by the anesthesiologist.  A time-out was performed to confirm that this was the correct patient, site, side and location. The patient did receive antibiotics after intraoperative tissue and culture samples were obtained.  The patient had the operative extremity prepped and draped in the standard surgical fashion.    I first performed sharp excisional debridement of the skin and the boils in the subcutaneous tissue.  There is no evidence of frank pus.  There was areas where appeared to be some herniations through the dermis and some areas of fat necrosis.  I traced this down through the subcutaneous tissue.  There was a small area that tunnel just medially to the surgical wound.  I felt all around this area with my finger and could not find any evidence of deeper penetration down into the hip joint.  All tissues exhibited good punctate bleeding.  The tissues were cultured and sent.  After thorough  debridement with a rongeur and knife I then irrigated the entire surgical wound with 6 L of normal saline using pulse lavage.  I placed 1 g of vancomycin powder throughout the surgical wound.  After this was done hemostasis was obtained I then had to perform secondary closure of the dehiscence of the previous surgical wound with #1 PDS, 2-0 Monocryl, 2-0 nylon.  Sterile dressings were applied.  POSTOPERATIVE PLAN: Patient will be admitted over the weekend.  We will follow her cultures.  In the meantime we will keep her on broad-spectrum empiric antibiotics.  Kristin Cecil, MD [place] 1:31 PM

## 2018-01-19 NOTE — Anesthesia Preprocedure Evaluation (Signed)
Anesthesia Evaluation  Patient identified by MRN, date of birth, ID band Patient awake    Reviewed: Allergy & Precautions, NPO status , Patient's Chart, lab work & pertinent test results  History of Anesthesia Complications Negative for: history of anesthetic complications  Airway Mallampati: II  TM Distance: >3 FB Neck ROM: Full    Dental  (+) Poor Dentition, Missing   Pulmonary former smoker,    breath sounds clear to auscultation       Cardiovascular hypertension, Pt. on medications and Pt. on home beta blockers (-) CHF  Rhythm:Regular  Echo 6/16 Study Conclusions  - Left ventricle: The cavity size was normal. There was mild   concentric hypertrophy. Systolic function was normal. The   estimated ejection fraction was in the range of 55% to 60%. Wall   motion was normal; there were no regional wall motion   abnormalities. Doppler parameters are consistent with abnormal   left ventricular relaxation (grade 1 diastolic dysfunction). - Tricuspid valve: There was moderate regurgitation. - Pulmonary arteries: Systolic pressure was mildly increased. PA   peak pressure: 42 mm Hg (S).    Neuro/Psych  Headaches, Seizures -,  PSYCHIATRIC DISORDERS Anxiety Depression    GI/Hepatic PUD, GERD  Medicated,  Endo/Other  diabetes  Renal/GU Renal InsufficiencyRenal disease     Musculoskeletal  (+) Arthritis ,   Abdominal   Peds  Hematology  (+) anemia ,   Anesthesia Other Findings Broken and decayed right incisor.  Missing upper front right tooth.  Painful decayed tooth upper right near back.  Reproductive/Obstetrics                             Anesthesia Physical  Anesthesia Plan  ASA: III  Anesthesia Plan: General   Post-op Pain Management:    Induction: Intravenous  PONV Risk Score and Plan: 2 and Ondansetron and Treatment may vary due to age or medical condition  Airway Management  Planned: Oral ETT and LMA  Additional Equipment: None  Intra-op Plan:   Post-operative Plan: Extubation in OR  Informed Consent: I have reviewed the patients History and Physical, chart, labs and discussed the procedure including the risks, benefits and alternatives for the proposed anesthesia with the patient or authorized representative who has indicated his/her understanding and acceptance.   Dental advisory given  Plan Discussed with: CRNA, Surgeon and Anesthesiologist  Anesthesia Plan Comments:         Anesthesia Quick Evaluation

## 2018-01-20 ENCOUNTER — Encounter (HOSPITAL_COMMUNITY): Payer: Self-pay | Admitting: Orthopaedic Surgery

## 2018-01-20 LAB — BASIC METABOLIC PANEL
ANION GAP: 9 (ref 5–15)
BUN: 12 mg/dL (ref 6–20)
CALCIUM: 9 mg/dL (ref 8.9–10.3)
CO2: 30 mmol/L (ref 22–32)
Chloride: 99 mmol/L (ref 98–111)
Creatinine, Ser: 1.09 mg/dL — ABNORMAL HIGH (ref 0.44–1.00)
GFR calc non Af Amer: 54 mL/min — ABNORMAL LOW (ref >60)
GLUCOSE: 186 mg/dL — AB (ref 70–99)
Potassium: 3.8 mmol/L (ref 3.5–5.1)
Sodium: 138 mmol/L (ref 135–145)

## 2018-01-20 NOTE — Plan of Care (Signed)
  Problem: Activity: Goal: Risk for activity intolerance will decrease Outcome: Progressing   Problem: Nutrition: Goal: Adequate nutrition will be maintained Outcome: Progressing   Problem: Coping: Goal: Level of anxiety will decrease Outcome: Progressing   Problem: Safety: Goal: Ability to remain free from injury will improve Outcome: Progressing   Problem: Skin Integrity: Goal: Risk for impaired skin integrity will decrease Outcome: Progressing   

## 2018-01-20 NOTE — Evaluation (Signed)
Physical Therapy Evaluation Patient Details Name: Kristin [name] MRN: [id number] DOB: 24-Nov-1955 Today's Date: 01/20/2018   History of Present Illness  Pt is a 62 y.o. female s/p I&D L hip due to infection. 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     Clinical Impression  Pt admitted with above diagnosis. Pt currently with functional limitations due to the deficits listed below (see PT Problem List). PTA pt lived at home with family, independent with mobility. She utilized a [facility] for ambulation in the community. 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 On eval, she required supervision transfers and ambulation. She has 2 steps without rails to enter her home.  Pt will benefit from skilled PT to increase their independence and safety with mobility to allow discharge to the venue listed below.  PT to follow acutely. No follow up services indicated.     Follow Up Recommendations No PT follow up;Supervision for mobility/OOB    Equipment Recommendations  None recommended by PT    Recommendations for Other Services       Precautions / Restrictions Precautions Precautions: None Restrictions Weight Bearing Restrictions: Yes LLE Weight Bearing: Weight bearing as tolerated      Mobility  Bed Mobility Overal bed mobility: Modified Independent                Transfers Overall transfer level: Needs assistance   Transfers: Sit to/from Stand Sit to Stand: Supervision         General transfer comment: supervision for safety  Ambulation/Gait Ambulation/Gait assistance: Supervision Gait Distance (Feet): 200 Feet Assistive device: Rolling walker (2 wheeled) Gait Pattern/deviations: Step-through pattern;Decreased weight shift to left Gait velocity: WFL Gait velocity interpretation: 1.31 - 2.62 ft/sec, indicative of limited community ambulator General Gait Details: verbal cues for RW management  Stairs            Wheelchair  Mobility    Modified Rankin (Stroke Patients Only)       Balance Overall balance assessment: Mild deficits observed, not formally tested                                           Pertinent Vitals/Pain Pain Assessment: Faces Faces Pain Scale: Hurts a little bit Pain Location: L hip Pain Descriptors / Indicators: Sore Pain Intervention(s): Monitored during session;Premedicated before session;Repositioned;Ice applied    Home Living Family/patient expects to be discharged to:: Private residence Living Arrangements: Other relatives;Spouse/significant other Available Help at Discharge: Family;Friend(s);Available 24 hours/day Type of Home: House Home Access: Stairs to enter Entrance Stairs-Rails: None Entrance Stairs-Number of Steps: 2 Home Layout: One level Home Equipment: Walker - 2 wheels;Cane - single point;Grab bars - toilet;Grab bars - tub/shower;Hand held shower head;Bedside commode;Shower seat      Prior Function Level of Independence: Independent with assistive device(s)         Comments: cane for ambulation in the community. 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 Her granddaughter will be assuming care for the aunt while pt recovers.      Hand Dominance   Dominant Hand: Right    Extremity/Trunk Assessment                Communication   Communication: No difficulties  Cognition Arousal/Alertness: Awake/alert Behavior During Therapy: WFL for tasks assessed/performed Overall Cognitive Status: Within Functional Limits for tasks assessed  General Comments      Exercises     Assessment/Plan    PT Assessment Patient needs continued PT services  PT Problem List Decreased mobility;Decreased activity tolerance;Pain;Decreased balance       PT Treatment Interventions Therapeutic activities;Gait training;Therapeutic exercise;Patient/family education;Stair training;Balance  training;Functional mobility training    PT Goals (Current goals can be found in the Care Plan section)  Acute Rehab PT Goals Patient Stated Goal: home PT Goal Formulation: With patient Time For Goal Achievement: 02/03/18 Potential to Achieve Goals: Good    Frequency Min 3X/week   Barriers to discharge        Co-evaluation               AM-PAC PT "6 Clicks" Daily Activity  Outcome Measure Difficulty turning over in bed (including adjusting bedclothes, sheets and blankets)?: A Little Difficulty moving from lying on back to sitting on the side of the bed? : A Little Difficulty sitting down on and standing up from a chair with arms (e.g., wheelchair, bedside commode, etc,.)?: None Help needed moving to and from a bed to chair (including a wheelchair)?: None Help needed walking in hospital room?: A Little Help needed climbing 3-5 steps with a railing? : A Little 6 Click Score: 20    End of Session Equipment Utilized During Treatment: Gait belt Activity Tolerance: Patient tolerated treatment well Patient left: in chair;with call bell/phone within reach Nurse Communication: Mobility status PT Visit Diagnosis: Difficulty in walking, not elsewhere classified (R26.2);Pain Pain - Right/Left: Left Pain - part of body: Hip    Time: 5697-9480 PT Time Calculation (min) (ACUTE ONLY): 19 min   Charges:   PT Evaluation $PT Eval Moderate Complexity: 1 Mod     PT G Codes:        Lorrin Goodell, PT  Office # [phone] Pager [phone]   Lorriane Shire 01/20/2018, 9:01 AM

## 2018-01-20 NOTE — Progress Notes (Signed)
Subjective: 1 Day Post-Op Procedure(s) (LRB): IRRIGATION AND DEBRIDEMENT LEFT HIP (Left) WOUND EXPLORATION (Left) Patient reports pain as mild.  Doing well this am.  No complaints.  Objective: Vital signs in last 24 hours: Temp:  [98.1 F (36.7 C)-98.6 F (37 C)] 98.2 F (36.8 C) (07/20 0105) Pulse Rate:  [92-108] 96 (07/20 0623) Resp:  [14-20] 14 (07/19 2113) BP: (99-143)/(59-85) 114/73 (07/20 0623) SpO2:  [94 %-100 %] 100 % (07/20 0623) Weight:  [260 lb (117.9 kg)] 260 lb (117.9 kg) (07/19 0933)  Intake/Output from previous day: 07/19 0701 - 07/20 0700 In: 801.8 [P.O.:240; I.V.:57.8; IV Piggyback:504] Out: 100 [Blood:100] Intake/Output this shift: No intake/output data recorded.  Recent Labs    01/18/18 1240  HGB 11.3*   Recent Labs    01/18/18 1240  WBC 5.3  RBC 4.04  HCT 35.1  PLT 379   Recent Labs    01/19/18 0925 01/20/18 0319  NA 140 138  K 3.0* 3.8  CL 100 99  CO2 26 30  BUN 12 12  CREATININE 1.06* 1.09*  GLUCOSE 145* 186*  CALCIUM 9.9 9.0   Recent Labs    01/19/18 0925  INR 1.13    Neurologically intact Neurovascular intact Sensation intact distally Intact pulses distally Dorsiflexion/Plantar flexion intact Incision: scant drainage No cellulitis present Compartment soft    Assessment/Plan: 1 Day Post-Op Procedure(s) (LRB): IRRIGATION AND DEBRIDEMENT LEFT HIP (Left) WOUND EXPLORATION (Left) Advance diet Up with PT WBAT LLE Continue empiric abx Awaiting intra-op cultures     Aundra Dubin 01/20/2018, 7:39 AM

## 2018-01-21 LAB — AEROBIC CULTURE  (SUPERFICIAL SPECIMEN)

## 2018-01-21 LAB — AEROBIC CULTURE W GRAM STAIN (SUPERFICIAL SPECIMEN): Culture: NO GROWTH

## 2018-01-21 NOTE — Progress Notes (Signed)
Physical Therapy Treatment Patient Details Name: Kristin [name] MRN: [id number] DOB: 01/08/1956 Today's Date: 01/21/2018    History of Present Illness Pt is a 62 y.o. female s/p I&D L hip due to infection. 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     PT Comments    Pt progressing well. Initiated LE HEP. Handouts provided. Stair training completed using RW. Pt ambulated 220 feet with RW supervision. At baseline, she ambulates without AD in her house and with a cane in the community. Will assess ambulation with cane next session to determine appropriate AD for d/c home. Pt has both RW and cane at home.    Follow Up Recommendations  No PT follow up;Supervision for mobility/OOB     Equipment Recommendations  None recommended by PT    Recommendations for Other Services       Precautions / Restrictions Precautions Precautions: None Restrictions LLE Weight Bearing: Weight bearing as tolerated    Mobility  Bed Mobility Overal bed mobility: Modified Independent                Transfers Overall transfer level: Needs assistance Equipment used: Ambulation equipment used Transfers: Sit to/from Stand Sit to Stand: Supervision         General transfer comment: supervision for safety  Ambulation/Gait Ambulation/Gait assistance: Supervision Gait Distance (Feet): 220 Feet Assistive device: Rolling walker (2 wheeled) Gait Pattern/deviations: Step-through pattern Gait velocity: WFL Gait velocity interpretation: 1.31 - 2.62 ft/sec, indicative of limited community ambulator General Gait Details: steady gait. Will transition to cane next session.   Stairs Stairs: Yes Stairs assistance: Supervision Stair Management: No rails;Forwards;With walker Number of Stairs: 1 General stair comments: Pt has 2 non-consecutive steps to enter house. Both with landing wide enough to fit all 4 legs of RW.   Wheelchair Mobility     Modified Rankin (Stroke Patients Only)       Balance                                            Cognition Arousal/Alertness: Awake/alert Behavior During Therapy: WFL for tasks assessed/performed Overall Cognitive Status: Within Functional Limits for tasks assessed                                        Exercises General Exercises - Lower Extremity Ankle Circles/Pumps: AROM;Both;10 reps Quad Sets: AROM;Both;10 reps Short Arc Quad: AROM;Left;10 reps Heel Slides: AROM;Left;10 reps Hip ABduction/ADduction: AROM;Left;10 reps    General Comments        Pertinent Vitals/Pain Pain Assessment: 0-10 Pain Score: 3  Pain Location: L hip Pain Descriptors / Indicators: Sore Pain Intervention(s): Monitored during session;Repositioned    Home Living                      Prior Function            PT Goals (current goals can now be found in the care plan section) Acute Rehab PT Goals Patient Stated Goal: home PT Goal Formulation: With patient Time For Goal Achievement: 02/03/18 Potential to Achieve Goals: Good Progress towards PT goals: Progressing toward goals    Frequency    Min 3X/week      PT Plan Current plan remains appropriate  Co-evaluation              AM-PAC PT "6 Clicks" Daily Activity  Outcome Measure  Difficulty turning over in bed (including adjusting bedclothes, sheets and blankets)?: None Difficulty moving from lying on back to sitting on the side of the bed? : A Little Difficulty sitting down on and standing up from a chair with arms (e.g., wheelchair, bedside commode, etc,.)?: None Help needed moving to and from a bed to chair (including a wheelchair)?: None Help needed walking in hospital room?: None Help needed climbing 3-5 steps with a railing? : A Little 6 Click Score: 22    End of Session Equipment Utilized During Treatment: Gait belt Activity Tolerance: Patient tolerated treatment  well Patient left: in chair;with call bell/phone within reach Nurse Communication: Mobility status PT Visit Diagnosis: Difficulty in walking, not elsewhere classified (R26.2);Pain Pain - Right/Left: Left Pain - part of body: Hip     Time: 8366-2947 PT Time Calculation (min) (ACUTE ONLY): 17 min  Charges:  $Therapeutic Exercise: 8-22 mins                    G Codes:       Kristin [name], PT  Office # [phone] Pager [phone]    Kristin [name] 01/21/2018, 11:10 AM

## 2018-01-21 NOTE — Progress Notes (Signed)
   Subjective:  Patient reports pain as mild.  Did very well with PT  Objective:   VITALS:   Vitals:   01/20/18 0105 01/20/18 0623 01/20/18 1400 01/20/18 1958  BP: (!) 99/59 114/73 105/67 121/72  Pulse: 94 96 90 (!) 108  Resp:   20   Temp: 98.2 F (36.8 C)  98.2 F (36.8 C) 98.1 F (36.7 C)  TempSrc: Oral  Oral Oral  SpO2: 100% 100% 94% 98%  Weight:      Height:        NVI Incision has some superficial dehiscence and serosanguinous drainage   Lab Results  Component Value Date   WBC 5.3 01/18/2018   HGB 11.3 (L) 01/18/2018   HCT 35.1 01/18/2018   MCV 86.9 01/18/2018   PLT 379 01/18/2018     Assessment/Plan:  2 Days Post-Op   - Expected postop acute blood loss anemia - will monitor for symptoms - will initiate wet to dry's to incision - cultures remain NGTD - continue empiric abx - will discharge home with PO abx once cultures are final  Eduard Roux 01/21/2018, 10:40 AM [phone]

## 2018-01-21 NOTE — Progress Notes (Signed)
Dressing changed wet to dry

## 2018-01-22 LAB — BASIC METABOLIC PANEL
ANION GAP: 7 (ref 5–15)
BUN: 11 mg/dL (ref 6–20)
CO2: 35 mmol/L — AB (ref 22–32)
Calcium: 8.9 mg/dL (ref 8.9–10.3)
Chloride: 97 mmol/L — ABNORMAL LOW (ref 98–111)
Creatinine, Ser: 0.95 mg/dL (ref 0.44–1.00)
GFR calc Af Amer: >60 mL/min (ref >60)
GLUCOSE: 131 mg/dL — AB (ref 70–99)
POTASSIUM: 3.3 mmol/L — AB (ref 3.5–5.1)
Sodium: 139 mmol/L (ref 135–145)

## 2018-01-22 MED ORDER — HYDROCODONE-ACETAMINOPHEN 5-325 MG PO TABS: 1.0000 | ORAL_TABLET | ORAL | 0 refills | Status: DC | PRN

## 2018-01-22 MED ORDER — SULFAMETHOXAZOLE-TRIMETHOPRIM 800-160 MG PO TABS: 1.0000 | ORAL_TABLET | Freq: Two times a day (BID) | ORAL | 0 refills | Status: DC

## 2018-01-22 NOTE — Progress Notes (Signed)
Physical Therapy Treatment Patient Details Name: Kristin [name] MRN: [id number] DOB: 01-01-1956 Today's Date: 01/22/2018    History of Present Illness Pt is a 62 y.o. female s/p I&D L hip due to infection. 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     PT Comments    Pt progressing well. Ambulated 220 feet with SPC and supervision. Pt able to ascend/descend 2 steps with SPC min guard assist.  PT to continue with POC.   Follow Up Recommendations  No PT follow up;Supervision for mobility/OOB     Equipment Recommendations  None recommended by PT    Recommendations for Other Services       Precautions / Restrictions Precautions Precautions: None Restrictions Weight Bearing Restrictions: No LLE Weight Bearing: Weight bearing as tolerated    Mobility  Bed Mobility Overal bed mobility: Modified Independent                Transfers Overall transfer level: Modified independent Equipment used: Ambulation equipment used Transfers: Sit to/from [facility] Sit to Stand: Modified independent (Device/Increase time) Stand pivot transfers: Modified independent (Device/Increase time)       General transfer comment: Pt demo safe technique.  Ambulation/Gait Ambulation/Gait assistance: Supervision Gait Distance (Feet): 220 Feet Assistive device: Straight cane Gait Pattern/deviations: Step-through pattern   Gait velocity interpretation: 1.31 - 2.62 ft/sec, indicative of limited community ambulator General Gait Details: steady gait, supervision for safety   Stairs Stairs: Yes Stairs assistance: Min guard Stair Management: Forwards;With cane;No rails Number of Stairs: 2     Wheelchair Mobility    Modified Rankin (Stroke Patients Only)       Balance Overall balance assessment: Mild deficits observed, not formally tested                                          Cognition  Arousal/Alertness: Awake/alert Behavior During Therapy: WFL for tasks assessed/performed Overall Cognitive Status: Within Functional Limits for tasks assessed                                        Exercises      General Comments        Pertinent Vitals/Pain Pain Assessment: 0-10 Pain Score: 2  Pain Descriptors / Indicators: Pressure Pain Intervention(s): Monitored during session;Repositioned    Home Living                      Prior Function            PT Goals (current goals can now be found in the care plan section) Acute Rehab PT Goals Patient Stated Goal: home PT Goal Formulation: With patient Time For Goal Achievement: 02/03/18 Potential to Achieve Goals: Good Progress towards PT goals: Progressing toward goals    Frequency    Min 3X/week      PT Plan Current plan remains appropriate    Co-evaluation              AM-PAC PT "6 Clicks" Daily Activity  Outcome Measure  Difficulty turning over in bed (including adjusting bedclothes, sheets and blankets)?: None Difficulty moving from lying on back to sitting on the side of the bed? : A Little Difficulty sitting down on and standing up from a  chair with arms (e.g., wheelchair, bedside commode, etc,.)?: None Help needed moving to and from a bed to chair (including a wheelchair)?: None Help needed walking in hospital room?: None Help needed climbing 3-5 steps with a railing? : A Little 6 Click Score: 22    End of Session Equipment Utilized During Treatment: Gait belt Activity Tolerance: Patient tolerated treatment well Patient left: in chair;with call bell/phone within reach Nurse Communication: Mobility status PT Visit Diagnosis: Difficulty in walking, not elsewhere classified (R26.2);Pain Pain - Right/Left: Left Pain - part of body: Hip     Time: 0973-5329 PT Time Calculation (min) (ACUTE ONLY): 12 min  Charges:  $Gait Training: 8-22 mins                    G  Codes:       Lorrin Goodell, PT  Office # [phone] Pager [phone]    Lorriane Shire 01/22/2018, 9:42 AM

## 2018-01-22 NOTE — Progress Notes (Signed)
Subjective: 3 Days Post-Op Procedure(s) (LRB): IRRIGATION AND DEBRIDEMENT LEFT HIP (Left) WOUND EXPLORATION (Left) Patient reports pain as mild.  Doing well this am. No fevers/chills.  Objective: Vital signs in last 24 hours: Temp:  [97.9 F (36.6 C)-98.3 F (36.8 C)] 98.3 F (36.8 C) (07/22 1354) Pulse Rate:  [83-86] 86 (07/22 1354) Resp:  [18] 18 (07/22 1354) BP: (116-122)/(61-68) 122/68 (07/22 1354) SpO2:  [100 %] 100 % (07/22 1354)  Intake/Output from previous day: 07/21 0701 - 07/22 0700 In: 720 [P.O.:720] Out: -  Intake/Output this shift: Total I/O In: 120 [P.O.:120] Out: -   No results for input(s): HGB in the last 72 hours. No results for input(s): WBC, RBC, HCT, PLT in the last 72 hours. Recent Labs    01/20/18 0319 01/22/18 0318  NA 138 139  K 3.8 3.3*  CL 99 97*  CO2 30 35*  BUN 12 11  CREATININE 1.09* 0.95  GLUCOSE 186* 131*  CALCIUM 9.0 8.9   No results for input(s): LABPT, INR in the last 72 hours.  Neurologically intact Neurovascular intact Sensation intact distally Intact pulses distally Dorsiflexion/Plantar flexion intact Incision: dressing C/D/I No cellulitis present Compartment soft    Assessment/Plan: 3 Days Post-Op Procedure(s) (LRB): IRRIGATION AND DEBRIDEMENT LEFT HIP (Left) WOUND EXPLORATION (Left) Advance diet Up with therapy D/C IV fluids Discharge home with home health nurse (social work consult placed) WBAT LLE-no precautions Continue wet to dry dressing changes Wound culture no growth to date    Aundra Dubin 01/22/2018, 2:02 PM

## 2018-01-22 NOTE — Care Management Note (Signed)
Case Management Note  Patient Details  Name: Kristin [name] MRN: [id number] Date of Birth: 1956/04/27  Subjective/Objective: 62 yr old female admitted with left hip infection,underwent irrigation and debridement of left hip with wound exploration.                    Action/Plan:  Case manager spoke with patient concerning discharge plan. Patient will need Home Health RN for wound care and dressing changes. Referral called to [facility] Liaison. Patient is uninsured. She says her grandson will assist her at discharge.    Expected Discharge Date:  01/22/18               Expected Discharge Plan:  [place]  In-House Referral:  NA  Discharge planning Services  CM Consult  Post Acute Care Choice:  Home Health Choice offered to:  Patient  DME Arranged:  (Has DME) DME Agency:  NA  HH Arranged:  RN Joy Agency:  [facility]  Status of Service:  Completed, signed off  If discussed at [place] of Stay Meetings, dates discussed:    Additional Comments:  Ninfa Meeker, RN 01/22/2018, 2:35 PM

## 2018-01-22 NOTE — Plan of Care (Signed)
  Problem: Activity: Goal: Risk for activity intolerance will decrease Outcome: Progressing   Problem: Nutrition: Goal: Adequate nutrition will be maintained Outcome: Progressing   Problem: Coping: Goal: Level of anxiety will decrease Outcome: Progressing   Problem: Safety: Goal: Ability to remain free from injury will improve Outcome: Progressing   

## 2018-01-22 NOTE — Progress Notes (Signed)
Pt is ambulating with walker, left hip wound care done, demonstrated to pt and daughter how to do dressing changes, verbalized understanding. Discharge instructions given to pt. Discharged to home accompanied by daughter.

## 2018-01-22 NOTE — Discharge Summary (Signed)
Patient ID: Kristin [name] MRN: [id number] DOB/AGE: 1956/05/22 62 y.o.  Admit date: 01/19/2018 Discharge date: 01/22/2018  Admission Diagnoses:  Active Problems:   Unspecified open wound, left hip, initial encounter   Discharge Diagnoses:  Same  Past Medical History:  Diagnosis Date  . Allergy   . Anemia   . Anxiety   . Arthritis   . Back pain, chronic   . Bronchitis   . Cough   . Depression   . GERD (gastroesophageal reflux disease)   . Hyperlipidemia   . Hypertension   . IBS (irritable bowel syndrome)   . Obesity   . Pneumonia 2016  . 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 Seizures ([place])    last >10 yrs ago- recorded 01/18/18  . Somatization disorder   . Tobacco abuse     Surgeries: Procedure(s): IRRIGATION AND DEBRIDEMENT LEFT HIP WOUND EXPLORATION on 01/19/2018   Consultants:   Discharged Condition: Improved  Hospital Course: Kristin [name] is an 62 y.o. female who was admitted 01/19/2018 for operative treatment of<principal problem not specified>. Patient has severe unremitting pain that affects sleep, daily activities, and work/hobbies. After pre-op clearance the patient was taken to the operating room on 01/19/2018 and underwent  Procedure(s): IRRIGATION AND DEBRIDEMENT LEFT HIP WOUND EXPLORATION.    Patient was given perioperative antibiotics:  Anti-infectives (From admission, onward)   Start     Dose/Rate Route Frequency Ordered Stop   01/22/18 0000  sulfamethoxazole-trimethoprim (BACTRIM DS,SEPTRA DS) 800-160 MG tablet     1 tablet Oral 2 times daily 01/22/18 0956     01/19/18 2200  vancomycin (VANCOCIN) IVPB 1000 mg/200 mL premix     1,000 mg 200 mL/hr over 60 Minutes Intravenous Every 12 hours 01/19/18 1512     01/19/18 1600  piperacillin-tazobactam (ZOSYN) IVPB 3.375 g     3.375 g 12.5 mL/hr over 240 Minutes Intravenous Every 8 hours 01/19/18 1509     01/19/18 1303  vancomycin (VANCOCIN) powder  Status:  Discontinued       As  needed 01/19/18 1304 01/19/18 1338   01/19/18 1300  vancomycin (VANCOCIN) 1,500 mg in sodium chloride 0.9 % 500 mL IVPB     1,500 mg 250 mL/hr over 120 Minutes Intravenous  Once 01/19/18 0918 01/19/18 1452       Patient was given sequential compression devices, early ambulation, and chemoprophylaxis to prevent DVT.  Patient benefited maximally from hospital stay and there were no complications.    Recent vital signs:  Patient Vitals for the past 24 hrs:  BP Temp Temp src Pulse SpO2  01/22/18 0330 120/61 97.9 F (36.6 C) Oral - -  01/21/18 2011 116/66 98 F (36.7 C) Oral 83 100 %     Recent laboratory studies:  Recent Labs    01/20/18 0319 01/22/18 0318  NA 138 139  K 3.8 3.3*  CL 99 97*  CO2 30 35*  BUN 12 11  CREATININE 1.09* 0.95  GLUCOSE 186* 131*  CALCIUM 9.0 8.9     Discharge Medications:   Allergies as of 01/22/2018      Reactions   Fluconazole Swelling, Other (See Comments)   REACTION: Face swelling   Robitussin (alcohol Free) [guaifenesin] Palpitations, Other (See Comments)   Chest pain      Medication List    STOP taking these medications   acetaminophen 650 MG CR tablet Commonly known as:  TYLENOL   meloxicam 7.5 MG tablet Commonly known as:  MOBIC     TAKE these medications   amitriptyline 75 MG tablet Commonly known as:  ELAVIL Take 150 mg by mouth at bedtime.   cyclobenzaprine 5 MG tablet Commonly known as:  FLEXERIL Take 1 tablet (5 mg total) by mouth at bedtime.   ferrous sulfate 325 (65 FE) MG tablet Take 1 tablet (325 mg total) by mouth 2 (two) times daily.   hydrochlorothiazide 25 MG tablet Commonly known as:  HYDRODIURIL Take 1 tablet (25 mg total) by mouth daily.   HYDROcodone-acetaminophen 5-325 MG tablet Commonly known as:  NORCO/VICODIN Take 1-2 tablets by mouth every 4 (four) hours as needed for moderate pain (pain score 4-6).   metoprolol tartrate 25 MG tablet Commonly known as:  LOPRESSOR TAKE 1 TABLET BY MOUTH  TWICE DAILY   omeprazole 20 MG capsule Commonly known as:  PRILOSEC Take 1 capsule (20 mg total) by mouth daily.   simvastatin 20 MG tablet Commonly known as:  ZOCOR Take 1 tablet (20 mg total) by mouth at bedtime.   sulfamethoxazole-trimethoprim 800-160 MG tablet Commonly known as:  BACTRIM DS,SEPTRA DS Take 1 tablet by mouth 2 (two) times daily.   traZODone 50 MG tablet Commonly known as:  DESYREL Take 25 mg by mouth at bedtime.   triamcinolone 0.1 % cream : eucerin Crea Apply 1 application topically 2 (two) times daily.   VITAMIN C PO Take 1 each by mouth daily.   zinc gluconate 50 MG tablet Take 50 mg by mouth daily.       Diagnostic Studies: Xr Hip Unilat W Or W/o Pelvis 2-3 Views Left  Result Date: 01/18/2018 Stable left total hip replacement   Disposition: Discharge disposition: 01-Home or Self Care         Follow-up Information    Leandrew Koyanagi, MD In 2 weeks.   Specialty:  Orthopedic Surgery Why:  For suture removal, For wound re-check Contact information: [place] [id number] [phone]            Signed: Aundra Dubin 01/22/2018, 1:53 PM

## 2018-01-24 ENCOUNTER — Encounter (INDEPENDENT_AMBULATORY_CARE_PROVIDER_SITE_OTHER): Payer: Self-pay | Admitting: Orthopaedic Surgery

## 2018-01-24 ENCOUNTER — Ambulatory Visit (INDEPENDENT_AMBULATORY_CARE_PROVIDER_SITE_OTHER): Payer: Self-pay | Admitting: Orthopaedic Surgery

## 2018-01-24 DIAGNOSIS — M25552 Pain in left hip: Secondary | ICD-10-CM

## 2018-01-24 LAB — ANAEROBIC CULTURE

## 2018-01-24 LAB — AEROBIC/ANAEROBIC CULTURE (SURGICAL/DEEP WOUND): CULTURE: NO GROWTH

## 2018-01-24 LAB — AEROBIC/ANAEROBIC CULTURE W GRAM STAIN (SURGICAL/DEEP WOUND)

## 2018-01-24 NOTE — Progress Notes (Signed)
Zivah comes in today for wound check.  She is concerned about drainage from her incision.  Denies any increasing pain or fever chills or any worsening symptoms.  The surgical incision has a small superficial dehiscence with serous drainage.  No ascending cellulitis or signs of infection.  We will continue wet-to-dry dressings.  We will keep her on empiric antibiotics.  So far the cultures have been negative.  Recheck in 2 weeks.

## 2018-01-25 ENCOUNTER — Telehealth (INDEPENDENT_AMBULATORY_CARE_PROVIDER_SITE_OTHER): Payer: Self-pay | Admitting: Orthopaedic Surgery

## 2018-01-25 NOTE — Telephone Encounter (Signed)
See message below. Okay on orders?

## 2018-01-25 NOTE — Telephone Encounter (Signed)
Called no answer LMOM ok on orders.

## 2018-01-25 NOTE — Telephone Encounter (Signed)
yes

## 2018-01-25 NOTE — Telephone Encounter (Signed)
Kristin [name] with [facility] request verbal orders for PT : 1 time a week for 3 weeks Kristin [name]'s call back # [phone]

## 2018-01-29 ENCOUNTER — Other Ambulatory Visit (INDEPENDENT_AMBULATORY_CARE_PROVIDER_SITE_OTHER): Payer: Self-pay

## 2018-01-29 ENCOUNTER — Telehealth (INDEPENDENT_AMBULATORY_CARE_PROVIDER_SITE_OTHER): Payer: Self-pay | Admitting: Orthopaedic Surgery

## 2018-01-29 MED ORDER — FLUCONAZOLE 150 MG PO TABS: 150.0000 mg | ORAL_TABLET | Freq: Once | ORAL | 0 refills | Status: AC

## 2018-01-29 NOTE — Telephone Encounter (Signed)
Called patient to advise her Rx sent into pharm

## 2018-01-29 NOTE — Telephone Encounter (Signed)
Patient states the medication Bactrim has given her a yeast infection and she would like for Dr Erlinda Hong to call her in something for the yeast infection to [facility].

## 2018-01-29 NOTE — Telephone Encounter (Signed)
See message below °

## 2018-01-29 NOTE — Telephone Encounter (Signed)
xu patient 

## 2018-01-29 NOTE — Telephone Encounter (Signed)
Fluconazole 150 mg once

## 2018-02-06 ENCOUNTER — Ambulatory Visit (INDEPENDENT_AMBULATORY_CARE_PROVIDER_SITE_OTHER): Payer: Self-pay | Admitting: Family Medicine

## 2018-02-06 ENCOUNTER — Ambulatory Visit (HOSPITAL_COMMUNITY)
Admission: RE | Admit: 2018-02-06 | Discharge: 2018-02-06 | Disposition: A | Discharge status: Home / Self Care | Payer: Self-pay | Source: Ambulatory Visit | Attending: Family Medicine | Admitting: Family Medicine

## 2018-02-06 ENCOUNTER — Other Ambulatory Visit: Payer: Self-pay

## 2018-02-06 ENCOUNTER — Encounter: Payer: Self-pay | Admitting: Family Medicine

## 2018-02-06 VITALS — BP 138/82 | HR 101 | Temp 98.3°F | Ht 62.0 in | Wt 258.2 lb

## 2018-02-06 DIAGNOSIS — E119 Type 2 diabetes mellitus without complications: Secondary | ICD-10-CM

## 2018-02-06 DIAGNOSIS — D649 Anemia, unspecified: Secondary | ICD-10-CM | POA: Insufficient documentation

## 2018-02-06 DIAGNOSIS — R079 Chest pain, unspecified: Secondary | ICD-10-CM

## 2018-02-06 LAB — POCT GLYCOSYLATED HEMOGLOBIN (HGB A1C): HbA1c, POC (controlled diabetic range): 6.9 % (ref 0.0–7.0)

## 2018-02-06 LAB — POCT UA - MICROALBUMIN
Creatinine, POC: 100 mg/dL
Microalbumin Ur, POC: 10 mg/L

## 2018-02-06 MED ORDER — IBUPROFEN 800 MG PO TABS: 800.0000 mg | ORAL_TABLET | Freq: Three times a day (TID) | ORAL | 0 refills | Status: DC | PRN

## 2018-02-06 NOTE — Patient Instructions (Addendum)
It was nice meeting you today Ms. Fana!  You likely have costochondritis due to your recent cough and your physical activity with moving your aunt.  I am prescribing ibuprofen for this.  I am also getting some labs to check for anemia due to your fatigue.  We are checking your kidney function and your A1c for your diabetes as well.  I will contact you if any of these results are abnormal.  Please come back in about three months.  If you have any questions or concerns, please feel free to call the clinic.   Be well,  Dr. Shan Levans

## 2018-02-06 NOTE — Progress Notes (Signed)
Subjective:    Kristin [name] - 62 y.o. female MRN [id number]  Date of birth: 11-08-1955  HPI  Kristin [name] is here for R sided chest pain.  She is also concerned about her fatigue.  R sided chest pain - Started 3 days ago -Thought it was gas pains at first -Tried Pepto-Bismol, magnesium citrate, OTC Prilosec, which helped alleviate the pain  - Located front to back on patient's right chest and radiates up to her right shoulder -Denies shortness of breath -Coughing makes it worse -Has had a cough in the recent past -No diaphoresis, also no nausea or vomiting -Not awakened by the pain at night - Masturbated by rotating to her left side -Has used Tylenol for pain relief  Fatigue -Has had anemia in the past and is worried that this is the reason for her fatigue -Does not currently take iron supplementation -Does wake up frequently to urinate during the night, which she says is on average 3 times per night  Health Maintenance:  Will obtain urine microalbumin and hemoglobin A1c today Health Maintenance Due  Topic Date Due  . OPHTHALMOLOGY EXAM  04/24/1967  . MAMMOGRAM  03/23/2013  . FOOT EXAM  09/08/2017  . INFLUENZA VACCINE  02/01/2018    -  reports that she quit smoking about 4 years ago. Her smoking use included cigarettes. She has a 0.12 pack-year smoking history. She has never used smokeless tobacco. - Review of Systems: Per HPI. - Past Medical History: Patient Active Problem List   Diagnosis Date Noted  . Fatigue associated with anemia 02/07/2018  . Unspecified open wound, left hip, initial encounter 01/19/2018  . Wound of left leg, sequela 10/03/2016  . 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 Status post left hip replacement 06/01/2016  . Sinus tachycardia 05/19/2016  . History of adenomatous polyp of colon 03/21/2016  . Diabetes ([place]) 01/21/2016  . Thyroid nodule 12/25/2014  . Infiltrate noted on imaging study   . Cervical spine  arthritis 11/14/2014  . Primary osteoarthritis of left hip 11/14/2014  . Loss of consciousness ([place]) 11/14/2014  . Chest pain 03/26/2014  . Musculoskeletal chest and rib pain 03/26/2014  . Rib pain on right side 02/28/2014  . Groin pain 04/18/2013  . UNSPECIFIED RENAL SCLEROSIS 01/16/2009  . GERD, SEVERE 05/25/2007  . OBESITY NOS 04/03/2007  . HYPERLIPIDEMIA 08/31/2006  . Major depression, recurrent ([place]) 08/31/2006  . 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 Former smoker 08/31/2006  . HYPERTENSION, BENIGN SYSTEMIC 08/31/2006  . GASTRIC ULCER ACUTE WITHOUT HEMORRHAGE 08/31/2006  . CONVULSIONS, SEIZURES, NOS 08/31/2006   - Medications: reviewed and updated   Objective:   Physical Exam BP 138/82   Pulse (!) 101   Temp 98.3 F (36.8 C) (Oral)   Ht 5\' 2"  (1.575 m)   Wt 258 lb 3.2 oz (117.1 kg)   SpO2 97%   BMI 47.23 kg/m  Gen: NAD, alert, cooperative with exam, appears to be in mild discomfort, not diaphoretic CV: RRR, good S1/S2, no murmur Resp: CTABL, no wheezes, non-labored, chest wall tender to palpation Abd: SNTND, BS present, no guarding or organomegaly     Assessment & Plan:   Chest pain Most likely diagnosis is costochondritis given worsening with coughing, history of recent cough, relief with Tylenol, and chest wall tenderness to palpation.  EKG shows normal sinus rhythm with no concerning ST changes.  Advised to take NSAIDs and Tylenol for pain relief in that the pain should improve within  the next few weeks.  Diabetes ([place]) A1c 6.9, which is at goal.  Will obtain urine microalbumin today.  Will need foot exam at next visit.  Would benefit from addition of metformin at next visit.  Fatigue associated with anemia Will obtain CBC and ferritin level today due to patient's previous history of anemia and current fatigue.    Maia Breslow, M.D. 02/07/2018, 11:29 AM PGY-2, [facility]

## 2018-02-07 ENCOUNTER — Encounter (INDEPENDENT_AMBULATORY_CARE_PROVIDER_SITE_OTHER): Payer: Self-pay | Admitting: Physician Assistant

## 2018-02-07 ENCOUNTER — Ambulatory Visit (INDEPENDENT_AMBULATORY_CARE_PROVIDER_SITE_OTHER): Payer: Self-pay | Admitting: Physician Assistant

## 2018-02-07 DIAGNOSIS — D649 Anemia, unspecified: Secondary | ICD-10-CM | POA: Insufficient documentation

## 2018-02-07 DIAGNOSIS — S81802S Unspecified open wound, left lower leg, sequela: Secondary | ICD-10-CM

## 2018-02-07 LAB — CBC
HEMATOCRIT: 33.2 % — AB (ref 34.0–46.6)
HEMOGLOBIN: 10.5 g/dL — AB (ref 11.1–15.9)
MCH: 28.2 pg (ref 26.6–33.0)
MCHC: 31.6 g/dL (ref 31.5–35.7)
MCV: 89 fL (ref 79–97)
Platelets: 329 10*3/uL (ref 150–450)
RBC: 3.72 x10E6/uL — ABNORMAL LOW (ref 3.77–5.28)
RDW: 15 % (ref 12.3–15.4)
WBC: 5.1 10*3/uL (ref 3.4–10.8)

## 2018-02-07 LAB — FERRITIN: Ferritin: 166 ng/mL — ABNORMAL HIGH (ref 15–150)

## 2018-02-07 MED ORDER — SULFAMETHOXAZOLE-TRIMETHOPRIM 800-160 MG PO TABS: 1.0000 | ORAL_TABLET | Freq: Two times a day (BID) | ORAL | 0 refills | Status: DC

## 2018-02-07 NOTE — Progress Notes (Signed)
Post-Op Visit Note   Patient: Kristin [name]           Date of Birth: May 27, 1956           MRN: [id number] Visit Date: 02/07/2018 PCP: Bonnita Hollow, MD   Assessment & Plan:  Chief Complaint:  Chief Complaint  Patient presents with  . Left Hip - Pain, Follow-up   Visit Diagnoses:  1. Wound of left leg, sequela     Plan: Patient is a pleasant 62 year old female who presents to our clinic today for recheck of the left hip wound.  She is 19 days status post irrigation debridement left hip, date of surgery 01/19/2018.  She has been doing well in regards to pain.  No fevers, chills or any other systemic symptoms.  She was on Bactrim but finished over the weekend.  She does note continuous drainage to the left hip.  She has been compliant with wet-to-dry dressings.  Examination of the left hip reveals a wound dehiscence to the proximal aspect which appears to be unchanged from when she was here 2 weeks ago.  She does have slight nonpurulent drainage.  No tenderness.  No redness.  Nylon sutures in place.  At this point, I will call in 1 more weeks prescription of Bactrim.  She will continue with wet-to-dry dressings.  She will follow-up with [place] in 1 week for recheck and possible suture removal.  Call with any concerning changes in the meantime.  Follow-Up Instructions: Return in about 1 week (around 02/14/2018).   Orders:  No orders of the defined types were placed in this encounter.  Meds ordered this encounter  Medications  . sulfamethoxazole-trimethoprim (BACTRIM DS,SEPTRA DS) 800-160 MG tablet    Sig: Take 1 tablet by mouth 2 (two) times daily.    Dispense:  28 tablet    Refill:  0    Imaging: No results found.  PMFS History: Patient Active Problem List   Diagnosis Date Noted  . Unspecified open wound, left hip, initial encounter 01/19/2018  . Wound of left leg, sequela 10/03/2016  . 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 Status post left hip  replacement 06/01/2016  . Sinus tachycardia 05/19/2016  . History of adenomatous polyp of colon 03/21/2016  . Diabetes ([place]) 01/21/2016  . Thyroid nodule 12/25/2014  . Infiltrate noted on imaging study   . Cervical spine arthritis 11/14/2014  . Primary osteoarthritis of left hip 11/14/2014  . Loss of consciousness ([place]) 11/14/2014  . Musculoskeletal chest and rib pain 03/26/2014  . Rib pain on right side 02/28/2014  . Groin pain 04/18/2013  . UNSPECIFIED RENAL SCLEROSIS 01/16/2009  . GERD, SEVERE 05/25/2007  . OBESITY NOS 04/03/2007  . HYPERLIPIDEMIA 08/31/2006  . Major depression, recurrent ([facility]) 08/31/2006  . 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 Former smoker 08/31/2006  . HYPERTENSION, BENIGN SYSTEMIC 08/31/2006  . GASTRIC ULCER ACUTE WITHOUT HEMORRHAGE 08/31/2006  . CONVULSIONS, SEIZURES, NOS 08/31/2006   Past Medical History:  Diagnosis Date  . Allergy   . Anemia   . Anxiety   . Arthritis   . Back pain, chronic   . Bronchitis   . Cough   . Depression   . GERD (gastroesophageal reflux disease)   . Hyperlipidemia   . Hypertension   . IBS (irritable bowel syndrome)   . Obesity   . Pneumonia 2016  . 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 Seizures ([place])    last >10  yrs ago- recorded 01/18/18  . Somatization disorder   . Tobacco abuse     Family History  Problem Relation Age of Onset  . Other Mother        failed surgery  . Heart Problems Father        poss MI, not sure:per pt  . Alzheimer's disease Maternal Grandmother   . Heart disease Maternal Grandfather   . Colon cancer Maternal Uncle        not sure age of onset    Past Surgical History:  Procedure Laterality Date  . ABDOMINAL HYSTERECTOMY    . ANTERIOR HIP REVISION Left 07/25/2016   Procedure: LEFT HIP IRRIGATION AND DEBRIDEMENT, REVISION OF HEAD AND LINER;  Surgeon: Leandrew Koyanagi, MD;  Location: [facility];  Service: Orthopedics;  Laterality: Left;  . CHOLECYSTECTOMY    . COLONOSCOPY    .  HIP DEBRIDEMENT Left 01/19/2018   Procedure(s) Performed: IRRIGATION AND DEBRIDEMENT LEFT HIP (Left )  . INCISION AND DRAINAGE HIP Left 08/03/2016   Procedure: IRRIGATION AND DEBRIDEMENT LEFT HIP, VAC PLACEMENT;  Surgeon: Leandrew Koyanagi, MD;  Location: [facility];  Service: Orthopedics;  Laterality: Left;  . INCISION AND DRAINAGE HIP Left 01/19/2018   Procedure: IRRIGATION AND DEBRIDEMENT LEFT HIP;  Surgeon: Leandrew Koyanagi, MD;  Location: [facility];  Service: Orthopedics;  Laterality: Left;  . kidney stent Right 2002  . TONSILLECTOMY Bilateral 12/06/2014   Procedure: TONSILLECTOMY, BILATERAL;  Surgeon: Ruby Cola, MD;  Location: [facility];  Service: ENT;  Laterality: Bilateral;  . TONSILLECTOMY    . TOTAL HIP ARTHROPLASTY Left 06/01/2016   Procedure: LEFT TOTAL HIP ARTHROPLASTY ANTERIOR APPROACH;  Surgeon: Leandrew Koyanagi, MD;  Location: [facility];  Service: Orthopedics;  Laterality: Left;  . TUBAL LIGATION    . WOUND EXPLORATION Left 01/19/2018   Procedure: WOUND EXPLORATION;  Surgeon: Leandrew Koyanagi, MD;  Location: [facility];  Service: Orthopedics;  Laterality: Left;   Social History   Occupational History  . Not on file  Tobacco Use  . Smoking status: Former Smoker    Packs/day: 0.25    Years: 0.50    Pack years: 0.12    Types: Cigarettes    Last attempt to quit: 01/27/2014    Years since quitting: 4.0  . Smokeless tobacco: Never Used  Substance and Sexual Activity  . Alcohol use: No  . Drug use: No  . Sexual activity: Not on file

## 2018-02-07 NOTE — Assessment & Plan Note (Signed)
Most likely diagnosis is costochondritis given worsening with coughing, history of recent cough, relief with Tylenol, and chest wall tenderness to palpation.  EKG shows normal sinus rhythm with no concerning ST changes.  Advised to take NSAIDs and Tylenol for pain relief in that the pain should improve within the next few weeks.

## 2018-02-07 NOTE — Assessment & Plan Note (Signed)
Will obtain CBC and ferritin level today due to patient's previous history of anemia and current fatigue.

## 2018-02-07 NOTE — Assessment & Plan Note (Addendum)
A1c 6.9, which is at goal.  Will obtain urine microalbumin today.  Will need foot exam at next visit.  Would benefit from addition of metformin at next visit.

## 2018-02-08 ENCOUNTER — Telehealth: Payer: Self-pay | Admitting: Family Medicine

## 2018-02-08 ENCOUNTER — Other Ambulatory Visit: Payer: Self-pay | Admitting: Family Medicine

## 2018-02-08 MED ORDER — METFORMIN HCL 500 MG PO TABS: 500.0000 mg | ORAL_TABLET | Freq: Every day | ORAL | 11 refills | Status: DC

## 2018-02-08 NOTE — Telephone Encounter (Signed)
Called patient regarding her mild anemia on her recent CBC.  Her ferritin was high-normal, so I told her that her diabetes may be causing her mild anemia.  She is agreeable to trying metformin 500 mg once per day.  Also left instructions on bottle to increase to 500 mg BID after the first two weeks.

## 2018-02-15 ENCOUNTER — Ambulatory Visit (INDEPENDENT_AMBULATORY_CARE_PROVIDER_SITE_OTHER): Payer: Self-pay | Admitting: Physician Assistant

## 2018-02-15 ENCOUNTER — Encounter (INDEPENDENT_AMBULATORY_CARE_PROVIDER_SITE_OTHER): Payer: Self-pay | Admitting: Orthopaedic Surgery

## 2018-02-15 DIAGNOSIS — Z6841 Body Mass Index (BMI) 40.0 and over, adult: Secondary | ICD-10-CM | POA: Insufficient documentation

## 2018-02-15 DIAGNOSIS — S81802S Unspecified open wound, left lower leg, sequela: Secondary | ICD-10-CM

## 2018-02-15 NOTE — Progress Notes (Signed)
Post-Op Visit Note   Patient: Kristin [name]           Date of Birth: [birth year]           MRN: [id number] Visit Date: 02/15/2018 PCP: Bonnita Hollow, MD   Assessment & Plan:  Chief Complaint:  Chief Complaint  Patient presents with  . Follow-up   Visit Diagnoses:  1. Wound of left leg, sequela   2. Morbid obesity ([place])   3. Body mass index 45.0-49.9, adult [facility])     Plan: Patient is a pleasant 62 year old female who presents to our clinic today approximately 3-1/2 weeks status post irrigation debridement left hip, date of surgery 01/19/2018.  She has been doing very well.  No pain.  No fevers, chills or any other systemic symptoms.  She is still taking her Bactrim.  She has been compliant with wet-to-dry dressing changes.  Examination of the left hip reveals a well-healing surgical incision, however she does have a slight wound dehiscence to the proximal aspect which is healing nicely.  No evidence of infection.   Today, nylon sutures were removed.  She will continue with her wet-to-dry dressing changes for the next 3 weeks.  She will finish her Bactrim prescription.  She will follow-up with [place] in 3 weeks time for recheck.  She will call with any concerning symptoms in the meantime.  The patient meets the AMA guidelines for Morbid (severe) obesity with a BMI > 40.0 and I have recommended weight loss.  Follow-Up Instructions: Return in about 3 weeks (around 03/08/2018).   Orders:  No orders of the defined types were placed in this encounter.  No orders of the defined types were placed in this encounter.   Imaging: No results found.  PMFS History: Patient Active Problem List   Diagnosis Date Noted  . Body mass index 45.0-49.9, adult ([place]) 02/15/2018  . Fatigue associated with anemia 02/07/2018  . Unspecified open wound, left hip, initial encounter 01/19/2018  . Wound of left leg, sequela 10/03/2016  . 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 Status  post left hip replacement 06/01/2016  . Sinus tachycardia 05/19/2016  . History of adenomatous polyp of colon 03/21/2016  . Diabetes ([place]) 01/21/2016  . Thyroid nodule 12/25/2014  . Infiltrate noted on imaging study   . Cervical spine arthritis 11/14/2014  . Primary osteoarthritis of left hip 11/14/2014  . Loss of consciousness ([place]) 11/14/2014  . Chest pain 03/26/2014  . Musculoskeletal chest and rib pain 03/26/2014  . Rib pain on right side 02/28/2014  . Groin pain 04/18/2013  . UNSPECIFIED RENAL SCLEROSIS 01/16/2009  . GERD, SEVERE 05/25/2007  . Morbid obesity ([place]) 04/03/2007  . HYPERLIPIDEMIA 08/31/2006  . Major depression, recurrent ([place]) 08/31/2006  . 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 Former smoker 08/31/2006  . HYPERTENSION, BENIGN SYSTEMIC 08/31/2006  . GASTRIC ULCER ACUTE WITHOUT HEMORRHAGE 08/31/2006  . CONVULSIONS, SEIZURES, NOS 08/31/2006   Past Medical History:  Diagnosis Date  . Allergy   . Anemia   . Anxiety   . Arthritis   . Back pain, chronic   . Bronchitis   . Cough   . Depression   . GERD (gastroesophageal reflux disease)   . Hyperlipidemia   . Hypertension   . IBS (irritable bowel syndrome)   . Obesity   . Pneumonia 2016  . 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 Seizures ([place])    last >10 yrs ago- recorded 01/18/18  .  Somatization disorder   . Tobacco abuse     Family History  Problem Relation Age of Onset  . Other Mother        failed surgery  . Heart Problems Father        poss MI, not sure:per pt  . Alzheimer's disease Maternal Grandmother   . Heart disease Maternal Grandfather   . Colon cancer Maternal Uncle        not sure age of onset    Past Surgical History:  Procedure Laterality Date  . ABDOMINAL HYSTERECTOMY    . ANTERIOR HIP REVISION Left 07/25/2016   Procedure: LEFT HIP IRRIGATION AND DEBRIDEMENT, REVISION OF HEAD AND LINER;  Surgeon: Leandrew Koyanagi, MD;  Location: [facility];  Service: Orthopedics;  Laterality:  Left;  . CHOLECYSTECTOMY    . COLONOSCOPY    . HIP DEBRIDEMENT Left 01/19/2018   Procedure(s) Performed: IRRIGATION AND DEBRIDEMENT LEFT HIP (Left )  . INCISION AND DRAINAGE HIP Left 08/03/2016   Procedure: IRRIGATION AND DEBRIDEMENT LEFT HIP, VAC PLACEMENT;  Surgeon: Leandrew Koyanagi, MD;  Location: [facility];  Service: Orthopedics;  Laterality: Left;  . INCISION AND DRAINAGE HIP Left 01/19/2018   Procedure: IRRIGATION AND DEBRIDEMENT LEFT HIP;  Surgeon: Leandrew Koyanagi, MD;  Location: [place];  Service: Orthopedics;  Laterality: Left;  . kidney stent Right 2002  . TONSILLECTOMY Bilateral 12/06/2014   Procedure: TONSILLECTOMY, BILATERAL;  Surgeon: Ruby Cola, MD;  Location: [place];  Service: ENT;  Laterality: Bilateral;  . TONSILLECTOMY    . TOTAL HIP ARTHROPLASTY Left 06/01/2016   Procedure: LEFT TOTAL HIP ARTHROPLASTY ANTERIOR APPROACH;  Surgeon: Leandrew Koyanagi, MD;  Location: [place];  Service: Orthopedics;  Laterality: Left;  . TUBAL LIGATION    . WOUND EXPLORATION Left 01/19/2018   Procedure: WOUND EXPLORATION;  Surgeon: Leandrew Koyanagi, MD;  Location: [place];  Service: Orthopedics;  Laterality: Left;   Social History   Occupational History  . Not on file  Tobacco Use  . Smoking status: Former Smoker    Packs/day: 0.25    Years: 0.50    Pack years: 0.12    Types: Cigarettes    Last attempt to quit: 01/27/2014    Years since quitting: 4.0  . Smokeless tobacco: Never Used  Substance and Sexual Activity  . Alcohol use: No  . Drug use: No  . Sexual activity: Not on file

## 2018-02-16 LAB — FUNGUS CULTURE WITH STAIN

## 2018-02-16 LAB — FUNGUS CULTURE RESULT

## 2018-02-16 LAB — FUNGAL ORGANISM REFLEX

## 2018-02-19 LAB — FUNGUS CULTURE WITH STAIN

## 2018-02-19 LAB — FUNGAL ORGANISM REFLEX

## 2018-02-19 LAB — FUNGUS CULTURE RESULT

## 2018-02-20 ENCOUNTER — Other Ambulatory Visit: Payer: Self-pay | Admitting: Internal Medicine

## 2018-02-21 ENCOUNTER — Ambulatory Visit: Payer: Self-pay

## 2018-02-28 ENCOUNTER — Ambulatory Visit: Payer: Self-pay

## 2018-03-08 ENCOUNTER — Encounter (INDEPENDENT_AMBULATORY_CARE_PROVIDER_SITE_OTHER): Payer: Self-pay | Admitting: Orthopaedic Surgery

## 2018-03-08 ENCOUNTER — Ambulatory Visit (INDEPENDENT_AMBULATORY_CARE_PROVIDER_SITE_OTHER): Payer: Self-pay | Admitting: Orthopaedic Surgery

## 2018-03-08 DIAGNOSIS — S81802S Unspecified open wound, left lower leg, sequela: Secondary | ICD-10-CM

## 2018-03-08 DIAGNOSIS — Z6841 Body Mass Index (BMI) 40.0 and over, adult: Secondary | ICD-10-CM

## 2018-03-08 NOTE — Progress Notes (Signed)
Kristin [name] returns today for a wound check.  Overall she is doing well.  She has completed her antibiotics.  Overall the wound has significantly improved.  This is pretty much completely healed.  At this point I would like her to continue to put some mupirocin on this twice a day.  I would like to reevaluate this in a month.

## 2018-03-22 ENCOUNTER — Other Ambulatory Visit: Payer: Self-pay | Admitting: Family Medicine

## 2018-03-24 ENCOUNTER — Other Ambulatory Visit: Payer: Self-pay | Admitting: Family Medicine

## 2018-04-06 ENCOUNTER — Encounter (INDEPENDENT_AMBULATORY_CARE_PROVIDER_SITE_OTHER): Payer: Self-pay | Admitting: Orthopaedic Surgery

## 2018-04-06 ENCOUNTER — Ambulatory Visit (INDEPENDENT_AMBULATORY_CARE_PROVIDER_SITE_OTHER): Payer: Self-pay | Admitting: Orthopaedic Surgery

## 2018-04-06 VITALS — Ht 62.0 in | Wt 258.2 lb

## 2018-04-06 DIAGNOSIS — Z6841 Body Mass Index (BMI) 40.0 and over, adult: Secondary | ICD-10-CM

## 2018-04-06 DIAGNOSIS — S81802S Unspecified open wound, left lower leg, sequela: Secondary | ICD-10-CM

## 2018-04-06 NOTE — Progress Notes (Signed)
Post-Op Visit Note   Patient: Kristin [name]           Date of Birth: 05/22/1956           MRN: [id number] Visit Date: 04/06/2018 PCP: Bonnita Hollow, MD   Assessment & Plan:  Chief Complaint:  Chief Complaint  Patient presents with  . Right Hip - Follow-up   Visit Diagnoses:  1. Wound of left leg, sequela   2. Morbid obesity ([place])   3. Body mass index 40.0-44.9, adult [facility])     Plan: Patient is a pleasant 62 year old female who presents to our clinic today 2.5 months status post irrigation debridement left hip, date of surgery 01/19/2018.  She has been doing well.  No pain to the left hip.  No fevers or chills.  She has finished her dressing changes and denies any drainage to the left hip incision.  Examination of the left hip reveals a well-healed surgical incision without evidence of cellulitis or infection.  No drainage.  Negative logroll.  She is neurovascular intact distally.  At this point, she will follow-up with [place] in July 2020 for repeat evaluation and x-ray of the left hip joint.  She will call with any worsening symptoms in the meantime.  Follow-Up Instructions: Return in about 9 months (around 01/05/2019).   Orders:  No orders of the defined types were placed in this encounter.  No orders of the defined types were placed in this encounter.   Imaging: No new imaging  PMFS History: Patient Active Problem List   Diagnosis Date Noted  . Body mass index 40.0-44.9, adult ([place]) 02/15/2018  . Fatigue associated with anemia 02/07/2018  . Unspecified open wound, left hip, initial encounter 01/19/2018  . Wound of left leg, sequela 10/03/2016  . 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 Status post left hip replacement 06/01/2016  . Sinus tachycardia 05/19/2016  . History of adenomatous polyp of colon 03/21/2016  . Diabetes ([place]) 01/21/2016  . Thyroid nodule 12/25/2014  . Infiltrate noted on imaging study   . Cervical spine arthritis 11/14/2014    . Primary osteoarthritis of left hip 11/14/2014  . Loss of consciousness ([place]) 11/14/2014  . Chest pain 03/26/2014  . Musculoskeletal chest and rib pain 03/26/2014  . Rib pain on right side 02/28/2014  . Groin pain 04/18/2013  . UNSPECIFIED RENAL SCLEROSIS 01/16/2009  . GERD, SEVERE 05/25/2007  . Morbid obesity ([place]) 04/03/2007  . HYPERLIPIDEMIA 08/31/2006  . Major depression, recurrent ([place]) 08/31/2006  . 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 Former smoker 08/31/2006  . HYPERTENSION, BENIGN SYSTEMIC 08/31/2006  . GASTRIC ULCER ACUTE WITHOUT HEMORRHAGE 08/31/2006  . CONVULSIONS, SEIZURES, NOS 08/31/2006   Past Medical History:  Diagnosis Date  . Allergy   . Anemia   . Anxiety   . Arthritis   . Back pain, chronic   . Bronchitis   . Cough   . Depression   . GERD (gastroesophageal reflux disease)   . Hyperlipidemia   . Hypertension   . IBS (irritable bowel syndrome)   . Obesity   . Pneumonia 2016  . 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 Seizures ([place])    last >10 yrs ago- recorded 01/18/18  . Somatization disorder   . Tobacco abuse     Family History  Problem Relation Age of Onset  . Other Mother        failed surgery  . Heart Problems Father  poss MI, not sure:per pt  . Alzheimer's disease Maternal Grandmother   . Heart disease Maternal Grandfather   . Colon cancer Maternal Uncle        not sure age of onset    Past Surgical History:  Procedure Laterality Date  . ABDOMINAL HYSTERECTOMY    . ANTERIOR HIP REVISION Left 07/25/2016   Procedure: LEFT HIP IRRIGATION AND DEBRIDEMENT, REVISION OF HEAD AND LINER;  Surgeon: Leandrew Koyanagi, MD;  Location: [place];  Service: Orthopedics;  Laterality: Left;  . CHOLECYSTECTOMY    . COLONOSCOPY    . HIP DEBRIDEMENT Left 01/19/2018   Procedure(s) Performed: IRRIGATION AND DEBRIDEMENT LEFT HIP (Left )  . INCISION AND DRAINAGE HIP Left 08/03/2016   Procedure: IRRIGATION AND DEBRIDEMENT LEFT HIP, VAC PLACEMENT;   Surgeon: Leandrew Koyanagi, MD;  Location: [place];  Service: Orthopedics;  Laterality: Left;  . INCISION AND DRAINAGE HIP Left 01/19/2018   Procedure: IRRIGATION AND DEBRIDEMENT LEFT HIP;  Surgeon: Leandrew Koyanagi, MD;  Location: [place];  Service: Orthopedics;  Laterality: Left;  . kidney stent Right 2002  . TONSILLECTOMY Bilateral 12/06/2014   Procedure: TONSILLECTOMY, BILATERAL;  Surgeon: Ruby Cola, MD;  Location: [place];  Service: ENT;  Laterality: Bilateral;  . TONSILLECTOMY    . TOTAL HIP ARTHROPLASTY Left 06/01/2016   Procedure: LEFT TOTAL HIP ARTHROPLASTY ANTERIOR APPROACH;  Surgeon: Leandrew Koyanagi, MD;  Location: [place];  Service: Orthopedics;  Laterality: Left;  . TUBAL LIGATION    . WOUND EXPLORATION Left 01/19/2018   Procedure: WOUND EXPLORATION;  Surgeon: Leandrew Koyanagi, MD;  Location: [place];  Service: Orthopedics;  Laterality: Left;   Social History   Occupational History  . Not on file  Tobacco Use  . Smoking status: Former Smoker    Packs/day: 0.25    Years: 0.50    Pack years: 0.12    Types: Cigarettes    Last attempt to quit: 01/27/2014    Years since quitting: 4.1  . Smokeless tobacco: Never Used  Substance and Sexual Activity  . Alcohol use: No  . Drug use: No  . Sexual activity: Not on file

## 2018-04-12 ENCOUNTER — Ambulatory Visit (INDEPENDENT_AMBULATORY_CARE_PROVIDER_SITE_OTHER): Payer: Self-pay | Admitting: *Deleted

## 2018-04-12 DIAGNOSIS — Z23 Encounter for immunization: Secondary | ICD-10-CM

## 2018-04-30 ENCOUNTER — Other Ambulatory Visit (HOSPITAL_COMMUNITY): Payer: Self-pay | Admitting: *Deleted

## 2018-04-30 DIAGNOSIS — Z1231 Encounter for screening mammogram for malignant neoplasm of breast: Secondary | ICD-10-CM

## 2018-05-28 ENCOUNTER — Other Ambulatory Visit: Payer: Self-pay | Admitting: Family Medicine

## 2018-07-12 ENCOUNTER — Ambulatory Visit: Payer: Self-pay

## 2018-07-13 ENCOUNTER — Ambulatory Visit (INDEPENDENT_AMBULATORY_CARE_PROVIDER_SITE_OTHER): Payer: Self-pay | Admitting: Orthopaedic Surgery

## 2018-07-27 ENCOUNTER — Ambulatory Visit (INDEPENDENT_AMBULATORY_CARE_PROVIDER_SITE_OTHER): Payer: Self-pay | Admitting: Family Medicine

## 2018-07-27 ENCOUNTER — Other Ambulatory Visit: Payer: Self-pay

## 2018-07-27 VITALS — BP 130/80 | HR 94 | Temp 98.2°F | Wt 258.0 lb

## 2018-07-27 DIAGNOSIS — R21 Rash and other nonspecific skin eruption: Secondary | ICD-10-CM | POA: Insufficient documentation

## 2018-07-27 MED ORDER — CETIRIZINE HCL 10 MG PO TABS: 10.0000 mg | ORAL_TABLET | Freq: Every day | ORAL | 0 refills | Status: DC

## 2018-07-27 NOTE — Progress Notes (Signed)
Acute Office Visit  Subjective:    Patient ID: Kristin [name], female    DOB: Oct 04, 1955, 63 y.o.   MRN: [id number]  Chief Complaint  Patient presents with  . Rash    Rash  This is a new problem. Episode onset: 3. The problem has been gradually worsening since onset. Location: face, between eyes. The rash is characterized by itchiness and redness. It is unknown if there was an exposure to a precipitant. Pertinent negatives include no anorexia, congestion, cough, diarrhea, eye pain, facial edema, fatigue, fever, joint pain, nail changes, rhinorrhea, shortness of breath, sore throat or vomiting. Past treatments include topical steroids (1% hydrocortisone for 1 day). The treatment provided mild relief. There is no history of allergies, asthma, eczema or varicella.   Review of Systems  Constitutional: Negative for fatigue and fever.  HENT: Negative for congestion, rhinorrhea and sore throat.   Eyes: Negative for pain.  Respiratory: Negative for cough and shortness of breath.   Gastrointestinal: Negative for anorexia, diarrhea and vomiting.  Musculoskeletal: Negative for joint pain.  Skin: Positive for rash. Negative for nail changes.      Objective:    Physical Exam  HENT:  Head: Normocephalic and atraumatic.  Skin: Rash noted.    BP 130/80   Pulse 94   Temp 98.2 F (36.8 C) (Oral)   Wt 258 lb (117 kg)   SpO2 99%   BMI 47.19 kg/m  Wt Readings from Last 3 Encounters:  07/27/18 258 lb (117 kg)  04/06/18 258 lb 3.2 oz (117.1 kg)  02/06/18 258 lb 3.2 oz (117.1 kg)    Health Maintenance Due  Topic Date Due  . OPHTHALMOLOGY EXAM  04/24/1967  . MAMMOGRAM  03/23/2013  . FOOT EXAM  09/08/2017  . TETANUS/TDAP  06/13/2018    There are no preventive care reminders to display for this patient.   Lab Results  Component Value Date   TSH 0.652 08/04/2017   Lab Results  Component Value Date   WBC 5.1 02/06/2018   HGB 10.5 (L) 02/06/2018   HCT 33.2 (L) 02/06/2018   MCV 89 02/06/2018   PLT 329 02/06/2018   Lab Results  Component Value Date   NA 139 01/22/2018   K 3.3 (L) 01/22/2018   CO2 35 (H) 01/22/2018   GLUCOSE 131 (H) 01/22/2018   BUN 11 01/22/2018   CREATININE 0.95 01/22/2018   BILITOT 0.5 01/19/2018   ALKPHOS 143 (H) 01/19/2018   AST 18 01/19/2018   ALT 15 01/19/2018   PROT 7.8 01/19/2018   ALBUMIN 4.2 01/19/2018   CALCIUM 8.9 01/22/2018   ANIONGAP 7 01/22/2018   Lab Results  Component Value Date   CHOL 132 04/17/2017   Lab Results  Component Value Date   HDL 49 04/17/2017   Lab Results  Component Value Date   LDLCALC 61 04/17/2017   Lab Results  Component Value Date   TRIG 109 04/17/2017   Lab Results  Component Value Date   CHOLHDL 2.7 04/17/2017   Lab Results  Component Value Date   HGBA1C 6.9 02/06/2018       Assessment & Plan:   Problem List Items Addressed This Visit      Musculoskeletal and Integument   Facial rash - Primary    Patient presenting with 3 days of erythematous, pruritic glabellar region.  Not associated with fevers, chills, arthralgias.  Patient tried 1 day of 1% hydrocortisone, with unknown improvement.  Recommend continued trial of hydrocortisone for several days  with topical emollient addition to that.  Will also prescribe Zyrtec to assist with pruritus.  If not improved, recommend patient return for KOH scraping and potential use of antifungal.      Relevant Medications   cetirizine (ZYRTEC ALLERGY) 10 MG tablet       Meds ordered this encounter  Medications  . cetirizine (ZYRTEC ALLERGY) 10 MG tablet    Sig: Take 1 tablet (10 mg total) by mouth daily.    Dispense:  30 tablet    Refill:  0     Bonnita Hollow, MD

## 2018-07-27 NOTE — Assessment & Plan Note (Signed)
Patient presenting with 3 days of erythematous, pruritic glabellar region.  Not associated with fevers, chills, arthralgias.  Patient tried 1 day of 1% hydrocortisone, with unknown improvement.  Recommend continued trial of hydrocortisone for several days with topical emollient addition to that.  Will also prescribe Zyrtec to assist with pruritus.  If not improved, recommend patient return for KOH scraping and potential use of antifungal.

## 2018-08-07 ENCOUNTER — Encounter (HOSPITAL_COMMUNITY): Payer: Self-pay

## 2018-08-07 ENCOUNTER — Ambulatory Visit
Admission: RE | Admit: 2018-08-07 | Discharge: 2018-08-07 | Disposition: A | Discharge status: Home / Self Care | Payer: No Typology Code available for payment source | Source: Ambulatory Visit | Attending: Obstetrics and Gynecology | Admitting: Obstetrics and Gynecology

## 2018-08-07 ENCOUNTER — Encounter (HOSPITAL_COMMUNITY): Payer: Self-pay | Admitting: *Deleted

## 2018-08-07 ENCOUNTER — Ambulatory Visit (HOSPITAL_COMMUNITY)
Admission: RE | Admit: 2018-08-07 | Discharge: 2018-08-07 | Disposition: A | Discharge status: Home / Self Care | Payer: Self-pay | Source: Ambulatory Visit | Attending: Obstetrics and Gynecology | Admitting: Obstetrics and Gynecology

## 2018-08-07 VITALS — BP 150/100

## 2018-08-07 DIAGNOSIS — Z1239 Encounter for other screening for malignant neoplasm of breast: Secondary | ICD-10-CM

## 2018-08-07 DIAGNOSIS — Z1231 Encounter for screening mammogram for malignant neoplasm of breast: Secondary | ICD-10-CM

## 2018-08-07 NOTE — Addendum Note (Signed)
Encounter addended by: Loletta Parish, RN on: 08/07/2018 10:39 AM  Actions taken: Clinical Note Signed

## 2018-08-07 NOTE — Patient Instructions (Signed)
Explained breast self awareness with [name]. Patient did not need a Pap smear today due to patient has a history of a hysterectomy for benign reasons. Let patient know that she doesn't need any further Pap smears due to her history of a hysterectomy for benign reasons. Referred patient to the [facility] for a screening mammogram. Appointment scheduled for Tuesday, August 07, 2018 at 1000. Patient aware of appointment and will be there. Let patient know the [facility] will follow up with her within the next couple weeks with results of mammogram by letter or phone. [name] verbalized understanding.  Laqueta Bonaventura, Arvil [place], RN 9:57 AM

## 2018-08-07 NOTE — Progress Notes (Addendum)
No complaints today.   Pap Smear: Pap smear not completed today. Last Pap smear was 03/10/2002 at  and normal. Per patient has no history of an abnormal Pap smear. Patient has a history of a hysterectomy in May 2003 for fibroids. Patient doesn't need any further Pap smears due to her history of a hysterectomy for benign reasons per BCCCP and ACOG guidelines. Last two Pap smear result is in Epic.  Physical exam: Breasts Left breast is slightly larger than the right breast that per patient is normal for her. No skin abnormalities bilateral breasts. No nipple retraction bilateral breasts. No nipple discharge bilateral breasts. No lymphadenopathy. No lumps palpated bilateral breasts. No complaints of pain or tenderness on exam. Referred patient to the [facility] for a screening mammogram. Appointment scheduled for Tuesday, August 07, 2018 at 1000.        Pelvic/Bimanual No Pap smear completed today since patient has a history of a hysterectomy for benign reasons. Pap smear not indicated per BCCCP guidelines.   Smoking History: Patient is a former smoker that quit in July 2015.  Patient Navigation: Patient education provided. Access to services provided for patient through [facility] program.   Colorectal Cancer Screening: Patient had a colonoscopy completed 03/16/2016. No complaints today. FIT Test given to patient to complete and return to BCCCP.  Breast and Cervical Cancer Risk Assessment: Patient has no family history of breast cancer, known genetic mutations, or radiation treatment to the chest before age 9. Patient has no history of cervical dysplasia, immunocompromised, or DES exposure in-utero.  Risk Assessment    Risk Scores      08/07/2018   Last edited by: Armond Hang, LPN   5-year risk: 1.1 %   Lifetime risk: 5.2 %

## 2018-08-08 ENCOUNTER — Encounter: Payer: Self-pay | Admitting: Family Medicine

## 2018-08-15 ENCOUNTER — Ambulatory Visit (INDEPENDENT_AMBULATORY_CARE_PROVIDER_SITE_OTHER): Payer: Self-pay | Admitting: Orthopaedic Surgery

## 2018-08-22 ENCOUNTER — Other Ambulatory Visit: Payer: Self-pay

## 2018-08-22 ENCOUNTER — Ambulatory Visit (INDEPENDENT_AMBULATORY_CARE_PROVIDER_SITE_OTHER): Payer: Self-pay

## 2018-08-22 ENCOUNTER — Ambulatory Visit (INDEPENDENT_AMBULATORY_CARE_PROVIDER_SITE_OTHER): Payer: Self-pay | Admitting: Orthopaedic Surgery

## 2018-08-22 ENCOUNTER — Encounter (INDEPENDENT_AMBULATORY_CARE_PROVIDER_SITE_OTHER): Payer: Self-pay | Admitting: Orthopaedic Surgery

## 2018-08-22 DIAGNOSIS — Z96642 Presence of left artificial hip joint: Secondary | ICD-10-CM

## 2018-08-22 DIAGNOSIS — M1612 Unilateral primary osteoarthritis, left hip: Secondary | ICD-10-CM

## 2018-08-22 NOTE — Progress Notes (Signed)
Office Visit Note   Patient: Kristin [name]           Date of Birth: [birth year]           MRN: [id number] Visit Date: 08/22/2018              Requested by: Kristin Hollow, MD [address], [place] [address] PCP: Kristin Hollow, MD   Assessment & Plan: Visit Diagnoses:  1. Primary osteoarthritis of left hip   2. Presence of left artificial hip joint     Plan: Impression is left hip pain suspect overuse due to having to care for her aunt.  Just to be sure I would like to have her see Dr. Junius Roads tomorrow so that he can ultrasound this area to rule out any fluid collections.  She is in agreement with this plan.  We will follow-up with Dr. Junius Roads regarding the ultrasound results.  Follow-Up Instructions: Return needs appt with hilts tomorrow.   Orders:  Orders Placed This Encounter  Procedures  . XR HIP UNILAT W OR W/O PELVIS 2-3 VIEWS LEFT   No orders of the defined types were placed in this encounter.     Procedures: No procedures performed   Clinical Data: No additional findings.   Subjective: Chief Complaint  Patient presents with  . Left Leg - Pain    [name] is a 63 year old female comes in with 2-day history of increasing left thigh pain around the surgical scar.  She says that when she lays down on her right side she feels a hard knot underneath the scar.  She denies any fevers or chills or warmth or swelling or drainage.  She states that she has recently been tending to her aunt and so she may have overdone it.  She denies any pain in the groin or hip area.  Denies any constitutional symptoms.   Review of Systems  Constitutional: Negative.   HENT: Negative.   Eyes: Negative.   Respiratory: Negative.   Cardiovascular: Negative.   Endocrine: Negative.   Musculoskeletal: Negative.   Neurological: Negative.   Hematological: Negative.   Psychiatric/Behavioral: Negative.   All other systems reviewed and are  negative.    Objective: Vital Signs: There were no vitals taken for this visit.  Physical Exam Vitals signs and nursing note reviewed.  Constitutional:      Appearance: She is well-developed.  Pulmonary:     Effort: Pulmonary effort is normal.  Skin:    General: Skin is warm.     Capillary Refill: Capillary refill takes less than 2 seconds.  Neurological:     Mental Status: She is alert and oriented to person, place, and time.  Psychiatric:        Behavior: Behavior normal.        Thought Content: Thought content normal.        Judgment: Judgment normal.     Ortho Exam Left hip and thigh exam shows no cellulitis or fluctuance.  Surgical scar is fully healed.  I am able to palpate the dense scar tissue that is causing her pain but this does not feel like there is any infection.  It really feels like its normal scar tissue.  I moves her hip all around and this does not appear to be causing her any problems or pain. Specialty Comments:  No specialty comments available.  Imaging: Xr Hip Unilat W Or W/o Pelvis 2-3 Views Left  Result Date: 08/22/2018 Stable left total hip  replacement without complication.    PMFS History: Patient Active Problem List   Diagnosis Date Noted  . Facial rash 07/27/2018  . Body mass index 40.0-44.9, adult ([place]) 02/15/2018  . Fatigue associated with anemia 02/07/2018  . Unspecified open wound, left hip, initial encounter 01/19/2018  . Wound of left leg, sequela 10/03/2016  . 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 Status post left hip replacement 06/01/2016  . Sinus tachycardia 05/19/2016  . History of adenomatous polyp of colon 03/21/2016  . Diabetes ([place]) 01/21/2016  . Thyroid nodule 12/25/2014  . Infiltrate noted on imaging study   . Cervical spine arthritis 11/14/2014  . Primary osteoarthritis of left hip 11/14/2014  . Loss of consciousness ([place]) 11/14/2014  . Chest pain 03/26/2014  . Musculoskeletal chest and rib  pain 03/26/2014  . Rib pain on right side 02/28/2014  . Groin pain 04/18/2013  . UNSPECIFIED RENAL SCLEROSIS 01/16/2009  . GERD, SEVERE 05/25/2007  . Morbid obesity ([place]) 04/03/2007  . HYPERLIPIDEMIA 08/31/2006  . Major depression, recurrent ([place]) 08/31/2006  . 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 Former smoker 08/31/2006  . HYPERTENSION, BENIGN SYSTEMIC 08/31/2006  . GASTRIC ULCER ACUTE WITHOUT HEMORRHAGE 08/31/2006  . CONVULSIONS, SEIZURES, NOS 08/31/2006   Past Medical History:  Diagnosis Date  . Allergy   . Anemia   . Anxiety   . Arthritis   . Back pain, chronic   . Bronchitis   . Cough   . Depression   . GERD (gastroesophageal reflux disease)   . Hyperlipidemia   . Hypertension   . IBS (irritable bowel syndrome)   . Obesity   . Pneumonia 2016  . 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 Seizures ([place])    last >10 yrs ago- recorded 01/18/18  . Somatization disorder   . Tobacco abuse     Family History  Problem Relation Age of Onset  . Other Mother        failed surgery  . Heart Problems Father        poss MI, not sure:per pt  . Alzheimer's disease Maternal Grandmother   . Heart disease Maternal Grandfather   . Colon cancer Maternal Uncle        not sure age of onset    Past Surgical History:  Procedure Laterality Date  . ABDOMINAL HYSTERECTOMY    . ANTERIOR HIP REVISION Left 07/25/2016   Procedure: LEFT HIP IRRIGATION AND DEBRIDEMENT, REVISION OF HEAD AND LINER;  Surgeon: Leandrew Koyanagi, MD;  Location: [facility];  Service: Orthopedics;  Laterality: Left;  . CHOLECYSTECTOMY    . COLONOSCOPY    . HIP DEBRIDEMENT Left 01/19/2018   Procedure(s) Performed: IRRIGATION AND DEBRIDEMENT LEFT HIP (Left )  . INCISION AND DRAINAGE HIP Left 08/03/2016   Procedure: IRRIGATION AND DEBRIDEMENT LEFT HIP, VAC PLACEMENT;  Surgeon: Leandrew Koyanagi, MD;  Location: [facility];  Service: Orthopedics;  Laterality: Left;  . INCISION AND DRAINAGE HIP Left 01/19/2018   Procedure:  IRRIGATION AND DEBRIDEMENT LEFT HIP;  Surgeon: Leandrew Koyanagi, MD;  Location: [facility];  Service: Orthopedics;  Laterality: Left;  . kidney stent Right 2002  . TONSILLECTOMY Bilateral 12/06/2014   Procedure: TONSILLECTOMY, BILATERAL;  Surgeon: Ruby Cola, MD;  Location: [facility];  Service: ENT;  Laterality: Bilateral;  . TONSILLECTOMY    . TOTAL HIP ARTHROPLASTY Left 06/01/2016   Procedure: LEFT TOTAL HIP ARTHROPLASTY ANTERIOR APPROACH;  Surgeon: Leandrew Koyanagi, MD;  Location: [facility];  Service: Orthopedics;  Laterality: Left;  . TUBAL LIGATION    . WOUND EXPLORATION Left 01/19/2018   Procedure: WOUND EXPLORATION;  Surgeon: Leandrew Koyanagi, MD;  Location: [facility];  Service: Orthopedics;  Laterality: Left;   Social History   Occupational History  . Not on file  Tobacco Use  . Smoking status: Former Smoker    Packs/day: 0.25    Years: 0.50    Pack years: 0.12    Types: Cigarettes    Last attempt to quit: 01/27/2014    Years since quitting: 4.5  . Smokeless tobacco: Never Used  Substance and Sexual Activity  . Alcohol use: No  . Drug use: No  . Sexual activity: Not on file

## 2018-08-23 LAB — SPECIMEN STATUS REPORT

## 2018-08-23 LAB — FECAL OCCULT BLOOD, IMMUNOCHEMICAL: FECAL OCCULT BLD: NEGATIVE

## 2018-08-29 ENCOUNTER — Encounter (HOSPITAL_COMMUNITY): Payer: Self-pay | Admitting: *Deleted

## 2018-08-29 NOTE — Progress Notes (Signed)
Letter mailed to patient with negative Fit Test results.  

## 2018-09-03 ENCOUNTER — Other Ambulatory Visit: Payer: Self-pay

## 2018-09-03 MED ORDER — HYDROCHLOROTHIAZIDE 25 MG PO TABS: 25.0000 mg | ORAL_TABLET | Freq: Every day | ORAL | 3 refills | Status: DC

## 2018-09-03 MED ORDER — SIMVASTATIN 20 MG PO TABS: 20.0000 mg | ORAL_TABLET | Freq: Every day | ORAL | 3 refills | Status: DC

## 2018-10-01 ENCOUNTER — Telehealth (INDEPENDENT_AMBULATORY_CARE_PROVIDER_SITE_OTHER): Payer: Self-pay | Admitting: Orthopaedic Surgery

## 2018-10-01 NOTE — Telephone Encounter (Signed)
Patient called left voicemail message that she is having drainage on left leg asked is there something she can use to put on it so that she will not have to come into the office. The number to contact patient is [phone]

## 2018-10-01 NOTE — Telephone Encounter (Signed)
Please advise 

## 2018-10-02 ENCOUNTER — Encounter (INDEPENDENT_AMBULATORY_CARE_PROVIDER_SITE_OTHER): Payer: Self-pay | Admitting: Orthopaedic Surgery

## 2018-10-02 ENCOUNTER — Ambulatory Visit (INDEPENDENT_AMBULATORY_CARE_PROVIDER_SITE_OTHER): Payer: Self-pay | Admitting: Orthopaedic Surgery

## 2018-10-02 ENCOUNTER — Other Ambulatory Visit: Payer: Self-pay

## 2018-10-02 DIAGNOSIS — M25552 Pain in left hip: Secondary | ICD-10-CM

## 2018-10-02 MED ORDER — MUPIROCIN 2 % EX OINT: 1.0000 "application " | TOPICAL_OINTMENT | Freq: Two times a day (BID) | CUTANEOUS | 0 refills | Status: DC

## 2018-10-02 NOTE — Telephone Encounter (Signed)
Can you have here come in asap

## 2018-10-02 NOTE — Progress Notes (Signed)
Office Visit Note   Patient: Kristin [name]           Date of Birth: 06-Mar-1956           MRN: [id number] Visit Date: 10/02/2018              Requested by: Bonnita Hollow, MD [address], [place] [address] PCP: Bonnita Hollow, MD   Assessment & Plan: Visit Diagnoses:  1. Pain in left hip     Plan: Impression is left surgical wound skin excoriation.  Bactroban ointment was applied today and covered.  She will continue to do this twice a day until this is healed.  Patient states overall has been itching recently associated with scratching it which is consistent with objective findings.  Follow-Up Instructions: Return if symptoms worsen or fail to improve.   Orders:  No orders of the defined types were placed in this encounter.  Meds ordered this encounter  Medications  . mupirocin ointment (BACTROBAN) 2 %    Sig: Apply 1 application topically 2 (two) times daily.    Dispense:  22 g    Refill:  0      Procedures: No procedures performed   Clinical Data: No additional findings.   Subjective: Chief Complaint  Patient presents with  . Left Hip - Pain, Drainage from Incision    Margarit comes in today for 4 evaluation of her left hip surgical wound.  She states that she noticed some drainage since last Friday.  Denies any constitutional symptoms.  Denies any increasing pain.  She is 2 years status post left total hip replacement.  She has been putting Neosporin on this area.  She has trouble seeing the entire area due to her pannus.   Review of Systems  Constitutional: Negative.   HENT: Negative.   Eyes: Negative.   Respiratory: Negative.   Cardiovascular: Negative.   Endocrine: Negative.   Musculoskeletal: Negative.   Neurological: Negative.   Hematological: Negative.   Psychiatric/Behavioral: Negative.   All other systems reviewed and are negative.    Objective: Vital Signs: There were no vitals taken for this visit.  Physical Exam  Vitals signs and nursing note reviewed.  Constitutional:      Appearance: She is well-developed.  Pulmonary:     Effort: Pulmonary effort is normal.  Skin:    General: Skin is warm.     Capillary Refill: Capillary refill takes less than 2 seconds.  Neurological:     Mental Status: She is alert and oriented to person, place, and time.  Psychiatric:        Behavior: Behavior normal.        Thought Content: Thought content normal.        Judgment: Judgment normal.     Ortho Exam Left hip and thigh exam shows fully healed surgical scar.  What appears to be drainage is just excoriated and irritated skin.  There is no evidence of deep infection or abscess.  There is no fluctuance.  There is no cellulitis or redness or warmth. Specialty Comments:  No specialty comments available.  Imaging: No results found.   PMFS History: Patient Active Problem List   Diagnosis Date Noted  . Facial rash 07/27/2018  . Body mass index 40.0-44.9, adult ([facility]) 02/15/2018  . Fatigue associated with anemia 02/07/2018  . Unspecified open wound, left hip, initial encounter 01/19/2018  . Wound of left leg, sequela 10/03/2016  . 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  [place]) 08/24/2016  . Status post left hip replacement 06/01/2016  . Sinus tachycardia 05/19/2016  . History of adenomatous polyp of colon 03/21/2016  . Diabetes ([place] [place]) 01/21/2016  . Thyroid nodule 12/25/2014  . Infiltrate noted on imaging study   . Cervical spine arthritis 11/14/2014  . Primary osteoarthritis of left hip 11/14/2014  . Loss of consciousness ([place]) 11/14/2014  . Chest pain 03/26/2014  . Musculoskeletal chest and rib pain 03/26/2014  . Rib pain on right side 02/28/2014  . Groin pain 04/18/2013  . UNSPECIFIED RENAL SCLEROSIS 01/16/2009  . GERD, SEVERE 05/25/2007  . Morbid obesity ([place]) 04/03/2007  . HYPERLIPIDEMIA 08/31/2006  . Major depression, recurrent ([place]) 08/31/2006  . 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 Former  smoker 08/31/2006  . HYPERTENSION, BENIGN SYSTEMIC 08/31/2006  . GASTRIC ULCER ACUTE WITHOUT HEMORRHAGE 08/31/2006  . CONVULSIONS, SEIZURES, NOS 08/31/2006   Past Medical History:  Diagnosis Date  . Allergy   . Anemia   . Anxiety   . Arthritis   . Back pain, chronic   . Bronchitis   . Cough   . Depression   . GERD (gastroesophageal reflux disease)   . Hyperlipidemia   . Hypertension   . IBS (irritable bowel syndrome)   . Obesity   . Pneumonia 2016  . 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 Seizures ([place])    last >10 yrs ago- recorded 01/18/18  . Somatization disorder   . Tobacco abuse     Family History  Problem Relation Age of Onset  . Other Mother        failed surgery  . Heart Problems Father        poss MI, not sure:per pt  . Alzheimer's disease Maternal Grandmother   . Heart disease Maternal Grandfather   . Colon cancer Maternal Uncle        not sure age of onset    Past Surgical History:  Procedure Laterality Date  . ABDOMINAL HYSTERECTOMY    . ANTERIOR HIP REVISION Left 07/25/2016   Procedure: LEFT HIP IRRIGATION AND DEBRIDEMENT, REVISION OF HEAD AND LINER;  Surgeon: Leandrew Koyanagi, MD;  Location: [facility];  Service: Orthopedics;  Laterality: Left;  . CHOLECYSTECTOMY    . COLONOSCOPY    . HIP DEBRIDEMENT Left 01/19/2018   Procedure(s) Performed: IRRIGATION AND DEBRIDEMENT LEFT HIP (Left )  . INCISION AND DRAINAGE HIP Left 08/03/2016   Procedure: IRRIGATION AND DEBRIDEMENT LEFT HIP, VAC PLACEMENT;  Surgeon: Leandrew Koyanagi, MD;  Location: [facility];  Service: Orthopedics;  Laterality: Left;  . INCISION AND DRAINAGE HIP Left 01/19/2018   Procedure: IRRIGATION AND DEBRIDEMENT LEFT HIP;  Surgeon: Leandrew Koyanagi, MD;  Location: [facility];  Service: Orthopedics;  Laterality: Left;  . kidney stent Right 2002  . TONSILLECTOMY Bilateral 12/06/2014   Procedure: TONSILLECTOMY, BILATERAL;  Surgeon: Ruby Cola, MD;  Location: [facility];  Service: ENT;  Laterality: Bilateral;  .  TONSILLECTOMY    . TOTAL HIP ARTHROPLASTY Left 06/01/2016   Procedure: LEFT TOTAL HIP ARTHROPLASTY ANTERIOR APPROACH;  Surgeon: Leandrew Koyanagi, MD;  Location: [facility];  Service: Orthopedics;  Laterality: Left;  . TUBAL LIGATION    . WOUND EXPLORATION Left 01/19/2018   Procedure: WOUND EXPLORATION;  Surgeon: Leandrew Koyanagi, MD;  Location: [facility];  Service: Orthopedics;  Laterality: Left;   Social History   Occupational History  . Not on file  Tobacco Use  . Smoking status: Former Smoker  Packs/day: 0.25    Years: 0.50    Pack years: 0.12    Types: Cigarettes    Last attempt to quit: 01/27/2014    Years since quitting: 4.6  . Smokeless tobacco: Never Used  Substance and Sexual Activity  . Alcohol use: No  . Drug use: No  . Sexual activity: Not on file

## 2018-10-02 NOTE — Telephone Encounter (Signed)
She will come in this pm.

## 2018-10-02 NOTE — Telephone Encounter (Signed)
What do you think??

## 2018-10-02 NOTE — Telephone Encounter (Signed)
She needs to come in

## 2018-12-14 ENCOUNTER — Telehealth: Payer: Self-pay | Admitting: Orthopaedic Surgery

## 2018-12-14 MED ORDER — SULFAMETHOXAZOLE-TRIMETHOPRIM 800-160 MG PO TABS: 1.0000 | ORAL_TABLET | Freq: Two times a day (BID) | ORAL | 0 refills | Status: DC

## 2018-12-14 MED ORDER — MUPIROCIN 2 % EX OINT: 1.0000 "application " | TOPICAL_OINTMENT | Freq: Two times a day (BID) | CUTANEOUS | 0 refills | Status: DC

## 2018-12-14 NOTE — Telephone Encounter (Signed)
Pt lmom that states she need Rx refill for Mupirocin & also needs an antibiotic. She states she had a cyst that popped on or around her incision site.

## 2018-12-14 NOTE — Telephone Encounter (Signed)
Please advise 

## 2019-01-15 ENCOUNTER — Telehealth: Payer: Self-pay | Admitting: *Deleted

## 2019-01-15 ENCOUNTER — Other Ambulatory Visit: Payer: Self-pay | Admitting: Family Medicine

## 2019-01-15 DIAGNOSIS — K0889 Other specified disorders of teeth and supporting structures: Secondary | ICD-10-CM

## 2019-01-15 NOTE — Progress Notes (Signed)
Ab

## 2019-01-15 NOTE — Telephone Encounter (Signed)
Called patient. Told her that it was poor care to prescribe abx. Patient could call back for virtual vs. Inperson visit. Will place referral to dentistry.

## 2019-01-15 NOTE — Telephone Encounter (Signed)
Pt lm on VM stating that she is having a toothache and no dentist are open.  She is requesting a script for abx.    Called back, pt was not at home.  Left message to have pt return call. Christen Bame, CMA

## 2019-01-16 ENCOUNTER — Telehealth: Payer: Self-pay

## 2019-01-16 ENCOUNTER — Other Ambulatory Visit: Payer: Self-pay

## 2019-01-16 ENCOUNTER — Telehealth (INDEPENDENT_AMBULATORY_CARE_PROVIDER_SITE_OTHER): Payer: Medicaid Other | Admitting: Family Medicine

## 2019-01-16 DIAGNOSIS — K029 Dental caries, unspecified: Secondary | ICD-10-CM

## 2019-01-16 MED ORDER — AMOXICILLIN-POT CLAVULANATE 875-125 MG PO TABS: 1.0000 | ORAL_TABLET | Freq: Two times a day (BID) | ORAL | 0 refills | Status: DC

## 2019-01-16 NOTE — Progress Notes (Signed)
[facility] Telemedicine Visit  Patient consented to have virtual visit. Method of visit: Telephone  Encounter participants: Patient: Kristin [name] - located at home Provider: Bonnita Hollow - located at office Others (if applicable): Not applicable  Chief Complaint: Tooth pain  HPI:  Patient complaining of 5 days of worsening tooth pain on the right upper side of her mouth.  Says that the pain is also radiating to her right ear.  Says that she has no pain in her sinuses.  Denies fevers chills.  Tolerating p.o. well.  Did recently try to chew on that side the pain was significantly worse.  Pain is focal to one tooth.  Patient said that she is taking ibuprofen or Tylenol for pain.  This is been helping but it not helping as much as it has in the past.  Patient is curious to see it dentist and is also requesting antibiotics for the tooth pain as she is worried about infection.  Patient is tried to call the dentist multiple times he cannot get through.  Referral was placed for dentistry, a list of dentists was mailed to the patient when she can make the appointment.  ROS: per HPI  Pertinent PMHx: Noncontributory  Exam:  Respiratory: Able to speak in full sentences without issue  Assessment/Plan: ?  Dental carry +/-periodontal disease Based on discussion sounds like patient's focal dental carry that ultimately would be treated with extraction of the tooth.  This would be done by dentistry.  Patient is in the process of trying to make a dental appointment, get a list of dentist seen.  In the interim.  Recommend treatment with short course of antibiotics 5 days of Augmentin.  Continue to treat with Tylenol and ibuprofen alternating for pain.  Return precautions discussed with patient she to come in person if getting worse or not improving..   Time spent during visit with patient: 8 minutes

## 2019-01-16 NOTE — Telephone Encounter (Signed)
Wt- 250lb bp- n/a t- no fever  [place] ([place] - [address]  Pt gave consent to Telephone visit

## 2019-01-16 NOTE — Addendum Note (Signed)
Addended by: Josephine Igo B on: 01/16/2019 04:55 PM   Modules accepted: Orders

## 2019-02-18 ENCOUNTER — Other Ambulatory Visit: Payer: Self-pay

## 2019-02-18 DIAGNOSIS — Z20822 Contact with and (suspected) exposure to covid-19: Secondary | ICD-10-CM

## 2019-02-20 ENCOUNTER — Other Ambulatory Visit: Payer: Self-pay

## 2019-02-20 ENCOUNTER — Telehealth: Payer: Self-pay

## 2019-02-20 ENCOUNTER — Other Ambulatory Visit: Payer: Self-pay | Admitting: Family Medicine

## 2019-02-20 LAB — NOVEL CORONAVIRUS, NAA: SARS-CoV-2, NAA: NOT DETECTED

## 2019-02-20 MED ORDER — AMOXICILLIN 500 MG PO TABS: ORAL_TABLET | ORAL | 0 refills | Status: DC

## 2019-02-20 NOTE — Telephone Encounter (Signed)
Patient called stating that she has a dentist appointment on Wednesday, 02/27/2019.  Patient had left hip surgery on 01/19/2018.  Would like antibiotics sent to [facility] on [address].  Cb# is [phone].  Please advise.  Thank you.

## 2019-02-20 NOTE — Telephone Encounter (Signed)
Called and left a VM advising patient that Rx for Amoxicillin was sent to [facility] on [address].

## 2019-02-20 NOTE — Telephone Encounter (Signed)
Yes 2 g amoxicillin 30 mins before procedure

## 2019-03-12 ENCOUNTER — Telehealth: Payer: Self-pay | Admitting: Orthopaedic Surgery

## 2019-03-12 ENCOUNTER — Ambulatory Visit: Payer: No Typology Code available for payment source

## 2019-03-12 MED ORDER — MUPIROCIN 2 % EX OINT: 1.0000 "application " | TOPICAL_OINTMENT | Freq: Two times a day (BID) | CUTANEOUS | 0 refills | Status: DC

## 2019-03-12 NOTE — Telephone Encounter (Signed)
Ok to refill 

## 2019-03-12 NOTE — Telephone Encounter (Signed)
Please advise 

## 2019-03-12 NOTE — Telephone Encounter (Signed)
Pt called in requesting a refill on mupirocin ointment and please have that sent to [facility].   [phone]

## 2019-03-12 NOTE — Telephone Encounter (Signed)
Submitted to pharmacy 

## 2019-03-15 ENCOUNTER — Other Ambulatory Visit: Payer: Self-pay

## 2019-03-15 ENCOUNTER — Ambulatory Visit (INDEPENDENT_AMBULATORY_CARE_PROVIDER_SITE_OTHER): Payer: Medicaid Other

## 2019-03-15 DIAGNOSIS — Z23 Encounter for immunization: Secondary | ICD-10-CM

## 2019-03-26 ENCOUNTER — Other Ambulatory Visit: Payer: Self-pay | Admitting: Family Medicine

## 2019-04-03 ENCOUNTER — Other Ambulatory Visit: Payer: Self-pay

## 2019-04-03 ENCOUNTER — Telehealth: Payer: Self-pay | Admitting: Orthopaedic Surgery

## 2019-04-03 MED ORDER — AMOXICILLIN 500 MG PO TABS: ORAL_TABLET | ORAL | 0 refills | Status: DC

## 2019-04-03 NOTE — Telephone Encounter (Signed)
SENT INTO PHARM.

## 2019-04-03 NOTE — Telephone Encounter (Signed)
Patient called. She is having a dental procedure tomorrow. Says she needs meds called in for her to take before having the procedure. Her call back number is [phone]

## 2019-04-18 ENCOUNTER — Telehealth: Payer: Self-pay | Admitting: Orthopaedic Surgery

## 2019-04-18 NOTE — Telephone Encounter (Signed)
See message. Does patient need ABX?

## 2019-04-18 NOTE — Telephone Encounter (Signed)
Patient called. Says she is having a dental procedure done. Need medication. [phone] is her number.

## 2019-04-18 NOTE — Telephone Encounter (Signed)
2 g amoxicillin

## 2019-04-19 ENCOUNTER — Encounter: Payer: Self-pay | Admitting: Orthopaedic Surgery

## 2019-04-19 ENCOUNTER — Other Ambulatory Visit: Payer: Self-pay

## 2019-04-19 ENCOUNTER — Ambulatory Visit (INDEPENDENT_AMBULATORY_CARE_PROVIDER_SITE_OTHER): Payer: Medicaid Other | Admitting: Orthopaedic Surgery

## 2019-04-19 VITALS — Ht 62.0 in | Wt 258.0 lb

## 2019-04-19 DIAGNOSIS — M1612 Unilateral primary osteoarthritis, left hip: Secondary | ICD-10-CM

## 2019-04-19 NOTE — Telephone Encounter (Signed)
Dr Erlinda Hong discussed with patient today

## 2019-04-19 NOTE — Progress Notes (Signed)
Office Visit Note   Patient: Kristin [name]           Date of Birth: [birth year]           MRN: [id number] Visit Date: 04/19/2019              Requested by: Bonnita Hollow, MD [address],  [place] [address] PCP: Bonnita Hollow, MD   Assessment & Plan: Visit Diagnoses:  1. Primary osteoarthritis of left hip     Plan: At this point this boil has recurred many times.  She has undergone multiple surgeries in this area.  I would like to send her to plastic surgery for evaluation of scar revision if possible.  Questions encouraged and answered.  Follow-Up Instructions: Return if symptoms worsen or fail to improve.   Orders:  Orders Placed This Encounter  Procedures  . Ambulatory referral to Plastic Surgery   No orders of the defined types were placed in this encounter.     Procedures: No procedures performed   Clinical Data: No additional findings.   Subjective: Chief Complaint  Patient presents with  . Left Hip - Pain    Rosaura is coming in today for evaluation of a boil on her left thigh surgical scar.  Denies any pain or constitutional symptoms.   Review of Systems   Objective: Vital Signs: Ht 5\' 2"  (1.575 m)   Wt 258 lb (117 kg)   BMI 47.19 kg/m   Physical Exam  Ortho Exam Left thigh shows a healed surgical scar that is adherent to the subcutaneous tissue.  There is no mobility.  There is a small area of hyper granulation tissue.  No drainage.  No evidence of infection. Specialty Comments:  No specialty comments available.  Imaging: No results found.   PMFS History: Patient Active Problem List   Diagnosis Date Noted  . Facial rash 07/27/2018  . Body mass index 40.0-44.9, adult ([place]) 02/15/2018  . Fatigue associated with anemia 02/07/2018  . Unspecified open wound, left hip, initial encounter 01/19/2018  . Wound of left leg, sequela 10/03/2016  . 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 Status post  left hip replacement 06/01/2016  . Sinus tachycardia 05/19/2016  . History of adenomatous polyp of colon 03/21/2016  . Diabetes ([place]) 01/21/2016  . Thyroid nodule 12/25/2014  . Infiltrate noted on imaging study   . Cervical spine arthritis 11/14/2014  . Primary osteoarthritis of left hip 11/14/2014  . Loss of consciousness ([place]) 11/14/2014  . Chest pain 03/26/2014  . Musculoskeletal chest and rib pain 03/26/2014  . Rib pain on right side 02/28/2014  . Groin pain 04/18/2013  . UNSPECIFIED RENAL SCLEROSIS 01/16/2009  . GERD, SEVERE 05/25/2007  . Morbid obesity ([place]) 04/03/2007  . HYPERLIPIDEMIA 08/31/2006  . Major depression, recurrent ([place]) 08/31/2006  . 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 Former smoker 08/31/2006  . HYPERTENSION, BENIGN SYSTEMIC 08/31/2006  . GASTRIC ULCER ACUTE WITHOUT HEMORRHAGE 08/31/2006  . CONVULSIONS, SEIZURES, NOS 08/31/2006   Past Medical History:  Diagnosis Date  . Allergy   . Anemia   . Anxiety   . Arthritis   . Back pain, chronic   . Bronchitis   . Cough   . Depression   . GERD (gastroesophageal reflux disease)   . Hyperlipidemia   . Hypertension   . IBS (irritable bowel syndrome)   . Obesity   . Pneumonia 2016  . Renal sclerosis, unspecified    only has  1 kidney  . Seizures ([place])    last >10 yrs ago- recorded 01/18/18  . Somatization disorder   . Tobacco abuse     Family History  Problem Relation Age of Onset  . Other Mother        failed surgery  . Heart Problems Father        poss MI, not sure:per pt  . Alzheimer's disease Maternal Grandmother   . Heart disease Maternal Grandfather   . Colon cancer Maternal Uncle        not sure age of onset    Past Surgical History:  Procedure Laterality Date  . ABDOMINAL HYSTERECTOMY    . ANTERIOR HIP REVISION Left 07/25/2016   Procedure: LEFT HIP IRRIGATION AND DEBRIDEMENT, REVISION OF HEAD AND LINER;  Surgeon: Leandrew Koyanagi, MD;  Location: [facility];  Service: Orthopedics;  Laterality: Left;   . CHOLECYSTECTOMY    . COLONOSCOPY    . HIP DEBRIDEMENT Left 01/19/2018   Procedure(s) Performed: IRRIGATION AND DEBRIDEMENT LEFT HIP (Left )  . INCISION AND DRAINAGE HIP Left 08/03/2016   Procedure: IRRIGATION AND DEBRIDEMENT LEFT HIP, VAC PLACEMENT;  Surgeon: Leandrew Koyanagi, MD;  Location: [facility];  Service: Orthopedics;  Laterality: Left;  . INCISION AND DRAINAGE HIP Left 01/19/2018   Procedure: IRRIGATION AND DEBRIDEMENT LEFT HIP;  Surgeon: Leandrew Koyanagi, MD;  Location: [facility];  Service: Orthopedics;  Laterality: Left;  . kidney stent Right 2002  . TONSILLECTOMY Bilateral 12/06/2014   Procedure: TONSILLECTOMY, BILATERAL;  Surgeon: Ruby Cola, MD;  Location: [facility];  Service: ENT;  Laterality: Bilateral;  . TONSILLECTOMY    . TOTAL HIP ARTHROPLASTY Left 06/01/2016   Procedure: LEFT TOTAL HIP ARTHROPLASTY ANTERIOR APPROACH;  Surgeon: Leandrew Koyanagi, MD;  Location: [facility];  Service: Orthopedics;  Laterality: Left;  . TUBAL LIGATION    . WOUND EXPLORATION Left 01/19/2018   Procedure: WOUND EXPLORATION;  Surgeon: Leandrew Koyanagi, MD;  Location: [facility];  Service: Orthopedics;  Laterality: Left;   Social History   Occupational History  . Not on file  Tobacco Use  . Smoking status: Former Smoker    Packs/day: 0.25    Years: 0.50    Pack years: 0.12    Types: Cigarettes    Quit date: 01/27/2014    Years since quitting: 5.2  . Smokeless tobacco: Never Used  Substance and Sexual Activity  . Alcohol use: No  . Drug use: No  . Sexual activity: Not on file

## 2019-04-23 ENCOUNTER — Other Ambulatory Visit: Payer: Self-pay | Admitting: Family Medicine

## 2019-04-23 ENCOUNTER — Other Ambulatory Visit: Payer: Self-pay | Admitting: Physician Assistant

## 2019-04-23 ENCOUNTER — Telehealth: Payer: Self-pay | Admitting: Orthopaedic Surgery

## 2019-04-23 MED ORDER — AMOXICILLIN 500 MG PO TABS: ORAL_TABLET | ORAL | 0 refills | Status: DC

## 2019-04-23 NOTE — Telephone Encounter (Signed)
Sent in

## 2019-04-23 NOTE — Telephone Encounter (Signed)
Patient advised.

## 2019-04-23 NOTE — Telephone Encounter (Signed)
Please advise 

## 2019-04-23 NOTE — Telephone Encounter (Signed)
Patient is having dental work on Monday. Going out of town tomorrow. Would like amoxicillin called in. Her call back number is [phone]

## 2019-05-08 ENCOUNTER — Other Ambulatory Visit: Payer: Self-pay

## 2019-05-08 DIAGNOSIS — Z20822 Contact with and (suspected) exposure to covid-19: Secondary | ICD-10-CM

## 2019-05-09 LAB — NOVEL CORONAVIRUS, NAA: SARS-CoV-2, NAA: NOT DETECTED

## 2019-05-24 ENCOUNTER — Other Ambulatory Visit: Payer: Self-pay

## 2019-05-25 MED ORDER — METFORMIN HCL 500 MG PO TABS: ORAL_TABLET | ORAL | 0 refills | Status: DC

## 2019-06-04 ENCOUNTER — Institutional Professional Consult (permissible substitution): Payer: No Typology Code available for payment source | Admitting: Plastic Surgery

## 2019-06-11 ENCOUNTER — Encounter: Payer: Self-pay | Admitting: Plastic Surgery

## 2019-06-11 ENCOUNTER — Ambulatory Visit (INDEPENDENT_AMBULATORY_CARE_PROVIDER_SITE_OTHER): Payer: Medicaid Other | Admitting: Plastic Surgery

## 2019-06-11 ENCOUNTER — Other Ambulatory Visit: Payer: Self-pay

## 2019-06-11 VITALS — BP 150/91 | HR 95 | Temp 96.8°F | Ht 62.0 in | Wt 251.0 lb

## 2019-06-11 DIAGNOSIS — S81802S Unspecified open wound, left lower leg, sequela: Secondary | ICD-10-CM | POA: Diagnosis not present

## 2019-06-11 NOTE — Progress Notes (Signed)
Patient ID: Kristin [name], female    DOB: [birth year], 63 y.o.   MRN: [id number]   Chief Complaint  Patient presents with  . Skin Problem    The patient is a 63 year old black female here for evaluation of her left hip.  She had a left hip replacement in 2017 by Dr. Tomie China.  She had a great difficulty with healing and ended up with several revisions to the soft tissue.  She has multiple medical conditions including arthritis, anxiety, depression, seizure disorder, tobacco abuse and irritable bowel disease.  She is 5 fe she is 5 feet 2 inches tall and weighset 2 inches tall and weighs 251 pounds.  The wound is in the previous hip scar.  It is 2 x 2 cm in size and full thickness.  It appears to have granulation tissue.  She states it drains a great deal with movement.  It does not appear to be infected.     Review of Systems  Constitutional: Negative for activity change and appetite change.  Eyes: Negative.   Respiratory: Negative.  Negative for chest tightness and shortness of breath.   Cardiovascular: Negative.   Gastrointestinal: Negative.  Negative for abdominal pain.  Endocrine: Negative.   Genitourinary: Negative.   Musculoskeletal: Positive for back pain.  Skin: Positive for color change and wound.  Psychiatric/Behavioral: Negative.     Past Medical History:  Diagnosis Date  . Allergy   . Anemia   . Anxiety   . Arthritis   . Back pain, chronic   . Bronchitis   . Cough   . Depression   . GERD (gastroesophageal reflux disease)   . Hyperlipidemia   . Hypertension   . IBS (irritable bowel syndrome)   . Obesity   . Pneumonia 2016  . 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 Seizures ([place])    last >10 yrs ago- recorded 01/18/18  . Somatization disorder   . Tobacco abuse     Past Surgical History:  Procedure Laterality Date  . ABDOMINAL HYSTERECTOMY    . ANTERIOR HIP REVISION Left 07/25/2016   Procedure: LEFT HIP IRRIGATION AND DEBRIDEMENT, REVISION OF  HEAD AND LINER;  Surgeon: Leandrew Koyanagi, MD;  Location: [facility];  Service: Orthopedics;  Laterality: Left;  . CHOLECYSTECTOMY    . COLONOSCOPY    . HIP DEBRIDEMENT Left 01/19/2018   Procedure(s) Performed: IRRIGATION AND DEBRIDEMENT LEFT HIP (Left )  . INCISION AND DRAINAGE HIP Left 08/03/2016   Procedure: IRRIGATION AND DEBRIDEMENT LEFT HIP, VAC PLACEMENT;  Surgeon: Leandrew Koyanagi, MD;  Location: [facility];  Service: Orthopedics;  Laterality: Left;  . INCISION AND DRAINAGE HIP Left 01/19/2018   Procedure: IRRIGATION AND DEBRIDEMENT LEFT HIP;  Surgeon: Leandrew Koyanagi, MD;  Location: [facility];  Service: Orthopedics;  Laterality: Left;  . kidney stent Right 2002  . TONSILLECTOMY Bilateral 12/06/2014   Procedure: TONSILLECTOMY, BILATERAL;  Surgeon: Ruby Cola, MD;  Location: [facility];  Service: ENT;  Laterality: Bilateral;  . TONSILLECTOMY    . TOTAL HIP ARTHROPLASTY Left 06/01/2016   Procedure: LEFT TOTAL HIP ARTHROPLASTY ANTERIOR APPROACH;  Surgeon: Leandrew Koyanagi, MD;  Location: [facility];  Service: Orthopedics;  Laterality: Left;  . TUBAL LIGATION    . WOUND EXPLORATION Left 01/19/2018   Procedure: WOUND EXPLORATION;  Surgeon: Leandrew Koyanagi, MD;  Location: [facility];  Service: Orthopedics;  Laterality: Left;      Current Outpatient Medications:  .  amitriptyline (ELAVIL) 75 MG tablet, Take 150 mg by mouth at bedtime. , Disp: , Rfl:  .  Ascorbic Acid (VITAMIN C PO), Take 1 each by mouth daily., Disp: , Rfl:  .  cetirizine (ZYRTEC ALLERGY) 10 MG tablet, Take 1 tablet (10 mg total) by mouth daily., Disp: 30 tablet, Rfl: 0 .  cyclobenzaprine (FLEXERIL) 5 MG tablet, Take 1 tablet (5 mg total) by mouth at bedtime., Disp: 10 tablet, Rfl: 0 .  ferrous sulfate 325 (65 FE) MG tablet, Take 1 tablet (325 mg total) by mouth 2 (two) times daily., Disp: 60 tablet, Rfl: 3 .  hydrochlorothiazide (HYDRODIURIL) 25 MG tablet, Take 1 tablet (25 mg total) by mouth daily., Disp: 90 tablet, Rfl: 3 .  metFORMIN (GLUCOPHAGE) 500 MG tablet,  TAKE ONE TABLET BY MOUTH ONCE DAILY WITH BREAKFAST, AFTER 2 WEEKS, TAKE AN EXTRA TABLET EACH EVENING TO GET TO 2 TABLETS DAILY., Disp: 60 tablet, Rfl: 0 .  metoprolol tartrate (LOPRESSOR) 25 MG tablet, TAKE 1 TABLET BY MOUTH TWICE DAILY, Disp: 180 tablet, Rfl: 3 .  mupirocin ointment (BACTROBAN) 2 %, Apply 1 application topically 2 (two) times daily., Disp: 22 g, Rfl: 0 .  mupirocin ointment (BACTROBAN) 2 %, Apply 1 application topically 2 (two) times daily., Disp: 22 g, Rfl: 0 .  omeprazole (PRILOSEC) 20 MG capsule, Take 1 capsule (20 mg total) by mouth daily., Disp: 30 capsule, Rfl: 0 .  simvastatin (ZOCOR) 20 MG tablet, Take 1 tablet (20 mg total) by mouth at bedtime., Disp: 90 tablet, Rfl: 3 .  sulfamethoxazole-trimethoprim (BACTRIM DS) 800-160 MG tablet, Take 1 tablet by mouth 2 (two) times daily., Disp: 20 tablet, Rfl: 0 .  traZODone (DESYREL) 50 MG tablet, Take 25 mg by mouth at bedtime., Disp: , Rfl:  .  Triamcinolone Acetonide (TRIAMCINOLONE 0.1 % CREAM : EUCERIN) CREA, Apply 1 application topically 2 (two) times daily., Disp: 1 each, Rfl: 0 .  zinc gluconate 50 MG tablet, Take 50 mg by mouth daily., Disp: , Rfl:    Objective:   Vitals:   06/11/19 1128  BP: (!) 150/91  Pulse: 95  Temp: (!) 96.8 F (36 C)  SpO2: 96%    Physical Exam Vitals signs and nursing note reviewed.  Constitutional:      Appearance: Normal appearance.  HENT:     Head: Normocephalic and atraumatic.  Cardiovascular:     Rate and Rhythm: Normal rate.  Pulmonary:     Effort: Pulmonary effort is normal.  Abdominal:     General: Abdomen is flat.  Musculoskeletal:       Legs:  Skin:    Capillary Refill: Capillary refill takes less than 2 seconds.  Neurological:     General: No focal deficit present.     Mental Status: She is alert and oriented to person, place, and time.  Psychiatric:        Mood and Affect: Mood normal.        Behavior: Behavior normal.        Thought Content: Thought content  normal.     Assessment & Plan:  Wound of left leg, sequela  I spoke with Dr. Tomie China about the above information.  He does not think she has a retained nonabsorbable suture.   Silver nitrate was applied. Recommend excision of the wound and primary closure.  Would like to use ACell or celerrate if possible. Pictures were obtained of the patient and placed in the chart with the patient's or guardian's permission. She needs  to stop all tobacco use and be sure she is doing a multivitamin daily.   [place], DO

## 2019-06-14 ENCOUNTER — Other Ambulatory Visit: Payer: Self-pay | Admitting: *Deleted

## 2019-06-15 MED ORDER — TRAZODONE HCL 50 MG PO TABS: 25.0000 mg | ORAL_TABLET | Freq: Every day | ORAL | 3 refills | Status: DC

## 2019-07-07 NOTE — Progress Notes (Signed)
   Subjective:    Patient ID: Kristin [name], female    DOB: [birth year], 64 y.o.   MRN: [id number]   CC: hip wound and itching  HPI: Hip wound Recently seen by plastic surgery on 12/8 after recent hip replacement in 2017 by Dr. Tomie China. At that visit silver nitrate was applied. Patient was recommended for excision of wound and primary closure.  That this boil comes and goes.  States that it has been present since 2017.  Most recent occurred in October and drained some.  States she itches around the area.  Also reports some itching under the folds of her abdomen as well as in the folds of her thighs.  Tried over-the-counter Monistat and it stopped for a little bit but started back up 2 days later.  Reports some bumps under the folds of her skin.  No household members have similar rash.  No new products use.  No exposures to insects or other environmental factors.  No fevers or chills.    Objective:  BP 118/78   Pulse (!) 110   Wt 254 lb 12.8 oz (115.6 kg)   SpO2 98%   BMI 46.60 kg/m  Vitals and nursing note reviewed  General: well nourished, in no acute distress HEENT: normocephalic  Neck: supple  Respiratory: speaking full sentences, no increased work of breathing Abdomen: soft, nontender, nondistended, no masses or organomegaly. Bowel sounds present Extremities: no edema or cyanosis. Warm, well perfused.   Skin: warm and dry,   Left hip Area under folds of abdomen as well as inguinal folds showing moist erythematous rash  Neuro: alert and oriented, no focal deficits   Assessment & Plan:    Unspecified open wound, left hip, initial encounter Patient with left hip blister.  Has been evaluated by plastic surgery. Per note from Dr. Marla Roe on 12/8 recommended excision of wound with primary closure.  Patient was not aware of this and would really like this.  Patient will plan to call plastic surgeon today to help schedule this. Area does not appear to be acutely infected and no  systemic symptoms.  [facility] as needed.  Intertrigo Area underneath abdomen fold as well as inguinal folds appear to be infected with yeast.  Intertrigo is leading differential.  We will plan to treat with nystatin powder 3 times daily.  Advised that after yeast clears up she should try and keep these areas dry as moisture will help proliferate yeast.  Strict return precautions given.  Follow-up in 1 month if no improvement.    Return in about 1 month (around 08/09/2019), or if symptoms worsen or fail to improve.   Caroline More, DO, PGY-3

## 2019-07-09 ENCOUNTER — Ambulatory Visit (INDEPENDENT_AMBULATORY_CARE_PROVIDER_SITE_OTHER): Payer: Medicaid Other | Admitting: Family Medicine

## 2019-07-09 ENCOUNTER — Encounter: Payer: Self-pay | Admitting: Family Medicine

## 2019-07-09 ENCOUNTER — Other Ambulatory Visit: Payer: Self-pay

## 2019-07-09 DIAGNOSIS — L304 Erythema intertrigo: Secondary | ICD-10-CM | POA: Insufficient documentation

## 2019-07-09 DIAGNOSIS — S71002A Unspecified open wound, left hip, initial encounter: Secondary | ICD-10-CM | POA: Diagnosis not present

## 2019-07-09 MED ORDER — NYSTATIN 100000 UNIT/GM EX POWD: 1.0000 "application " | Freq: Three times a day (TID) | CUTANEOUS | 1 refills | Status: DC

## 2019-07-09 NOTE — Assessment & Plan Note (Signed)
Patient with left hip blister.  Has been evaluated by plastic surgery. Per note from Dr. Marla Roe on 12/8 recommended excision of wound with primary closure.  Patient was not aware of this and would really like this.  Patient will plan to call plastic surgeon today to help schedule this. Area does not appear to be acutely infected and no systemic symptoms.  Follow-up with [facility] as needed.

## 2019-07-09 NOTE — Patient Instructions (Signed)
Intertrigo Intertrigo is skin irritation (inflammation) that happens in warm, moist areas of the body. The irritation can cause a rash and make skin raw and itchy. The rash is usually pink or red. It happens mostly between folds of skin or where skin rubs together, such as:  Between the toes.  In the armpits.  In the groin area.  Under the belly.  Under the breasts.  Around the butt area. This condition is not passed from person to person (is not contagious). What are the causes?  Heat, moisture, rubbing, and not enough air movement.  The condition can be made worse by: ? Sweat. ? Bacteria. ? A fungus, such as yeast. What increases the risk?  Moisture in your skin folds.  You are more likely to develop this condition if you: ? Have diabetes. ? Are overweight. ? Are not able to move around. ? Live in a warm and moist climate. ? Wear splints, braces, or other medical devices. ? Are not able to control your pee (urine) or poop (stool). What are the signs or symptoms?  A pink or red skin rash in the skin fold or near the skin fold.  Raw or scaly skin.  Itching.  A burning feeling.  Bleeding.  Leaking fluid.  A bad smell. How is this treated?  Cleaning and drying your skin.  Taking an antibiotic medicine or using an antibiotic skin cream for a bacterial infection.  Using an antifungal cream on your skin or taking pills for an infection that was caused by a fungus, such as yeast.  Using a steroid ointment to stop the itching and irritation.  Separating the skin fold with a clean cotton cloth to absorb moisture and allow air to flow into the area. Follow these instructions at home:  Keep the affected area clean and dry.  Do not scratch your skin.  Stay cool as much as you can. Use an air conditioner or a fan, if you have one.  Apply over-the-counter and prescription medicines only as told by your doctor.  If you were prescribed an antibiotic medicine,  use it as told by your doctor. Do not stop using the antibiotic even if your condition starts to get better.  Keep all follow-up visits as told by your doctor. This is important. How is this prevented?   Stay at a healthy weight.  Take care of your feet. This is very important if you have diabetes. You should: ? Wear shoes that fit well. ? Keep your feet dry. ? Wear clean cotton or wool socks.  Protect the skin in your groin and butt area as told by your doctor. To do this: ? Follow a regular cleaning routine. ? Use creams, powders, or ointments that protect your skin. ? Change protection pads often.  Do not wear tight clothes. Wear clothes that: ? Are loose. ? Take moisture away from your body. ? Are made of cotton.  Wear a bra that gives good support, if needed.  Shower and dry yourself well after being active. Use a hair dryer on a cool setting to dry between skin folds.  Keep your blood sugar under control if you have diabetes. Contact a doctor if:  Your symptoms do not get better with treatment.  Your symptoms get worse or they spread.  You notice more redness and warmth.  You have a fever. Summary  Intertrigo is skin irritation that occurs when folds of skin rub together.  This condition is caused by heat, moisture,   and rubbing.  This condition may be treated by cleaning and drying your skin and with medicines.  Apply over-the-counter and prescription medicines only as told by your doctor.  Keep all follow-up visits as told by your doctor. This is important. This information is not intended to replace advice given to you by your health care provider. Make sure you discuss any questions you have with your health care provider. Document Revised: 03/29/2018 Document Reviewed: 03/29/2018 Elsevier Patient Education  [employer].   Please make sure to keep the area dry as moisture will worsen the rash/itching.   For your boil, please follow up with  plastic surgery  Dr. Tammi Klippel

## 2019-07-09 NOTE — Assessment & Plan Note (Signed)
Area underneath abdomen fold as well as inguinal folds appear to be infected with yeast.  Intertrigo is leading differential.  We will plan to treat with nystatin powder 3 times daily.  Advised that after yeast clears up she should try and keep these areas dry as moisture will help proliferate yeast.  Strict return precautions given.  Follow-up in 1 month if no improvement.

## 2019-07-11 ENCOUNTER — Encounter (HOSPITAL_BASED_OUTPATIENT_CLINIC_OR_DEPARTMENT_OTHER)
Admission: RE | Admit: 2019-07-11 | Discharge: 2019-07-11 | Disposition: A | Discharge status: Home / Self Care | Payer: Medicaid Other | Source: Ambulatory Visit | Attending: Plastic Surgery | Admitting: Plastic Surgery

## 2019-07-11 ENCOUNTER — Encounter (HOSPITAL_BASED_OUTPATIENT_CLINIC_OR_DEPARTMENT_OTHER): Payer: Self-pay | Admitting: Plastic Surgery

## 2019-07-11 ENCOUNTER — Encounter: Payer: Self-pay | Admitting: Surgical

## 2019-07-11 ENCOUNTER — Ambulatory Visit (INDEPENDENT_AMBULATORY_CARE_PROVIDER_SITE_OTHER): Payer: Medicaid Other | Admitting: Surgical

## 2019-07-11 ENCOUNTER — Other Ambulatory Visit: Payer: Self-pay

## 2019-07-11 VITALS — BP 129/84 | HR 104 | Temp 97.3°F | Ht 62.0 in | Wt 254.4 lb

## 2019-07-11 DIAGNOSIS — S81802S Unspecified open wound, left lower leg, sequela: Secondary | ICD-10-CM

## 2019-07-11 DIAGNOSIS — Z01818 Encounter for other preprocedural examination: Secondary | ICD-10-CM | POA: Diagnosis not present

## 2019-07-11 DIAGNOSIS — F331 Major depressive disorder, recurrent, moderate: Secondary | ICD-10-CM | POA: Diagnosis not present

## 2019-07-11 LAB — BASIC METABOLIC PANEL
Anion gap: 11 (ref 5–15)
BUN: 15 mg/dL (ref 8–23)
CO2: 29 mmol/L (ref 22–32)
Calcium: 10.2 mg/dL (ref 8.9–10.3)
Chloride: 101 mmol/L (ref 98–111)
Creatinine, Ser: 0.98 mg/dL (ref 0.44–1.00)
GFR calc Af Amer: >60 mL/min (ref >60)
GFR calc non Af Amer: >60 mL/min (ref >60)
Glucose, Bld: 101 mg/dL — ABNORMAL HIGH (ref 70–99)
Potassium: 4.3 mmol/L (ref 3.5–5.1)
Sodium: 141 mmol/L (ref 135–145)

## 2019-07-11 MED ORDER — HYDROCODONE-ACETAMINOPHEN 5-325 MG PO TABS: 1.0000 | ORAL_TABLET | Freq: Four times a day (QID) | ORAL | 0 refills | Status: AC | PRN

## 2019-07-11 MED ORDER — CEPHALEXIN 500 MG PO CAPS: 500.0000 mg | ORAL_CAPSULE | Freq: Four times a day (QID) | ORAL | 0 refills | Status: AC

## 2019-07-11 MED ORDER — ONDANSETRON HCL 4 MG PO TABS: 4.0000 mg | ORAL_TABLET | Freq: Three times a day (TID) | ORAL | 0 refills | Status: DC | PRN

## 2019-07-11 NOTE — Progress Notes (Signed)
Patient ID: Kristin [name], female    DOB: 1955/12/01, 64 y.o.   MRN: [id number]  Chief Complaint  Patient presents with  . Pre-op Exam    Excision of (L) leg wound w/Acell placement      ICD-10-CM   1. Wound of left leg, sequela  S81.802S     History of Present Illness: Kristin [name] is a 64 y.o.  female  with a history of left hip replacement in 2017 and subsequent wound healing issues resulting in several revisions of the soft tissue.  She presents for preoperative evaluation for upcoming procedure, excision of left hip wound with placement of Acell and primary closure scheduled for 07/17/19 with Dr. Ulice Bold.  The patient has not had problems with anesthesia.  She reports tolerating past procedures fine.  She no longer smokes.  She has a hx of HTN, GERD, diabetes, HLD, obesity, MDD, Seizures (her last seizure was greater than 10 years ago, she is not currently taking medication for this.)  HTN well controlled per pt. Last a1c was 02/06/18 per EMR and was 6.9.  She has not had any labs since then.  She does not take any blood thinners.  No history of blood clots.  No family history of blood clots. No HRT. No recent colds No history of stroke/MI/COPD/asthma.  Today on exam her left hip wound is hyper granulated approximately 0.2 cm.  The overall size of the wound is 0.5 x 0.5 cm.  Some serosanguineous drainage noted.  Past Medical History: Allergies: Allergies  Allergen Reactions  . Fluconazole Swelling and Other (See Comments)    REACTION: Face swelling  . Robitussin (Alcohol Free) [Guaifenesin] Palpitations and Other (See Comments)    Chest pain    Current Medications:  Current Outpatient Medications:  .  amitriptyline (ELAVIL) 75 MG tablet, Take 150 mg by mouth at bedtime. , Disp: , Rfl:  .  Ascorbic Acid (VITAMIN C PO), Take 1 each by mouth daily., Disp: , Rfl:  .  cetirizine (ZYRTEC ALLERGY) 10 MG tablet, Take 1 tablet (10 mg total) by mouth daily., Disp: 30  tablet, Rfl: 0 .  cyclobenzaprine (FLEXERIL) 5 MG tablet, Take 1 tablet (5 mg total) by mouth at bedtime., Disp: 10 tablet, Rfl: 0 .  ferrous sulfate 325 (65 FE) MG tablet, Take 1 tablet (325 mg total) by mouth 2 (two) times daily., Disp: 60 tablet, Rfl: 3 .  hydrochlorothiazide (HYDRODIURIL) 25 MG tablet, Take 1 tablet (25 mg total) by mouth daily., Disp: 90 tablet, Rfl: 3 .  metFORMIN (GLUCOPHAGE) 500 MG tablet, TAKE ONE TABLET BY MOUTH ONCE DAILY WITH BREAKFAST, AFTER 2 WEEKS, TAKE AN EXTRA TABLET EACH EVENING TO GET TO 2 TABLETS DAILY., Disp: 60 tablet, Rfl: 0 .  metoprolol tartrate (LOPRESSOR) 25 MG tablet, TAKE 1 TABLET BY MOUTH TWICE DAILY, Disp: 180 tablet, Rfl: 3 .  nystatin (MYCOSTATIN/NYSTOP) powder, Apply 1 application topically 3 (three) times daily., Disp: 60 g, Rfl: 1 .  omeprazole (PRILOSEC) 20 MG capsule, Take 1 capsule (20 mg total) by mouth daily., Disp: 30 capsule, Rfl: 0 .  simvastatin (ZOCOR) 20 MG tablet, Take 1 tablet (20 mg total) by mouth at bedtime., Disp: 90 tablet, Rfl: 3 .  traZODone (DESYREL) 50 MG tablet, Take 0.5 tablets (25 mg total) by mouth at bedtime., Disp: 30 tablet, Rfl: 3 .  Triamcinolone Acetonide (TRIAMCINOLONE 0.1 % CREAM : EUCERIN) CREA, Apply 1 application topically 2 (two) times daily., Disp: 1 each, Rfl: 0 .  zinc gluconate 50 MG tablet, Take 50 mg by mouth daily., Disp: , Rfl:   Past Medical Problems: Past Medical History:  Diagnosis Date  . Allergy   . Anemia   . Anxiety   . Arthritis   . Back pain, chronic   . Bronchitis   . Cough   . Depression   . Diabetes mellitus without complication (HCC)   . GERD (gastroesophageal reflux disease)   . Hyperlipidemia   . Hypertension   . IBS (irritable bowel syndrome)   . Obesity   . Pneumonia 2016  . 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 Seizures (HCC)    last >10 yrs ago- recorded 01/18/18  . Somatization disorder   . Tobacco abuse     Past Surgical History: Past Surgical  History:  Procedure Laterality Date  . ABDOMINAL HYSTERECTOMY    . ANTERIOR HIP REVISION Left 07/25/2016   Procedure: LEFT HIP IRRIGATION AND DEBRIDEMENT, REVISION OF HEAD AND LINER;  Surgeon: Tarry Kos, MD;  Location: [facility] OR;  Service: Orthopedics;  Laterality: Left;  . CHOLECYSTECTOMY    . COLONOSCOPY    . HIP DEBRIDEMENT Left 01/19/2018   Procedure(s) Performed: IRRIGATION AND DEBRIDEMENT LEFT HIP (Left )  . INCISION AND DRAINAGE HIP Left 08/03/2016   Procedure: IRRIGATION AND DEBRIDEMENT LEFT HIP, VAC PLACEMENT;  Surgeon: Tarry Kos, MD;  Location: [facility] OR;  Service: Orthopedics;  Laterality: Left;  . INCISION AND DRAINAGE HIP Left 01/19/2018   Procedure: IRRIGATION AND DEBRIDEMENT LEFT HIP;  Surgeon: Tarry Kos, MD;  Location: [facility] OR;  Service: Orthopedics;  Laterality: Left;  . kidney stent Right 2002  . TONSILLECTOMY Bilateral 12/06/2014   Procedure: TONSILLECTOMY, BILATERAL;  Surgeon: Melvenia Beam, MD;  Location: [facility] OR;  Service: ENT;  Laterality: Bilateral;  . TONSILLECTOMY    . TOTAL HIP ARTHROPLASTY Left 06/01/2016   Procedure: LEFT TOTAL HIP ARTHROPLASTY ANTERIOR APPROACH;  Surgeon: Tarry Kos, MD;  Location: [facility] OR;  Service: Orthopedics;  Laterality: Left;  . TUBAL LIGATION    . WOUND EXPLORATION Left 01/19/2018   Procedure: WOUND EXPLORATION;  Surgeon: Tarry Kos, MD;  Location: [facility] OR;  Service: Orthopedics;  Laterality: Left;    Social History: Social History   Socioeconomic History  . Marital status: Married    Spouse name: Not on file  . Number of children: 3  . Years of education: Not on file  . Highest education level: 12th grade  Occupational History  . Not on file  Tobacco Use  . Smoking status: Former Smoker    Packs/day: 0.25    Years: 0.50    Pack years: 0.12    Types: Cigarettes    Quit date: 01/27/2014    Years since quitting: 5.4  . Smokeless tobacco: Never Used  Substance and Sexual Activity  . Alcohol use: No  . Drug use: No  . Sexual  activity: Not on file  Other Topics Concern  . Not on file  Social History Narrative  . Not on file   Social Determinants of Health   Financial Resource Strain:   . Difficulty of Paying Living Expenses: Not on file  Food Insecurity:   . Worried About Programme researcher, broadcasting/film/video in the Last Year: Not on file  . Ran Out of Food in the Last Year: Not on file  Transportation Needs: No Transportation Needs  . Lack of Transportation (Medical): No  . Lack of Transportation (Non-Medical): No  Physical Activity:   . Days of Exercise per Week: Not on file  . Minutes of Exercise per Session: Not on file  Stress:   . Feeling of Stress : Not on file  Social Connections:   . Frequency of Communication with Friends and Family: Not on file  . Frequency of Social Gatherings with Friends and Family: Not on file  . Attends Religious Services: Not on file  . Active Member of Clubs or Organizations: Not on file  . Attends Banker Meetings: Not on file  . Marital Status: Not on file  Intimate Partner Violence:   . Fear of Current or Ex-Partner: Not on file  . Emotionally Abused: Not on file  . Physically Abused: Not on file  . Sexually Abused: Not on file    Family History: Family History  Problem Relation Age of Onset  . Other Mother        failed surgery  . Heart Problems Father        poss MI, not sure:per pt  . Alzheimer's disease Maternal Grandmother   . Heart disease Maternal Grandfather   . Colon cancer Maternal Uncle        not sure age of onset    Review of Systems: Review of Systems  Constitutional: Negative for chills, fever and weight loss.  Respiratory: Negative.   Cardiovascular: Negative.   Gastrointestinal: Negative.   Genitourinary: Negative.   Musculoskeletal: Positive for neck pain.  Skin: Positive for itching and rash.  Neurological: Negative.     Physical Exam: Vital Signs BP 129/84 (BP Location: Left Arm, Patient Position: Sitting, Cuff Size: Large)    Pulse (!) 104   Temp (!) 97.3 F (36.3 C) (Temporal)   Ht 5\' 2"  (1.575 m)   Wt 254 lb 6.4 oz (115.4 kg)   SpO2 96%   BMI 46.53 kg/m  Chaperone present Physical Exam Constitutional:      General: She is not in acute distress.    Appearance: Normal appearance.  HENT:     Head: Normocephalic and atraumatic.  Cardiovascular:     Rate and Rhythm: Normal rate and regular rhythm.     Pulses: Normal pulses.     Heart sounds: Normal heart sounds. No murmur.  Pulmonary:     Effort: Pulmonary effort is normal. No respiratory distress.     Breath sounds: Normal breath sounds. No wheezing.  Abdominal:     General: Abdomen is flat. Bowel sounds are normal.     Palpations: Abdomen is soft.  Musculoskeletal:        General: No swelling or tenderness. Normal range of motion.     Cervical back: Normal range of motion.       Legs:  Skin:    General: Skin is warm and dry.  Neurological:     General: No focal deficit present.     Mental Status: She is alert and oriented to person, place, and time.  Psychiatric:        Mood and Affect: Mood normal.        Behavior: Behavior normal.    Assessment/Plan: The patient is scheduled for excision of L leg wound with Acell placement and primary closure with Dr. Ulice Bold.  Risks, benefits, and alternatives of procedure discussed, questions answered and consent obtained.    We verbally went over all risks associated with general surgery and this surgery in specific.  Rx sent to pharmacy. Initial pain control with APAP, followed by Norco PRN for  severe pain. She does not use ibuprofen much due to reflux issues. Do not exceed 4000mg  in 24hrs.  COVID test scheduled. Pre-admission testing scheduled.  Electronically signed by: Kermit Balo Burlie Cajamarca, PA-C 07/11/2019 2:32 PM

## 2019-07-11 NOTE — H&P (View-Only) (Signed)
Patient ID: Kristin [name], female    DOB: 1955/12/01, 64 y.o.   MRN: [id number]  Chief Complaint  Patient presents with  . Pre-op Exam    Excision of (L) leg wound w/Acell placement      ICD-10-CM   1. Wound of left leg, sequela  S81.802S     History of Present Illness: Kristin [name] is a 64 y.o.  female  with a history of left hip replacement in 2017 and subsequent wound healing issues resulting in several revisions of the soft tissue.  Kristin [name] presents for preoperative evaluation for upcoming procedure, excision of left hip wound with placement of Acell and primary closure scheduled for 07/17/19 with Dr. Ulice Bold.  The patient has not had problems with anesthesia.  Kristin [name] reports tolerating past procedures fine.  Kristin [name] no longer smokes.  Kristin [name] has a hx of HTN, GERD, diabetes, HLD, obesity, MDD, Seizures (her last seizure was greater than 10 years ago, Kristin [name] is not currently taking medication for this.)  HTN well controlled per pt. Last a1c was 02/06/18 per EMR and was 6.9.  Kristin [name] has not had any labs since then.  Kristin [name] does not take any blood thinners.  No history of blood clots.  No family history of blood clots. No HRT. No recent colds No history of stroke/MI/COPD/asthma.  Today on exam her left hip wound is hyper granulated approximately 0.2 cm.  The overall size of the wound is 0.5 x 0.5 cm.  Some serosanguineous drainage noted.  Past Medical History: Allergies: Allergies  Allergen Reactions  . Fluconazole Swelling and Other (See Comments)    REACTION: Face swelling  . Robitussin (Alcohol Free) [Guaifenesin] Palpitations and Other (See Comments)    Chest pain    Current Medications:  Current Outpatient Medications:  .  amitriptyline (ELAVIL) 75 MG tablet, Take 150 mg by mouth at bedtime. , Disp: , Rfl:  .  Ascorbic Acid (VITAMIN C PO), Take 1 each by mouth daily., Disp: , Rfl:  .  cetirizine (ZYRTEC ALLERGY) 10 MG tablet, Take 1 tablet (10 mg total) by mouth daily., Disp: 30  tablet, Rfl: 0 .  cyclobenzaprine (FLEXERIL) 5 MG tablet, Take 1 tablet (5 mg total) by mouth at bedtime., Disp: 10 tablet, Rfl: 0 .  ferrous sulfate 325 (65 FE) MG tablet, Take 1 tablet (325 mg total) by mouth 2 (two) times daily., Disp: 60 tablet, Rfl: 3 .  hydrochlorothiazide (HYDRODIURIL) 25 MG tablet, Take 1 tablet (25 mg total) by mouth daily., Disp: 90 tablet, Rfl: 3 .  metFORMIN (GLUCOPHAGE) 500 MG tablet, TAKE ONE TABLET BY MOUTH ONCE DAILY WITH BREAKFAST, AFTER 2 WEEKS, TAKE AN EXTRA TABLET EACH EVENING TO GET TO 2 TABLETS DAILY., Disp: 60 tablet, Rfl: 0 .  metoprolol tartrate (LOPRESSOR) 25 MG tablet, TAKE 1 TABLET BY MOUTH TWICE DAILY, Disp: 180 tablet, Rfl: 3 .  nystatin (MYCOSTATIN/NYSTOP) powder, Apply 1 application topically 3 (three) times daily., Disp: 60 g, Rfl: 1 .  omeprazole (PRILOSEC) 20 MG capsule, Take 1 capsule (20 mg total) by mouth daily., Disp: 30 capsule, Rfl: 0 .  simvastatin (ZOCOR) 20 MG tablet, Take 1 tablet (20 mg total) by mouth at bedtime., Disp: 90 tablet, Rfl: 3 .  traZODone (DESYREL) 50 MG tablet, Take 0.5 tablets (25 mg total) by mouth at bedtime., Disp: 30 tablet, Rfl: 3 .  Triamcinolone Acetonide (TRIAMCINOLONE 0.1 % CREAM : EUCERIN) CREA, Apply 1 application topically 2 (two) times daily., Disp: 1 each, Rfl: 0 .  zinc gluconate 50 MG tablet, Take 50 mg by mouth daily., Disp: , Rfl:   Past Medical Problems: Past Medical History:  Diagnosis Date  . Allergy   . Anemia   . Anxiety   . Arthritis   . Back pain, chronic   . Bronchitis   . Cough   . Depression   . Diabetes mellitus without complication (HCC)   . GERD (gastroesophageal reflux disease)   . Hyperlipidemia   . Hypertension   . IBS (irritable bowel syndrome)   . Obesity   . Pneumonia 2016  . 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 Seizures (HCC)    last >10 yrs ago- recorded 01/18/18  . Somatization disorder   . Tobacco abuse     Past Surgical History: Past Surgical  History:  Procedure Laterality Date  . ABDOMINAL HYSTERECTOMY    . ANTERIOR HIP REVISION Left 07/25/2016   Procedure: LEFT HIP IRRIGATION AND DEBRIDEMENT, REVISION OF HEAD AND LINER;  Surgeon: Tarry Kos, MD;  Location: [facility] OR;  Service: Orthopedics;  Laterality: Left;  . CHOLECYSTECTOMY    . COLONOSCOPY    . HIP DEBRIDEMENT Left 01/19/2018   Procedure(s) Performed: IRRIGATION AND DEBRIDEMENT LEFT HIP (Left )  . INCISION AND DRAINAGE HIP Left 08/03/2016   Procedure: IRRIGATION AND DEBRIDEMENT LEFT HIP, VAC PLACEMENT;  Surgeon: Tarry Kos, MD;  Location: [facility] OR;  Service: Orthopedics;  Laterality: Left;  . INCISION AND DRAINAGE HIP Left 01/19/2018   Procedure: IRRIGATION AND DEBRIDEMENT LEFT HIP;  Surgeon: Tarry Kos, MD;  Location: [facility] OR;  Service: Orthopedics;  Laterality: Left;  . kidney stent Right 2002  . TONSILLECTOMY Bilateral 12/06/2014   Procedure: TONSILLECTOMY, BILATERAL;  Surgeon: Melvenia Beam, MD;  Location: [facility] OR;  Service: ENT;  Laterality: Bilateral;  . TONSILLECTOMY    . TOTAL HIP ARTHROPLASTY Left 06/01/2016   Procedure: LEFT TOTAL HIP ARTHROPLASTY ANTERIOR APPROACH;  Surgeon: Tarry Kos, MD;  Location: [facility] OR;  Service: Orthopedics;  Laterality: Left;  . TUBAL LIGATION    . WOUND EXPLORATION Left 01/19/2018   Procedure: WOUND EXPLORATION;  Surgeon: Tarry Kos, MD;  Location: [facility] OR;  Service: Orthopedics;  Laterality: Left;    Social History: Social History   Socioeconomic History  . Marital status: Married    Spouse name: Not on file  . Number of children: 3  . Years of education: Not on file  . Highest education level: 12th grade  Occupational History  . Not on file  Tobacco Use  . Smoking status: Former Smoker    Packs/day: 0.25    Years: 0.50    Pack years: 0.12    Types: Cigarettes    Quit date: 01/27/2014    Years since quitting: 5.4  . Smokeless tobacco: Never Used  Substance and Sexual Activity  . Alcohol use: No  . Drug use: No  . Sexual  activity: Not on file  Other Topics Concern  . Not on file  Social History Narrative  . Not on file   Social Determinants of Health   Financial Resource Strain:   . Difficulty of Paying Living Expenses: Not on file  Food Insecurity:   . Worried About Programme researcher, broadcasting/film/video in the Last Year: Not on file  . Ran Out of Food in the Last Year: Not on file  Transportation Needs: No Transportation Needs  . Lack of Transportation (Medical): No  . Lack of Transportation (Non-Medical): No  Physical Activity:   . Days of Exercise per Week: Not on file  . Minutes of Exercise per Session: Not on file  Stress:   . Feeling of Stress : Not on file  Social Connections:   . Frequency of Communication with Friends and Family: Not on file  . Frequency of Social Gatherings with Friends and Family: Not on file  . Attends Religious Services: Not on file  . Active Member of Clubs or Organizations: Not on file  . Attends Banker Meetings: Not on file  . Marital Status: Not on file  Intimate Partner Violence:   . Fear of Current or Ex-Partner: Not on file  . Emotionally Abused: Not on file  . Physically Abused: Not on file  . Sexually Abused: Not on file    Family History: Family History  Problem Relation Age of Onset  . Other Mother        failed surgery  . Heart Problems Father        poss MI, not sure:per pt  . Alzheimer's disease Maternal Grandmother   . Heart disease Maternal Grandfather   . Colon cancer Maternal Uncle        not sure age of onset    Review of Systems: Review of Systems  Constitutional: Negative for chills, fever and weight loss.  Respiratory: Negative.   Cardiovascular: Negative.   Gastrointestinal: Negative.   Genitourinary: Negative.   Musculoskeletal: Positive for neck pain.  Skin: Positive for itching and rash.  Neurological: Negative.     Physical Exam: Vital Signs BP 129/84 (BP Location: Left Arm, Patient Position: Sitting, Cuff Size: Large)    Pulse (!) 104   Temp (!) 97.3 F (36.3 C) (Temporal)   Ht 5\' 2"  (1.575 m)   Wt 254 lb 6.4 oz (115.4 kg)   SpO2 96%   BMI 46.53 kg/m  Chaperone present Physical Exam Constitutional:      General: Kristin [name] is not in acute distress.    Appearance: Normal appearance.  HENT:     Head: Normocephalic and atraumatic.  Cardiovascular:     Rate and Rhythm: Normal rate and regular rhythm.     Pulses: Normal pulses.     Heart sounds: Normal heart sounds. No murmur.  Pulmonary:     Effort: Pulmonary effort is normal. No respiratory distress.     Breath sounds: Normal breath sounds. No wheezing.  Abdominal:     General: Abdomen is flat. Bowel sounds are normal.     Palpations: Abdomen is soft.  Musculoskeletal:        General: No swelling or tenderness. Normal range of motion.     Cervical back: Normal range of motion.       Legs:  Skin:    General: Skin is warm and dry.  Neurological:     General: No focal deficit present.     Mental Status: Kristin [name] is alert and oriented to person, place, and time.  Psychiatric:        Mood and Affect: Mood normal.        Behavior: Behavior normal.    Assessment/Plan: The patient is scheduled for excision of L leg wound with Acell placement and primary closure with Dr. Ulice Bold.  Risks, benefits, and alternatives of procedure discussed, questions answered and consent obtained.    We verbally went over all risks associated with general surgery and this surgery in specific.  Rx sent to pharmacy. Initial pain control with APAP, followed by Norco PRN for  severe pain. Kristin [name] does not use ibuprofen much due to reflux issues. Do not exceed 4000mg  in 24hrs.  COVID test scheduled. Pre-admission testing scheduled.  Electronically signed by: Kermit Balo Burlie Cajamarca, PA-C 07/11/2019 2:32 PM

## 2019-07-12 ENCOUNTER — Other Ambulatory Visit: Payer: Self-pay | Admitting: *Deleted

## 2019-07-13 MED ORDER — AMITRIPTYLINE HCL 75 MG PO TABS: 150.0000 mg | ORAL_TABLET | Freq: Every day | ORAL | 3 refills | Status: DC

## 2019-07-15 ENCOUNTER — Other Ambulatory Visit (HOSPITAL_COMMUNITY)
Admission: RE | Admit: 2019-07-15 | Discharge: 2019-07-15 | Disposition: A | Discharge status: Home / Self Care | Payer: Medicaid Other | Source: Ambulatory Visit | Attending: Plastic Surgery | Admitting: Plastic Surgery

## 2019-07-15 ENCOUNTER — Other Ambulatory Visit: Payer: Self-pay | Admitting: *Deleted

## 2019-07-15 DIAGNOSIS — Z01812 Encounter for preprocedural laboratory examination: Secondary | ICD-10-CM | POA: Insufficient documentation

## 2019-07-15 DIAGNOSIS — Z20822 Contact with and (suspected) exposure to covid-19: Secondary | ICD-10-CM | POA: Insufficient documentation

## 2019-07-15 LAB — SARS CORONAVIRUS 2 (TAT 6-24 HRS): SARS Coronavirus 2: NEGATIVE

## 2019-07-15 MED ORDER — METFORMIN HCL 500 MG PO TABS: ORAL_TABLET | ORAL | 0 refills | Status: DC

## 2019-07-17 ENCOUNTER — Ambulatory Visit (HOSPITAL_BASED_OUTPATIENT_CLINIC_OR_DEPARTMENT_OTHER)
Admission: RE | Admit: 2019-07-17 | Discharge: 2019-07-17 | Disposition: A | Discharge status: Home / Self Care | Payer: Medicaid Other | Attending: Plastic Surgery | Admitting: Plastic Surgery

## 2019-07-17 ENCOUNTER — Encounter (HOSPITAL_BASED_OUTPATIENT_CLINIC_OR_DEPARTMENT_OTHER)
Admission: RE | Disposition: A | Discharge status: Home / Self Care | Payer: Self-pay | Source: Home / Self Care | Attending: Plastic Surgery

## 2019-07-17 ENCOUNTER — Ambulatory Visit (HOSPITAL_BASED_OUTPATIENT_CLINIC_OR_DEPARTMENT_OTHER): Payer: Medicaid Other | Admitting: Certified Registered"

## 2019-07-17 ENCOUNTER — Other Ambulatory Visit: Payer: Self-pay

## 2019-07-17 ENCOUNTER — Encounter (HOSPITAL_BASED_OUTPATIENT_CLINIC_OR_DEPARTMENT_OTHER): Payer: Self-pay | Admitting: Plastic Surgery

## 2019-07-17 DIAGNOSIS — F329 Major depressive disorder, single episode, unspecified: Secondary | ICD-10-CM | POA: Insufficient documentation

## 2019-07-17 DIAGNOSIS — Z7984 Long term (current) use of oral hypoglycemic drugs: Secondary | ICD-10-CM | POA: Diagnosis not present

## 2019-07-17 DIAGNOSIS — E785 Hyperlipidemia, unspecified: Secondary | ICD-10-CM | POA: Insufficient documentation

## 2019-07-17 DIAGNOSIS — Z87891 Personal history of nicotine dependence: Secondary | ICD-10-CM | POA: Insufficient documentation

## 2019-07-17 DIAGNOSIS — Z6841 Body Mass Index (BMI) 40.0 and over, adult: Secondary | ICD-10-CM | POA: Insufficient documentation

## 2019-07-17 DIAGNOSIS — I10 Essential (primary) hypertension: Secondary | ICD-10-CM | POA: Diagnosis not present

## 2019-07-17 DIAGNOSIS — K219 Gastro-esophageal reflux disease without esophagitis: Secondary | ICD-10-CM | POA: Insufficient documentation

## 2019-07-17 DIAGNOSIS — F419 Anxiety disorder, unspecified: Secondary | ICD-10-CM | POA: Insufficient documentation

## 2019-07-17 DIAGNOSIS — Z79899 Other long term (current) drug therapy: Secondary | ICD-10-CM | POA: Diagnosis not present

## 2019-07-17 DIAGNOSIS — L97129 Non-pressure chronic ulcer of left thigh with unspecified severity: Secondary | ICD-10-CM | POA: Diagnosis not present

## 2019-07-17 DIAGNOSIS — D649 Anemia, unspecified: Secondary | ICD-10-CM | POA: Insufficient documentation

## 2019-07-17 DIAGNOSIS — S71002A Unspecified open wound, left hip, initial encounter: Secondary | ICD-10-CM | POA: Diagnosis not present

## 2019-07-17 DIAGNOSIS — L02416 Cutaneous abscess of left lower limb: Secondary | ICD-10-CM | POA: Diagnosis not present

## 2019-07-17 DIAGNOSIS — Z96642 Presence of left artificial hip joint: Secondary | ICD-10-CM | POA: Diagnosis not present

## 2019-07-17 DIAGNOSIS — E11622 Type 2 diabetes mellitus with other skin ulcer: Secondary | ICD-10-CM | POA: Diagnosis not present

## 2019-07-17 DIAGNOSIS — S81802A Unspecified open wound, left lower leg, initial encounter: Secondary | ICD-10-CM | POA: Diagnosis not present

## 2019-07-17 DIAGNOSIS — S81802S Unspecified open wound, left lower leg, sequela: Secondary | ICD-10-CM | POA: Diagnosis not present

## 2019-07-17 DIAGNOSIS — E041 Nontoxic single thyroid nodule: Secondary | ICD-10-CM | POA: Diagnosis not present

## 2019-07-17 HISTORY — PX: DEBRIDEMENT AND CLOSURE WOUND: SHX5614

## 2019-07-17 LAB — GLUCOSE, CAPILLARY
Glucose-Capillary: 109 mg/dL — ABNORMAL HIGH (ref 70–99)
Glucose-Capillary: 100 mg/dL — ABNORMAL HIGH (ref 70–99)

## 2019-07-17 SURGERY — DEBRIDEMENT, WOUND, WITH CLOSURE
Anesthesia: General | Site: Leg Upper | Laterality: Left

## 2019-07-17 MED ORDER — ACETAMINOPHEN 650 MG RE SUPP: 650.0000 mg | RECTAL | Status: DC | PRN

## 2019-07-17 MED ORDER — CHLORHEXIDINE GLUCONATE 4 % EX LIQD: 1.0000 "application " | Freq: Once | CUTANEOUS | Status: DC

## 2019-07-17 MED ORDER — OXYCODONE HCL 5 MG PO TABS: 5.0000 mg | ORAL_TABLET | ORAL | Status: DC | PRN

## 2019-07-17 MED ORDER — PROPOFOL 10 MG/ML IV BOLUS
INTRAVENOUS | Status: AC
  Filled 2019-07-17: qty 20

## 2019-07-17 MED ORDER — FENTANYL CITRATE (PF) 100 MCG/2ML IJ SOLN
25.0000 ug | INTRAMUSCULAR | Status: DC | PRN
  Administered 2019-07-17: 50 ug via INTRAVENOUS

## 2019-07-17 MED ORDER — SODIUM CHLORIDE 0.9% FLUSH: 3.0000 mL | Freq: Two times a day (BID) | INTRAVENOUS | Status: DC

## 2019-07-17 MED ORDER — OXYCODONE HCL 5 MG PO TABS: 5.0000 mg | ORAL_TABLET | Freq: Once | ORAL | Status: DC | PRN

## 2019-07-17 MED ORDER — METHYLENE BLUE 0.5 % INJ SOLN
INTRAVENOUS | Status: AC
  Filled 2019-07-17: qty 10

## 2019-07-17 MED ORDER — DEXAMETHASONE SODIUM PHOSPHATE 10 MG/ML IJ SOLN
INTRAMUSCULAR | Status: AC
  Filled 2019-07-17: qty 1

## 2019-07-17 MED ORDER — ACETAMINOPHEN 500 MG PO TABS
1000.0000 mg | ORAL_TABLET | Freq: Once | ORAL | Status: AC
  Administered 2019-07-17: 12:00:00 1000 mg via ORAL

## 2019-07-17 MED ORDER — ACETAMINOPHEN 325 MG PO TABS: 650.0000 mg | ORAL_TABLET | ORAL | Status: DC | PRN

## 2019-07-17 MED ORDER — FENTANYL CITRATE (PF) 100 MCG/2ML IJ SOLN
INTRAMUSCULAR | Status: DC | PRN
  Administered 2019-07-17: 100 ug via INTRAVENOUS

## 2019-07-17 MED ORDER — LACTATED RINGERS IV SOLN: INTRAVENOUS | Status: DC | PRN

## 2019-07-17 MED ORDER — FENTANYL CITRATE (PF) 100 MCG/2ML IJ SOLN
INTRAMUSCULAR | Status: AC
  Filled 2019-07-17: qty 2

## 2019-07-17 MED ORDER — ACETAMINOPHEN 500 MG PO TABS
ORAL_TABLET | ORAL | Status: AC
  Filled 2019-07-17: qty 2

## 2019-07-17 MED ORDER — LIDOCAINE-EPINEPHRINE 1 %-1:100000 IJ SOLN
INTRAMUSCULAR | Status: DC | PRN
  Administered 2019-07-17: 9 mL

## 2019-07-17 MED ORDER — PROPOFOL 10 MG/ML IV BOLUS
INTRAVENOUS | Status: DC | PRN
  Administered 2019-07-17: 150 mg via INTRAVENOUS

## 2019-07-17 MED ORDER — CEFAZOLIN SODIUM-DEXTROSE 2-4 GM/100ML-% IV SOLN: 2.0000 g | INTRAVENOUS | Status: DC

## 2019-07-17 MED ORDER — ONDANSETRON HCL 4 MG/2ML IJ SOLN
INTRAMUSCULAR | Status: DC | PRN
  Administered 2019-07-17: 4 mg via INTRAVENOUS

## 2019-07-17 MED ORDER — METHYLENE BLUE 0.5 % INJ SOLN
INTRAVENOUS | Status: DC | PRN
  Administered 2019-07-17: 1 mL via SUBMUCOSAL

## 2019-07-17 MED ORDER — LACTATED RINGERS IV SOLN: INTRAVENOUS | Status: DC

## 2019-07-17 MED ORDER — MIDAZOLAM HCL 2 MG/2ML IJ SOLN
INTRAMUSCULAR | Status: AC
  Filled 2019-07-17: qty 2

## 2019-07-17 MED ORDER — MIDAZOLAM HCL 2 MG/2ML IJ SOLN
INTRAMUSCULAR | Status: DC | PRN
  Administered 2019-07-17: 2 mg via INTRAVENOUS

## 2019-07-17 MED ORDER — SODIUM CHLORIDE 0.9 % IV SOLN: 250.0000 mL | INTRAVENOUS | Status: DC | PRN

## 2019-07-17 MED ORDER — CEFAZOLIN SODIUM-DEXTROSE 2-3 GM-%(50ML) IV SOLR
INTRAVENOUS | Status: DC | PRN
  Administered 2019-07-17: 2 g via INTRAVENOUS

## 2019-07-17 MED ORDER — CEFAZOLIN SODIUM-DEXTROSE 2-4 GM/100ML-% IV SOLN
INTRAVENOUS | Status: AC
  Filled 2019-07-17: qty 100

## 2019-07-17 MED ORDER — SODIUM CHLORIDE 0.9% FLUSH: 3.0000 mL | INTRAVENOUS | Status: DC | PRN

## 2019-07-17 MED ORDER — ONDANSETRON HCL 4 MG/2ML IJ SOLN
INTRAMUSCULAR | Status: AC
  Filled 2019-07-17: qty 2

## 2019-07-17 MED ORDER — FENTANYL CITRATE (PF) 100 MCG/2ML IJ SOLN: 12.5000 ug | INTRAMUSCULAR | Status: DC | PRN

## 2019-07-17 MED ORDER — OXYCODONE HCL 5 MG/5ML PO SOLN: 5.0000 mg | Freq: Once | ORAL | Status: DC | PRN

## 2019-07-17 SURGICAL SUPPLY — 73 items
ADH SKN CLS APL DERMABOND .7 (GAUZE/BANDAGES/DRESSINGS)
APL SWBSTK 6 STRL LF DISP (MISCELLANEOUS) ×1
APPLICATOR COTTON TIP 6 STRL (MISCELLANEOUS) IMPLANT
APPLICATOR COTTON TIP 6IN STRL (MISCELLANEOUS) ×3
BINDER BREAST 3XL (GAUZE/BANDAGES/DRESSINGS) IMPLANT
BINDER BREAST LRG (GAUZE/BANDAGES/DRESSINGS) IMPLANT
BINDER BREAST MEDIUM (GAUZE/BANDAGES/DRESSINGS) IMPLANT
BINDER BREAST XLRG (GAUZE/BANDAGES/DRESSINGS) IMPLANT
BINDER BREAST XXLRG (GAUZE/BANDAGES/DRESSINGS) IMPLANT
BLADE CLIPPER SURG (BLADE) IMPLANT
BLADE HEX COATED 2.75 (ELECTRODE) ×3 IMPLANT
BLADE SURG 10 STRL SS (BLADE) IMPLANT
BLADE SURG 15 STRL LF DISP TIS (BLADE) ×1 IMPLANT
BLADE SURG 15 STRL SS (BLADE) ×3
BNDG GAUZE ELAST 4 BULKY (GAUZE/BANDAGES/DRESSINGS) IMPLANT
COVER BACK TABLE 60X90IN (DRAPES) ×3 IMPLANT
COVER MAYO STAND STRL (DRAPES) ×3 IMPLANT
COVER WAND RF STERILE (DRAPES) IMPLANT
DECANTER SPIKE VIAL GLASS SM (MISCELLANEOUS) ×2 IMPLANT
DERMABOND ADVANCED (GAUZE/BANDAGES/DRESSINGS)
DERMABOND ADVANCED .7 DNX12 (GAUZE/BANDAGES/DRESSINGS) IMPLANT
DRAIN CHANNEL 19F RND (DRAIN) IMPLANT
DRAPE HALF SHEET 70X43 (DRAPES) IMPLANT
DRAPE INCISE IOBAN 66X45 STRL (DRAPES) IMPLANT
DRAPE LAPAROSCOPIC ABDOMINAL (DRAPES) IMPLANT
DRAPE LAPAROTOMY 100X72 PEDS (DRAPES) IMPLANT
DRAPE SURG 17X23 STRL (DRAPES) IMPLANT
DRAPE U-SHAPE 76X120 STRL (DRAPES) IMPLANT
DRSG ADAPTIC 3X8 NADH LF (GAUZE/BANDAGES/DRESSINGS) IMPLANT
DRSG EMULSION OIL 3X3 NADH (GAUZE/BANDAGES/DRESSINGS) IMPLANT
DRSG HYDROCOLLOID 4X4 (GAUZE/BANDAGES/DRESSINGS) IMPLANT
DRSG PAD ABDOMINAL 8X10 ST (GAUZE/BANDAGES/DRESSINGS) ×2 IMPLANT
DRSG TEGADERM 4X10 (GAUZE/BANDAGES/DRESSINGS) IMPLANT
DRSG TELFA 3X8 NADH (GAUZE/BANDAGES/DRESSINGS) IMPLANT
ELECT BLADE 4.0 EZ CLEAN MEGAD (MISCELLANEOUS) ×3
ELECT REM PT RETURN 9FT ADLT (ELECTROSURGICAL) ×3
ELECTRODE BLDE 4.0 EZ CLN MEGD (MISCELLANEOUS) IMPLANT
ELECTRODE REM PT RTRN 9FT ADLT (ELECTROSURGICAL) ×1 IMPLANT
EVACUATOR SILICONE 100CC (DRAIN) IMPLANT
GAUZE SPONGE 4X4 12PLY STRL (GAUZE/BANDAGES/DRESSINGS) IMPLANT
GAUZE SPONGE 4X4 16PLY XRAY LF (GAUZE/BANDAGES/DRESSINGS) ×2 IMPLANT
GAUZE XEROFORM 5X9 LF (GAUZE/BANDAGES/DRESSINGS) IMPLANT
GLOVE BIO SURGEON STRL SZ 6.5 (GLOVE) ×4 IMPLANT
GLOVE BIO SURGEONS STRL SZ 6.5 (GLOVE) ×2
GOWN STRL REUS W/ TWL LRG LVL3 (GOWN DISPOSABLE) ×2 IMPLANT
GOWN STRL REUS W/TWL LRG LVL3 (GOWN DISPOSABLE) ×6
MATRIX WOUND 3-LAYER 5X5 (Tissue) ×1 IMPLANT
MICROMATRIX 1000MG (Tissue) ×3 IMPLANT
NDL HYPO 25X1 1.5 SAFETY (NEEDLE) ×1 IMPLANT
NEEDLE HYPO 25X1 1.5 SAFETY (NEEDLE) ×6 IMPLANT
NS IRRIG 1000ML POUR BTL (IV SOLUTION) ×3 IMPLANT
PACK BASIN DAY SURGERY FS (CUSTOM PROCEDURE TRAY) ×3 IMPLANT
PAD DRESSING TELFA 3X8 NADH (GAUZE/BANDAGES/DRESSINGS) IMPLANT
PENCIL SMOKE EVACUATOR (MISCELLANEOUS) ×3 IMPLANT
SLEEVE SCD COMPRESS KNEE MED (MISCELLANEOUS) ×3 IMPLANT
SOLUTION PARTIC MCRMTRX 1000MG (Tissue) IMPLANT
SPONGE LAP 18X18 RF (DISPOSABLE) ×3 IMPLANT
STAPLER VISISTAT 35W (STAPLE) IMPLANT
SUT MNCRL AB 3-0 PS2 18 (SUTURE) ×2 IMPLANT
SUT MNCRL AB 4-0 PS2 18 (SUTURE) IMPLANT
SUT MON AB 5-0 PS2 18 (SUTURE) ×4 IMPLANT
SUT SILK 3 0 PS 1 (SUTURE) IMPLANT
SWAB COLLECTION DEVICE MRSA (MISCELLANEOUS) IMPLANT
SWAB CULTURE ESWAB REG 1ML (MISCELLANEOUS) IMPLANT
SYR BULB IRRIGATION 50ML (SYRINGE) ×2 IMPLANT
SYR CONTROL 10ML LL (SYRINGE) ×3 IMPLANT
TOWEL GREEN STERILE FF (TOWEL DISPOSABLE) ×6 IMPLANT
TRAY DSU PREP LF (CUSTOM PROCEDURE TRAY) ×3 IMPLANT
TUBE CONNECTING 20'X1/4 (TUBING) ×1
TUBE CONNECTING 20X1/4 (TUBING) ×2 IMPLANT
UNDERPAD 30X36 HEAVY ABSORB (UNDERPADS AND DIAPERS) ×1 IMPLANT
WOUND MATRIX 3-LAYER 5X5 (Tissue) ×1 IMPLANT
YANKAUER SUCT BULB TIP NO VENT (SUCTIONS) ×3 IMPLANT

## 2019-07-17 NOTE — Anesthesia Postprocedure Evaluation (Signed)
Anesthesia Post Note  Patient: Kristin [name]  Procedure(s) Performed: Excision of left leg wound with ACell placement and primary closure (Left Leg Upper)     Patient location during evaluation: PACU Anesthesia Type: General Level of consciousness: awake and alert and oriented Pain management: pain level controlled Vital Signs Assessment: post-procedure vital signs reviewed and stable Respiratory status: spontaneous breathing, nonlabored ventilation and respiratory function stable Cardiovascular status: blood pressure returned to baseline and stable Postop Assessment: no apparent nausea or vomiting Anesthetic complications: no    Last Vitals:  Vitals:   07/17/19 1430 07/17/19 1434  BP: 130/64   Pulse: 87   Resp: 15 14  Temp:    SpO2: 97%     Last Pain:  Vitals:   07/17/19 1434  TempSrc:   PainSc: 4                  Eliany Mccarter A.

## 2019-07-17 NOTE — Interval H&P Note (Signed)
History and Physical Interval Note:  07/17/2019 12:22 PM  Kristin [name]  has presented today for surgery, with the diagnosis of Wound Of Left Leg.  The various methods of treatment have been discussed with the patient and family. After consideration of risks, benefits and other options for treatment, the patient has consented to  Procedure(s) with comments: Excision of left leg wound with ACell placement and primary closure (Left) - 60 min as a surgical intervention.  The patient's history has been reviewed, patient examined, no change in status, stable for surgery.  I have reviewed the patient's chart and labs.  Questions were answered to the patient's satisfaction.     [name]

## 2019-07-17 NOTE — Anesthesia Preprocedure Evaluation (Addendum)
Anesthesia Evaluation  Patient identified by MRN, date of birth, ID band Patient awake    Reviewed: Allergy & Precautions, NPO status , Patient's Chart, lab work & pertinent test results  Airway Mallampati: III  TM Distance: >3 FB Neck ROM: Full  Mouth opening: Limited Mouth Opening  Dental  (+) Missing, Chipped,    Pulmonary neg pulmonary ROS, former smoker,    Pulmonary exam normal breath sounds clear to auscultation       Cardiovascular hypertension, Pt. on home beta blockers and Pt. on medications negative cardio ROS Normal cardiovascular exam Rhythm:Regular Rate:Normal     Neuro/Psych Seizures -, Well Controlled,  PSYCHIATRIC DISORDERS Anxiety Depression    GI/Hepatic Neg liver ROS, GERD  Medicated and Controlled,  Endo/Other  diabetes, Oral Hypoglycemic AgentsMorbid obesity  Renal/GU negative Renal ROS  negative genitourinary   Musculoskeletal  (+) Arthritis ,   Abdominal   Peds  Hematology negative hematology ROS (+)   Anesthesia Other Findings   Reproductive/Obstetrics                          Anesthesia Physical Anesthesia Plan  ASA: III  Anesthesia Plan: General   Post-op Pain Management:    Induction: Intravenous  PONV Risk Score and Plan: 3 and Ondansetron, Dexamethasone and Midazolam  Airway Management Planned: LMA  Additional Equipment:   Intra-op Plan:   Post-operative Plan: Extubation in OR  Informed Consent: I have reviewed the patients History and Physical, chart, labs and discussed the procedure including the risks, benefits and alternatives for the proposed anesthesia with the patient or authorized representative who has indicated his/her understanding and acceptance.     Dental advisory given  Plan Discussed with: CRNA  Anesthesia Plan Comments:         Anesthesia Quick Evaluation

## 2019-07-17 NOTE — Discharge Instructions (Addendum)
INSTRUCTIONS FOR AFTER SURGERY   You will likely have some questions about what to expect following your operation.  The following information will help you and your family understand what to expect when you are discharged from the hospital.  Following these guidelines will help ensure a smooth recovery and reduce risks of complications.  Postoperative instructions include information on: diet, wound care, medications and physical activity.  AFTER SURGERY Expect to go home after the procedure.  In some cases, you may need to spend one night in the hospital for observation.  DIET This surgery does not require a specific diet.  However, I have to mention that the healthier you eat the better your body can start healing. It is important to increasing your protein intake.  This means limiting the foods with added sugar.  Focus on fruits and vegetables and some meat.  If you have any liposuction during your procedure be sure to drink water.  If your urine is bright yellow, then it is concentrated, and you need to drink more water.  As a general rule after surgery, you should have 8 ounces of water every hour while awake.  If you find you are persistently nauseated or unable to take in liquids let us know.  NO TOBACCO USE or EXPOSURE.  This will slow your healing process and increase the risk of a wound.  WOUND CARE Do not shower until you are seen in office. If you have steri-strips / tape directly attached to your skin leave them in place. It is OK to get these wet.  No baths, pools or hot tubs for two weeks. We close your incision to leave the smallest and best-looking scar. No ointment or creams on your incisions until given the go ahead.  Especially not Neosporin (Too many skin reactions with this one).  A few weeks after surgery you can use Mederma and start massaging the scar. We ask you to wear your binder or sports bra for the first 6 weeks around the clock, including while sleeping. This provides  added comfort and helps reduce the fluid accumulation at the surgery site.  ACTIVITY No heavy lifting until cleared by the doctor.  It is OK to walk and climb stairs. In fact, moving your legs is very important to decrease your risk of a blood clot.  It will also help keep you from getting deconditioned.  Every 1 to 2 hours get up and walk for 5 minutes. This will help with a quicker recovery back to normal.  Let pain be your guide so you don't do too much.  NO, you cannot do the spring cleaning and don't plan on taking care of anyone else.  This is your time for TLC.   WORK Everyone returns to work at different times. As a rough guide, most people take at least 1 - 2 weeks off prior to returning to work. If you need documentation for your job, bring the forms to your postoperative follow up visit.  DRIVING Arrange for someone to bring you home from the hospital.  You may be able to drive a few days after surgery but not while taking any narcotics or valium.  BOWEL MOVEMENTS Constipation can occur after anesthesia and while taking pain medication.  It is important to stay ahead for your comfort.  We recommend taking Milk of Magnesia (2 tablespoons; twice a day) while taking the pain pills.  SEROMA This is fluid your body tried to put in the surgical site.  This is normal but if it creates excessive pain and swelling let us know.  It usually decreases in a few weeks.  MEDICATIONS and PAIN CONTROL At your preoperative visit for you history and physical you were given the following medications: 1. An antibiotic: Start this medication when you get home and take according to the instructions on the bottle. 2. Zofran 4 mg:  This is to treat nausea and vomiting.  You can take this every 6 hours as needed and only if needed. 3. Norco (hydrocodone/acetaminophen) 5/325 mg:  This is only to be used after you have taken the motrin or the tylenol. Every 8 hours as needed. Over the counter Medication to  take: 4. Ibuprofen (Motrin) 600 mg:  Take this every 6 hours.  If you have additional pain then take 500 mg of the tylenol.  Only take the Norco after you have tried these two. 5. Miralax or stool softener of choice: Take this according to the bottle if you take the [place] Call your surgeon's office if any of the following occur: . Fever 101 degrees F or greater . Excessive bleeding or fluid from the incision site. . Pain that increases over time without aid from the medications . Redness, warmth, or pus draining from incision sites . Persistent nausea or inability to take in liquids . Severe misshapen area that underwent the operation.  Wound Care with Acell  Guide to Wound Care  Proper wound care may reduce the risk of infection, improve healing rates, and limit scarring.  This is a general guide to help care for and manage wounds treated with ACell MicroMatrix?or Cytal Wound Matrix.   Dressing Changes The frequency of dressing changes can vary based on which product was applied, the size of the wound, or the amount of wound drainage. Dressing inspections are recommended, at least weekly.   If you have a Wound VAC it will be changed in one week after the first time it is applied.  Then it will be changed once or twice a week.   If you don't have a Wound VAC, then place KY gel on the wound daily and cover with gauze.  Dressing Types Primary Dressing:  Non-adherent dressing goes directly over wounds being treated with the powder or sheet (MicroMatrix and/or Cytal).  Secondary Dressing:  Secures the primary dressing in place and provides extra protection, compression, and absorption.  1. Wash Hands - To help decrease the risk of infection, caregivers should wash their hands for a minimum of 20 seconds and may use medical gloves.   2. Remove the Dressings - Avoid removing product from the wound by carefully removing the applicable dressing(s) at the time points recommended  above, or as recommended by the treating physician.  Expected Color and Odor:  It is entirely normal for the wound to have an unpleasant odor and to form a caramel-colored gel as the product absorbs into the wound. It is  important to leave this gel on the wound site.  3. Clean the Wound - Use clean water or saline to gently rinse around the wound surface and remove any excess discharge that may be present on the wound. Do not wipe off any of the caramel-colored gel on the wound.   What to look out for: . Large or increased amount of drainage  . Surrounding skin has worsening redness or hot to touch  . Increased pain in or around the wound  . Flu-like symptoms, fatigue, decreased  appetite, fever  . Hard, crusty wound surface with black or brown coloring  4. Apply New Dressings - Dressings should cover the entire wound and be suitable for maintaining a moist wound environment.  The non-adherent mesh dressing should be left in place.  New dressing should consist of KY Jelly to keep the wound moist and soft gauze secured with a wrap or tape.   Maintain a Hydrated Wound Area It is important to keep the wound area moist throughout the healing process. If the wound appears to be dry during dressing changes, select a dressing that will hydrate the wound and maintain that ideal moist environment. If you are unsure what to do, ask the treating physician.  Remodeling Process Every patient heals differently, and no two cases are the same. The size and location of the wound, product type and layering configurations, and general patient health all contribute to how quickly a wound will heal.  While many factors can influence the rate at which the product absorbs, the following can be used as a general guide.   THINGS TO DO: Refrain from smoking High protein diet with plenty of vegetables and some fruit  Limit simple processed carbohydrates and sugar Protect the wound from trauma Protect the  dressing  Micromatrix powder       Cytal Sheet                 No Tylenol until 6 PM on 07/17/2019    Post Anesthesia Home Care Instructions  Activity: Get plenty of rest for the remainder of the day. A responsible individual must stay with you for 24 hours following the procedure.  For the next 24 hours, DO NOT: -Drive a car -Paediatric nurse -Drink alcoholic beverages -Take any medication unless instructed by your physician -Make any legal decisions or sign important papers.  Meals: Start with liquid foods such as gelatin or soup. Progress to regular foods as tolerated. Avoid greasy, spicy, heavy foods. If nausea and/or vomiting occur, drink only clear liquids until the nausea and/or vomiting subsides. Call your physician if vomiting continues.  Special Instructions/Symptoms: Your throat may feel dry or sore from the anesthesia or the breathing tube placed in your throat during surgery. If this causes discomfort, gargle with warm salt water. The discomfort should disappear within 24 hours.  If you had a scopolamine patch placed behind your ear for the management of post- operative nausea and/or vomiting:  1. The medication in the patch is effective for 72 hours, after which it should be removed.  Wrap patch in a tissue and discard in the trash. Wash hands thoroughly with soap and water. 2. You may remove the patch earlier than 72 hours if you experience unpleasant side effects which may include dry mouth, dizziness or visual disturbances. 3. Avoid touching the patch. Wash your hands with soap and water after contact with the patch.

## 2019-07-17 NOTE — Anesthesia Procedure Notes (Signed)
Procedure Name: LMA Insertion Performed by: Sadie Hazelett M, CRNA Pre-anesthesia Checklist: Patient identified, Emergency Drugs available, Suction available and Patient being monitored Patient Re-evaluated:Patient Re-evaluated prior to induction Oxygen Delivery Method: Circle system utilized Preoxygenation: Pre-oxygenation with 100% oxygen Induction Type: IV induction Ventilation: Mask ventilation without difficulty LMA: LMA inserted LMA Size: 4.0 Number of attempts: 1 Airway Equipment and Method: Bite block Placement Confirmation: positive ETCO2 Tube secured with: Tape Dental Injury: Teeth and Oropharynx as per pre-operative assessment        

## 2019-07-17 NOTE — Op Note (Addendum)
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 J. Heinz, DO  ASSISTANT: Phoebe Sharps, PA  EBL: 5 cc  CONDITION: Stable  COMPLICATIONS: None  INDICATION: The patient, Kristin [name], is a 64 y.o. female born on April 14, 1956, is here for treatment of a left hip wound after orthopedic surgery and hip replacement.   PROCEDURE DETAILS:  The patient was seen prior to surgery and marked.  The IV antibiotics were given. The patient was taken to the operating room and given a general anesthetic. A standard time out was performed and all information was confirmed by those in the room. SCDs were placed.   The left hip was prepped and draped.  Local with epinephrine was injected around the area.  The opening was 1 cm.  A 3 x 3 cm area in an ellipse was drawn around the wound.  Methylene blue was injected in order to be able to follow the wound down.  It was at least 8 cm deep and tract in the superior deep direction towards the right shoulder.  It seemed to go under the proximal portion of what appears to be either the sartorius or iliacus muscle.  It did not appear to be infected.  I excised this 3 x 3 x 8 cm area.  I was very concerned that going any deeper would introduce debridement into the pelvic floor.  I irrigated the wound with saline.  Hemostasis was achieved with electrocautery.  The ACell 1 g powder and 5 x 5 cm sheet was applied.  It was sutured with 5-0 Vicryl to the surrounding skin.  It was then covered with Adaptic which was sutured in place.  KY gel and 4 x 4 gauze was applied. The patient was allowed to wake up and taken to recovery room in stable condition at the end of the case. The family was notified at the end of the case.  I also contacted the orthopedic surgeon at the completion of the case.  We will  look at ordering a CT scan to obtain further information.  The advanced practice practitioner (APP) assisted throughout the case.  The APP was essential in retraction and counter traction when needed to make the case progress smoothly.  This retraction and assistance made it possible to see the tissue plans for the procedure.  The assistance was needed for blood control, tissue re-approximation and assisted with closure of the incision site.  The [place] was signed into law in 2016 which includes the topic of electronic health records.  This provides immediate access to information in MyChart.  This includes consultation notes, operative notes, office notes, lab results and pathology reports.  If you have any questions about what you read please let us know at your next visit or call us at the office.  We are right here with you.

## 2019-07-17 NOTE — Transfer of Care (Signed)
Immediate Anesthesia Transfer of Care Note  Patient: Kristin [name]  Procedure(s) Performed: Excision of left leg wound with ACell placement and primary closure (Left Leg Upper)  Patient Location: PACU  Anesthesia Type:General  Level of Consciousness: awake, alert  and oriented  Airway & Oxygen Therapy: Patient Spontanous Breathing and Patient connected to face mask oxygen  Post-op Assessment: Report given to RN and Post -op Vital signs reviewed and stable  Post vital signs: Reviewed and stable  Last Vitals:  Vitals Value Taken Time  BP    Temp    Pulse    Resp    SpO2      Last Pain:  Vitals:   07/17/19 1150  TempSrc: Oral  PainSc: 0-No pain         Complications: No apparent anesthesia complications

## 2019-07-18 ENCOUNTER — Telehealth: Payer: Self-pay | Admitting: Plastic Surgery

## 2019-07-18 ENCOUNTER — Encounter: Payer: Self-pay | Admitting: *Deleted

## 2019-07-18 LAB — SURGICAL PATHOLOGY

## 2019-07-18 NOTE — Telephone Encounter (Signed)
Pt said she was told she couldn't change the bandage until Friday but the gauze is getting wet and bloody. Is it okay to change the gauze part of the covering or should she wait? Is it normal to be bleeding?

## 2019-07-18 NOTE — Telephone Encounter (Signed)
Called patient back, advised her I spoke with Dr Claudia Desanctis. Leave the adaptic in place, it is ok to remove soiled gauze, put K-Y jelly on wound sit, replace with clean 4x4 gauze and use tape to secure. Call if there are any other questions or problems. Reminded follow up appointment next Friday 07/26/19 at 10:20.

## 2019-07-24 NOTE — Addendum Note (Signed)
Addended by: Wallace Going on: 07/24/2019 08:03 AM   Modules accepted: Orders

## 2019-07-26 ENCOUNTER — Encounter: Payer: Self-pay | Admitting: Plastic Surgery

## 2019-07-26 ENCOUNTER — Other Ambulatory Visit: Payer: Self-pay

## 2019-07-26 ENCOUNTER — Ambulatory Visit (INDEPENDENT_AMBULATORY_CARE_PROVIDER_SITE_OTHER): Payer: Medicaid Other | Admitting: Plastic Surgery

## 2019-07-26 VITALS — BP 139/85 | HR 115 | Temp 96.6°F | Ht 62.0 in | Wt 259.4 lb

## 2019-07-26 DIAGNOSIS — S81802S Unspecified open wound, left lower leg, sequela: Secondary | ICD-10-CM

## 2019-07-26 DIAGNOSIS — F331 Major depressive disorder, recurrent, moderate: Secondary | ICD-10-CM | POA: Diagnosis not present

## 2019-07-26 NOTE — Progress Notes (Signed)
   Subjective:     Patient ID: Kristin [name], female    DOB: 1956/03/30, 64 y.o.   MRN: [id number]  Chief Complaint  Patient presents with  . Post-op Follow-up    Excision of (L) leg wound w/Acell placement and primary closure    HPI: The patient is a 64 y.o. female here for follow-up excision of left hip fistula/wound (3 x 3 x 8 cm) on 07/17/2019 with Dr. Marla [name].  Patient reports she is feeling well.  Reports a slight odor from the wound and drainage.  Denies redness, pain, swelling, fever, shortness of breath, chest pain, and N/V.  Acell still incorporating.  No signs of infection, redness, swelling.  CT scan ordered, not scheduled yet.  Review of Systems  Constitutional: Negative for chills and fever.  HENT: Negative for congestion, rhinorrhea and sore throat.   Respiratory: Negative for cough and shortness of breath.   Cardiovascular: Negative for chest pain and leg swelling.  Gastrointestinal: Negative for constipation, diarrhea, nausea and vomiting.  Skin: Positive for wound (slight odor reported, draingae). Negative for color change, pallor and rash.     Objective:   Vital Signs BP 139/85 (BP Location: Left Arm, Patient Position: Sitting, Cuff Size: Large)   Pulse (!) 115   Temp (!) 96.6 F (35.9 C) (Temporal)   Ht 5\' 2"  (1.575 m)   Wt 259 lb 6.4 oz (117.7 kg)   SpO2 96%   BMI 47.44 kg/m  Vital Signs and Nursing Note Reviewed  Physical Exam  Constitutional: She is oriented to person, place, and time and well-developed, well-nourished, and in no distress.  HENT:  Head: Normocephalic and atraumatic.  Eyes: EOM are normal.  Pulmonary/Chest: Effort normal.  Musculoskeletal:        General: Normal range of motion.     Cervical back: Normal range of motion.       Legs:     Comments: Wound healing. Some serous drainage. No signs of infection, redness, swelling.   Neurological: She is alert and oriented to person, place, and time. Gait normal.  Skin: Skin is  warm and dry. No rash noted. No erythema. No pallor.  Psychiatric: Mood, memory, affect and judgment normal.      Assessment/Plan:     ICD-10-CM   1. Wound of left leg, sequela  S81.802S     Overall Kristin [name] is healing well.  Serous drainage present.  No signs of infection, redness, swelling.  CT scan order placed previously, not scheduled yet.  She will want to make sure she gets this scheduled.  Continue daily dressing changes with K-Y jelly and gauze.  A bit of odor is normal with the ACell.  Follow-up in 1 week.  Pictures were obtained of the patient and placed in the chart with the patient's or guardian's permission.  The [place] was signed into law in 2016 which includes the topic of electronic health records.  This provides immediate access to information in MyChart.  This includes consultation notes, operative notes, office notes, lab results and pathology reports.  If you have any questions about what you read please let us know at your next visit or call us at the office.  We are right here with you.   Threasa Heads, PA-C 07/26/2019, 1:29 PM

## 2019-08-01 ENCOUNTER — Telehealth: Payer: Self-pay

## 2019-08-01 NOTE — Telephone Encounter (Signed)

## 2019-08-02 ENCOUNTER — Encounter: Payer: Self-pay | Admitting: Plastic Surgery

## 2019-08-02 ENCOUNTER — Ambulatory Visit (INDEPENDENT_AMBULATORY_CARE_PROVIDER_SITE_OTHER): Payer: Medicaid Other | Admitting: Plastic Surgery

## 2019-08-02 ENCOUNTER — Other Ambulatory Visit: Payer: Self-pay

## 2019-08-02 VITALS — BP 119/81 | HR 97 | Temp 97.1°F | Ht 62.0 in | Wt 257.4 lb

## 2019-08-02 DIAGNOSIS — S71002A Unspecified open wound, left hip, initial encounter: Secondary | ICD-10-CM

## 2019-08-02 NOTE — Progress Notes (Addendum)
   Subjective:     Patient ID: Kristin [name], female    DOB: [birth year], 64 y.o.   MRN: [id number]  The patient is a 63 y.o. female here for follow-up after excision of left hip fistula/wound (3 x 3 x 8 cm) on 07/17/2019 with Dr. Marla Roe.  Patient reports she is feeling well.  Reports an odor from the wound and some drainage. Reports pain of the wound that is well controlled with ice and tylenol. Denies redness, swelling, fever, shortness of breath, chest pain, and nausea/vomiting. Wound is healing well and Acell has incorporated. No signs of infection, redness, swelling. CT scan ordered, but has not yet been scheduled.  Need to get CT scan done as soon as possible. Added donated Acell today. Do daily dressing changes of KY Jelly applied to adaptic and cover with gauze.  Follow-up in 2 weeks. Call office with any questions/concerns.   The [place] was signed into law in 2016 which includes the topic of electronic health records.  This provides immediate access to information in MyChart.  This includes consultation notes, operative notes, office notes, lab results and pathology reports.  If you have any questions about what you read please let us know at your next visit or call us at the office.  We are right here with you.   Threasa Heads, PA-C 08/02/2019, 11:41 AM

## 2019-08-07 ENCOUNTER — Other Ambulatory Visit (INDEPENDENT_AMBULATORY_CARE_PROVIDER_SITE_OTHER): Payer: Medicaid Other | Admitting: Plastic Surgery

## 2019-08-07 DIAGNOSIS — S81802S Unspecified open wound, left lower leg, sequela: Secondary | ICD-10-CM

## 2019-08-07 NOTE — Progress Notes (Signed)
Patient is having CT abdomen and pelvis with contrast on Friday 2/5. Order placed for BMP.

## 2019-08-09 ENCOUNTER — Ambulatory Visit (HOSPITAL_COMMUNITY)
Admission: RE | Admit: 2019-08-09 | Discharge: 2019-08-09 | Disposition: A | Discharge status: Home / Self Care | Payer: Medicaid Other | Source: Ambulatory Visit | Attending: Plastic Surgery | Admitting: Plastic Surgery

## 2019-08-09 ENCOUNTER — Other Ambulatory Visit: Payer: Self-pay

## 2019-08-09 ENCOUNTER — Other Ambulatory Visit (INDEPENDENT_AMBULATORY_CARE_PROVIDER_SITE_OTHER): Payer: Medicaid Other | Admitting: Plastic Surgery

## 2019-08-09 DIAGNOSIS — S81802S Unspecified open wound, left lower leg, sequela: Secondary | ICD-10-CM | POA: Insufficient documentation

## 2019-08-09 DIAGNOSIS — K5730 Diverticulosis of large intestine without perforation or abscess without bleeding: Secondary | ICD-10-CM | POA: Diagnosis not present

## 2019-08-09 DIAGNOSIS — S71002A Unspecified open wound, left hip, initial encounter: Secondary | ICD-10-CM

## 2019-08-09 MED ORDER — IOHEXOL 300 MG/ML  SOLN
100.0000 mL | Freq: Once | INTRAMUSCULAR | Status: AC | PRN
  Administered 2019-08-09: 100 mL via INTRAVENOUS

## 2019-08-09 NOTE — Progress Notes (Signed)
Placing order for BMP for today's CT scan with contrast so they can check kidney function prior to administering contrast. Blood will be drawn upon her arrival. Lab cancelled order entered earlier this week.

## 2019-08-14 ENCOUNTER — Ambulatory Visit: Payer: Medicaid Other | Attending: Internal Medicine

## 2019-08-14 DIAGNOSIS — Z20822 Contact with and (suspected) exposure to covid-19: Secondary | ICD-10-CM

## 2019-08-15 LAB — NOVEL CORONAVIRUS, NAA: SARS-CoV-2, NAA: NOT DETECTED

## 2019-08-16 ENCOUNTER — Ambulatory Visit: Payer: Medicaid Other | Admitting: Plastic Surgery

## 2019-08-22 ENCOUNTER — Telehealth: Payer: Self-pay | Admitting: Plastic Surgery

## 2019-08-22 NOTE — Telephone Encounter (Signed)

## 2019-08-23 ENCOUNTER — Ambulatory Visit (INDEPENDENT_AMBULATORY_CARE_PROVIDER_SITE_OTHER): Payer: Medicaid Other | Admitting: Plastic Surgery

## 2019-08-23 ENCOUNTER — Telehealth: Payer: Self-pay

## 2019-08-23 ENCOUNTER — Other Ambulatory Visit: Payer: Self-pay

## 2019-08-23 ENCOUNTER — Encounter: Payer: Self-pay | Admitting: Plastic Surgery

## 2019-08-23 VITALS — BP 141/87 | HR 103 | Temp 97.3°F | Ht 64.0 in | Wt 263.2 lb

## 2019-08-23 DIAGNOSIS — S71002A Unspecified open wound, left hip, initial encounter: Secondary | ICD-10-CM

## 2019-08-23 NOTE — Progress Notes (Signed)
Patient is a 64 year old female here with follow-up after excision of left hip wound/fistula (3 x 3 x 8 cm) on 07/17/2019 with Dr. Marla Roe.  Patient reports she is feeling very well and feels like the wound is improving.  Reports some odor from the wound still and some drainage.  Denies pain, fever, chest pain, nausea/vomiting, or redness around the wound.  Wound is healing well and ACell is incorporated.  No signs of infection, swelling.  Some sanguinous drainage observed.  Wound approximately 3 x 2 cm with small 0.5 x 0.5 x 1 cm opening in the middle.  CT Results:  1. Findings within the left hip arthroplasty most consistent with chronic inflammation/infection or loosening. 2. 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 Evaluation somewhat limited due to streak artifact. 3. Exophytic hypodense structure off the lateral aspect right kidney, which likely reflects a cyst given low attenuation values. However, this is new since prior CT and demonstrates lobular margins. Dedicated renal CT, MRI, or ultrasound could be considered for follow-up. 4. Fluid surrounding the acetabular component of the left hip arthroplasty is contiguous with the soft tissue defect and cutaneous fistula.  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  Patient instructed to follow up with [name] about the hip arthroplasty. Should also follow up with PCP about Kidney cyst.  Hip wound is improving. May shower. Daily dressing changes with collagen, and bandage.   Follow-up in 2 weeks. Call office with any questions/concerns or if condition worsens.  The [place] was signed into law in 2016 which includes the topic of electronic health records.  This provides immediate access to information in MyChart.  This includes consultation notes, operative notes, office notes, lab results and pathology  reports.  If you have any questions about what you read please let us know at your next visit or call us at the office.  We are right here with you.

## 2019-08-23 NOTE — Telephone Encounter (Signed)
Faxed order for medical supplies to [facility].

## 2019-08-27 ENCOUNTER — Telehealth: Payer: Self-pay | Admitting: Plastic Surgery

## 2019-08-27 ENCOUNTER — Other Ambulatory Visit: Payer: Self-pay

## 2019-08-27 ENCOUNTER — Encounter: Payer: Self-pay | Admitting: Orthopaedic Surgery

## 2019-08-27 ENCOUNTER — Ambulatory Visit (INDEPENDENT_AMBULATORY_CARE_PROVIDER_SITE_OTHER): Payer: Medicaid Other | Admitting: Orthopaedic Surgery

## 2019-08-27 DIAGNOSIS — Z6841 Body Mass Index (BMI) 40.0 and over, adult: Secondary | ICD-10-CM | POA: Insufficient documentation

## 2019-08-27 DIAGNOSIS — S81802S Unspecified open wound, left lower leg, sequela: Secondary | ICD-10-CM

## 2019-08-27 MED ORDER — SULFAMETHOXAZOLE-TRIMETHOPRIM 800-160 MG PO TABS: 1.0000 | ORAL_TABLET | Freq: Two times a day (BID) | ORAL | 0 refills | Status: DC

## 2019-08-27 NOTE — Telephone Encounter (Signed)
Returned patients call. Advised to change the collagen and bandages daily. Patient understood and agreed.

## 2019-08-27 NOTE — Telephone Encounter (Signed)
Patient said she picked up the collagen and bandages but was unsure of how often she should change them. Please call her to advise.

## 2019-08-27 NOTE — Progress Notes (Signed)
Office Visit Note   Patient: Kristin [name]           Date of Birth: Nov 17, 1955           MRN: [id number] Visit Date: 08/27/2019              Requested by: Bonnita Hollow, MD [address],  [place] [address] PCP: Bonnita Hollow, MD   Assessment & Plan: Visit Diagnoses:  1. Wound of left leg, sequela   2. Body mass index 45.0-49.9, adult ([place])   3. Morbid obesity ([place])     Plan: Impression is stable left total hip replacement.  I reviewed the CT scan in detail and I do not believe that this is loose based on what she is describing.  I would like to place her on antibiotics while she has open wound but otherwise no orthopedic intervention indicated at this time.  I like to recheck her in 6 weeks.  Follow-Up Instructions: Return in about 6 weeks (around 10/08/2019).   Orders:  Orders Placed This Encounter  Procedures  . Amb Ref to Medical Weight Management   Meds ordered this encounter  Medications  . sulfamethoxazole-trimethoprim (BACTRIM DS) 800-160 MG tablet    Sig: Take 1 tablet by mouth 2 (two) times daily.    Dispense:  84 tablet    Refill:  0      Procedures: No procedures performed   Clinical Data: No additional findings.   Subjective: Chief Complaint  Patient presents with  . Left Hip - Pain    Kristin [name] is a 64 year old female who is status post left total hip placement that was complicated by wound infection.  She currently has a wound that is being actively managed by Dr. Marla Roe.  She was sent back to [place] for recent CT scan findings concerning for prosthesis loosening.  She reports no start of pain in the thigh or groin.  She has some very mild discomfort that she has had since the beginning of surgery.  She denies any constitutional symptoms.   Review of Systems  Constitutional: Negative.   HENT: Negative.   Eyes: Negative.   Respiratory: Negative.   Cardiovascular: Negative.   Endocrine: Negative.   Musculoskeletal:  Negative.   Neurological: Negative.   Hematological: Negative.   Psychiatric/Behavioral: Negative.   All other systems reviewed and are negative.    Objective: Vital Signs: There were no vitals taken for this visit.  Physical Exam Vitals and nursing note reviewed.  Constitutional:      Appearance: She is well-developed.  Pulmonary:     Effort: Pulmonary effort is normal.  Skin:    General: Skin is warm.     Capillary Refill: Capillary refill takes less than 2 seconds.  Neurological:     Mental Status: She is alert and oriented to person, place, and time.  Psychiatric:        Behavior: Behavior normal.        Thought Content: Thought content normal.        Judgment: Judgment normal.     Ortho Exam Left hip exam shows a small wound with a stable base.  Scant serous drainage.  No evidence of infection.  She has painless logroll and movement of the hip.  She ambulates without any assistive devices. Specialty Comments:  No specialty comments available.  Imaging: No results found.   PMFS History: Patient Active Problem List   Diagnosis Date Noted  . Body mass index 45.0-49.9,  adult ([place]) 08/27/2019  . Intertrigo 07/09/2019  . Facial rash 07/27/2018  . Body mass index 40.0-44.9, adult ([place]) 02/15/2018  . Fatigue associated with anemia 02/07/2018  . Unspecified open wound, left hip, initial encounter 01/19/2018  . Wound of left leg, sequela 10/03/2016  . 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 Status post left hip replacement 06/01/2016  . Sinus tachycardia 05/19/2016  . History of adenomatous polyp of colon 03/21/2016  . Diabetes ([place]) 01/21/2016  . Thyroid nodule 12/25/2014  . Infiltrate noted on imaging study   . Cervical spine arthritis 11/14/2014  . Primary osteoarthritis of left hip 11/14/2014  . Loss of consciousness ([place]) 11/14/2014  . Chest pain 03/26/2014  . Musculoskeletal chest and rib pain 03/26/2014  . Rib pain on right side  02/28/2014  . Groin pain 04/18/2013  . UNSPECIFIED RENAL SCLEROSIS 01/16/2009  . GERD, SEVERE 05/25/2007  . Morbid obesity ([place]) 04/03/2007  . HYPERLIPIDEMIA 08/31/2006  . Major depression, recurrent ([place]) 08/31/2006  . 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 Former smoker 08/31/2006  . HYPERTENSION, BENIGN SYSTEMIC 08/31/2006  . GASTRIC ULCER ACUTE WITHOUT HEMORRHAGE 08/31/2006  . CONVULSIONS, SEIZURES, NOS 08/31/2006   Past Medical History:  Diagnosis Date  . Allergy   . Anemia   . Anxiety   . Arthritis   . Back pain, chronic   . Bronchitis   . Cough   . Depression   . Diabetes mellitus without complication ([place])   . GERD (gastroesophageal reflux disease)   . Hyperlipidemia   . Hypertension   . IBS (irritable bowel syndrome)   . Obesity   . Pneumonia 2016  . 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 Seizures ([place])    last >10 yrs ago- recorded 01/18/18  . Somatization disorder   . Tobacco abuse     Family History  Problem Relation Age of Onset  . Other Mother        failed surgery  . Heart Problems Father        poss MI, not sure:per pt  . Alzheimer's disease Maternal Grandmother   . Heart disease Maternal Grandfather   . Colon cancer Maternal Uncle        not sure age of onset    Past Surgical History:  Procedure Laterality Date  . ABDOMINAL HYSTERECTOMY    . ANTERIOR HIP REVISION Left 07/25/2016   Procedure: LEFT HIP IRRIGATION AND DEBRIDEMENT, REVISION OF HEAD AND LINER;  Surgeon: Leandrew Koyanagi, MD;  Location: [facility];  Service: Orthopedics;  Laterality: Left;  . CHOLECYSTECTOMY    . COLONOSCOPY    . DEBRIDEMENT AND CLOSURE WOUND Left 07/17/2019   Procedure: Excision of left leg wound with ACell placement and primary closure;  Surgeon: Wallace Going, DO;  Location: [facility];  Service: Plastics;  Laterality: Left;  60 min  . HIP DEBRIDEMENT Left 01/19/2018   Procedure(s) Performed: IRRIGATION AND DEBRIDEMENT LEFT HIP (Left )  .  INCISION AND DRAINAGE HIP Left 08/03/2016   Procedure: IRRIGATION AND DEBRIDEMENT LEFT HIP, VAC PLACEMENT;  Surgeon: Leandrew Koyanagi, MD;  Location: [facility];  Service: Orthopedics;  Laterality: Left;  . INCISION AND DRAINAGE HIP Left 01/19/2018   Procedure: IRRIGATION AND DEBRIDEMENT LEFT HIP;  Surgeon: Leandrew Koyanagi, MD;  Location: [facility];  Service: Orthopedics;  Laterality: Left;  . kidney stent Right 2002  . TONSILLECTOMY Bilateral 12/06/2014   Procedure: TONSILLECTOMY, BILATERAL;  Surgeon: Ruby Cola, MD;  [facility];  Service: ENT;  Laterality: Bilateral;  . TONSILLECTOMY    . TOTAL HIP ARTHROPLASTY Left 06/01/2016   Procedure: LEFT TOTAL HIP ARTHROPLASTY ANTERIOR APPROACH;  Surgeon: Leandrew Koyanagi, MD;  Location: [facility];  Service: Orthopedics;  Laterality: Left;  . TUBAL LIGATION    . WOUND EXPLORATION Left 01/19/2018   Procedure: WOUND EXPLORATION;  Surgeon: Leandrew Koyanagi, MD;  Location: [facility];  Service: Orthopedics;  Laterality: Left;   Social History   Occupational History  . Not on file  Tobacco Use  . Smoking status: Former Smoker    Packs/day: 0.25    Years: 0.50    Pack years: 0.12    Types: Cigarettes    Quit date: 01/27/2014    Years since quitting: 5.5  . Smokeless tobacco: Never Used  Substance and Sexual Activity  . Alcohol use: No  . Drug use: No  . Sexual activity: Not on file

## 2019-09-05 NOTE — Progress Notes (Signed)
Patient is a 64 year old female here for follow-up after excision of left hip wound/fistula (3 x 3 x 8 cm) on 07/17/2019 with Dr. Marla Roe.  On 08/23/2019 visit wound was approximately 3 x 2 cm with small 0.5 x 0.5 x 1 cm opening in the middle.  She followed up with her orthopedist who saw no signs of loosening of the hip implant.  He placed her on antibiotics due to having an open wound over the area of the implant.  Ms. Delre reports overall she is doing well.  Denies fever, headache, chest pain, shortness of breath, N/V.  Insurance denied coverage for collagen so she went out and bought some at the drugstore  ($100 per 10 pk).  Wound has improved significantly filling in well with good granulation tissue.  Current wound measures 2.5 cm x 1.5 cm x 0.5 cm.  Continue daily dressing changes.  Use cellerate and Mepilex border dressing.  Moisturize the area well around the wound.  May do daily circular massage on areas of hardness.  Follow-up in 3 weeks.  Call office with any questions/concerns or if condition worsens.   The [place] was signed into law in 2016 which includes the topic of electronic health records.  This provides immediate access to information in MyChart.  This includes consultation notes, operative notes, office notes, lab results and pathology reports.  If you have any questions about what you read please let us know at your next visit or call us at the office.  We are right here with you.

## 2019-09-06 ENCOUNTER — Ambulatory Visit (INDEPENDENT_AMBULATORY_CARE_PROVIDER_SITE_OTHER): Payer: Medicaid Other | Admitting: Plastic Surgery

## 2019-09-06 ENCOUNTER — Other Ambulatory Visit: Payer: Self-pay

## 2019-09-06 ENCOUNTER — Telehealth: Payer: Self-pay

## 2019-09-06 ENCOUNTER — Encounter: Payer: Self-pay | Admitting: Plastic Surgery

## 2019-09-06 VITALS — BP 136/81 | HR 88 | Temp 98.2°F | Ht 62.0 in | Wt 260.0 lb

## 2019-09-06 DIAGNOSIS — S81802S Unspecified open wound, left lower leg, sequela: Secondary | ICD-10-CM

## 2019-09-06 NOTE — Telephone Encounter (Signed)
Orders per [facility] faxed to [employer] for dressing supplies to be delivered to pt's home Order scanned to [facility] & to pt's chart & message forwarded to [facility]

## 2019-09-09 DIAGNOSIS — F411 Generalized anxiety disorder: Secondary | ICD-10-CM | POA: Diagnosis not present

## 2019-09-10 ENCOUNTER — Telehealth: Payer: Self-pay | Admitting: Plastic Surgery

## 2019-09-10 NOTE — Telephone Encounter (Signed)
Pt called to say that she got a call from the medical supply company and medicaid will not pay for it. She was told to call back if that was the case. She would like a call back please.

## 2019-09-10 NOTE — Telephone Encounter (Signed)
Call back to pt re: denial from [employer] for wound care supplies  I called [employer]- & was told that pt's insurance will not cover these products   pt does not want to purchase the supplies out of pocket. I spoke to [place], [place]- she advised pt to cut the collagen dressing & [place] what she has left- then she can use vaseline & gauze when changing daily Pt agrees with plan of care & will call for any concerns

## 2019-09-19 ENCOUNTER — Other Ambulatory Visit: Payer: Self-pay | Admitting: Family Medicine

## 2019-09-19 DIAGNOSIS — Z1231 Encounter for screening mammogram for malignant neoplasm of breast: Secondary | ICD-10-CM

## 2019-09-23 ENCOUNTER — Telehealth: Payer: Self-pay

## 2019-09-23 ENCOUNTER — Telehealth: Payer: Self-pay | Admitting: Orthopaedic Surgery

## 2019-09-23 NOTE — Telephone Encounter (Signed)
Patient would like to know if she can travel May 2021.     CB [phone]

## 2019-09-23 NOTE — Telephone Encounter (Signed)
Fine by me as long as it's ok with Dr. Marla Roe

## 2019-09-24 NOTE — Telephone Encounter (Signed)
Thank you :)

## 2019-09-24 NOTE — Telephone Encounter (Signed)
Patient called and was advised of the message from [name].

## 2019-09-24 NOTE — Telephone Encounter (Signed)
Called patient no answer. LMOM to return call to advise  on message below.

## 2019-09-26 ENCOUNTER — Other Ambulatory Visit: Payer: Self-pay | Admitting: *Deleted

## 2019-09-26 MED ORDER — METFORMIN HCL 500 MG PO TABS: ORAL_TABLET | ORAL | 0 refills | Status: DC

## 2019-09-26 MED ORDER — SIMVASTATIN 20 MG PO TABS: 20.0000 mg | ORAL_TABLET | Freq: Every day | ORAL | 3 refills | Status: DC

## 2019-09-26 NOTE — Progress Notes (Signed)
Patient is a 64 year old female here for follow-up after excision of left hip wound/fistula (3 x 3 x 8 cm) on 07/17/2019 with Dr. Marla Roe.  On 08/23/2019 visit wound was approximately 3 x 2 cm with small 0.5 x 0.5 x 1 cm opening in the middle.  She followed up with her orthopedist who saw no signs of loosening of the hip implant and was placed on antibiotics due to having an open wound over the area of the implant.  At her visit on 09/06/2019 the wound measured 2.5 cm x 1.5 cm x 0.5 cm.  Today she reports clear drainage and sometimes trace blood from the wound. Denies fever and N/V. Wound measures 1 x 1 cm of granulation tissue above the skin with clear drainage. Applied silver nitrate and packed wound with gauze.  Do daily dressing changes of gauze packed into wound, cover with gauze and secure. Follow up in 2-3 weeks.  Call office with any questions/concerns.  Pictures were obtained of the patient and placed in the chart with the patient's or guardian's permission.  The [place] was signed into law in 2016 which includes the topic of electronic health records.  This provides immediate access to information in MyChart.  This includes consultation notes, operative notes, office notes, lab results and pathology reports.  If you have any questions about what you read please let us know at your next visit or call us at the office.  We are right here with you.

## 2019-09-27 ENCOUNTER — Other Ambulatory Visit: Payer: Self-pay

## 2019-09-27 ENCOUNTER — Ambulatory Visit (INDEPENDENT_AMBULATORY_CARE_PROVIDER_SITE_OTHER): Payer: Medicaid Other | Admitting: Plastic Surgery

## 2019-09-27 ENCOUNTER — Encounter: Payer: Self-pay | Admitting: Plastic Surgery

## 2019-09-27 VITALS — BP 137/86 | HR 100 | Temp 97.1°F | Ht 62.0 in | Wt 260.0 lb

## 2019-09-27 DIAGNOSIS — Z96642 Presence of left artificial hip joint: Secondary | ICD-10-CM

## 2019-09-27 DIAGNOSIS — S71002A Unspecified open wound, left hip, initial encounter: Secondary | ICD-10-CM

## 2019-10-01 ENCOUNTER — Other Ambulatory Visit: Payer: Self-pay

## 2019-10-02 MED ORDER — HYDROCHLOROTHIAZIDE 25 MG PO TABS: 25.0000 mg | ORAL_TABLET | Freq: Every day | ORAL | 0 refills | Status: DC

## 2019-10-08 ENCOUNTER — Other Ambulatory Visit: Payer: Self-pay

## 2019-10-08 ENCOUNTER — Encounter: Payer: Self-pay | Admitting: Orthopaedic Surgery

## 2019-10-08 ENCOUNTER — Telehealth: Payer: Self-pay

## 2019-10-08 ENCOUNTER — Ambulatory Visit (INDEPENDENT_AMBULATORY_CARE_PROVIDER_SITE_OTHER): Payer: Medicaid Other | Admitting: Orthopaedic Surgery

## 2019-10-08 VITALS — Ht 62.0 in | Wt 260.0 lb

## 2019-10-08 DIAGNOSIS — S81802S Unspecified open wound, left lower leg, sequela: Secondary | ICD-10-CM | POA: Diagnosis not present

## 2019-10-08 DIAGNOSIS — Z96642 Presence of left artificial hip joint: Secondary | ICD-10-CM

## 2019-10-08 MED ORDER — SULFAMETHOXAZOLE-TRIMETHOPRIM 800-160 MG PO TABS: 1.0000 | ORAL_TABLET | Freq: Two times a day (BID) | ORAL | 0 refills | Status: DC

## 2019-10-08 NOTE — Telephone Encounter (Signed)
Called Patient back. She wanted to check the status of [facility] referral.   Looks like they sent her a welcome email.

## 2019-10-08 NOTE — Progress Notes (Signed)
Office Visit Note   Patient: Kristin [name]           Date of Birth: [birth year]           MRN: [id number] Visit Date: 10/08/2019              Requested by: Bonnita Hollow, MD [address],  [place] [address] PCP: Bonnita Hollow, MD   Assessment & Plan: Visit Diagnoses:  1. Wound of left leg, sequela   2. Presence of left artificial hip joint     Plan: Today I refilled her Bactrim for another 6 weeks.  She will see Dr. Elisabeth Cara next week for continued wound treatment.  I would like to recheck her in 6 months with standing AP pelvis and lateral hip.  Follow-Up Instructions: Return in about 6 months (around 04/08/2020).   Orders:  No orders of the defined types were placed in this encounter.  Meds ordered this encounter  Medications  . sulfamethoxazole-trimethoprim (BACTRIM DS) 800-160 MG tablet    Sig: Take 1 tablet by mouth 2 (two) times daily.    Dispense:  84 tablet    Refill:  0      Procedures: No procedures performed   Clinical Data: No additional findings.   Subjective: Chief Complaint  Patient presents with  . Left Leg - Follow-up    Kristin [name] returns today for a wound check.  Overall she is doing well has been walking without a cane more frequently.  She is scheduled to see Dr. Marla Roe next week.  Overall the wound is doing well.   Review of Systems   Objective: Vital Signs: Ht 5\' 2"  (1.575 m)   Wt 260 lb (117.9 kg)   BMI 47.55 kg/m   Physical Exam  Ortho Exam Left hip shows much smaller superficial wound.  No signs of infection or active drainage. Specialty Comments:  No specialty comments available.  Imaging: No results found.   PMFS History: Patient Active Problem List   Diagnosis Date Noted  . Body mass index 45.0-49.9, adult ([place]) 08/27/2019  . Intertrigo 07/09/2019  . Facial rash 07/27/2018  . Body mass index 40.0-44.9, adult ([place]) 02/15/2018  . Fatigue associated with anemia 02/07/2018  . Unspecified  open wound, left hip, initial encounter 01/19/2018  . Wound of left leg, sequela 10/03/2016  . 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 Status post left hip replacement 06/01/2016  . Sinus tachycardia 05/19/2016  . History of adenomatous polyp of colon 03/21/2016  . Diabetes ([place]) 01/21/2016  . Thyroid nodule 12/25/2014  . Infiltrate noted on imaging study   . Cervical spine arthritis 11/14/2014  . Primary osteoarthritis of left hip 11/14/2014  . Loss of consciousness ([place]) 11/14/2014  . Chest pain 03/26/2014  . Musculoskeletal chest and rib pain 03/26/2014  . Rib pain on right side 02/28/2014  . Groin pain 04/18/2013  . UNSPECIFIED RENAL SCLEROSIS 01/16/2009  . GERD, SEVERE 05/25/2007  . Morbid obesity ([place]) 04/03/2007  . HYPERLIPIDEMIA 08/31/2006  . Major depression, recurrent ([place]) 08/31/2006  . 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 Former smoker 08/31/2006  . HYPERTENSION, BENIGN SYSTEMIC 08/31/2006  . GASTRIC ULCER ACUTE WITHOUT HEMORRHAGE 08/31/2006  . CONVULSIONS, SEIZURES, NOS 08/31/2006   Past Medical History:  Diagnosis Date  . Allergy   . Anemia   . Anxiety   . Arthritis   . Back pain, chronic   . Bronchitis   . Cough   . Depression   .  Diabetes mellitus without complication ([place])   . GERD (gastroesophageal reflux disease)   . Hyperlipidemia   . Hypertension   . IBS (irritable bowel syndrome)   . Obesity   . Pneumonia 2016  . 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 Seizures ([place])    last >10 yrs ago- recorded 01/18/18  . Somatization disorder   . Tobacco abuse     Family History  Problem Relation Age of Onset  . Other Mother        failed surgery  . Heart Problems Father        poss MI, not sure:per pt  . Alzheimer's disease Maternal Grandmother   . Heart disease Maternal Grandfather   . Colon cancer Maternal Uncle        not sure age of onset    Past Surgical History:  Procedure Laterality Date  . ABDOMINAL  HYSTERECTOMY    . ANTERIOR HIP REVISION Left 07/25/2016   Procedure: LEFT HIP IRRIGATION AND DEBRIDEMENT, REVISION OF HEAD AND LINER;  Surgeon: Leandrew Koyanagi, MD;  Location: [facility];  Service: Orthopedics;  Laterality: Left;  . CHOLECYSTECTOMY    . COLONOSCOPY    . DEBRIDEMENT AND CLOSURE WOUND Left 07/17/2019   Procedure: Excision of left leg wound with ACell placement and primary closure;  Surgeon: Wallace Going, DO;  Location: [facility];  Service: Plastics;  Laterality: Left;  60 min  . HIP DEBRIDEMENT Left 01/19/2018   Procedure(s) Performed: IRRIGATION AND DEBRIDEMENT LEFT HIP (Left )  . INCISION AND DRAINAGE HIP Left 08/03/2016   Procedure: IRRIGATION AND DEBRIDEMENT LEFT HIP, VAC PLACEMENT;  Surgeon: Leandrew Koyanagi, MD;  Location: [facility];  Service: Orthopedics;  Laterality: Left;  . INCISION AND DRAINAGE HIP Left 01/19/2018   Procedure: IRRIGATION AND DEBRIDEMENT LEFT HIP;  Surgeon: Leandrew Koyanagi, MD;  Location: [facility];  Service: Orthopedics;  Laterality: Left;  . kidney stent Right 2002  . TONSILLECTOMY Bilateral 12/06/2014   Procedure: TONSILLECTOMY, BILATERAL;  Surgeon: Ruby Cola, MD;  Location: [facility];  Service: ENT;  Laterality: Bilateral;  . TONSILLECTOMY    . TOTAL HIP ARTHROPLASTY Left 06/01/2016   Procedure: LEFT TOTAL HIP ARTHROPLASTY ANTERIOR APPROACH;  Surgeon: Leandrew Koyanagi, MD;  Location: [facility];  Service: Orthopedics;  Laterality: Left;  . TUBAL LIGATION    . WOUND EXPLORATION Left 01/19/2018   Procedure: WOUND EXPLORATION;  Surgeon: Leandrew Koyanagi, MD;  Location: [facility];  Service: Orthopedics;  Laterality: Left;   Social History   Occupational History  . Not on file  Tobacco Use  . Smoking status: Former Smoker    Packs/day: 0.25    Years: 0.50    Pack years: 0.12    Types: Cigarettes    Quit date: 01/27/2014    Years since quitting: 5.6  . Smokeless tobacco: Never Used  Substance and Sexual Activity  . Alcohol use: No  . Drug use: No  . Sexual  activity: Not on file

## 2019-10-09 ENCOUNTER — Ambulatory Visit
Admission: RE | Admit: 2019-10-09 | Discharge: 2019-10-09 | Disposition: A | Discharge status: Home / Self Care | Payer: Medicaid Other | Source: Ambulatory Visit | Attending: *Deleted | Admitting: *Deleted

## 2019-10-09 DIAGNOSIS — Z1231 Encounter for screening mammogram for malignant neoplasm of breast: Secondary | ICD-10-CM | POA: Diagnosis not present

## 2019-10-10 NOTE — Telephone Encounter (Signed)
Patient states they called her

## 2019-10-16 NOTE — Progress Notes (Signed)
Patient is a 65 year old female here for follow-up after excision of left hip wound/fistula (3 x 3 x 8 cm) on 07/17/2019 with Dr. Marla Roe.  She is followed up with her orthopedist who saw no signs of loosening of the hip implant and was placed on antibiotics due to having an open wound over the area of the implant.  At last visit on 09/27/2019 the wound measured 1 x 1 cm of granulation tissue above the skin with clear drainage.  Silver nitrate was applied and the wound was packed with gauze.  Today she reports she is doing very well.  She is excited that she does not have to use any gauze  as there is no drainage coming from the site.  Wound has fully closed, c/d/i. No signs of infection, redness, drainage, seroma/hematoma.  Denies F, CP, SOB, N/V.   Pictures were obtained of the patient and placed in the chart with the patient's or guardian's permission.  Follow-up in 4 to 6 weeks.  Call office with any questions/concerns. No dressings needed and can resume normal activity as tolerated.  The [place] was signed into law in 2016 which includes the topic of electronic health records.  This provides immediate access to information in MyChart.  This includes consultation notes, operative notes, office notes, lab results and pathology reports.  If you have any questions about what you read please let us know at your next visit or call us at the office.  We are right here with you.

## 2019-10-17 ENCOUNTER — Ambulatory Visit (INDEPENDENT_AMBULATORY_CARE_PROVIDER_SITE_OTHER): Payer: Medicaid Other | Admitting: Plastic Surgery

## 2019-10-17 ENCOUNTER — Other Ambulatory Visit: Payer: Self-pay

## 2019-10-17 ENCOUNTER — Encounter: Payer: Self-pay | Admitting: Plastic Surgery

## 2019-10-17 VITALS — BP 137/84 | HR 92 | Temp 98.2°F | Ht 62.0 in | Wt 260.0 lb

## 2019-10-17 DIAGNOSIS — S71002A Unspecified open wound, left hip, initial encounter: Secondary | ICD-10-CM

## 2019-10-21 ENCOUNTER — Telehealth: Payer: Self-pay

## 2019-10-21 NOTE — Telephone Encounter (Signed)
Patient calls nurse line stating she received her johnson and johnson covid vaccine 2 weeks ago tomorrow. Patient stated her arm is still a bit sore and she has noticed a very small knot at injection site. Patient denies any other symptoms besides knot. Patient states its hard and not moveable. Patient denies fever, chills or SOB. No warmth to the touch around area. Patient doesn't know if she needs to be seen. Advised her I would forward to PCP for further instruction.

## 2019-10-22 ENCOUNTER — Other Ambulatory Visit: Payer: Self-pay

## 2019-10-22 MED ORDER — METOPROLOL TARTRATE 25 MG PO TABS: 25.0000 mg | ORAL_TABLET | Freq: Two times a day (BID) | ORAL | 3 refills | Status: DC

## 2019-10-22 MED ORDER — HYDROCHLOROTHIAZIDE 25 MG PO TABS: 25.0000 mg | ORAL_TABLET | Freq: Every day | ORAL | 0 refills | Status: DC

## 2019-10-22 MED ORDER — METFORMIN HCL 500 MG PO TABS: ORAL_TABLET | ORAL | 0 refills | Status: DC

## 2019-10-22 NOTE — Telephone Encounter (Signed)
Given the recent concern about the J&J vaccine. Please have patient schedule an in person visit for further evaluation.

## 2019-10-23 ENCOUNTER — Encounter (INDEPENDENT_AMBULATORY_CARE_PROVIDER_SITE_OTHER): Payer: Self-pay | Admitting: Bariatrics

## 2019-10-23 ENCOUNTER — Ambulatory Visit (INDEPENDENT_AMBULATORY_CARE_PROVIDER_SITE_OTHER): Payer: Medicaid Other | Admitting: Bariatrics

## 2019-10-23 ENCOUNTER — Other Ambulatory Visit: Payer: Self-pay

## 2019-10-23 VITALS — BP 147/92 | HR 128 | Temp 97.5°F | Ht 62.0 in | Wt 252.0 lb

## 2019-10-23 DIAGNOSIS — Z0289 Encounter for other administrative examinations: Secondary | ICD-10-CM

## 2019-10-23 DIAGNOSIS — E7849 Other hyperlipidemia: Secondary | ICD-10-CM | POA: Diagnosis not present

## 2019-10-23 DIAGNOSIS — Z1331 Encounter for screening for depression: Secondary | ICD-10-CM | POA: Diagnosis not present

## 2019-10-23 DIAGNOSIS — I10 Essential (primary) hypertension: Secondary | ICD-10-CM

## 2019-10-23 DIAGNOSIS — R0602 Shortness of breath: Secondary | ICD-10-CM

## 2019-10-23 DIAGNOSIS — Z6841 Body Mass Index (BMI) 40.0 and over, adult: Secondary | ICD-10-CM | POA: Diagnosis not present

## 2019-10-23 DIAGNOSIS — R5383 Other fatigue: Secondary | ICD-10-CM | POA: Diagnosis not present

## 2019-10-23 DIAGNOSIS — R7303 Prediabetes: Secondary | ICD-10-CM

## 2019-10-23 NOTE — Progress Notes (Signed)
Chief Complaint:   OBESITY Kristin [name] (MR# [id number]) is a 64 y.o. female who presents for evaluation and treatment of obesity and related comorbidities. Current BMI is Body mass index is 46.09 kg/m.Marland Kitchen Kristin [name] has been struggling with her weight for many years and has been unsuccessful in either losing weight, maintaining weight loss, or reaching her healthy weight goal.  Kristin [name] is currently in the action stage of change and ready to dedicate time achieving and maintaining a healthier weight. Kristin [name] is interested in becoming our patient and working on intensive lifestyle modifications including (but not limited to) diet and exercise for weight loss.  Kristin [name] likes to [employer], but standing can be a problem for her. She craves sweets. She skips breakfast. She has had a hip replacement and says it hinders her exercise. She walks with a cane. She does not think she stress eats.  Kristin [name]'s habits were reviewed today and are as follows: Her family sometimes eats meals together, her desired weight loss is 52 lbs, she has been heavy most of her life, she started gaining weight after her 3rd child, her heaviest weight ever was 252 pounds, she craves sweets, she snacks frequently in the evenings, she skips breakfast frequently, she is frequently drinking liquids with calories and she struggles with emotional eating.  Depression Screen Robbie's Food and Mood (modified PHQ-9) score was 14.  Depression screen PHQ 2/9 10/23/2019  Decreased Interest 1  Down, Depressed, Hopeless 2  PHQ - 2 Score 3  Altered sleeping 1  Tired, decreased energy 3  Change in appetite 2  Feeling bad or failure about yourself  1  Trouble concentrating 1  Moving slowly or fidgety/restless 3  Suicidal thoughts 0  PHQ-9 Score 14  Difficult doing work/chores Somewhat difficult  Some recent data might be hidden   Subjective:   Other fatigue. Kristin [name] admits to daytime somnolence. Patent has a history of symptoms of  daytime fatigue and morning headache. Kristin [name] generally gets 8 hours of sleep per night, and states that she sometimes has restful sleep. Snoring is present. Apneic episodes are not present. Epworth Sleepiness Score is 7.  Shortness of breath on exertion. Kristin [name] notes increasing shortness of breath with certain activities and seems to be worsening over time with weight gain. She notes getting out of breath sooner with activity than she used to. This has gotten worse recently. Kristin [name] denies shortness of breath at rest or orthopnea.  Essential hypertension. Kristin [name] is taking metoprolol and HCTZ. She did not take her medication this morning. Blood pressure is slightly elevated.  BP Readings from Last 3 Encounters:  10/23/19 (!) 147/92  10/17/19 137/84  09/27/19 137/86   Lab Results  Component Value Date   CREATININE 0.98 07/11/2019   CREATININE 0.95 01/22/2018   CREATININE 1.09 (H) 01/20/2018   Prediabetes. Kristin [name] has a diagnosis of prediabetes based on her elevated HgA1c and was informed this puts her at greater risk of developing diabetes. She continues to work on diet and exercise to decrease her risk of diabetes. She denies nausea or hypoglycemia. Kristin [name] is taking metformin.  Lab Results  Component Value Date   HGBA1C 6.9 02/06/2018   No results found for: INSULIN  Other hyperlipidemia. Kristin [name] is taking simvastatin.   Lab Results  Component Value Date   CHOL 132 04/17/2017   HDL 49 04/17/2017   LDLCALC 61 04/17/2017   TRIG 109 04/17/2017   CHOLHDL 2.7 04/17/2017   Lab Results  Component Value Date  ALT 15 01/19/2018   AST 18 01/19/2018   ALKPHOS 143 (H) 01/19/2018   BILITOT 0.5 01/19/2018   The 10-year ASCVD risk score Kristin [name] DC Jr., et al., 2013) is: 18.1%   Values used to calculate the score:     Age: 80 years     Sex: Female     Is Non-Hispanic African American: Yes     Diabetic: Yes     Tobacco smoker: No     Systolic Blood Pressure: [id number] mmHg     Is BP  treated: Yes     HDL Cholesterol: 49 mg/dL     Total Cholesterol: 132 mg/dL  Depression screening. [name] had a moderately positive depression screen with a PHQ-9 score of 14.  Assessment/Plan:   Other fatigue. [name] does feel that her weight is causing her energy to be lower than it should be. Fatigue may be related to obesity, depression or many other causes. Labs will be ordered, and in the meanwhile, [name] will focus on self care including making healthy food choices, increasing physical activity and focusing on stress reduction. EKG 12-Lead, CBC with Differential/Platelet, T3, T4, free, TSH, VITAMIN D 25 Hydroxy (Vit-D Deficiency, Fractures) testing ordered today.  Shortness of breath on exertion. [name] does feel that she gets out of breath more easily that she used to when she exercises. [name]'s shortness of breath appears to be obesity related and exercise induced. She has agreed to work on weight loss and gradually increase exercise to treat her exercise induced shortness of breath. Will continue to monitor closely. Lipid Panel With LDL/HDL Ratio labs ordered today.  Essential hypertension. Kristin [name] is working on healthy weight loss and exercise to improve blood pressure control. We will watch for signs of hypotension as she continues her lifestyle modifications. She will continue her medications and take daily as directed.  Prediabetes. Kyan will continue to work on weight loss, exercise, and decreasing simple carbohydrates to help decrease the risk of diabetes. Comprehensive metabolic panel, Insulin, random, Hemoglobin A1c labs ordered today.  Other hyperlipidemia. Cardiovascular risk and specific lipid/LDL goals reviewed.  We discussed several lifestyle modifications today and Azile will continue to work on diet, exercise and weight loss efforts. Orders and follow up as documented in patient record. She will continue her medication as directed. Lipid Panel With LDL/HDL Ratio  labs ordered today.  Counseling Intensive lifestyle modifications are the first line treatment for this issue. . Dietary changes: Increase soluble fiber. Decrease simple carbohydrates. . Exercise changes: Moderate to vigorous-intensity aerobic activity 150 minutes per week if tolerated. . Lipid-lowering medications: see documented in medical record.    Depression screening. Kristin [name] had a positive depression screening. Depression is commonly associated with obesity and often results in emotional eating behaviors. We will monitor this closely and work on CBT to help improve the non-hunger eating patterns. Referral to Psychology may be required if no improvement is seen as she continues in our clinic.  Class 3 severe obesity with serious comorbidity and body mass index (BMI) of 45.0 to 49.9 in adult, unspecified obesity type (Kristin [name]).  Kristin [name] is currently in the action stage of change and her goal is to continue with weight loss efforts. I recommend Kristin [name] begin the structured treatment plan as follows:  She has agreed to the Category 2 Plan + 100 calories.  She will work on meal planning and will stop sugary beverages.  We reviewed with the patient labs from 12/26/2018 including CMP, lipids, Vitamin D, CBC, and A1c.  Exercise  goals: All adults should avoid inactivity. Some physical activity is better than none, and adults who participate in any amount of physical activity gain some health benefits.   Behavioral modification strategies: increasing lean protein intake, decreasing simple carbohydrates, increasing vegetables, increasing water intake, decreasing eating out, no skipping meals, meal planning and cooking strategies and keeping healthy foods in the home.  She was informed of the importance of frequent follow-up visits to maximize her success with intensive lifestyle modifications for her multiple health conditions. She was informed we would discuss her lab results at her next visit  unless there is a critical issue that needs to be addressed sooner. Kristin [name] agreed to keep her next visit at the agreed upon time to discuss these results.  Objective:   Blood pressure (!) 147/92, pulse (!) 128, temperature (!) 97.5 F (36.4 C), height 5\' 2"  (1.575 m), weight 252 lb (114.3 kg), SpO2 98 %. Body mass index is 46.09 kg/m.  EKG: Sinus  Tachycardia with a rate of 125 BPM. Old anterior infarct. Diffuse ST depression - Nondiagnostic. Abnormal.   Indirect Calorimeter completed today shows a VO2 of 242 and a REE of 1682.  Her calculated basal metabolic rate is [id number] thus her basal metabolic rate is worse than expected.  General: Cooperative, alert, well developed, in no acute distress. HEENT: Conjunctivae and lids unremarkable. Cardiovascular: Regular rhythm.  Lungs: Normal work of breathing. Neurologic: No focal deficits.   Lab Results  Component Value Date   CREATININE 0.98 07/11/2019   BUN 15 07/11/2019   NA 141 07/11/2019   K 4.3 07/11/2019   CL 101 07/11/2019   CO2 29 07/11/2019   Lab Results  Component Value Date   ALT 15 01/19/2018   AST 18 01/19/2018   ALKPHOS 143 (H) 01/19/2018   BILITOT 0.5 01/19/2018   Lab Results  Component Value Date   HGBA1C 6.9 02/06/2018   HGBA1C 6.7 04/14/2017   HGBA1C 5.8 09/08/2016   HGBA1C 6.5 05/03/2016   HGBA1C 6.4 01/07/2016   No results found for: INSULIN Lab Results  Component Value Date   TSH 0.652 08/04/2017   Lab Results  Component Value Date   CHOL 132 04/17/2017   HDL 49 04/17/2017   LDLCALC 61 04/17/2017   TRIG 109 04/17/2017   CHOLHDL 2.7 04/17/2017   Lab Results  Component Value Date   WBC 5.1 02/06/2018   HGB 10.5 (L) 02/06/2018   HCT 33.2 (L) 02/06/2018   MCV 89 02/06/2018   PLT 329 02/06/2018   Lab Results  Component Value Date   FERRITIN 166 (H) 02/06/2018   Attestation Statements:   Reviewed by clinician on day of visit: allergies, medications, problem list, medical history, surgical  history, family history, social history, and previous encounter notes.  Time spent on visit including pre-visit chart review and post-visit charting and care was 45 minutes.   Migdalia Dk, am acting as Location manager for [employer], DO   I have reviewed the above documentation for accuracy and completeness, and I agree with the above. Jearld Lesch, DO

## 2019-10-24 ENCOUNTER — Encounter (INDEPENDENT_AMBULATORY_CARE_PROVIDER_SITE_OTHER): Payer: Self-pay | Admitting: Bariatrics

## 2019-10-24 ENCOUNTER — Other Ambulatory Visit: Payer: Self-pay | Admitting: *Deleted

## 2019-10-24 LAB — COMPREHENSIVE METABOLIC PANEL
ALT: 14 IU/L (ref 0–32)
AST: 13 IU/L (ref 0–40)
Albumin/Globulin Ratio: 1.8 (ref 1.2–2.2)
Albumin: 5 g/dL — ABNORMAL HIGH (ref 3.8–4.8)
Alkaline Phosphatase: 141 IU/L — ABNORMAL HIGH (ref 39–117)
BUN/Creatinine Ratio: 10 — ABNORMAL LOW (ref 12–28)
BUN: 11 mg/dL (ref 8–27)
Bilirubin Total: 0.3 mg/dL (ref 0.0–1.2)
CO2: 25 mmol/L (ref 20–29)
Calcium: 10.6 mg/dL — ABNORMAL HIGH (ref 8.7–10.3)
Chloride: 96 mmol/L (ref 96–106)
Creatinine, Ser: 1.11 mg/dL — ABNORMAL HIGH (ref 0.57–1.00)
GFR calc Af Amer: 62 mL/min/{1.73_m2} (ref >59)
GFR calc non Af Amer: 53 mL/min/{1.73_m2} — ABNORMAL LOW (ref >59)
Globulin, Total: 2.8 g/dL (ref 1.5–4.5)
Glucose: 98 mg/dL (ref 65–99)
Potassium: 4.5 mmol/L (ref 3.5–5.2)
Sodium: 139 mmol/L (ref 134–144)
Total Protein: 7.8 g/dL (ref 6.0–8.5)

## 2019-10-24 LAB — CBC WITH DIFFERENTIAL/PLATELET
Basophils Absolute: 0 10*3/uL (ref 0.0–0.2)
Basos: 1 %
EOS (ABSOLUTE): 0.2 10*3/uL (ref 0.0–0.4)
Eos: 5 %
Hematocrit: 34.5 % (ref 34.0–46.6)
Hemoglobin: 11.2 g/dL (ref 11.1–15.9)
Immature Grans (Abs): 0 10*3/uL (ref 0.0–0.1)
Immature Granulocytes: 0 %
Lymphocytes Absolute: 1.5 10*3/uL (ref 0.7–3.1)
Lymphs: 33 %
MCH: 28.1 pg (ref 26.6–33.0)
MCHC: 32.5 g/dL (ref 31.5–35.7)
MCV: 87 fL (ref 79–97)
Monocytes Absolute: 0.4 10*3/uL (ref 0.1–0.9)
Monocytes: 8 %
Neutrophils Absolute: 2.4 10*3/uL (ref 1.4–7.0)
Neutrophils: 53 %
Platelets: 339 10*3/uL (ref 150–450)
RBC: 3.98 x10E6/uL (ref 3.77–5.28)
RDW: 14.2 % (ref 11.7–15.4)
WBC: 4.5 10*3/uL (ref 3.4–10.8)

## 2019-10-24 LAB — T3: T3, Total: 102 ng/dL (ref 71–180)

## 2019-10-24 LAB — VITAMIN D 25 HYDROXY (VIT D DEFICIENCY, FRACTURES): Vit D, 25-Hydroxy: 41 ng/mL (ref 30.0–100.0)

## 2019-10-24 LAB — LIPID PANEL WITH LDL/HDL RATIO
Cholesterol, Total: 148 mg/dL (ref 100–199)
HDL: 50 mg/dL (ref >39)
LDL Chol Calc (NIH): 76 mg/dL (ref 0–99)
LDL/HDL Ratio: 1.5 ratio (ref 0.0–3.2)
Triglycerides: 123 mg/dL (ref 0–149)
VLDL Cholesterol Cal: 22 mg/dL (ref 5–40)

## 2019-10-24 LAB — INSULIN, RANDOM: INSULIN: 13.1 u[IU]/mL (ref 2.6–24.9)

## 2019-10-24 LAB — HEMOGLOBIN A1C
Est. average glucose Bld gHb Est-mCnc: 134 mg/dL
Hgb A1c MFr Bld: 6.3 % — ABNORMAL HIGH (ref 4.8–5.6)

## 2019-10-24 LAB — TSH: TSH: 0.943 u[IU]/mL (ref 0.450–4.500)

## 2019-10-24 LAB — T4, FREE: Free T4: 1.11 ng/dL (ref 0.82–1.77)

## 2019-10-30 ENCOUNTER — Encounter: Payer: Self-pay | Admitting: Family Medicine

## 2019-10-30 ENCOUNTER — Other Ambulatory Visit: Payer: Self-pay

## 2019-10-30 ENCOUNTER — Ambulatory Visit (INDEPENDENT_AMBULATORY_CARE_PROVIDER_SITE_OTHER): Payer: Medicaid Other | Admitting: Family Medicine

## 2019-10-30 VITALS — BP 124/80 | HR 94 | Ht 62.0 in | Wt 257.1 lb

## 2019-10-30 DIAGNOSIS — E119 Type 2 diabetes mellitus without complications: Secondary | ICD-10-CM | POA: Diagnosis not present

## 2019-10-30 DIAGNOSIS — L299 Pruritus, unspecified: Secondary | ICD-10-CM

## 2019-10-30 DIAGNOSIS — Z23 Encounter for immunization: Secondary | ICD-10-CM | POA: Diagnosis not present

## 2019-10-30 LAB — POCT UA - MICROALBUMIN
Albumin/Creatinine Ratio, Urine, POC: <30
Creatinine, POC: 200 mg/dL
Microalbumin Ur, POC: 30 mg/L

## 2019-10-30 MED ORDER — LEVOCETIRIZINE DIHYDROCHLORIDE 5 MG PO TABS: 5.0000 mg | ORAL_TABLET | Freq: Every evening | ORAL | 1 refills | Status: DC

## 2019-10-30 MED ORDER — ZIKS ARTHRITIS PAIN RELIEF 0.025-1-12 % EX CREA: 1.0000 "application " | TOPICAL_CREAM | Freq: Four times a day (QID) | CUTANEOUS | 1 refills | Status: DC | PRN

## 2019-10-30 NOTE — Patient Instructions (Signed)
Pruritus Pruritus is an itchy feeling on the skin. One of the most common causes is dry skin, but many different things can cause itching. Most cases of itching do not require medical attention. Sometimes itchy skin can turn into a rash. Follow these instructions at home: Skin care   Apply moisturizing lotion to your skin as needed. Lotion that contains petroleum jelly is best.  Take medicines or apply medicated creams only as told by your health care provider. This may include: ? Corticosteroid cream. ? Anti-itch lotions. ? Oral antihistamines.  Apply a cool, wet cloth (cool compress) to the affected areas.  Take baths with one of the following: ? Epsom salts. You can get these at your local pharmacy or grocery store. Follow the instructions on the packaging. ? Baking soda. Pour a small amount into the bath as told by your health care provider. ? Colloidal oatmeal. You can get this at your local pharmacy or grocery store. Follow the instructions on the packaging.  Apply baking soda paste to your skin. To make the paste, stir water into a small amount of baking soda until it reaches a paste-like consistency.  Do not scratch your skin.  Do not take hot showers or baths, which can make itching worse. A cool shower may help with itching as long as you apply moisturizing lotion after the shower.  Do not use scented soaps, detergents, perfumes, and cosmetic products. Instead, use gentle, unscented versions of these items. General instructions  Avoid wearing tight clothes.  Keep a journal to help find out what is causing your itching. Write down: ? What you eat and drink. ? What cosmetic products you use. ? What soaps or detergents you use. ? What you wear, including jewelry.  Use a humidifier. This keeps the air moist, which helps to prevent dry skin.  Be aware of any changes in your itchiness. Contact a health care provider if:  The itching does not go away after several  days.  You are unusually thirsty or urinating more than normal.  Your skin tingles or feels numb.  Your skin or the white parts of your eyes turn yellow (jaundice).  You feel weak.  You have any of the following: ? Night sweats. ? Tiredness (fatigue). ? Weight loss. ? Abdominal pain. Summary  Pruritus is an itchy feeling on the skin. One of the most common causes is dry skin, but many different conditions and factors can cause itching.  Apply moisturizing lotion to your skin as needed. Lotion that contains petroleum jelly is best.  Take medicines or apply medicated creams only as told by your health care provider.  Do not take hot showers or baths. Do not use scented soaps, detergents, perfumes, or cosmetic products. This information is not intended to replace advice given to you by your health care provider. Make sure you discuss any questions you have with your health care provider. Document Revised: 07/04/2017 Document Reviewed: 07/04/2017 Elsevier Patient Education  2020 [employer] Inc[employer]  

## 2019-10-30 NOTE — Assessment & Plan Note (Signed)
Recent lab work-up was within normal limits including TSH, LFTs, creatinine, urea.  Mildly elevated calcium possibly significant as sarcoidosis could present like this. -Levocetirizine -Capsaicin -If no improvement 2 weeks, consider chest x-ray to look for hilar lymphadenopathy

## 2019-10-30 NOTE — Progress Notes (Signed)
    SUBJECTIVE:   CHIEF COMPLAINT / HPI:   Pruritus Patient endorses itching for 3 weeks.  She denies having any rash.  It is getting worse.  The itching has moved from multiple places.  Previously was on her right thigh now it is on both knees and her left medial buttock.  Patient also has chronic wound followed by plastic surgery.  The wound does itch as well.  Patient denies any new medications.  Patient has been on Bactrim for the past 4 months for her wound.  She denies any fevers or chills.  For the itching she has tried topical hydrocortisone but has not had any improvement.  Patient recently seen by bariatric medicine and had thorough lab work done including TSH, CMP, TSH was normal.  BUN was normal.  Transaminases were normal.  Patient has mild elevated alk phos but this has been elevated in the past for many years.  Also has mildly elevated calcium.  Denies fevers or chills.  Does endorse getting the Covid vaccine about 3 weeks ago, but says the itching was occurring before she received the vaccine.  Patient does not endorse any new medications.  PERTINENT  PMH / PSH:  Patient denies family or personal history of sarcoidosis   OBJECTIVE:   BP 124/80   Pulse 94   Ht '5\' 2"'$  (1.575 m)   Wt 257 lb 2 oz (116.6 kg)   SpO2 97%   BMI 47.03 kg/m   General: No acute distress Skin: There exam of abdomen, back, neck, extremities, buttock.  No rash, no excoriation, no eczematous lesions  ASSESSMENT/PLAN:   Pruritus Recent lab work-up was within normal limits including TSH, LFTs, creatinine, urea.  Mildly elevated calcium possibly significant as sarcoidosis could present like this. -Levocetirizine -Capsaicin -If no improvement 2 weeks, consider chest x-ray to look for hilar lymphadenopathy     Bonnita Hollow, MD [facility]

## 2019-11-04 ENCOUNTER — Telehealth: Payer: Self-pay | Admitting: Plastic Surgery

## 2019-11-04 NOTE — Telephone Encounter (Signed)
Patient called to advise that the bubble has come back up on her hip and it's bleeding and has pus coming out. It's not red or tender in that area. Please call to advise.

## 2019-11-05 ENCOUNTER — Ambulatory Visit (INDEPENDENT_AMBULATORY_CARE_PROVIDER_SITE_OTHER): Payer: Medicaid Other | Admitting: Surgical

## 2019-11-05 ENCOUNTER — Other Ambulatory Visit: Payer: Self-pay

## 2019-11-05 ENCOUNTER — Telehealth: Payer: Self-pay

## 2019-11-05 ENCOUNTER — Encounter: Payer: Self-pay | Admitting: Surgical

## 2019-11-05 VITALS — BP 148/87 | HR 99 | Temp 98.4°F | Ht 62.0 in | Wt 260.0 lb

## 2019-11-05 DIAGNOSIS — S81802S Unspecified open wound, left lower leg, sequela: Secondary | ICD-10-CM | POA: Diagnosis not present

## 2019-11-05 DIAGNOSIS — S71002D Unspecified open wound, left hip, subsequent encounter: Secondary | ICD-10-CM

## 2019-11-05 DIAGNOSIS — Z96642 Presence of left artificial hip joint: Secondary | ICD-10-CM | POA: Diagnosis not present

## 2019-11-05 NOTE — Progress Notes (Signed)
Subjective:     Patient ID: Kristin [name], female    DOB: March 15, 1956, 64 y.o.   MRN: [id number]  Chief Complaint  Patient presents with  . Follow-up    HPI: The patient is a 64 y.o. female here for follow-up on her left leg wound.  Patient has history of a left hip replacement in 2017 by Dr. Fayrene Fearing.  Postoperatively patient had a lot of difficulty with healing and had to have multiple revisions of her soft tissue.  She was first evaluated in this office on 06/11/2019 by Dr. Ulice Bold.  Patient underwent excision of left leg wound and placement of ACell and primary closure on 07/17/2019 with Dr. Ulice Bold.   Patient had CT abdomen pelvis with contrast on 08/09/2019 showing findings within the left hip arthroplasty consistent with chronic inflammation/infection or loosening.  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 It has previously been noted that patient's joint is not having signs of loosening.  Patient has been following up with our office over the past few months for management of left leg wound.  She has also been following with orthopedic team.  On 10/08/2019 patient was placed on 6 weeks of Bactrim by Ortho.  Patient reports today that she intermittently will get swelling of her left hip and notice what feels like a fluid collection to the left of her hip incision.  After the swelling and fluid collection she notices that it drains.  This has happened multiple times and most recently about a week ago which is when she developed this new wound of her left hip incision.  She is feeling fine, no fevers or chills or nausea or vomiting.  No other complaints  Review of Systems  All other systems reviewed and are negative.  Objective:   Vital Signs BP (!) 148/87 (BP Location: Left Arm, Patient Position: Sitting, Cuff Size: Large)   Pulse 99   Temp 98.4 F (36.9 C) (Temporal)   Ht 5\' 2"  (1.575 m)    Wt 260 lb (117.9 kg)   SpO2 98%   BMI 47.55 kg/m  Vital Signs and Nursing Note Reviewed Chaperone present Physical Exam  Constitutional: She is oriented to person, place, and time and well-developed, well-nourished, and in no distress.  Pulmonary/Chest: Effort normal.  Musculoskeletal:       Legs:  Neurological: She is alert and oriented to person, place, and time. Gait normal.  Skin: Skin is warm and dry. She is not diaphoretic. No erythema.  Psychiatric: Mood and affect normal.    Assessment/Plan:     ICD-10-CM   1. Unspecified open wound, left hip, subsequent encounter  S71.002D   2. Status post left hip replacement  Z96.642   3. Wound of left leg, sequela  S81.802S     Silver nitrate used to cauterize left hip wound, it was slightly hyper granulated. The wound appears to be tracking approximately 0.6 cm.  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  She is on chronic antibiotics prescribed by orthopedic team.  She reports her next follow-up with them is in 4 months, I recommend patient call orthopedic team to inform them that she had developed an additional wound.  Recommend daily dressing changes with small amount of K-Y jelly, collagen, 4x4 gauze, tape.  Patient has a follow-up in 2 weeks for reevaluation. Unfortunately patient has had  this wound recur continuously over the past 3 years, she reports that orthopedic team has noted that she may need lifelong antibiotics.  Pictures were obtained of the patient and placed in the chart with the patient's or guardian's permission.  Kermit Balo Sione Baumgarten, PA-C 11/05/2019, 10:13 AM

## 2019-11-05 NOTE — Telephone Encounter (Signed)
Orders for wound dressing supplies faxed to [facility] per [place], [place] Copy scanned into chart

## 2019-11-06 ENCOUNTER — Ambulatory Visit (INDEPENDENT_AMBULATORY_CARE_PROVIDER_SITE_OTHER): Payer: Medicaid Other | Admitting: Bariatrics

## 2019-11-06 ENCOUNTER — Encounter (INDEPENDENT_AMBULATORY_CARE_PROVIDER_SITE_OTHER): Payer: Self-pay | Admitting: Bariatrics

## 2019-11-06 DIAGNOSIS — R7303 Prediabetes: Secondary | ICD-10-CM

## 2019-11-06 DIAGNOSIS — I10 Essential (primary) hypertension: Secondary | ICD-10-CM

## 2019-11-06 DIAGNOSIS — Z6841 Body Mass Index (BMI) 40.0 and over, adult: Secondary | ICD-10-CM

## 2019-11-06 NOTE — Progress Notes (Signed)
Chief Complaint:   OBESITY HIILEI NED is here to discuss her progress with her obesity treatment plan along with follow-up of her obesity related diagnoses. Skylla is on the Category 2 Plan + 100 calories and states she is following her eating plan approximately 90% of the time. Adreana states she is exercising 0 minutes 0 times per week.  Today's visit was #: 2 Starting weight: 252 lbs Starting date: 10/23/2019 Today's weight: 246 lbs Today's date: 11/06/2019 Total lbs lost to date: 6 Total lbs lost since last in-office visit: 6  Interim History: Shivaun is down 6 lbs. She does have trouble eating breakfast and lunch because she has been a "skipper" in the past.  Subjective:   Hypercalcemia. Raeya is taking HCTZ and Vitamin D. Last Vitamin D 41.0 on 10/23/2019 with an elevated calcium of 10.6.  Prediabetes. Dee has a diagnosis of prediabetes based on her elevated HgA1c and was informed this puts her at greater risk of developing diabetes. She continues to work on diet and exercise to decrease her risk of diabetes. She denies nausea or hypoglycemia. Taniqua is on metformin.  Lab Results  Component Value Date   HGBA1C 6.3 (H) 10/23/2019   Lab Results  Component Value Date   INSULIN 13.1 10/23/2019   Essential hypertension. Perrie is taking HCTZ and metoprolol. Blood pressure is controlled.  BP Readings from Last 3 Encounters:  11/06/19 120/78  11/05/19 (!) 148/87  10/30/19 124/80   Lab Results  Component Value Date   CREATININE 1.11 (H) 10/23/2019   CREATININE 0.98 07/11/2019   CREATININE 0.95 01/22/2018   Assessment/Plan:   Hypercalcemia. Will recheck calcium in 6-8 weeks. Will consider changing to another medication for blood pressure if calcium remains high.  Prediabetes. Ibbie will continue to work on weight loss, exercise, and decreasing simple carbohydrates to help decrease the risk of diabetes. She will continue metformin as  directed.  Essential hypertension. Jandra is working on healthy weight loss and exercise to improve blood pressure control. We will watch for signs of hypotension as she continues her lifestyle modifications. She will continue her medications as directed.  Class 3 severe obesity with serious comorbidity and body mass index (BMI) of 45.0 to 49.9 in adult, unspecified obesity type ([place]).  Tanny is currently in the action stage of change. As such, her goal is to continue with weight loss efforts. She has agreed to the Category 2 Plan + 100 calories.   She will work on meal planning and will save snacks for evening snacking.  We reviewed with the patient labs from 10/23/2019 including CMP, lipids, Vitamin DF, CBC, A1c, insulin, and thyroid panel.  Exercise goals: All adults should avoid inactivity. Some physical activity is better than none, and adults who participate in any amount of physical activity gain some health benefits.  Behavioral modification strategies: increasing lean protein intake, decreasing simple carbohydrates, increasing vegetables, increasing water intake, decreasing eating out, no skipping meals, meal planning and cooking strategies, keeping healthy foods in the home, dealing with family or coworker sabotage, travel eating strategies, holiday eating strategies  and celebration eating strategies.  Shatisha has agreed to follow-up with our clinic in 2 weeks. She was informed of the importance of frequent follow-up visits to maximize her success with intensive lifestyle modifications for her multiple health conditions.   Objective:   Blood pressure 120/78, pulse 90, temperature 98.2 F (36.8 C), height 5\' 2"  (1.575 m), weight 246 lb (111.6 kg), SpO2 99 %. Body  mass index is 44.99 kg/m.  General: Cooperative, alert, well developed, in no acute distress. HEENT: Conjunctivae and lids unremarkable. Cardiovascular: Regular rhythm.  Lungs: Normal work of breathing. Neurologic:  No focal deficits. Uses a cane for ambulation.  Lab Results  Component Value Date   CREATININE 1.11 (H) 10/23/2019   BUN 11 10/23/2019   NA 139 10/23/2019   K 4.5 10/23/2019   CL 96 10/23/2019   CO2 25 10/23/2019   Lab Results  Component Value Date   ALT 14 10/23/2019   AST 13 10/23/2019   ALKPHOS 141 (H) 10/23/2019   BILITOT 0.3 10/23/2019   Lab Results  Component Value Date   HGBA1C 6.3 (H) 10/23/2019   HGBA1C 6.9 02/06/2018   HGBA1C 6.7 04/14/2017   HGBA1C 5.8 09/08/2016   HGBA1C 6.5 05/03/2016   Lab Results  Component Value Date   INSULIN 13.1 10/23/2019   Lab Results  Component Value Date   TSH 0.943 10/23/2019   Lab Results  Component Value Date   CHOL 148 10/23/2019   HDL 50 10/23/2019   LDLCALC 76 10/23/2019   TRIG 123 10/23/2019   CHOLHDL 2.7 04/17/2017   Lab Results  Component Value Date   WBC 4.5 10/23/2019   HGB 11.2 10/23/2019   HCT 34.5 10/23/2019   MCV 87 10/23/2019   PLT 339 10/23/2019   Lab Results  Component Value Date   FERRITIN 166 (H) 02/06/2018   Attestation Statements:   Reviewed by clinician on day of visit: allergies, medications, problem list, medical history, surgical history, family history, social history, and previous encounter notes.  Time spent on visit including pre-visit chart review and post-visit charting and care was 30 minutes.   Migdalia Dk, am acting as Location manager for [employer], DO   I have reviewed the above documentation for accuracy and completeness, and I agree with the above. Jearld Lesch, DO

## 2019-11-07 ENCOUNTER — Telehealth: Payer: Self-pay | Admitting: Orthopaedic Surgery

## 2019-11-07 NOTE — Telephone Encounter (Signed)
Next Tuesday.  Thanks.

## 2019-11-07 NOTE — Telephone Encounter (Signed)
Patient called.   She had surgery done by Erlinda Hong about three years ago but was recently seen by her Psychiatric nurse. They informed her that the bleeding and swelling around her incision should probably be looked at.   She would like a call back to discuss her next step and assess the urgency.   Call back: [phone]

## 2019-11-07 NOTE — Telephone Encounter (Signed)
Would you like her to come in tomorrow or next week? What is the next step?

## 2019-11-08 NOTE — Telephone Encounter (Signed)
Patient appt made.  

## 2019-11-11 ENCOUNTER — Telehealth: Payer: Self-pay | Admitting: *Deleted

## 2019-11-11 ENCOUNTER — Telehealth: Payer: Self-pay | Admitting: Orthopaedic Surgery

## 2019-11-11 ENCOUNTER — Encounter (INDEPENDENT_AMBULATORY_CARE_PROVIDER_SITE_OTHER): Payer: Self-pay | Admitting: Bariatrics

## 2019-11-11 DIAGNOSIS — F411 Generalized anxiety disorder: Secondary | ICD-10-CM | POA: Diagnosis not present

## 2019-11-11 NOTE — Telephone Encounter (Signed)
Patient calls because she was exposed to Covid yesterday and she wants to know her next steps.  She did receive the [employer] shot roughly 3 weeks ago.  She is currently asymptomatic and wants to know if she should get tested.   She will quarantine until she hears back from PCP. Christen Bame, CMA

## 2019-11-11 NOTE — Telephone Encounter (Signed)
Okay to keep appt if no symptoms.

## 2019-11-11 NOTE — Telephone Encounter (Signed)
ok 

## 2019-11-11 NOTE — Telephone Encounter (Signed)
Patient called stating that she was exposed to [place] [employer] over the weekend.  She has been vaccinated, but wanted to know if she needed to get tested before coming to her appointment.  CB#[phone].  Thank you.

## 2019-11-12 ENCOUNTER — Encounter: Payer: Self-pay | Admitting: Orthopaedic Surgery

## 2019-11-12 ENCOUNTER — Ambulatory Visit: Payer: Self-pay

## 2019-11-12 ENCOUNTER — Other Ambulatory Visit: Payer: Self-pay

## 2019-11-12 ENCOUNTER — Ambulatory Visit (INDEPENDENT_AMBULATORY_CARE_PROVIDER_SITE_OTHER): Payer: Medicaid Other | Admitting: Orthopaedic Surgery

## 2019-11-12 DIAGNOSIS — Z96642 Presence of left artificial hip joint: Secondary | ICD-10-CM | POA: Diagnosis not present

## 2019-11-12 DIAGNOSIS — S81802S Unspecified open wound, left lower leg, sequela: Secondary | ICD-10-CM | POA: Diagnosis not present

## 2019-11-12 NOTE — Telephone Encounter (Signed)
Since patient was vaccinated. Patient only needs to get tested if she has symptoms. Otherwise, she can return from quarantine.

## 2019-11-12 NOTE — Progress Notes (Signed)
Office Visit Note   Patient: Kristin [name]           Date of Birth: [birth year]           MRN: [id number] Visit Date: 11/12/2019              Requested by: Bonnita Hollow, MD [address],   [id number] PCP: Bonnita Hollow, MD   Assessment & Plan: Visit Diagnoses:  1. Wound of left leg, sequela   2. Presence of left artificial hip joint     Plan: Impression is chronically infected left total hip replacement.  I have reviewed all of the studies and based on the findings she has a very high likelihood of chronic infection of her prosthetic hip.  Based on these findings and our discussion I have recommended a two-stage hip revision.  Risks and benefits rehab recovery all reviewed in detail.  She understands that she is at risk for chronic infection that may cause sepsis even with revision surgery.  I will be in touch with her personally to talk about timing of surgery.  Questions encouraged and answered.  Follow-Up Instructions: Return if symptoms worsen or fail to improve.   Orders:  Orders Placed This Encounter  Procedures  . XR HIP UNILAT W OR W/O PELVIS 2-3 VIEWS LEFT   No orders of the defined types were placed in this encounter.     Procedures: No procedures performed   Clinical Data: No additional findings.   Subjective: Chief Complaint  Patient presents with  . Left Hip - Pain, Drainage from Incision    [name] presents today for a chronic draining hip wound.  This has been occurring periodically.  Dr. Elisabeth Cara of plastic surgery has explored this wound and she has try to get it to heal.  She denies any pain with ambulation or any constitutional symptoms.   Review of Systems  Constitutional: Negative.   HENT: Negative.   Eyes: Negative.   Respiratory: Negative.   Cardiovascular: Negative.   Endocrine: Negative.   Musculoskeletal: Negative.   Neurological: Negative.   Hematological: Negative.   Psychiatric/Behavioral:  Negative.   All other systems reviewed and are negative.    Objective: Vital Signs: There were no vitals taken for this visit.  Physical Exam Vitals and nursing note reviewed.  Constitutional:      Appearance: She is well-developed.  Pulmonary:     Effort: Pulmonary effort is normal.  Skin:    General: Skin is warm.     Capillary Refill: Capillary refill takes less than 2 seconds.  Neurological:     Mental Status: She is alert and oriented to person, place, and time.  Psychiatric:        Behavior: Behavior normal.        Thought Content: Thought content normal.        Judgment: Judgment normal.     Ortho Exam Left hip shows a chronic with pea-sized wound on the anterior aspect of the surgical scar.  There is clear drainage coming out of the wound.  She has no significant pain with movement of the hip. Specialty Comments:  No specialty comments available.  Imaging: XR HIP UNILAT W OR W/O PELVIS 2-3 VIEWS LEFT  Result Date: 11/12/2019 Lucency surrounding the acetabular and femoral component concerning for infection.  There is no gross evidence of hardware failure    PMFS History: Patient Active Problem List   Diagnosis Date Noted  . Pruritus 10/30/2019  .  Hypercalcemia 10/24/2019  . Body mass index 45.0-49.9, adult ([place]) 08/27/2019  . Intertrigo 07/09/2019  . Facial rash 07/27/2018  . Body mass index 40.0-44.9, adult ([place]) 02/15/2018  . Fatigue associated with anemia 02/07/2018  . Unspecified open wound, left hip, initial encounter 01/19/2018  . Wound of left leg, sequela 10/03/2016  . 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 Status post left hip replacement 06/01/2016  . Sinus tachycardia 05/19/2016  . History of adenomatous polyp of colon 03/21/2016  . Diabetes ([place]) 01/21/2016  . Thyroid nodule 12/25/2014  . Infiltrate noted on imaging study   . Cervical spine arthritis 11/14/2014  . Primary osteoarthritis of left hip 11/14/2014  . Loss of  consciousness ([place]) 11/14/2014  . Chest pain 03/26/2014  . Musculoskeletal chest and rib pain 03/26/2014  . Rib pain on right side 02/28/2014  . Groin pain 04/18/2013  . UNSPECIFIED RENAL SCLEROSIS 01/16/2009  . GERD, SEVERE 05/25/2007  . Morbid obesity ([place]) 04/03/2007  . HYPERLIPIDEMIA 08/31/2006  . Major depression, recurrent ([place]) 08/31/2006  . 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 Former smoker 08/31/2006  . HYPERTENSION, BENIGN SYSTEMIC 08/31/2006  . GASTRIC ULCER ACUTE WITHOUT HEMORRHAGE 08/31/2006  . CONVULSIONS, SEIZURES, NOS 08/31/2006   Past Medical History:  Diagnosis Date  . Allergy   . Anemia   . Anxiety   . Arthritis   . Back pain, chronic   . Borderline diabetes   . Bronchitis   . Constipation   . Cough   . Depression   . Diabetes mellitus without complication ([place])   . Epilepsy ([place])   . GERD (gastroesophageal reflux disease)   . Hyperlipidemia   . Hypertension   . IBS (irritable bowel syndrome)   . Joint pain   . Obesity   . Palpitations   . Pneumonia 2016  . Pre-diabetes   . 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 Rheumatoid arthritis ([place])   . Seizures ([place])    last >10 yrs ago- recorded 01/18/18  . Somatization disorder   . Tobacco abuse     Family History  Problem Relation Age of Onset  . Other Mother        failed surgery  . Heart Problems Father        poss MI, not sure:per pt  . Alzheimer's disease Maternal Grandmother   . Heart disease Maternal Grandfather   . Colon cancer Maternal Uncle        not sure age of onset    Past Surgical History:  Procedure Laterality Date  . ABDOMINAL HYSTERECTOMY    . ANTERIOR HIP REVISION Left 07/25/2016   Procedure: LEFT HIP IRRIGATION AND DEBRIDEMENT, REVISION OF HEAD AND LINER;  Surgeon: Leandrew Koyanagi, MD;  Location: [facility];  Service: Orthopedics;  Laterality: Left;  . BREAST EXCISIONAL BIOPSY Right   . CHOLECYSTECTOMY    . COLONOSCOPY    . DEBRIDEMENT AND CLOSURE WOUND Left 07/17/2019    Procedure: Excision of left leg wound with ACell placement and primary closure;  Surgeon: Wallace Going, DO;  Location: [facility];  Service: Plastics;  Laterality: Left;  60 min  . HIP DEBRIDEMENT Left 01/19/2018   Procedure(s) Performed: IRRIGATION AND DEBRIDEMENT LEFT HIP (Left )  . INCISION AND DRAINAGE HIP Left 08/03/2016   Procedure: IRRIGATION AND DEBRIDEMENT LEFT HIP, VAC PLACEMENT;  Surgeon: Leandrew Koyanagi, MD;  Location: [facility];  Service: Orthopedics;  Laterality: Left;  . INCISION AND DRAINAGE  HIP Left 01/19/2018   Procedure: IRRIGATION AND DEBRIDEMENT LEFT HIP;  Surgeon: Leandrew Koyanagi, MD;  Location: [facility];  Service: Orthopedics;  Laterality: Left;  . kidney stent Right 2002  . TONSILLECTOMY Bilateral 12/06/2014   Procedure: TONSILLECTOMY, BILATERAL;  Surgeon: Ruby Cola, MD;  Location: [facility];  Service: ENT;  Laterality: Bilateral;  . TONSILLECTOMY    . TOTAL HIP ARTHROPLASTY Left 06/01/2016   Procedure: LEFT TOTAL HIP ARTHROPLASTY ANTERIOR APPROACH;  Surgeon: Leandrew Koyanagi, MD;  Location: [facility];  Service: Orthopedics;  Laterality: Left;  . TUBAL LIGATION    . WOUND EXPLORATION Left 01/19/2018   Procedure: WOUND EXPLORATION;  Surgeon: Leandrew Koyanagi, MD;  Location: [facility];  Service: Orthopedics;  Laterality: Left;   Social History   Occupational History  . Occupation: Not Working  Tobacco Use  . Smoking status: Former Smoker    Packs/day: 0.25    Years: 0.50    Pack years: 0.12    Types: Cigarettes    Quit date: 01/27/2014    Years since quitting: 5.7  . Smokeless tobacco: Never Used  Substance and Sexual Activity  . Alcohol use: No  . Drug use: No  . Sexual activity: Not on file

## 2019-11-13 NOTE — Telephone Encounter (Signed)
Patient informed and no further questions.  Julienne Vogler,CMA

## 2019-11-18 NOTE — Progress Notes (Deleted)
Patient is a 64 year old female here for follow-up on her left hip wound.  She had left hip replacement in 2017 by [name] and postoperatively had a nonhealing hip wound.  She underwent excision of left leg wound and placement of ACell with primary closure on 07/17/2019 with Dr. Marla Roe.  Wound had healed closed at 4/15 visit, but once again opened on 11/04/2019.  At this time orthopedics is recommending a two-stage hip revision as she has a high likelihood of chronic infection of her prosthetic hip.  KY, collagen, 4x4, tape

## 2019-11-21 ENCOUNTER — Ambulatory Visit: Payer: Medicaid Other | Admitting: Plastic Surgery

## 2019-11-22 ENCOUNTER — Other Ambulatory Visit: Payer: Self-pay | Admitting: Orthopaedic Surgery

## 2019-11-22 NOTE — Telephone Encounter (Signed)
Please advise 

## 2019-11-25 ENCOUNTER — Other Ambulatory Visit: Payer: Self-pay

## 2019-11-25 ENCOUNTER — Ambulatory Visit (INDEPENDENT_AMBULATORY_CARE_PROVIDER_SITE_OTHER): Payer: Medicaid Other | Admitting: Bariatrics

## 2019-11-25 ENCOUNTER — Encounter (INDEPENDENT_AMBULATORY_CARE_PROVIDER_SITE_OTHER): Payer: Self-pay | Admitting: Bariatrics

## 2019-11-25 VITALS — BP 118/77 | HR 89 | Temp 98.5°F | Ht 62.0 in | Wt 241.0 lb

## 2019-11-25 DIAGNOSIS — F3289 Other specified depressive episodes: Secondary | ICD-10-CM

## 2019-11-25 DIAGNOSIS — E7849 Other hyperlipidemia: Secondary | ICD-10-CM

## 2019-11-25 DIAGNOSIS — Z6841 Body Mass Index (BMI) 40.0 and over, adult: Secondary | ICD-10-CM | POA: Diagnosis not present

## 2019-11-25 DIAGNOSIS — R7303 Prediabetes: Secondary | ICD-10-CM

## 2019-11-25 MED ORDER — TOPIRAMATE 50 MG PO TABS: 50.0000 mg | ORAL_TABLET | Freq: Every day | ORAL | 0 refills | Status: DC

## 2019-11-25 MED ORDER — METFORMIN HCL 500 MG PO TABS: ORAL_TABLET | ORAL | 0 refills | Status: DC

## 2019-11-25 NOTE — Progress Notes (Signed)
Chief Complaint:   Kristin [name] is here to discuss her progress with her Kristin treatment plan along with follow-up of her Kristin related diagnoses. Kristin [name] is on the Category 2 Plan and states she is following her eating plan approximately 90% of the time. Kristin [name] states she is exercising 0 minutes 0 times per week.  Today's visit was #: 3 Starting weight: 252 lbs Starting date: 10/23/2019 Today's weight: 241 lbs Today's date: 11/25/2019 Total lbs lost to date: 11 Total lbs lost since last in-office visit: 5  Interim History: Kristin [name] is down 5 lbs and doing well overall. She states she stuck with the plan. She reports doing some stress eating.  Subjective:   Prediabetes. Kristin [name] has a diagnosis of prediabetes based on her elevated HgA1c and was informed this puts her at greater risk of developing diabetes. She continues to work on diet and exercise to decrease her risk of diabetes. She denies nausea or hypoglycemia. Morning is taking metformin.  Lab Results  Component Value Date   HGBA1C 6.3 (H) 10/23/2019   Lab Results  Component Value Date   INSULIN 13.1 10/23/2019   Other hyperlipidemia. Kristin [name] is taking Zocor.   Lab Results  Component Value Date   CHOL 148 10/23/2019   HDL 50 10/23/2019   LDLCALC 76 10/23/2019   TRIG 123 10/23/2019   CHOLHDL 2.7 04/17/2017   Lab Results  Component Value Date   ALT 14 10/23/2019   AST 13 10/23/2019   ALKPHOS 141 (H) 10/23/2019   BILITOT 0.3 10/23/2019   The 10-year ASCVD risk score Kristin Bussing DC Jr., et al., 2013) is: 11.4%   Values used to calculate the score:     Age: 64 years     Sex: Female     Is Non-Hispanic African American: Yes     Diabetic: Yes     Tobacco smoker: No     Systolic Blood Pressure: 123456 mmHg     Is BP treated: Yes     HDL Cholesterol: 50 mg/dL     Total Cholesterol: 148 mg/dL  Other depression, with emotional eating. Kristin [name] is struggling with emotional eating and using food for  comfort to the extent that it is negatively impacting her health. She has been working on behavior modification techniques to help reduce her emotional eating and has been somewhat successful. She shows no sign of suicidal or homicidal ideations. Kristin [name] reports an increase in stress.  Assessment/Plan:   Prediabetes. Kristin [name] will continue to work on weight loss, exercise, and decreasing simple carbohydrates to help decrease the risk of diabetes. She will continue metformin as directed.  Other hyperlipidemia. Cardiovascular risk and specific lipid/LDL goals reviewed.  We discussed several lifestyle modifications today and Kristin [name] will continue to work on diet, exercise and weight loss efforts. Orders and follow up as documented in patient record. Kristin [name] will continue Zocor as directed. She will decrease saturated fats and will increase PUFA's and MUFA's.  Counseling Intensive lifestyle modifications are the first line treatment for this issue. . Dietary changes: Increase soluble fiber. Decrease simple carbohydrates. . Exercise changes: Moderate to vigorous-intensity aerobic activity 150 minutes per week if tolerated. . Lipid-lowering medications: see documented in medical record.  Other depression, with emotional eating. Behavior modification techniques were discussed today to help Kristin [name] deal with her emotional/non-hunger eating behaviors.  Orders and follow up as documented in patient record. Prescription was given for topiramate (TOPAMAX) 50 MG tablet 1 with evening meal daily #30 with 0 refills.  Class 3 severe Kristin with serious comorbidity and body mass index (BMI) of 40.0 to 44.9 in adult, unspecified Kristin type (Kristin [name]).  Kristin [name] is currently in the action stage of change. As such, her goal is to continue with weight loss efforts. She has agreed to the Category 2 Plan.    She will work on meal planning and intentional eating.  Exercise goals: Kristin [name] will walk until she has  surgery.  Behavioral modification strategies: increasing lean protein intake, decreasing simple carbohydrates, increasing vegetables, increasing water intake, decreasing eating out, no skipping meals, meal planning and cooking strategies, keeping healthy foods in the home and planning for success.  Kristin [name] has agreed to follow-up with our clinic in 2 weeks. She was informed of the importance of frequent follow-up visits to maximize her success with intensive lifestyle modifications for her multiple health conditions.   Objective:   Blood pressure 118/77, pulse 89, temperature 98.5 F (36.9 C), temperature source Oral, height 5\' 2"  (1.575 m), weight 241 lb (109.3 kg), SpO2 97 %. Body mass index is 44.08 kg/m.  General: Cooperative, alert, well developed, in no acute distress. HEENT: Conjunctivae and lids unremarkable. Cardiovascular: Regular rhythm.  Lungs: Normal work of breathing. Neurologic: No focal deficits.   Lab Results  Component Value Date   CREATININE 1.11 (H) 10/23/2019   BUN 11 10/23/2019   NA 139 10/23/2019   K 4.5 10/23/2019   CL 96 10/23/2019   CO2 25 10/23/2019   Lab Results  Component Value Date   ALT 14 10/23/2019   AST 13 10/23/2019   ALKPHOS 141 (H) 10/23/2019   BILITOT 0.3 10/23/2019   Lab Results  Component Value Date   HGBA1C 6.3 (H) 10/23/2019   HGBA1C 6.9 02/06/2018   HGBA1C 6.7 04/14/2017   HGBA1C 5.8 09/08/2016   HGBA1C 6.5 05/03/2016   Lab Results  Component Value Date   INSULIN 13.1 10/23/2019   Lab Results  Component Value Date   TSH 0.943 10/23/2019   Lab Results  Component Value Date   CHOL 148 10/23/2019   HDL 50 10/23/2019   LDLCALC 76 10/23/2019   TRIG 123 10/23/2019   CHOLHDL 2.7 04/17/2017   Lab Results  Component Value Date   WBC 4.5 10/23/2019   HGB 11.2 10/23/2019   HCT 34.5 10/23/2019   MCV 87 10/23/2019   PLT 339 10/23/2019   Lab Results  Component Value Date   FERRITIN 166 (H) 02/06/2018   Attestation  Statements:   Reviewed by clinician on day of visit: allergies, medications, problem list, medical history, surgical history, family history, social history, and previous encounter notes.  Time spent on visit including pre-visit chart review and post-visit charting and care was 20 minutes.   Migdalia Dk, am acting as Location manager for [employer], DO   I have reviewed the above documentation for accuracy and completeness, and I agree with the above. Jearld Lesch, DO

## 2019-11-26 DIAGNOSIS — F411 Generalized anxiety disorder: Secondary | ICD-10-CM | POA: Diagnosis not present

## 2019-11-26 MED ORDER — METFORMIN HCL 500 MG PO TABS: ORAL_TABLET | ORAL | 0 refills | Status: DC

## 2019-11-26 NOTE — Telephone Encounter (Signed)
Medication sent to the wrong pharmacy. Called and cancelled order at [facility]. Resent rx to [facility] per patient request.   Talbot Grumbling, RN

## 2019-11-26 NOTE — Addendum Note (Signed)
Addended by: Talbot Grumbling on: 11/26/2019 03:38 PM   Modules accepted: Orders

## 2019-12-04 ENCOUNTER — Other Ambulatory Visit: Payer: Self-pay

## 2019-12-10 ENCOUNTER — Other Ambulatory Visit: Payer: Self-pay | Admitting: Family

## 2019-12-11 DIAGNOSIS — F331 Major depressive disorder, recurrent, moderate: Secondary | ICD-10-CM | POA: Diagnosis not present

## 2019-12-13 ENCOUNTER — Telehealth: Payer: Self-pay | Admitting: Orthopaedic Surgery

## 2019-12-13 NOTE — Telephone Encounter (Signed)
Patient called.  Said she was already waiting on a call back from her providers but now needing to know when she is to stop taking her medications in preparation for surgery.   Call back: [phone]

## 2019-12-13 NOTE — Telephone Encounter (Signed)
She should stop taking her antibiotic only

## 2019-12-13 NOTE — Telephone Encounter (Signed)
Patient aware.   She would like a call back from you to discuss some things. Her family is concerned.  Will it be the same manufacture/brand for her SU. Will you be using a different one this time?

## 2019-12-17 ENCOUNTER — Ambulatory Visit (INDEPENDENT_AMBULATORY_CARE_PROVIDER_SITE_OTHER): Payer: Medicaid Other | Admitting: Bariatrics

## 2019-12-17 ENCOUNTER — Other Ambulatory Visit: Payer: Self-pay

## 2019-12-17 ENCOUNTER — Encounter (INDEPENDENT_AMBULATORY_CARE_PROVIDER_SITE_OTHER): Payer: Self-pay | Admitting: Bariatrics

## 2019-12-17 VITALS — BP 130/83 | HR 87 | Temp 98.5°F | Ht 62.0 in | Wt 236.0 lb

## 2019-12-17 DIAGNOSIS — R7303 Prediabetes: Secondary | ICD-10-CM

## 2019-12-17 DIAGNOSIS — Z6841 Body Mass Index (BMI) 40.0 and over, adult: Secondary | ICD-10-CM | POA: Diagnosis not present

## 2019-12-17 DIAGNOSIS — F3289 Other specified depressive episodes: Secondary | ICD-10-CM

## 2019-12-17 MED ORDER — TOPIRAMATE 50 MG PO TABS: 50.0000 mg | ORAL_TABLET | Freq: Every day | ORAL | 0 refills | Status: DC

## 2019-12-17 NOTE — Progress Notes (Signed)
Your procedure is scheduled on Monday, June 21st.  Report to [facility] Main Entrance "A" at 10:35 A.M., and check in at the Admitting office.  Call this number if you have problems the morning of surgery:  [phone]  Call [phone] if you have any questions prior to your surgery date Monday-Friday 8am-4pm   Remember:  Do not eat after midnight the night before your surgery  You may drink clear liquids until 9:35 A.M. the morning of your surgery.   Clear liquids allowed are: Water, Non-Citrus Juices (without pulp), Carbonated Beverages, Clear Tea, Black Coffee Only, and Gatorade   Enhanced Recovery after Surgery for Orthopedics Enhanced Recovery after Surgery is a protocol used to improve the stress on your body and your recovery after surgery.  Patient Instructions  . The night before surgery:  o No food after midnight. ONLY clear liquids after midnight  . The day of surgery (if you diabetes):  o Drink ONE (1) G2 Clear Ensure by 9:35 A.M. the morning of surgery  o The pre-op nurse will instruct you on the time to drink the  G2 depending on your surgery time. o Finish the drink at the designated time by the pre-op nurse.  o Nothing else to drink after completing the  Pre-Surgery G2.    Take these medicines the morning of surgery with A SIP OF WATER metoprolol tartrate (LOPRESSOR) topiramate (TOPAMAX)  If needed - acetaminophen (TYLENOL), levocetirizine (XYZAL)   As of today, STOP taking any Aspirin (unless otherwise instructed by your surgeon) and Aspirin containing products, Aleve, Naproxen, Ibuprofen, Motrin, Advil, Goody's, BC's, all herbal medications, fish oil, and all vitamins.          WHAT DO I DO ABOUT MY DIABETES MEDICATION?  -The morning of surgery Do NOT take metFORMIN (GLUCOPHAGE)   HOW TO MANAGE YOUR DIABETES BEFORE AND AFTER SURGERY  Why is it important to control my blood sugar before and after surgery? . Improving blood sugar levels before and  after surgery helps healing and can limit problems. . A way of improving blood sugar control is eating a healthy diet by: o  Eating less sugar and carbohydrates o  Increasing activity/exercise o  Talking with your doctor about reaching your blood sugar goals . High blood sugars (greater than 180 mg/dL) can raise your risk of infections and slow your recovery, so you will need to focus on controlling your diabetes during the weeks before surgery. . Make sure that the doctor who takes care of your diabetes knows about your planned surgery including the date and location.  How do I manage my blood sugar before surgery? . Check your blood sugar at least 4 times a day, starting 2 days before surgery, to make sure that the level is not too high or low. . Check your blood sugar the morning of your surgery when you wake up and every 2 hours until you get to the Short Stay unit. o If your blood sugar is less than 70 mg/dL, you will need to treat for low blood sugar: - Do not take insulin. - Treat a low blood sugar (less than 70 mg/dL) with  cup of clear juice (cranberry or apple), 4 glucose tablets, OR glucose gel. - Recheck blood sugar in 15 minutes after treatment (to make sure it is greater than 70 mg/dL). If your blood sugar is not greater than 70 mg/dL on recheck, call [phone] for further instructions. . Report your blood sugar to the short stay nurse  when you get to Short Stay.  . If you are admitted to the hospital after surgery: o Your blood sugar will be checked by the staff and you will probably be given insulin after surgery (instead of oral diabetes medicines) to make sure you have good blood sugar levels. o The goal for blood sugar control after surgery is 80-180 mg/dL.           Do not wear jewelry, make up, or nail polish            Do not wear lotions, powders, perfumes, or deodorant.            Do not shave 48 hours prior to surgery.             Do not bring valuables to the  hospital.            [facility] is not responsible for any belongings or valuables.  Do NOT Smoke (Tobacco/Vapping) or drink Alcohol 24 hours prior to your procedure If you use a CPAP at night, you may bring all equipment for your overnight stay.   Contacts, glasses, dentures or bridgework may not be worn into surgery.      For patients admitted to the hospital, discharge time will be determined by your treatment team.   Patients discharged the day of surgery will not be allowed to drive home, and someone needs to stay with them for 24 hours.  Special instructions:   [place]- Preparing For Surgery  Before surgery, you can play an important role. Because skin is not sterile, your skin needs to be as free of germs as possible. You can reduce the number of germs on your skin by washing with CHG (chlorahexidine gluconate) Soap before surgery.  CHG is an antiseptic cleaner which kills germs and bonds with the skin to continue killing germs even after washing.    Oral Hygiene is also important to reduce your risk of infection.  Remember - BRUSH YOUR TEETH THE MORNING OF SURGERY WITH YOUR REGULAR TOOTHPASTE  Please do not use if you have an allergy to CHG or antibacterial soaps. If your skin becomes reddened/irritated stop using the CHG.  Do not shave (including legs and underarms) for at least 48 hours prior to first CHG shower. It is OK to shave your face.  Please follow these instructions carefully.   1. Shower the NIGHT BEFORE SURGERY and the MORNING OF SURGERY with CHG Soap.   2. If you chose to wash your hair, wash your hair first as usual with your normal shampoo.  3. After you shampoo, rinse your hair and body thoroughly to remove the shampoo.  4. Use CHG as you would any other liquid soap. You can apply CHG directly to the skin and wash gently with a scrungie or a clean washcloth.   5. Apply the CHG Soap to your body ONLY FROM THE NECK DOWN.  Do not use on open wounds or open  sores. Avoid contact with your eyes, ears, mouth and genitals (private parts). Wash Face and genitals (private parts)  with your normal soap.   6. Wash thoroughly, paying special attention to the area where your surgery will be performed.  7. Thoroughly rinse your body with warm water from the neck down.  8. DO NOT shower/wash with your normal soap after using and rinsing off the CHG Soap.  9. Pat yourself dry with a CLEAN TOWEL.  10. Wear CLEAN PAJAMAS to bed the night before  surgery, wear comfortable clothes the morning of surgery  11. Place CLEAN SHEETS on your bed the night of your first shower and DO NOT SLEEP WITH PETS.  Day of Surgery: Shower with CHG soap as instructed above.  Do not apply any deodorants/lotions.  Please wear clean clothes to the hospital/surgery center.   Remember to brush your teeth WITH YOUR REGULAR TOOTHPASTE.   Please read over the following fact sheets that you were given.

## 2019-12-18 ENCOUNTER — Encounter (HOSPITAL_COMMUNITY): Payer: Self-pay

## 2019-12-18 ENCOUNTER — Encounter (INDEPENDENT_AMBULATORY_CARE_PROVIDER_SITE_OTHER): Payer: Self-pay | Admitting: Bariatrics

## 2019-12-18 ENCOUNTER — Encounter (HOSPITAL_COMMUNITY)
Admission: RE | Admit: 2019-12-18 | Discharge: 2019-12-18 | Disposition: A | Discharge status: Home / Self Care | Payer: Medicaid Other | Source: Ambulatory Visit | Attending: Orthopaedic Surgery | Admitting: Orthopaedic Surgery

## 2019-12-18 ENCOUNTER — Other Ambulatory Visit: Payer: Self-pay

## 2019-12-18 ENCOUNTER — Ambulatory Visit (HOSPITAL_COMMUNITY)
Admission: RE | Admit: 2019-12-18 | Discharge: 2019-12-18 | Disposition: A | Discharge status: Home / Self Care | Payer: Medicaid Other | Source: Ambulatory Visit | Attending: Family | Admitting: Family

## 2019-12-18 DIAGNOSIS — Z01818 Encounter for other preprocedural examination: Secondary | ICD-10-CM | POA: Diagnosis not present

## 2019-12-18 DIAGNOSIS — Z01811 Encounter for preprocedural respiratory examination: Secondary | ICD-10-CM | POA: Insufficient documentation

## 2019-12-18 LAB — CBC
HCT: 34.1 % — ABNORMAL LOW (ref 36.0–46.0)
Hemoglobin: 10.4 g/dL — ABNORMAL LOW (ref 12.0–15.0)
MCH: 28.5 pg (ref 26.0–34.0)
MCHC: 30.5 g/dL (ref 30.0–36.0)
MCV: 93.4 fL (ref 80.0–100.0)
Platelets: 320 10*3/uL (ref 150–400)
RBC: 3.65 MIL/uL — ABNORMAL LOW (ref 3.87–5.11)
RDW: 14.6 % (ref 11.5–15.5)
WBC: 5 10*3/uL (ref 4.0–10.5)
nRBC: 0 % (ref 0.0–0.2)

## 2019-12-18 LAB — URINALYSIS, ROUTINE W REFLEX MICROSCOPIC
Bacteria, UA: NONE SEEN
Bilirubin Urine: NEGATIVE
Glucose, UA: NEGATIVE mg/dL
Hgb urine dipstick: NEGATIVE
Ketones, ur: NEGATIVE mg/dL
Nitrite: NEGATIVE
Protein, ur: NEGATIVE mg/dL
Specific Gravity, Urine: 1.024 (ref 1.005–1.030)
pH: 5 (ref 5.0–8.0)

## 2019-12-18 LAB — COMPREHENSIVE METABOLIC PANEL
ALT: 17 U/L (ref 0–44)
AST: 14 U/L — ABNORMAL LOW (ref 15–41)
Albumin: 4 g/dL (ref 3.5–5.0)
Alkaline Phosphatase: 95 U/L (ref 38–126)
Anion gap: 10 (ref 5–15)
BUN: 12 mg/dL (ref 8–23)
CO2: 26 mmol/L (ref 22–32)
Calcium: 9.8 mg/dL (ref 8.9–10.3)
Chloride: 103 mmol/L (ref 98–111)
Creatinine, Ser: 1.11 mg/dL — ABNORMAL HIGH (ref 0.44–1.00)
GFR calc Af Amer: >60 mL/min (ref >60)
GFR calc non Af Amer: 53 mL/min — ABNORMAL LOW (ref >60)
Glucose, Bld: 105 mg/dL — ABNORMAL HIGH (ref 70–99)
Potassium: 3.6 mmol/L (ref 3.5–5.1)
Sodium: 139 mmol/L (ref 135–145)
Total Bilirubin: 0.4 mg/dL (ref 0.3–1.2)
Total Protein: 7.2 g/dL (ref 6.5–8.1)

## 2019-12-18 LAB — SURGICAL PCR SCREEN
MRSA, PCR: NEGATIVE
Staphylococcus aureus: NEGATIVE

## 2019-12-18 LAB — GLUCOSE, CAPILLARY: Glucose-Capillary: 92 mg/dL (ref 70–99)

## 2019-12-18 LAB — PROTIME-INR
INR: 1 (ref 0.8–1.2)
Prothrombin Time: 13.2 seconds (ref 11.4–15.2)

## 2019-12-18 LAB — APTT: aPTT: 32 seconds (ref 24–36)

## 2019-12-18 NOTE — Progress Notes (Addendum)
PCP - Josephine Igo Cardiologist - denies   Chest x-ray - 12-18-19 EKG - 12-18-19 Stress Test - 01-02-15 ECHO - 12-10-14  Pre-Diabetic Fasting Blood Sugar - does not check sugars (per patient)   ERAS Protcol - yes, G2 ordered and given    COVID TEST- Friday 12-20-19   Anesthesia review: n/a Jeneen Rinks notified of abnormal EKG.  Per Jeneen Rinks, does not need to see patient.    Patient denies shortness of breath, fever, cough and chest pain at PAT appointment   All instructions explained to the patient, with a verbal understanding of the material. Patient agrees to go over the instructions while at home for a better understanding. Patient also instructed to self quarantine after being tested for COVID-19. The opportunity to ask questions was provided.

## 2019-12-18 NOTE — Progress Notes (Signed)
Chief Complaint:   OBESITY Kristin [name] is here to discuss her progress with her obesity treatment plan along with follow-up of her obesity related diagnoses. Kristin [name] is on the Category 2 Plan and states she is following her eating plan approximately 60% of the time. Kristin [name] states she is exercising for 0 minutes 0 times per week.  Today's visit was #: 4 Starting weight: 252 lbs Starting date: 10/23/2019 Today's weight: 236 lbs Today's date: 12/17/2019 Total lbs lost to date: 16 lbs Total lbs lost since last in-office visit: 5 lbs  Interim History: Kristin [name] is down an additional 5 pounds and has done well overall.  She is scheduled for left hip resection arthroplasty on 12/23/2019.  Subjective:   1. Prediabetes Kristin [name] has a diagnosis of prediabetes based on her elevated HgA1c and was informed this puts her at greater risk of developing diabetes. She continues to work on diet and exercise to decrease her risk of diabetes. She denies nausea or hypoglycemia.  She is taking metformin.  Lab Results  Component Value Date   HGBA1C 6.3 (H) 10/23/2019   Lab Results  Component Value Date   INSULIN 13.1 10/23/2019   2. Other depression, with emotional eating  Kristin [name] is struggling with emotional eating and using food for comfort to the extent that it is negatively impacting her health. She has been working on behavior modification techniques to help reduce her emotional eating and has been somewhat successful. She shows no sign of suicidal or homicidal ideations.  She is taking Topamax.  Assessment/Plan:   1. Prediabetes Kristin [name] will continue to work on weight loss, exercise, and decreasing simple carbohydrates to help decrease the risk of diabetes.  Continue metformin.  2. Other depression, with emotional eating  Behavior modification techniques were discussed today to help Kristin [name] deal with her emotional/non-hunger eating behaviors.  Orders and follow up as documented in patient record.    - topiramate (TOPAMAX) 50 MG tablet; Take 1 tablet (50 mg total) by mouth daily.  Dispense: 30 tablet; Refill: 0  3. Class 3 severe obesity with serious comorbidity and body mass index (BMI) of 40.0 to 44.9 in adult, unspecified obesity type [facility]) Kristin [name] is currently in the action stage of change. As such, her goal is to continue with weight loss efforts. She has agreed to the Category 2 Plan.   Exercise goals: All adults should avoid inactivity. Some physical activity is better than none, and adults who participate in any amount of physical activity gain some health benefits.  Behavioral modification strategies: increasing lean protein intake, decreasing simple carbohydrates, increasing vegetables, increasing water intake, decreasing eating out, no skipping meals, meal planning and cooking strategies, keeping healthy foods in the home and planning for success.  Kristin [name] will work on meal planning, intentional eating, and will have protein drinks.  Kristin [name] has agreed to follow-up with our clinic in 4 weeks. She was informed of the importance of frequent follow-up visits to maximize her success with intensive lifestyle modifications for her multiple health conditions.   Objective:   Blood pressure 130/83, pulse 87, temperature 98.5 F (36.9 C), height 5\' 2"  (1.575 m), weight 236 lb (107 kg), SpO2 97 %. Body mass index is 43.16 kg/m.  General: Cooperative, alert, well developed, in no acute distress. HEENT: Conjunctivae and lids unremarkable. Cardiovascular: Regular rhythm.  Lungs: Normal work of breathing. Neurologic: No focal deficits.  Using a cane for ambulation.  Lab Results  Component Value Date   CREATININE 1.11 (H) 10/23/2019  BUN 11 10/23/2019   NA 139 10/23/2019   K 4.5 10/23/2019   CL 96 10/23/2019   CO2 25 10/23/2019   Lab Results  Component Value Date   ALT 14 10/23/2019   AST 13 10/23/2019   ALKPHOS 141 (H) 10/23/2019   BILITOT 0.3 10/23/2019   Lab Results   Component Value Date   HGBA1C 6.3 (H) 10/23/2019   HGBA1C 6.9 02/06/2018   HGBA1C 6.7 04/14/2017   HGBA1C 5.8 09/08/2016   HGBA1C 6.5 05/03/2016   Lab Results  Component Value Date   INSULIN 13.1 10/23/2019   Lab Results  Component Value Date   TSH 0.943 10/23/2019   Lab Results  Component Value Date   CHOL 148 10/23/2019   HDL 50 10/23/2019   LDLCALC 76 10/23/2019   TRIG 123 10/23/2019   CHOLHDL 2.7 04/17/2017   Lab Results  Component Value Date   WBC 4.5 10/23/2019   HGB 11.2 10/23/2019   HCT 34.5 10/23/2019   MCV 87 10/23/2019   PLT 339 10/23/2019   Lab Results  Component Value Date   FERRITIN 166 (H) 02/06/2018   Attestation Statements:   Reviewed by clinician on day of visit: allergies, medications, problem list, medical history, surgical history, family history, social history, and previous encounter notes.  I, Water quality scientist, CMA, am acting as Location manager for [employer], DO  I have reviewed the above documentation for accuracy and completeness, and I agree with the above. Jearld Lesch, DO

## 2019-12-20 ENCOUNTER — Other Ambulatory Visit (HOSPITAL_COMMUNITY)
Admission: RE | Admit: 2019-12-20 | Discharge: 2019-12-20 | Disposition: A | Discharge status: Home / Self Care | Payer: Medicaid Other | Source: Ambulatory Visit | Attending: Orthopaedic Surgery | Admitting: Orthopaedic Surgery

## 2019-12-20 DIAGNOSIS — Z20822 Contact with and (suspected) exposure to covid-19: Secondary | ICD-10-CM | POA: Insufficient documentation

## 2019-12-20 DIAGNOSIS — Z01812 Encounter for preprocedural laboratory examination: Secondary | ICD-10-CM | POA: Diagnosis not present

## 2019-12-20 LAB — SARS CORONAVIRUS 2 (TAT 6-24 HRS): SARS Coronavirus 2: NEGATIVE

## 2019-12-20 MED ORDER — TRANEXAMIC ACID 1000 MG/10ML IV SOLN
2000.0000 mg | INTRAVENOUS | Status: DC
  Filled 2019-12-20: qty 20

## 2019-12-23 ENCOUNTER — Inpatient Hospital Stay (HOSPITAL_COMMUNITY): Payer: Medicaid Other

## 2019-12-23 ENCOUNTER — Encounter (HOSPITAL_COMMUNITY): Payer: Self-pay | Admitting: Orthopaedic Surgery

## 2019-12-23 ENCOUNTER — Inpatient Hospital Stay (HOSPITAL_COMMUNITY): Payer: Medicaid Other | Admitting: Anesthesiology

## 2019-12-23 ENCOUNTER — Other Ambulatory Visit: Payer: Self-pay

## 2019-12-23 ENCOUNTER — Encounter (HOSPITAL_COMMUNITY)
Admission: RE | Disposition: A | Discharge status: Home Health | Payer: Self-pay | Source: Home / Self Care | Attending: Orthopaedic Surgery

## 2019-12-23 ENCOUNTER — Inpatient Hospital Stay (HOSPITAL_COMMUNITY): Payer: Medicaid Other | Admitting: Physician Assistant

## 2019-12-23 ENCOUNTER — Inpatient Hospital Stay (HOSPITAL_COMMUNITY)
Admission: RE | Admit: 2019-12-23 | Discharge: 2019-12-26 | DRG: 467 | Disposition: A | Discharge status: Home Health | Payer: Medicaid Other | Attending: Orthopaedic Surgery | Admitting: Orthopaedic Surgery

## 2019-12-23 DIAGNOSIS — Z820 Family history of epilepsy and other diseases of the nervous system: Secondary | ICD-10-CM | POA: Diagnosis not present

## 2019-12-23 DIAGNOSIS — Z96649 Presence of unspecified artificial hip joint: Secondary | ICD-10-CM

## 2019-12-23 DIAGNOSIS — Z800 Family history of malignant neoplasm of digestive organs: Secondary | ICD-10-CM

## 2019-12-23 DIAGNOSIS — Z8249 Family history of ischemic heart disease and other diseases of the circulatory system: Secondary | ICD-10-CM

## 2019-12-23 DIAGNOSIS — E785 Hyperlipidemia, unspecified: Secondary | ICD-10-CM | POA: Diagnosis not present

## 2019-12-23 DIAGNOSIS — E119 Type 2 diabetes mellitus without complications: Secondary | ICD-10-CM | POA: Diagnosis not present

## 2019-12-23 DIAGNOSIS — T8452XD Infection and inflammatory reaction due to internal left hip prosthesis, subsequent encounter: Secondary | ICD-10-CM

## 2019-12-23 DIAGNOSIS — Y831 Surgical operation with implant of artificial internal device as the cause of abnormal reaction of the patient, or of later complication, without mention of misadventure at the time of the procedure: Secondary | ICD-10-CM | POA: Diagnosis present

## 2019-12-23 DIAGNOSIS — D62 Acute posthemorrhagic anemia: Secondary | ICD-10-CM | POA: Diagnosis not present

## 2019-12-23 DIAGNOSIS — Z888 Allergy status to other drugs, medicaments and biological substances status: Secondary | ICD-10-CM | POA: Diagnosis not present

## 2019-12-23 DIAGNOSIS — Z6841 Body Mass Index (BMI) 40.0 and over, adult: Secondary | ICD-10-CM

## 2019-12-23 DIAGNOSIS — M069 Rheumatoid arthritis, unspecified: Secondary | ICD-10-CM | POA: Diagnosis not present

## 2019-12-23 DIAGNOSIS — T8452XA Infection and inflammatory reaction due to internal left hip prosthesis, initial encounter: Principal | ICD-10-CM | POA: Diagnosis present

## 2019-12-23 DIAGNOSIS — G40909 Epilepsy, unspecified, not intractable, without status epilepticus: Secondary | ICD-10-CM | POA: Diagnosis present

## 2019-12-23 DIAGNOSIS — I10 Essential (primary) hypertension: Secondary | ICD-10-CM | POA: Diagnosis not present

## 2019-12-23 DIAGNOSIS — S81802S Unspecified open wound, left lower leg, sequela: Secondary | ICD-10-CM

## 2019-12-23 DIAGNOSIS — T8452XS Infection and inflammatory reaction due to internal left hip prosthesis, sequela: Secondary | ICD-10-CM | POA: Diagnosis not present

## 2019-12-23 DIAGNOSIS — Z90710 Acquired absence of both cervix and uterus: Secondary | ICD-10-CM

## 2019-12-23 DIAGNOSIS — Z8614 Personal history of Methicillin resistant Staphylococcus aureus infection: Secondary | ICD-10-CM | POA: Diagnosis not present

## 2019-12-23 DIAGNOSIS — Z471 Aftercare following joint replacement surgery: Secondary | ICD-10-CM | POA: Diagnosis not present

## 2019-12-23 DIAGNOSIS — Z9049 Acquired absence of other specified parts of digestive tract: Secondary | ICD-10-CM | POA: Diagnosis not present

## 2019-12-23 DIAGNOSIS — Z79899 Other long term (current) drug therapy: Secondary | ICD-10-CM

## 2019-12-23 DIAGNOSIS — M01X51 Direct infection of right hip in infectious and parasitic diseases classified elsewhere: Secondary | ICD-10-CM | POA: Diagnosis not present

## 2019-12-23 DIAGNOSIS — Z96642 Presence of left artificial hip joint: Secondary | ICD-10-CM | POA: Diagnosis not present

## 2019-12-23 DIAGNOSIS — Z7984 Long term (current) use of oral hypoglycemic drugs: Secondary | ICD-10-CM

## 2019-12-23 DIAGNOSIS — Z87891 Personal history of nicotine dependence: Secondary | ICD-10-CM

## 2019-12-23 DIAGNOSIS — K219 Gastro-esophageal reflux disease without esophagitis: Secondary | ICD-10-CM | POA: Diagnosis present

## 2019-12-23 HISTORY — PX: APPLICATION OF WOUND VAC: SHX5189

## 2019-12-23 HISTORY — PX: EXCISIONAL TOTAL HIP ARTHROPLASTY WITH ANTIBIOTIC SPACERS: SHX5826

## 2019-12-23 LAB — POCT I-STAT EG7
Acid-Base Excess: 6 mmol/L — ABNORMAL HIGH (ref 0.0–2.0)
Bicarbonate: 32 mmol/L — ABNORMAL HIGH (ref 20.0–28.0)
Calcium, Ion: 1.25 mmol/L (ref 1.15–1.40)
HCT: 30 % — ABNORMAL LOW (ref 36.0–46.0)
Hemoglobin: 10.2 g/dL — ABNORMAL LOW (ref 12.0–15.0)
O2 Saturation: 74 %
Patient temperature: 35.5
Potassium: 3.9 mmol/L (ref 3.5–5.1)
Sodium: 138 mmol/L (ref 135–145)
TCO2: 34 mmol/L — ABNORMAL HIGH (ref 22–32)
pCO2, Ven: 48.3 mmHg (ref 44.0–60.0)
pH, Ven: 7.423 (ref 7.250–7.430)
pO2, Ven: 36 mmHg (ref 32.0–45.0)

## 2019-12-23 LAB — GLUCOSE, CAPILLARY
Glucose-Capillary: 105 mg/dL — ABNORMAL HIGH (ref 70–99)
Glucose-Capillary: 83 mg/dL (ref 70–99)
Glucose-Capillary: 157 mg/dL — ABNORMAL HIGH (ref 70–99)
Glucose-Capillary: 161 mg/dL — ABNORMAL HIGH (ref 70–99)

## 2019-12-23 LAB — TYPE AND SCREEN
ABO/RH(D): A POS
Antibody Screen: NEGATIVE

## 2019-12-23 SURGERY — EXCISIONAL TOTAL HIP ARTHROPLASTY WITH ANTIBIOTIC SPACERS
Anesthesia: Spinal | Site: Hip | Laterality: Left

## 2019-12-23 MED ORDER — 0.9 % SODIUM CHLORIDE (POUR BTL) OPTIME
TOPICAL | Status: DC | PRN
  Administered 2019-12-23: 1000 mL

## 2019-12-23 MED ORDER — ONDANSETRON HCL 4 MG PO TABS: 4.0000 mg | ORAL_TABLET | Freq: Four times a day (QID) | ORAL | Status: DC | PRN

## 2019-12-23 MED ORDER — PIPERACILLIN-TAZOBACTAM 3.375 G IVPB 30 MIN
3.3750 g | Freq: Once | INTRAVENOUS | Status: AC
  Administered 2019-12-23: 3.375 g via INTRAVENOUS
  Filled 2019-12-23: qty 50

## 2019-12-23 MED ORDER — SORBITOL 70 % SOLN: 30.0000 mL | Freq: Every day | Status: DC | PRN

## 2019-12-23 MED ORDER — MENTHOL 3 MG MT LOZG: 1.0000 | LOZENGE | OROMUCOSAL | Status: DC | PRN

## 2019-12-23 MED ORDER — VANCOMYCIN HCL 1000 MG IV SOLR
INTRAVENOUS | Status: DC | PRN
  Administered 2019-12-23: 1000 mg via INTRAVENOUS

## 2019-12-23 MED ORDER — LACTATED RINGERS IV SOLN: INTRAVENOUS | Status: DC | PRN

## 2019-12-23 MED ORDER — ACETAMINOPHEN 325 MG PO TABS
325.0000 mg | ORAL_TABLET | Freq: Four times a day (QID) | ORAL | Status: DC | PRN
  Administered 2019-12-25: 650 mg via ORAL
  Filled 2019-12-23: qty 2

## 2019-12-23 MED ORDER — SIMVASTATIN 20 MG PO TABS
20.0000 mg | ORAL_TABLET | Freq: Every day | ORAL | Status: DC
  Administered 2019-12-23 – 2019-12-25 (×3): 20 mg via ORAL
  Filled 2019-12-23 (×3): qty 1

## 2019-12-23 MED ORDER — AMITRIPTYLINE HCL 50 MG PO TABS
150.0000 mg | ORAL_TABLET | Freq: Every day | ORAL | Status: DC
  Administered 2019-12-23 – 2019-12-25 (×3): 150 mg via ORAL
  Filled 2019-12-23 (×3): qty 3

## 2019-12-23 MED ORDER — CHLORHEXIDINE GLUCONATE 0.12 % MT SOLN
15.0000 mL | Freq: Once | OROMUCOSAL | Status: AC
  Administered 2019-12-23: 15 mL via OROMUCOSAL
  Filled 2019-12-23: qty 15

## 2019-12-23 MED ORDER — HYDROMORPHONE HCL 1 MG/ML IJ SOLN
0.2500 mg | INTRAMUSCULAR | Status: DC | PRN
  Administered 2019-12-23 (×3): 0.25 mg via INTRAVENOUS
  Administered 2019-12-23 (×2): 0.5 mg via INTRAVENOUS

## 2019-12-23 MED ORDER — ORAL CARE MOUTH RINSE: 15.0000 mL | Freq: Once | OROMUCOSAL | Status: AC

## 2019-12-23 MED ORDER — DEXAMETHASONE SODIUM PHOSPHATE 10 MG/ML IJ SOLN
10.0000 mg | Freq: Once | INTRAMUSCULAR | Status: AC
  Administered 2019-12-24: 10 mg via INTRAVENOUS
  Filled 2019-12-23: qty 1

## 2019-12-23 MED ORDER — TRANEXAMIC ACID-NACL 1000-0.7 MG/100ML-% IV SOLN
1000.0000 mg | INTRAVENOUS | Status: AC
  Administered 2019-12-23: 1000 mg via INTRAVENOUS
  Filled 2019-12-23: qty 100

## 2019-12-23 MED ORDER — ROCURONIUM BROMIDE 10 MG/ML (PF) SYRINGE
PREFILLED_SYRINGE | INTRAVENOUS | Status: AC
  Filled 2019-12-23: qty 10

## 2019-12-23 MED ORDER — TOPIRAMATE 25 MG PO TABS
50.0000 mg | ORAL_TABLET | Freq: Every day | ORAL | Status: DC
  Administered 2019-12-24 – 2019-12-26 (×3): 50 mg via ORAL
  Filled 2019-12-23 (×3): qty 2

## 2019-12-23 MED ORDER — ASPIRIN 81 MG PO CHEW
81.0000 mg | CHEWABLE_TABLET | Freq: Two times a day (BID) | ORAL | Status: DC
  Administered 2019-12-23 – 2019-12-26 (×6): 81 mg via ORAL
  Filled 2019-12-23 (×6): qty 1

## 2019-12-23 MED ORDER — SODIUM CHLORIDE (PF) 0.9 % IJ SOLN
INTRAMUSCULAR | Status: DC | PRN
  Administered 2019-12-23: 20 mL

## 2019-12-23 MED ORDER — PHENOL 1.4 % MT LIQD: 1.0000 | OROMUCOSAL | Status: DC | PRN

## 2019-12-23 MED ORDER — MIDAZOLAM HCL 2 MG/2ML IJ SOLN
INTRAMUSCULAR | Status: DC | PRN
  Administered 2019-12-23: 2 mg via INTRAVENOUS

## 2019-12-23 MED ORDER — HYDROMORPHONE HCL 1 MG/ML IJ SOLN: 0.5000 mg | INTRAMUSCULAR | Status: DC | PRN

## 2019-12-23 MED ORDER — ACETAMINOPHEN 500 MG PO TABS
1000.0000 mg | ORAL_TABLET | Freq: Four times a day (QID) | ORAL | Status: AC
  Administered 2019-12-23 – 2019-12-24 (×4): 1000 mg via ORAL
  Filled 2019-12-23 (×4): qty 2

## 2019-12-23 MED ORDER — ALBUMIN HUMAN 5 % IV SOLN
INTRAVENOUS | Status: DC | PRN
Start: 2019-12-23 — End: 2019-12-23

## 2019-12-23 MED ORDER — ONDANSETRON HCL 4 MG/2ML IJ SOLN
INTRAMUSCULAR | Status: DC | PRN
  Administered 2019-12-23: 4 mg via INTRAVENOUS

## 2019-12-23 MED ORDER — LACTATED RINGERS IV SOLN: INTRAVENOUS | Status: DC

## 2019-12-23 MED ORDER — LIDOCAINE 2% (20 MG/ML) 5 ML SYRINGE
INTRAMUSCULAR | Status: DC | PRN
  Administered 2019-12-23: 100 mg via INTRAVENOUS

## 2019-12-23 MED ORDER — PROPOFOL 10 MG/ML IV BOLUS
INTRAVENOUS | Status: DC | PRN
  Administered 2019-12-23: 200 mg via INTRAVENOUS

## 2019-12-23 MED ORDER — PIPERACILLIN-TAZOBACTAM 3.375 G IVPB
3.3750 g | Freq: Three times a day (TID) | INTRAVENOUS | Status: DC
  Administered 2019-12-23 – 2019-12-25 (×5): 3.375 g via INTRAVENOUS
  Filled 2019-12-23 (×5): qty 50

## 2019-12-23 MED ORDER — ONDANSETRON HCL 4 MG/2ML IJ SOLN
4.0000 mg | Freq: Four times a day (QID) | INTRAMUSCULAR | Status: DC | PRN
  Administered 2019-12-23: 4 mg via INTRAVENOUS
  Filled 2019-12-23: qty 2

## 2019-12-23 MED ORDER — MIDAZOLAM HCL 2 MG/2ML IJ SOLN
INTRAMUSCULAR | Status: AC
  Filled 2019-12-23: qty 2

## 2019-12-23 MED ORDER — HYDROMORPHONE HCL 1 MG/ML IJ SOLN
INTRAMUSCULAR | Status: AC
  Filled 2019-12-23: qty 1

## 2019-12-23 MED ORDER — VANCOMYCIN HCL IN DEXTROSE 1-5 GM/200ML-% IV SOLN
INTRAVENOUS | Status: AC
  Filled 2019-12-23: qty 200

## 2019-12-23 MED ORDER — BUPIVACAINE HCL (PF) 0.25 % IJ SOLN
INTRAMUSCULAR | Status: DC | PRN
  Administered 2019-12-23: 30 mL

## 2019-12-23 MED ORDER — VANCOMYCIN HCL 750 MG/150ML IV SOLN
750.0000 mg | Freq: Two times a day (BID) | INTRAVENOUS | Status: DC
  Administered 2019-12-24 – 2019-12-26 (×5): 750 mg via INTRAVENOUS
  Filled 2019-12-23 (×6): qty 150

## 2019-12-23 MED ORDER — DEXAMETHASONE SODIUM PHOSPHATE 10 MG/ML IJ SOLN
INTRAMUSCULAR | Status: AC
  Filled 2019-12-23: qty 1

## 2019-12-23 MED ORDER — VANCOMYCIN HCL 1000 MG IV SOLR: INTRAVENOUS | Status: DC | PRN

## 2019-12-23 MED ORDER — INSULIN ASPART 100 UNIT/ML ~~LOC~~ SOLN
0.0000 [IU] | Freq: Three times a day (TID) | SUBCUTANEOUS | Status: DC
  Administered 2019-12-24 (×2): 4 [IU] via SUBCUTANEOUS
  Administered 2019-12-24: 3 [IU] via SUBCUTANEOUS
  Administered 2019-12-25: 4 [IU] via SUBCUTANEOUS
  Administered 2019-12-25: 3 [IU] via SUBCUTANEOUS

## 2019-12-23 MED ORDER — OXYCODONE HCL ER 10 MG PO T12A
10.0000 mg | EXTENDED_RELEASE_TABLET | Freq: Two times a day (BID) | ORAL | Status: DC
  Administered 2019-12-23 – 2019-12-26 (×6): 10 mg via ORAL
  Filled 2019-12-23 (×6): qty 1

## 2019-12-23 MED ORDER — VANCOMYCIN HCL 1000 MG IV SOLR
INTRAVENOUS | Status: AC
  Filled 2019-12-23: qty 1000

## 2019-12-23 MED ORDER — SODIUM CHLORIDE 0.9 % IR SOLN
Status: DC | PRN
Start: 2019-12-23 — End: 2019-12-23
  Administered 2019-12-23 (×2): 3000 mL

## 2019-12-23 MED ORDER — BUPIVACAINE HCL (PF) 0.25 % IJ SOLN
INTRAMUSCULAR | Status: AC
  Filled 2019-12-23: qty 30

## 2019-12-23 MED ORDER — IRRISEPT - 450ML BOTTLE WITH 0.05% CHG IN STERILE WATER, USP 99.95% OPTIME
TOPICAL | Status: DC | PRN
  Administered 2019-12-23: 450 mL

## 2019-12-23 MED ORDER — HYDROCHLOROTHIAZIDE 25 MG PO TABS
25.0000 mg | ORAL_TABLET | Freq: Every day | ORAL | Status: DC
  Administered 2019-12-24 – 2019-12-26 (×3): 25 mg via ORAL
  Filled 2019-12-23 (×3): qty 1

## 2019-12-23 MED ORDER — GABAPENTIN 300 MG PO CAPS
300.0000 mg | ORAL_CAPSULE | Freq: Three times a day (TID) | ORAL | Status: DC
  Administered 2019-12-23 – 2019-12-26 (×8): 300 mg via ORAL
  Filled 2019-12-23 (×8): qty 1

## 2019-12-23 MED ORDER — SUGAMMADEX SODIUM 200 MG/2ML IV SOLN
INTRAVENOUS | Status: DC | PRN
  Administered 2019-12-23: 200 mg via INTRAVENOUS

## 2019-12-23 MED ORDER — BUPIVACAINE LIPOSOME 1.3 % IJ SUSP
INTRAMUSCULAR | Status: DC | PRN
  Administered 2019-12-23: 20 mL

## 2019-12-23 MED ORDER — EPINEPHRINE PF 1 MG/ML IJ SOLN
INTRAMUSCULAR | Status: AC
  Filled 2019-12-23: qty 1

## 2019-12-23 MED ORDER — DIPHENHYDRAMINE HCL 12.5 MG/5ML PO ELIX: 25.0000 mg | ORAL_SOLUTION | ORAL | Status: DC | PRN

## 2019-12-23 MED ORDER — METOCLOPRAMIDE HCL 5 MG/ML IJ SOLN: 5.0000 mg | Freq: Three times a day (TID) | INTRAMUSCULAR | Status: DC | PRN

## 2019-12-23 MED ORDER — DOCUSATE SODIUM 100 MG PO CAPS
100.0000 mg | ORAL_CAPSULE | Freq: Two times a day (BID) | ORAL | Status: DC
  Administered 2019-12-23 – 2019-12-26 (×6): 100 mg via ORAL
  Filled 2019-12-23 (×6): qty 1

## 2019-12-23 MED ORDER — PROPOFOL 500 MG/50ML IV EMUL
INTRAVENOUS | Status: DC | PRN
Start: 2019-12-23 — End: 2019-12-23
  Administered 2019-12-23: 50 ug/kg/min via INTRAVENOUS

## 2019-12-23 MED ORDER — MAGNESIUM CITRATE PO SOLN: 1.0000 | Freq: Once | ORAL | Status: DC | PRN

## 2019-12-23 MED ORDER — FERROUS SULFATE 325 (65 FE) MG PO TABS
325.0000 mg | ORAL_TABLET | Freq: Two times a day (BID) | ORAL | Status: DC
  Administered 2019-12-23 – 2019-12-26 (×6): 325 mg via ORAL
  Filled 2019-12-23 (×6): qty 1

## 2019-12-23 MED ORDER — TRANEXAMIC ACID 1000 MG/10ML IV SOLN: INTRAVENOUS | Status: DC | PRN

## 2019-12-23 MED ORDER — VANCOMYCIN HCL 1000 MG IV SOLR
INTRAVENOUS | Status: DC | PRN
  Administered 2019-12-23: 1000 mg

## 2019-12-23 MED ORDER — SODIUM CHLORIDE 0.9 % IV SOLN: INTRAVENOUS | Status: DC

## 2019-12-23 MED ORDER — PHENYLEPHRINE HCL-NACL 10-0.9 MG/250ML-% IV SOLN
INTRAVENOUS | Status: DC | PRN
  Administered 2019-12-23: 25 ug/min via INTRAVENOUS

## 2019-12-23 MED ORDER — KETOROLAC TROMETHAMINE 15 MG/ML IJ SOLN
15.0000 mg | Freq: Four times a day (QID) | INTRAMUSCULAR | Status: AC
  Administered 2019-12-23 – 2019-12-24 (×4): 15 mg via INTRAVENOUS
  Filled 2019-12-23 (×4): qty 1

## 2019-12-23 MED ORDER — ROCURONIUM BROMIDE 10 MG/ML (PF) SYRINGE
PREFILLED_SYRINGE | INTRAVENOUS | Status: DC | PRN
  Administered 2019-12-23: 80 mg via INTRAVENOUS

## 2019-12-23 MED ORDER — MINERAL OIL LIGHT OIL
TOPICAL_OIL | Freq: Once | Status: DC
  Filled 2019-12-23: qty 10

## 2019-12-23 MED ORDER — OXYCODONE HCL 5 MG PO TABS
5.0000 mg | ORAL_TABLET | ORAL | Status: DC | PRN
  Administered 2019-12-23: 5 mg via ORAL
  Administered 2019-12-24: 10 mg via ORAL
  Filled 2019-12-23 (×2): qty 2

## 2019-12-23 MED ORDER — FENTANYL CITRATE (PF) 100 MCG/2ML IJ SOLN
INTRAMUSCULAR | Status: DC | PRN
  Administered 2019-12-23: 50 ug via INTRAVENOUS
  Administered 2019-12-23: 100 ug via INTRAVENOUS
  Administered 2019-12-23 (×2): 50 ug via INTRAVENOUS

## 2019-12-23 MED ORDER — DEXAMETHASONE SODIUM PHOSPHATE 10 MG/ML IJ SOLN
INTRAMUSCULAR | Status: DC | PRN
  Administered 2019-12-23: 5 mg via INTRAVENOUS

## 2019-12-23 MED ORDER — LIDOCAINE 2% (20 MG/ML) 5 ML SYRINGE
INTRAMUSCULAR | Status: AC
  Filled 2019-12-23: qty 5

## 2019-12-23 MED ORDER — METOPROLOL TARTRATE 25 MG PO TABS
25.0000 mg | ORAL_TABLET | Freq: Two times a day (BID) | ORAL | Status: DC
  Administered 2019-12-23 – 2019-12-26 (×6): 25 mg via ORAL
  Filled 2019-12-23 (×6): qty 1

## 2019-12-23 MED ORDER — METOCLOPRAMIDE HCL 5 MG PO TABS: 5.0000 mg | ORAL_TABLET | Freq: Three times a day (TID) | ORAL | Status: DC | PRN

## 2019-12-23 MED ORDER — VANCOMYCIN HCL IN DEXTROSE 1-5 GM/200ML-% IV SOLN
1000.0000 mg | Freq: Once | INTRAVENOUS | Status: AC
  Administered 2019-12-23: 1000 mg via INTRAVENOUS
  Filled 2019-12-23: qty 200

## 2019-12-23 MED ORDER — CEFAZOLIN SODIUM-DEXTROSE 2-4 GM/100ML-% IV SOLN: 2.0000 g | Freq: Four times a day (QID) | INTRAVENOUS | Status: DC

## 2019-12-23 MED ORDER — ALUM & MAG HYDROXIDE-SIMETH 200-200-20 MG/5ML PO SUSP: 30.0000 mL | ORAL | Status: DC | PRN

## 2019-12-23 MED ORDER — ONDANSETRON HCL 4 MG/2ML IJ SOLN: 4.0000 mg | Freq: Once | INTRAMUSCULAR | Status: DC | PRN

## 2019-12-23 MED ORDER — OXYCODONE HCL 5 MG PO TABS
ORAL_TABLET | ORAL | Status: AC
  Filled 2019-12-23: qty 1

## 2019-12-23 MED ORDER — MEPERIDINE HCL 25 MG/ML IJ SOLN: 6.2500 mg | INTRAMUSCULAR | Status: DC | PRN

## 2019-12-23 MED ORDER — TOBRAMYCIN SULFATE 1.2 G IJ SOLR
INTRAMUSCULAR | Status: DC | PRN
  Administered 2019-12-23: 1.2 g

## 2019-12-23 MED ORDER — ZINC GLUCONATE 50 MG PO TABS: 50.0000 mg | ORAL_TABLET | Freq: Every day | ORAL | Status: DC

## 2019-12-23 MED ORDER — POLYETHYLENE GLYCOL 3350 17 G PO PACK
17.0000 g | PACK | Freq: Every day | ORAL | Status: DC | PRN
  Administered 2019-12-24 – 2019-12-25 (×2): 17 g via ORAL
  Filled 2019-12-23 (×2): qty 1

## 2019-12-23 MED ORDER — OXYCODONE HCL 5 MG PO TABS
10.0000 mg | ORAL_TABLET | ORAL | Status: DC | PRN
  Administered 2019-12-25: 10 mg via ORAL

## 2019-12-23 MED ORDER — FENTANYL CITRATE (PF) 250 MCG/5ML IJ SOLN
INTRAMUSCULAR | Status: AC
  Filled 2019-12-23: qty 5

## 2019-12-23 MED ORDER — ONDANSETRON HCL 4 MG/2ML IJ SOLN
INTRAMUSCULAR | Status: AC
  Filled 2019-12-23: qty 2

## 2019-12-23 MED ORDER — ZINC SULFATE 220 (50 ZN) MG PO CAPS
220.0000 mg | ORAL_CAPSULE | Freq: Every day | ORAL | Status: DC
  Administered 2019-12-24 – 2019-12-26 (×3): 220 mg via ORAL
  Filled 2019-12-23 (×3): qty 1

## 2019-12-23 MED ORDER — SODIUM CHLORIDE 0.9 % IV SOLN
INTRAVENOUS | Status: AC | PRN
  Administered 2019-12-23: 1000 mL via INTRAMUSCULAR

## 2019-12-23 MED ORDER — TOBRAMYCIN SULFATE 1.2 G IJ SOLR
INTRAMUSCULAR | Status: AC
  Filled 2019-12-23: qty 1.2

## 2019-12-23 MED ORDER — BUPIVACAINE LIPOSOME 1.3 % IJ SUSP
20.0000 mL | Freq: Once | INTRAMUSCULAR | Status: DC
  Filled 2019-12-23: qty 20

## 2019-12-23 MED ORDER — INSULIN ASPART 100 UNIT/ML ~~LOC~~ SOLN: 0.0000 [IU] | Freq: Every day | SUBCUTANEOUS | Status: DC

## 2019-12-23 SURGICAL SUPPLY — 74 items
BLADE CLIPPER SURG (BLADE) ×3 IMPLANT
BOWL SMART MIX CTS (DISPOSABLE) ×2 IMPLANT
BRUSH FEMORAL CANAL (MISCELLANEOUS) IMPLANT
CANISTER WOUNDNEG PRESSURE 500 (CANNISTER) ×2 IMPLANT
CEMENT BONE REFOBACIN R1X40 US (Cement) IMPLANT
COVER SURGICAL LIGHT HANDLE (MISCELLANEOUS) ×3 IMPLANT
COVER WAND RF STERILE (DRAPES) ×3 IMPLANT
DRAPE HALF SHEET 40X57 (DRAPES) ×6 IMPLANT
DRAPE HIP W/POCKET STRL (MISCELLANEOUS) ×3 IMPLANT
DRAPE INCISE IOBAN 66X45 STRL (DRAPES) ×5 IMPLANT
DRAPE ORTHO SPLIT 77X108 STRL (DRAPES) ×6
DRAPE POUCH INSTRU U-SHP 10X18 (DRAPES) ×2 IMPLANT
DRAPE SURG ORHT 6 SPLT 77X108 (DRAPES) ×2 IMPLANT
DRAPE U-SHAPE 47X51 STRL (DRAPES) ×3 IMPLANT
DRSG AQUACEL AG ADV 3.5X10 (GAUZE/BANDAGES/DRESSINGS) IMPLANT
DRSG AQUACEL AG ADV 3.5X14 (GAUZE/BANDAGES/DRESSINGS) IMPLANT
DRSG MEPITEL 4X7.2 (GAUZE/BANDAGES/DRESSINGS) ×2 IMPLANT
DRSG VAC ATS LRG SENSATRAC (GAUZE/BANDAGES/DRESSINGS) ×2 IMPLANT
DRSG VAC ATS MED SENSATRAC (GAUZE/BANDAGES/DRESSINGS) ×2 IMPLANT
DURAPREP 26ML APPLICATOR (WOUND CARE) ×9 IMPLANT
ELECT BLADE 6.5 EXT (BLADE) IMPLANT
ELECT CAUTERY BLADE 6.4 (BLADE) IMPLANT
ELECT REM PT RETURN 9FT ADLT (ELECTROSURGICAL) ×3
ELECTRODE REM PT RTRN 9FT ADLT (ELECTROSURGICAL) ×1 IMPLANT
EVACUATOR 1/8 PVC DRAIN (DRAIN) IMPLANT
FILTER STRAW FLUID ASPIR (MISCELLANEOUS) ×3 IMPLANT
GLOVE BIOGEL PI IND STRL 7.0 (GLOVE) ×1 IMPLANT
GLOVE BIOGEL PI INDICATOR 7.0 (GLOVE) ×2
GLOVE ECLIPSE 6.5 STRL STRAW (GLOVE) ×6 IMPLANT
GLOVE SKINSENSE NS SZ7.5 (GLOVE) ×2
GLOVE SKINSENSE STRL SZ7.5 (GLOVE) ×1 IMPLANT
GLOVE SURG SYN 7.5  E (GLOVE) ×6
GLOVE SURG SYN 7.5 E (GLOVE) ×2 IMPLANT
GLOVE SURG SYN 7.5 PF PI (GLOVE) ×2 IMPLANT
HANDPIECE INTERPULSE COAX TIP (DISPOSABLE)
HEAD M SROM 36MM PLUS 1.5 (Hips) IMPLANT
HOOD PEEL AWAY FLYTE STAYCOOL (MISCELLANEOUS) ×9 IMPLANT
IMMOBILIZER KNEE 22 UNIV (SOFTGOODS) ×2 IMPLANT
KIT BASIN OR (CUSTOM PROCEDURE TRAY) ×3 IMPLANT
KIT TURNOVER KIT B (KITS) ×3 IMPLANT
LINER NEUTRAL 52X36MM PLUS 4 (Liner) ×2 IMPLANT
MANIFOLD NEPTUNE II (INSTRUMENTS) ×3 IMPLANT
NDL SPNL 18GX3.5 QUINCKE PK (NEEDLE) ×1 IMPLANT
NEEDLE SPNL 18GX3.5 QUINCKE PK (NEEDLE) ×3 IMPLANT
NS IRRIG 1000ML POUR BTL (IV SOLUTION) ×3 IMPLANT
PACK TOTAL JOINT (CUSTOM PROCEDURE TRAY) ×3 IMPLANT
PAD ARMBOARD 7.5X6 YLW CONV (MISCELLANEOUS) ×6 IMPLANT
PASSER SUT SWANSON 36MM LOOP (INSTRUMENTS) ×3 IMPLANT
PENCIL SMOKE EVACUATOR (MISCELLANEOUS) ×2 IMPLANT
SEALER BIPOLAR AQUA 6.0 (INSTRUMENTS) ×2 IMPLANT
SET HNDPC FAN SPRY TIP SCT (DISPOSABLE) IMPLANT
SPONGE LAP 18X18 RF (DISPOSABLE) IMPLANT
SROM M HEAD 36MM PLUS 1.5 (Hips) ×3 IMPLANT
STAPLER SKIN PROX 35W (STAPLE) ×2 IMPLANT
STAPLER VISISTAT 35W (STAPLE) IMPLANT
SUT ETHIBOND NAB CT1 #1 30IN (SUTURE) ×3 IMPLANT
SUT ETHILON 2 0 PSLX (SUTURE) ×2 IMPLANT
SUT MON AB 2-0 CT1 36 (SUTURE) ×8 IMPLANT
SUT PDS AB 0 CT 36 (SUTURE) ×3 IMPLANT
SUT PDS AB 0 CT1 27 (SUTURE) ×4 IMPLANT
SUT PDS AB 1 CT  36 (SUTURE) ×9
SUT PDS AB 1 CT 36 (SUTURE) ×1 IMPLANT
SUT VIC AB 1 CT1 27 (SUTURE) ×6
SUT VIC AB 1 CT1 27XBRD ANTBC (SUTURE) ×2 IMPLANT
SUT VIC AB 2-0 CT1 27 (SUTURE) ×6
SUT VIC AB 2-0 CT1 TAPERPNT 27 (SUTURE) ×2 IMPLANT
SYR 50ML LL SCALE MARK (SYRINGE) ×3 IMPLANT
SYR TB 1ML LUER SLIP (SYRINGE) ×3 IMPLANT
TOWEL GREEN STERILE (TOWEL DISPOSABLE) ×3 IMPLANT
TOWEL GREEN STERILE FF (TOWEL DISPOSABLE) ×3 IMPLANT
TOWER CARTRIDGE SMART MIX (DISPOSABLE) IMPLANT
TRAY FOL W/BAG SLVR 16FR STRL (SET/KITS/TRAYS/PACK) IMPLANT
TRAY FOLEY W/BAG SLVR 16FR LF (SET/KITS/TRAYS/PACK)
WATER STERILE IRR 1000ML POUR (IV SOLUTION) ×9 IMPLANT

## 2019-12-23 NOTE — Anesthesia Preprocedure Evaluation (Addendum)
Anesthesia Evaluation  Patient identified by MRN, date of birth, ID band Patient awake    Reviewed: Allergy & Precautions, NPO status , Patient's Chart, lab work & pertinent test results, reviewed documented beta blocker date and time   History of Anesthesia Complications Negative for: history of anesthetic complications  Airway Mallampati: II  TM Distance: >3 FB Neck ROM: Full    Dental  (+) Poor Dentition, Missing   Pulmonary pneumonia, resolved, former smoker,    Pulmonary exam normal breath sounds clear to auscultation       Cardiovascular hypertension, Pt. on medications and Pt. on home beta blockers (-) CHF Normal cardiovascular exam Rhythm:Regular Rate:Normal     Neuro/Psych  Headaches, Seizures -,  PSYCHIATRIC DISORDERS Anxiety Depression    GI/Hepatic Neg liver ROS, PUD, GERD  Medicated and Controlled,  Endo/Other  diabetes, Well Controlled, Type 2Morbid obesity  Renal/GU Renal InsufficiencyRenal diseaseRenal sclerosis Solitary kidney  negative genitourinary   Musculoskeletal  (+) Arthritis , Infected left total hip replacement   Abdominal (+) + obese,   Peds  Hematology  (+) anemia ,   Anesthesia Other Findings   Reproductive/Obstetrics                             Anesthesia Physical  Anesthesia Plan  ASA: III  Anesthesia Plan: Spinal   Post-op Pain Management:    Induction:   PONV Risk Score and Plan: 3 and Midazolam, Ondansetron, Treatment may vary due to age or medical condition and Dexamethasone  Airway Management Planned: Oral ETT  Additional Equipment: None  Intra-op Plan:   Post-operative Plan:   Informed Consent: I have reviewed the patients History and Physical, chart, labs and discussed the procedure including the risks, benefits and alternatives for the proposed anesthesia with the patient or authorized representative who has indicated his/her  understanding and acceptance.     Dental advisory given  Plan Discussed with: CRNA and Surgeon  Anesthesia Plan Comments:        Anesthesia Quick Evaluation

## 2019-12-23 NOTE — Progress Notes (Signed)
Pharmacy Antibiotic Note  Kristin [name] is a 64 y.o. female admitted on 12/23/2019 with hip infection.  Pharmacy has been consulted for vancomycin and zosyn dosing.  Received 1g of vancomycin and 3.375g of Zosyn ~ 2 pm today.  Plan: Give additional 1g of vancomycin now, then 750 mg IV q 12 hrs. Zosyn 3.375g IV q 8 hrs. F/u cultures, renal function and clinical course.   Height: 5\' 2"  (157.5 cm) Weight: 110.4 kg (243 lb 6.2 oz) IBW/kg (Calculated) : 50.1  Temp (24hrs), Avg:97.8 F (36.6 C), Min:97.5 F (36.4 C), Max:98.4 F (36.9 C)  Recent Labs  Lab 12/18/19 1319  WBC 5.0  CREATININE 1.11*    Estimated Creatinine Clearance: 61.6 mL/min (A) (by C-G formula based on SCr of 1.11 mg/dL (H)).    Allergies  Allergen Reactions  . Fluconazole Swelling and Other (See Comments)    Face swelling  . Robitussin (Alcohol Free) [Guaifenesin] Palpitations and Other (See Comments)    Chest pain    Antimicrobials this admission:  Vancomycin 6/21 >  Zosyn 6/21 >   Dose adjustments this admission:    Microbiology results:  6/21 Hip abscess >   Thank you for allowing pharmacy to be a part of this patient's care.  [place] Crane, Roylene Reason, BCCP Clinical Pharmacist  12/23/2019 7:06 PM   [facility] pharmacy phone numbers are listed on [email or web address]

## 2019-12-23 NOTE — Anesthesia Procedure Notes (Signed)
Procedure Name: Intubation Date/Time: 12/23/2019 1:16 PM Performed by: Barrington Ellison, CRNA Pre-anesthesia Checklist: Patient identified, Emergency Drugs available, Suction available and Patient being monitored Patient Re-evaluated:Patient Re-evaluated prior to induction Oxygen Delivery Method: Circle System Utilized Preoxygenation: Pre-oxygenation with 100% oxygen Induction Type: IV induction Ventilation: Mask ventilation without difficulty Laryngoscope Size: Mac and 3 Grade View: Grade I Tube type: Oral Tube size: 7.0 mm Number of attempts: 1 Airway Equipment and Method: Stylet and Oral airway Placement Confirmation: ETT inserted through vocal cords under direct vision,  positive ETCO2 and breath sounds checked- equal and bilateral Secured at: 21 cm Tube secured with: Tape Dental Injury: Teeth and Oropharynx as per pre-operative assessment

## 2019-12-23 NOTE — Op Note (Signed)
Date of Surgery: 12/23/2019  INDICATIONS: Kristin [name] is a 64 y.o.-year-old female with a a complicated history regarding her left total hip replacement.  She has had a chronic recurring wound on the anterior aspect of her thigh from the original surgical incision.  This has been unsuccessfully treated by plastic surgery as well.  Given the preoperative work-up the suspicion for a deep prosthetic joint infection was high.  I discussed the need for exploration and likely resection arthroplasty and placement of antibiotic spacer in the operating room.  The patient did consent to the procedure after discussion of the risks and benefits.  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  PROCEDURE:  1.  Irrigation and excisional debridement of chronic left hip wound including skin, subcutaneous tissue, muscle, fascia 100 cm 2.  Exchange of left femoral head and left acetabular polyethylene liner 3.  Adjacent tissue rearrangement left thigh 5 cm 4.  Application of incisional wound VAC  SURGEON: N. Eduard Roux, M.D.  ASSIST: Ky Barban, RNFA  ANESTHESIA:  general  IV FLUIDS AND URINE: See anesthesia.  ESTIMATED BLOOD LOSS: 500 mL.  DRAINS: None  COMPLICATIONS: see description of procedure.  DESCRIPTION OF PROCEDURE: The patient was brought to the operating room.  The patient had been signed prior to the procedure and this was documented. The patient had the anesthesia placed by the anesthesiologist.  A time-out was performed to confirm that this was the correct patient, site, side and location.  Antibiotics were held until intraoperative cultures were obtained.  The patient was placed in the right lateral decubitus position on the pegboard with all bony prominences well-padded.  The patient had the operative extremity prepped and draped in the standard surgical fashion.    I made an incision on the posterior lateral aspect of the hip and  a standard posterior lateral approach to the hip was utilized.  Dissection was carried down to the IT band which was sharply incised in line with the incision.   Old scar tissue was removed deep to the fascia in order to expose the posterior hip joint.  The posterior hip capsule with the short external rotators were peeled off of the back of the proximal femur in a full-thickness fashion.  There was return of murky yellowish fluid which was cultured and sent for stat Gram stain.  Synovium from the joint was removed and sent for frozen section.  We continued our dissection in order to achieve adequate exposure.  The hip joint was then gently dislocated and the femoral head was standing off.  Soft tissues were cleared from the proximal femur as well as the acetabulum.  Taking around the proximal shoulder of the implant.  I then assessed the bony fixation of both the femoral and the acetabular component.  I was not able to remove the femoral stem or the acetabular cup as both were well grown in.  I removed the polyethylene liner and took more cultures from the backside as well as the tissue from the screw holes.  The entire wound was then thoroughly irrigated with 6 L of normal saline.  Frozen section came back as less than 10 neutrophils per high-power field and the stat Gram stain did not show any organisms.  I then turned my attention to excisional debridement of the chronic wound on the thigh from the anterior hip incision.  Sharply excised the draining sinus in a full-thickness fashion taking the skin and underlying subcutaneous tissue and  muscle and fascia.  I followed the sinus all the way down to the hip joint with my finger.  This was then thoroughly irrigated with another 6 L of normal saline.  The instruments used on the hip wound were discarded after use.  Due to the lack of conclusive evidence of a deep prosthetic infection I decided not to remove the femoral stem with acetabular cup but did just exchange  the femoral head and the polyethylene liner for now given her in the hospital on IV antibiotics while waiting to wait for the cultures to speciate.  A new lateralized polyethylene liner as well as a new cobalt chromium 36 mm +1.5 head were placed back.  The joint was reduced.  The soft tissues of the posterior hip incision were closed in layered fashion with #1 PDS, 0 PDS, 2-0 Monocryl, staples.  In order to close the defect left after the debridement of the chronic wound and adjacent tissue rearrangement had to be performed of approximately 5 cm.  Incisional VAC was placed on both incisions.  Knee immobilizer was placed.  Patient tolerated the procedure well had no immediate complications.  Azucena Cecil, MD 7:59 PM

## 2019-12-23 NOTE — Anesthesia Postprocedure Evaluation (Signed)
Anesthesia Post Note  Patient: Kristin [name]  Procedure(s) Performed: LEFT HIP IRRIGATION AND DEBRIDEMENT LEFT HIP WOUND and poly exchange  (Left Hip) APPLICATION OF WOUND VAC (Left Hip)     Patient location during evaluation: PACU Anesthesia Type: Spinal Level of consciousness: awake Pain management: pain level controlled Vital Signs Assessment: post-procedure vital signs reviewed and stable Respiratory status: spontaneous breathing Cardiovascular status: stable Postop Assessment: no apparent nausea or vomiting Anesthetic complications: no   No complications documented.  Last Vitals:  Vitals:   12/23/19 1635 12/23/19 1650  BP: 122/71 137/78  Pulse: 93 89  Resp: 18 20  Temp: 36.4 C   SpO2: 100% 100%    Last Pain:  Vitals:   12/23/19 1705  PainSc: Asleep                 [name]

## 2019-12-23 NOTE — Transfer of Care (Signed)
Immediate Anesthesia Transfer of Care Note  Patient: Kristin [name]  Procedure(s) Performed: LEFT HIP IRRIGATION AND DEBRIDEMENT LEFT HIP WOUND and poly exchange  (Left Hip) APPLICATION OF WOUND VAC (Left Hip)  Patient Location: PACU  Anesthesia Type:General  Level of Consciousness: drowsy and patient cooperative  Airway & Oxygen Therapy: Patient Spontanous Breathing and Patient connected to face mask oxygen  Post-op Assessment: Report given to RN  Post vital signs: Reviewed and stable  Last Vitals:  Vitals Value Taken Time  BP 122/71 12/23/19 1633  Temp    Pulse 93 12/23/19 1634  Resp 18 12/23/19 1634  SpO2 100 % 12/23/19 1634  Vitals shown include unvalidated device data.  Last Pain:  Vitals:   12/23/19 1048  PainSc: 0-No pain         Complications: No complications documented.

## 2019-12-23 NOTE — H&P (Signed)
PREOPERATIVE H&P  Chief Complaint: chronic left total hip infection  HPI: Kristin [name] is a 64 y.o. female who presents for surgical treatment of chronic left total hip infection.  She denies any changes in medical history.  Past Medical History:  Diagnosis Date  . Allergy   . Anemia   . Anxiety   . Arthritis   . Back pain, chronic   . Borderline diabetes   . Bronchitis   . Constipation   . Cough   . Depression   . Epilepsy ([place])   . GERD (gastroesophageal reflux disease)   . Hyperlipidemia   . Hypertension   . IBS (irritable bowel syndrome)   . Joint pain   . Obesity   . Palpitations   . Pneumonia 2016  . Pre-diabetes   . 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 Rheumatoid arthritis ([place])   . Seizures ([place])    last >10 yrs ago- recorded 01/18/18  . Somatization disorder   . Tobacco abuse    Past Surgical History:  Procedure Laterality Date  . ABDOMINAL HYSTERECTOMY    . ANTERIOR HIP REVISION Left 07/25/2016   Procedure: LEFT HIP IRRIGATION AND DEBRIDEMENT, REVISION OF HEAD AND LINER;  Surgeon: Leandrew Koyanagi, MD;  Location: [place];  Service: Orthopedics;  Laterality: Left;  . BREAST EXCISIONAL BIOPSY Right   . CHOLECYSTECTOMY    . COLONOSCOPY    . DEBRIDEMENT AND CLOSURE WOUND Left 07/17/2019   Procedure: Excision of left leg wound with ACell placement and primary closure;  Surgeon: Wallace Going, DO;  Location: [facility];  Service: Plastics;  Laterality: Left;  60 min  . HIP DEBRIDEMENT Left 01/19/2018   Procedure(s) Performed: IRRIGATION AND DEBRIDEMENT LEFT HIP (Left )  . INCISION AND DRAINAGE HIP Left 08/03/2016   Procedure: IRRIGATION AND DEBRIDEMENT LEFT HIP, VAC PLACEMENT;  Surgeon: Leandrew Koyanagi, MD;  Location: [facility];  Service: Orthopedics;  Laterality: Left;  . INCISION AND DRAINAGE HIP Left 01/19/2018   Procedure: IRRIGATION AND DEBRIDEMENT LEFT HIP;  Surgeon: Leandrew Koyanagi, MD;  Location: [facility];  Service: Orthopedics;   Laterality: Left;  . kidney stent Right 2002  . TONSILLECTOMY Bilateral 12/06/2014   Procedure: TONSILLECTOMY, BILATERAL;  Surgeon: Ruby Cola, MD;  Location: [facility];  Service: ENT;  Laterality: Bilateral;  . TONSILLECTOMY    . TOTAL HIP ARTHROPLASTY Left 06/01/2016   Procedure: LEFT TOTAL HIP ARTHROPLASTY ANTERIOR APPROACH;  Surgeon: Leandrew Koyanagi, MD;  Location: [place] [facility];  Service: Orthopedics;  Laterality: Left;  . TUBAL LIGATION    . WOUND EXPLORATION Left 01/19/2018   Procedure: WOUND EXPLORATION;  Surgeon: Leandrew Koyanagi, MD;  Location: [facility];  Service: Orthopedics;  Laterality: Left;   Social History   Socioeconomic History  . Marital status: Married    Spouse name: Herbie Baltimore   . Number of children: 3  . Years of education: Not on file  . Highest education level: 12th grade  Occupational History  . Occupation: Not Working  Tobacco Use  . Smoking status: Former Smoker    Packs/day: 0.25    Years: 0.50    Pack years: 0.12    Types: Cigarettes    Quit date: 01/27/2014    Years since quitting: 5.9  . Smokeless tobacco: Never Used  Vaping Use  . Vaping Use: Never used  Substance and Sexual Activity  . Alcohol use: No  . Drug use: No  .  Sexual activity: Not on file  Other Topics Concern  . Not on file  Social History Narrative  . Not on file   Social Determinants of Health   Financial Resource Strain:   . Difficulty of Paying Living Expenses:   Food Insecurity:   . Worried About Charity fundraiser in the Last Year:   . Arboriculturist in the Last Year:   Transportation Needs:   . Film/video editor (Medical):   Marland Kitchen Lack of Transportation (Non-Medical):   Physical Activity:   . Days of Exercise per Week:   . Minutes of Exercise per Session:   Stress:   . Feeling of Stress :   Social Connections:   . Frequency of Communication with Friends and Family:   . Frequency of Social Gatherings with Friends and Family:   . Attends Religious Services:   . Active Member of  Clubs or Organizations:   . Attends Archivist Meetings:   Marland Kitchen Marital Status:    Family History  Problem Relation Age of Onset  . Other Mother        failed surgery  . Heart Problems Father        poss MI, not sure:per pt  . Alzheimer's disease Maternal Grandmother   . Heart disease Maternal Grandfather   . Colon cancer Maternal Uncle        not sure age of onset   Allergies  Allergen Reactions  . Fluconazole Swelling and Other (See Comments)    Face swelling  . Robitussin (Alcohol Free) [Guaifenesin] Palpitations and Other (See Comments)    Chest pain   Prior to Admission medications   Medication Sig Start Date End Date Taking? Authorizing Provider  acetaminophen (TYLENOL) 650 MG CR tablet Take 1,300 mg by mouth every 8 (eight) hours as needed for pain.   Yes [provider]  amitriptyline (ELAVIL) 75 MG tablet Take 2 tablets (150 mg total) by mouth at bedtime. 07/13/19  Yes Bonnita Hollow, MD  Ascorbic Acid (VITAMIN C) 250 MG CHEW Chew 250 mg by mouth daily.   Yes [provider]  cholecalciferol (VITAMIN D3) 25 MCG (1000 UNIT) tablet Take 1,000 Units by mouth daily.   Yes [provider]  ferrous sulfate 325 (65 FE) MG tablet Take 1 tablet (325 mg total) by mouth 2 (two) times daily. Patient taking differently: Take 325 mg by mouth daily.  11/29/16  Yes Smiley Houseman, MD  hydrochlorothiazide (HYDRODIURIL) 25 MG tablet Take 1 tablet (25 mg total) by mouth daily. 10/22/19  Yes Bonnita Hollow, MD  ibuprofen (ADVIL) 200 MG tablet Take 400 mg by mouth every 6 (six) hours as needed for moderate pain.   Yes [provider]  levocetirizine (XYZAL) 5 MG tablet Take 1 tablet (5 mg total) by mouth every evening. Patient taking differently: Take 5 mg by mouth daily as needed for allergies.  10/30/19  Yes Bonnita Hollow, MD  metFORMIN (GLUCOPHAGE) 500 MG tablet TAKE ONE TABLET BY MOUTH ONCE DAILY WITH BREAKFAST, AFTER 2 WEEKS, TAKE AN  EXTRA TABLET EACH EVENING TO GET TO 2 TABLETS DAILY. Patient taking differently: Take 500 mg by mouth 2 (two) times daily.  11/26/19  Yes Bonnita Hollow, MD  metoprolol tartrate (LOPRESSOR) 25 MG tablet Take 1 tablet (25 mg total) by mouth 2 (two) times daily. 10/22/19  Yes Bonnita Hollow, MD  omeprazole (PRILOSEC) 20 MG capsule Take 20 mg by mouth at bedtime.  Yes [provider]  simvastatin (ZOCOR) 20 MG tablet Take 1 tablet (20 mg total) by mouth at bedtime. 09/26/19  Yes Enid Derry, [place], DO  sulfamethoxazole-trimethoprim (BACTRIM DS) 800-160 MG tablet Take 1 tablet by mouth twice daily Patient taking differently: Take 1 tablet by mouth 2 (two) times daily.  11/22/19  Yes Leandrew Koyanagi, MD  topiramate (TOPAMAX) 50 MG tablet Take 1 tablet (50 mg total) by mouth daily. 12/17/19  Yes Jearld Lesch A, DO  zinc gluconate 50 MG tablet Take 50 mg by mouth daily.   Yes [provider]     Positive ROS: All other systems have been reviewed and were otherwise negative with the exception of those mentioned in the HPI and as above.  Physical Exam: General: Alert, no acute distress Cardiovascular: No pedal edema Respiratory: No cyanosis, no use of accessory musculature GI: abdomen soft Skin: No lesions in the area of chief complaint Neurologic: Sensation intact distally Psychiatric: Patient is competent for consent with normal mood and affect Lymphatic: no lymphedema  MUSCULOSKELETAL: exam stable  Assessment: chronic left total hip infection  Plan: Plan for Procedure(s): LEFT HIP RESECTION ARTHROPLASTY WITH IMPLANT OF ANTIBIOTIC SPACERS, IRRIGATION AND DEBRIDEMENT LEFT HIP WOUND  The risks benefits and alternatives were discussed with the patient including but not limited to the risks of nonoperative treatment, versus surgical intervention including infection, bleeding, nerve injury,  blood clots, cardiopulmonary complications, morbidity, mortality, among others, and they  were willing to proceed.   Preoperative templating of the joint replacement has been completed, documented, and submitted to the Operating Room personnel in order to optimize intra-operative equipment management.   Eduard Roux, MD 12/23/2019 11:59 AM

## 2019-12-24 ENCOUNTER — Encounter (HOSPITAL_COMMUNITY): Payer: Self-pay | Admitting: Orthopaedic Surgery

## 2019-12-24 ENCOUNTER — Telehealth: Payer: Self-pay

## 2019-12-24 LAB — BASIC METABOLIC PANEL
Anion gap: 6 (ref 5–15)
BUN: 12 mg/dL (ref 8–23)
CO2: 25 mmol/L (ref 22–32)
Calcium: 7.9 mg/dL — ABNORMAL LOW (ref 8.9–10.3)
Chloride: 106 mmol/L (ref 98–111)
Creatinine, Ser: 0.91 mg/dL (ref 0.44–1.00)
GFR calc Af Amer: >60 mL/min (ref >60)
GFR calc non Af Amer: >60 mL/min (ref >60)
Glucose, Bld: 118 mg/dL — ABNORMAL HIGH (ref 70–99)
Potassium: 3.9 mmol/L (ref 3.5–5.1)
Sodium: 137 mmol/L (ref 135–145)

## 2019-12-24 LAB — GLUCOSE, CAPILLARY
Glucose-Capillary: 124 mg/dL — ABNORMAL HIGH (ref 70–99)
Glucose-Capillary: 159 mg/dL — ABNORMAL HIGH (ref 70–99)
Glucose-Capillary: 181 mg/dL — ABNORMAL HIGH (ref 70–99)
Glucose-Capillary: 174 mg/dL — ABNORMAL HIGH (ref 70–99)

## 2019-12-24 LAB — CBC
HCT: 26.9 % — ABNORMAL LOW (ref 36.0–46.0)
Hemoglobin: 8.5 g/dL — ABNORMAL LOW (ref 12.0–15.0)
MCH: 29.1 pg (ref 26.0–34.0)
MCHC: 31.6 g/dL (ref 30.0–36.0)
MCV: 92.1 fL (ref 80.0–100.0)
Platelets: 293 10*3/uL (ref 150–400)
RBC: 2.92 MIL/uL — ABNORMAL LOW (ref 3.87–5.11)
RDW: 14.4 % (ref 11.5–15.5)
WBC: 7.1 10*3/uL (ref 4.0–10.5)
nRBC: 0 % (ref 0.0–0.2)

## 2019-12-24 NOTE — Plan of Care (Signed)
  Problem: Education: Goal: Knowledge of General Education information will improve Description: Including pain rating scale, medication(s)/side effects and non-pharmacologic comfort measures 12/24/2019 0104 by Josepha Pigg, RN Outcome: Progressing 12/24/2019 0103 by Josepha Pigg, RN Outcome: Progressing   Problem: Health Behavior/Discharge Planning: Goal: Ability to manage health-related needs will improve 12/24/2019 0104 by Josepha Pigg, RN Outcome: Progressing 12/24/2019 0103 by Josepha Pigg, RN Outcome: Progressing   Problem: Clinical Measurements: Goal: Ability to maintain clinical measurements within normal limits will improve 12/24/2019 0104 by Josepha Pigg, RN Outcome: Progressing 12/24/2019 0103 by Josepha Pigg, RN Outcome: Progressing Goal: Will remain free from infection 12/24/2019 0104 by Josepha Pigg, RN Outcome: Progressing 12/24/2019 0103 by Josepha Pigg, RN Outcome: Progressing Goal: Diagnostic test results will improve 12/24/2019 0104 by Josepha Pigg, RN Outcome: Progressing 12/24/2019 0103 by Josepha Pigg, RN Outcome: Progressing Goal: Respiratory complications will improve 12/24/2019 0104 by Josepha Pigg, RN Outcome: Progressing 12/24/2019 0103 by Josepha Pigg, RN Outcome: Progressing Goal: Cardiovascular complication will be avoided 12/24/2019 0104 by Josepha Pigg, RN Outcome: Progressing 12/24/2019 0103 by Josepha Pigg, RN Outcome: Progressing   Problem: Activity: Goal: Risk for activity intolerance will decrease 12/24/2019 0104 by Josepha Pigg, RN Outcome: Progressing 12/24/2019 0103 by Josepha Pigg, RN Outcome: Progressing   Problem: Nutrition: Goal: Adequate nutrition will be maintained 12/24/2019 0104 by Josepha Pigg, RN Outcome: Progressing 12/24/2019 0103 by Josepha Pigg, RN Outcome: Progressing   Problem: Coping: Goal: Level of anxiety will  decrease 12/24/2019 0104 by Josepha Pigg, RN Outcome: Progressing 12/24/2019 0103 by Josepha Pigg, RN Outcome: Progressing   Problem: Elimination: Goal: Will not experience complications related to bowel motility 12/24/2019 0104 by Josepha Pigg, RN Outcome: Progressing 12/24/2019 0103 by Josepha Pigg, RN Outcome: Progressing Goal: Will not experience complications related to urinary retention 12/24/2019 0104 by Josepha Pigg, RN Outcome: Progressing 12/24/2019 0103 by Josepha Pigg, RN Outcome: Progressing   Problem: Pain Managment: Goal: General experience of comfort will improve 12/24/2019 0104 by Josepha Pigg, RN Outcome: Progressing 12/24/2019 0103 by Josepha Pigg, RN Outcome: Progressing   Problem: Safety: Goal: Ability to remain free from injury will improve 12/24/2019 0104 by Josepha Pigg, RN Outcome: Progressing 12/24/2019 0103 by Josepha Pigg, RN Outcome: Progressing   Problem: Skin Integrity: Goal: Risk for impaired skin integrity will decrease 12/24/2019 0104 by Josepha Pigg, RN Outcome: Progressing 12/24/2019 0103 by Josepha Pigg, RN Outcome: Progressing

## 2019-12-24 NOTE — Telephone Encounter (Signed)
I will call ID and they will determine that.  So we don't know yet.

## 2019-12-24 NOTE — Telephone Encounter (Signed)
Spoke to Teller. Would like to know if patient will need IV ABX when d/c. If so, which RX & for how long?    Call Sonia Side back to advise.   [phone].

## 2019-12-24 NOTE — Plan of Care (Signed)

## 2019-12-24 NOTE — TOC Initial Note (Signed)
Transition of Care [facility]) - Initial/Assessment Note    Patient Details  Name: Kristin [name] MRN: [id number] Date of Birth: 1956-05-01  Transition of [facility]) CM/SW Contact:    Curlene Labrum, RN Phone Number: 12/24/2019, 11:20 AM  Clinical Narrative:                 Case management met with the patient at the bedside - S/P exchange of Left femoral head and liner by [name] on 12/23/19.  Patient lives with her husband, who does not drive.  The patient currently owns a cane and will need dme through [employer] before she is discharged home.  Will continue to follow for needs after PT evaluation.  Expected Discharge Plan: [place] Barriers to Discharge: Continued Medical Work up   Patient Goals and CMS Choice Patient states their goals for this hospitalization and ongoing recovery are:: I ready to recover and get better. CMS Medicare.gov Compare Post Acute Care list provided to:: Patient Choice offered to / list presented to : Patient  Expected Discharge Plan and Services Expected Discharge Plan: [place]   Discharge Planning Services: CM Consult Post Acute Care Choice: Durable Medical Equipment Living arrangements for the past 2 months: Apartment (single level with no steps.)                   DME Agency: [employer]                  Prior Living Arrangements/Services Living arrangements for the past 2 months: Apartment (single level with no steps.) Lives with:: Spouse Patient language and need for interpreter reviewed:: Yes Do you feel safe going back to the place where you live?: Yes      Need for Family Participation in Patient Care: Yes (Comment) Care giver support system in place?: Yes (comment) Current home services: DME (currently has a cane) Criminal Activity/Legal Involvement Pertinent to Current Situation/Hospitalization: No - Comment as needed  Activities of Daily Living Home Assistive Devices/Equipment: Cane  (specify quad or straight), CBG Meter (straight) ADL Screening (condition at time of admission) Patient's cognitive ability adequate to safely complete daily activities?: Yes Is the patient deaf or have difficulty hearing?: No Does the patient have difficulty seeing, even when wearing glasses/contacts?: No Does the patient have difficulty concentrating, remembering, or making decisions?: No Patient able to express need for assistance with ADLs?: No Does the patient have difficulty dressing or bathing?: No Independently performs ADLs?: Yes (appropriate for developmental age) Does the patient have difficulty walking or climbing stairs?: Yes Weakness of Legs: Left Weakness of Arms/Hands: None  Permission Sought/Granted Permission sought to share information with : Case Manager       Permission granted to share info w AGENCY: Home Health versus OP PT - PT/OT note pending.  Permission granted to share info w Relationship: husband     Emotional Assessment Appearance:: Appears stated age Attitude/Demeanor/Rapport: Engaged Affect (typically observed): Accepting Orientation: : Oriented to Self, Oriented to Place, Oriented to  Time, Oriented to Situation Alcohol / Substance Use: Not Applicable Psych Involvement: No (comment)  Admission diagnosis:  Prosthetic joint infection of left hip [facility]) [T84.52XA] Patient Active Problem List   Diagnosis Date Noted  . Pruritus 10/30/2019  . Hypercalcemia 10/24/2019  . Body mass index 45.0-49.9, adult ([place]) 08/27/2019  . Intertrigo 07/09/2019  . Facial rash 07/27/2018  . Body mass index 40.0-44.9, adult ([place]) 02/15/2018  . Fatigue associated with anemia 02/07/2018  .  Unspecified open wound, left hip, initial encounter 01/19/2018  . Wound of left leg, sequela 10/03/2016  . 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 Status post left hip replacement 06/01/2016  . Sinus tachycardia 05/19/2016  . History of adenomatous polyp of colon  03/21/2016  . Diabetes ([place]) 01/21/2016  . Thyroid nodule 12/25/2014  . Infiltrate noted on imaging study   . Cervical spine arthritis 11/14/2014  . Primary osteoarthritis of left hip 11/14/2014  . Loss of consciousness ([place]) 11/14/2014  . Chest pain 03/26/2014  . Musculoskeletal chest and rib pain 03/26/2014  . Rib pain on right side 02/28/2014  . Groin pain 04/18/2013  . UNSPECIFIED RENAL SCLEROSIS 01/16/2009  . GERD, SEVERE 05/25/2007  . Morbid obesity ([place]) 04/03/2007  . HYPERLIPIDEMIA 08/31/2006  . Major depression, recurrent ([place]) 08/31/2006  . 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 Former smoker 08/31/2006  . HYPERTENSION, BENIGN SYSTEMIC 08/31/2006  . GASTRIC ULCER ACUTE WITHOUT HEMORRHAGE 08/31/2006  . CONVULSIONS, SEIZURES, NOS 08/31/2006   PCP:  Bonnita Hollow, MD Pharmacy:   [facility] [address] [place]), [place] - [address] [place] ([place]) [place] [address] Phone: [phone] Fax: [place], [place] - [address] [place] [address] Phone: [phone] Fax: [phone]     Social Determinants of Health (SDOH) Interventions    Readmission Risk Interventions Readmission Risk Prevention Plan 12/24/2019  Post Dischage Appt Complete  Medication Screening Complete  Transportation Screening Complete  Some recent data might be hidden

## 2019-12-24 NOTE — Progress Notes (Signed)
° °  Subjective:  Patient reports pain as mild.    Objective:   VITALS:   Vitals:   12/23/19 1836 12/23/19 1959 12/23/19 2346 12/24/19 0349  BP: (!) 142/83 125/72 112/63 113/77  Pulse: 93 93 91 92  Resp: 17 16 14 14   Temp: (!) 97.5 F (36.4 C) 97.7 F (36.5 C) 97.6 F (36.4 C) (!) 97.5 F (36.4 C)  TempSrc: Oral Oral Oral Oral  SpO2: 99% 100% 97% 98%  Weight:      Height:        Neurovascular intact Sensation intact distally Intact pulses distally Dorsiflexion/Plantar flexion intact Incision: dressing C/D/I and no drainage   Lab Results  Component Value Date   WBC 7.1 12/24/2019   HGB 8.5 (L) 12/24/2019   HCT 26.9 (L) 12/24/2019   MCV 92.1 12/24/2019   PLT 293 12/24/2019     Assessment/Plan:  1 Day Post-Op   - Expected postop acute blood loss anemia - will monitor for symptoms - Up with PT/OT - DVT ppx - SCDs, ambulation, aspirin - WBAT operative extremity - posterior hip precautions x 3 months - continue iVAC and will need to go home with prevena - cultures NGTD - continue vanc and zosyn   Eduard Roux 12/24/2019, 8:06 AM [phone]

## 2019-12-24 NOTE — Evaluation (Signed)
Physical Therapy Evaluation Patient Details Name: Kristin [name] MRN: [id number] DOB: 04-27-1956 Today's Date: 12/24/2019   History of Present Illness  64 y.o. female who presents for surgical treatment of chronic left total hip infection 12/23/19. Has had several operative procedures on L total hip since inital surgery in 2017( L THA 2017, L hip revision 07/2016). PMH RA, renal sclerosis, joint pain, HTN, HLD, epilepsy, back pain, anemia.  Clinical Impression  Pt presents with an overall decrease in functional mobility, decreased balance, increased pain and generalized weakness secondary to above. PTA, pt lives with husband and cares for grandchildren. Educ on posterior hip and WBAT precautions, pt verbalized precautions and maintained during session. Provided handout on precautions as well.  Today, pt able to complete bed mobility min(A) for assist with LE, sit to stand min guard and transfer to chair min guard with RW. Discussed d/c home with home health physical therapy and pt agreed. Pt would benefit from continued acute PT services to maximize functional mobility and independence prior to d/c to next venue of care.    Follow Up Recommendations Home health PT    Equipment Recommendations  Rolling walker with 5" wheels;3in1 (PT)    Recommendations for Other Services       Precautions / Restrictions Precautions Precautions: Posterior Hip Precaution Booklet Issued: Yes (comment) Required Braces or Orthoses: Knee Immobilizer - Left Restrictions Weight Bearing Restrictions: Yes LLE Weight Bearing: Weight bearing as tolerated      Mobility  Bed Mobility Overal bed mobility: Needs Assistance Bed Mobility: Supine to Sit     Supine to sit: Min assist     General bed mobility comments: Pt required min(A) to maintain precautions and assist with moving LE to EOB, once at EOB pt able to sit with no complaints of pain or dizziness  Transfers Overall transfer level: Needs  assistance Equipment used: Rolling walker (2 wheeled) Transfers: Stand Pivot Transfers   Stand pivot transfers: Min guard       General transfer comment: Pt able to stand pivot min guard maintaining WB, posterior precautions  Ambulation/Gait Ambulation/Gait assistance: Min guard Gait Distance (Feet): 3 Feet Assistive device: Rolling walker (2 wheeled) Gait Pattern/deviations: Step-to pattern;Decreased stride length;Wide base of support;Trunk flexed Gait velocity: decreased   General Gait Details: Stand pivot taking, few steps to chair with min guard for safety.  Stairs            Wheelchair Mobility    Modified Rankin (Stroke Patients Only)       Balance Overall balance assessment: Needs assistance   Sitting balance-Leahy Scale: Good       Standing balance-Leahy Scale: Poor Standing balance comment: Requires use of RW to stand and maintain balance                             Pertinent Vitals/Pain Pain Assessment: Faces Faces Pain Scale: Hurts a little bit Pain Descriptors / Indicators: Dull;Grimacing Pain Intervention(s): Limited activity within patient's tolerance;Monitored during session;Premedicated before session (pt reports RN gave medication 30 min pre session)    Home Living Family/patient expects to be discharged to:: Private residence Living Arrangements: Spouse/significant other;Children Available Help at Discharge: Family Type of Home: House Home Access: Level entry     Home Layout: One level Home Equipment: Cane - single point;Walker - 4 wheels Additional Comments: Reports she would like BSC, shower chair, two wheeled RW    Prior Function Level of Independence: Independent with  assistive device(s)         Comments: Uses cane for community negotiation, cares for grandchildren     Hand Dominance   Dominant Hand: Right    Extremity/Trunk Assessment   Upper Extremity Assessment Upper Extremity Assessment: Overall WFL  for tasks assessed    Lower Extremity Assessment Lower Extremity Assessment: RLE deficits/detail;LLE deficits/detail RLE Deficits / Details: WFL, able to complete LE therex and transfer to chair min guard. LLE Deficits / Details: Able to complete ankle pumps and quad set on LE LLE: Unable to fully assess due to pain       Communication   Communication: No difficulties  Cognition Arousal/Alertness: Awake/alert Behavior During Therapy: WFL for tasks assessed/performed Overall Cognitive Status: Within Functional Limits for tasks assessed                                        General Comments General comments (skin integrity, edema, etc.): Observed no abnormal skin integumentary changes at operative site nor underneath knee brace, pt reported she needed to use restroom after session, nurse entered room and reported he would alert nurse tech.    Exercises Total Joint Exercises Ankle Circles/Pumps: AROM;10 reps;Both;Seated Quad Sets: AROM;10 reps;Both;Seated   Assessment/Plan    PT Assessment Patient needs continued PT services  PT Problem List Decreased strength;Decreased mobility;Decreased range of motion;Decreased knowledge of precautions;Decreased balance;Decreased knowledge of use of DME;Pain       PT Treatment Interventions DME instruction;Therapeutic activities;Gait training;Patient/family education;Therapeutic exercise;Stair training;Balance training;Functional mobility training;Neuromuscular re-education    PT Goals (Current goals can be found in the Care Plan section)  Acute Rehab PT Goals Patient Stated Goal: Home, walk better PT Goal Formulation: With patient Time For Goal Achievement: 01/07/20 Potential to Achieve Goals: Good    Frequency Min 5X/week   Barriers to discharge   no barriers    Co-evaluation               AM-PAC PT "6 Clicks" Mobility  Outcome Measure Help needed turning from your back to your side while in a flat bed  without using bedrails?: None Help needed moving from lying on your back to sitting on the side of a flat bed without using bedrails?: None Help needed moving to and from a bed to a chair (including a wheelchair)?: None Help needed standing up from a chair using your arms (e.g., wheelchair or bedside chair)?: None Help needed to walk in hospital room?: A Little Help needed climbing 3-5 steps with a railing? : A Little 6 Click Score: 22    End of Session Equipment Utilized During Treatment: Gait belt Activity Tolerance: Patient tolerated treatment well Patient left: in chair;with call bell/phone within reach;with chair alarm set Nurse Communication: Mobility status (need to use bathroom) PT Visit Diagnosis: Unsteadiness on feet (R26.81);Other abnormalities of gait and mobility (R26.89);Muscle weakness (generalized) (M62.81);Pain Pain - Right/Left: Left Pain - part of body: Hip    Time: 1003-1030 PT Time Calculation (min) (ACUTE ONLY): 27 min   Charges:   PT Evaluation $PT Eval Moderate Complexity: 1 Mod PT Treatments $Therapeutic Activity: 8-22 mins        [employer] SPT 12/24/2019   Rolland Porter 12/24/2019, 12:16 PM

## 2019-12-25 ENCOUNTER — Inpatient Hospital Stay: Payer: Self-pay

## 2019-12-25 DIAGNOSIS — T8452XA Infection and inflammatory reaction due to internal left hip prosthesis, initial encounter: Principal | ICD-10-CM

## 2019-12-25 LAB — VANCOMYCIN, TROUGH: Vancomycin Tr: 17 ug/mL (ref 15–20)

## 2019-12-25 LAB — BASIC METABOLIC PANEL WITH GFR
Anion gap: 10 (ref 5–15)
BUN: 18 mg/dL (ref 8–23)
CO2: 25 mmol/L (ref 22–32)
Calcium: 8.6 mg/dL — ABNORMAL LOW (ref 8.9–10.3)
Chloride: 104 mmol/L (ref 98–111)
Creatinine, Ser: 1.15 mg/dL — ABNORMAL HIGH (ref 0.44–1.00)
GFR calc Af Amer: 59 mL/min — ABNORMAL LOW
GFR calc non Af Amer: 51 mL/min — ABNORMAL LOW
Glucose, Bld: 168 mg/dL — ABNORMAL HIGH (ref 70–99)
Potassium: 3.4 mmol/L — ABNORMAL LOW (ref 3.5–5.1)
Sodium: 139 mmol/L (ref 135–145)

## 2019-12-25 LAB — GLUCOSE, CAPILLARY
Glucose-Capillary: 124 mg/dL — ABNORMAL HIGH (ref 70–99)
Glucose-Capillary: 102 mg/dL — ABNORMAL HIGH (ref 70–99)
Glucose-Capillary: 152 mg/dL — ABNORMAL HIGH (ref 70–99)
Glucose-Capillary: 137 mg/dL — ABNORMAL HIGH (ref 70–99)

## 2019-12-25 LAB — HEMOGLOBIN A1C
Hgb A1c MFr Bld: 6 % — ABNORMAL HIGH (ref 4.8–5.6)
Mean Plasma Glucose: 126 mg/dL

## 2019-12-25 MED ORDER — SODIUM CHLORIDE 0.9% FLUSH
10.0000 mL | Freq: Two times a day (BID) | INTRAVENOUS | Status: DC
  Administered 2019-12-25 – 2019-12-26 (×2): 10 mL

## 2019-12-25 MED ORDER — CHLORHEXIDINE GLUCONATE CLOTH 2 % EX PADS
6.0000 | MEDICATED_PAD | Freq: Every day | CUTANEOUS | Status: DC
  Administered 2019-12-26: 6 via TOPICAL

## 2019-12-25 MED ORDER — RIFAMPIN 300 MG PO CAPS
600.0000 mg | ORAL_CAPSULE | Freq: Every day | ORAL | Status: DC
  Administered 2019-12-25 – 2019-12-26 (×2): 600 mg via ORAL
  Filled 2019-12-25 (×3): qty 2

## 2019-12-25 MED ORDER — SODIUM CHLORIDE 0.9% FLUSH: 10.0000 mL | INTRAVENOUS | Status: DC | PRN

## 2019-12-25 NOTE — Progress Notes (Signed)
PHARMACY CONSULT NOTE FOR:  OUTPATIENT  PARENTERAL ANTIBIOTIC THERAPY (OPAT)  Indication: Chronic left prosthetic hip infection        Regimen: Vancomycin 750 mg IV Q12H End date: 02/04/2020  IV antibiotic discharge orders are pended. To discharging provider:  please sign these orders via discharge navigator,  Select New Orders & click on the button choice - Manage This Unsigned Work.     Thank you for allowing pharmacy to be a part of this patient's care.  Lorel Monaco, PharmD PGY1 Ambulatory Care Resident

## 2019-12-25 NOTE — Consult Note (Signed)
[facility] for Infectious Disease    Date of Admission:  12/23/2019           Day 2 vancomycin        Day 2 piperacillin tazobactam       Reason for Consult: Chronic left prosthetic hip infection    Referring Provider: Dr. Eduard Roux  Assessment: I strongly suspect that she has chronic, smoldering MRSA prosthetic hip infection.  I talked to her about treatment options and she is agreeable with PICC placement followed by 6 weeks of IV vancomycin.  I will also add oral rifampin.  Plan: 1. PICC placement 2. IV vancomycin 3. Discontinue piperacillin tazobactam  Diagnosis: Left prosthetic hip infection  Culture Result: Culture negative so far but MRSA previously  Allergies  Allergen Reactions  . Fluconazole Swelling and Other (See Comments)    Face swelling  . Robitussin (Alcohol Free) [Guaifenesin] Palpitations and Other (See Comments)    Chest pain    OPAT Orders Discharge antibiotics to be given via PICC line Discharge antibiotics: Per pharmacy protocol vancomycin Aim for Vancomycin trough 15-20 or AUC 400-550 (unless otherwise indicated) Duration: 6 weeks End Date: 02/04/2020  [facility] Care Per Protocol:  Home health RN for IV administration and teaching; PICC line care and labs.    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 amitriptyline  150 mg Oral QHS  . aspirin  81 mg Oral BID  . docusate sodium  100 mg Oral BID  . ferrous sulfate  325 mg Oral BID  . gabapentin  300 mg  Oral TID  . hydrochlorothiazide  25 mg Oral Daily  . insulin aspart  0-20 Units Subcutaneous TID WC  . insulin aspart  0-5 Units Subcutaneous QHS  . metoprolol tartrate  25 mg Oral BID  . oxyCODONE  10 mg Oral Q12H  . simvastatin  20 mg Oral QHS  . topiramate  50 mg Oral Daily  . zinc sulfate  220 mg Oral Daily   Continuous Infusions: . sodium chloride 75 mL/hr at 12/24/19 1258  . piperacillin-tazobactam (ZOSYN)  IV 3.375 g (12/25/19 0620)  . vancomycin 750 mg (12/25/19 0617)   PRN Meds:.acetaminophen, alum & mag hydroxide-simeth, diphenhydrAMINE, HYDROmorphone (DILAUDID) injection, magnesium citrate, menthol-cetylpyridinium **OR** phenol, metoCLOPramide **OR** metoCLOPramide (REGLAN) injection, ondansetron **OR** ondansetron (ZOFRAN) IV, oxyCODONE, oxyCODONE, polyethylene glycol, sorbitol  HPI: Kristin [name] is a 64 y.o. female who underwent left total hip arthroplasty on 06/01/2016.  She developed acute infection with MRSA in January 2018.  She underwent incision and drainage with polyexchange and was discharged on IV vancomycin before converting to oral doxycycline.  She completed therapy in June 2018.  She has had intermittent boils form along her left hip incision since that time.  She has been treated with oral trimethoprim sulfamethoxazole.  She underwent scar revision on 01/19/2018.  No organisms were seen on Gram stain and cultures were negative.  Dr. Lyndee Leo Dillingham excised an 8  cm deep fistula on 07/17/2019.  There was no evidence of infection noted in the operative report and no cultures were obtained.  A CT scan this past February showed some sclerosis and fluid around the prosthesis.  An x-ray on 11/12/2019 showed lucency around the femoral stem.  She stopped taking trimethoprim sulfamethoxazole on 12/13/2019 and was admitted 2 days ago with a plan for probable excision arthroplasty.  At the time of surgery the prosthesis was noted to be firmly in place with sclerotic bone.  She  underwent debridement and polyexchange again.  No organisms were seen on stain and cultures are negative at 48 hours.   Review of Systems: Review of Systems  Constitutional: Negative for chills, diaphoresis and fever.  Musculoskeletal: Positive for joint pain.    Past Medical History:  Diagnosis Date  . Allergy   . Anemia   . Anxiety   . Arthritis   . Back pain, chronic   . Borderline diabetes   . Bronchitis   . Constipation   . Cough   . Depression   . Epilepsy ([place])   . GERD (gastroesophageal reflux disease)   . Hyperlipidemia   . Hypertension   . IBS (irritable bowel syndrome)   . Joint pain   . Obesity   . Palpitations   . Pneumonia 2016  . Pre-diabetes   . 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 Rheumatoid arthritis ([place])   . Seizures ([place])    last >10 yrs ago- recorded 01/18/18  . Somatization disorder   . Tobacco abuse     Social History   Tobacco Use  . Smoking status: Former Smoker    Packs/day: 0.25    Years: 0.50    Pack years: 0.12    Types: Cigarettes    Quit date: 01/27/2014    Years since quitting: 5.9  . Smokeless tobacco: Never Used  Vaping Use  . Vaping Use: Never used  Substance Use Topics  . Alcohol use: No  . Drug use: No    Family History  Problem Relation Age of Onset  . Other Mother        failed surgery  . Heart Problems Father        poss MI, not sure:per pt  . Alzheimer's disease Maternal Grandmother   . Heart disease Maternal Grandfather   . Colon cancer Maternal Uncle        not sure age of onset   Allergies  Allergen Reactions  . Fluconazole Swelling and Other (See Comments)    Face swelling  . Robitussin (Alcohol Free) [Guaifenesin] Palpitations and Other (See Comments)    Chest pain    OBJECTIVE: Blood pressure 129/76, pulse 90, temperature 97.6 F (36.4 C), temperature source Oral, resp. rate 17, height '5\' 2"'  (1.575 m), weight 110.4 kg, SpO2 100 %.  Physical Exam Constitutional:       Comments: She is resting quietly in bed.  She is in no distress.  Musculoskeletal:     Comments: Left hip incision dressed.  Psychiatric:        Mood and Affect: Mood normal.     Lab Results Lab Results  Component Value Date   WBC 7.1 12/24/2019   HGB 8.5 (L) 12/24/2019   HCT 26.9 (L) 12/24/2019   MCV 92.1 12/24/2019   PLT 293 12/24/2019    Lab Results  Component Value Date   CREATININE 0.91 12/24/2019   BUN 12 12/24/2019   NA 137 12/24/2019  K 3.9 12/24/2019   CL 106 12/24/2019   CO2 25 12/24/2019    Lab Results  Component Value Date   ALT 17 12/18/2019   AST 14 (L) 12/18/2019   ALKPHOS 95 12/18/2019   BILITOT 0.4 12/18/2019     Microbiology: Recent Results (from the past 240 hour(s))  Surgical pcr screen     Status: None   Collection Time: 12/18/19  1:17 PM   Specimen: Nasal Mucosa; Nasal Swab  Result Value Ref Range Status   MRSA, PCR NEGATIVE NEGATIVE Final   Staphylococcus aureus NEGATIVE NEGATIVE Final    Comment: (NOTE) The Xpert SA Assay (FDA approved for NASAL specimens in patients 33 years of age and older), is one component of a comprehensive surveillance program. It is not intended to diagnose infection nor to guide or monitor treatment. Performed at [facility], [place] [address] [place], [place] [address]   SARS CORONAVIRUS 2 (TAT 6-24 HRS) Nasopharyngeal Nasopharyngeal Swab     Status: None   Collection Time: 12/20/19 10:13 AM   Specimen: Nasopharyngeal Swab  Result Value Ref Range Status   SARS Coronavirus 2 NEGATIVE NEGATIVE Final    Comment: (NOTE) SARS-CoV-2 target nucleic acids are NOT DETECTED.  The SARS-CoV-2 RNA is generally detectable in upper and lower respiratory specimens during the acute phase of infection. Negative results do not preclude SARS-CoV-2 infection, do not rule out co-infections with other pathogens, and should not be used as the sole basis for treatment or other patient management decisions. Negative results  must be combined with clinical observations, patient history, and epidemiological information. The expected result is Negative.  Fact Sheet for Patients: SugarRoll.be  Fact Sheet for Healthcare Providers: https://www.woods-mathews.com/  This test is not yet approved or cleared by the [place] FDA and  has been authorized for detection and/or diagnosis of SARS-CoV-2 by FDA under an Emergency Use Authorization (EUA). This EUA will remain  in effect (meaning this test can be used) for the duration of the COVID-19 declaration under Se ction 564(b)(1) of the Act, 21 U.S.C. section 360bbb-3(b)(1), unless the authorization is terminated or revoked sooner.  Performed at [facility], [place] [address], [place], [place] [address]   Aerobic/Anaerobic Culture (surgical/deep wound)     Status: None (Preliminary result)   Collection Time: 12/23/19  2:00 PM   Specimen: Wound; Abscess  Result Value Ref Range Status   Specimen Description WOUND  Final   Special Requests LEFT HIP SYNOVIAL FLUID SPEC A  Final   Gram Stain   Final    RARE WBC PRESENT, PREDOMINANTLY PMN NO ORGANISMS SEEN Gram Stain Report Called to,Read Back By and Verified With: RN K STARICK AT [id number] 12/23/19 BY L BENFIELD    Culture   Final    NO GROWTH 2 DAYS Performed at [facility], [place] [address] [place], [place] [address]    Report Status PENDING  Incomplete  Aerobic/Anaerobic Culture (surgical/deep wound)     Status: None (Preliminary result)   Collection Time: 12/23/19  2:05 PM   Specimen: Abscess  Result Value Ref Range Status   Specimen Description TISSUE  Final   Special Requests LEFT HIP SYNOVIUM FROZEN [place] B  Final   Gram Stain   Final    FEW WBC PRESENT, PREDOMINANTLY MONONUCLEAR NO ORGANISMS SEEN Gram Stain Report Called to,Read Back By and Verified With: RN K STARICK AT 6160 12/23/19 BY L BENFIELD    Culture   Final  NO GROWTH 2 DAYS Performed at  [facility], [place] [address] [place], [place] [address]    Report Status PENDING  Incomplete  Aerobic/Anaerobic Culture (surgical/deep wound)     Status: None (Preliminary result)   Collection Time: 12/23/19  2:18 PM   Specimen: Abscess  Result Value Ref Range Status   Specimen Description TISSUE  Final   Special Requests LEFT FEMORAL COMPONENT SPEC C  Final   Gram Stain NO WBC SEEN NO ORGANISMS SEEN   Final   Culture   Final    NO GROWTH 2 DAYS Performed at [facility], [place] [address] [place], [place] [address]    Report Status PENDING  Incomplete  Aerobic/Anaerobic Culture (surgical/deep wound)     Status: None (Preliminary result)   Collection Time: 12/23/19  3:15 PM   Specimen: Soft Tissue, Other  Result Value Ref Range Status   Specimen Description TISSUE LEFT HIP  Final   Special Requests FISTULA  Final   Gram Stain   Final    RARE WBC PRESENT, PREDOMINANTLY MONONUCLEAR NO ORGANISMS SEEN    Culture   Final    NO GROWTH 2 DAYS Performed at [facility], [place] [address], [place], [place] [address]    Report Status PENDING  Incomplete  Aerobic/Anaerobic Culture (surgical/deep wound)     Status: None (Preliminary result)   Collection Time: 12/23/19  3:32 PM   Specimen: Soft Tissue, Other  Result Value Ref Range Status   Specimen Description TISSUE  Final   Special Requests FROM BEHIND LEFT ACETABULUM  Final   Gram Stain   Final    RARE WBC PRESENT,BOTH PMN AND MONONUCLEAR NO ORGANISMS SEEN    Culture   Final    NO GROWTH 2 DAYS Performed at [facility], [place] [address] [place], [place] [address]    Report Status PENDING  Incomplete    Michel Bickers, MD [facility] for [facility] [place] [facility] [phone] pager   [phone] cell 12/25/2019, 11:08 AM

## 2019-12-25 NOTE — Progress Notes (Signed)
Peripherally Inserted Central Catheter Placement  The IV Nurse has discussed with the patient and/or persons authorized to consent for the patient, the purpose of this procedure and the potential benefits and risks involved with this procedure.  The benefits include less needle sticks, lab draws from the catheter, and the patient may be discharged home with the catheter. Risks include, but not limited to, infection, bleeding, blood clot (thrombus formation), and puncture of an artery; nerve damage and irregular heartbeat and possibility to perform a PICC exchange if needed/ordered by physician.  Alternatives to this procedure were also discussed.  Bard Power PICC patient education guide, fact sheet on infection prevention and patient information card has been provided to patient /or left at bedside.    PICC Placement Documentation  PICC Single Lumen 96/72/89 PICC Right Basilic 36 cm 0 cm (Active)  Indication for Insertion or Continuance of Line Home intravenous therapies (PICC only) 12/25/19 1000  Exposed Catheter (cm) 0 cm 12/25/19 1000  Site Assessment Clean;Dry;Intact 12/25/19 1000  Line Status Flushed;Saline locked;Blood return noted 12/25/19 1000  Dressing Type Transparent;Securing device 12/25/19 1000  Dressing Status Clean;Dry;Intact;Antimicrobial disc in place 12/25/19 1000  Dressing Change Due 01/01/20 12/25/19 1000       Holley Bouche [place] 12/25/2019, 5:56 PM

## 2019-12-25 NOTE — Progress Notes (Signed)
Pharmacy Antibiotic Note  Kristin [name] is a 64 y.o. female admitted on 12/23/2019 with chronic infection of L prosthetic hip.  Pharmacy had been consulted for vancomycin and Zosyn dosing (Zosyn was discontinued by ID today).  ID has been consulted, recommend 6 wks of IV vancomycin for suspected chronic, smoldering MRSA infection of chronic L prosthetic hip; also added rifamipin today. PICC placed today for outpatient IV antibiotic therapy; OPAT orders have been placed.  WBC 7.1, afebrile; Scr 1.15 this evening (up from 0.91 yesterday, but BL appears to be ~1.11, based on values in Epic)  Steady-state vancomycin trough level prior to fifth dose of vancomycin (1 gram, followed by 750 mg IV Q 12 hrs) today is 17 mg/L, which is within the goal vancomycin trough range of 15-20 mg/L.   Plan: Continue vancomycin 750 mg IV Q 12 hrs Monitor WBC, temp, clinical improvement, cultures, renal function, vancomycin levels as indicated  Height: 5\' 2"  (157.5 cm) Weight: 110.4 kg (243 lb 6.2 oz) IBW/kg (Calculated) : 50.1  Temp (24hrs), Avg:98.1 F (36.7 C), Min:97.6 F (36.4 C), Max:98.6 F (37 C)  Recent Labs  Lab 12/24/19 0329 12/25/19 1816  WBC 7.1  --   CREATININE 0.91 1.15*  VANCOTROUGH  --  17    Estimated Creatinine Clearance: 59.4 mL/min (A) (by C-G formula based on SCr of 1.15 mg/dL (H)).    Allergies  Allergen Reactions  . Fluconazole Swelling and Other (See Comments)    Face swelling  . Robitussin (Alcohol Free) [Guaifenesin] Palpitations and Other (See Comments)    Chest pain    Antimicrobials this admission:  6/21 vancomycin >> 6/21 - 6/23 Zosyn 6/232 rifampin PO  Microbiology results:  6/18 COVID: negative 6/16 MRSA PCR: negative 6/21 L hip synovial fluid specimen A cx: NGTD 6/21 L hip synovial fluid specimen B cx: NGTD 6/21 L femoral component specimen C cx: pending 6/21 L hip tissue (fistula) cx: NGTD 6/21 Tissue from behind L acetabulum cx: NGTD  Thank you  for allowing pharmacy to be a part of this patient's care.  Gillermina Hu, PharmD, BCPS, [facility] Clinical Pharmacist  12/25/2019 7:06 PM

## 2019-12-25 NOTE — Progress Notes (Signed)
   Subjective:  Patient reports pain as mild.    Objective:   VITALS:   Vitals:   12/24/19 1700 12/24/19 2000 12/24/19 2100 12/25/19 0336  BP: 103/68 132/60  129/76  Pulse: 85 94  90  Resp: 18 17  17   Temp: 98.2 F (36.8 C) 98 F (36.7 C)  97.6 F (36.4 C)  TempSrc: Oral Oral  Oral  SpO2: 98% 98% 98% 100%  Weight:      Height:        Neurovascular intact Sensation intact distally Intact pulses distally Dorsiflexion/Plantar flexion intact Incision: dressing C/D/I and no drainage  iVAC intact   Lab Results  Component Value Date   WBC 7.1 12/24/2019   HGB 8.5 (L) 12/24/2019   HCT 26.9 (L) 12/24/2019   MCV 92.1 12/24/2019   PLT 293 12/24/2019     Assessment/Plan:  2 Days Post-Op   - Expected postop acute blood loss anemia - will monitor for symptoms - Up with PT/OT - DVT ppx - SCDs, ambulation, aspirin - WBAT operative extremity - posterior hip precautions - Pain control - ID called for recommendations - discussed two stage revision vs chronic suppression - cultures NGTD   Eduard Roux 12/25/2019, 11:29 AM [phone]

## 2019-12-25 NOTE — Telephone Encounter (Signed)
Kristin [name] aware 

## 2019-12-25 NOTE — Consult Note (Signed)
[place] Nurse Consult Note: Patient receiving care in [facility]. Reason for Consult: VAC machine constantly detecting a leak Wound type: left hip surgical incision with incision NPWT Pressure Injury POA: Yes/No/NA Measurement: na Wound bed: Drainage (amount, consistency, odor)  Periwound: Dressing procedure/placement/frequency: I tried replacing the VAC cannister, the TRAC pad, then the VAC machine, all without success. Supplies ordered. Entire dressing taken down. Surgical incision site protected with Mepitel. Foam strips cut to cover incisions, bridged together.  Immediate seal obtained. VAC machine set on 100 mm Hg since there is such an extensive area to provide suction to.  Patient tolerated without difficulty. [place] nurses do not have access to [employer]  When the surgeon is ready to discharge the patient, if a Prevena VAC is needed, someone on surgical services will need to obtain it from the OR area. Thank you for the consult.  Discussed plan of care with the patient and bedside nurse.  [place] nurse will not follow at this time.  Please re-consult the [place] team if needed.  Val Riles, RN, MSN, CWOCN, CNS-BC, pager [phone]

## 2019-12-25 NOTE — Progress Notes (Signed)
PT Cancellation Note  Patient Details Name: Kristin [name] MRN: [id number] DOB: July 27, 1955   Cancelled Treatment:    Reason Eval/Treat Not Completed: Patient at procedure or test/unavailable. Will reattempt as schedule allows.     Rolland Porter 12/25/2019, 4:31 PM

## 2019-12-25 NOTE — Plan of Care (Signed)

## 2019-12-26 DIAGNOSIS — T8452XS Infection and inflammatory reaction due to internal left hip prosthesis, sequela: Secondary | ICD-10-CM | POA: Diagnosis not present

## 2019-12-26 LAB — CREATININE, SERUM
Creatinine, Ser: 1.1 mg/dL — ABNORMAL HIGH (ref 0.44–1.00)
GFR calc Af Amer: >60 mL/min (ref >60)
GFR calc non Af Amer: 54 mL/min — ABNORMAL LOW (ref >60)

## 2019-12-26 LAB — GLUCOSE, CAPILLARY
Glucose-Capillary: 109 mg/dL — ABNORMAL HIGH (ref 70–99)
Glucose-Capillary: 116 mg/dL — ABNORMAL HIGH (ref 70–99)

## 2019-12-26 MED ORDER — RIFAMPIN 300 MG PO CAPS: 600.0000 mg | ORAL_CAPSULE | Freq: Every day | ORAL | 0 refills | Status: DC

## 2019-12-26 MED ORDER — VANCOMYCIN IV (FOR PTA / DISCHARGE USE ONLY): 750.0000 mg | Freq: Two times a day (BID) | INTRAVENOUS | 0 refills | Status: DC

## 2019-12-26 NOTE — Discharge Summary (Signed)
Patient ID: Kristin [name] MRN: [id number] DOB/AGE: 63-Jun-1957 68 y.o.  Admit date: 12/23/2019 Discharge date: 12/26/2019  Admission Diagnoses:  Prosthetic joint infection of left hip [facility]  Discharge Diagnoses:  Principal Problem:   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HCC) Active Problems:   Dyslipidemia   Morbid obesity (HCC)   HYPERTENSION, BENIGN SYSTEMIC   Diabetes (HCC)   Past Medical History:  Diagnosis Date  . Allergy   . Anemia   . Anxiety   . Arthritis   . Back pain, chronic   . Borderline diabetes   . Bronchitis   . Constipation   . Cough   . Depression   . Epilepsy (HCC)   . GERD (gastroesophageal reflux disease)   . Hyperlipidemia   . Hypertension   . IBS (irritable bowel syndrome)   . Joint pain   . Obesity   . Palpitations   . Pneumonia 2016  . Pre-diabetes   . 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 Rheumatoid arthritis (HCC)   . Seizures (HCC)    last >10 yrs ago- recorded 01/18/18  . Somatization disorder   . Tobacco abuse     Surgeries: Procedure(s): LEFT HIP IRRIGATION AND DEBRIDEMENT LEFT HIP WOUND and poly exchange  APPLICATION OF WOUND VAC on 12/23/2019   Consultants (if any):   Discharged Condition: Improved  Hospital Course: Kristin [name] is an 64 y.o. female who was admitted 12/23/2019 with a diagnosis of 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HCC) and went to the operating room on 12/23/2019 and underwent the above named procedures.    She was given perioperative antibiotics:  Anti-infectives (From admission, onward)   Start     Dose/Rate Route Frequency Ordered Stop   12/26/19 0000  vancomycin IVPB     Discontinue     750 mg Intravenous Every 12 hours 12/26/19 0657 02/05/20 2359   12/26/19 0000  rifampin (RIFADIN) 300 MG capsule     Discontinue     600 mg Oral Daily 12/26/19 0855 02/04/20 2359   12/25/19 1300  rifampin (RIFADIN) capsule 600 mg     Discontinue     600 mg Oral Daily 12/25/19 1143       12/24/19 0600  vancomycin (VANCOREADY) IVPB 750 mg/150 mL     Discontinue     750 mg 150 mL/hr over 60 Minutes Intravenous Every 12 hours 12/23/19 1857     12/23/19 2200  piperacillin-tazobactam (ZOSYN) IVPB 3.375 g  Status:  Discontinued        3.375 g 12.5 mL/hr over 240 Minutes Intravenous Every 8 hours 12/23/19 1857 12/25/19 1143   12/23/19 1900  ceFAZolin (ANCEF) IVPB 2g/100 mL premix  Status:  Discontinued        2 g 200 mL/hr over 30 Minutes Intravenous Every 6 hours 12/23/19 1848 12/23/19 1901   12/23/19 1900  vancomycin (VANCOCIN) IVPB 1000 mg/200 mL premix        1,000 mg 200 mL/hr over 60 Minutes Intravenous  Once 12/23/19 1857 12/23/19 2209   12/23/19 1546  tobramycin (NEBCIN) powder  Status:  Discontinued          As needed 12/23/19 1546 12/23/19 1637   12/23/19 1430  piperacillin-tazobactam (ZOSYN) IVPB 3.375 g        3.375 g 100 mL/hr over 30 Minutes Intravenous  Once 12/23/19 1415 12/23/19 1448   12/23/19 1400  vancomycin (VANCOCIN) powder  Status:  Discontinued  As needed 12/23/19 1400 12/23/19 1637    .  She was given sequential compression devices, early ambulation, and appropriate chemoprophylaxis for DVT prophylaxis.  She benefited maximally from the hospital stay and there were no complications.    Recent vital signs:  Vitals:   12/26/19 0635 12/26/19 0754  BP: 117/65 115/62  Pulse:  89  Resp: 16 16  Temp: 98.9 F (37.2 C) 98.1 F (36.7 C)  SpO2: 98% 99%    Recent laboratory studies:  Lab Results  Component Value Date   HGB 8.5 (L) 12/24/2019   HGB 10.2 (L) 12/23/2019   HGB 10.4 (L) 12/18/2019   Lab Results  Component Value Date   WBC 7.1 12/24/2019   PLT 293 12/24/2019   Lab Results  Component Value Date   INR 1.0 12/18/2019   Lab Results  Component Value Date   NA 139 12/25/2019   K 3.4 (L) 12/25/2019   CL 104 12/25/2019   CO2 25 12/25/2019   BUN 18 12/25/2019   CREATININE 1.10 (H) 12/26/2019   GLUCOSE 168 (H) 12/25/2019     Discharge Medications:   Allergies as of 12/26/2019      Reactions   Fluconazole Swelling, Other (See Comments)   Face swelling   Robitussin (alcohol Free) [guaifenesin] Palpitations, Other (See Comments)   Chest pain      Medication List    STOP taking these medications   sulfamethoxazole-trimethoprim 800-160 MG tablet Commonly known as: BACTRIM DS     TAKE these medications   acetaminophen 650 MG CR tablet Commonly known as: TYLENOL Take 1,300 mg by mouth every 8 (eight) hours as needed for pain.   amitriptyline 75 MG tablet Commonly known as: ELAVIL Take 2 tablets (150 mg total) by mouth at bedtime.   cholecalciferol 25 MCG (1000 UNIT) tablet Commonly known as: VITAMIN D3 Take 1,000 Units by mouth daily.   ferrous sulfate 325 (65 FE) MG tablet Take 1 tablet (325 mg total) by mouth 2 (two) times daily. What changed: when to take this   hydrochlorothiazide 25 MG tablet Commonly known as: HYDRODIURIL Take 1 tablet (25 mg total) by mouth daily.   ibuprofen 200 MG tablet Commonly known as: ADVIL Take 400 mg by mouth every 6 (six) hours as needed for moderate pain.   levocetirizine 5 MG tablet Commonly known as: Xyzal Take 1 tablet (5 mg total) by mouth every evening. What changed:   when to take this  reasons to take this   metFORMIN 500 MG tablet Commonly known as: GLUCOPHAGE TAKE ONE TABLET BY MOUTH ONCE DAILY WITH BREAKFAST, AFTER 2 WEEKS, TAKE AN EXTRA TABLET EACH EVENING TO GET TO 2 TABLETS DAILY. What changed:   how much to take  how to take this  when to take this  additional instructions   metoprolol tartrate 25 MG tablet Commonly known as: LOPRESSOR Take 1 tablet (25 mg total) by mouth 2 (two) times daily.   omeprazole 20 MG capsule Commonly known as: PRILOSEC Take 20 mg by mouth at bedtime.   rifampin 300 MG capsule Commonly known as: RIFADIN Take 2 capsules (600 mg total) by mouth daily.   simvastatin 20 MG tablet Commonly  known as: ZOCOR Take 1 tablet (20 mg total) by mouth at bedtime.   topiramate 50 MG tablet Commonly known as: Topamax Take 1 tablet (50 mg total) by mouth daily.   vancomycin  IVPB Inject 750 mg into the vein every 12 (twelve) hours. Indication: Chronic left prosthetic  hip infection        First Dose: Yes Last Day of Therapy: 02/04/2020 Labs - "Sunday/Monday:  CBC/D, BMP, and vancomycin trough. Labs - Thursday:  BMP and vancomycin trough Labs - Every other week:  ESR and CRP Method of administration:Elastomeric Method of administration may be changed at the discretion of the patient and/or caregiver's ability to self-administer the medication ordered.   Vitamin C 250 MG Chew Chew 250 mg by mouth daily.   zinc gluconate 50 MG tablet Take 50 mg by mouth daily.            Durable Medical Equipment  (From admission, onward)         Start     Ordered   12/23/19 1849  DME Walker rolling  Once       Question:  Patient needs a walker to treat with the following condition  Answer:  History of hip replacement   12/23/19 1848   12/23/19 1849  DME 3 n 1  Once        12/23/19 1848   12/23/19 1849  DME Bedside commode  Once       Question:  Patient needs a bedside commode to treat with the following condition  Answer:  History of hip replacement   12/23/19 1848           Discharge Care Instructions  (From admission, onward)         Start     Ordered   12/26/19 0000  Change dressing on IV access line weekly and PRN  (Home infusion instructions - [facility] )        06" /24/21 0657          Diagnostic Studies: DG Chest 2 View  Result Date: 12/19/2019 CLINICAL DATA:  Preoperative exam. EXAM: CHEST - 2 VIEW COMPARISON:  Chest x-ray 05/23/2016. FINDINGS: Mediastinum hilar structures normal. Heart size normal. Low lung volumes. No focal infiltrate. No pleural effusion or pneumothorax. Degenerative change thoracic spine. Surgical clips right upper quadrant. IMPRESSION:  Low lung volumes. No acute cardiopulmonary disease identified. Chest is stable from prior exam. Electronically Signed   By: Maisie Fus  Register   On: 12/19/2019 05:58   DG Pelvis Portable  Result Date: 12/23/2019 CLINICAL DATA:  64 year old female status post left hip radiation. EXAM: PORTABLE PELVIS 1-2 VIEWS COMPARISON:  Left hip radiograph dated 07/31/2017 FINDINGS: Head there is a total left hip arthroplasty. The arthroplasty components appear intact and in anatomic alignment. No acute fracture or dislocation. Moderate arthritic changes of the right hip. Small pockets of air in the soft tissues of the left hip, likely related to recent procedure. IMPRESSION: 1. No acute fracture or dislocation. 2. Left hip arthroplasty appears intact. Electronically Signed   By: Elgie Collard M.D.   On: 12/23/2019 18:55   [place] EKG SITE RITE  Result Date: 12/25/2019 If Site Rite image not attached, placement could not be confirmed due to current cardiac rhythm.   Disposition: Discharge disposition: 01-Home or Self Care       Discharge Instructions    [facility] pharmacist to adjust dose for Vancomycin, Aminoglycosides and other anti-infective therapies as requested by physician.   Complete by: As directed    [facility] to provide Cath Flo 2mg    Complete by: As directed    Administer for PICC line occlusion and as ordered by physician for other access device issues.   Anaphylaxis Kit: Provided to treat any anaphylactic reaction to the medication  being provided to the patient if First Dose or when requested by physician   Complete by: As directed    Epinephrine 1mg /ml vial / amp: Administer 0.3mg  (0.28ml) subcutaneously once for moderate to severe anaphylaxis, nurse to call physician and pharmacy when reaction occurs and call 911 if needed for immediate care   Diphenhydramine 50mg /ml IV vial: Administer 25-50mg  IV/IM PRN for first dose reaction, rash, itching, mild reaction, nurse to  call physician and pharmacy when reaction occurs   Sodium Chloride 0.9% NS IV: Administer if needed for hypovolemic blood pressure drop or as ordered by physician after call to physician with anaphylactic reaction   Call MD / Call 911   Complete by: As directed    If you experience chest pain or shortness of breath, CALL 911 and be transported to the hospital emergency room.  If you develope a fever above 101.5 F, pus (white drainage) or increased drainage or redness at the wound, or calf pain, call your surgeon's office.   Change dressing on IV access line weekly and PRN   Complete by: As directed    Constipation Prevention   Complete by: As directed    Drink plenty of fluids.  Prune juice may be helpful.  You may use a stool softener, such as Colace (over the counter) 100 mg twice a day.  Use MiraLax (over the counter) for constipation as needed.   Driving restrictions   Complete by: As directed    No driving while taking narcotic pain meds.   Flush IV access with Sodium Chloride 0.9% and Heparin 10 units/ml or 100 units/ml   Complete by: As directed    Home infusion instructions - [facility]   Complete by: As directed    Instructions: Flush IV access with Sodium Chloride 0.9% and Heparin 10units/ml or 100units/ml   Change dressing on IV access line: Weekly and PRN   Instructions Cath Flo 2mg : Administer for PICC Line occlusion and as ordered by physician for other access device   [facility] pharmacist to adjust dose for: Vancomycin, Aminoglycosides and other anti-infective therapies as requested by physician   Increase activity slowly as tolerated   Complete by: As directed    Method of administration may be changed at the discretion of home infusion pharmacist based upon assessment of the patient and/or caregiver's ability to self-administer the medication ordered   Complete by: As directed        Follow-up Information    Cliffton Asters, MD Follow up on  01/14/2020.   Specialty: Infectious Diseases Contact information: [address] [place] [address] [phone]        [facility] Follow up.   Why: [facility] will be coordinating your IV therapy through [facility] home infusion.  Your RN for home infusion will be through [facility].  [facility] can be reached at [phone].       [facility] Follow up.   Why: [facility] will be providing teaching for antibiotic infusions and care of your PICC line.   Contact information: [phone]       [facility] Follow up.   Specialty: Home Health Services Why: [facility] will be providing physical therapy at home in a couple of weeks once Dr. Roda Shutters clears you for therapy.  They will call you in a couple of weeks to set up therapy visits. Contact information: [address] [place] [address]  [phone]        [facility] Follow up.   Why: [facility] will be providing a 3:1 and rolling walker to you prior to your discharge home today. Contact information: [address] [place] [address] [phone]                Signed: Glee Arvin 12/26/2019, 1:40 PM

## 2019-12-26 NOTE — Progress Notes (Signed)
Patient ID: Kristin [name], female   DOB: September 05, 1955, 64 y.o.   MRN: [id number]         [facility] for Infectious Disease  Date of Admission:  12/23/2019   Total days of antibiotics 4         ASSESSMENT: 1 operative specimen is being held for possible growth of an anaerobe.  I will continue her current antibiotics.  I am comfortable with discharge home later this afternoon.  I will monitor final culture results and make any necessary changes in her antibiotic therapy.  She is scheduled to follow-up with me in clinic next month.  PLAN: 1. Continue vancomycin and rifampin pending final cultures  Principal Problem:   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HCC) Active Problems:   Dyslipidemia   Morbid obesity (HCC)   HYPERTENSION, BENIGN SYSTEMIC   Diabetes ([place])   Scheduled Meds: . amitriptyline  150 mg Oral QHS  . aspirin  81 mg Oral BID  . Chlorhexidine Gluconate Cloth  6 each Topical Daily  . docusate sodium  100 mg Oral BID  . ferrous sulfate  325 mg Oral BID  . gabapentin  300 mg Oral TID  . hydrochlorothiazide  25 mg Oral Daily  . insulin aspart  0-20 Units Subcutaneous TID WC  . insulin aspart  0-5 Units Subcutaneous QHS  . metoprolol tartrate  25 mg Oral BID  . oxyCODONE  10 mg Oral Q12H  . rifampin  600 mg Oral Daily  . simvastatin  20 mg Oral QHS  . sodium chloride flush  10-40 mL Intracatheter Q12H  . topiramate  50 mg Oral Daily  . zinc sulfate  220 mg Oral Daily   Continuous Infusions: . sodium chloride 75 mL/hr at 12/24/19 1258  . vancomycin 750 mg (12/26/19 0605)   PRN Meds:.acetaminophen, alum & mag hydroxide-simeth, diphenhydrAMINE, HYDROmorphone (DILAUDID) injection, magnesium citrate, menthol-cetylpyridinium **OR** phenol, metoCLOPramide **OR** metoCLOPramide (REGLAN) injection, ondansetron **OR** ondansetron (ZOFRAN) IV, oxyCODONE, oxyCODONE, polyethylene glycol, sodium chloride flush, sorbitol   SUBJECTIVE: She is feeling better.  Review  of Systems: Review of Systems  Constitutional: Negative for chills, diaphoresis and fever.  Gastrointestinal: Negative for abdominal pain, diarrhea, nausea and vomiting.  Musculoskeletal: Positive for joint pain.    Allergies  Allergen Reactions  . Fluconazole Swelling and Other (See Comments)    Face swelling  . Robitussin (Alcohol Free) [Guaifenesin] Palpitations and Other (See Comments)    Chest pain    OBJECTIVE: Vitals:   12/25/19 1241 12/25/19 2105 12/26/19 0635 12/26/19 0754  BP: 119/62 113/61 117/65 115/62  Pulse: 97 (!) 110  89  Resp: 17 18 16 16   Temp: 98.6 F (37 C) 99.6 F (37.6 C) 98.9 F (37.2 C) 98.1 F (36.7 C)  TempSrc:  Oral Oral Oral  SpO2: 100% 97% 98% 99%  Weight:      Height:       Body mass index is 44.52 kg/m.  Physical Exam Constitutional:      Comments: She is in good spirits.  Cardiovascular:     Rate and Rhythm: Normal rate and regular rhythm.     Heart sounds: No murmur heard.   Pulmonary:     Effort: Pulmonary effort is normal.     Breath sounds: Normal breath sounds.  Musculoskeletal:     Comments: VAC dressing on left hip wound.  Skin:    Comments: New right arm PICC.  Psychiatric:        Mood and Affect: Mood normal.  Lab Results Lab Results  Component Value Date   WBC 7.1 12/24/2019   HGB 8.5 (L) 12/24/2019   HCT 26.9 (L) 12/24/2019   MCV 92.1 12/24/2019   PLT 293 12/24/2019    Lab Results  Component Value Date   CREATININE 1.10 (H) 12/26/2019   BUN 18 12/25/2019   NA 139 12/25/2019   K 3.4 (L) 12/25/2019   CL 104 12/25/2019   CO2 25 12/25/2019    Lab Results  Component Value Date   ALT 17 12/18/2019   AST 14 (L) 12/18/2019   ALKPHOS 95 12/18/2019   BILITOT 0.4 12/18/2019     Microbiology: Recent Results (from the past 240 hour(s))  Surgical pcr screen     Status: None   Collection Time: 12/18/19  1:17 PM   Specimen: Nasal Mucosa; Nasal Swab  Result Value Ref Range Status   MRSA, PCR NEGATIVE  NEGATIVE Final   Staphylococcus aureus NEGATIVE NEGATIVE Final    Comment: (NOTE) The Xpert SA Assay (FDA approved for NASAL specimens in patients 56 years of age and older), is one component of a comprehensive surveillance program. It is not intended to diagnose infection nor to guide or monitor treatment. Performed at [facility], [place] [address], [place], [place] [address]   SARS CORONAVIRUS 2 (TAT 6-24 HRS) Nasopharyngeal Nasopharyngeal Swab     Status: None   Collection Time: 12/20/19 10:13 AM   Specimen: Nasopharyngeal Swab  Result Value Ref Range Status   SARS Coronavirus 2 NEGATIVE NEGATIVE Final    Comment: (NOTE) SARS-CoV-2 target nucleic acids are NOT DETECTED.  The SARS-CoV-2 RNA is generally detectable in upper and lower respiratory specimens during the acute phase of infection. Negative results do not preclude SARS-CoV-2 infection, do not rule out co-infections with other pathogens, and should not be used as the sole basis for treatment or other patient management decisions. Negative results must be combined with clinical observations, patient history, and epidemiological information. The expected result is Negative.  Fact Sheet for Patients: SugarRoll.be  Fact Sheet for Healthcare Providers: https://www.woods-mathews.com/  This test is not yet approved or cleared by the [place] FDA and  has been authorized for detection and/or diagnosis of SARS-CoV-2 by FDA under an Emergency Use Authorization (EUA). This EUA will remain  in effect (meaning this test can be used) for the duration of the COVID-19 declaration under Se ction 564(b)(1) of the Act, 21 U.S.C. section 360bbb-3(b)(1), unless the authorization is terminated or revoked sooner.  Performed at [facility], [place] [address], [place], [place] [address]   Aerobic/Anaerobic Culture (surgical/deep wound)     Status: None (Preliminary result)    Collection Time: 12/23/19  2:00 PM   Specimen: Wound; Abscess  Result Value Ref Range Status   Specimen Description WOUND  Final   Special Requests LEFT HIP SYNOVIAL FLUID SPEC A  Final   Gram Stain   Final    RARE WBC PRESENT, PREDOMINANTLY PMN NO ORGANISMS SEEN Gram Stain Report Called to,Read Back By and Verified With: RN K STARICK AT 2683 12/23/19 BY L BENFIELD    Culture   Final    NO GROWTH 3 DAYS Performed at [facility], [place] [address] [place], [place] [address]    Report Status PENDING  Incomplete  Aerobic/Anaerobic Culture (surgical/deep wound)     Status: None (Preliminary result)   Collection Time: 12/23/19  2:05 PM   Specimen: Abscess  Result Value Ref Range Status  Specimen Description TISSUE  Final   Special Requests LEFT HIP SYNOVIUM FROZEN SPEC B  Final   Gram Stain   Final    FEW WBC PRESENT, PREDOMINANTLY MONONUCLEAR NO ORGANISMS SEEN Gram Stain Report Called to,Read Back By and Verified With: RN K STARICK AT 7014 12/23/19 BY L BENFIELD    Culture   Final    NO GROWTH 3 DAYS Performed at [facility], [place] [address], [place], [place] [address]    Report Status PENDING  Incomplete  Aerobic/Anaerobic Culture (surgical/deep wound)     Status: None (Preliminary result)   Collection Time: 12/23/19  2:18 PM   Specimen: Abscess  Result Value Ref Range Status   Specimen Description TISSUE  Final   Special Requests LEFT FEMORAL COMPONENT SPEC C  Final   Gram Stain NO WBC SEEN NO ORGANISMS SEEN   Final   Culture   Final    HOLDING FOR POSSIBLE ANAEROBE Performed at [facility], [address] [place], [place] [address]    Report Status PENDING  Incomplete  Aerobic/Anaerobic Culture (surgical/deep wound)     Status: None (Preliminary result)   Collection Time: 12/23/19  3:15 PM   Specimen: Soft Tissue, Other  Result Value Ref Range Status   Specimen Description TISSUE LEFT HIP  Final   Special Requests FISTULA  Final   Gram Stain   Final     RARE WBC PRESENT, PREDOMINANTLY MONONUCLEAR NO ORGANISMS SEEN    Culture   Final    NO GROWTH 3 DAYS Performed at [facility], [place] [address] [place], [place] [address]    Report Status PENDING  Incomplete  Aerobic/Anaerobic Culture (surgical/deep wound)     Status: None (Preliminary result)   Collection Time: 12/23/19  3:32 PM   Specimen: Soft Tissue, Other  Result Value Ref Range Status   Specimen Description TISSUE  Final   Special Requests FROM BEHIND LEFT ACETABULUM  Final   Gram Stain   Final    RARE WBC PRESENT,BOTH PMN AND MONONUCLEAR NO ORGANISMS SEEN    Culture   Final    NO GROWTH 3 DAYS Performed at [facility], [place] [address], [place], [place] [address]    Report Status PENDING  Incomplete    Michel Bickers, MD [facility] for [facility] [phone] pager   [phone] cell 12/26/2019, 10:54 AM

## 2019-12-26 NOTE — Progress Notes (Signed)
   Subjective:  Patient reports pain as mild.    Objective:   VITALS:   Vitals:   12/25/19 1241 12/25/19 2105 12/26/19 0635 12/26/19 0754  BP: 119/62 113/61 117/65 115/62  Pulse: 97 (!) 110  89  Resp: 17 18 16 16   Temp: 98.6 F (37 C) 99.6 F (37.6 C) 98.9 F (37.2 C) 98.1 F (36.7 C)  TempSrc:  Oral Oral Oral  SpO2: 100% 97% 98% 99%  Weight:      Height:        Neurovascular intact Sensation intact distally Intact pulses distally Dorsiflexion/Plantar flexion intact Incision: dressing C/D/I and no drainage  iVAC intact   Lab Results  Component Value Date   WBC 7.1 12/24/2019   HGB 8.5 (L) 12/24/2019   HCT 26.9 (L) 12/24/2019   MCV 92.1 12/24/2019   PLT 293 12/24/2019     Assessment/Plan:  3 Days Post-Op   - appreciate wound nurse for troubleshooting VAC yesterday - awaiting final recs from ID - will plan for IV abx and then chronic suppression with po abx - posterior hip precautions x 3 months   Eduard Roux 12/26/2019, 7:56 AM [phone]

## 2019-12-26 NOTE — Progress Notes (Signed)
Discharge instructions addressed;Pt in stable condition;PICC line clean,dry & intact; Rolling walker & BSC supplied: Family member will pick up the pt at the [facility] [employer] entrance.

## 2019-12-26 NOTE — Progress Notes (Signed)
Physical Therapy Treatment Patient Details Name: Kristin [name] MRN: [id number] DOB: 1956-06-12 Today's Date: 12/26/2019    History of Present Illness 64 y.o. female who presents for surgical treatment of chronic left total hip infection 12/23/19. Has had several operative procedures on L total hip since inital surgery in 2017( L THA 2017, L hip revision 07/2016). PMH RA, renal sclerosis, joint pain, HTN, HLD, epilepsy, back pain, anemia.    PT Comments    Pt presented in bed, reported she wanted to walk distance of apartment hallway. Pt completed bed mobility min(A) for LLE assistance, pt able to sit to stand min(A) for power up with RW. Pt amb 160' with transfers to toilet and seated rest breaks min guard. Pt reported husband would assist her when amb at home and navigating apartment hallway. Pt reeducated on precautions, pt verbalized precautions and maintained throughout session. Will continue to follow acutely.    Follow Up Recommendations  Home health PT     Equipment Recommendations  Rolling walker with 5" wheels;3in1 (PT)    Recommendations for Other Services       Precautions / Restrictions Precautions Precautions: Posterior Hip Precaution Booklet Issued: No Required Braces or Orthoses: Knee Immobilizer - Left Restrictions Weight Bearing Restrictions: Yes LLE Weight Bearing: Weight bearing as tolerated    Mobility  Bed Mobility Overal bed mobility: Needs Assistance Bed Mobility: Supine to Sit     Supine to sit: Min assist     General bed mobility comments: Pt required min(A) to maintain precautions and assist with moving LE to EOB, once at EOB pt able to sit with no complaints of pain or dizziness  Transfers Overall transfer level: Needs assistance Equipment used: Rolling walker (2 wheeled) Transfers: Sit to/from Stand Sit to Stand: Min assist         General transfer comment: Pt required min(A) to stand for power up and maintaining  precautions  Ambulation/Gait Ambulation/Gait assistance: Min guard Gait Distance (Feet): 160 Feet Assistive device: Rolling walker (2 wheeled) Gait Pattern/deviations: Decreased stride length;Wide base of support;Trunk flexed;Step-through pattern Gait velocity: decreased   General Gait Details: Pt able to amb 10' min guard to bathroom and 10' back to recliner. Pt amb in hallway 60', seated rest break, 80' min guard assist with RW   Stairs             Wheelchair Mobility    Modified Rankin (Stroke Patients Only)       Balance Overall balance assessment: Needs assistance   Sitting balance-Leahy Scale: Good       Standing balance-Leahy Scale: Poor Standing balance comment: Requires use of RW to stand and maintain balance                            Cognition Arousal/Alertness: Awake/alert Behavior During Therapy: WFL for tasks assessed/performed Overall Cognitive Status: Within Functional Limits for tasks assessed                                        Exercises      General Comments General comments (skin integrity, edema, etc.): wound dressing appeared WNL pre and post session. VSS during session.      Pertinent Vitals/Pain Pain Assessment: Faces Faces Pain Scale: Hurts a little bit Pain Descriptors / Indicators: Dull;Grimacing Pain Intervention(s): Limited activity within patient's tolerance;Monitored during session;Premedicated before session (Pt  reported she was given pain medication 1 hour prior to session)    Home Living                      Prior Function            PT Goals (current goals can now be found in the care plan section) Acute Rehab PT Goals Patient Stated Goal: Home, walk better PT Goal Formulation: With patient Time For Goal Achievement: 01/07/20 Potential to Achieve Goals: Good Progress towards PT goals: Progressing toward goals    Frequency    Min 5X/week      PT Plan Current plan  remains appropriate    Co-evaluation              AM-PAC PT "6 Clicks" Mobility   Outcome Measure  Help needed turning from your back to your side while in a flat bed without using bedrails?: None Help needed moving from lying on your back to sitting on the side of a flat bed without using bedrails?: None Help needed moving to and from a bed to a chair (including a wheelchair)?: A Little Help needed standing up from a chair using your arms (e.g., wheelchair or bedside chair)?: A Little Help needed to walk in hospital room?: A Little Help needed climbing 3-5 steps with a railing? : A Little 6 Click Score: 20    End of Session Equipment Utilized During Treatment: Gait belt Activity Tolerance: Patient tolerated treatment well Patient left: in chair;with call bell/phone within reach;with chair alarm set Nurse Communication: Mobility status PT Visit Diagnosis: Unsteadiness on feet (R26.81);Other abnormalities of gait and mobility (R26.89);Muscle weakness (generalized) (M62.81);Pain Pain - Right/Left: Left Pain - part of body: Hip     Time: 1201-1230 PT Time Calculation (min) (ACUTE ONLY): 29 min  Charges:  $Gait Training: 8-22 mins $Therapeutic Activity: 8-22 mins                     Kristin [name] 12/26/2019    Kristin [name] 12/26/2019, 1:30 PM

## 2019-12-26 NOTE — TOC Transition Note (Signed)
Transition of Care [facility]) - CM/SW Discharge Note   Patient Details  Name: Kristin [name] MRN: [id number] Date of Birth: 05/13/1956  [facility] CM/SW Contact:  Curlene Labrum, RN Phone Number: 12/26/2019, 1:18 PM   Clinical Narrative:     Case management met with the patient concerning transitions of care to home. [facility] will be meeting with the patient this evening around 5 pm prior to discharge.  [employer] was called to deliver the rolling walker and 3:1 today.  [facility] will be visiting with the patient in a few weeks once [name] will allow the patient to have PT.  Patient to be discharged today.  Final next level of care: [place] Barriers to Discharge: Continued Medical Work up   Patient Goals and CMS Choice Patient states their goals for this hospitalization and ongoing recovery are:: I ready to recover and get better. CMS Medicare.gov Compare Post Acute Care list provided to:: Patient Choice offered to / list presented to : Patient  Discharge Placement                       Discharge Plan and Services   Discharge Planning Services: CM Consult Post Acute Care Choice: Durable Medical Equipment            DME Agency: [facility]                  Social Determinants of Health (SDOH) Interventions     Readmission Risk Interventions Readmission Risk Prevention Plan 12/24/2019  Post Dischage Appt Complete  Medication Screening Complete  Transportation Screening Complete  Some recent data might be hidden

## 2019-12-26 NOTE — Plan of Care (Signed)

## 2019-12-27 LAB — AEROBIC/ANAEROBIC CULTURE W GRAM STAIN (SURGICAL/DEEP WOUND): Gram Stain: NONE SEEN

## 2019-12-27 NOTE — Progress Notes (Signed)
See culture result.  Thanks.

## 2019-12-28 LAB — AEROBIC/ANAEROBIC CULTURE W GRAM STAIN (SURGICAL/DEEP WOUND)
Culture: NO GROWTH
Culture: NO GROWTH

## 2019-12-29 LAB — AEROBIC/ANAEROBIC CULTURE W GRAM STAIN (SURGICAL/DEEP WOUND)
Culture: NO GROWTH
Culture: NO GROWTH

## 2019-12-31 ENCOUNTER — Other Ambulatory Visit: Payer: Self-pay | Admitting: Internal Medicine

## 2019-12-31 ENCOUNTER — Telehealth: Payer: Self-pay | Admitting: Internal Medicine

## 2019-12-31 DIAGNOSIS — T8452XD Infection and inflammatory reaction due to internal left hip prosthesis, subsequent encounter: Secondary | ICD-10-CM

## 2019-12-31 DIAGNOSIS — T8452XA Infection and inflammatory reaction due to internal left hip prosthesis, initial encounter: Secondary | ICD-10-CM | POA: Diagnosis not present

## 2019-12-31 MED ORDER — METRONIDAZOLE 500 MG PO TABS: 500.0000 mg | ORAL_TABLET | Freq: Three times a day (TID) | ORAL | 1 refills | Status: DC

## 2019-12-31 NOTE — Telephone Encounter (Signed)
1 of Kristin [name]'s 5 operative culture specimens grew Prevotella.  I am not sure if this is a contaminant or the actual cause of her left prosthetic hip infection.  I called and spoke with her.  She is doing reasonably well on vancomycin and rifampin.  I have added oral metronidazole.  She will follow-up with me in 2 weeks.

## 2020-01-01 ENCOUNTER — Encounter: Payer: Self-pay | Admitting: Orthopaedic Surgery

## 2020-01-01 ENCOUNTER — Ambulatory Visit (INDEPENDENT_AMBULATORY_CARE_PROVIDER_SITE_OTHER): Payer: Medicaid Other | Admitting: Orthopaedic Surgery

## 2020-01-01 DIAGNOSIS — T8452XD Infection and inflammatory reaction due to internal left hip prosthesis, subsequent encounter: Secondary | ICD-10-CM

## 2020-01-01 NOTE — Progress Notes (Signed)
Patient ID: Kristin [name], female   DOB: 05-13-1956, 64 y.o.   MRN: [id number]  [name] is 1 week status post left hip I&D and wound closure.  She is doing well overall.  Flagyl was recently added to her antibiotic regimen for Prevotella cells found on one of the cultures.  She is tolerating the IV antibiotics through the PICC line well.  She has an upcoming appointment with Dr. Megan Salon of infectious diseases in 2 weeks.  Her surgical incisions are all clean dry and intact.  There has been significant healing of the anterior thigh wound.  There is no drainage.  Dry dressings were placed today.  Discontinue knee immobilizer.  Continue posterior hip precautions.  Prescription for home health PT was provided today.  Recheck in a week for staple and suture removal.

## 2020-01-02 ENCOUNTER — Telehealth: Payer: Self-pay

## 2020-01-02 DIAGNOSIS — T8452XA Infection and inflammatory reaction due to internal left hip prosthesis, initial encounter: Secondary | ICD-10-CM | POA: Diagnosis not present

## 2020-01-02 NOTE — Telephone Encounter (Signed)
Faxed HHPT Orders to [facility]

## 2020-01-02 NOTE — Telephone Encounter (Signed)
Kristin [name], from [facility], called to notify RN of lab values. Reports that RBC: 2.46, Hgb: 7.1, Hct: 22.8. Forwarding to provider for review.   Manasseh Pittsley Lorita Officer, RN

## 2020-01-03 DIAGNOSIS — T8452XS Infection and inflammatory reaction due to internal left hip prosthesis, sequela: Secondary | ICD-10-CM | POA: Diagnosis not present

## 2020-01-07 ENCOUNTER — Encounter: Payer: Self-pay | Admitting: Orthopaedic Surgery

## 2020-01-07 ENCOUNTER — Ambulatory Visit (INDEPENDENT_AMBULATORY_CARE_PROVIDER_SITE_OTHER): Payer: Medicaid Other | Admitting: Orthopaedic Surgery

## 2020-01-07 DIAGNOSIS — T8452XD Infection and inflammatory reaction due to internal left hip prosthesis, subsequent encounter: Secondary | ICD-10-CM

## 2020-01-07 DIAGNOSIS — T8452XA Infection and inflammatory reaction due to internal left hip prosthesis, initial encounter: Secondary | ICD-10-CM | POA: Diagnosis not present

## 2020-01-07 MED ORDER — COLLAGENASE 250 UNIT/GM EX OINT: 1.0000 "application " | TOPICAL_OINTMENT | Freq: Every day | CUTANEOUS | 0 refills | Status: DC

## 2020-01-07 NOTE — Progress Notes (Signed)
Patient ID: Kristin [name], female   DOB: 1956-05-23, 64 y.o.   MRN: [id number]  Kristin [name] is a 2 weeks status post left hip I&D.  Overall doing well just taking Tylenol for the pain.  She is here for suture and staple removal.  These were removed without difficulty.  She does have a couple spots fibrinous exudative tissue.  I have sent in prescription for Santyl ointment for this.  We have also given her a tube of mupirocin to apply to the wounds if the Santyl is not covered by Medicaid.  I would like to recheck her in 2 weeks.  We have also reordered home health PT for her.

## 2020-01-08 ENCOUNTER — Telehealth: Payer: Self-pay | Admitting: Orthopaedic Surgery

## 2020-01-08 NOTE — Telephone Encounter (Signed)
I called patient and advised. 

## 2020-01-08 NOTE — Telephone Encounter (Signed)
Per [name], only anterior incision.

## 2020-01-08 NOTE — Telephone Encounter (Signed)
Pt called stating [name] gave her some cream and she wanted to make sure that it goes on all incisions? Pt would like a CB to confirm.   [phone]

## 2020-01-09 ENCOUNTER — Telehealth: Payer: Self-pay

## 2020-01-09 DIAGNOSIS — T8452XA Infection and inflammatory reaction due to internal left hip prosthesis, initial encounter: Secondary | ICD-10-CM | POA: Diagnosis not present

## 2020-01-09 NOTE — Telephone Encounter (Signed)
Received call from [place] with [facility] to report labs: Hemoglobin 7.6 RBC 2.86 Hct: 23.5 Forwarding to provider for review.

## 2020-01-09 NOTE — Telephone Encounter (Signed)
She has developed postoperative anemia but I note that this hemoglobin is actually up 0.5 g.  I recommend simply continuing her current antibiotic therapy and monitoring serial CBCs.

## 2020-01-10 DIAGNOSIS — T8452XS Infection and inflammatory reaction due to internal left hip prosthesis, sequela: Secondary | ICD-10-CM | POA: Diagnosis not present

## 2020-01-13 ENCOUNTER — Telehealth: Payer: Self-pay

## 2020-01-13 ENCOUNTER — Other Ambulatory Visit: Payer: Self-pay

## 2020-01-13 DIAGNOSIS — T8452XD Infection and inflammatory reaction due to internal left hip prosthesis, subsequent encounter: Secondary | ICD-10-CM

## 2020-01-13 DIAGNOSIS — F411 Generalized anxiety disorder: Secondary | ICD-10-CM | POA: Diagnosis not present

## 2020-01-13 NOTE — Telephone Encounter (Signed)
Order made

## 2020-01-13 NOTE — Telephone Encounter (Signed)
Yes let's do our PT office please. Thanks.

## 2020-01-13 NOTE — Telephone Encounter (Signed)
Per [place] patient has Medicaid and they can only see her once. Which he thinks she would benefit more  from  Outpatient instead.   Would you like me to put in referral for outpatient PT? If so, anywhere specific?

## 2020-01-14 ENCOUNTER — Other Ambulatory Visit: Payer: Self-pay

## 2020-01-14 ENCOUNTER — Ambulatory Visit (INDEPENDENT_AMBULATORY_CARE_PROVIDER_SITE_OTHER): Payer: Medicaid Other | Admitting: Internal Medicine

## 2020-01-14 ENCOUNTER — Encounter: Payer: Self-pay | Admitting: Internal Medicine

## 2020-01-14 ENCOUNTER — Ambulatory Visit (INDEPENDENT_AMBULATORY_CARE_PROVIDER_SITE_OTHER): Payer: Medicaid Other | Admitting: Bariatrics

## 2020-01-14 DIAGNOSIS — T8452XD Infection and inflammatory reaction due to internal left hip prosthesis, subsequent encounter: Secondary | ICD-10-CM | POA: Diagnosis not present

## 2020-01-14 DIAGNOSIS — T8452XA Infection and inflammatory reaction due to internal left hip prosthesis, initial encounter: Secondary | ICD-10-CM | POA: Diagnosis not present

## 2020-01-14 NOTE — Assessment & Plan Note (Signed)
She is improving following her latest debridement and 3 weeks of new antibiotic regimen.  Continue her current regimen through 02/04/2020 before deciding on a chronic suppressive oral regimen.

## 2020-01-14 NOTE — Progress Notes (Signed)
[facility] for Infectious Disease  Patient Active Problem List   Diagnosis Date Noted  . 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 Pruritus 10/30/2019  . Hypercalcemia 10/24/2019  . Body mass index 45.0-49.9, adult ([place]) 08/27/2019  . Intertrigo 07/09/2019  . Facial rash 07/27/2018  . Body mass index 40.0-44.9, adult ([place]) 02/15/2018  . Fatigue associated with anemia 02/07/2018  . Unspecified open wound, left hip, initial encounter 01/19/2018  . Wound of left leg, sequela 10/03/2016  . Status post left hip replacement 06/01/2016  . Sinus tachycardia 05/19/2016  . History of adenomatous polyp of colon 03/21/2016  . Diabetes ([place]) 01/21/2016  . Thyroid nodule 12/25/2014  . Infiltrate noted on imaging study   . Cervical spine arthritis 11/14/2014  . Primary osteoarthritis of left hip 11/14/2014  . Loss of consciousness ([place]) 11/14/2014  . Chest pain 03/26/2014  . Musculoskeletal chest and rib pain 03/26/2014  . Rib pain on right side 02/28/2014  . Groin pain 04/18/2013  . UNSPECIFIED RENAL SCLEROSIS 01/16/2009  . GERD, SEVERE 05/25/2007  . Morbid obesity ([place]) 04/03/2007  . Dyslipidemia 08/31/2006  . Major depression, recurrent ([place]) 08/31/2006  . 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 Former smoker 08/31/2006  . HYPERTENSION, BENIGN SYSTEMIC 08/31/2006  . GASTRIC ULCER ACUTE WITHOUT HEMORRHAGE 08/31/2006  . CONVULSIONS, SEIZURES, NOS 08/31/2006    Patient's Medications  New Prescriptions   No medications on file  Previous Medications   ACETAMINOPHEN (TYLENOL) 650 MG CR TABLET    Take 1,300 mg by mouth every 8 (eight) hours as needed for pain.   AMITRIPTYLINE (ELAVIL) 75 MG TABLET    Take 2 tablets (150 mg total) by mouth at bedtime.   ASCORBIC ACID (VITAMIN C) 250 MG CHEW    Chew 250 mg by mouth daily.   CHOLECALCIFEROL (VITAMIN D3) 25 MCG (1000 UNIT) TABLET    Take 1,000 Units by mouth daily.   COLLAGENASE (SANTYL)  OINTMENT    Apply 1 application topically daily.   FERROUS SULFATE 325 (65 FE) MG TABLET    Take 1 tablet (325 mg total) by mouth 2 (two) times daily.   HYDROCHLOROTHIAZIDE (HYDRODIURIL) 25 MG TABLET    Take 1 tablet (25 mg total) by mouth daily.   IBUPROFEN (ADVIL) 200 MG TABLET    Take 400 mg by mouth every 6 (six) hours as needed for moderate pain.   LEVOCETIRIZINE (XYZAL) 5 MG TABLET    Take 1 tablet (5 mg total) by mouth every evening.   METFORMIN (GLUCOPHAGE) 500 MG TABLET    TAKE ONE TABLET BY MOUTH ONCE DAILY WITH BREAKFAST, AFTER 2 WEEKS, TAKE AN EXTRA TABLET EACH EVENING TO GET TO 2 TABLETS DAILY.   METOPROLOL TARTRATE (LOPRESSOR) 25 MG TABLET    Take 1 tablet (25 mg total) by mouth 2 (two) times daily.   METRONIDAZOLE (FLAGYL) 500 MG TABLET    Take 1 tablet (500 mg total) by mouth 3 (three) times daily.   OMEPRAZOLE (PRILOSEC) 20 MG CAPSULE    Take 20 mg by mouth at bedtime.   RIFAMPIN (RIFADIN) 300 MG CAPSULE    Take 2 capsules (600 mg total) by mouth daily.   SIMVASTATIN (ZOCOR) 20 MG TABLET    Take 1 tablet (20 mg total) by mouth at bedtime.   TOPIRAMATE (TOPAMAX) 50 MG TABLET    Take 1 tablet (50 mg total) by mouth daily.   VANCOMYCIN IVPB  Inject 750 mg into the vein every 12 (twelve) hours. Indication: Chronic left prosthetic hip infection        First Dose: Yes Last Day of Therapy: 02/04/2020 Labs - Sunday/Monday:  CBC/D, BMP, and vancomycin trough. Labs - Thursday:  BMP and vancomycin trough Labs - Every other week:  ESR and CRP Method of administration:Elastomeric Method of administration may be changed at the discretion of the patient and/or caregiver's ability to self-administer the medication ordered.   ZINC GLUCONATE 50 MG TABLET    Take 50 mg by mouth daily.  Modified Medications   No medications on file  Discontinued Medications   No medications on file    Subjective: Kristin [name] is in for her hospital follow-up visit.  She is a 64 y.o. female who underwent left  total hip arthroplasty on 06/01/2016.  She developed acute infection with MRSA in January 2018.  She underwent incision and drainage with polyexchange and was discharged on IV vancomycin before converting to oral doxycycline.  She completed therapy in June 2018.  She has had intermittent boils form along her left hip incision since that time.  She has been treated with oral trimethoprim sulfamethoxazole.  She underwent scar revision on 01/19/2018.  No organisms were seen on Gram stain and cultures were negative.  Dr. Lyndee Leo Dillingham excised an 8 cm deep fistula on 07/17/2019.  There was no evidence of infection noted in the operative report and no cultures were obtained.  A CT scan this past February showed some sclerosis and fluid around the prosthesis.  An x-ray on 11/12/2019 showed lucency around the femoral stem.  She stopped taking trimethoprim sulfamethoxazole on 12/13/2019 and was admitted on 12/23/2019 with a plan for probable excision arthroplasty.  At the time of surgery the prosthesis was noted to be firmly in place with sclerotic bone.  She underwent debridement and polyexchange again.  No organisms were seen on stain and 1 operative culture grew rare Prevotella after she was discharged.  Initially sent her home on IV vancomycin and rifampin but added metronidazole after discharge.  She is tolerating her antibiotics well other than noting a bad taste with metronidazole.  She mixes it with applesauce and tolerates it fairly well.  Review of Systems: Review of Systems  Constitutional: Negative for fever.  Gastrointestinal: Negative for abdominal pain, diarrhea, nausea and vomiting.  Musculoskeletal: Positive for joint pain.    Past Medical History:  Diagnosis Date  . Allergy   . Anemia   . Anxiety   . Arthritis   . Back pain, chronic   . Borderline diabetes   . Bronchitis   . Constipation   . Cough   . Depression   . Epilepsy ([place])   . GERD (gastroesophageal reflux disease)   .  Hyperlipidemia   . Hypertension   . IBS (irritable bowel syndrome)   . Joint pain   . Obesity   . Palpitations   . Pneumonia 2016  . Pre-diabetes   . 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 Rheumatoid arthritis ([place])   . Seizures ([place])    last >10 yrs ago- recorded 01/18/18  . Somatization disorder   . Tobacco abuse     Social History   Tobacco Use  . Smoking status: Former Smoker    Packs/day: 0.25    Years: 0.50    Pack years: 0.12    Types: Cigarettes    Quit date: 01/27/2014    Years since quitting: 5.9  .  Smokeless tobacco: Never Used  Vaping Use  . Vaping Use: Never used  Substance Use Topics  . Alcohol use: No  . Drug use: No    Family History  Problem Relation Age of Onset  . Other Mother        failed surgery  . Heart Problems Father        poss MI, not sure:per pt  . Alzheimer's disease Maternal Grandmother   . Heart disease Maternal Grandfather   . Colon cancer Maternal Uncle        not sure age of onset    Allergies  Allergen Reactions  . Fluconazole Swelling and Other (See Comments)    Face swelling  . Robitussin (Alcohol Free) [Guaifenesin] Palpitations and Other (See Comments)    Chest pain    Objective: Vitals:   01/14/20 1505  BP: 122/78  Pulse: (!) 114  SpO2: 100%   There is no height or weight on file to calculate BMI.  Physical Exam Constitutional:      Comments: She is calm and pleasant.  Musculoskeletal:        General: Swelling present. No tenderness.     Comments: Her left hip incisions are healing nicely.  Skin:    Comments: Right arm PICC site looks good.  Psychiatric:        Mood and Affect: Mood normal.     Lab Results    Problem List Items Addressed This Visit      High   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[place])    She is improving following her latest debridement and 3 weeks of new antibiotic regimen.  Continue her current regimen through 02/04/2020 before deciding on a chronic suppressive  oral regimen.          Michel Bickers, MD [facility] [phone] pager   [phone] cell 01/14/2020, 3:26 PM

## 2020-01-15 ENCOUNTER — Telehealth: Payer: Self-pay

## 2020-01-15 NOTE — Telephone Encounter (Signed)
Patient calls nurse line regarding severe headaches. Patient states onset around 12/23/19 after surgery on left leg. Patient states that the pain started in a "band around her neck." Patient reports that headache is on both sides and that she has a history of tension headaches and has been experiencing some nausea. Patient denies photosensitivity or any changes in vision. Patient has been using tylenol every 8 hours for pain, with some relief.   Strict ED precautions given. Scheduled in clinic with Dr. Volanda Napoleon on Friday 01/17/20.   Forwarding to PCP and Dr. Carmell Austria, RN

## 2020-01-16 DIAGNOSIS — T8452XA Infection and inflammatory reaction due to internal left hip prosthesis, initial encounter: Secondary | ICD-10-CM | POA: Diagnosis not present

## 2020-01-17 ENCOUNTER — Other Ambulatory Visit: Payer: Self-pay

## 2020-01-17 ENCOUNTER — Encounter: Payer: Self-pay | Admitting: Family Medicine

## 2020-01-17 ENCOUNTER — Ambulatory Visit (INDEPENDENT_AMBULATORY_CARE_PROVIDER_SITE_OTHER): Payer: Medicaid Other | Admitting: Family Medicine

## 2020-01-17 DIAGNOSIS — R519 Headache, unspecified: Secondary | ICD-10-CM | POA: Diagnosis not present

## 2020-01-17 MED ORDER — CYCLOBENZAPRINE HCL 10 MG PO TABS: 5.0000 mg | ORAL_TABLET | Freq: Every day | ORAL | 0 refills | Status: DC

## 2020-01-17 NOTE — Progress Notes (Signed)
    SUBJECTIVE:   CHIEF COMPLAINT / HPI: Headaches  Patient reports headaches ongoing x3 weeks. She has been taking Tylenol every 8 hours with only 3 hours of relief. Reports similar symptoms with tension headaches but typically can take Tylenol at last every 8 hours. Denies any photophobia, visual changes or weakness. She reports that heating pad sometimes help. Recently had surgery on June 24 for left hip prosthetics and currently treated with IV and p.o. antibiotics. Recently infectious disease added Flagyl to her regime. Since addition of new medication patient reports headaches have worsened and are constant. She reports headache started on left neck and shoulder and radiated up to having global headaches.   PERTINENT  PMH / PSH:  Tension headaches Major depression Somatizations disorder  Convulsions Hypertension Diabetes   OBJECTIVE:   BP 138/72   Pulse 100   Ht 5\' 2"  (1.575 m)   Wt 244 lb (110.7 kg)   SpO2 98%   BMI 44.63 kg/m    General: Alert and oriented, no apparent distress  ENTM: No pharyengeal erythema Neck: Mild tenderness on left posterior aspect of neck and occipital area bilaterally Neuro: CN II-XII intact, motor and sensory wnl, gait normal  ASSESSMENT/PLAN:   Worsening headaches Headache likely secondary to tension headaches given tremors past. Patient did recently start Flagyl and per up-to-date 18% patient's treated with Flagyl can have headaches as a side effect therefore medication could be contributing factor. Less likely subarachnoid hemorrhage no acute symptoms. -Continue Tylenol as needed. -Continue ibuprofen as needed -We will try cyclobenzaprine 5 mg nightly -Strict return precautions provided -Encourage patient to discuss medications with infectious disease  -Follow-up with PCP as needed      Carollee Leitz, MD [place]

## 2020-01-17 NOTE — Patient Instructions (Addendum)
It was nice meeting you today!  Ordered Flexeril 5 mg to take at night. Continue Tylenol 1300mg  every 8 hours as needed You can take Ibuprofen 400 mg every 8 hours as needed If you have any questions or concerns, please feel free to call the clinic.   Be well,  Carollee Leitz, MD [facility]

## 2020-01-18 DIAGNOSIS — T8452XS Infection and inflammatory reaction due to internal left hip prosthesis, sequela: Secondary | ICD-10-CM | POA: Diagnosis not present

## 2020-01-20 DIAGNOSIS — T8452XA Infection and inflammatory reaction due to internal left hip prosthesis, initial encounter: Secondary | ICD-10-CM | POA: Diagnosis not present

## 2020-01-21 ENCOUNTER — Encounter: Payer: Self-pay | Admitting: Family Medicine

## 2020-01-21 DIAGNOSIS — R519 Headache, unspecified: Secondary | ICD-10-CM | POA: Insufficient documentation

## 2020-01-21 NOTE — Assessment & Plan Note (Signed)
Headache likely secondary to tension headaches given tremors past. Patient did recently start Flagyl and per up-to-date 18% patient's treated with Flagyl can have headaches as a side effect therefore medication could be contributing factor. Less likely subarachnoid hemorrhage no acute symptoms. -Continue Tylenol as needed. -Continue ibuprofen as needed -We will try cyclobenzaprine 5 mg nightly -Strict return precautions provided -Encourage patient to discuss medications with infectious disease  -Follow-up with PCP as needed

## 2020-01-23 ENCOUNTER — Ambulatory Visit (INDEPENDENT_AMBULATORY_CARE_PROVIDER_SITE_OTHER): Payer: Medicaid Other | Admitting: Orthopaedic Surgery

## 2020-01-23 ENCOUNTER — Encounter: Payer: Self-pay | Admitting: Orthopaedic Surgery

## 2020-01-23 DIAGNOSIS — T8452XD Infection and inflammatory reaction due to internal left hip prosthesis, subsequent encounter: Secondary | ICD-10-CM

## 2020-01-23 DIAGNOSIS — T8452XA Infection and inflammatory reaction due to internal left hip prosthesis, initial encounter: Secondary | ICD-10-CM | POA: Diagnosis not present

## 2020-01-25 DIAGNOSIS — T8452XS Infection and inflammatory reaction due to internal left hip prosthesis, sequela: Secondary | ICD-10-CM | POA: Diagnosis not present

## 2020-01-25 NOTE — Progress Notes (Signed)
Patient ID: Kristin [name], female   DOB: 25-May-1956, 64 y.o.   MRN: [id number]  Kristin [name] is coming in for a wound check today.  No changes.  Overall doing well.  Surgical scars are fully healed at this point.  She has an upcoming appointment with ID on August 3 to decide p.o. antibiotics for chronic suppression.  Overall very happy that her surgical wounds have healed.  Would like to recheck her in about 2 months with standing AP pelvis x-rays.

## 2020-01-27 ENCOUNTER — Other Ambulatory Visit: Payer: Self-pay

## 2020-01-27 ENCOUNTER — Ambulatory Visit: Payer: Medicaid Other | Attending: Orthopaedic Surgery | Admitting: Physical Therapy

## 2020-01-27 ENCOUNTER — Encounter: Payer: Self-pay | Admitting: Physical Therapy

## 2020-01-27 DIAGNOSIS — M25652 Stiffness of left hip, not elsewhere classified: Secondary | ICD-10-CM | POA: Diagnosis not present

## 2020-01-27 DIAGNOSIS — R262 Difficulty in walking, not elsewhere classified: Secondary | ICD-10-CM

## 2020-01-28 ENCOUNTER — Encounter: Payer: Self-pay | Admitting: Physical Therapy

## 2020-01-28 DIAGNOSIS — T8452XA Infection and inflammatory reaction due to internal left hip prosthesis, initial encounter: Secondary | ICD-10-CM | POA: Diagnosis not present

## 2020-01-28 NOTE — Therapy (Addendum)
[place], [place], [address] Phone: [phone]   Fax:  [phone]  Physical Therapy Evaluation  Patient Details  Name: Kristin [name] MRN: [id number] Date of Birth: December 10, 1955 Referring Provider (PT): Dr. Leandrew Koyanagi   Encounter Date: 01/27/2020   PT End of Session - 01/27/20 1728    Visit Number 1    Number of Visits 17    Date for PT Re-Evaluation 03/24/20    Authorization Type MCD    PT Start Time 1631    PT Stop Time 1713    PT Time Calculation (min) 42 min    Activity Tolerance Patient tolerated treatment well    Behavior During Therapy [facility] for tasks assessed/performed           Past Medical History:  Diagnosis Date  . Allergy   . Anemia   . Anxiety   . Arthritis   . Back pain, chronic   . Borderline diabetes   . Bronchitis   . Constipation   . Cough   . Depression   . Epilepsy ([place])   . GERD (gastroesophageal reflux disease)   . Hyperlipidemia   . Hypertension   . IBS (irritable bowel syndrome)   . Joint pain   . Obesity   . Palpitations   . Pneumonia 2016  . Pre-diabetes   . 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 Rheumatoid arthritis ([place])   . Seizures ([place])    last >10 yrs ago- recorded 01/18/18  . Somatization disorder   . Tobacco abuse     Past Surgical History:  Procedure Laterality Date  . ABDOMINAL HYSTERECTOMY    . ANTERIOR HIP REVISION Left 07/25/2016   Procedure: LEFT HIP IRRIGATION AND DEBRIDEMENT, REVISION OF HEAD AND LINER;  Surgeon: Leandrew Koyanagi, MD;  Location: [place];  Service: Orthopedics;  Laterality: Left;  . APPLICATION OF WOUND VAC Left 12/23/2019   Procedure: APPLICATION OF WOUND VAC;  Surgeon: Leandrew Koyanagi, MD;  Location: [place];  Service: Orthopedics;  Laterality: Left;  . BREAST EXCISIONAL BIOPSY Right   . CHOLECYSTECTOMY    . COLONOSCOPY    . DEBRIDEMENT AND CLOSURE WOUND Left 07/17/2019   Procedure: Excision of left leg wound  with ACell placement and primary closure;  Surgeon: Wallace Going, DO;  Location: [place];  Service: Plastics;  Laterality: Left;  60 min  . EXCISIONAL TOTAL HIP ARTHROPLASTY WITH ANTIBIOTIC SPACERS Left 12/23/2019   Procedure: LEFT HIP IRRIGATION AND DEBRIDEMENT LEFT HIP WOUND and poly exchange ;  Surgeon: Leandrew Koyanagi, MD;  Location: [place];  Service: Orthopedics;  Laterality: Left;  . HIP DEBRIDEMENT Left 01/19/2018   Procedure(s) Performed: IRRIGATION AND DEBRIDEMENT LEFT HIP (Left )  . INCISION AND DRAINAGE HIP Left 08/03/2016   Procedure: IRRIGATION AND DEBRIDEMENT LEFT HIP, VAC PLACEMENT;  Surgeon: Leandrew Koyanagi, MD;  Location: [place];  Service: Orthopedics;  Laterality: Left;  . INCISION AND DRAINAGE HIP Left 01/19/2018   Procedure: IRRIGATION AND DEBRIDEMENT LEFT HIP;  Surgeon: Leandrew Koyanagi, MD;  Location: [place];  Service: Orthopedics;  Laterality: Left;  . kidney stent Right 2002  . TONSILLECTOMY Bilateral 12/06/2014   Procedure: TONSILLECTOMY, BILATERAL;  Surgeon: Ruby Cola, MD;  Location: [place];  Service: ENT;  Laterality: Bilateral;  . TONSILLECTOMY    . TOTAL HIP ARTHROPLASTY Left 06/01/2016   Procedure: LEFT TOTAL HIP ARTHROPLASTY ANTERIOR APPROACH;  Surgeon: Georga Kaufmann  Ephriam Jenkins, MD;  Location: [place];  Service: Orthopedics;  Laterality: Left;  . TUBAL LIGATION    . WOUND EXPLORATION Left 01/19/2018   Procedure: WOUND EXPLORATION;  Surgeon: Leandrew Koyanagi, MD;  Location: [place];  Service: Orthopedics;  Laterality: Left;    There were no vitals filed for this visit.    Subjective Assessment - 01/27/20 1715    Subjective Pt is a 64 y.o. female presents to outpatient PT L hip stiffness. She had a left THA in 2017, but has had multiple infections over the past 4 years. She had an I&D on 12/23/2019. Patient states she has no pain, but stiffness.    Pertinent History depression, obesity, arthritis    Limitations Sitting;Standing;Walking    How long can you sit  comfortably? stiff after sitting for a long time    How long can you stand comfortably? < 10 min    How long can you walk comfortably? limited community distances    Diagnostic tests Xray in 2020; MRI    Patient Stated Goals get back to walking    Currently in Pain? No/denies   states no pain just stiffness and tightness   Pain Location Hip    Pain Orientation Left    Pain Descriptors / Indicators --   when it does hurt   Pain Type Post Delivery (post partum)    Pain Onset 1 to 4 weeks ago    Pain Frequency Intermittent    Aggravating Factors  standing, walking, sitting    Pain Relieving Factors rest    Effect of Pain on Daily Activities difficulty perfroming ADL;s              [facility] PT Assessment - 01/28/20 0001      Assessment   Medical Diagnosis Left Hip I and D  (posterior percuations. ( 2017 anterior total hip)     Referring Provider (PT) Dr. Leandrew Koyanagi    Onset Date/Surgical Date 12/23/19    Hand Dominance Right    Next MD Visit August 3    Prior Therapy yes      Precautions   Precautions Posterior Hip    Precaution Comments Educated pt on posterior hip precautions      Restrictions   Weight Bearing Restrictions No      Balance Screen   Has the patient fallen in the past 6 months No    Has the patient had a decrease in activity level because of a fear of falling?  No    Is the patient reluctant to leave their home because of a fear of falling?  No      Home Environment   Additional Comments No stairs      Prior Function   Level of Independence Independent    Vocation Unemployed    Leisure walking      Cognition   Overall Cognitive Status Within Functional Limits for tasks assessed    Attention Focused    Memory Appears intact    Awareness Appears intact    Problem Solving Appears intact      Observation/Other Assessments   Focus on Therapeutic Outcomes (FOTO)  MCD      Sensation   Light Touch Appears Intact    Proprioception Appears Intact       Coordination   Gross Motor Movements are Fluid and Coordinated Yes    Fine Motor Movements are Fluid and Coordinated Yes      Posture/Postural Control   Posture  Comments flecxed trunk in stsanding;       AROM   Overall AROM Comments Not tested this visit       PROM   Left Hip Flexion 45      Strength   Right Hip Flexion 4/5    Right Hip ABduction 4+/5    Right Hip ADduction 4+/5    Left Hip Flexion 4-/5    Left Hip ABduction 4/5    Left Hip ADduction 4+/5    Right Knee Flexion 4+/5    Right Knee Extension 4+/5    Left Knee Flexion 4+/5    Left Knee Extension 4+/5      Palpation   Palpation comment No unexpected tenderness to palpation       Transfers   Comments uses her hands for sit to stand transfer       Ambulation/Gait   Gait Comments decreaed bilateral hip flexion; flexed trunk; decreaed stride length                       Objective measurements completed on examination: See above findings.       [place] Adult PT Treatment/Exercise - 01/28/20 0001      Exercises   Exercises Knee/Hip      Knee/Hip Exercises: Stretches   Other Knee/Hip Stretches Heel slides with strap x10   work on hip flexion     Knee/Hip Exercises: Supine   Quad Sets 10 reps    Other Supine Knee/Hip Exercises Glute sets 10 reps                  PT Education - 01/27/20 1723    Education Details HEP; posterior hip precautions    Person(s) Educated Patient    Methods Explanation;Demonstration;Tactile cues;Verbal cues    Comprehension Tactile cues required;Verbal cues required;Returned demonstration;Verbalized understanding            PT Short Term Goals - 01/27/20 1744      PT SHORT TERM GOAL #1   Title Patient will increase L hip strength to 5/5 in all directions.    Baseline left hip flexion 4-/5 left hip abduction 4/5 left    Time 3    Period Weeks    Status New    Target Date 02/17/20      PT SHORT TERM GOAL #2   Title Patient will improve hip flexion  PROM to 60 degrees to decrease stiffness in sitting.    Baseline 40 degrees    Time 3    Period Weeks    Status New    Target Date 02/17/20      PT SHORT TERM GOAL #3   Title Patient will transfer sit to stand without the use of her hands    Baseline uses hands to transfer sit to stand    Time 3    Period Weeks    Status New    Target Date 02/18/20      PT SHORT TERM GOAL #4   Title Patient will indepdnently state hip percautions    Baseline did not know hip percuations    Time 3    Period Weeks    Status New    Target Date 02/18/20             PT Long Term Goals - 01/27/20 1745      PT LONG TERM GOAL #1   Title Pt will be able to ambulate 1000' with LRAD to get back to  walking, per patient goal.    Baseline using a walker. Only able to walk limited distances with the walker    Time 6    Period Weeks    Status New    Target Date 03/09/20      PT LONG TERM GOAL #2   Title Patient will be able to properly ascend and descend stairs with no reports of pain to be able to ambulate around her house and community.    Baseline needs hands and uses her right leg    Time 6    Period Weeks    Status New    Target Date 03/09/20      PT LONG TERM GOAL #3   Title Patient will demonstrate 90 degrees of hip flexion in order to sit on her soft couch comfortably    Baseline 40 degrees    Time 6    Period Weeks    Status New    Target Date 03/10/20                  Plan - 01/27/20 1733    Clinical Impression Statement Pt is a 64 y.o. s/p left hip I&D on 12/23/2019. She had a L THA in 2017, but has had multiple infections since. She has limited left hip flexion at 45 degrees decreased strength on the LLE. At home she is currently using her walker, but does have a cane available. Therapy reviewed posterior hip precautions.. Patient would benefit from skilled therapy to improve hip ROM and strength to be able to do IADL's without a device.    Personal Factors and  Comorbidities Comorbidity 3+;Time since onset of injury/illness/exacerbation    Comorbidities history of recurrent infection, obesity, depression    Examination-Activity Limitations Stand;Squat;Sit    Stability/Clinical Decision Making Evolving/Moderate complexity    Clinical Decision Making Moderate    Rehab Potential Good    PT Frequency 2x / week    PT Duration 6 weeks    PT Treatment/Interventions ADLs/Self Care Home Management;Cryotherapy;Electrical Stimulation;Ultrasound;Traction;Moist Heat;Iontophoresis 4mg /ml Dexamethasone;Gait training;Stair training;Functional mobility training;Therapeutic activities;Therapeutic exercise;Balance training;Neuromuscular re-education;Manual techniques;Patient/family education;Passive range of motion;Dry needling;Joint Manipulations;Spinal Manipulations;Vasopneumatic Device;Taping    PT Next Visit Plan continue hip ROM stretching, hip and quad strengthening; gait training    PT Home Exercise Plan glute sets, quad sets, heel slides    Consulted and Agree with Plan of Care Patient           Patient will benefit from skilled therapeutic intervention in order to improve the following deficits and impairments:  Abnormal gait, Decreased range of motion, Difficulty walking, Decreased mobility, Decreased strength, Decreased activity tolerance, Decreased knowledge of precautions  Visit Diagnosis: Stiffness of left hip, not elsewhere classified - Plan: PT plan of care cert/re-cert  Difficulty in walking, not elsewhere classified - Plan: PT plan of care cert/re-cert     Problem List Patient Active Problem List   Diagnosis Date Noted  . Worsening headaches 01/21/2020  . Pruritus 10/30/2019  . Hypercalcemia 10/24/2019  . Body mass index 45.0-49.9, adult ([place]) 08/27/2019  . Intertrigo 07/09/2019  . Facial rash 07/27/2018  . Body mass index 40.0-44.9, adult ([place]) 02/15/2018  . Fatigue associated with anemia 02/07/2018  . Unspecified open wound, left hip,  initial encounter 01/19/2018  . Wound of left leg, sequela 10/03/2016  . 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 Status post left hip replacement 06/01/2016  . Sinus tachycardia 05/19/2016  . History of adenomatous polyp of colon 03/21/2016  .  Diabetes ([place]) 01/21/2016  . Thyroid nodule 12/25/2014  . Infiltrate noted on imaging study   . Cervical spine arthritis 11/14/2014  . Primary osteoarthritis of left hip 11/14/2014  . Loss of consciousness ([place]) 11/14/2014  . Chest pain 03/26/2014  . Musculoskeletal chest and rib pain 03/26/2014  . Rib pain on right side 02/28/2014  . Groin pain 04/18/2013  . UNSPECIFIED RENAL SCLEROSIS 01/16/2009  . GERD, SEVERE 05/25/2007  . Morbid obesity ([place]) 04/03/2007  . Dyslipidemia 08/31/2006  . Major depression, recurrent ([place]) 08/31/2006  . 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 Former smoker 08/31/2006  . HYPERTENSION, BENIGN SYSTEMIC 08/31/2006  . GASTRIC ULCER ACUTE WITHOUT HEMORRHAGE 08/31/2006  . CONVULSIONS, SEIZURES, NOS 08/31/2006    Carney Living PT DPT  01/28/2020, 12:21 PM   Minna Merritts SPT  01/28/2020  During this treatment session, the therapist was present, participating in and directing the treatment.    [place], [place], [address] Phone: [phone]   Fax:  [phone]  Name: Kristin [name] MRN: [id number] Date of Birth: 1955/12/18

## 2020-01-28 NOTE — Patient Instructions (Signed)
Access Code: [email or web address] Prepared by: Carolyne Littles  Program Notes   No crossing your legs  No bending past 90 degrees   No toe in position   Exercises .Supine Quad Set - 1 x daily - 7 x weekly - 3 sets - 10 reps .Hooklying Gluteal Sets - 1 x daily - 7 x weekly - 3 sets - 10 reps .Supine Heel Slide with Strap - 1 x daily - 7 x weekly - 3 sets - 10 reps

## 2020-01-30 DIAGNOSIS — T8452XA Infection and inflammatory reaction due to internal left hip prosthesis, initial encounter: Secondary | ICD-10-CM | POA: Diagnosis not present

## 2020-02-01 DIAGNOSIS — T8452XS Infection and inflammatory reaction due to internal left hip prosthesis, sequela: Secondary | ICD-10-CM | POA: Diagnosis not present

## 2020-02-02 DIAGNOSIS — T8452XA Infection and inflammatory reaction due to internal left hip prosthesis, initial encounter: Secondary | ICD-10-CM | POA: Diagnosis not present

## 2020-02-05 ENCOUNTER — Other Ambulatory Visit: Payer: Self-pay

## 2020-02-05 ENCOUNTER — Encounter: Payer: Self-pay | Admitting: Internal Medicine

## 2020-02-05 ENCOUNTER — Ambulatory Visit (INDEPENDENT_AMBULATORY_CARE_PROVIDER_SITE_OTHER): Payer: Medicaid Other | Admitting: Internal Medicine

## 2020-02-05 DIAGNOSIS — T8452XD Infection and inflammatory reaction due to internal left hip prosthesis, subsequent encounter: Secondary | ICD-10-CM

## 2020-02-05 MED ORDER — DOXYCYCLINE HYCLATE 100 MG PO TABS: 100.0000 mg | ORAL_TABLET | Freq: Two times a day (BID) | ORAL | 11 refills | Status: DC

## 2020-02-05 NOTE — Assessment & Plan Note (Signed)
It is difficult to know what the optimal long-term suppressive oral antibiotic therapy should be given the incongruent culture results over the past 3 years.  My guess is that her current flareup is due to MRSA rather than rare Prevotella.  I discussed the uncertainty with her.  My recommendation is to discontinue her current 3 drug antibiotic regimen, remove her PICC and put her on oral doxycycline long-term.  She is in agreement with that plan.  She will follow-up here in 6 weeks.

## 2020-02-05 NOTE — Progress Notes (Signed)
[place] for Infectious Disease  Patient Active Problem List   Diagnosis Date Noted  . 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 Worsening headaches 01/21/2020  . Pruritus 10/30/2019  . Hypercalcemia 10/24/2019  . Body mass index 45.0-49.9, adult ([place]) 08/27/2019  . Intertrigo 07/09/2019  . Facial rash 07/27/2018  . Body mass index 40.0-44.9, adult ([place]) 02/15/2018  . Fatigue associated with anemia 02/07/2018  . Unspecified open wound, left hip, initial encounter 01/19/2018  . Wound of left leg, sequela 10/03/2016  . Status post left hip replacement 06/01/2016  . Sinus tachycardia 05/19/2016  . History of adenomatous polyp of colon 03/21/2016  . Diabetes ([place]) 01/21/2016  . Thyroid nodule 12/25/2014  . Infiltrate noted on imaging study   . Cervical spine arthritis 11/14/2014  . Primary osteoarthritis of left hip 11/14/2014  . Loss of consciousness ([place]) 11/14/2014  . Chest pain 03/26/2014  . Musculoskeletal chest and rib pain 03/26/2014  . Rib pain on right side 02/28/2014  . Groin pain 04/18/2013  . UNSPECIFIED RENAL SCLEROSIS 01/16/2009  . GERD, SEVERE 05/25/2007  . Morbid obesity ([place]) 04/03/2007  . Dyslipidemia 08/31/2006  . Major depression, recurrent ([place]) 08/31/2006  . 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 Former smoker 08/31/2006  . HYPERTENSION, BENIGN SYSTEMIC 08/31/2006  . GASTRIC ULCER ACUTE WITHOUT HEMORRHAGE 08/31/2006  . CONVULSIONS, SEIZURES, NOS 08/31/2006    Patient's Medications  New Prescriptions   DOXYCYCLINE (VIBRA-TABS) 100 MG TABLET    Take 1 tablet (100 mg total) by mouth 2 (two) times daily.  Previous Medications   ACETAMINOPHEN (TYLENOL) 650 MG CR TABLET    Take 1,300 mg by mouth every 8 (eight) hours as needed for pain.   AMITRIPTYLINE (ELAVIL) 75 MG TABLET    Take 2 tablets (150 mg total) by mouth at bedtime.   ASCORBIC ACID (VITAMIN C) 250 MG CHEW    Chew 250 mg by mouth daily.    CHOLECALCIFEROL (VITAMIN D3) 25 MCG (1000 UNIT) TABLET    Take 1,000 Units by mouth daily.   COLLAGENASE (SANTYL) OINTMENT    Apply 1 application topically daily.   CYCLOBENZAPRINE (FLEXERIL) 10 MG TABLET    Take 0.5 tablets (5 mg total) by mouth at bedtime.   FERROUS SULFATE 325 (65 FE) MG TABLET    Take 1 tablet (325 mg total) by mouth 2 (two) times daily.   HYDROCHLOROTHIAZIDE (HYDRODIURIL) 25 MG TABLET    Take 1 tablet (25 mg total) by mouth daily.   IBUPROFEN (ADVIL) 200 MG TABLET    Take 400 mg by mouth every 6 (six) hours as needed for moderate pain.   LEVOCETIRIZINE (XYZAL) 5 MG TABLET    Take 1 tablet (5 mg total) by mouth every evening.   METFORMIN (GLUCOPHAGE) 500 MG TABLET    TAKE ONE TABLET BY MOUTH ONCE DAILY WITH BREAKFAST, AFTER 2 WEEKS, TAKE AN EXTRA TABLET EACH EVENING TO GET TO 2 TABLETS DAILY.   METOPROLOL TARTRATE (LOPRESSOR) 25 MG TABLET    Take 1 tablet (25 mg total) by mouth 2 (two) times daily.   OMEPRAZOLE (PRILOSEC) 20 MG CAPSULE    Take 20 mg by mouth at bedtime.   SIMVASTATIN (ZOCOR) 20 MG TABLET    Take 1 tablet (20 mg total) by mouth at bedtime.   TOPIRAMATE (TOPAMAX) 50 MG TABLET    Take 1 tablet (50 mg total) by mouth daily.   ZINC GLUCONATE 50 MG  TABLET    Take 50 mg by mouth daily.  Modified Medications   No medications on file  Discontinued Medications   METRONIDAZOLE (FLAGYL) 500 MG TABLET    Take 1 tablet (500 mg total) by mouth 3 (three) times daily.   RIFAMPIN (RIFADIN) 300 MG CAPSULE    Take 2 capsules (600 mg total) by mouth daily.   VANCOMYCIN IVPB    Inject 750 mg into the vein every 12 (twelve) hours. Indication: Chronic left prosthetic hip infection        First Dose: Yes Last Day of Therapy: 02/04/2020 Labs - Sunday/Monday:  CBC/D, BMP, and vancomycin trough. Labs - Thursday:  BMP and vancomycin trough Labs - Every other week:  ESR and CRP Method of administration:Elastomeric Method of administration may be changed at the discretion of the patient  and/or caregiver's ability to self-administer the medication ordered.    Subjective: Kristin [name] is in for her hospital follow-up visit.  She is a 64 y.o. female who underwent left total hip arthroplasty on 06/01/2016.  She developed acute infection with MRSA in January 2018.  She underwent incision and drainage with polyexchange and was discharged on IV vancomycin before converting to oral doxycycline.  She completed therapy in June 2018.  She has had intermittent boils form along her left hip incision since that time.  She has been treated with oral trimethoprim sulfamethoxazole.  She underwent scar revision on 01/19/2018.  No organisms were seen on Gram stain and cultures were negative.  Dr. Lyndee Leo Dillingham excised an 8 cm deep fistula on 07/17/2019.  There was no evidence of infection noted in the operative report and no cultures were obtained.  A CT scan this past February showed some sclerosis and fluid around the prosthesis.  An x-ray on 11/12/2019 showed lucency around the femoral stem.  She stopped taking trimethoprim sulfamethoxazole on 12/13/2019 and was admitted on 12/23/2019 with a plan for probable excision arthroplasty.  At the time of surgery the prosthesis was noted to be firmly in place with sclerotic bone.  She underwent debridement and polyexchange again.  No organisms were seen on stain and 1 [id number] operative cultures grew rare Prevotella after she was discharged.  Initially she was sent her home on IV vancomycin and rifampin but I added metronidazole after discharge.  She is tolerating her antibiotics well.  She has now completed 6 weeks of antibiotic therapy.  She has started physical therapy yesterday  Review of Systems: Review of Systems  Constitutional: Negative for fever.  Gastrointestinal: Negative for abdominal pain, diarrhea, nausea and vomiting.  Musculoskeletal: Positive for joint pain.    Past Medical History:  Diagnosis Date  . Allergy   . Anemia   . Anxiety   . Arthritis     . Back pain, chronic   . Borderline diabetes   . Bronchitis   . Constipation   . Cough   . Depression   . Epilepsy ([place]   . GERD (gastroesophageal reflux disease)   . Hyperlipidemia   . Hypertension   . IBS (irritable bowel syndrome)   . Joint pain   . Obesity   . Palpitations   . Pneumonia 2016  . Pre-diabetes   . 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 Rheumatoid arthritis ([place]   . Seizures ([place]    last >10 yrs ago- recorded 01/18/18  . Somatization disorder   . Tobacco abuse     Social History   Tobacco Use  .  Smoking status: Former Smoker    Packs/day: 0.25    Years: 0.50    Pack years: 0.12    Types: Cigarettes    Quit date: 01/27/2014    Years since quitting: 6.0  . Smokeless tobacco: Never Used  Vaping Use  . Vaping Use: Never used  Substance Use Topics  . Alcohol use: No  . Drug use: No    Family History  Problem Relation Age of Onset  . Other Mother        failed surgery  . Heart Problems Father        poss MI, not sure:per pt  . Alzheimer's disease Maternal Grandmother   . Heart disease Maternal Grandfather   . Colon cancer Maternal Uncle        not sure age of onset    Allergies  Allergen Reactions  . Fluconazole Swelling and Other (See Comments)    Face swelling  . Robitussin (Alcohol Free) [Guaifenesin] Palpitations and Other (See Comments)    Chest pain    Objective: Vitals:   02/05/20 1057  BP: 118/78  Pulse: 91  Temp: 98.5 F (36.9 C)  TempSrc: Oral  Weight: 237 lb (107.5 kg)   Body mass index is 43.35 kg/m.  Physical Exam Constitutional:      Comments: She is calm and pleasant.  She is seated in her wheelchair.  Musculoskeletal:        General: No swelling or tenderness.     Comments: Her left hip incisions have healed nicely  Skin:    Comments: Right arm PICC site looks good.  Psychiatric:        Mood and Affect: Mood normal.     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  My guess is that her current flareup is due to MRSA rather than rare Prevotella.  I discussed the uncertainty with her.  My recommendation is to discontinue her current 3 drug antibiotic regimen, remove her PICC and put her on oral doxycycline long-term.  She is in agreement with that plan.  She will follow-up here in 6 weeks.      Relevant Medications   doxycycline (VIBRA-TABS) 100 MG tablet       Michel Bickers, MD [facility] for Infectious [place] [phone] pager   [phone] cell 02/05/2020, 11:17 AM

## 2020-02-06 ENCOUNTER — Ambulatory Visit: Payer: Medicaid Other | Attending: Orthopaedic Surgery

## 2020-02-06 DIAGNOSIS — R262 Difficulty in walking, not elsewhere classified: Secondary | ICD-10-CM | POA: Insufficient documentation

## 2020-02-06 DIAGNOSIS — M6281 Muscle weakness (generalized): Secondary | ICD-10-CM | POA: Diagnosis not present

## 2020-02-06 DIAGNOSIS — M25652 Stiffness of left hip, not elsewhere classified: Secondary | ICD-10-CM | POA: Insufficient documentation

## 2020-02-06 NOTE — Therapy (Signed)
[place], [place], [address] Phone: [phone]   Fax:  [phone]  Physical Therapy Treatment  Patient Details  Name: Kristin [name] MRN: [id number] Date of Birth: 03/02/1956 Referring Provider (PT): Dr. Leandrew Koyanagi   Encounter Date: 02/06/2020   PT End of Session - 02/06/20 1447    Visit Number 2    Number of Visits 17    Date for PT Re-Evaluation 03/24/20    Authorization Type MCD    PT Start Time 0246    PT Stop Time 0330    PT Time Calculation (min) 44 min    Activity Tolerance Patient tolerated treatment well    Behavior During Therapy [facility] for tasks assessed/performed           Past Medical History:  Diagnosis Date  . Allergy   . Anemia   . Anxiety   . Arthritis   . Back pain, chronic   . Borderline diabetes   . Bronchitis   . Constipation   . Cough   . Depression   . Epilepsy ([place])   . GERD (gastroesophageal reflux disease)   . Hyperlipidemia   . Hypertension   . IBS (irritable bowel syndrome)   . Joint pain   . Obesity   . Palpitations   . Pneumonia 2016  . Pre-diabetes   . 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 Rheumatoid arthritis ([place])   . Seizures ([place])    last >10 yrs ago- recorded 01/18/18  . Somatization disorder   . Tobacco abuse     Past Surgical History:  Procedure Laterality Date  . ABDOMINAL HYSTERECTOMY    . ANTERIOR HIP REVISION Left 07/25/2016   Procedure: LEFT HIP IRRIGATION AND DEBRIDEMENT, REVISION OF HEAD AND LINER;  Surgeon: Leandrew Koyanagi, MD;  Location: [place];  Service: Orthopedics;  Laterality: Left;  . APPLICATION OF WOUND VAC Left 12/23/2019   Procedure: APPLICATION OF WOUND VAC;  Surgeon: Leandrew Koyanagi, MD;  Location: [place];  Service: Orthopedics;  Laterality: Left;  . BREAST EXCISIONAL BIOPSY Right   . CHOLECYSTECTOMY    . COLONOSCOPY    . DEBRIDEMENT AND CLOSURE WOUND Left 07/17/2019   Procedure: Excision of left leg wound  with ACell placement and primary closure;  Surgeon: Wallace Going, DO;  Location: [place];  Service: Plastics;  Laterality: Left;  60 min  . EXCISIONAL TOTAL HIP ARTHROPLASTY WITH ANTIBIOTIC SPACERS Left 12/23/2019   Procedure: LEFT HIP IRRIGATION AND DEBRIDEMENT LEFT HIP WOUND and poly exchange ;  Surgeon: Leandrew Koyanagi, MD;  Location: [place];  Service: Orthopedics;  Laterality: Left;  . HIP DEBRIDEMENT Left 01/19/2018   Procedure(s) Performed: IRRIGATION AND DEBRIDEMENT LEFT HIP (Left )  . INCISION AND DRAINAGE HIP Left 08/03/2016   Procedure: IRRIGATION AND DEBRIDEMENT LEFT HIP, VAC PLACEMENT;  Surgeon: Leandrew Koyanagi, MD;  Location: [place];  Service: Orthopedics;  Laterality: Left;  . INCISION AND DRAINAGE HIP Left 01/19/2018   Procedure: IRRIGATION AND DEBRIDEMENT LEFT HIP;  Surgeon: Leandrew Koyanagi, MD;  Location: [place];  Service: Orthopedics;  Laterality: Left;  . kidney stent Right 2002  . TONSILLECTOMY Bilateral 12/06/2014   Procedure: TONSILLECTOMY, BILATERAL;  Surgeon: Ruby Cola, MD;  Location: [place];  Service: ENT;  Laterality: Bilateral;  . TONSILLECTOMY    . TOTAL HIP ARTHROPLASTY Left 06/01/2016   Procedure: LEFT TOTAL HIP ARTHROPLASTY ANTERIOR APPROACH;  Surgeon: Georga Kaufmann  Ephriam Jenkins, MD;  Location: [place];  Service: Orthopedics;  Laterality: Left;  . TUBAL LIGATION    . WOUND EXPLORATION Left 01/19/2018   Procedure: WOUND EXPLORATION;  Surgeon: Leandrew Koyanagi, MD;  Location: [place];  Service: Orthopedics;  Laterality: Left;    There were no vitals filed for this visit.   Subjective Assessment - 02/06/20 1531    Subjective NO pain today generaly.  Do still need to becareful with leg crossed as it does sometimes in lying.    Currently in Pain? No/denies                             [facility] Adult PT Treatment/Exercise - 02/06/20 0001      Transfers   Comments uses hand to lift LT leg on to mat      Knee/Hip Exercises: Stretches   Hip Flexor Stretch  Left;2 reps    Hip Flexor Stretch Limitations off side of mat with knee flexion      Knee/Hip Exercises: Aerobic   Nustep L3 UE /LE   6 min      Knee/Hip Exercises: Seated   Long Arc Quad Left;20 reps    Long Arc Quad Weight 3 lbs.    Sit to Sand 3 sets;5 reps;without UE support      Knee/Hip Exercises: Supine   Quad Sets 10 reps    Short Arc Quad Sets Left;20 reps    Short Arc Quad Sets Limitations 3#    Heel Slides Left;10 reps    Other Supine Knee/Hip Exercises Glute sets 10 reps    Other Supine Knee/Hip Exercises Bent knee raise x 15 assisted x 5 , active x 10, hip abduction leg straight in supine.                   PT Education - 02/06/20 1528    Education Details Reviewed need for continuing posterior THA precautions    Person(s) Educated Patient    Methods Explanation    Comprehension Verbalized understanding            PT Short Term Goals - 01/27/20 1744      PT SHORT TERM GOAL #1   Title Patient will increase L hip strength to 5/5 in all directions.    Baseline left hip flexion 4-/5 left hip abduction 4/5 left    Time 3    Period Weeks    Status New    Target Date 02/17/20      PT SHORT TERM GOAL #2   Title Patient will improve hip flexion PROM to 60 degrees to decrease stiffness in sitting.    Baseline 40 degrees    Time 3    Period Weeks    Status New    Target Date 02/17/20      PT SHORT TERM GOAL #3   Title Patient will transfer sit to stand without the use of her hands    Baseline uses hands to transfer sit to stand    Time 3    Period Weeks    Status New    Target Date 02/18/20      PT SHORT TERM GOAL #4   Title Patient will indepdnently state hip percautions    Baseline did not know hip percuations    Time 3    Period Weeks    Status New    Target Date 02/18/20  PT Long Term Goals - 01/27/20 1745      PT LONG TERM GOAL #1   Title Pt will be able to ambulate 1000' with LRAD to get back to walking, per patient  goal.    Baseline using a walker. Only able to walk limited distances with the walker    Time 6    Period Weeks    Status New    Target Date 03/09/20      PT LONG TERM GOAL #2   Title Patient will be able to properly ascend and descend stairs with no reports of pain to be able to ambulate around her house and community.    Baseline needs hands and uses her right leg    Time 6    Period Weeks    Status New    Target Date 03/09/20      PT LONG TERM GOAL #3   Title Patient will demonstrate 90 degrees of hip flexion in order to sit on her soft couch comfortably    Baseline 40 degrees    Time 6    Period Weeks    Status New    Target Date 03/10/20                 Plan - 02/06/20 1457    Clinical Impression Statement She did well with active and resisted ROM. questions answered as able about precautions. No incr pain . Able to lift leg better in supine    PT Treatment/Interventions ADLs/Self Care Home Management;Cryotherapy;Electrical Stimulation;Ultrasound;Traction;Moist Heat;Iontophoresis 4mg /ml Dexamethasone;Gait training;Stair training;Functional mobility training;Therapeutic activities;Therapeutic exercise;Balance training;Neuromuscular re-education;Manual techniques;Patient/family education;Passive range of motion;Dry needling;Joint Manipulations;Spinal Manipulations;Vasopneumatic Device;Taping    PT Next Visit Plan continue hip ROM stretching, hip and quad strengthening; gait training  trial of cane    PT Home Exercise Plan glute sets, quad sets, heel slides    Consulted and Agree with Plan of Care Patient           Patient will benefit from skilled therapeutic intervention in order to improve the following deficits and impairments:  Abnormal gait, Decreased range of motion, Difficulty walking, Decreased mobility, Decreased strength, Decreased activity tolerance, Decreased knowledge of precautions  Visit Diagnosis: Difficulty in walking, not elsewhere  classified  Stiffness of left hip, not elsewhere classified     Problem List Patient Active Problem List   Diagnosis Date Noted  . Worsening headaches 01/21/2020  . Pruritus 10/30/2019  . Hypercalcemia 10/24/2019  . Body mass index 45.0-49.9, adult ([place]) 08/27/2019  . Intertrigo 07/09/2019  . Facial rash 07/27/2018  . Body mass index 40.0-44.9, adult ([place]) 02/15/2018  . Fatigue associated with anemia 02/07/2018  . Unspecified open wound, left hip, initial encounter 01/19/2018  . Wound of left leg, sequela 10/03/2016  . 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 Status post left hip replacement 06/01/2016  . Sinus tachycardia 05/19/2016  . History of adenomatous polyp of colon 03/21/2016  . Diabetes ([place]) 01/21/2016  . Thyroid nodule 12/25/2014  . Infiltrate noted on imaging study   . Cervical spine arthritis 11/14/2014  . Primary osteoarthritis of left hip 11/14/2014  . Loss of consciousness ([place]) 11/14/2014  . Chest pain 03/26/2014  . Musculoskeletal chest and rib pain 03/26/2014  . Rib pain on right side 02/28/2014  . Groin pain 04/18/2013  . UNSPECIFIED RENAL SCLEROSIS 01/16/2009  . GERD, SEVERE 05/25/2007  . Morbid obesity ([place]) 04/03/2007  . Dyslipidemia 08/31/2006  . Major depression, recurrent ([place]) 08/31/2006  . SOMATIZATION  DISORDER 08/31/2006  . Former smoker 08/31/2006  . HYPERTENSION, BENIGN SYSTEMIC 08/31/2006  . GASTRIC ULCER ACUTE WITHOUT HEMORRHAGE 08/31/2006  . CONVULSIONS, SEIZURES, NOS 08/31/2006    Darrel Hoover  PT 02/06/2020, 3:32 PM  [facility] [address] [place], [place], [address] Phone: [phone]   Fax:  [phone]  Name: MILITZA DEVERY MRN: [id number] Date of Birth: [birth year]

## 2020-02-06 NOTE — Progress Notes (Signed)
Per verbal order from Dr Megan Salon, 36 cm Single Lumen Peripherally Inserted Central Catheter removed from right basilic, tip intact. No sutures present. RN confirmed length per chart. Dressing was clean and dry. Petroleum dressing applied. Pt advised no heavy lifting with this arm, leave dressing for 24 hours and call the office or seek emergent care if dressing becomes soaked with blood or swelling or sharp pain presents. Patient verbalized understanding and agreement.  Patient's questions answered to their satisfaction. Patient tolerated procedure well, RN walked patient to check out. Pharmacy notified. Landis Gandy, RN

## 2020-02-10 ENCOUNTER — Encounter: Payer: Self-pay | Admitting: Physical Therapy

## 2020-02-10 ENCOUNTER — Other Ambulatory Visit: Payer: Self-pay

## 2020-02-10 ENCOUNTER — Ambulatory Visit: Payer: Medicaid Other | Admitting: Physical Therapy

## 2020-02-10 DIAGNOSIS — M25652 Stiffness of left hip, not elsewhere classified: Secondary | ICD-10-CM | POA: Diagnosis not present

## 2020-02-10 DIAGNOSIS — M6281 Muscle weakness (generalized): Secondary | ICD-10-CM | POA: Diagnosis not present

## 2020-02-10 DIAGNOSIS — R262 Difficulty in walking, not elsewhere classified: Secondary | ICD-10-CM | POA: Diagnosis not present

## 2020-02-10 NOTE — Therapy (Signed)
[place], [place], [address] Phone: [phone]   Fax:  [phone]  Physical Therapy Treatment  Patient Details  Name: Kristin [name] MRN: [id number] Date of Birth: 12-11-1955 Referring Provider (PT): Dr. Leandrew Koyanagi   Encounter Date: 02/10/2020   PT End of Session - 02/10/20 1108    Visit Number 3    Number of Visits 17    Date for PT Re-Evaluation 03/24/20    Authorization Type MCD    PT Start Time 1102    PT Stop Time 1141    PT Time Calculation (min) 39 min    Activity Tolerance Patient tolerated treatment well    Behavior During Therapy [facility] for tasks assessed/performed           Past Medical History:  Diagnosis Date  . Allergy   . Anemia   . Anxiety   . Arthritis   . Back pain, chronic   . Borderline diabetes   . Bronchitis   . Constipation   . Cough   . Depression   . Epilepsy ([place])   . GERD (gastroesophageal reflux disease)   . Hyperlipidemia   . Hypertension   . IBS (irritable bowel syndrome)   . Joint pain   . Obesity   . Palpitations   . Pneumonia 2016  . Pre-diabetes   . 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 Rheumatoid arthritis ([place])   . Seizures ([place])    last >10 yrs ago- recorded 01/18/18  . Somatization disorder   . Tobacco abuse     Past Surgical History:  Procedure Laterality Date  . ABDOMINAL HYSTERECTOMY    . ANTERIOR HIP REVISION Left 07/25/2016   Procedure: LEFT HIP IRRIGATION AND DEBRIDEMENT, REVISION OF HEAD AND LINER;  Surgeon: Leandrew Koyanagi, MD;  Location: [place];  Service: Orthopedics;  Laterality: Left;  . APPLICATION OF WOUND VAC Left 12/23/2019   Procedure: APPLICATION OF WOUND VAC;  Surgeon: Leandrew Koyanagi, MD;  Location: [place];  Service: Orthopedics;  Laterality: Left;  . BREAST EXCISIONAL BIOPSY Right   . CHOLECYSTECTOMY    . COLONOSCOPY    . DEBRIDEMENT AND CLOSURE WOUND Left 07/17/2019   Procedure: Excision of left leg wound  with ACell placement and primary closure;  Surgeon: Wallace Going, DO;  Location: [place];  Service: Plastics;  Laterality: Left;  60 min  . EXCISIONAL TOTAL HIP ARTHROPLASTY WITH ANTIBIOTIC SPACERS Left 12/23/2019   Procedure: LEFT HIP IRRIGATION AND DEBRIDEMENT LEFT HIP WOUND and poly exchange ;  Surgeon: Leandrew Koyanagi, MD;  Location: [place];  Service: Orthopedics;  Laterality: Left;  . HIP DEBRIDEMENT Left 01/19/2018   Procedure(s) Performed: IRRIGATION AND DEBRIDEMENT LEFT HIP (Left )  . INCISION AND DRAINAGE HIP Left 08/03/2016   Procedure: IRRIGATION AND DEBRIDEMENT LEFT HIP, VAC PLACEMENT;  Surgeon: Leandrew Koyanagi, MD;  Location: [place];  Service: Orthopedics;  Laterality: Left;  . INCISION AND DRAINAGE HIP Left 01/19/2018   Procedure: IRRIGATION AND DEBRIDEMENT LEFT HIP;  Surgeon: Leandrew Koyanagi, MD;  Location: [place];  Service: Orthopedics;  Laterality: Left;  . kidney stent Right 2002  . TONSILLECTOMY Bilateral 12/06/2014   Procedure: TONSILLECTOMY, BILATERAL;  Surgeon: Ruby Cola, MD;  Location: [place];  Service: ENT;  Laterality: Bilateral;  . TONSILLECTOMY    . TOTAL HIP ARTHROPLASTY Left 06/01/2016   Procedure: LEFT TOTAL HIP ARTHROPLASTY ANTERIOR APPROACH;  Surgeon: Georga Kaufmann  Ephriam Jenkins, MD;  Location: [place];  Service: Orthopedics;  Laterality: Left;  . TUBAL LIGATION    . WOUND EXPLORATION Left 01/19/2018   Procedure: WOUND EXPLORATION;  Surgeon: Leandrew Koyanagi, MD;  Location: [place];  Service: Orthopedics;  Laterality: Left;    There were no vitals filed for this visit.   Subjective Assessment - 02/10/20 1106    Subjective Pt reported no pain today. She was a little tight after last session, but is feeling good today. She tried to walk a little more over the weekend and it helped with her tightness.    Pertinent History depression, obesity, arthritis    Limitations Sitting;Standing;Walking    How long can you sit comfortably? stiff after sitting for a long time    How  long can you stand comfortably? < 10 min    How long can you walk comfortably? limited community distances    Diagnostic tests Xray in 2020; MRI    Patient Stated Goals get back to walking    Currently in Pain? No/denies    Pain Orientation Left    Pain Type Post Delivery (post partum)    Pain Onset 1 to 4 weeks ago    Aggravating Factors  standing, walking, sitting    Pain Relieving Factors rest    Effect of Pain on Daily Activities difficulty performing ADLs              [facility] PT Assessment - 02/10/20 0001      Assessment   Medical Diagnosis Left Hip I and D  (posterior percuations. ( 2017 anterior total hip)     Referring Provider (PT) Dr. Leandrew Koyanagi                         [facility] Adult PT Treatment/Exercise - 02/10/20 0001      Knee/Hip Exercises: Aerobic   Nustep L3 UE /LE 5 min      Knee/Hip Exercises: Standing   Heel Raises 2 sets;10 reps    Knee Flexion 20 reps   in walker   Hip Abduction 2 sets;10 reps;Left;Right      Knee/Hip Exercises: Seated   Long Arc Quad 2 sets;10 reps;Left;Right    Long Arc Quad Weight 3 lbs.    Sit to Sand 2 sets;5 reps;without UE support      Knee/Hip Exercises: Supine   Quad Sets 2 sets;10 reps    Short Arc Quad Sets 2 sets;10 reps    Heel Slides Left;10 reps    Bridges 2 sets;10 reps   mini bridges   Other Supine Knee/Hip Exercises Glute sets 20 reps    Other Supine Knee/Hip Exercises Bent knee raise x 15      Manual Therapy   Manual Therapy Passive ROM    Passive ROM into hip flexion    did not exceed 90 degrees                 PT Education - 02/10/20 1155    Education Details HEP, hip precautions    Person(s) Educated Patient    Methods Explanation    Comprehension Verbalized understanding;Returned demonstration            PT Short Term Goals - 01/27/20 1744      PT SHORT TERM GOAL #1   Title Patient will increase L hip strength to 5/5 in all directions.    Baseline left hip flexion 4-/5  left hip abduction 4/5  left    Time 3    Period Weeks    Status New    Target Date 02/17/20      PT SHORT TERM GOAL #2   Title Patient will improve hip flexion PROM to 60 degrees to decrease stiffness in sitting.    Baseline 40 degrees    Time 3    Period Weeks    Status New    Target Date 02/17/20      PT SHORT TERM GOAL #3   Title Patient will transfer sit to stand without the use of her hands    Baseline uses hands to transfer sit to stand    Time 3    Period Weeks    Status New    Target Date 02/18/20      PT SHORT TERM GOAL #4   Title Patient will indepdnently state hip percautions    Baseline did not know hip percuations    Time 3    Period Weeks    Status New    Target Date 02/18/20             PT Long Term Goals - 01/27/20 1745      PT LONG TERM GOAL #1   Title Pt will be able to ambulate 1000' with LRAD to get back to walking, per patient goal.    Baseline using a walker. Only able to walk limited distances with the walker    Time 6    Period Weeks    Status New    Target Date 03/09/20      PT LONG TERM GOAL #2   Title Patient will be able to properly ascend and descend stairs with no reports of pain to be able to ambulate around her house and community.    Baseline needs hands and uses her right leg    Time 6    Period Weeks    Status New    Target Date 03/09/20      PT LONG TERM GOAL #3   Title Patient will demonstrate 90 degrees of hip flexion in order to sit on her soft couch comfortably    Baseline 40 degrees    Time 6    Period Weeks    Status New    Target Date 03/10/20                 Plan - 02/10/20 1148    Clinical Impression Statement Pt is progressing well with therapy. Her hip flexion PROM has increased to 90 degrees and was not stretched further due to hip precautions. Therapy continued hip and glute strenghtening exercises. Progressed to standing strengthening exercises in walker and pt tolerated exercises well. Continue  to progress strengthening and begin gait training with cane.    Personal Factors and Comorbidities Comorbidity 3+;Time since onset of injury/illness/exacerbation    Comorbidities history of recurrent infection, obesity, depression    Examination-Activity Limitations Stand;Squat;Sit    Stability/Clinical Decision Making Evolving/Moderate complexity    Clinical Decision Making Moderate    Rehab Potential Good    PT Frequency 2x / week    PT Duration 6 weeks    PT Treatment/Interventions ADLs/Self Care Home Management;Cryotherapy;Electrical Stimulation;Ultrasound;Traction;Moist Heat;Iontophoresis 4mg /ml Dexamethasone;Gait training;Stair training;Functional mobility training;Therapeutic activities;Therapeutic exercise;Balance training;Neuromuscular re-education;Manual techniques;Patient/family education;Passive range of motion;Dry needling;Joint Manipulations;Spinal Manipulations;Vasopneumatic Device;Taping    PT Next Visit Plan continue hip ROM stretching, hip and quad strengthening; gait training trial of cane    PT Home Exercise Plan glute sets, quad  sets, heel slides    Consulted and Agree with Plan of Care Patient           Patient will benefit from skilled therapeutic intervention in order to improve the following deficits and impairments:  Abnormal gait, Decreased range of motion, Difficulty walking, Decreased mobility, Decreased strength, Decreased activity tolerance, Decreased knowledge of precautions  Visit Diagnosis: Difficulty in walking, not elsewhere classified  Stiffness of left hip, not elsewhere classified     Problem List Patient Active Problem List   Diagnosis Date Noted  . Worsening headaches 01/21/2020  . Pruritus 10/30/2019  . Hypercalcemia 10/24/2019  . Body mass index 45.0-49.9, adult ([place]) 08/27/2019  . Intertrigo 07/09/2019  . Facial rash 07/27/2018  . Body mass index 40.0-44.9, adult ([place]) 02/15/2018  . Fatigue associated with anemia 02/07/2018  .  Unspecified open wound, left hip, initial encounter 01/19/2018  . Wound of left leg, sequela 10/03/2016  . 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 Status post left hip replacement 06/01/2016  . Sinus tachycardia 05/19/2016  . History of adenomatous polyp of colon 03/21/2016  . Diabetes ([place]) 01/21/2016  . Thyroid nodule 12/25/2014  . Infiltrate noted on imaging study   . Cervical spine arthritis 11/14/2014  . Primary osteoarthritis of left hip 11/14/2014  . Loss of consciousness ([place]) 11/14/2014  . Chest pain 03/26/2014  . Musculoskeletal chest and rib pain 03/26/2014  . Rib pain on right side 02/28/2014  . Groin pain 04/18/2013  . UNSPECIFIED RENAL SCLEROSIS 01/16/2009  . GERD, SEVERE 05/25/2007  . Morbid obesity ([place]) 04/03/2007  . Dyslipidemia 08/31/2006  . Major depression, recurrent ([place]) 08/31/2006  . 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 Former smoker 08/31/2006  . HYPERTENSION, BENIGN SYSTEMIC 08/31/2006  . GASTRIC ULCER ACUTE WITHOUT HEMORRHAGE 08/31/2006  . CONVULSIONS, SEIZURES, NOS 08/31/2006    Carney Living PT DPT  02/10/2020, 2:25 PM  [facility] [address] [place], [place], [address] Phone: [phone]   Fax:  [phone]  Name: RYLEAH MIRAMONTES MRN: [id number] Date of Birth: Oct 29, 1955

## 2020-02-14 ENCOUNTER — Ambulatory Visit: Payer: Medicaid Other | Admitting: Physical Therapy

## 2020-02-14 ENCOUNTER — Encounter: Payer: Self-pay | Admitting: Physical Therapy

## 2020-02-14 ENCOUNTER — Other Ambulatory Visit: Payer: Self-pay

## 2020-02-14 DIAGNOSIS — M6281 Muscle weakness (generalized): Secondary | ICD-10-CM | POA: Diagnosis not present

## 2020-02-14 DIAGNOSIS — R262 Difficulty in walking, not elsewhere classified: Secondary | ICD-10-CM

## 2020-02-14 DIAGNOSIS — M25652 Stiffness of left hip, not elsewhere classified: Secondary | ICD-10-CM | POA: Diagnosis not present

## 2020-02-14 NOTE — Therapy (Signed)
[place], [place], [address] Phone: [phone]   Fax:  [phone]  Physical Therapy Treatment  Patient Details  Name: Kristin [name] MRN: [id number] Date of Birth: [birth year] Referring Provider (PT): Dr. Leandrew Koyanagi   Encounter Date: 02/14/2020   PT End of Session - 02/14/20 1058    Visit Number 4    Number of Visits 17    Date for PT Re-Evaluation 03/24/20    Authorization Type MCD    PT Start Time 1059    PT Stop Time 1142    PT Time Calculation (min) 43 min    Equipment Utilized During Treatment Gait belt    Activity Tolerance Patient tolerated treatment well    Behavior During Therapy [facility] for tasks assessed/performed           Past Medical History:  Diagnosis Date  . Allergy   . Anemia   . Anxiety   . Arthritis   . Back pain, chronic   . Borderline diabetes   . Bronchitis   . Constipation   . Cough   . Depression   . Epilepsy ([place])   . GERD (gastroesophageal reflux disease)   . Hyperlipidemia   . Hypertension   . IBS (irritable bowel syndrome)   . Joint pain   . Obesity   . Palpitations   . Pneumonia 2016  . Pre-diabetes   . 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 Rheumatoid arthritis ([place])   . Seizures ([place])    last >10 yrs ago- recorded 01/18/18  . Somatization disorder   . Tobacco abuse     Past Surgical History:  Procedure Laterality Date  . ABDOMINAL HYSTERECTOMY    . ANTERIOR HIP REVISION Left 07/25/2016   Procedure: LEFT HIP IRRIGATION AND DEBRIDEMENT, REVISION OF HEAD AND LINER;  Surgeon: Leandrew Koyanagi, MD;  Location: [place];  Service: Orthopedics;  Laterality: Left;  . APPLICATION OF WOUND VAC Left 12/23/2019   Procedure: APPLICATION OF WOUND VAC;  Surgeon: Leandrew Koyanagi, MD;  Location: [place];  Service: Orthopedics;  Laterality: Left;  . BREAST EXCISIONAL BIOPSY Right   . CHOLECYSTECTOMY    . COLONOSCOPY    . DEBRIDEMENT AND CLOSURE WOUND Left  07/17/2019   Procedure: Excision of left leg wound with ACell placement and primary closure;  Surgeon: Wallace Going, DO;  Location: [place];  Service: Plastics;  Laterality: Left;  60 min  . EXCISIONAL TOTAL HIP ARTHROPLASTY WITH ANTIBIOTIC SPACERS Left 12/23/2019   Procedure: LEFT HIP IRRIGATION AND DEBRIDEMENT LEFT HIP WOUND and poly exchange ;  Surgeon: Leandrew Koyanagi, MD;  Location: [place];  Service: Orthopedics;  Laterality: Left;  . HIP DEBRIDEMENT Left 01/19/2018   Procedure(s) Performed: IRRIGATION AND DEBRIDEMENT LEFT HIP (Left )  . INCISION AND DRAINAGE HIP Left 08/03/2016   Procedure: IRRIGATION AND DEBRIDEMENT LEFT HIP, VAC PLACEMENT;  Surgeon: Leandrew Koyanagi, MD;  Location: [place];  Service: Orthopedics;  Laterality: Left;  . INCISION AND DRAINAGE HIP Left 01/19/2018   Procedure: IRRIGATION AND DEBRIDEMENT LEFT HIP;  Surgeon: Leandrew Koyanagi, MD;  Location: [place];  Service: Orthopedics;  Laterality: Left;  . kidney stent Right 2002  . TONSILLECTOMY Bilateral 12/06/2014   Procedure: TONSILLECTOMY, BILATERAL;  Surgeon: Ruby Cola, MD;  Location: [place];  Service: ENT;  Laterality: Bilateral;  . TONSILLECTOMY    . TOTAL HIP ARTHROPLASTY Left 06/01/2016   Procedure:  LEFT TOTAL HIP ARTHROPLASTY ANTERIOR APPROACH;  Surgeon: Leandrew Koyanagi, MD;  Location: [place];  Service: Orthopedics;  Laterality: Left;  . TUBAL LIGATION    . WOUND EXPLORATION Left 01/19/2018   Procedure: WOUND EXPLORATION;  Surgeon: Leandrew Koyanagi, MD;  Location: [place];  Service: Orthopedics;  Laterality: Left;    There were no vitals filed for this visit.   Subjective Assessment - 02/14/20 1101    Subjective P reports she has no pain today, just stiffness/tightness. Pt states she has been wlaking a lot and her hip feels good.    Pertinent History depression, obesity, arthritis    Limitations Sitting;Standing;Walking    How long can you sit comfortably? stiff after sitting for a long time    How long can  you stand comfortably? < 10 min    How long can you walk comfortably? limited community distances    Diagnostic tests Xray in 2020; MRI    Patient Stated Goals get back to walking    Currently in Pain? No/denies    Pain Location Hip    Pain Orientation Left    Pain Type Post Delivery (post partum)    Pain Onset 1 to 4 weeks ago    Pain Frequency Intermittent    Aggravating Factors  standing, walking, sitting    Pain Relieving Factors rest    Effect of Pain on Daily Activities difficulty performing ADLs              [facility] PT Assessment - 02/14/20 0001      Assessment   Medical Diagnosis Left Hip I and D  (posterior percuations. ( 2017 anterior total hip)     Referring Provider (PT) Dr. Leandrew Koyanagi                         [facility] Adult PT Treatment/Exercise - 02/14/20 0001      Ambulation/Gait   Gait Comments walked with SPC 120 ft   CGA      Knee/Hip Exercises: Aerobic   Nustep L4x5 min UE/LE      Knee/Hip Exercises: Standing   Heel Raises 2 sets;10 reps    Knee Flexion 20 reps   in walker   Hip Abduction 2 sets;10 reps;Left;Both   at counter    Lateral Step Up 1 set;10 reps;Left;Right    Forward Step Up 1 set;10 reps;Left;Right      Knee/Hip Exercises: Seated   Long Arc Quad 2 sets;10 reps;Left;Right    Long Arc Quad Weight 3 lbs.    Sit to Sand 2 sets;10 reps      Knee/Hip Exercises: Supine   Bridges 2 sets;10 reps   mini bridges   Other Supine Knee/Hip Exercises Clam shell x20     Other Supine Knee/Hip Exercises Bent knee raise x 15                  PT Education - 02/14/20 1150    Education Details reviewed HEP, benefits of strengthening    Person(s) Educated Patient    Methods Explanation;Demonstration;Handout    Comprehension Verbalized understanding;Returned demonstration            PT Short Term Goals - 01/27/20 1744      PT SHORT TERM GOAL #1   Title Patient will increase L hip strength to 5/5 in all directions.     Baseline left hip flexion 4-/5 left hip abduction 4/5 left    Time 3  Period Weeks    Status New    Target Date 02/17/20      PT SHORT TERM GOAL #2   Title Patient will improve hip flexion PROM to 60 degrees to decrease stiffness in sitting.    Baseline 40 degrees    Time 3    Period Weeks    Status New    Target Date 02/17/20      PT SHORT TERM GOAL #3   Title Patient will transfer sit to stand without the use of her hands    Baseline uses hands to transfer sit to stand    Time 3    Period Weeks    Status New    Target Date 02/18/20      PT SHORT TERM GOAL #4   Title Patient will indepdnently state hip percautions    Baseline did not know hip percuations    Time 3    Period Weeks    Status New    Target Date 02/18/20             PT Long Term Goals - 01/27/20 1745      PT LONG TERM GOAL #1   Title Pt will be able to ambulate 1000' with LRAD to get back to walking, per patient goal.    Baseline using a walker. Only able to walk limited distances with the walker    Time 6    Period Weeks    Status New    Target Date 03/09/20      PT LONG TERM GOAL #2   Title Patient will be able to properly ascend and descend stairs with no reports of pain to be able to ambulate around her house and community.    Baseline needs hands and uses her right leg    Time 6    Period Weeks    Status New    Target Date 03/09/20      PT LONG TERM GOAL #3   Title Patient will demonstrate 90 degrees of hip flexion in order to sit on her soft couch comfortably    Baseline 40 degrees    Time 6    Period Weeks    Status New    Target Date 03/10/20                 Plan - 02/14/20 1145    Clinical Impression Statement Pt continues to make progress in therapy. She has maintained hip flexion ROM since last visit. Pt was able to ambulate with SPC with CGA 120 ft around the clinic and no loss of balance. Therapy continued to work on hip, glute, and quad strengthening and progressed  to standing exercises. Pt had minor difficulty with forward step ups on 4" step. Pt tolerated progressions well and had no pain at the end of treatment.    Personal Factors and Comorbidities Comorbidity 3+;Time since onset of injury/illness/exacerbation    Comorbidities history of recurrent infection, obesity, depression    Examination-Activity Limitations Stand;Squat;Sit    Stability/Clinical Decision Making Evolving/Moderate complexity    Clinical Decision Making Moderate    Rehab Potential Good    PT Frequency 2x / week    PT Duration 6 weeks    PT Treatment/Interventions ADLs/Self Care Home Management;Cryotherapy;Electrical Stimulation;Ultrasound;Traction;Moist Heat;Iontophoresis 4mg /ml Dexamethasone;Gait training;Stair training;Functional mobility training;Therapeutic activities;Therapeutic exercise;Balance training;Neuromuscular re-education;Manual techniques;Patient/family education;Passive range of motion;Dry needling;Joint Manipulations;Spinal Manipulations;Vasopneumatic Device;Taping    PT Next Visit Plan continue hip ROM stretching, hip and quad strengthening; gait training trial  of cane    PT Home Exercise Plan glute sets, quad sets, heel slides; marching, heel raises    Consulted and Agree with Plan of Care Patient           Patient will benefit from skilled therapeutic intervention in order to improve the following deficits and impairments:  Abnormal gait, Decreased range of motion, Difficulty walking, Decreased mobility, Decreased strength, Decreased activity tolerance, Decreased knowledge of precautions  Visit Diagnosis: Difficulty in walking, not elsewhere classified  Stiffness of left hip, not elsewhere classified     Problem List Patient Active Problem List   Diagnosis Date Noted  . Worsening headaches 01/21/2020  . Pruritus 10/30/2019  . Hypercalcemia 10/24/2019  . Body mass index 45.0-49.9, adult ([place]) 08/27/2019  . Intertrigo 07/09/2019  . Facial rash  07/27/2018  . Body mass index 40.0-44.9, adult ([place]) 02/15/2018  . Fatigue associated with anemia 02/07/2018  . Unspecified open wound, left hip, initial encounter 01/19/2018  . Wound of left leg, sequela 10/03/2016  . 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 Status post left hip replacement 06/01/2016  . Sinus tachycardia 05/19/2016  . History of adenomatous polyp of colon 03/21/2016  . Diabetes ([place]) 01/21/2016  . Thyroid nodule 12/25/2014  . Infiltrate noted on imaging study   . Cervical spine arthritis 11/14/2014  . Primary osteoarthritis of left hip 11/14/2014  . Loss of consciousness ([place]) 11/14/2014  . Chest pain 03/26/2014  . Musculoskeletal chest and rib pain 03/26/2014  . Rib pain on right side 02/28/2014  . Groin pain 04/18/2013  . UNSPECIFIED RENAL SCLEROSIS 01/16/2009  . GERD, SEVERE 05/25/2007  . Morbid obesity ([place]) 04/03/2007  . Dyslipidemia 08/31/2006  . Major depression, recurrent ([place]) 08/31/2006  . 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 Former smoker 08/31/2006  . HYPERTENSION, BENIGN SYSTEMIC 08/31/2006  . GASTRIC ULCER ACUTE WITHOUT HEMORRHAGE 08/31/2006  . CONVULSIONS, SEIZURES, NOS 08/31/2006    Carney Living PT DPT  02/14/2020, 12:37 PM   Minna Merritts SPT   During this treatment session, the therapist was present, participating in and directing the treatment.   [place], [place], [address] Phone: [phone]   Fax:  [phone]  Name: Kristin [name] MRN: [id number] Date of Birth: [birth year]

## 2020-02-18 ENCOUNTER — Other Ambulatory Visit: Payer: Self-pay

## 2020-02-18 ENCOUNTER — Ambulatory Visit: Payer: Medicaid Other

## 2020-02-18 DIAGNOSIS — M25652 Stiffness of left hip, not elsewhere classified: Secondary | ICD-10-CM | POA: Diagnosis not present

## 2020-02-18 DIAGNOSIS — M6281 Muscle weakness (generalized): Secondary | ICD-10-CM

## 2020-02-18 DIAGNOSIS — R262 Difficulty in walking, not elsewhere classified: Secondary | ICD-10-CM

## 2020-02-18 NOTE — Therapy (Addendum)
[place], [place], [address] Phone: [phone]   Fax:  [phone]  Physical Therapy Treatment  Patient Details  Name: Kristin [name] MRN: [id number] Date of Birth: 08/22/1955 Referring Provider (PT): Dr. Leandrew Koyanagi   Encounter Date: 02/18/2020   PT End of Session - 02/18/20 1159    Visit Number 5    Number of Visits 17    Date for PT Re-Evaluation 03/24/20    Authorization Type MCD    PT Start Time 1200    PT Stop Time 1242    PT Time Calculation (min) 42 min    Activity Tolerance Patient tolerated treatment well    Behavior During Therapy [facility] for tasks assessed/performed           Past Medical History:  Diagnosis Date  . Allergy   . Anemia   . Anxiety   . Arthritis   . Back pain, chronic   . Borderline diabetes   . Bronchitis   . Constipation   . Cough   . Depression   . Epilepsy ([place])   . GERD (gastroesophageal reflux disease)   . Hyperlipidemia   . Hypertension   . IBS (irritable bowel syndrome)   . Joint pain   . Obesity   . Palpitations   . Pneumonia 2016  . Pre-diabetes   . 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 Rheumatoid arthritis ([place])   . Seizures ([place])    last >10 yrs ago- recorded 01/18/18  . Somatization disorder   . Tobacco abuse     Past Surgical History:  Procedure Laterality Date  . ABDOMINAL HYSTERECTOMY    . ANTERIOR HIP REVISION Left 07/25/2016   Procedure: LEFT HIP IRRIGATION AND DEBRIDEMENT, REVISION OF HEAD AND LINER;  Surgeon: Leandrew Koyanagi, MD;  Location: [place];  Service: Orthopedics;  Laterality: Left;  . APPLICATION OF WOUND VAC Left 12/23/2019   Procedure: APPLICATION OF WOUND VAC;  Surgeon: Leandrew Koyanagi, MD;  Location: [place];  Service: Orthopedics;  Laterality: Left;  . BREAST EXCISIONAL BIOPSY Right   . CHOLECYSTECTOMY    . COLONOSCOPY    . DEBRIDEMENT AND CLOSURE WOUND Left 07/17/2019   Procedure: Excision of left leg wound  with ACell placement and primary closure;  Surgeon: Wallace Going, DO;  Location: [place];  Service: Plastics;  Laterality: Left;  60 min  . EXCISIONAL TOTAL HIP ARTHROPLASTY WITH ANTIBIOTIC SPACERS Left 12/23/2019   Procedure: LEFT HIP IRRIGATION AND DEBRIDEMENT LEFT HIP WOUND and poly exchange ;  Surgeon: Leandrew Koyanagi, MD;  Location: [place];  Service: Orthopedics;  Laterality: Left;  . HIP DEBRIDEMENT Left 01/19/2018   Procedure(s) Performed: IRRIGATION AND DEBRIDEMENT LEFT HIP (Left )  . INCISION AND DRAINAGE HIP Left 08/03/2016   Procedure: IRRIGATION AND DEBRIDEMENT LEFT HIP, VAC PLACEMENT;  Surgeon: Leandrew Koyanagi, MD;  Location: [place];  Service: Orthopedics;  Laterality: Left;  . INCISION AND DRAINAGE HIP Left 01/19/2018   Procedure: IRRIGATION AND DEBRIDEMENT LEFT HIP;  Surgeon: Leandrew Koyanagi, MD;  Location: [place];  Service: Orthopedics;  Laterality: Left;  . kidney stent Right 2002  . TONSILLECTOMY Bilateral 12/06/2014   Procedure: TONSILLECTOMY, BILATERAL;  Surgeon: Ruby Cola, MD;  Location: [place];  Service: ENT;  Laterality: Bilateral;  . TONSILLECTOMY    . TOTAL HIP ARTHROPLASTY Left 06/01/2016   Procedure: LEFT TOTAL HIP ARTHROPLASTY ANTERIOR APPROACH;  Surgeon: Georga Kaufmann  Ephriam Jenkins, MD;  Location: [place];  Service: Orthopedics;  Laterality: Left;  . TUBAL LIGATION    . WOUND EXPLORATION Left 01/19/2018   Procedure: WOUND EXPLORATION;  Surgeon: Leandrew Koyanagi, MD;  Location: [place];  Service: Orthopedics;  Laterality: Left;    There were no vitals filed for this visit.   Subjective Assessment - 02/18/20 1203    Subjective LT lateral hip pain. She reports she did not do HEP and was on her feet aloth this weekend    Pain Location Hip    Pain Orientation Left    Pain Descriptors / Indicators Aching    Pain Type Chronic pain    Pain Onset More than a month ago    Pain Frequency Intermittent    Aggravating Factors  weight bearing    Pain Relieving Factors rest                               [facility] Adult PT Treatment/Exercise - 02/18/20 0001      Knee/Hip Exercises: Aerobic   Nustep L 6x5 min UE/LE    Then at end of session  done again but legs only with 1 min x 3 with resistance of 7/10      Knee/Hip Exercises: Standing   Heel Raises 2 sets;10 reps    Knee Flexion 20 reps    Knee Flexion Limitations 2#    Hip Abduction Left;20 reps    Abduction Limitations 2#    Lateral Step Up Left;Step Height: 4";Hand Hold: 1;15 reps    Forward Step Up Left;Step Height: 4";15 reps;Hand Hold: 1      Knee/Hip Exercises: Seated   Long Arc Quad Left;20 reps    Long Arc Quad Weight 5 lbs.    Clamshell with [employer]   x20   Marching Right;Left;20 reps    Sit to [employer] 2 sets;10 reps                    PT Short Term Goals - 02/18/20 1245      PT SHORT TERM GOAL #1   Title Patient will increase L hip strength to 5/5 in all directions.    Baseline stil 4-5 on mmt flexion but lifting more weight    Status On-going      PT SHORT TERM GOAL #2   Title Patient will improve hip flexion PROM to 60 degrees to decrease stiffness in sitting.    Baseline 45 degrees    Status On-going      PT SHORT TERM GOAL #3   Title Patient will transfer sit to stand without the use of her hands    Baseline ABLE WITHOUT HANDS    Status Achieved      PT SHORT TERM GOAL #4   Title Patient will independently state hip percautions    Baseline sHE UNDERSTOOD  TO NOT CROSS LEGS AND BEND OVER PAST 90 DEGREES,    Status Unable to assess             PT Long Term Goals - 01/27/20 1745      PT LONG TERM GOAL #1   Title Pt will be able to ambulate 1000' with LRAD to get back to walking, per patient goal.    Baseline using a walker. Only able to walk limited distances with the walker    Time 6    Period Weeks    Status New  Target Date 03/09/20      PT LONG TERM GOAL #2   Title Patient will be able to properly ascend and descend stairs with no  reports of pain to be able to ambulate around her house and community.    Baseline needs hands and uses her right leg    Time 6    Period Weeks    Status New    Target Date 03/09/20      PT LONG TERM GOAL #3   Title Patient will demonstrate 90 degrees of hip flexion in order to sit on her soft couch comfortably    Baseline 40 degrees    Time 6    Period Weeks    Status New    Target Date 03/10/20                 Plan - 02/18/20 1202    Clinical Impression Statement She reported fatigue and LT hip feeling better so suggested exercise should help with pain. Increased some of the resistance so she may be sore tomorrow.  appears to be progressing but still needs cane for stability and skilled PT for strengthening and balance. Marland Kitchen    PT Frequency 2x / week    PT Duration 6 weeks    PT Treatment/Interventions ADLs/Self Care Home Management;Cryotherapy;Electrical Stimulation;Ultrasound;Traction;Moist Heat;Iontophoresis 4mg /ml Dexamethasone;Gait training;Stair training;Functional mobility training;Therapeutic activities;Therapeutic exercise;Balance training;Neuromuscular re-education;Manual techniques;Patient/family education;Passive range of motion;Dry needling;Joint Manipulations;Spinal Manipulations;Vasopneumatic Device;Taping    PT Next Visit Plan continue hip ROM stretching, hip and quad strengthening; gait training with  cane    PT Home Exercise Plan glute sets, quad sets, heel slides; marching, heel raises    Consulted and Agree with Plan of Care Patient           Patient will benefit from skilled therapeutic intervention in order to improve the following deficits and impairments:  Abnormal gait, Decreased range of motion, Difficulty walking, Decreased mobility, Decreased strength, Decreased activity tolerance, Decreased knowledge of precautions  Visit Diagnosis: Stiffness of left hip, not elsewhere classified  Difficulty in walking, not elsewhere classified  Muscle weakness  (generalized)     Problem List Patient Active Problem List   Diagnosis Date Noted  . Worsening headaches 01/21/2020  . Pruritus 10/30/2019  . Hypercalcemia 10/24/2019  . Body mass index 45.0-49.9, adult ([place]) 08/27/2019  . Intertrigo 07/09/2019  . Facial rash 07/27/2018  . Body mass index 40.0-44.9, adult ([place]) 02/15/2018  . Fatigue associated with anemia 02/07/2018  . Unspecified open wound, left hip, initial encounter 01/19/2018  . Wound of left leg, sequela 10/03/2016  . 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 Status post left hip replacement 06/01/2016  . Sinus tachycardia 05/19/2016  . History of adenomatous polyp of colon 03/21/2016  . Diabetes ([place]) 01/21/2016  . Thyroid nodule 12/25/2014  . Infiltrate noted on imaging study   . Cervical spine arthritis 11/14/2014  . Primary osteoarthritis of left hip 11/14/2014  . Loss of consciousness ([place]) 11/14/2014  . Chest pain 03/26/2014  . Musculoskeletal chest and rib pain 03/26/2014  . Rib pain on right side 02/28/2014  . Groin pain 04/18/2013  . UNSPECIFIED RENAL SCLEROSIS 01/16/2009  . GERD, SEVERE 05/25/2007  . Morbid obesity ([place]) 04/03/2007  . Dyslipidemia 08/31/2006  . Major depression, recurrent ([place]) 08/31/2006  . 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 Former smoker 08/31/2006  . HYPERTENSION, BENIGN SYSTEMIC 08/31/2006  . GASTRIC ULCER ACUTE WITHOUT HEMORRHAGE 08/31/2006  . CONVULSIONS, SEIZURES, NOS 08/31/2006  Darrel Hoover   PT 02/18/2020, 2:00 PM  [facility] [address] [place], [place], [address] Phone: [phone]   Fax:  [phone]  Name: Kristin [name] MRN: [id number] Date of Birth: Oct 18, 1955

## 2020-02-24 ENCOUNTER — Other Ambulatory Visit: Payer: Self-pay

## 2020-02-24 ENCOUNTER — Ambulatory Visit: Payer: Medicaid Other

## 2020-02-24 DIAGNOSIS — M25652 Stiffness of left hip, not elsewhere classified: Secondary | ICD-10-CM

## 2020-02-24 DIAGNOSIS — M6281 Muscle weakness (generalized): Secondary | ICD-10-CM | POA: Diagnosis not present

## 2020-02-24 DIAGNOSIS — R262 Difficulty in walking, not elsewhere classified: Secondary | ICD-10-CM | POA: Diagnosis not present

## 2020-02-24 NOTE — Patient Instructions (Signed)
Stand lateal toe touches with hip abduction x 3-10 reps RT/LT  daily

## 2020-02-24 NOTE — Therapy (Signed)
[place], [place], [address] Phone: [phone]   Fax:  [phone]  Physical Therapy Treatment  Patient Details  Name: Kristin [name] MRN: [id number] Date of Birth: 11-01-1955 Referring Provider (PT): Dr. Leandrew Koyanagi   Encounter Date: 02/24/2020   PT End of Session - 02/24/20 1123    Visit Number 6    Number of Visits 17    Date for PT Re-Evaluation 03/24/20    Authorization Type MCD    PT Start Time 1130    PT Stop Time 1212    PT Time Calculation (min) 42 min    Activity Tolerance Patient tolerated treatment well    Behavior During Therapy [facility] for tasks assessed/performed           Past Medical History:  Diagnosis Date  . Allergy   . Anemia   . Anxiety   . Arthritis   . Back pain, chronic   . Borderline diabetes   . Bronchitis   . Constipation   . Cough   . Depression   . Epilepsy ([place])   . GERD (gastroesophageal reflux disease)   . Hyperlipidemia   . Hypertension   . IBS (irritable bowel syndrome)   . Joint pain   . Obesity   . Palpitations   . Pneumonia 2016  . Pre-diabetes   . 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 Rheumatoid arthritis ([place])   . Seizures ([place])    last >10 yrs ago- recorded 01/18/18  . Somatization disorder   . Tobacco abuse     Past Surgical History:  Procedure Laterality Date  . ABDOMINAL HYSTERECTOMY    . ANTERIOR HIP REVISION Left 07/25/2016   Procedure: LEFT HIP IRRIGATION AND DEBRIDEMENT, REVISION OF HEAD AND LINER;  Surgeon: Leandrew Koyanagi, MD;  Location: [place];  Service: Orthopedics;  Laterality: Left;  . APPLICATION OF WOUND VAC Left 12/23/2019   Procedure: APPLICATION OF WOUND VAC;  Surgeon: Leandrew Koyanagi, MD;  Location: [place];  Service: Orthopedics;  Laterality: Left;  . BREAST EXCISIONAL BIOPSY Right   . CHOLECYSTECTOMY    . COLONOSCOPY    . DEBRIDEMENT AND CLOSURE WOUND Left 07/17/2019   Procedure: Excision of left leg wound  with ACell placement and primary closure;  Surgeon: Wallace Going, DO;  Location: [place];  Service: Plastics;  Laterality: Left;  60 min  . EXCISIONAL TOTAL HIP ARTHROPLASTY WITH ANTIBIOTIC SPACERS Left 12/23/2019   Procedure: LEFT HIP IRRIGATION AND DEBRIDEMENT LEFT HIP WOUND and poly exchange ;  Surgeon: Leandrew Koyanagi, MD;  Location: [place];  Service: Orthopedics;  Laterality: Left;  . HIP DEBRIDEMENT Left 01/19/2018   Procedure(s) Performed: IRRIGATION AND DEBRIDEMENT LEFT HIP (Left )  . INCISION AND DRAINAGE HIP Left 08/03/2016   Procedure: IRRIGATION AND DEBRIDEMENT LEFT HIP, VAC PLACEMENT;  Surgeon: Leandrew Koyanagi, MD;  Location: [place];  Service: Orthopedics;  Laterality: Left;  . INCISION AND DRAINAGE HIP Left 01/19/2018   Procedure: IRRIGATION AND DEBRIDEMENT LEFT HIP;  Surgeon: Leandrew Koyanagi, MD;  Location: [place];  Service: Orthopedics;  Laterality: Left;  . kidney stent Right 2002  . TONSILLECTOMY Bilateral 12/06/2014   Procedure: TONSILLECTOMY, BILATERAL;  Surgeon: Ruby Cola, MD;  Location: [place];  Service: ENT;  Laterality: Bilateral;  . TONSILLECTOMY    . TOTAL HIP ARTHROPLASTY Left 06/01/2016   Procedure: LEFT TOTAL HIP ARTHROPLASTY ANTERIOR APPROACH;  Surgeon: Georga Kaufmann  Ephriam Jenkins, MD;  Location: [place];  Service: Orthopedics;  Laterality: Left;  . TUBAL LIGATION    . WOUND EXPLORATION Left 01/19/2018   Procedure: WOUND EXPLORATION;  Surgeon: Leandrew Koyanagi, MD;  Location: [place];  Service: Orthopedics;  Laterality: Left;    There were no vitals filed for this visit.   Subjective Assessment - 02/24/20 1143    Subjective No pain today.    Currently in Pain? No/denies                             [facility] Adult PT Treatment/Exercise - 02/24/20 0001      Knee/Hip Exercises: Aerobic   Nustep L6   6 min LE/UE   Then at end of session L      Knee/Hip Exercises: Standing   Heel Raises 2 sets;10 reps    Knee Flexion 20 reps    Knee Flexion  Limitations 3#    Hip Flexion Right;Left;15 reps    Hip Flexion Limitations 3#    Hip Abduction Left;Right;10 reps    Abduction Limitations 3#    Lateral Step Up Left;Step Height: 4";Hand Hold: 1;15 reps    Forward Step Up Left;Step Height: 4";15 reps;Hand Hold: 1;Right    Other Standing Knee Exercises isometric holds  leg in abduction with toe lifts x 3-5 reps RTR and LT       Knee/Hip Exercises: Seated   Long Arc Quad Left;20 reps    Long Arc Quad Weight 5 lbs.    Clamshell with TheraBand --    Sit to Sand 2 sets;10 reps      Knee/Hip Exercises: Supine   Bridges Both;10 reps    Bridges with [employer] Both;20 reps    Bridges with Clamshell Both;20 reps   red band   Other Supine Knee/Hip Exercises Bent knee raise x 20                  PT Education - 02/24/20 1214    Education Details HEP , cationed to stop if hip soreness occurs    Person(s) Educated Patient    Methods Explanation;Demonstration;Verbal cues    Comprehension Verbalized understanding;Returned demonstration            PT Short Term Goals - 02/18/20 1245      PT SHORT TERM GOAL #1   Title Patient will increase L hip strength to 5/5 in all directions.    Baseline stil 4-5 on mmt flexion but lifting more weight    Status On-going      PT SHORT TERM GOAL #2   Title Patient will improve hip flexion PROM to 60 degrees to decrease stiffness in sitting.    Baseline 45 degrees    Status On-going      PT SHORT TERM GOAL #3   Title Patient will transfer sit to stand without the use of her hands    Baseline ABLE WITHOUT HANDS    Status Achieved      PT SHORT TERM GOAL #4   Title Patient will independently state hip percautions    Baseline sHE UNDERSTOOD  TO NOT CROSS LEGS AND BEND OVER PAST 90 DEGREES,    Status Unable to assess             PT Long Term Goals - 01/27/20 1745      PT LONG TERM GOAL #1   Title Pt will be able to ambulate 1000' with  LRAD to get back to walking, per patient goal.     Baseline using a walker. Only able to walk limited distances with the walker    Time 6    Period Weeks    Status New    Target Date 03/09/20      PT LONG TERM GOAL #2   Title Patient will be able to properly ascend and descend stairs with no reports of pain to be able to ambulate around her house and community.    Baseline needs hands and uses her right leg    Time 6    Period Weeks    Status New    Target Date 03/09/20      PT LONG TERM GOAL #3   Title Patient will demonstrate 90 degrees of hip flexion in order to sit on her soft couch comfortably    Baseline 40 degrees    Time 6    Period Weeks    Status New    Target Date 03/10/20                 Plan - 02/24/20 1211    Clinical Impression Statement She reported fatigue but no pain. Weakness still significant  though she works hard with all exercises.   RT hip also has some stability issues with weakness so also do some o the exercises on RT.   Continue as tolerated with exercises    PT Treatment/Interventions ADLs/Self Care Home Management;Cryotherapy;Electrical Stimulation;Ultrasound;Traction;Moist Heat;Iontophoresis 4mg /ml Dexamethasone;Gait training;Stair training;Functional mobility training;Therapeutic activities;Therapeutic exercise;Balance training;Neuromuscular re-education;Manual techniques;Patient/family education;Passive range of motion;Dry needling;Joint Manipulations;Spinal Manipulations;Vasopneumatic Device;Taping    PT Home Exercise Plan glute sets, quad sets, heel slides; marching, heel raises, stand lateral hip isometric holds    Consulted and Agree with Plan of Care Patient           Patient will benefit from skilled therapeutic intervention in order to improve the following deficits and impairments:  Abnormal gait, Decreased range of motion, Difficulty walking, Decreased mobility, Decreased strength, Decreased activity tolerance, Decreased knowledge of precautions  Visit Diagnosis: Stiffness of  left hip, not elsewhere classified  Difficulty in walking, not elsewhere classified  Muscle weakness (generalized)     Problem List Patient Active Problem List   Diagnosis Date Noted  . Worsening headaches 01/21/2020  . Pruritus 10/30/2019  . Hypercalcemia 10/24/2019  . Body mass index 45.0-49.9, adult ([place]) 08/27/2019  . Intertrigo 07/09/2019  . Facial rash 07/27/2018  . Body mass index 40.0-44.9, adult ([place]) 02/15/2018  . Fatigue associated with anemia 02/07/2018  . Unspecified open wound, left hip, initial encounter 01/19/2018  . Wound of left leg, sequela 10/03/2016  . 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 Status post left hip replacement 06/01/2016  . Sinus tachycardia 05/19/2016  . History of adenomatous polyp of colon 03/21/2016  . Diabetes ([place]) 01/21/2016  . Thyroid nodule 12/25/2014  . Infiltrate noted on imaging study   . Cervical spine arthritis 11/14/2014  . Primary osteoarthritis of left hip 11/14/2014  . Loss of consciousness ([place]) 11/14/2014  . Chest pain 03/26/2014  . Musculoskeletal chest and rib pain 03/26/2014  . Rib pain on right side 02/28/2014  . Groin pain 04/18/2013  . UNSPECIFIED RENAL SCLEROSIS 01/16/2009  . GERD, SEVERE 05/25/2007  . Morbid obesity ([place]) 04/03/2007  . Dyslipidemia 08/31/2006  . Major depression, recurrent ([place]) 08/31/2006  . 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 Former smoker 08/31/2006  . HYPERTENSION, BENIGN SYSTEMIC 08/31/2006  . GASTRIC ULCER ACUTE  WITHOUT HEMORRHAGE 08/31/2006  . CONVULSIONS, SEIZURES, NOS 08/31/2006    Darrel Hoover  PT 02/24/2020, 12:15 PM  [facility] [address] [place], [place], [address] Phone: [phone]   Fax:  [phone]  Name: Kristin [name] MRN: [id number] Date of Birth: [birth year]

## 2020-02-26 ENCOUNTER — Encounter: Payer: Self-pay | Admitting: Physical Therapy

## 2020-02-26 ENCOUNTER — Other Ambulatory Visit: Payer: Self-pay

## 2020-02-26 ENCOUNTER — Ambulatory Visit: Payer: Medicaid Other | Admitting: Physical Therapy

## 2020-02-26 DIAGNOSIS — M25652 Stiffness of left hip, not elsewhere classified: Secondary | ICD-10-CM

## 2020-02-26 DIAGNOSIS — R262 Difficulty in walking, not elsewhere classified: Secondary | ICD-10-CM | POA: Diagnosis not present

## 2020-02-26 DIAGNOSIS — M6281 Muscle weakness (generalized): Secondary | ICD-10-CM | POA: Diagnosis not present

## 2020-02-27 ENCOUNTER — Other Ambulatory Visit: Payer: Self-pay

## 2020-02-27 MED ORDER — METFORMIN HCL 500 MG PO TABS: 500.0000 mg | ORAL_TABLET | Freq: Two times a day (BID) | ORAL | 1 refills | Status: DC

## 2020-02-27 NOTE — Therapy (Signed)
[place], [place], [address] Phone: [phone]   Fax:  [phone]  Physical Therapy Treatment  Patient Details  Name: DALANI METTE MRN: [id number] Date of Birth: 09-Nov-1955 Referring Provider (PT): Dr. Leandrew Koyanagi   Encounter Date: 02/26/2020   PT End of Session - 02/27/20 1158    Visit Number 7    Number of Visits 17    Date for PT Re-Evaluation 03/24/20    Authorization Type MCD    PT Start Time 9509    PT Stop Time 1226    PT Time Calculation (min) 41 min    Activity Tolerance Patient tolerated treatment well    Behavior During Therapy [facility] for tasks assessed/performed           Past Medical History:  Diagnosis Date   Allergy    Anemia    Anxiety    Arthritis    Back pain, chronic    Borderline diabetes    Bronchitis    Constipation    Cough    Depression    Epilepsy ([place])    GERD (gastroesophageal reflux disease)    Hyperlipidemia    Hypertension    IBS (irritable bowel syndrome)    Joint pain    Obesity    Palpitations    Pneumonia 2016   Pre-diabetes    Renal sclerosis, unspecified    only has 1 kidney   Rheumatoid arthritis ([place])    Seizures ([place])    last >10 yrs ago- recorded 01/18/18   Somatization disorder    Tobacco abuse     Past Surgical History:  Procedure Laterality Date   ABDOMINAL HYSTERECTOMY     ANTERIOR HIP REVISION Left 07/25/2016   Procedure: LEFT HIP IRRIGATION AND DEBRIDEMENT, REVISION OF HEAD AND LINER;  Surgeon: Leandrew Koyanagi, MD;  Location: [place];  Service: Orthopedics;  Laterality: Left;   APPLICATION OF WOUND VAC Left 12/23/2019   Procedure: APPLICATION OF WOUND VAC;  Surgeon: Leandrew Koyanagi, MD;  Location: [place];  Service: Orthopedics;  Laterality: Left;   BREAST EXCISIONAL BIOPSY Right    CHOLECYSTECTOMY     COLONOSCOPY     DEBRIDEMENT AND CLOSURE WOUND Left 07/17/2019   Procedure: Excision of left leg wound  with ACell placement and primary closure;  Surgeon: Wallace Going, DO;  Location: [place];  Service: Plastics;  Laterality: Left;  60 min   EXCISIONAL TOTAL HIP ARTHROPLASTY WITH ANTIBIOTIC SPACERS Left 12/23/2019   Procedure: LEFT HIP IRRIGATION AND DEBRIDEMENT LEFT HIP WOUND and poly exchange ;  Surgeon: Leandrew Koyanagi, MD;  Location: [place];  Service: Orthopedics;  Laterality: Left;   HIP DEBRIDEMENT Left 01/19/2018   Procedure(s) Performed: IRRIGATION AND DEBRIDEMENT LEFT HIP (Left )   INCISION AND DRAINAGE HIP Left 08/03/2016   Procedure: IRRIGATION AND DEBRIDEMENT LEFT HIP, VAC PLACEMENT;  Surgeon: Leandrew Koyanagi, MD;  Location: [place];  Service: Orthopedics;  Laterality: Left;   INCISION AND DRAINAGE HIP Left 01/19/2018   Procedure: IRRIGATION AND DEBRIDEMENT LEFT HIP;  Surgeon: Leandrew Koyanagi, MD;  Location: [place];  Service: Orthopedics;  Laterality: Left;   kidney stent Right 2002   TONSILLECTOMY Bilateral 12/06/2014   Procedure: TONSILLECTOMY, BILATERAL;  Surgeon: Ruby Cola, MD;  Location: [place];  Service: ENT;  Laterality: Bilateral;   TONSILLECTOMY     TOTAL HIP ARTHROPLASTY Left 06/01/2016   Procedure: LEFT TOTAL HIP ARTHROPLASTY ANTERIOR APPROACH;  Surgeon: Georga Kaufmann  Ephriam Jenkins, MD;  Location: [place];  Service: Orthopedics;  Laterality: Left;   TUBAL LIGATION     WOUND EXPLORATION Left 01/19/2018   Procedure: WOUND EXPLORATION;  Surgeon: Leandrew Koyanagi, MD;  Location: [place];  Service: Orthopedics;  Laterality: Left;    There were no vitals filed for this visit.   Subjective Assessment - 02/26/20 1245    Subjective Patient continues to do well. She had minor soreness after the last visit but nothing significant. She has been using the cane without difficulty.    Pertinent History depression, obesity, arthritis    Limitations Sitting;Standing;Walking    How long can you sit comfortably? stiff after sitting for a long time    How long can you stand comfortably?  < 10 min    How long can you walk comfortably? limited community distances    Diagnostic tests Xray in 2020; MRI    Patient Stated Goals get back to walking    Currently in Pain? No/denies                             [facility] Adult PT Treatment/Exercise - 02/27/20 0001      Ambulation/Gait   Gait Comments Patient walked in the clinic without a device. She had laterl movement with gait but good stability       Knee/Hip Exercises: Aerobic   Nustep L6   6 min LE/UE   Then at end of session L      Knee/Hip Exercises: Standing   Heel Raises 2 sets;10 reps    Knee Flexion 20 reps    Knee Flexion Limitations 3#     Hip Flexion Right;Left;15 reps    Hip Flexion Limitations 3#    Hip Abduction Left;Right;10 reps    Abduction Limitations 3#     Lateral Step Up Left;Step Height: 4";Hand Hold: 1;15 reps    Forward Step Up Left;Step Height: 4";15 reps;Hand Hold: 1;Right    Other Standing Knee Exercises sit to stand transfer 2x10       Knee/Hip Exercises: Seated   Long Arc Quad Limitations x20 bilateral red band     Heel Slides Limitations hamstring curl x15 bilateral red     Sit to [employer] 2 sets;10 reps      Knee/Hip Exercises: Supine   Bridges Both;10 reps    Other Supine Knee/Hip Exercises clamshell red 2x10     Other Supine Knee/Hip Exercises Bent knee raise x 20      Manual Therapy   Passive ROM assessed ROM; ROM WNL                   PT Education - 02/27/20 1157    Education Details reviewed symptom mangement with ther-ex.    Person(s) Educated Patient    Methods Explanation;Demonstration;Tactile cues;Verbal cues    Comprehension Verbalized understanding;Returned demonstration;Verbal cues required;Tactile cues required            PT Short Term Goals - 02/18/20 1245      PT SHORT TERM GOAL #1   Title Patient will increase L hip strength to 5/5 in all directions.    Baseline stil 4-5 on mmt flexion but lifting more weight    Status On-going       PT SHORT TERM GOAL #2   Title Patient will improve hip flexion PROM to 60 degrees to decrease stiffness in sitting.    Baseline 45 degrees  Status On-going      PT SHORT TERM GOAL #3   Title Patient will transfer sit to stand without the use of her hands    Baseline ABLE WITHOUT HANDS    Status Achieved      PT SHORT TERM GOAL #4   Title Patient will independently state hip percautions    Baseline sHE UNDERSTOOD  TO NOT CROSS LEGS AND BEND OVER PAST 90 DEGREES,    Status Unable to assess             PT Long Term Goals - 01/27/20 1745      PT LONG TERM GOAL #1   Title Pt will be able to ambulate 1000' with LRAD to get back to walking, per patient goal.    Baseline using a walker. Only able to walk limited distances with the walker    Time 6    Period Weeks    Status New    Target Date 03/09/20      PT LONG TERM GOAL #2   Title Patient will be able to properly ascend and descend stairs with no reports of pain to be able to ambulate around her house and community.    Baseline needs hands and uses her right leg    Time 6    Period Weeks    Status New    Target Date 03/09/20      PT LONG TERM GOAL #3   Title Patient will demonstrate 90 degrees of hip flexion in order to sit on her soft couch comfortably    Baseline 40 degrees    Time 6    Period Weeks    Status New    Target Date 03/10/20                 Plan - 02/27/20 1200    Clinical Impression Statement Patient is making great progreess. She walked in the clinic without a device. She does have an antalgic gait but good stability. She was fatigued with treatment but had no significant pain. Therapy will continue to progress as tolerated.    Personal Factors and Comorbidities Comorbidity 3+;Time since onset of injury/illness/exacerbation    Comorbidities history of recurrent infection, obesity, depression    Examination-Activity Limitations Stand;Squat;Sit    Stability/Clinical Decision Making  Evolving/Moderate complexity    Clinical Decision Making Moderate    Rehab Potential Good    PT Frequency 2x / week    PT Duration 6 weeks    PT Treatment/Interventions ADLs/Self Care Home Management;Cryotherapy;Electrical Stimulation;Ultrasound;Traction;Moist Heat;Iontophoresis 4mg /ml Dexamethasone;Gait training;Stair training;Functional mobility training;Therapeutic activities;Therapeutic exercise;Balance training;Neuromuscular re-education;Manual techniques;Patient/family education;Passive range of motion;Dry needling;Joint Manipulations;Spinal Manipulations;Vasopneumatic Device;Taping    PT Next Visit Plan continue hip ROM stretching, hip and quad strengthening; gait training with  cane    PT Home Exercise Plan glute sets, quad sets, heel slides; marching, heel raises, stand lateral hip isometric holds    Consulted and Agree with Plan of Care Patient           Patient will benefit from skilled therapeutic intervention in order to improve the following deficits and impairments:  Abnormal gait, Decreased range of motion, Difficulty walking, Decreased mobility, Decreased strength, Decreased activity tolerance, Decreased knowledge of precautions  Visit Diagnosis: Stiffness of left hip, not elsewhere classified  Difficulty in walking, not elsewhere classified  Muscle weakness (generalized)     Problem List Patient Active Problem List   Diagnosis Date Noted   Worsening headaches 01/21/2020   Pruritus 10/30/2019  Hypercalcemia 10/24/2019   Body mass index 45.0-49.9, adult ([place]) 08/27/2019   Intertrigo 07/09/2019   Facial rash 07/27/2018   Body mass index 40.0-44.9, adult ([place]) 02/15/2018   Fatigue associated with anemia 02/07/2018   Unspecified open wound, left hip, initial encounter 01/19/2018   Wound of left leg, sequela 10/03/2016   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   Status post left hip replacement 06/01/2016   Sinus tachycardia  05/19/2016   History of adenomatous polyp of colon 03/21/2016   Diabetes ([place]) 01/21/2016   Thyroid nodule 12/25/2014   Infiltrate noted on imaging study    Cervical spine arthritis 11/14/2014   Primary osteoarthritis of left hip 11/14/2014   Loss of consciousness ([place]) 11/14/2014   Chest pain 03/26/2014   Musculoskeletal chest and rib pain 03/26/2014   Rib pain on right side 02/28/2014   Groin pain 04/18/2013   UNSPECIFIED RENAL SCLEROSIS 01/16/2009   GERD, SEVERE 05/25/2007   Morbid obesity ([place]) 04/03/2007   Dyslipidemia 08/31/2006   Major depression, recurrent ([place]) 08/31/2006   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   Former smoker 08/31/2006   HYPERTENSION, BENIGN SYSTEMIC 08/31/2006   GASTRIC ULCER ACUTE WITHOUT HEMORRHAGE 08/31/2006   CONVULSIONS, SEIZURES, NOS 08/31/2006    Carney Living PT DPT  02/27/2020, 12:02 PM  [facility] [address] [place], [place], [address] Phone: [phone]   Fax:  [phone]  Name: SKYLAR FLYNT MRN: [id number] Date of Birth: 23-Nov-1955

## 2020-03-02 ENCOUNTER — Ambulatory Visit: Payer: Medicaid Other | Admitting: Physical Therapy

## 2020-03-02 ENCOUNTER — Other Ambulatory Visit: Payer: Self-pay

## 2020-03-02 ENCOUNTER — Encounter: Payer: Self-pay | Admitting: Physical Therapy

## 2020-03-02 DIAGNOSIS — M6281 Muscle weakness (generalized): Secondary | ICD-10-CM | POA: Diagnosis not present

## 2020-03-02 DIAGNOSIS — M25652 Stiffness of left hip, not elsewhere classified: Secondary | ICD-10-CM | POA: Diagnosis not present

## 2020-03-02 DIAGNOSIS — R262 Difficulty in walking, not elsewhere classified: Secondary | ICD-10-CM | POA: Diagnosis not present

## 2020-03-02 NOTE — Therapy (Signed)
[place], [place], [address] Phone: [phone]   Fax:  [phone]  Physical Therapy Treatment  Patient Details  Name: Kristin [name] MRN: [id number] Date of Birth: 04-Jan-1956 Referring Provider (PT): Dr. Leandrew Koyanagi   Encounter Date: 03/02/2020   PT End of Session - 03/02/20 1158    Visit Number 8    Number of Visits 17    Date for PT Re-Evaluation 03/24/20    Authorization Type MCD    PT Start Time 1145    PT Stop Time 1227    PT Time Calculation (min) 42 min    Equipment Utilized During Treatment Gait belt    Activity Tolerance Patient tolerated treatment well    Behavior During Therapy [facility] for tasks assessed/performed           Past Medical History:  Diagnosis Date  . Allergy   . Anemia   . Anxiety   . Arthritis   . Back pain, chronic   . Borderline diabetes   . Bronchitis   . Constipation   . Cough   . Depression   . Epilepsy ([place])   . GERD (gastroesophageal reflux disease)   . Hyperlipidemia   . Hypertension   . IBS (irritable bowel syndrome)   . Joint pain   . Obesity   . Palpitations   . Pneumonia 2016  . Pre-diabetes   . 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 Rheumatoid arthritis ([place])   . Seizures ([place])    last >10 yrs ago- recorded 01/18/18  . Somatization disorder   . Tobacco abuse     Past Surgical History:  Procedure Laterality Date  . ABDOMINAL HYSTERECTOMY    . ANTERIOR HIP REVISION Left 07/25/2016   Procedure: LEFT HIP IRRIGATION AND DEBRIDEMENT, REVISION OF HEAD AND LINER;  Surgeon: Leandrew Koyanagi, MD;  Location: [place];  Service: Orthopedics;  Laterality: Left;  . APPLICATION OF WOUND VAC Left 12/23/2019   Procedure: APPLICATION OF WOUND VAC;  Surgeon: Leandrew Koyanagi, MD;  Location: [place];  Service: Orthopedics;  Laterality: Left;  . BREAST EXCISIONAL BIOPSY Right   . CHOLECYSTECTOMY    . COLONOSCOPY    . DEBRIDEMENT AND CLOSURE WOUND Left  07/17/2019   Procedure: Excision of left leg wound with ACell placement and primary closure;  Surgeon: Wallace Going, DO;  Location: [place];  Service: Plastics;  Laterality: Left;  60 min  . EXCISIONAL TOTAL HIP ARTHROPLASTY WITH ANTIBIOTIC SPACERS Left 12/23/2019   Procedure: LEFT HIP IRRIGATION AND DEBRIDEMENT LEFT HIP WOUND and poly exchange ;  Surgeon: Leandrew Koyanagi, MD;  Location: [place];  Service: Orthopedics;  Laterality: Left;  . HIP DEBRIDEMENT Left 01/19/2018   Procedure(s) Performed: IRRIGATION AND DEBRIDEMENT LEFT HIP (Left )  . INCISION AND DRAINAGE HIP Left 08/03/2016   Procedure: IRRIGATION AND DEBRIDEMENT LEFT HIP, VAC PLACEMENT;  Surgeon: Leandrew Koyanagi, MD;  Location: [place];  Service: Orthopedics;  Laterality: Left;  . INCISION AND DRAINAGE HIP Left 01/19/2018   Procedure: IRRIGATION AND DEBRIDEMENT LEFT HIP;  Surgeon: Leandrew Koyanagi, MD;  Location: [place];  Service: Orthopedics;  Laterality: Left;  . kidney stent Right 2002  . TONSILLECTOMY Bilateral 12/06/2014   Procedure: TONSILLECTOMY, BILATERAL;  Surgeon: Ruby Cola, MD;  Location: [place];  Service: ENT;  Laterality: Bilateral;  . TONSILLECTOMY    . TOTAL HIP ARTHROPLASTY Left 06/01/2016   Procedure:  LEFT TOTAL HIP ARTHROPLASTY ANTERIOR APPROACH;  Surgeon: Leandrew Koyanagi, MD;  Location: [place];  Service: Orthopedics;  Laterality: Left;  . TUBAL LIGATION    . WOUND EXPLORATION Left 01/19/2018   Procedure: WOUND EXPLORATION;  Surgeon: Leandrew Koyanagi, MD;  Location: [place];  Service: Orthopedics;  Laterality: Left;    There were no vitals filed for this visit.   Subjective Assessment - 03/02/20 1150    Subjective Patient had a little soreness over the weekend. Overall she feels like she is doing well.    Pertinent History depression, obesity, arthritis    Limitations Sitting;Standing;Walking    How long can you sit comfortably? stiff after sitting for a long time    How long can you stand comfortably? < 10  min    How long can you walk comfortably? limited community distances    Diagnostic tests Xray in 2020; MRI    Patient Stated Goals get back to walking    Currently in Pain? Yes    Pain Score 3     Pain Location Hip    Pain Orientation Left    Pain Descriptors / Indicators Aching    Pain Type Chronic pain    Pain Onset More than a month ago    Aggravating Factors  weight bearing    Pain Relieving Factors rest    Effect of Pain on Daily Activities difficulty perfroming ADL's                             [facility] Adult PT Treatment/Exercise - 03/02/20 0001      Knee/Hip Exercises: Standing   Heel Raises 2 sets;10 reps    Knee Flexion 20 reps    Knee Flexion Limitations 3#     Hip Abduction Left;Right;10 reps    Lateral Step Up Left;Step Height: 4";Hand Hold: 1;15 reps    Forward Step Up Left;Step Height: 4";15 reps;Hand Hold: 1;Right    Other Standing Knee Exercises step onto air-ex forward and side to side x15 each way       Knee/Hip Exercises: Seated   Long Arc Quad Limitations x20 bilateral red band     Heel Slides Limitations hamstring curl x15 bilateral red       Knee/Hip Exercises: Supine   Bridges Both;10 reps;2 sets    Other Supine Knee/Hip Exercises clamshell red 2x10     Other Supine Knee/Hip Exercises Bent knee raise x 20                  PT Education - 03/02/20 1158    Education Details reviewed stret ches and exercises    Person(s) Educated Patient    Methods Explanation;Demonstration;Tactile cues;Verbal cues    Comprehension Verbalized understanding;Returned demonstration;Verbal cues required;Tactile cues required            PT Short Term Goals - 03/02/20 1406      PT SHORT TERM GOAL #1   Title Patient will increase L hip strength to 5/5 in all directions.    Baseline stil 4-5 on mmt flexion but lifting more weight    Time 3    Period Weeks    Status On-going    Target Date 02/17/20      PT SHORT TERM GOAL #2   Title  Patient will improve hip flexion PROM to 60 degrees to decrease stiffness in sitting.    Baseline 45 degrees    Time 3  Period Weeks    Status On-going    Target Date 02/17/20      PT SHORT TERM GOAL #3   Title Patient will transfer sit to stand without the use of her hands    Baseline ABLE WITHOUT HANDS    Time 3    Period Weeks    Status Achieved    Target Date 02/18/20      PT SHORT TERM GOAL #4   Title Patient will independently state hip percautions    Baseline sHE UNDERSTOOD  TO NOT CROSS LEGS AND BEND OVER PAST 90 DEGREES,    Time 3    Period Weeks    Status On-going    Target Date 02/18/20             PT Long Term Goals - 01/27/20 1745      PT LONG TERM GOAL #1   Title Pt will be able to ambulate 1000' with LRAD to get back to walking, per patient goal.    Baseline using a walker. Only able to walk limited distances with the walker    Time 6    Period Weeks    Status New    Target Date 03/09/20      PT LONG TERM GOAL #2   Title Patient will be able to properly ascend and descend stairs with no reports of pain to be able to ambulate around her house and community.    Baseline needs hands and uses her right leg    Time 6    Period Weeks    Status New    Target Date 03/09/20      PT LONG TERM GOAL #3   Title Patient will demonstrate 90 degrees of hip flexion in order to sit on her soft couch comfortably    Baseline 40 degrees    Time 6    Period Weeks    Status New    Target Date 03/10/20                 Plan - 03/02/20 1211    Clinical Impression Statement Patient continues to make good progress. She was able to perform stair training and high level exrcises with minimal pain. Therapy will continue to progress ther-ex as tolerated.    Personal Factors and Comorbidities Comorbidity 3+;Time since onset of injury/illness/exacerbation    Comorbidities history of recurrent infection, obesity, depression    Examination-Activity Limitations  Stand;Squat;Sit    Stability/Clinical Decision Making Evolving/Moderate complexity    Clinical Decision Making Moderate    Rehab Potential Good    PT Frequency 2x / week    PT Duration 6 weeks    PT Treatment/Interventions ADLs/Self Care Home Management;Cryotherapy;Electrical Stimulation;Ultrasound;Traction;Moist Heat;Iontophoresis 4mg /ml Dexamethasone;Gait training;Stair training;Functional mobility training;Therapeutic activities;Therapeutic exercise;Balance training;Neuromuscular re-education;Manual techniques;Patient/family education;Passive range of motion;Dry needling;Joint Manipulations;Spinal Manipulations;Vasopneumatic Device;Taping    PT Next Visit Plan continue hip ROM stretching, hip and quad strengthening; gait training with  cane    PT Home Exercise Plan glute sets, quad sets, heel slides; marching, heel raises, stand lateral hip isometric holds    Consulted and Agree with Plan of Care Patient           Patient will benefit from skilled therapeutic intervention in order to improve the following deficits and impairments:  Abnormal gait, Decreased range of motion, Difficulty walking, Decreased mobility, Decreased strength, Decreased activity tolerance, Decreased knowledge of precautions  Visit Diagnosis: Stiffness of left hip, not elsewhere classified  Difficulty in walking, not elsewhere  classified  Muscle weakness (generalized)     Problem List Patient Active Problem List   Diagnosis Date Noted  . Worsening headaches 01/21/2020  . Pruritus 10/30/2019  . Hypercalcemia 10/24/2019  . Body mass index 45.0-49.9, adult ([place]) 08/27/2019  . Intertrigo 07/09/2019  . Facial rash 07/27/2018  . Body mass index 40.0-44.9, adult ([place]) 02/15/2018  . Fatigue associated with anemia 02/07/2018  . Unspecified open wound, left hip, initial encounter 01/19/2018  . Wound of left leg, sequela 10/03/2016  . 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 Status post left hip  replacement 06/01/2016  . Sinus tachycardia 05/19/2016  . History of adenomatous polyp of colon 03/21/2016  . Diabetes ([place]) 01/21/2016  . Thyroid nodule 12/25/2014  . Infiltrate noted on imaging study   . Cervical spine arthritis 11/14/2014  . Primary osteoarthritis of left hip 11/14/2014  . Loss of consciousness ([place]) 11/14/2014  . Chest pain 03/26/2014  . Musculoskeletal chest and rib pain 03/26/2014  . Rib pain on right side 02/28/2014  . Groin pain 04/18/2013  . UNSPECIFIED RENAL SCLEROSIS 01/16/2009  . GERD, SEVERE 05/25/2007  . Morbid obesity ([place]) 04/03/2007  . Dyslipidemia 08/31/2006  . Major depression, recurrent ([place]) 08/31/2006  . 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 Former smoker 08/31/2006  . HYPERTENSION, BENIGN SYSTEMIC 08/31/2006  . GASTRIC ULCER ACUTE WITHOUT HEMORRHAGE 08/31/2006  . CONVULSIONS, SEIZURES, NOS 08/31/2006    Carney Living PT DPT  03/02/2020, 2:08 PM  [facility] [address] [place], [place], [address] Phone: [phone]   Fax:  [phone]  Name: Kristin [name] MRN: [id number] Date of Birth: [birth year]

## 2020-03-04 ENCOUNTER — Ambulatory Visit: Payer: Medicaid Other | Attending: Orthopaedic Surgery | Admitting: Physical Therapy

## 2020-03-04 ENCOUNTER — Encounter: Payer: Self-pay | Admitting: Physical Therapy

## 2020-03-04 ENCOUNTER — Other Ambulatory Visit: Payer: Self-pay

## 2020-03-04 DIAGNOSIS — M25652 Stiffness of left hip, not elsewhere classified: Secondary | ICD-10-CM | POA: Diagnosis not present

## 2020-03-04 DIAGNOSIS — M6281 Muscle weakness (generalized): Secondary | ICD-10-CM | POA: Insufficient documentation

## 2020-03-04 DIAGNOSIS — R262 Difficulty in walking, not elsewhere classified: Secondary | ICD-10-CM | POA: Diagnosis not present

## 2020-03-04 NOTE — Therapy (Signed)
[place], [place], [address] Phone: [phone]   Fax:  [phone]  Physical Therapy Treatment  Patient Details  Name: RIM THATCH MRN: [id number] Date of Birth: 1955-11-03 Referring Provider (PT): Dr. Leandrew Koyanagi   Encounter Date: 03/04/2020   PT End of Session - 03/04/20 1151    Visit Number 9    Number of Visits 17    Date for PT Re-Evaluation 03/24/20    Authorization Type MCD    PT Start Time 3734    PT Stop Time 1223    PT Time Calculation (min) 38 min    Activity Tolerance Patient tolerated treatment well    Behavior During Therapy [facility] for tasks assessed/performed           Past Medical History:  Diagnosis Date  . Allergy   . Anemia   . Anxiety   . Arthritis   . Back pain, chronic   . Borderline diabetes   . Bronchitis   . Constipation   . Cough   . Depression   . Epilepsy ([place])   . GERD (gastroesophageal reflux disease)   . Hyperlipidemia   . Hypertension   . IBS (irritable bowel syndrome)   . Joint pain   . Obesity   . Palpitations   . Pneumonia 2016  . Pre-diabetes   . 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 Rheumatoid arthritis ([place])   . Seizures ([place])    last >10 yrs ago- recorded 01/18/18  . Somatization disorder   . Tobacco abuse     Past Surgical History:  Procedure Laterality Date  . ABDOMINAL HYSTERECTOMY    . ANTERIOR HIP REVISION Left 07/25/2016   Procedure: LEFT HIP IRRIGATION AND DEBRIDEMENT, REVISION OF HEAD AND LINER;  Surgeon: Leandrew Koyanagi, MD;  Location: [place];  Service: Orthopedics;  Laterality: Left;  . APPLICATION OF WOUND VAC Left 12/23/2019   Procedure: APPLICATION OF WOUND VAC;  Surgeon: Leandrew Koyanagi, MD;  Location: [place];  Service: Orthopedics;  Laterality: Left;  . BREAST EXCISIONAL BIOPSY Right   . CHOLECYSTECTOMY    . COLONOSCOPY    . DEBRIDEMENT AND CLOSURE WOUND Left 07/17/2019   Procedure: Excision of left leg wound  with ACell placement and primary closure;  Surgeon: Wallace Going, DO;  Location: [place];  Service: Plastics;  Laterality: Left;  60 min  . EXCISIONAL TOTAL HIP ARTHROPLASTY WITH ANTIBIOTIC SPACERS Left 12/23/2019   Procedure: LEFT HIP IRRIGATION AND DEBRIDEMENT LEFT HIP WOUND and poly exchange ;  Surgeon: Leandrew Koyanagi, MD;  Location: [place];  Service: Orthopedics;  Laterality: Left;  . HIP DEBRIDEMENT Left 01/19/2018   Procedure(s) Performed: IRRIGATION AND DEBRIDEMENT LEFT HIP (Left )  . INCISION AND DRAINAGE HIP Left 08/03/2016   Procedure: IRRIGATION AND DEBRIDEMENT LEFT HIP, VAC PLACEMENT;  Surgeon: Leandrew Koyanagi, MD;  Location: [place];  Service: Orthopedics;  Laterality: Left;  . INCISION AND DRAINAGE HIP Left 01/19/2018   Procedure: IRRIGATION AND DEBRIDEMENT LEFT HIP;  Surgeon: Leandrew Koyanagi, MD;  Location: [place];  Service: Orthopedics;  Laterality: Left;  . kidney stent Right 2002  . TONSILLECTOMY Bilateral 12/06/2014   Procedure: TONSILLECTOMY, BILATERAL;  Surgeon: Ruby Cola, MD;  Location: [place];  Service: ENT;  Laterality: Bilateral;  . TONSILLECTOMY    . TOTAL HIP ARTHROPLASTY Left 06/01/2016   Procedure: LEFT TOTAL HIP ARTHROPLASTY ANTERIOR APPROACH;  Surgeon: Georga Kaufmann  Ephriam Jenkins, MD;  Location: [place];  Service: Orthopedics;  Laterality: Left;  . TUBAL LIGATION    . WOUND EXPLORATION Left 01/19/2018   Procedure: WOUND EXPLORATION;  Surgeon: Leandrew Koyanagi, MD;  Location: [place];  Service: Orthopedics;  Laterality: Left;    There were no vitals filed for this visit.   Subjective Assessment - 03/04/20 1149    Subjective Patient reports just a little soreness after the lastvivist. She is not having pain today.    Pertinent History depression, obesity, arthritis    Limitations Sitting;Standing;Walking    How long can you sit comfortably? stiff after sitting for a long time    How long can you stand comfortably? < 10 min    How long can you walk comfortably? limited  community distances    Diagnostic tests Xray in 2020; MRI    Patient Stated Goals get back to walking    Currently in Pain? No/denies                             [facility] Adult PT Treatment/Exercise - 03/04/20 0001      Knee/Hip Exercises: Stretches   Other Knee/Hip Stretches lower trunk rotation x20       Knee/Hip Exercises: Aerobic   Nustep L6   6 min LE/UE   Then at end of session L      Knee/Hip Exercises: Standing   Heel Raises 2 sets;10 reps    Knee Flexion 20 reps    Knee Flexion Limitations 3#     Forward Step Up Left;15 reps;Hand Hold: 1;Right;Step Height: 6"    Step Down 2 sets;10 reps;Step Height: 4"    Functional Squat Limitations 2x10    Other Standing Knee Exercises sit to stand transfer 2x10     Other Standing Knee Exercises step onto air-ex 2x15       Knee/Hip Exercises: Seated   Long Arc Quad Limitations x20 bilateral red band     Heel Slides Limitations hamstring curl x15 bilateral red     Sit to Sand 2 sets;10 reps      Knee/Hip Exercises: Supine   Bridges Both;10 reps;2 sets    Other Supine Knee/Hip Exercises clamshell greenx20     Other Supine Knee/Hip Exercises Bent knee raise 2x10 1lb                   PT Education - 03/04/20 1150    Education Details technique with ther-ex    Person(s) Educated Patient    Methods Explanation;Demonstration;Tactile cues;Verbal cues    Comprehension Verbalized understanding;Returned demonstration;Verbal cues required;Tactile cues required            PT Short Term Goals - 03/02/20 1406      PT SHORT TERM GOAL #1   Title Patient will increase L hip strength to 5/5 in all directions.    Baseline stil 4-5 on mmt flexion but lifting more weight    Time 3    Period Weeks    Status On-going    Target Date 02/17/20      PT SHORT TERM GOAL #2   Title Patient will improve hip flexion PROM to 60 degrees to decrease stiffness in sitting.    Baseline 45 degrees    Time 3    Period Weeks     Status On-going    Target Date 02/17/20      PT SHORT TERM GOAL #3   Title  Patient will transfer sit to stand without the use of her hands    Baseline ABLE WITHOUT HANDS    Time 3    Period Weeks    Status Achieved    Target Date 02/18/20      PT SHORT TERM GOAL #4   Title Patient will independently state hip percautions    Baseline sHE UNDERSTOOD  TO NOT CROSS LEGS AND BEND OVER PAST 90 DEGREES,    Time 3    Period Weeks    Status On-going    Target Date 02/18/20             PT Long Term Goals - 01/27/20 1745      PT LONG TERM GOAL #1   Title Pt will be able to ambulate 1000' with LRAD to get back to walking, per patient goal.    Baseline using a walker. Only able to walk limited distances with the walker    Time 6    Period Weeks    Status New    Target Date 03/09/20      PT LONG TERM GOAL #2   Title Patient will be able to properly ascend and descend stairs with no reports of pain to be able to ambulate around her house and community.    Baseline needs hands and uses her right leg    Time 6    Period Weeks    Status New    Target Date 03/09/20      PT LONG TERM GOAL #3   Title Patient will demonstrate 90 degrees of hip flexion in order to sit on her soft couch comfortably    Baseline 40 degrees    Time 6    Period Weeks    Status New    Target Date 03/10/20                 Plan - 03/04/20 1728    Clinical Impression Statement Therapy added instability work and stepping over a hurdle. She had no significant increase in pain. Therapy will continue to progress standing exercise sas tolerated.    Personal Factors and Comorbidities Comorbidity 3+;Time since onset of injury/illness/exacerbation    Comorbidities history of recurrent infection, obesity, depression    Examination-Activity Limitations Stand;Squat;Sit    Stability/Clinical Decision Making Evolving/Moderate complexity    Clinical Decision Making Moderate    Rehab Potential Good    PT  Frequency 2x / week    PT Duration 6 weeks    PT Treatment/Interventions ADLs/Self Care Home Management;Cryotherapy;Electrical Stimulation;Ultrasound;Traction;Moist Heat;Iontophoresis 4mg /ml Dexamethasone;Gait training;Stair training;Functional mobility training;Therapeutic activities;Therapeutic exercise;Balance training;Neuromuscular re-education;Manual techniques;Patient/family education;Passive range of motion;Dry needling;Joint Manipulations;Spinal Manipulations;Vasopneumatic Device;Taping    PT Next Visit Plan continue hip ROM stretching, hip and quad strengthening; gait training with  cane    PT Home Exercise Plan glute sets, quad sets, heel slides; marching, heel raises, stand lateral hip isometric holds    Consulted and Agree with Plan of Care Patient           Patient will benefit from skilled therapeutic intervention in order to improve the following deficits and impairments:  Abnormal gait, Decreased range of motion, Difficulty walking, Decreased mobility, Decreased strength, Decreased activity tolerance, Decreased knowledge of precautions  Visit Diagnosis: Stiffness of left hip, not elsewhere classified  Difficulty in walking, not elsewhere classified  Muscle weakness (generalized)     Problem List Patient Active Problem List   Diagnosis Date Noted  . Worsening headaches 01/21/2020  . Pruritus 10/30/2019  .  Hypercalcemia 10/24/2019  . Body mass index 45.0-49.9, adult ([place]) 08/27/2019  . Intertrigo 07/09/2019  . Facial rash 07/27/2018  . Body mass index 40.0-44.9, adult ([place]) 02/15/2018  . Fatigue associated with anemia 02/07/2018  . Unspecified open wound, left hip, initial encounter 01/19/2018  . Wound of left leg, sequela 10/03/2016  . 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 Status post left hip replacement 06/01/2016  . Sinus tachycardia 05/19/2016  . History of adenomatous polyp of colon 03/21/2016  . Diabetes ([place]) 01/21/2016  . Thyroid  nodule 12/25/2014  . Infiltrate noted on imaging study   . Cervical spine arthritis 11/14/2014  . Primary osteoarthritis of left hip 11/14/2014  . Loss of consciousness ([place]) 11/14/2014  . Chest pain 03/26/2014  . Musculoskeletal chest and rib pain 03/26/2014  . Rib pain on right side 02/28/2014  . Groin pain 04/18/2013  . UNSPECIFIED RENAL SCLEROSIS 01/16/2009  . GERD, SEVERE 05/25/2007  . Morbid obesity ([place]) 04/03/2007  . Dyslipidemia 08/31/2006  . Major depression, recurrent ([place]) 08/31/2006  . 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 Former smoker 08/31/2006  . HYPERTENSION, BENIGN SYSTEMIC 08/31/2006  . GASTRIC ULCER ACUTE WITHOUT HEMORRHAGE 08/31/2006  . CONVULSIONS, SEIZURES, NOS 08/31/2006    Carney Living PT DPT  03/04/2020, 5:30 PM  [facility] [address] [place], [place], [address] Phone: [phone]   Fax:  [phone]  Name: MONIE SHERE MRN: [id number] Date of Birth: 12/11/1955

## 2020-03-10 ENCOUNTER — Ambulatory Visit: Payer: Medicaid Other | Admitting: Physical Therapy

## 2020-03-10 ENCOUNTER — Encounter: Payer: Self-pay | Admitting: Physical Therapy

## 2020-03-10 ENCOUNTER — Other Ambulatory Visit: Payer: Self-pay

## 2020-03-10 DIAGNOSIS — M25652 Stiffness of left hip, not elsewhere classified: Secondary | ICD-10-CM | POA: Diagnosis not present

## 2020-03-10 DIAGNOSIS — M6281 Muscle weakness (generalized): Secondary | ICD-10-CM

## 2020-03-10 DIAGNOSIS — R262 Difficulty in walking, not elsewhere classified: Secondary | ICD-10-CM | POA: Diagnosis not present

## 2020-03-10 NOTE — Therapy (Signed)
[place], [place], [address] Phone: [phone]   Fax:  [phone]  Physical Therapy Treatment  Patient Details  Name: SOPHIAGRACE BENBROOK MRN: [id number] Date of Birth: [birth year] Referring Provider (PT): Dr. Leandrew Koyanagi   Encounter Date: 03/10/2020   PT End of Session - 03/10/20 1220    Visit Number 10    Number of Visits 17    Date for PT Re-Evaluation 03/24/20    Authorization Type MCD    PT Start Time 1150   therapist late   PT Stop Time 19    PT Time Calculation (min) 40 min    Activity Tolerance Patient tolerated treatment well    Behavior During Therapy [facility] for tasks assessed/performed           Past Medical History:  Diagnosis Date   Allergy    Anemia    Anxiety    Arthritis    Back pain, chronic    Borderline diabetes    Bronchitis    Constipation    Cough    Depression    Epilepsy ([place])    GERD (gastroesophageal reflux disease)    Hyperlipidemia    Hypertension    IBS (irritable bowel syndrome)    Joint pain    Obesity    Palpitations    Pneumonia 2016   Pre-diabetes    Renal sclerosis, unspecified    only has 1 kidney   Rheumatoid arthritis ([place])    Seizures ([place])    last >10 yrs ago- recorded 01/18/18   Somatization disorder    Tobacco abuse     Past Surgical History:  Procedure Laterality Date   ABDOMINAL HYSTERECTOMY     ANTERIOR HIP REVISION Left 07/25/2016   Procedure: LEFT HIP IRRIGATION AND DEBRIDEMENT, REVISION OF HEAD AND LINER;  Surgeon: Leandrew Koyanagi, MD;  Location: [facility];  Service: Orthopedics;  Laterality: Left;   APPLICATION OF WOUND VAC Left 12/23/2019   Procedure: APPLICATION OF WOUND VAC;  Surgeon: Leandrew Koyanagi, MD;  Location: [place];  Service: Orthopedics;  Laterality: Left;   BREAST EXCISIONAL BIOPSY Right    CHOLECYSTECTOMY     COLONOSCOPY     DEBRIDEMENT AND CLOSURE WOUND Left 07/17/2019   Procedure: Excision of  left leg wound with ACell placement and primary closure;  Surgeon: Wallace Going, DO;  Location: [facility];  Service: Plastics;  Laterality: Left;  60 min   EXCISIONAL TOTAL HIP ARTHROPLASTY WITH ANTIBIOTIC SPACERS Left 12/23/2019   Procedure: LEFT HIP IRRIGATION AND DEBRIDEMENT LEFT HIP WOUND and poly exchange ;  Surgeon: Leandrew Koyanagi, MD;  Location: [place];  Service: Orthopedics;  Laterality: Left;   HIP DEBRIDEMENT Left 01/19/2018   Procedure(s) Performed: IRRIGATION AND DEBRIDEMENT LEFT HIP (Left )   INCISION AND DRAINAGE HIP Left 08/03/2016   Procedure: IRRIGATION AND DEBRIDEMENT LEFT HIP, VAC PLACEMENT;  Surgeon: Leandrew Koyanagi, MD;  Location: [facility] [place];  Service: Orthopedics;  Laterality: Left;   INCISION AND DRAINAGE HIP Left 01/19/2018   Procedure: IRRIGATION AND DEBRIDEMENT LEFT HIP;  Surgeon: Leandrew Koyanagi, MD;  Location: [place] [place];  Service: Orthopedics;  Laterality: Left;   kidney stent Right 2002   TONSILLECTOMY Bilateral 12/06/2014   Procedure: TONSILLECTOMY, BILATERAL;  Surgeon: Ruby Cola, MD;  Location: [facility];  Service: ENT;  Laterality: Bilateral;   TONSILLECTOMY     TOTAL HIP ARTHROPLASTY Left 06/01/2016   Procedure: LEFT TOTAL HIP ARTHROPLASTY ANTERIOR APPROACH;  Surgeon: Leandrew Koyanagi, MD;  Location: [facility];  Service: Orthopedics;  Laterality: Left;   TUBAL LIGATION     WOUND EXPLORATION Left 01/19/2018   Procedure: WOUND EXPLORATION;  Surgeon: Leandrew Koyanagi, MD;  Location: [facility];  Service: Orthopedics;  Laterality: Left;    There were no vitals filed for this visit.   Subjective Assessment - 03/10/20 1156    Subjective Patient overdid it this weekend. She stood and cooked for a few hours. She also helped pack and move things out of her aunts house. She used ice and res tover the past few days.    Pertinent History depression, obesity, arthritis    Limitations Sitting;Standing;Walking    How long can you sit comfortably? stiff after sitting for a long  time    How long can you stand comfortably? < 10 min    How long can you walk comfortably? limited community distances    Diagnostic tests Xray in 2020; MRI    Patient Stated Goals get back to walking    Currently in Pain? Yes    Pain Score 2     Pain Location Hip    Pain Orientation Left    Pain Descriptors / Indicators Aching    Pain Type Chronic pain    Pain Onset More than a month ago    Pain Frequency Intermittent    Aggravating Factors  weight bearing    Pain Relieving Factors rest    Effect of Pain on Daily Activities difficulty perfroming ALD's                             [facility] Adult PT Treatment/Exercise - 03/10/20 0001      Knee/Hip Exercises: Stretches   Other Knee/Hip Stretches lower trunk rotation x20       Knee/Hip Exercises: Aerobic   Nustep L6   6 min LE/UE   Then at end of session L      Knee/Hip Exercises: Standing   Heel Raises 2 sets;10 reps    Knee Flexion 20 reps    Knee Flexion Limitations 3#     Hip Abduction Left;Right;10 reps    Lateral Step Up Left;Step Height: 4";Hand Hold: 1;15 reps    Forward Step Up Left;15 reps;Hand Hold: 1;Right;Step Height: 6"    Functional Squat Limitations 2x10      Knee/Hip Exercises: Seated   Long Arc Quad Limitations x20 bilateral red band       Knee/Hip Exercises: Supine   Bridges Both;10 reps;2 sets    Other Supine Knee/Hip Exercises clamshell redx20; double knee to chest with ball 2x10     Other Supine Knee/Hip Exercises Bent knee raise 2x10 1lb       Manual Therapy   Passive ROM assessed passive ROM of the lefft hip, depsite recent flair  up                   PT Education - 03/10/20 1158    Education Details reviewed HEP and symptom mang[place]    Person(s) Educated Patient    Methods Explanation;Demonstration;Verbal cues;Tactile cues    Comprehension Verbalized understanding;Returned demonstration;Verbal cues required;Tactile cues required            PT Short Term Goals -  03/10/20 1226      PT SHORT TERM GOAL #1   Title Patient will increase L hip strength to 5/5 in all directions.    Baseline stil 4-5  on mmt flexion but lifting more weight    Time 3    Period Weeks    Status On-going    Target Date 02/17/20      PT SHORT TERM GOAL #2   Title Patient will improve hip flexion PROM to 60 degrees to decrease stiffness in sitting.    Baseline 45 degrees    Time 3    Period Weeks    Status On-going    Target Date 02/17/20             PT Long Term Goals - 01/27/20 1745      PT LONG TERM GOAL #1   Title Pt will be able to ambulate 1000' with LRAD to get back to walking, per patient goal.    Baseline using a walker. Only able to walk limited distances with the walker    Time 6    Period Weeks    Status New    Target Date 03/09/20      PT LONG TERM GOAL #2   Title Patient will be able to properly ascend and descend stairs with no reports of pain to be able to ambulate around her house and community.    Baseline needs hands and uses her right leg    Time 6    Period Weeks    Status New    Target Date 03/09/20      PT LONG TERM GOAL #3   Title Patient will demonstrate 90 degrees of hip flexion in order to sit on her soft couch comfortably    Baseline 40 degrees    Time 6    Period Weeks    Status New    Target Date 03/10/20                 Plan - 03/10/20 1220    Clinical Impression Statement Patient had mild tightness with exercises today. She had no significant increase in pain. She was advised in a fe hours to see how her pain is doing. Therapy continue to work on functional strengthening with the patient including steps and squats. Therapyassessed her range 2nd to recent flair and she continues to have full hip flexion. Therapy will continue to progress as tolerated.    Personal Factors and Comorbidities Comorbidity 3+;Time since onset of injury/illness/exacerbation    Comorbidities history of recurrent infection, obesity,  depression    Examination-Activity Limitations Stand;Squat;Sit    Stability/Clinical Decision Making Evolving/Moderate complexity    Clinical Decision Making Moderate    Rehab Potential Good    PT Frequency 2x / week    PT Duration 6 weeks    PT Treatment/Interventions ADLs/Self Care Home Management;Cryotherapy;Electrical Stimulation;Ultrasound;Traction;Moist Heat;Iontophoresis 4mg /ml Dexamethasone;Gait training;Stair training;Functional mobility training;Therapeutic activities;Therapeutic exercise;Balance training;Neuromuscular re-education;Manual techniques;Patient/family education;Passive range of motion;Dry needling;Joint Manipulations;Spinal Manipulations;Vasopneumatic Device;Taping    PT Next Visit Plan continue hip ROM stretching, hip and quad strengthening; gait training with  cane    PT Home Exercise Plan glute sets, quad sets, heel slides; marching, heel raises, stand lateral hip isometric holds    Consulted and Agree with Plan of Care Patient           Patient will benefit from skilled therapeutic intervention in order to improve the following deficits and impairments:  Abnormal gait, Decreased range of motion, Difficulty walking, Decreased mobility, Decreased strength, Decreased activity tolerance, Decreased knowledge of precautions  Visit Diagnosis: Stiffness of left hip, not elsewhere classified  Difficulty in walking, not elsewhere classified  Muscle weakness (generalized)  Problem List Patient Active Problem List   Diagnosis Date Noted   Worsening headaches 01/21/2020   Pruritus 10/30/2019   Hypercalcemia 10/24/2019   Body mass index 45.0-49.9, adult ([place]) 08/27/2019   Intertrigo 07/09/2019   Facial rash 07/27/2018   Body mass index 40.0-44.9, adult ([place]) 02/15/2018   Fatigue associated with anemia 02/07/2018   Unspecified open wound, left hip, initial encounter 01/19/2018   Wound of left leg, sequela 10/03/2016   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[place]) 08/24/2016   Status post left hip replacement 06/01/2016   Sinus tachycardia 05/19/2016   History of adenomatous polyp of colon 03/21/2016   Diabetes ([place]) 01/21/2016   Thyroid nodule 12/25/2014   Infiltrate noted on imaging study    Cervical spine arthritis 11/14/2014   Primary osteoarthritis of left hip 11/14/2014   Loss of consciousness ([place]) 11/14/2014   Chest pain 03/26/2014   Musculoskeletal chest and rib pain 03/26/2014   Rib pain on right side 02/28/2014   Groin pain 04/18/2013   UNSPECIFIED RENAL SCLEROSIS 01/16/2009   GERD, SEVERE 05/25/2007   Morbid obesity ([place]) 04/03/2007   Dyslipidemia 08/31/2006   Major depression, recurrent ([place]) 08/31/2006   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   Former smoker 08/31/2006   HYPERTENSION, BENIGN SYSTEMIC 08/31/2006   GASTRIC ULCER ACUTE WITHOUT HEMORRHAGE 08/31/2006   CONVULSIONS, SEIZURES, NOS 08/31/2006    Carney Living PT DPT  03/10/2020, 1:09 PM  [facility] [address] [place], [place], [address] Phone: [phone]   Fax:  [phone]  Name: ASPEN LAWRANCE MRN: [id number] Date of Birth: Jun 25, 1956

## 2020-03-12 ENCOUNTER — Encounter: Payer: Self-pay | Admitting: Internal Medicine

## 2020-03-12 ENCOUNTER — Other Ambulatory Visit: Payer: Self-pay

## 2020-03-12 ENCOUNTER — Ambulatory Visit: Payer: Medicaid Other | Admitting: Internal Medicine

## 2020-03-12 DIAGNOSIS — T8452XD Infection and inflammatory reaction due to internal left hip prosthesis, subsequent encounter: Secondary | ICD-10-CM

## 2020-03-12 NOTE — Assessment & Plan Note (Signed)
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  She will continue doxycycline and get repeat inflammatory markers today.  She will follow-up here in 6 weeks.

## 2020-03-12 NOTE — Progress Notes (Signed)
[facility] for Infectious Disease  Patient Active Problem List   Diagnosis Date Noted  . 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[place]) 08/24/2016    Priority: High  . Worsening headaches 01/21/2020  . Pruritus 10/30/2019  . Hypercalcemia 10/24/2019  . Body mass index 45.0-49.9, adult ([place]) 08/27/2019  . Intertrigo 07/09/2019  . Facial rash 07/27/2018  . Body mass index 40.0-44.9, adult ([place]) 02/15/2018  . Fatigue associated with anemia 02/07/2018  . Unspecified open wound, left hip, initial encounter 01/19/2018  . Wound of left leg, sequela 10/03/2016  . Status post left hip replacement 06/01/2016  . Sinus tachycardia 05/19/2016  . History of adenomatous polyp of colon 03/21/2016  . Diabetes ([place]) 01/21/2016  . Thyroid nodule 12/25/2014  . Infiltrate noted on imaging study   . Cervical spine arthritis 11/14/2014  . Primary osteoarthritis of left hip 11/14/2014  . Loss of consciousness ([place]) 11/14/2014  . Chest pain 03/26/2014  . Musculoskeletal chest and rib pain 03/26/2014  . Rib pain on right side 02/28/2014  . Groin pain 04/18/2013  . UNSPECIFIED RENAL SCLEROSIS 01/16/2009  . GERD, SEVERE 05/25/2007  . Morbid obesity ([place]) 04/03/2007  . Dyslipidemia 08/31/2006  . Major depression, recurrent ([place]) 08/31/2006  . 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 Former smoker 08/31/2006  . HYPERTENSION, BENIGN SYSTEMIC 08/31/2006  . GASTRIC ULCER ACUTE WITHOUT HEMORRHAGE 08/31/2006  . CONVULSIONS, SEIZURES, NOS 08/31/2006    Patient's Medications  New Prescriptions   No medications on file  Previous Medications   ACETAMINOPHEN (TYLENOL) 650 MG CR TABLET    Take 1,300 mg by mouth every 8 (eight) hours as needed for pain.   AMITRIPTYLINE (ELAVIL) 75 MG TABLET    Take 2 tablets (150 mg total) by mouth at bedtime.   ASCORBIC ACID (VITAMIN C) 250 MG CHEW    Chew 250 mg by mouth daily.   CHOLECALCIFEROL (VITAMIN D3) 25 MCG (1000 UNIT) TABLET    Take 1,000 Units by mouth  daily.   COLLAGENASE (SANTYL) OINTMENT    Apply 1 application topically daily.   CYCLOBENZAPRINE (FLEXERIL) 10 MG TABLET    Take 0.5 tablets (5 mg total) by mouth at bedtime.   DOXYCYCLINE (VIBRA-TABS) 100 MG TABLET    Take 1 tablet (100 mg total) by mouth 2 (two) times daily.   FERROUS SULFATE 325 (65 FE) MG TABLET    Take 1 tablet (325 mg total) by mouth 2 (two) times daily.   HYDROCHLOROTHIAZIDE (HYDRODIURIL) 25 MG TABLET    Take 1 tablet (25 mg total) by mouth daily.   IBUPROFEN (ADVIL) 200 MG TABLET    Take 400 mg by mouth every 6 (six) hours as needed for moderate pain.   LEVOCETIRIZINE (XYZAL) 5 MG TABLET    Take 1 tablet (5 mg total) by mouth every evening.   METFORMIN (GLUCOPHAGE) 500 MG TABLET    Take 1 tablet (500 mg total) by mouth 2 (two) times daily.   METOPROLOL TARTRATE (LOPRESSOR) 25 MG TABLET    Take 1 tablet (25 mg total) by mouth 2 (two) times daily.   OMEPRAZOLE (PRILOSEC) 20 MG CAPSULE    Take 20 mg by mouth at bedtime.   SIMVASTATIN (ZOCOR) 20 MG TABLET    Take 1 tablet (20 mg total) by mouth at bedtime.   TOPIRAMATE (TOPAMAX) 50 MG TABLET    Take 1 tablet (50 mg total) by mouth daily.   ZINC GLUCONATE 50 MG TABLET    Take 50  mg by mouth daily.  Modified Medications   No medications on file  Discontinued Medications   No medications on file    Subjective: Kristin [name] is in for her hospital follow-up visit.  She is a 64 y.o. female who underwent left total hip arthroplasty on 06/01/2016.  She developed acute infection with MRSA in January 2018.  She underwent incision and drainage with polyexchange and was discharged on IV vancomycin before converting to oral doxycycline.  She completed therapy in June 2018.  She has had intermittent boils form along her left hip incision since that time.  She has been treated with oral trimethoprim sulfamethoxazole.  She underwent scar revision on 01/19/2018.  No organisms were seen on Gram stain and cultures were negative.  Dr. Lyndee Leo  D[place] excised an 8 cm deep fistula on 07/17/2019.  There was no evidence of infection noted in the operative report and no cultures were obtained.  A CT scan this past February showed some sclerosis and fluid around the prosthesis.  An x-ray on 11/12/2019 showed lucency around the femoral stem.  She stopped taking trimethoprim sulfamethoxazole on 12/13/2019 and was admitted on 12/23/2019 with a plan for probable excision arthroplasty.  At the time of surgery the prosthesis was noted to be firmly in place with sclerotic bone.  She underwent debridement and polyexchange again.  No organisms were seen on stain and 1 73f 5 operative cultures grew rare Prevotella after she was discharged.  Initially she was sent her home on IV vancomycin and rifampin but I added metronidazole after discharge.  She completed 6 weeks of IVantibiotic therapy on 02/05/2020 before switching to oral doxycycline.  She has not had any problems tolerating doxycycline.  She has started physical therapy and is feeling better.  She says that she takes occasional ibuprofen for hip pain.  Review of Systems: Review of Systems  Constitutional: Negative for fever.  Gastrointestinal: Negative for abdominal pain, diarrhea, nausea and vomiting.  Musculoskeletal: Positive for joint pain.    Past Medical History:  Diagnosis Date  . Allergy   . Anemia   . Anxiety   . Arthritis   . Back pain, chronic   . Borderline diabetes   . Bronchitis   . Constipation   . Cough   . Depression   . Epilepsy ([place])   . GERD (gastroesophageal reflux disease)   . Hyperlipidemia   . Hypertension   . IBS (irritable bowel syndrome)   . Joint pain   . Obesity   . Palpitations   . Pneumonia 2016  . Pre-diabetes   . 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 Rheumatoid arthritis ([place])   . Seizures ([place])    last >10 yrs ago- recorded 01/18/18  . Somatization disorder   . Tobacco abuse     Social History   Tobacco Use  . Smoking status:  Former Smoker    Packs/day: 0.25    Years: 0.50    Pack years: 0.12    Types: Cigarettes    Quit date: 01/27/2014    Years since quitting: 6.1  . Smokeless tobacco: Never Used  Vaping Use  . Vaping Use: Never used  Substance Use Topics  . Alcohol use: No  . Drug use: No    Family History  Problem Relation Age of Onset  . Other Mother        failed surgery  . Heart Problems Father        poss MI, not sure:per  pt  . Alzheimer's disease Maternal Grandmother   . Heart disease Maternal Grandfather   . Colon cancer Maternal Uncle        not sure age of onset    Allergies  Allergen Reactions  . Fluconazole Swelling and Other (See Comments)    Face swelling  . Robitussin (Alcohol Free) [Guaifenesin] Palpitations and Other (See Comments)    Chest pain    Objective: Vitals:   03/12/20 1407  BP: 112/72  Pulse: (!) 103  Temp: 98 F (36.7 C)  TempSrc: Oral  Weight: 236 lb (107 kg)   Body mass index is 43.16 kg/m.  Physical Exam Constitutional:      Comments: She is calm and pleasant.   Musculoskeletal:        General: No swelling or tenderness.     Comments: Her left hip incisions have healed nicely  Psychiatric:        Mood and Affect: Mood normal.     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  She will continue doxycycline and get repeat inflammatory markers today.  She will follow-up here in 6 weeks.      Relevant Orders   C-reactive protein   Sedimentation rate       Michel Bickers, MD [facility] [place] [phone] pager   [phone]  cell 03/12/2020, 2:18 PM

## 2020-03-13 ENCOUNTER — Encounter: Payer: Self-pay | Admitting: Physical Therapy

## 2020-03-13 ENCOUNTER — Ambulatory Visit: Payer: Medicaid Other | Admitting: Physical Therapy

## 2020-03-13 DIAGNOSIS — M25652 Stiffness of left hip, not elsewhere classified: Secondary | ICD-10-CM | POA: Diagnosis not present

## 2020-03-13 DIAGNOSIS — R262 Difficulty in walking, not elsewhere classified: Secondary | ICD-10-CM | POA: Diagnosis not present

## 2020-03-13 DIAGNOSIS — M6281 Muscle weakness (generalized): Secondary | ICD-10-CM

## 2020-03-13 LAB — C-REACTIVE PROTEIN: CRP: 42.7 mg/L — ABNORMAL HIGH (ref <8.0)

## 2020-03-13 LAB — SEDIMENTATION RATE: Sed Rate: 82 mm/h — ABNORMAL HIGH (ref 0–30)

## 2020-03-13 NOTE — Therapy (Signed)
[place], [place], [address] Phone: [phone]   Fax:  [phone]  Physical Therapy Treatment  Patient Details  Name: Kristin [name] MRN: [id number] Date of Birth: 1955-08-25 Referring Provider (PT): Dr. Leandrew Koyanagi   Encounter Date: 03/13/2020   PT End of Session - 03/13/20 1025    Visit Number 11    Number of Visits 17    Date for PT Re-Evaluation 03/24/20    Authorization Type MCD    PT Start Time 1015    PT Stop Time 1056    PT Time Calculation (min) 41 min    Activity Tolerance Patient tolerated treatment well    Behavior During Therapy [facility] for tasks assessed/performed           Past Medical History:  Diagnosis Date  . Allergy   . Anemia   . Anxiety   . Arthritis   . Back pain, chronic   . Borderline diabetes   . Bronchitis   . Constipation   . Cough   . Depression   . Epilepsy ([place])   . GERD (gastroesophageal reflux disease)   . Hyperlipidemia   . Hypertension   . IBS (irritable bowel syndrome)   . Joint pain   . Obesity   . Palpitations   . Pneumonia 2016  . Pre-diabetes   . 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 Rheumatoid arthritis ([place])   . Seizures ([place])    last >10 yrs ago- recorded 01/18/18  . Somatization disorder   . Tobacco abuse     Past Surgical History:  Procedure Laterality Date  . ABDOMINAL HYSTERECTOMY    . ANTERIOR HIP REVISION Left 07/25/2016   Procedure: LEFT HIP IRRIGATION AND DEBRIDEMENT, REVISION OF HEAD AND LINER;  Surgeon: Leandrew Koyanagi, MD;  Location: [place];  Service: Orthopedics;  Laterality: Left;  . APPLICATION OF WOUND VAC Left 12/23/2019   Procedure: APPLICATION OF WOUND VAC;  Surgeon: Leandrew Koyanagi, MD;  Location: [place];  Service: Orthopedics;  Laterality: Left;  . BREAST EXCISIONAL BIOPSY Right   . CHOLECYSTECTOMY    . COLONOSCOPY    . DEBRIDEMENT AND CLOSURE WOUND Left 07/17/2019   Procedure: Excision of left leg wound  with ACell placement and primary closure;  Surgeon: Wallace Going, DO;  Location: [place];  Service: Plastics;  Laterality: Left;  60 min  . EXCISIONAL TOTAL HIP ARTHROPLASTY WITH ANTIBIOTIC SPACERS Left 12/23/2019   Procedure: LEFT HIP IRRIGATION AND DEBRIDEMENT LEFT HIP WOUND and poly exchange ;  Surgeon: Leandrew Koyanagi, MD;  Location: [place];  Service: Orthopedics;  Laterality: Left;  . HIP DEBRIDEMENT Left 01/19/2018   Procedure(s) Performed: IRRIGATION AND DEBRIDEMENT LEFT HIP (Left )  . INCISION AND DRAINAGE HIP Left 08/03/2016   Procedure: IRRIGATION AND DEBRIDEMENT LEFT HIP, VAC PLACEMENT;  Surgeon: Leandrew Koyanagi, MD;  Location: [place];  Service: Orthopedics;  Laterality: Left;  . INCISION AND DRAINAGE HIP Left 01/19/2018   Procedure: IRRIGATION AND DEBRIDEMENT LEFT HIP;  Surgeon: Leandrew Koyanagi, MD;  Location: [place];  Service: Orthopedics;  Laterality: Left;  . kidney stent Right 2002  . TONSILLECTOMY Bilateral 12/06/2014   Procedure: TONSILLECTOMY, BILATERAL;  Surgeon: Ruby Cola, MD;  Location: [place];  Service: ENT;  Laterality: Bilateral;  . TONSILLECTOMY    . TOTAL HIP ARTHROPLASTY Left 06/01/2016   Procedure: LEFT TOTAL HIP ARTHROPLASTY ANTERIOR APPROACH;  Surgeon: Georga Kaufmann  Ephriam Jenkins, MD;  Location: [place];  Service: Orthopedics;  Laterality: Left;  . TUBAL LIGATION    . WOUND EXPLORATION Left 01/19/2018   Procedure: WOUND EXPLORATION;  Surgeon: Leandrew Koyanagi, MD;  Location: [place];  Service: Orthopedics;  Laterality: Left;    There were no vitals filed for this visit.   Subjective Assessment - 03/13/20 1023    Subjective Patient reports minor soreness today. her soreness is around the scar. She dosent rememebr doing anything.    Pertinent History depression, obesity, arthritis    Limitations Sitting;Standing;Walking    How long can you sit comfortably? stiff after sitting for a long time    How long can you stand comfortably? < 10 min    How long can you walk  comfortably? limited community distances    Diagnostic tests Xray in 2020; MRI    Patient Stated Goals get back to walking    Currently in Pain? Yes    Pain Score 4     Pain Location Hip    Pain Orientation Left    Pain Descriptors / Indicators Aching    Pain Type Chronic pain    Pain Onset More than a month ago    Pain Frequency Intermittent    Aggravating Factors  Standing and walking    Pain Relieving Factors comes and goes    Effect of Pain on Daily Activities pain at times when cooking                             [facility] Adult PT Treatment/Exercise - 03/13/20 0001      Ambulation/Gait   Gait Comments worked in II barrs on putting weight on foot and controlling lateral movement. Mod cuing required to use enough UE strength to control her lateral movement. Hurdle walk 3 hurdles. Difficulty at first but improved with laps;       Knee/Hip Exercises: Standing   Functional Squat Limitations x20       Knee/Hip Exercises: Supine   Bridges Both;10 reps;2 sets    Other Supine Knee/Hip Exercises clamshell redx20; double knee to chest with ball 2x10     Other Supine Knee/Hip Exercises Bent knee raise 2x10 1lb                   PT Education - 03/13/20 1025    Education Details HEP and symptom mangement    Person(s) Educated Patient    Methods Explanation;Tactile cues;Demonstration;Verbal cues    Comprehension Verbalized understanding;Returned demonstration;Verbal cues required;Tactile cues required            PT Short Term Goals - 03/10/20 1226      PT SHORT TERM GOAL #1   Title Patient will increase L hip strength to 5/5 in all directions.    Baseline stil 4-5 on mmt flexion but lifting more weight    Time 3    Period Weeks    Status On-going    Target Date 02/17/20      PT SHORT TERM GOAL #2   Title Patient will improve hip flexion PROM to 60 degrees to decrease stiffness in sitting.    Baseline 45 degrees    Time 3    Period Weeks    Status  On-going    Target Date 02/17/20             PT Long Term Goals - 01/27/20 1745      PT LONG  TERM GOAL #1   Title Pt will be able to ambulate 1000' with LRAD to get back to walking, per patient goal.    Baseline using a walker. Only able to walk limited distances with the walker    Time 6    Period Weeks    Status New    Target Date 03/09/20      PT LONG TERM GOAL #2   Title Patient will be able to properly ascend and descend stairs with no reports of pain to be able to ambulate around her house and community.    Baseline needs hands and uses her right leg    Time 6    Period Weeks    Status New    Target Date 03/09/20      PT LONG TERM GOAL #3   Title Patient will demonstrate 90 degrees of hip flexion in order to sit on her soft couch comfortably    Baseline 40 degrees    Time 6    Period Weeks    Status New    Target Date 03/10/20                 Plan - 03/13/20 1043    Clinical Impression Statement Patient focused on gait training in the II bars today. She walks with a narrow base and a trendelenburgh gait. With mnimal cuing she was able to corrct her foot position and with minimal UE supprt her TB gait was significantly better. We will continue working oin this. She was shown how to work on it at home.    Personal Factors and Comorbidities Comorbidity 3+;Time since onset of injury/illness/exacerbation    Comorbidities history of recurrent infection, obesity, depression    Examination-Activity Limitations Stand;Squat;Sit    Stability/Clinical Decision Making Evolving/Moderate complexity    Clinical Decision Making Moderate    Rehab Potential Good    PT Frequency 2x / week    PT Duration 6 weeks    PT Treatment/Interventions ADLs/Self Care Home Management;Cryotherapy;Electrical Stimulation;Ultrasound;Traction;Moist Heat;Iontophoresis 4mg /ml Dexamethasone;Gait training;Stair training;Functional mobility training;Therapeutic activities;Therapeutic exercise;Balance  training;Neuromuscular re-education;Manual techniques;Patient/family education;Passive range of motion;Dry needling;Joint Manipulations;Spinal Manipulations;Vasopneumatic Device;Taping    PT Next Visit Plan continue hip ROM stretching, hip and quad strengthening; gait training with  cane    PT Home Exercise Plan glute sets, quad sets, heel slides; marching, heel raises, stand lateral hip isometric holds    Consulted and Agree with Plan of Care Patient           Patient will benefit from skilled therapeutic intervention in order to improve the following deficits and impairments:  Abnormal gait, Decreased range of motion, Difficulty walking, Decreased mobility, Decreased strength, Decreased activity tolerance, Decreased knowledge of precautions  Visit Diagnosis: Stiffness of left hip, not elsewhere classified  Difficulty in walking, not elsewhere classified  Muscle weakness (generalized)     Problem List Patient Active Problem List   Diagnosis Date Noted  . Worsening headaches 01/21/2020  . Pruritus 10/30/2019  . Hypercalcemia 10/24/2019  . Body mass index 45.0-49.9, adult ([place]) 08/27/2019  . Intertrigo 07/09/2019  . Facial rash 07/27/2018  . Body mass index 40.0-44.9, adult ([place]) 02/15/2018  . Fatigue associated with anemia 02/07/2018  . Unspecified open wound, left hip, initial encounter 01/19/2018  . Wound of left leg, sequela 10/03/2016  . 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 Status post left hip replacement 06/01/2016  . Sinus tachycardia 05/19/2016  . History of adenomatous polyp of colon 03/21/2016  .  Diabetes ([place]) 01/21/2016  . Thyroid nodule 12/25/2014  . Infiltrate noted on imaging study   . Cervical spine arthritis 11/14/2014  . Primary osteoarthritis of left hip 11/14/2014  . Loss of consciousness ([place]) 11/14/2014  . Chest pain 03/26/2014  . Musculoskeletal chest and rib pain 03/26/2014  . Rib pain on right side 02/28/2014  . Groin pain  04/18/2013  . UNSPECIFIED RENAL SCLEROSIS 01/16/2009  . GERD, SEVERE 05/25/2007  . Morbid obesity ([place]) 04/03/2007  . Dyslipidemia 08/31/2006  . Major depression, recurrent ([place]) 08/31/2006  . 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 Former smoker 08/31/2006  . HYPERTENSION, BENIGN SYSTEMIC 08/31/2006  . GASTRIC ULCER ACUTE WITHOUT HEMORRHAGE 08/31/2006  . CONVULSIONS, SEIZURES, NOS 08/31/2006    Carney Living PT DPT  03/13/2020, 1:07 PM  [facility] [address] [place], [place], [address] Phone: [phone]   Fax:  [phone]  Name: Kristin [name] MRN: [id number] Date of Birth: [birth year]

## 2020-03-18 DIAGNOSIS — F331 Major depressive disorder, recurrent, moderate: Secondary | ICD-10-CM | POA: Diagnosis not present

## 2020-03-19 ENCOUNTER — Other Ambulatory Visit: Payer: Self-pay

## 2020-03-19 ENCOUNTER — Ambulatory Visit: Payer: Medicaid Other

## 2020-03-19 DIAGNOSIS — M6281 Muscle weakness (generalized): Secondary | ICD-10-CM

## 2020-03-19 DIAGNOSIS — R262 Difficulty in walking, not elsewhere classified: Secondary | ICD-10-CM | POA: Diagnosis not present

## 2020-03-19 DIAGNOSIS — M25652 Stiffness of left hip, not elsewhere classified: Secondary | ICD-10-CM | POA: Diagnosis not present

## 2020-03-19 NOTE — Therapy (Signed)
[place], [place], [address] Phone: [phone]   Fax:  [phone]  Physical Therapy Treatment  Patient Details  Name: Kristin [name] MRN: [id number] Date of Birth: Nov 03, 1955 Referring Provider (PT): Dr. Leandrew Koyanagi   Encounter Date: 03/19/2020   PT End of Session - 03/19/20 1216    Visit Number 12    Number of Visits 18    Date for PT Re-Evaluation 04/17/20    Authorization Type MCD    Authorization Time Period 8/23/ to 04/05/20    Authorization - Visit Number 7    Authorization - Number of Visits 12    PT Start Time 1275    PT Stop Time 1255    PT Time Calculation (min) 40 min    Activity Tolerance Patient tolerated treatment well    Behavior During Therapy [facility] for tasks assessed/performed           Past Medical History:  Diagnosis Date  . Allergy   . Anemia   . Anxiety   . Arthritis   . Back pain, chronic   . Borderline diabetes   . Bronchitis   . Constipation   . Cough   . Depression   . Epilepsy ([place])   . GERD (gastroesophageal reflux disease)   . Hyperlipidemia   . Hypertension   . IBS (irritable bowel syndrome)   . Joint pain   . Obesity   . Palpitations   . Pneumonia 2016  . Pre-diabetes   . 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 Rheumatoid arthritis ([place])   . Seizures ([place])    last >10 yrs ago- recorded 01/18/18  . Somatization disorder   . Tobacco abuse     Past Surgical History:  Procedure Laterality Date  . ABDOMINAL HYSTERECTOMY    . ANTERIOR HIP REVISION Left 07/25/2016   Procedure: LEFT HIP IRRIGATION AND DEBRIDEMENT, REVISION OF HEAD AND LINER;  Surgeon: Leandrew Koyanagi, MD;  Location: [place];  Service: Orthopedics;  Laterality: Left;  . APPLICATION OF WOUND VAC Left 12/23/2019   Procedure: APPLICATION OF WOUND VAC;  Surgeon: Leandrew Koyanagi, MD;  Location: [place];  Service: Orthopedics;  Laterality: Left;  . BREAST EXCISIONAL BIOPSY Right   .  CHOLECYSTECTOMY    . COLONOSCOPY    . DEBRIDEMENT AND CLOSURE WOUND Left 07/17/2019   Procedure: Excision of left leg wound with ACell placement and primary closure;  Surgeon: Wallace Going, DO;  Location: [place];  Service: Plastics;  Laterality: Left;  60 min  . EXCISIONAL TOTAL HIP ARTHROPLASTY WITH ANTIBIOTIC SPACERS Left 12/23/2019   Procedure: LEFT HIP IRRIGATION AND DEBRIDEMENT LEFT HIP WOUND and poly exchange ;  Surgeon: Leandrew Koyanagi, MD;  Location: [place];  Service: Orthopedics;  Laterality: Left;  . HIP DEBRIDEMENT Left 01/19/2018   Procedure(s) Performed: IRRIGATION AND DEBRIDEMENT LEFT HIP (Left )  . INCISION AND DRAINAGE HIP Left 08/03/2016   Procedure: IRRIGATION AND DEBRIDEMENT LEFT HIP, VAC PLACEMENT;  Surgeon: Leandrew Koyanagi, MD;  Location: [place];  Service: Orthopedics;  Laterality: Left;  . INCISION AND DRAINAGE HIP Left 01/19/2018   Procedure: IRRIGATION AND DEBRIDEMENT LEFT HIP;  Surgeon: Leandrew Koyanagi, MD;  Location: [place];  Service: Orthopedics;  Laterality: Left;  . kidney stent Right 2002  . TONSILLECTOMY Bilateral 12/06/2014   Procedure: TONSILLECTOMY, BILATERAL;  Surgeon: Ruby Cola, MD;  Location: [place];  Service: ENT;  Laterality: Bilateral;  . TONSILLECTOMY    . TOTAL HIP ARTHROPLASTY Left 06/01/2016   Procedure: LEFT TOTAL HIP ARTHROPLASTY ANTERIOR APPROACH;  Surgeon: Leandrew Koyanagi, MD;  Location: [place];  Service: Orthopedics;  Laterality: Left;  . TUBAL LIGATION    . WOUND EXPLORATION Left 01/19/2018   Procedure: WOUND EXPLORATION;  Surgeon: Leandrew Koyanagi, MD;  Location: [place];  Service: Orthopedics;  Laterality: Left;    There were no vitals filed for this visit.   Subjective Assessment - 03/19/20 1218    Subjective 1-2/10 pain today.    Pain Score 2     Pain Location Hip    Pain Orientation Left    Pain Descriptors / Indicators Aching    Pain Type Chronic pain    Pain Onset More than a month ago    Pain Frequency Intermittent     Aggravating Factors  stand /walk    Pain Relieving Factors comes and goes                             [facility] Adult PT Treatment/Exercise - 03/19/20 0001      Ambulation/Gait   Gait Comments worked in II barrs on putting weight on foot and controlling lateral movement Used the bar as a cue with pulling hip mediallyu off bar then lifting RT foot.  Cued for posture. . Mod cuing required to use enough UE strength to control her lateral movement. Hurdle walk 3 hurdles. Difficulty at first but improved with laps;       Knee/Hip Exercises: Stretches   Hip Flexor Stretch Left;5 reps;10 seconds      Knee/Hip Exercises: Aerobic   Nustep L6   6 min LE/UE   Then at end of session L      Knee/Hip Exercises: Standing   Hip Abduction Left;Right;10 reps    Abduction Limitations pushing in to the support bar laterally in bars    Functional Squat Limitations x20 in bars    Other Standing Knee Exercises hold hip flexed  alternating RT /LT  x 10. Able to stand briefly on RT leg but not on LT .     Other Standing Knee Exercises hip extension x 10  RT/LT 2 sec hold i      Knee/Hip Exercises: Seated   Clamshell with Marga Hoots   x20                   PT Short Term Goals - 03/10/20 1226      PT SHORT TERM GOAL #1   Title Patient will increase L hip strength to 5/5 in all directions.    Baseline stil 4-5 on mmt flexion but lifting more weight    Time 3    Period Weeks    Status On-going    Target Date 02/17/20      PT SHORT TERM GOAL #2   Title Patient will improve hip flexion PROM to 60 degrees to decrease stiffness in sitting.    Baseline 45 degrees    Time 3    Period Weeks    Status On-going    Target Date 02/17/20             PT Long Term Goals - 01/27/20 1745      PT LONG TERM GOAL #1   Title Pt will be able to ambulate 1000' with LRAD to get back to walking, per patient goal.  Baseline using a walker. Only able to walk limited distances with the  walker    Time 6    Period Weeks    Status New    Target Date 03/09/20      PT LONG TERM GOAL #2   Title Patient will be able to properly ascend and descend stairs with no reports of pain to be able to ambulate around her house and community.    Baseline needs hands and uses her right leg    Time 6    Period Weeks    Status New    Target Date 03/09/20      PT LONG TERM GOAL #3   Title Patient will demonstrate 90 degrees of hip flexion in order to sit on her soft couch comfortably    Baseline 40 degrees    Time 6    Period Weeks    Status New    Target Date 03/10/20                 Plan - 03/19/20 1217    Clinical Impression Statement She seems to be progressing but weakness of LT hip prevents stability of LT leg without support. considein everything that has happened to her hip she is doing well.    Personal Factors and Comorbidities Comorbidity 3+;Time since onset of injury/illness/exacerbation    Examination-Activity Limitations Stand;Squat;Sit    PT Treatment/Interventions ADLs/Self Care Home Management;Cryotherapy;Electrical Stimulation;Ultrasound;Traction;Moist Heat;Iontophoresis 4mg /ml Dexamethasone;Gait training;Stair training;Functional mobility training;Therapeutic activities;Therapeutic exercise;Balance training;Neuromuscular re-education;Manual techniques;Patient/family education;Passive range of motion;Dry needling;Joint Manipulations;Spinal Manipulations;Vasopneumatic Device;Taping    PT Next Visit Plan continue hip ROM stretching, hip and quad strengthening; gait training with  cane   Note to Dr Erlinda Hong after next visit    PT Home Exercise Plan glute sets, quad sets, heel slides; marching, heel raises, stand lateral hip isometric holds    Consulted and Agree with Plan of Care Patient           Patient will benefit from skilled therapeutic intervention in order to improve the following deficits and impairments:  Abnormal gait, Decreased range of motion, Difficulty  walking, Decreased mobility, Decreased strength, Decreased activity tolerance, Decreased knowledge of precautions  Visit Diagnosis: Muscle weakness (generalized)  Difficulty in walking, not elsewhere classified  Stiffness of left hip, not elsewhere classified     Problem List Patient Active Problem List   Diagnosis Date Noted  . Worsening headaches 01/21/2020  . Pruritus 10/30/2019  . Hypercalcemia 10/24/2019  . Body mass index 45.0-49.9, adult ([place]) 08/27/2019  . Intertrigo 07/09/2019  . Facial rash 07/27/2018  . Body mass index 40.0-44.9, adult ([place]) 02/15/2018  . Fatigue associated with anemia 02/07/2018  . Unspecified open wound, left hip, initial encounter 01/19/2018  . Wound of left leg, sequela 10/03/2016  . 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 Status post left hip replacement 06/01/2016  . Sinus tachycardia 05/19/2016  . History of adenomatous polyp of colon 03/21/2016  . Diabetes ([place]) 01/21/2016  . Thyroid nodule 12/25/2014  . Infiltrate noted on imaging study   . Cervical spine arthritis 11/14/2014  . Primary osteoarthritis of left hip 11/14/2014  . Loss of consciousness ([place]) 11/14/2014  . Chest pain 03/26/2014  . Musculoskeletal chest and rib pain 03/26/2014  . Rib pain on right side 02/28/2014  . Groin pain 04/18/2013  . UNSPECIFIED RENAL SCLEROSIS 01/16/2009  . GERD, SEVERE 05/25/2007  . Morbid obesity ([place]) 04/03/2007  . Dyslipidemia 08/31/2006  . Major depression, recurrent ([place]) 08/31/2006  .  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 Former smoker 08/31/2006  . HYPERTENSION, BENIGN SYSTEMIC 08/31/2006  . GASTRIC ULCER ACUTE WITHOUT HEMORRHAGE 08/31/2006  . CONVULSIONS, SEIZURES, NOS 08/31/2006    Darrel Hoover  PT 03/19/2020, 1:12 PM  [facility] [address] [place], [place], [address] Phone: [phone]   Fax:  [phone]  Name: ALTHA SWEITZER MRN: [id number] Date  of Birth: 31-Mar-1956

## 2020-03-24 ENCOUNTER — Ambulatory Visit: Payer: Medicaid Other

## 2020-03-24 ENCOUNTER — Other Ambulatory Visit: Payer: Self-pay

## 2020-03-24 DIAGNOSIS — M6281 Muscle weakness (generalized): Secondary | ICD-10-CM | POA: Diagnosis not present

## 2020-03-24 DIAGNOSIS — R262 Difficulty in walking, not elsewhere classified: Secondary | ICD-10-CM

## 2020-03-24 DIAGNOSIS — M25652 Stiffness of left hip, not elsewhere classified: Secondary | ICD-10-CM | POA: Diagnosis not present

## 2020-03-24 NOTE — Therapy (Signed)
[place], [place], [address] Phone: [phone]   Fax:  [phone]  Physical Therapy Treatment  Patient Details  Name: Kristin [name] MRN: [id number] Date of Birth: 07/14/1955 Referring Provider (PT): Dr. Leandrew Koyanagi   Encounter Date: 03/24/2020   PT End of Session - 03/24/20 1209    Visit Number 13    Number of Visits 18    Date for PT Re-Evaluation 04/17/20    Authorization Type MCD    Authorization Time Period 8/23/ to 04/05/20    Authorization - Visit Number 8    Authorization - Number of Visits 12    Progress Note Due on Visit 0.01    PT Start Time 1210    PT Stop Time 1253    PT Time Calculation (min) 43 min    Activity Tolerance Patient tolerated treatment well    Behavior During Therapy [facility] for tasks assessed/performed           Past Medical History:  Diagnosis Date  . Allergy   . Anemia   . Anxiety   . Arthritis   . Back pain, chronic   . Borderline diabetes   . Bronchitis   . Constipation   . Cough   . Depression   . Epilepsy ([place])   . GERD (gastroesophageal reflux disease)   . Hyperlipidemia   . Hypertension   . IBS (irritable bowel syndrome)   . Joint pain   . Obesity   . Palpitations   . Pneumonia 2016  . Pre-diabetes   . 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 Rheumatoid arthritis ([place])   . Seizures ([place])    last >10 yrs ago- recorded 01/18/18  . Somatization disorder   . Tobacco abuse     Past Surgical History:  Procedure Laterality Date  . ABDOMINAL HYSTERECTOMY    . ANTERIOR HIP REVISION Left 07/25/2016   Procedure: LEFT HIP IRRIGATION AND DEBRIDEMENT, REVISION OF HEAD AND LINER;  Surgeon: Leandrew Koyanagi, MD;  Location: [place];  Service: Orthopedics;  Laterality: Left;  . APPLICATION OF WOUND VAC Left 12/23/2019   Procedure: APPLICATION OF WOUND VAC;  Surgeon: Leandrew Koyanagi, MD;  Location: [place];  Service: Orthopedics;  Laterality: Left;  .  BREAST EXCISIONAL BIOPSY Right   . CHOLECYSTECTOMY    . COLONOSCOPY    . DEBRIDEMENT AND CLOSURE WOUND Left 07/17/2019   Procedure: Excision of left leg wound with ACell placement and primary closure;  Surgeon: Wallace Going, DO;  Location: [place];  Service: Plastics;  Laterality: Left;  60 min  . EXCISIONAL TOTAL HIP ARTHROPLASTY WITH ANTIBIOTIC SPACERS Left 12/23/2019   Procedure: LEFT HIP IRRIGATION AND DEBRIDEMENT LEFT HIP WOUND and poly exchange ;  Surgeon: Leandrew Koyanagi, MD;  Location: [place];  Service: Orthopedics;  Laterality: Left;  . HIP DEBRIDEMENT Left 01/19/2018   Procedure(s) Performed: IRRIGATION AND DEBRIDEMENT LEFT HIP (Left )  . INCISION AND DRAINAGE HIP Left 08/03/2016   Procedure: IRRIGATION AND DEBRIDEMENT LEFT HIP, VAC PLACEMENT;  Surgeon: Leandrew Koyanagi, MD;  Location: [place];  Service: Orthopedics;  Laterality: Left;  . INCISION AND DRAINAGE HIP Left 01/19/2018   Procedure: IRRIGATION AND DEBRIDEMENT LEFT HIP;  Surgeon: Leandrew Koyanagi, MD;  Location: [place];  Service: Orthopedics;  Laterality: Left;  . kidney stent Right 2002  . TONSILLECTOMY Bilateral 12/06/2014   Procedure: TONSILLECTOMY, BILATERAL;  Surgeon: Ruby Cola,  MD;  Location: [place];  Service: ENT;  Laterality: Bilateral;  . TONSILLECTOMY    . TOTAL HIP ARTHROPLASTY Left 06/01/2016   Procedure: LEFT TOTAL HIP ARTHROPLASTY ANTERIOR APPROACH;  Surgeon: Leandrew Koyanagi, MD;  Location: [place];  Service: Orthopedics;  Laterality: Left;  . TUBAL LIGATION    . WOUND EXPLORATION Left 01/19/2018   Procedure: WOUND EXPLORATION;  Surgeon: Leandrew Koyanagi, MD;  Location: [place];  Service: Orthopedics;  Laterality: Left;    There were no vitals filed for this visit.   Subjective Assessment - 03/24/20 1323    Subjective 2/10 a little sore from birhtday party this weekend. sat on some hard chairs and walked stars more than I am used to. ( sugested it may have been the hard surfaces for a couple of hours)     Pain Score 2     Pain Location Hip    Pain Orientation Left                             [facility] Adult PT Treatment/Exercise - 03/24/20 0001      Knee/Hip Exercises: Aerobic   Nustep L4   6 min LE      Knee/Hip Exercises: Standing   Heel Raises Limitations 25    Hip Abduction Right;Left;15 reps;Knee straight    Abduction Limitations red band    Hip Extension Right;Left;15 reps    Extension Limitations red band    Forward Step Up Right;Left;15 reps;Hand Hold: 2;Step Height: 6"    Step Down Right;Left;15 reps;Hand Hold: 2;Step Height: 4"    Functional Squat Limitations x20 in bars    Other Standing Knee Exercises  hip flexed  alternating RT /LT  x 15 red band.       Knee/Hip Exercises: Supine   Bridges with Clamshell Both;20 reps   green band                 PT Education - 03/24/20 1311    Education Details Added standing band hip strengthening    Person(s) Educated Patient    Methods Explanation;Demonstration;Verbal cues;Handout    Comprehension Verbalized understanding;Returned demonstration            PT Short Term Goals - 03/24/20 1313      PT SHORT TERM GOAL #1   Title Patient will increase L hip strength to 5/5 in all directions.    Baseline stil 4-5 on mmt flexion but lifting more weight     Not able to support body weight with single leg stand on LT    Status On-going      PT SHORT TERM GOAL #2   Title Patient will improve hip flexion PROM to 60 degrees to decrease stiffness in sitting.    Baseline 50    Status On-going      PT SHORT TERM GOAL #3   Title Patient will transfer sit to stand without the use of her hands    Baseline ABLE WITHOUT HANDS    Status Achieved      PT SHORT TERM GOAL #4   Title Patient will independently state hip percautions    Baseline sHE UNDERSTOOD  TO NOT CROSS LEGS AND BEND OVER PAST 90 DEGREES,    Status Achieved             PT Long Term Goals - 03/24/20 1314      PT LONG TERM GOAL #1   Title  Pt will be able to ambulate 1000' with LRAD to get back to walking, per patient goal.    Status On-going      PT LONG TERM GOAL #2   Title Patient will be able to properly ascend and descend stairs with no reports of pain to be able to ambulate around her house and community.    Baseline needs hands and uses her right leg    Status On-going      PT LONG TERM GOAL #4   Title She will be able to sit for 45-60 min without incr pain    Baseline 30-45 and need to stand    Status On-going                 Plan - 03/24/20 1210    Clinical Impression Statement Progressing but weakness makes walking without support incr risk of fall. Considering all that has gone on she is doing well with minimal pain  and limited strenght but improveing.   unclear if she will incr strength enough to walk with no device safely.    PT Treatment/Interventions ADLs/Self Care Home Management;Cryotherapy;Electrical Stimulation;Ultrasound;Traction;Moist Heat;Iontophoresis 4mg /ml Dexamethasone;Gait training;Stair training;Functional mobility training;Therapeutic activities;Therapeutic exercise;Balance training;Neuromuscular re-education;Manual techniques;Patient/family education;Passive range of motion;Dry needling;Joint Manipulations;Spinal Manipulations;Vasopneumatic Device;Taping    PT Next Visit Plan continue hip ROM stretching, hip and quad strengthening; gait training with  cane   MMT and ROM measure Lt hip, walk as far as able   Recert on 7/34/19    PT Home Exercise Plan glute sets, quad sets, heel slides; marching, heel raises, stand lateral hip isometric holds, Single leg bridge, clam, gluteal set with knee ext in hooklye    Consulted and Agree with Plan of Care Patient           Patient will benefit from skilled therapeutic intervention in order to improve the following deficits and impairments:  Abnormal gait, Decreased range of motion, Difficulty walking, Decreased mobility, Decreased strength, Decreased  activity tolerance, Decreased knowledge of precautions  Visit Diagnosis: Muscle weakness (generalized)  Difficulty in walking, not elsewhere classified  Stiffness of left hip, not elsewhere classified     Problem List Patient Active Problem List   Diagnosis Date Noted  . Worsening headaches 01/21/2020  . Pruritus 10/30/2019  . Hypercalcemia 10/24/2019  . Body mass index 45.0-49.9, adult ([place]) 08/27/2019  . Intertrigo 07/09/2019  . Facial rash 07/27/2018  . Body mass index 40.0-44.9, adult ([place]) 02/15/2018  . Fatigue associated with anemia 02/07/2018  . Unspecified open wound, left hip, initial encounter 01/19/2018  . Wound of left leg, sequela 10/03/2016  . 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 Status post left hip replacement 06/01/2016  . Sinus tachycardia 05/19/2016  . History of adenomatous polyp of colon 03/21/2016  . Diabetes ([place]) 01/21/2016  . Thyroid nodule 12/25/2014  . Infiltrate noted on imaging study   . Cervical spine arthritis 11/14/2014  . Primary osteoarthritis of left hip 11/14/2014  . Loss of consciousness ([place]) 11/14/2014  . Chest pain 03/26/2014  . Musculoskeletal chest and rib pain 03/26/2014  . Rib pain on right side 02/28/2014  . Groin pain 04/18/2013  . UNSPECIFIED RENAL SCLEROSIS 01/16/2009  . GERD, SEVERE 05/25/2007  . Morbid obesity ([place]) 04/03/2007  . Dyslipidemia 08/31/2006  . Major depression, recurrent ([place]) 08/31/2006  . 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 Former smoker 08/31/2006  . HYPERTENSION, BENIGN SYSTEMIC 08/31/2006  . GASTRIC ULCER ACUTE WITHOUT HEMORRHAGE 08/31/2006  . CONVULSIONS, SEIZURES, NOS 08/31/2006  Darrel Hoover  PT 03/24/2020, 1:25 PM  [facility] [address] [place], [place], [address] Phone: [phone]   Fax:  [phone]  Name: Kristin [name] MRN: [id number] Date of Birth: 04-10-1956

## 2020-03-24 NOTE — Patient Instructions (Signed)
Standing band hip flex/ext/abduct 10 reps 1-2 sets 1-2x/day hold 1-5 sec Single leg bridge x 10 RT/Lt 1-2x/day Gluteal set with clam x 10 1-2 sets 1-2x/day hold 1-5 sec

## 2020-03-25 ENCOUNTER — Ambulatory Visit (INDEPENDENT_AMBULATORY_CARE_PROVIDER_SITE_OTHER): Payer: Medicaid Other | Admitting: Orthopaedic Surgery

## 2020-03-25 ENCOUNTER — Ambulatory Visit: Payer: Self-pay

## 2020-03-25 ENCOUNTER — Encounter: Payer: Self-pay | Admitting: Orthopaedic Surgery

## 2020-03-25 VITALS — Ht 62.0 in | Wt 236.0 lb

## 2020-03-25 DIAGNOSIS — T8452XD Infection and inflammatory reaction due to internal left hip prosthesis, subsequent encounter: Secondary | ICD-10-CM | POA: Diagnosis not present

## 2020-03-25 MED ORDER — AMOXICILLIN 500 MG PO CAPS: ORAL_CAPSULE | ORAL | 0 refills | Status: DC

## 2020-03-25 NOTE — Progress Notes (Signed)
Post-Op Visit Note   Patient: Kristin [name]           Date of Birth: 04/26/1956           MRN: [id number] Visit Date: 03/25/2020 PCP: Katha Cabal, DO   Assessment & Plan:  Chief Complaint:  Chief Complaint  Patient presents with  . Left Hip - Follow-up   Visit Diagnoses:  1. 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  She has been doing well.  She still notes some swelling and stinging to the left lateral hip but nothing more.  No active drainage.  She is still in physical therapy making slow but steady progress.  She continues to work on home exercise program as well.  Examination of her left hip reveals a fully healed surgical scar without complication.  She has mild swelling.  She has slightly limited range of motion and strength to the left hip.  She is neurovascular intact distally.  At this point, she will continue with physical therapy in an outpatient setting as well as continue to work on her home exercise program.  Dental prophylaxis reinforced.  Follow-up with [place] in 3 months time for repeat evaluation and AP pelvis x-rays.  Follow-Up Instructions: Return in about 3 months (around 06/24/2020).   Orders:  Orders Placed This Encounter  Procedures  . XR Pelvis 1-2 Views   No orders of the defined types were placed in this encounter.   Imaging: No new imaging  PMFS History: Patient Active Problem List   Diagnosis Date Noted  . Worsening headaches 01/21/2020  . Pruritus 10/30/2019  . Hypercalcemia 10/24/2019  . Body mass index 45.0-49.9, adult ([facility]) 08/27/2019  . Intertrigo 07/09/2019  . Facial rash 07/27/2018  . Body mass index 40.0-44.9, adult ([facility]) 02/15/2018  . Fatigue associated with anemia 02/07/2018  . Unspecified open wound, left hip, initial encounter 01/19/2018  . Wound of left leg,  sequela 10/03/2016  . 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[facility]) 08/24/2016  . Status post left hip replacement 06/01/2016  . Sinus tachycardia 05/19/2016  . History of adenomatous polyp of colon 03/21/2016  . Diabetes ([facility]) 01/21/2016  . Thyroid nodule 12/25/2014  . Infiltrate noted on imaging study   . Cervical spine arthritis 11/14/2014  . Primary osteoarthritis of left hip 11/14/2014  . Loss of consciousness ([facility]) 11/14/2014  . Chest pain 03/26/2014  . Musculoskeletal chest and rib pain 03/26/2014  . Rib pain on right side 02/28/2014  . Groin pain 04/18/2013  . UNSPECIFIED RENAL SCLEROSIS 01/16/2009  . GERD, SEVERE 05/25/2007  . Morbid obesity ([facility]) 04/03/2007  . Dyslipidemia 08/31/2006  . Major depression, recurrent ([facility]) 08/31/2006  . 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 Former smoker 08/31/2006  . HYPERTENSION, BENIGN SYSTEMIC 08/31/2006  . GASTRIC ULCER ACUTE WITHOUT HEMORRHAGE 08/31/2006  . CONVULSIONS, SEIZURES, NOS 08/31/2006   Past Medical History:  Diagnosis Date  . Allergy   . Anemia   . Anxiety   . Arthritis   . Back pain, chronic   . Borderline diabetes   . Bronchitis   . Constipation   . Cough   . Depression   . Epilepsy ([facility])   . GERD (gastroesophageal reflux disease)   . Hyperlipidemia   . Hypertension   . IBS (irritable bowel syndrome)   . Joint pain   . Obesity   .  Palpitations   . Pneumonia 2016  . Pre-diabetes   . 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 Rheumatoid arthritis ([facility])   . Seizures ([facility])    last >10 yrs ago- recorded 01/18/18  . Somatization disorder   . Tobacco abuse     Family History  Problem Relation Age of Onset  . Other Mother        failed surgery  . Heart Problems Father        poss MI, not sure:per pt  . Alzheimer's disease Maternal Grandmother   . Heart disease Maternal Grandfather   . Colon cancer Maternal Uncle        not sure age of onset    Past Surgical History:  Procedure Laterality Date   . ABDOMINAL HYSTERECTOMY    . ANTERIOR HIP REVISION Left 07/25/2016   Procedure: LEFT HIP IRRIGATION AND DEBRIDEMENT, REVISION OF HEAD AND LINER;  Surgeon: Tarry Kos, MD;  [facility];  Service: Orthopedics;  Laterality: Left;  . APPLICATION OF WOUND VAC Left 12/23/2019   Procedure: APPLICATION OF WOUND VAC;  Surgeon: Tarry Kos, MD;  [facility];  Service: Orthopedics;  Laterality: Left;  . BREAST EXCISIONAL BIOPSY Right   . CHOLECYSTECTOMY    . COLONOSCOPY    . DEBRIDEMENT AND CLOSURE WOUND Left 07/17/2019   Procedure: Excision of left leg wound with ACell placement and primary closure;  Surgeon: Peggye Form, DO;  Location: [facility];  Service: Plastics;  Laterality: Left;  60 min  . EXCISIONAL TOTAL HIP ARTHROPLASTY WITH ANTIBIOTIC SPACERS Left 12/23/2019   Procedure: LEFT HIP IRRIGATION AND DEBRIDEMENT LEFT HIP WOUND and poly exchange ;  Surgeon: Tarry Kos, MD;  [facility];  Service: Orthopedics;  Laterality: Left;  . HIP DEBRIDEMENT Left 01/19/2018   Procedure(s) Performed: IRRIGATION AND DEBRIDEMENT LEFT HIP (Left )  . INCISION AND DRAINAGE HIP Left 08/03/2016   Procedure: IRRIGATION AND DEBRIDEMENT LEFT HIP, VAC PLACEMENT;  Surgeon: Tarry Kos, MD;  [facility];  Service: Orthopedics;  Laterality: Left;  . INCISION AND DRAINAGE HIP Left 01/19/2018   Procedure: IRRIGATION AND DEBRIDEMENT LEFT HIP;  Surgeon: Tarry Kos, MD;  [facility];  Service: Orthopedics;  Laterality: Left;  . kidney stent Right 2002  . TONSILLECTOMY Bilateral 12/06/2014   Procedure: TONSILLECTOMY, BILATERAL;  Surgeon: Melvenia Beam, MD;  [facility];  Service: ENT;  Laterality: Bilateral;  . TONSILLECTOMY    . TOTAL HIP ARTHROPLASTY Left 06/01/2016   Procedure: LEFT TOTAL HIP ARTHROPLASTY ANTERIOR APPROACH;  Surgeon: Tarry Kos, MD;  [facility];  Service: Orthopedics;  Laterality: Left;  . TUBAL LIGATION    . WOUND EXPLORATION Left 01/19/2018    Procedure: WOUND EXPLORATION;  Surgeon: Tarry Kos, MD;  Location: [facility];  Service: Orthopedics;  Laterality: Left;   Social History   Occupational History  . Occupation: Not Working  Tobacco Use  . Smoking status: Former Smoker    Packs/day: 0.25    Years: 0.50    Pack years: 0.12    Types: Cigarettes    Quit date: 01/27/2014    Years since quitting: 6.1  . Smokeless tobacco: Never Used  Vaping Use  . Vaping Use: Never used  Substance and Sexual Activity  . Alcohol use: No  . Drug use: No  . Sexual activity: Not on file

## 2020-03-26 ENCOUNTER — Ambulatory Visit: Payer: Medicaid Other

## 2020-03-26 ENCOUNTER — Other Ambulatory Visit: Payer: Self-pay

## 2020-03-26 DIAGNOSIS — M25652 Stiffness of left hip, not elsewhere classified: Secondary | ICD-10-CM | POA: Diagnosis not present

## 2020-03-26 DIAGNOSIS — M6281 Muscle weakness (generalized): Secondary | ICD-10-CM | POA: Diagnosis not present

## 2020-03-26 DIAGNOSIS — R262 Difficulty in walking, not elsewhere classified: Secondary | ICD-10-CM | POA: Diagnosis not present

## 2020-03-26 MED ORDER — HYDROCHLOROTHIAZIDE 25 MG PO TABS: 25.0000 mg | ORAL_TABLET | Freq: Every day | ORAL | 0 refills | Status: DC

## 2020-03-26 NOTE — Therapy (Signed)
[place], [place], [address] Phone: [phone]   Fax:  [phone]  Physical Therapy Treatment  Patient Details  Name: Kristin [name] MRN: [id number] Date of Birth: 03/31/1956 Referring Provider (PT): Dr. Leandrew Koyanagi   Encounter Date: 03/26/2020   PT End of Session - 03/26/20 1217    Visit Number 14    Number of Visits 18    Date for PT Re-Evaluation 04/17/20    Authorization Type MCD    Authorization Time Period 8/23/ to 04/05/20    Authorization - Visit Number 9    Authorization - Number of Visits 12    PT Start Time 1194    PT Stop Time 1253    PT Time Calculation (min) 38 min    Activity Tolerance Patient tolerated treatment well;Patient limited by fatigue    Behavior During Therapy [facility] for tasks assessed/performed           Past Medical History:  Diagnosis Date  . Allergy   . Anemia   . Anxiety   . Arthritis   . Back pain, chronic   . Borderline diabetes   . Bronchitis   . Constipation   . Cough   . Depression   . Epilepsy ([place])   . GERD (gastroesophageal reflux disease)   . Hyperlipidemia   . Hypertension   . IBS (irritable bowel syndrome)   . Joint pain   . Obesity   . Palpitations   . Pneumonia 2016  . Pre-diabetes   . 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 Rheumatoid arthritis ([place])   . Seizures ([place])    last >10 yrs ago- recorded 01/18/18  . Somatization disorder   . Tobacco abuse     Past Surgical History:  Procedure Laterality Date  . ABDOMINAL HYSTERECTOMY    . ANTERIOR HIP REVISION Left 07/25/2016   Procedure: LEFT HIP IRRIGATION AND DEBRIDEMENT, REVISION OF HEAD AND LINER;  Surgeon: Leandrew Koyanagi, MD;  Location: [place];  Service: Orthopedics;  Laterality: Left;  . APPLICATION OF WOUND VAC Left 12/23/2019   Procedure: APPLICATION OF WOUND VAC;  Surgeon: Leandrew Koyanagi, MD;  Location: [place];  Service: Orthopedics;  Laterality: Left;  . BREAST  EXCISIONAL BIOPSY Right   . CHOLECYSTECTOMY    . COLONOSCOPY    . DEBRIDEMENT AND CLOSURE WOUND Left 07/17/2019   Procedure: Excision of left leg wound with ACell placement and primary closure;  Surgeon: Wallace Going, DO;  Location: [place];  Service: Plastics;  Laterality: Left;  60 min  . EXCISIONAL TOTAL HIP ARTHROPLASTY WITH ANTIBIOTIC SPACERS Left 12/23/2019   Procedure: LEFT HIP IRRIGATION AND DEBRIDEMENT LEFT HIP WOUND and poly exchange ;  Surgeon: Leandrew Koyanagi, MD;  Location: [place];  Service: Orthopedics;  Laterality: Left;  . HIP DEBRIDEMENT Left 01/19/2018   Procedure(s) Performed: IRRIGATION AND DEBRIDEMENT LEFT HIP (Left )  . INCISION AND DRAINAGE HIP Left 08/03/2016   Procedure: IRRIGATION AND DEBRIDEMENT LEFT HIP, VAC PLACEMENT;  Surgeon: Leandrew Koyanagi, MD;  Location: [place];  Service: Orthopedics;  Laterality: Left;  . INCISION AND DRAINAGE HIP Left 01/19/2018   Procedure: IRRIGATION AND DEBRIDEMENT LEFT HIP;  Surgeon: Leandrew Koyanagi, MD;  Location: [place];  Service: Orthopedics;  Laterality: Left;  . kidney stent Right 2002  . TONSILLECTOMY Bilateral 12/06/2014   Procedure: TONSILLECTOMY, BILATERAL;  Surgeon: Ruby Cola, MD;  Location: [place];  Service: ENT;  Laterality: Bilateral;  . TONSILLECTOMY    . TOTAL HIP ARTHROPLASTY Left 06/01/2016   Procedure: LEFT TOTAL HIP ARTHROPLASTY ANTERIOR APPROACH;  Surgeon: Leandrew Koyanagi, MD;  Location: [place];  Service: Orthopedics;  Laterality: Left;  . TUBAL LIGATION    . WOUND EXPLORATION Left 01/19/2018   Procedure: WOUND EXPLORATION;  Surgeon: Leandrew Koyanagi, MD;  Location: [place];  Service: Orthopedics;  Laterality: Left;    There were no vitals filed for this visit.   Subjective Assessment - 03/26/20 1238    Subjective She feels good  She is now able with cane to complete community activity such as shopping with cane or store cart walking. Multiple stairs are difficult but this has improved. Will use the final 4  visits to work on endurance with stairs and walking                             [facility] Adult PT Treatment/Exercise - 03/26/20 0001      Ambulation/Gait   Ambulation Distance (Feet) 600 Feet   then 300 feet at end  with fatigue stopping   Assistive device Straight cane    Gait Pattern Step-through pattern    Ambulation Surface Level;Indoor    Stairs Yes    Stair Management Technique Two rails;Alternating pattern    Number of Stairs 12    Height of Stairs 6    Gait Comments decreased LT hip extension. decr weight to L       Knee/Hip Exercises: Standing   Forward Step Up Right;Left;15 reps;Hand Hold: 2;Step Height: 6"      Knee/Hip Exercises: Seated   Sit to Sand 2 sets;10 reps;without UE support          Nustep L5 6 min LE and UE          PT Short Term Goals - 03/24/20 1313      PT SHORT TERM GOAL #1   Title Patient will increase L hip strength to 5/5 in all directions.    Baseline stil 4-5 on mmt flexion but lifting more weight     Not able to support body weight with single leg stand on LT    Status On-going      PT SHORT TERM GOAL #2   Title Patient will improve hip flexion PROM to 60 degrees to decrease stiffness in sitting.    Baseline 50    Status On-going      PT SHORT TERM GOAL #3   Title Patient will transfer sit to stand without the use of her hands    Baseline ABLE WITHOUT HANDS    Status Achieved      PT SHORT TERM GOAL #4   Title Patient will independently state hip percautions    Baseline sHE UNDERSTOOD  TO NOT CROSS LEGS AND BEND OVER PAST 90 DEGREES,    Status Achieved             PT Long Term Goals - 03/26/20 1231      PT LONG TERM GOAL #1   Title Pt will be able to ambulate 1000' with LRAD to get back to walking, per patient goal.    Baseline 600 feet SPC reported fatigue so stoped    Status Partially Met      PT LONG TERM GOAL #2   Title Patient will be able to properly ascend and descend stairs with no reports of  pain  to be able to ambulate around her house and community.    Baseline Need shands fo stabilityu and light assist but step over step and good stability    Status Achieved      PT LONG TERM GOAL #3   Title Patient will demonstrate 90 degrees of hip flexion in order to sit on her soft couch comfortably    Status Achieved      PT LONG TERM GOAL #4   Title She will be able to sit for 45-60 min without incr pain    Baseline 2 hours and uses extra cushion to elevate seat    Status Achieved      PT LONG TERM GOAL #5   Title She will be able to walk 16 stairs 6 inch height one UE assist safely step over step    Baseline 12 steps but need 2 arms for safety    Time 2    Period Weeks    Status New                 Plan - 03/26/20 1218    Clinical Impression Statement She has met most of LTG but walking distance limited and she reports easy fatigue on stairs.   We will address endurance in final sessions    PT Treatment/Interventions ADLs/Self Care Home Management;Cryotherapy;Electrical Stimulation;Ultrasound;Traction;Moist Heat;Iontophoresis 55m/ml Dexamethasone;Gait training;Stair training;Functional mobility training;Therapeutic activities;Therapeutic exercise;Balance training;Neuromuscular re-education;Manual techniques;Patient/family education;Passive range of motion;Dry needling;Joint Manipulations;Spinal Manipulations;Vasopneumatic Device;Taping    PT Next Visit Plan continue hip ROM stretching, hip and quad strengthening; gait training with  cane                          MMT and ROM measure Lt hip,                        Recert on 93/53/61   PT Home Exercise Plan glute sets, quad sets, heel slides; marching, heel raises, stand lateral hip isometric holds, Single leg bridge, clam, gluteal set with knee ext in hooklye    Consulted and Agree with Plan of Care Patient           Patient will benefit from skilled therapeutic intervention in order to improve the following deficits and  impairments:  Abnormal gait, Decreased range of motion, Difficulty walking, Decreased mobility, Decreased strength, Decreased activity tolerance, Decreased knowledge of precautions  Visit Diagnosis: Muscle weakness (generalized)  Stiffness of left hip, not elsewhere classified  Difficulty in walking, not elsewhere classified     Problem List Patient Active Problem List   Diagnosis Date Noted  . Worsening headaches 01/21/2020  . Pruritus 10/30/2019  . Hypercalcemia 10/24/2019  . Body mass index 45.0-49.9, adult ([facility] 08/27/2019  . Intertrigo 07/09/2019  . Facial rash 07/27/2018  . Body mass index 40.0-44.9, adult ([facility] 02/15/2018  . Fatigue associated with anemia 02/07/2018  . Unspecified open wound, left hip, initial encounter 01/19/2018  . Wound of left leg, sequela 10/03/2016  . 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[facility] 08/24/2016  . Status post left hip replacement 06/01/2016  . Sinus tachycardia 05/19/2016  . History of adenomatous polyp of colon 03/21/2016  . Diabetes ([facility] 01/21/2016  . Thyroid nodule 12/25/2014  . Infiltrate noted on imaging study   . Cervical spine arthritis 11/14/2014  . Primary osteoarthritis of left hip 11/14/2014  . Loss of consciousness ([facility] 11/14/2014  . Chest pain 03/26/2014  . Musculoskeletal chest and  rib pain 03/26/2014  . Rib pain on right side 02/28/2014  . Groin pain 04/18/2013  . UNSPECIFIED RENAL SCLEROSIS 01/16/2009  . GERD, SEVERE 05/25/2007  . Morbid obesity ([place]) 04/03/2007  . Dyslipidemia 08/31/2006  . Major depression, recurrent ([place]) 08/31/2006  . 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 Former smoker 08/31/2006  . HYPERTENSION, BENIGN SYSTEMIC 08/31/2006  . GASTRIC ULCER ACUTE WITHOUT HEMORRHAGE 08/31/2006  . CONVULSIONS, SEIZURES, NOS 08/31/2006    Darrel Hoover  PT 03/26/2020, 1:24 PM  [facility] [address] [place], [place], [address] Phone: [phone]    Fax:  [phone]  Name: SAHILY BIDDLE MRN: [id number] Date of Birth: 1955-07-25

## 2020-03-31 ENCOUNTER — Ambulatory Visit: Payer: Medicaid Other

## 2020-04-02 ENCOUNTER — Ambulatory Visit: Payer: Medicaid Other

## 2020-04-02 ENCOUNTER — Other Ambulatory Visit: Payer: Self-pay

## 2020-04-02 DIAGNOSIS — M6281 Muscle weakness (generalized): Secondary | ICD-10-CM | POA: Diagnosis not present

## 2020-04-02 DIAGNOSIS — M25652 Stiffness of left hip, not elsewhere classified: Secondary | ICD-10-CM

## 2020-04-02 DIAGNOSIS — R262 Difficulty in walking, not elsewhere classified: Secondary | ICD-10-CM

## 2020-04-02 NOTE — Therapy (Addendum)
[place], [place], [address] Phone: [phone]   Fax:  [phone]  Physical Therapy Treatment/Discharge  Patient Details  Name: Kristin [name] MRN: [id number] Date of Birth: December 24, 1955 Referring Provider (PT): Dr. Leandrew Koyanagi   Encounter Date: 04/02/2020   PT End of Session - 04/02/20 1044    Visit Number 15    Number of Visits 18    Date for PT Re-Evaluation 04/17/20    Authorization Type MCD    Authorization Time Period 8/23/ to 04/05/20    Authorization - Visit Number 10    Authorization - Number of Visits 12    PT Start Time 3716    PT Stop Time 1125    PT Time Calculation (min) 40 min    Activity Tolerance Patient tolerated treatment well;Patient limited by fatigue           Past Medical History:  Diagnosis Date  . Allergy   . Anemia   . Anxiety   . Arthritis   . Back pain, chronic   . Borderline diabetes   . Bronchitis   . Constipation   . Cough   . Depression   . Epilepsy ([place])   . GERD (gastroesophageal reflux disease)   . Hyperlipidemia   . Hypertension   . IBS (irritable bowel syndrome)   . Joint pain   . Obesity   . Palpitations   . Pneumonia 2016  . Pre-diabetes   . 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 Rheumatoid arthritis ([place])   . Seizures ([place])    last >10 yrs ago- recorded 01/18/18  . Somatization disorder   . Tobacco abuse     Past Surgical History:  Procedure Laterality Date  . ABDOMINAL HYSTERECTOMY    . ANTERIOR HIP REVISION Left 07/25/2016   Procedure: LEFT HIP IRRIGATION AND DEBRIDEMENT, REVISION OF HEAD AND LINER;  Surgeon: Leandrew Koyanagi, MD;  Location: [place];  Service: Orthopedics;  Laterality: Left;  . APPLICATION OF WOUND VAC Left 12/23/2019   Procedure: APPLICATION OF WOUND VAC;  Surgeon: Leandrew Koyanagi, MD;  Location: [place];  Service: Orthopedics;  Laterality: Left;  . BREAST EXCISIONAL BIOPSY Right   . CHOLECYSTECTOMY    .  COLONOSCOPY    . DEBRIDEMENT AND CLOSURE WOUND Left 07/17/2019   Procedure: Excision of left leg wound with ACell placement and primary closure;  Surgeon: Wallace Going, DO;  Location: [place];  Service: Plastics;  Laterality: Left;  60 min  . EXCISIONAL TOTAL HIP ARTHROPLASTY WITH ANTIBIOTIC SPACERS Left 12/23/2019   Procedure: LEFT HIP IRRIGATION AND DEBRIDEMENT LEFT HIP WOUND and poly exchange ;  Surgeon: Leandrew Koyanagi, MD;  Location: [place];  Service: Orthopedics;  Laterality: Left;  . HIP DEBRIDEMENT Left 01/19/2018   Procedure(s) Performed: IRRIGATION AND DEBRIDEMENT LEFT HIP (Left )  . INCISION AND DRAINAGE HIP Left 08/03/2016   Procedure: IRRIGATION AND DEBRIDEMENT LEFT HIP, VAC PLACEMENT;  Surgeon: Leandrew Koyanagi, MD;  Location: [place];  Service: Orthopedics;  Laterality: Left;  . INCISION AND DRAINAGE HIP Left 01/19/2018   Procedure: IRRIGATION AND DEBRIDEMENT LEFT HIP;  Surgeon: Leandrew Koyanagi, MD;  Location: [place];  Service: Orthopedics;  Laterality: Left;  . kidney stent Right 2002  . TONSILLECTOMY Bilateral 12/06/2014   Procedure: TONSILLECTOMY, BILATERAL;  Surgeon: Ruby Cola, MD;  Location: [place];  Service: ENT;  Laterality: Bilateral;  . TONSILLECTOMY    .  TOTAL HIP ARTHROPLASTY Left 06/01/2016   Procedure: LEFT TOTAL HIP ARTHROPLASTY ANTERIOR APPROACH;  Surgeon: Leandrew Koyanagi, MD;  Location: [place];  Service: Orthopedics;  Laterality: Left;  . TUBAL LIGATION    . WOUND EXPLORATION Left 01/19/2018   Procedure: WOUND EXPLORATION;  Surgeon: Leandrew Koyanagi, MD;  Location: [place];  Service: Orthopedics;  Laterality: Left;    There were no vitals filed for this visit.   Subjective Assessment - 04/02/20 1351    Subjective She feels good  She is now able with cane to complete community activity such as shopping with cane or store cart walking. Multiple stairs are difficult but this has improved. Will use the final 6 visits to work on endurance/speed with stairs and  walking and hip strength    Pain Score 2     Pain Location Hip    Pain Orientation Left    Pain Descriptors / Indicators Aching    Pain Type Chronic pain    Pain Onset More than a month ago    Pain Frequency Intermittent    Aggravating Factors  activity on feet    Pain Relieving Factors eases with rest              [facility] PT Assessment - 04/02/20 0001      AROM   AROM Assessment Site Hip    Right/Left Hip Left    Left Hip Flexion 81   with RT leg flat on mat,  with RT leg flexed 105 deg   Left Hip External Rotation  12    Left Hip Internal Rotation  45    Left Hip ABduction 20    Left Hip ADduction 12      PROM   Left Hip Flexion 105    Left Hip External Rotation  15    Left Hip Internal Rotation  48    Left Hip ABduction 25    Left Hip ADduction 15      Strength   Left Hip Flexion 4/5    Left Hip Extension 4/5    Left Hip External Rotation 4-/5    Left Hip Internal Rotation 3-/5    Left Hip ABduction 2+/5    Left Hip ADduction 4+/5    Left Knee Flexion 4+/5    Left Knee Extension 5/5      Standardized Balance Assessment   Standardized Balance Assessment Five Times Sit to Stand;Timed Up and Go Test;Berg Balance Test    Five times sit to stand comments  16 sec      Berg Balance Test   Sit to Stand Able to stand without using hands and stabilize independently    Standing Unsupported Able to stand safely 2 minutes    Sitting with Back Unsupported but Feet Supported on Floor or Stool Able to sit safely and securely 2 minutes    Stand to Sit Sits safely with minimal use of hands    Transfers Able to transfer safely, minor use of hands    Standing Unsupported with Eyes Closed Able to stand 10 seconds safely    Standing Unsupported with Feet Together Able to place feet together independently and stand 1 minute safely    From Standing, Reach Forward with Outstretched Arm Can reach confidently >25 cm (10")    From Standing Position, Pick up Object from Floor Able to  pick up shoe safely and easily    From Standing Position, Turn to Look Behind Over each Shoulder Looks behind from  both sides and weight shifts well    Turn 360 Degrees Able to turn 360 degrees safely one side only in 4 seconds or less    Standing Unsupported, Alternately Place Feet on Step/Stool Able to stand independently and safely and complete 8 steps in 20 seconds    Standing Unsupported, One Foot in Front Able to plae foot ahead of the other independently and hold 30 seconds    Standing on One Leg Tries to lift leg/unable to hold 3 seconds but remains standing independently    Total Score 51      Timed Up and Go Test   Normal TUG (seconds) 15                         [facility] Adult PT Treatment/Exercise - 04/02/20 0001      Knee/Hip Exercises: Aerobic   Nustep L4   6 min LE      Knee/Hip Exercises: Seated   Sit to Sand 2 sets;10 reps;without UE support                    PT Short Term Goals - 04/02/20 1345      PT SHORT TERM GOAL #1   Title Patient will increase L hip strength to 5/5 in all directions.    Baseline stil 4-5 on mmt flexion but lifting more weight     Not able to support body weight with single leg stand on LT,  Hip rotation and abduction are below 3/5    Status On-going      PT SHORT TERM GOAL #2   Title Patient will improve hip flexion PROM to 60 degrees to decrease stiffness in sitting.    Status Achieved      PT SHORT TERM GOAL #3   Title Patient will transfer sit to stand without the use of her hands    Baseline ABLE WITHOUT HANDS    Status Achieved      PT SHORT TERM GOAL #4   Title Patient will independently state hip percautions    Baseline sHE UNDERSTOOD  TO NOT CROSS LEGS AND BEND OVER PAST 90 DEGREES,    Status Achieved             PT Long Term Goals - 04/02/20 1346      PT LONG TERM GOAL #1   Title Pt will be able to ambulate 1000' with LRAD to get back to walking, per patient goal.    Baseline 600 feet SPC  reported fatigue so stoped    Status Partially Met      PT LONG TERM GOAL #2   Title Patient will be able to properly ascend and descend stairs with no reports of pain to be able to ambulate around her house and community.    Baseline Need of  hands fo stability and light assist but step over step and good stability    Status Achieved      PT LONG TERM GOAL #3   Title Patient will demonstrate 90 degrees of hip flexion in order to sit on her soft couch comfortably    Status Achieved      PT LONG TERM GOAL #4   Title She will be able to sit for 45-60 min without incr pain    Baseline 2 hours and uses extra cushion to elevate seat    Status Achieved      PT LONG TERM GOAL #5  Title She will be able to walk 16 stairs 6 inch height one UE assist safely step over step    Baseline 12 steps but need 2 arms for safety    Status On-going      Additional Long Term Goals   Additional Long Term Goals Yes      PT LONG TERM GOAL #6   Title She will be able to STS x5 in 13 sec or less    Baseline 16 sec    Time 3    Period Weeks    Status New                 Plan - 04/02/20 1052    Clinical Impression Statement She has some deficiets we should be able to improve  with more skilled PT . She has made progress with strength and AROm and her balance is improved. She still has some functional deficiest with speed and endurance influenced by weakness of LT hip  and skilled PT should help to improve her speed and establish progression of HEP.    PT Frequency 2x / week    PT Duration 3 weeks    PT Treatment/Interventions ADLs/Self Care Home Management;Cryotherapy;Electrical Stimulation;Ultrasound;Traction;Moist Heat;Iontophoresis 73m/ml Dexamethasone;Gait training;Stair training;Functional mobility training;Therapeutic activities;Therapeutic exercise;Balance training;Neuromuscular re-education;Manual techniques;Patient/family education;Passive range of motion;Dry needling;Joint  Manipulations;Spinal Manipulations;Vasopneumatic Device;Taping    PT Next Visit Plan Work on sit to stand and walking speed/ distance, turns. Work on Lt hip strength.  ADD to HEP    PT Home Exercise Plan glute sets, quad sets, heel slides; marching, heel raises, stand lateral hip isometric holds, Single leg bridge, clam, gluteal set with knee ext in hooklye    Consulted and Agree with Plan of Care Patient           Patient will benefit from skilled therapeutic intervention in order to improve the following deficits and impairments:  Abnormal gait, Decreased range of motion, Difficulty walking, Decreased mobility, Decreased strength, Decreased activity tolerance, Decreased knowledge of precautions  Visit Diagnosis: Muscle weakness (generalized)  Stiffness of left hip, not elsewhere classified  Difficulty in walking, not elsewhere classified     Problem List Patient Active Problem List   Diagnosis Date Noted  . Worsening headaches 01/21/2020  . Pruritus 10/30/2019  . Hypercalcemia 10/24/2019  . Body mass index 45.0-49.9, adult ([facility] 08/27/2019  . Intertrigo 07/09/2019  . Facial rash 07/27/2018  . Body mass index 40.0-44.9, adult ([facility] [place] 02/15/2018  . Fatigue associated with anemia 02/07/2018  . Unspecified open wound, left hip, initial encounter 01/19/2018  . Wound of left leg, sequela 10/03/2016  . 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[facility] 08/24/2016  . Status post left hip replacement 06/01/2016  . Sinus tachycardia 05/19/2016  . History of adenomatous polyp of colon 03/21/2016  . Diabetes ([facility] 01/21/2016  . Thyroid nodule 12/25/2014  . Infiltrate noted on imaging study   . Cervical spine arthritis 11/14/2014  . Primary osteoarthritis of left hip 11/14/2014  . Loss of consciousness ([facility] 11/14/2014  . Chest pain 03/26/2014  . Musculoskeletal chest and rib pain 03/26/2014  . Rib pain on right side 02/28/2014  . Groin pain 04/18/2013  . UNSPECIFIED RENAL SCLEROSIS  01/16/2009  . GERD, SEVERE 05/25/2007  . Morbid obesity ([facility] 04/03/2007  . Dyslipidemia 08/31/2006  . Major depression, recurrent ([facility] 08/31/2006  . 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 Former smoker 08/31/2006  . HYPERTENSION, BENIGN SYSTEMIC 08/31/2006  . GASTRIC ULCER ACUTE WITHOUT HEMORRHAGE 08/31/2006  . CONVULSIONS, SEIZURES,  NOS 08/31/2006    Darrel Hoover  PT 04/02/2020, 2:00 PM  [facility] [address] [place], [place], [address] Phone: [phone]   Fax:  [phone]  Name: Kristin [name] MRN: [id number] Date of Birth: Sep 07, 1955  PHYSICAL THERAPY DISCHARGE SUMMARY  Visits from Start of Care: 15  Current functional level related to goals / functional outcomes: See above She canceled   Final appointments and did not reschedule   Remaining deficits: Unknown   Education / Equipment: HEP Plan:                                                    Patient goals were partially met. Patient is being discharged due to not returning since the last visit.  ?????    Pearson Forster   PT   06/08/20

## 2020-04-07 ENCOUNTER — Ambulatory Visit: Payer: Medicaid Other | Admitting: Physical Therapy

## 2020-04-08 ENCOUNTER — Ambulatory Visit: Payer: Medicaid Other | Admitting: Orthopaedic Surgery

## 2020-04-09 ENCOUNTER — Ambulatory Visit: Payer: Medicaid Other | Admitting: Physical Therapy

## 2020-04-17 ENCOUNTER — Ambulatory Visit: Payer: Medicaid Other

## 2020-04-20 ENCOUNTER — Ambulatory Visit: Payer: Medicaid Other

## 2020-04-21 ENCOUNTER — Other Ambulatory Visit: Payer: Self-pay

## 2020-04-21 ENCOUNTER — Ambulatory Visit (INDEPENDENT_AMBULATORY_CARE_PROVIDER_SITE_OTHER): Payer: Medicaid Other | Admitting: Family Medicine

## 2020-04-21 VITALS — BP 122/80 | HR 98 | Ht 62.0 in | Wt 236.8 lb

## 2020-04-21 DIAGNOSIS — Z23 Encounter for immunization: Secondary | ICD-10-CM | POA: Diagnosis not present

## 2020-04-21 DIAGNOSIS — L304 Erythema intertrigo: Secondary | ICD-10-CM

## 2020-04-21 MED ORDER — NYSTATIN 100000 UNIT/GM EX POWD: 1.0000 "application " | Freq: Two times a day (BID) | CUTANEOUS | 0 refills | Status: DC

## 2020-04-21 NOTE — Assessment & Plan Note (Signed)
Left lateral posterior back skin fold with hyperpigmentation x1 month.  Minor open wounds likely due to scratching from pruritus.  Will treat symptomatically and monitor for improvement. -Continue hydrocortisone cream avoiding open areas -Start nystatin powder twice daily for 2 weeks -Follow-up if symptoms worsen or fail to improve

## 2020-04-21 NOTE — Progress Notes (Signed)
    SUBJECTIVE:   CHIEF COMPLAINT / HPI:   Ms. Costin is a 64 yo F who presents to access to care for the below issues.   Rash on back Present on left side of her back x1 month.  Is pruritic.  Pruritus has had some resolution with Vaseline, hydrocortisone, hydrogen peroxide, and Neosporin.  She denies pain, drainage, trauma to the area.  Need for immunization Would like to receive her flu shot today.   PERTINENT  PMH / PSH: History of intertrigo (under abdominal fold and inguinal folds treated with nystatin powder 9 months prior), history of osteoarthritis of the left hip and prosthetic joint requiring use of cane  OBJECTIVE:   BP 122/80   Pulse 98   Ht 5\' 2"  (1.575 m)   Wt 236 lb 12.8 oz (107.4 kg)   SpO2 100%   BMI 43.31 kg/m   General: Appears well, no acute distress. Age appropriate. Skin: Warm and dry, left lateral thoracic skin fold with hyperpigmentation and to lessen 3 mm open wounds that are noninfectious appearing MSK: Exaggeration of left side bending with sitting and standing  ASSESSMENT/PLAN:   Intertrigo Left lateral posterior back skin fold with hyperpigmentation x1 month.  Minor open wounds likely due to scratching from pruritus.  Will treat symptomatically and monitor for improvement. -Continue hydrocortisone cream avoiding open areas -Start nystatin powder twice daily for 2 weeks -Follow-up if symptoms worsen or fail to improve  Need for immunization against influenza - Flu Vaccine QUAD 36+ mos IM  Gerlene Fee, [place]

## 2020-04-21 NOTE — Patient Instructions (Signed)
It was wonderful to see you today.  Please bring ALL of your medications with you to every visit.   Today we talked about:  The rash on your back. This is intertrigo. You can continue to use hydrocortisone cream for itching but would be careful around the skin breaks. I have prescribed nystatin powder to be used twice daily on the area.  Attempt to keep this area clean and dry.  Follow-up in 2 weeks if rash fails to heal.  Please call the clinic at [phone] if you have any concerns. It was our pleasure to serve you.  Dr. Janus Molder

## 2020-04-22 ENCOUNTER — Ambulatory Visit: Payer: Medicaid Other | Admitting: Physical Therapy

## 2020-04-23 ENCOUNTER — Encounter: Payer: Self-pay | Admitting: Internal Medicine

## 2020-04-23 ENCOUNTER — Other Ambulatory Visit: Payer: Self-pay

## 2020-04-23 ENCOUNTER — Ambulatory Visit (INDEPENDENT_AMBULATORY_CARE_PROVIDER_SITE_OTHER): Payer: Medicaid Other | Admitting: Internal Medicine

## 2020-04-23 DIAGNOSIS — T8452XD Infection and inflammatory reaction due to internal left hip prosthesis, subsequent encounter: Secondary | ICD-10-CM

## 2020-04-23 NOTE — Assessment & Plan Note (Signed)
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  She is tolerating doxycycline well and I will have her continue it.  She is aware that the only test of cure would be to stop the doxycycline at some point in the future and wait and see what happens.  I will repeat her inflammatory markers today and see her back in 3 months.

## 2020-04-23 NOTE — Progress Notes (Signed)
[facility]  Patient Active Problem List   Diagnosis Date Noted  . 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 Worsening headaches 01/21/2020  . Pruritus 10/30/2019  . Hypercalcemia 10/24/2019  . Body mass index 45.0-49.9, adult ([place]) 08/27/2019  . Intertrigo 07/09/2019  . Facial rash 07/27/2018  . Body mass index 40.0-44.9, adult ([place]) 02/15/2018  . Fatigue associated with anemia 02/07/2018  . Unspecified open wound, left hip, initial encounter 01/19/2018  . Wound of left leg, sequela 10/03/2016  . Status post left hip replacement 06/01/2016  . Sinus tachycardia 05/19/2016  . History of adenomatous polyp of colon 03/21/2016  . Diabetes ([place]) 01/21/2016  . Thyroid nodule 12/25/2014  . Infiltrate noted on imaging study   . Cervical spine arthritis 11/14/2014  . Primary osteoarthritis of left hip 11/14/2014  . Loss of consciousness ([place]) 11/14/2014  . Chest pain 03/26/2014  . Musculoskeletal chest and rib pain 03/26/2014  . Rib pain on right side 02/28/2014  . Groin pain 04/18/2013  . UNSPECIFIED RENAL SCLEROSIS 01/16/2009  . GERD, SEVERE 05/25/2007  . Morbid obesity ([place]) 04/03/2007  . Dyslipidemia 08/31/2006  . Major depression, recurrent ([place]) 08/31/2006  . 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 Former smoker 08/31/2006  . HYPERTENSION, BENIGN SYSTEMIC 08/31/2006  . GASTRIC ULCER ACUTE WITHOUT HEMORRHAGE 08/31/2006  . CONVULSIONS, SEIZURES, NOS 08/31/2006    Patient's Medications  New Prescriptions   No medications on file  Previous Medications   ACETAMINOPHEN (TYLENOL) 650 MG CR TABLET    Take 1,300 mg by mouth every 8 (eight) hours as needed for pain.   AMITRIPTYLINE (ELAVIL) 75 MG TABLET    Take 2 tablets (150 mg total) by mouth at bedtime.   AMOXICILLIN (AMOXIL) 500 MG CAPSULE    Take 4 pills one hour prior to dental work   ASCORBIC ACID (VITAMIN C) 250 MG CHEW    Chew 250 mg by mouth daily.    CHOLECALCIFEROL (VITAMIN D3) 25 MCG (1000 UNIT) TABLET    Take 1,000 Units by mouth daily.   COLLAGENASE (SANTYL) OINTMENT    Apply 1 application topically daily.   CYCLOBENZAPRINE (FLEXERIL) 10 MG TABLET    Take 0.5 tablets (5 mg total) by mouth at bedtime.   DOXYCYCLINE (VIBRA-TABS) 100 MG TABLET    Take 1 tablet (100 mg total) by mouth 2 (two) times daily.   FERROUS SULFATE 325 (65 FE) MG TABLET    Take 1 tablet (325 mg total) by mouth 2 (two) times daily.   HYDROCHLOROTHIAZIDE (HYDRODIURIL) 25 MG TABLET    Take 1 tablet (25 mg total) by mouth daily.   IBUPROFEN (ADVIL) 200 MG TABLET    Take 400 mg by mouth every 6 (six) hours as needed for moderate pain.   LEVOCETIRIZINE (XYZAL) 5 MG TABLET    Take 1 tablet (5 mg total) by mouth every evening.   METFORMIN (GLUCOPHAGE) 500 MG TABLET    Take 1 tablet (500 mg total) by mouth 2 (two) times daily.   METOPROLOL TARTRATE (LOPRESSOR) 25 MG TABLET    Take 1 tablet (25 mg total) by mouth 2 (two) times daily.   NYSTATIN (MYCOSTATIN/NYSTOP) POWDER    Apply 1 application topically 2 (two) times daily. Apply to affected skin fold   OMEPRAZOLE (PRILOSEC) 20 MG CAPSULE    Take 20 mg by mouth at bedtime.   SIMVASTATIN (ZOCOR) 20 MG TABLET    Take  1 tablet (20 mg total) by mouth at bedtime.   TOPIRAMATE (TOPAMAX) 50 MG TABLET    Take 1 tablet (50 mg total) by mouth daily.   TRAZODONE (DESYREL) 50 MG TABLET    Take 25-50 mg by mouth at bedtime as needed.   ZINC GLUCONATE 50 MG TABLET    Take 50 mg by mouth daily.  Modified Medications   No medications on file  Discontinued Medications   No medications on file    Subjective: Kristin [name] is in for her hospital follow-up visit.  She is a 64 y.o. female who underwent left total hip arthroplasty on 06/01/2016.  She developed acute infection with MRSA in January 2018.  She underwent incision and drainage with polyexchange and was discharged on IV vancomycin before converting to oral doxycycline.  She completed therapy  in June 2018.  She has had intermittent boils form along her left hip incision since that time.  She has been treated with oral trimethoprim sulfamethoxazole.  She underwent scar revision on 01/19/2018.  No organisms were seen on Gram stain and cultures were negative.  Dr. Lyndee Leo Dillingham excised an 8 cm deep fistula on 07/17/2019.  There was no evidence of infection noted in the operative report and no cultures were obtained.  A CT scan this past February showed some sclerosis and fluid around the prosthesis.  An x-ray on 11/12/2019 showed lucency around the femoral stem.  She stopped taking trimethoprim sulfamethoxazole on 12/13/2019 and was admitted on 12/23/2019 with a plan for probable excision arthroplasty.  At the time of surgery the prosthesis was noted to be firmly in place with sclerotic bone.  She underwent debridement and polyexchange again.  No organisms were seen on stain and 1 22f 5 operative cultures grew rare Prevotella after she was discharged.  Initially she was sent her home on IV vancomycin and rifampin but I added metronidazole after discharge.  She completed 6 weeks of IVantibiotic therapy on 02/05/2020 before switching to oral doxycycline.  She has not had any problems tolerating doxycycline.  She has started physical therapy and is feeling better.  She still feels occasional "tightness" in her left hip but overall her pain is much improved.  Review of Systems: Review of Systems  Constitutional: Negative for fever.  Gastrointestinal: Negative for abdominal pain, diarrhea, nausea and vomiting.  Musculoskeletal: Positive for joint pain.    Past Medical History:  Diagnosis Date  . Allergy   . Anemia   . Anxiety   . Arthritis   . Back pain, chronic   . Borderline diabetes   . Bronchitis   . Constipation   . Cough   . Depression   . Epilepsy ([place])   . GERD (gastroesophageal reflux disease)   . Hyperlipidemia   . Hypertension   . IBS (irritable bowel syndrome)   . Joint pain    . Obesity   . Palpitations   . Pneumonia 2016  . Pre-diabetes   . 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 Rheumatoid arthritis ([place])   . Seizures ([place])    last >10 yrs ago- recorded 01/18/18  . Somatization disorder   . Tobacco abuse     Social History   Tobacco Use  . Smoking status: Former Smoker    Packs/day: 0.25    Years: 0.50    Pack years: 0.12    Types: Cigarettes    Quit date: 01/27/2014    Years since quitting: 6.2  . Smokeless tobacco:  Never Used  Vaping Use  . Vaping Use: Never used  Substance Use Topics  . Alcohol use: No  . Drug use: No    Family History  Problem Relation Age of Onset  . Other Mother        failed surgery  . Heart Problems Father        poss MI, not sure:per pt  . Alzheimer's disease Maternal Grandmother   . Heart disease Maternal Grandfather   . Colon cancer Maternal Uncle        not sure age of onset    Allergies  Allergen Reactions  . Fluconazole Swelling and Other (See Comments)    Face swelling  . Robitussin (Alcohol Free) [Guaifenesin] Palpitations and Other (See Comments)    Chest pain    Objective: Vitals:   04/23/20 0957  BP: 130/74  Pulse: 91  Temp: 98.2 F (36.8 C)  TempSrc: Oral  SpO2: 99%  Weight: 238 lb (108 kg)  Height: 5\' 2"  (1.575 m)   Body mass index is 43.53 kg/m.  Physical Exam Constitutional:      Comments: She is in good spirits.   Musculoskeletal:        General: No swelling or tenderness.     Comments: Her left hip incisions have healed nicely.  She has no pain with palpation around her left hip.  She walks with the aid of a cane.  Psychiatric:        Mood and Affect: Mood normal.     Lab Results Sed Rate  Date Value  03/12/2020 82 mm/h (H)  01/18/2018 79 mm/h (H)  10/03/2016 73 mm/hr (H)   CRP (mg/L)  Date Value  03/12/2020 42.7 (H)  01/18/2018 41.4 (H)  10/03/2016 50.9 (H)     Problem List Items Addressed This Visit      High   Prosthetic joint infection  of left hip ([place])    She is improving on therapy for her left prosthetic hip infection.  She is tolerating doxycycline well and I will have her continue it.  She is aware that the only test of cure would be to stop the doxycycline at some point in the future and wait and see what happens.  I will repeat her inflammatory markers today and see her back in 3 months.      Relevant Orders   C-reactive protein   Sedimentation rate       Michel Bickers, MD [facility] [place] [phone] pager   [phone] cell 04/23/2020, 10:27 AM

## 2020-04-24 LAB — SEDIMENTATION RATE: Sed Rate: 58 mm/h — ABNORMAL HIGH (ref 0–30)

## 2020-04-24 LAB — C-REACTIVE PROTEIN: CRP: 47.8 mg/L — ABNORMAL HIGH (ref <8.0)

## 2020-05-15 DIAGNOSIS — F331 Major depressive disorder, recurrent, moderate: Secondary | ICD-10-CM | POA: Diagnosis not present

## 2020-05-22 DIAGNOSIS — F331 Major depressive disorder, recurrent, moderate: Secondary | ICD-10-CM | POA: Diagnosis not present

## 2020-06-04 DIAGNOSIS — F331 Major depressive disorder, recurrent, moderate: Secondary | ICD-10-CM | POA: Diagnosis not present

## 2020-06-11 ENCOUNTER — Other Ambulatory Visit: Payer: Self-pay

## 2020-06-11 MED ORDER — SIMVASTATIN 20 MG PO TABS
20.0000 mg | ORAL_TABLET | Freq: Every day | ORAL | 3 refills | Status: DC
Start: 2020-06-11 — End: 2021-08-11

## 2020-06-24 ENCOUNTER — Ambulatory Visit (INDEPENDENT_AMBULATORY_CARE_PROVIDER_SITE_OTHER): Payer: Medicaid Other

## 2020-06-24 ENCOUNTER — Ambulatory Visit (INDEPENDENT_AMBULATORY_CARE_PROVIDER_SITE_OTHER): Payer: Medicaid Other | Admitting: Orthopaedic Surgery

## 2020-06-24 ENCOUNTER — Encounter: Payer: Self-pay | Admitting: Orthopaedic Surgery

## 2020-06-24 DIAGNOSIS — Z6841 Body Mass Index (BMI) 40.0 and over, adult: Secondary | ICD-10-CM | POA: Diagnosis not present

## 2020-06-24 DIAGNOSIS — T8452XD Infection and inflammatory reaction due to internal left hip prosthesis, subsequent encounter: Secondary | ICD-10-CM

## 2020-06-24 NOTE — Progress Notes (Signed)
Office Visit Note   Patient: Kristin [name]           Date of Birth: [birth year]           MRN: [id number] Visit Date: 06/24/2020              Requested by: Lyndee Hensen, DO [address],  [place] [id number] PCP: Lyndee Hensen, DO   Assessment & Plan: Visit Diagnoses:  1. 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 Body mass index 40.0-44.9, adult ([place])   3. Morbid obesity ([place])     Plan: Impression 6 months status post I&D left hip chronic infection.  She is doing well on doxycycline and will need to be on chronic suppression.  She sees ID clinic every 3 months.  Her inflammatory markers are stable.  From my standpoint she is stable.  We will see her back in another 6 months with repeat x-rays of the left hip.  Follow-Up Instructions: Return in about 6 months (around 12/23/2020).   Orders:  Orders Placed This Encounter  Procedures  . XR Pelvis 1-2 Views   No orders of the defined types were placed in this encounter.     Procedures: No procedures performed   Clinical Data: No additional findings.   Subjective: Chief Complaint  Patient presents with  . Left Hip - Routine Post Op, Follow-up    Kristin [name] is 6 months status post I&D left hip.  Overall doing well.  She denies any worsening symptoms.  Ambulates with a single-point cane.  Currently trying to lose weight.   Review of Systems   Objective: Vital Signs: There were no vitals taken for this visit.  Physical Exam  Ortho Exam Left hip shows fully healed surgical scars.  No swelling or signs of infection.  Mild antalgic gait.  Good range of motion without pain. Specialty Comments:  No specialty comments available.  Imaging: XR Pelvis 1-2 Views  Result Date: 06/24/2020 Stable left total hip replacement.  No complications.    PMFS History: Patient Active Problem List   Diagnosis Date Noted  . Worsening headaches 01/21/2020  . Pruritus  10/30/2019  . Hypercalcemia 10/24/2019  . Body mass index 45.0-49.9, adult ([place]) 08/27/2019  . Intertrigo 07/09/2019  . Facial rash 07/27/2018  . Body mass index 40.0-44.9, adult ([place]) 02/15/2018  . Fatigue associated with anemia 02/07/2018  . Unspecified open wound, left hip, initial encounter 01/19/2018  . Wound of left leg, sequela 10/03/2016  . 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 Status post left hip replacement 06/01/2016  . Sinus tachycardia 05/19/2016  . History of adenomatous polyp of colon 03/21/2016  . Diabetes ([place]) 01/21/2016  . Thyroid nodule 12/25/2014  . Infiltrate noted on imaging study   . Cervical spine arthritis 11/14/2014  . Primary osteoarthritis of left hip 11/14/2014  . Loss of consciousness ([place]) 11/14/2014  . Chest pain 03/26/2014  . Musculoskeletal chest and rib pain 03/26/2014  . Rib pain on right side 02/28/2014  . Groin pain 04/18/2013  . UNSPECIFIED RENAL SCLEROSIS 01/16/2009  . GERD, SEVERE 05/25/2007  . Morbid obesity ([place]) 04/03/2007  . Dyslipidemia 08/31/2006  . Major depression, recurrent ([place]) 08/31/2006  . 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 Former smoker 08/31/2006  . HYPERTENSION, BENIGN SYSTEMIC 08/31/2006  . GASTRIC ULCER ACUTE WITHOUT HEMORRHAGE 08/31/2006  . CONVULSIONS, SEIZURES, NOS 08/31/2006   Past Medical History:  Diagnosis Date  . Allergy   . Anemia   .  Anxiety   . Arthritis   . Back pain, chronic   . Borderline diabetes   . Bronchitis   . Constipation   . Cough   . Depression   . Epilepsy ([place])   . GERD (gastroesophageal reflux disease)   . Hyperlipidemia   . Hypertension   . IBS (irritable bowel syndrome)   . Joint pain   . Obesity   . Palpitations   . Pneumonia 2016  . Pre-diabetes   . 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 Rheumatoid arthritis ([place])   . Seizures ([place])    last >10 yrs ago- recorded 01/18/18  . Somatization disorder   . Tobacco abuse     Family  History  Problem Relation Age of Onset  . Other Mother        failed surgery  . Heart Problems Father        poss MI, not sure:per pt  . Alzheimer's disease Maternal Grandmother   . Heart disease Maternal Grandfather   . Colon cancer Maternal Uncle        not sure age of onset    Past Surgical History:  Procedure Laterality Date  . ABDOMINAL HYSTERECTOMY    . ANTERIOR HIP REVISION Left 07/25/2016   Procedure: LEFT HIP IRRIGATION AND DEBRIDEMENT, REVISION OF HEAD AND LINER;  Surgeon: Leandrew Koyanagi, MD;  Location: [facility];  Service: Orthopedics;  Laterality: Left;  . APPLICATION OF WOUND VAC Left 12/23/2019   Procedure: APPLICATION OF WOUND VAC;  Surgeon: Leandrew Koyanagi, MD;  Location: [facility];  Service: Orthopedics;  Laterality: Left;  . BREAST EXCISIONAL BIOPSY Right   . CHOLECYSTECTOMY    . COLONOSCOPY    . DEBRIDEMENT AND CLOSURE WOUND Left 07/17/2019   Procedure: Excision of left leg wound with ACell placement and primary closure;  Surgeon: Wallace Going, DO;  Location: [facility];  Service: Plastics;  Laterality: Left;  60 min  . EXCISIONAL TOTAL HIP ARTHROPLASTY WITH ANTIBIOTIC SPACERS Left 12/23/2019   Procedure: LEFT HIP IRRIGATION AND DEBRIDEMENT LEFT HIP WOUND and poly exchange ;  Surgeon: Leandrew Koyanagi, MD;  Location: [facility];  Service: Orthopedics;  Laterality: Left;  . HIP DEBRIDEMENT Left 01/19/2018   Procedure(s) Performed: IRRIGATION AND DEBRIDEMENT LEFT HIP (Left )  . INCISION AND DRAINAGE HIP Left 08/03/2016   Procedure: IRRIGATION AND DEBRIDEMENT LEFT HIP, VAC PLACEMENT;  Surgeon: Leandrew Koyanagi, MD;  Location: [facility];  Service: Orthopedics;  Laterality: Left;  . INCISION AND DRAINAGE HIP Left 01/19/2018   Procedure: IRRIGATION AND DEBRIDEMENT LEFT HIP;  Surgeon: Leandrew Koyanagi, MD;  Location: [facility];  Service: Orthopedics;  Laterality: Left;  . kidney stent Right 2002  . TONSILLECTOMY Bilateral 12/06/2014   Procedure: TONSILLECTOMY, BILATERAL;  Surgeon: Ruby Cola, MD;  Location: [facility];  Service: ENT;  Laterality: Bilateral;  . TONSILLECTOMY    . TOTAL HIP ARTHROPLASTY Left 06/01/2016   Procedure: LEFT TOTAL HIP ARTHROPLASTY ANTERIOR APPROACH;  Surgeon: Leandrew Koyanagi, MD;  Location: [facility];  Service: Orthopedics;  Laterality: Left;  . TUBAL LIGATION    . WOUND EXPLORATION Left 01/19/2018   Procedure: WOUND EXPLORATION;  Surgeon: Leandrew Koyanagi, MD;  Location: [facility];  Service: Orthopedics;  Laterality: Left;   Social History   Occupational History  . Occupation: Not Working  Tobacco Use  . Smoking status: Former Smoker    Packs/day: 0.25    Years: 0.50  Pack years: 0.12    Types: Cigarettes    Quit date: 01/27/2014    Years since quitting: 6.4  . Smokeless tobacco: Never Used  Vaping Use  . Vaping Use: Never used  Substance and Sexual Activity  . Alcohol use: No  . Drug use: No  . Sexual activity: Not on file

## 2020-07-06 ENCOUNTER — Other Ambulatory Visit: Payer: Self-pay | Admitting: Family Medicine

## 2020-07-24 DIAGNOSIS — F331 Major depressive disorder, recurrent, moderate: Secondary | ICD-10-CM | POA: Diagnosis not present

## 2020-07-29 ENCOUNTER — Ambulatory Visit (INDEPENDENT_AMBULATORY_CARE_PROVIDER_SITE_OTHER): Payer: Medicaid Other | Admitting: Internal Medicine

## 2020-07-29 ENCOUNTER — Other Ambulatory Visit: Payer: Self-pay

## 2020-07-29 ENCOUNTER — Encounter: Payer: Self-pay | Admitting: Internal Medicine

## 2020-07-29 DIAGNOSIS — T8452XA Infection and inflammatory reaction due to internal left hip prosthesis, initial encounter: Secondary | ICD-10-CM

## 2020-07-29 DIAGNOSIS — Z8619 Personal history of other infectious and parasitic diseases: Secondary | ICD-10-CM

## 2020-07-29 DIAGNOSIS — T8452XD Infection and inflammatory reaction due to internal left hip prosthesis, subsequent encounter: Secondary | ICD-10-CM

## 2020-07-29 MED ORDER — DOXYCYCLINE HYCLATE 100 MG PO TABS: 100.0000 mg | ORAL_TABLET | Freq: Two times a day (BID) | ORAL | 11 refills | Status: DC

## 2020-07-29 NOTE — Assessment & Plan Note (Signed)
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  She is aware that the only test of cure would be to stop doxycycline and wait to see what happens.  She prefers to stay on doxycycline for now.  She will follow-up with me in 6 months.

## 2020-07-29 NOTE — Progress Notes (Signed)
[facility] for [facility]ious [facility]  Patient Active Problem List   Diagnosis Date Noted  . 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 Worsening headaches 01/21/2020  . Pruritus 10/30/2019  . Hypercalcemia 10/24/2019  . Body mass index 45.0-49.9, adult ([place]) 08/27/2019  . Intertrigo 07/09/2019  . Facial rash 07/27/2018  . Body mass index 40.0-44.9, adult ([place]) 02/15/2018  . Fatigue associated with anemia 02/07/2018  . Unspecified open wound, left hip, initial encounter 01/19/2018  . Wound of left leg, sequela 10/03/2016  . Status post left hip replacement 06/01/2016  . Sinus tachycardia 05/19/2016  . History of adenomatous polyp of colon 03/21/2016  . Diabetes ([place]) 01/21/2016  . Thyroid nodule 12/25/2014  . Infiltrate noted on imaging study   . Cervical spine arthritis 11/14/2014  . Primary osteoarthritis of left hip 11/14/2014  . Loss of consciousness ([place]) 11/14/2014  . Chest pain 03/26/2014  . Musculoskeletal chest and rib pain 03/26/2014  . Rib pain on right side 02/28/2014  . Groin pain 04/18/2013  . UNSPECIFIED RENAL SCLEROSIS 01/16/2009  . GERD, SEVERE 05/25/2007  . Morbid obesity ([place]) 04/03/2007  . Dyslipidemia 08/31/2006  . Major depression, recurrent ([place]) 08/31/2006  . 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 Former smoker 08/31/2006  . HYPERTENSION, BENIGN SYSTEMIC 08/31/2006  . GASTRIC ULCER ACUTE WITHOUT HEMORRHAGE 08/31/2006  . CONVULSIONS, SEIZURES, NOS 08/31/2006    Patient's Medications  New Prescriptions   No medications on file  Previous Medications   ACETAMINOPHEN (TYLENOL) 650 MG CR TABLET    Take 1,300 mg by mouth every 8 (eight) hours as needed for pain.   AMITRIPTYLINE (ELAVIL) 75 MG TABLET    Take 2 tablets (150 mg total) by mouth at bedtime.   AMOXICILLIN (AMOXIL) 500 MG TABLET    Take 1,000 mg by mouth 2 (two) times daily.   ASCORBIC ACID (VITAMIN C) 250 MG CHEW    Chew 250 mg by mouth daily.    CHOLECALCIFEROL (VITAMIN D3) 25 MCG (1000 UNIT) TABLET    Take 1,000 Units by mouth daily.   COLLAGENASE (SANTYL) OINTMENT    Apply 1 application topically daily.   CYCLOBENZAPRINE (FLEXERIL) 10 MG TABLET    Take 0.5 tablets (5 mg total) by mouth at bedtime.   FERROUS SULFATE 325 (65 FE) MG TABLET    Take 1 tablet (325 mg total) by mouth 2 (two) times daily.   HYDROCHLOROTHIAZIDE (HYDRODIURIL) 25 MG TABLET    Take 1 tablet (25 mg total) by mouth daily.   HYDROCODONE-ACETAMINOPHEN (NORCO/VICODIN) 5-325 MG TABLET    TAKE 1 TABLET BY MOUTH EVERY 4 TO 6 HOURS AS NEEDED FOR PAIN   IBUPROFEN (ADVIL) 200 MG TABLET    Take 400 mg by mouth every 6 (six) hours as needed for moderate pain.   LEVOCETIRIZINE (XYZAL) 5 MG TABLET    Take 1 tablet (5 mg total) by mouth every evening.   METFORMIN (GLUCOPHAGE) 500 MG TABLET    TAKE ONE TABLET BY MOUTH TWICE DAILY   METOPROLOL TARTRATE (LOPRESSOR) 25 MG TABLET    Take 1 tablet (25 mg total) by mouth 2 (two) times daily.   NYSTATIN (MYCOSTATIN/NYSTOP) POWDER    Apply 1 application topically 2 (two) times daily. Apply to affected skin fold   OMEPRAZOLE (PRILOSEC) 20 MG CAPSULE    Take 20 mg by mouth at bedtime.   SIMVASTATIN (ZOCOR) 20 MG TABLET    Take 1 tablet (20  mg total) by mouth at bedtime.   TOPIRAMATE (TOPAMAX) 50 MG TABLET    Take 1 tablet (50 mg total) by mouth daily.   TRAZODONE (DESYREL) 50 MG TABLET    Take 25-50 mg by mouth at bedtime as needed.   ZINC GLUCONATE 50 MG TABLET    Take 50 mg by mouth daily.  Modified Medications   Modified Medication Previous Medication   DOXYCYCLINE (VIBRA-TABS) 100 MG TABLET doxycycline (VIBRA-TABS) 100 MG tablet      Take 1 tablet (100 mg total) by mouth 2 (two) times daily.    Take 1 tablet (100 mg total) by mouth 2 (two) times daily.  Discontinued Medications   AMOXICILLIN (AMOXIL) 500 MG CAPSULE    Take 4 pills one hour prior to dental work    Subjective: Kristin [name] is in for her hospital follow-up visit.  She is a  65 y.o. female who underwent left total hip arthroplasty on 06/01/2016.  She developed acute infection with MRSA in January 2018.  She underwent incision and drainage with polyexchange and was discharged on IV vancomycin before converting to oral doxycycline.  She completed therapy in June 2018.  She has had intermittent boils form along her left hip incision since that time.  She has been treated with oral trimethoprim sulfamethoxazole.  She underwent scar revision on 01/19/2018.  No organisms were seen on Gram stain and cultures were negative.  Dr. Lyndee Leo Dillingham excised an 8 cm deep fistula on 07/17/2019. There was no evidence of infection noted in the operative report and no cultures were obtained.  A CT scan this past February showed some sclerosis and fluid around the prosthesis.  An x-ray on 11/12/2019 showed lucency around the femoral stem.  She stopped taking trimethoprim sulfamethoxazole on 12/13/2019 and was admitted on 12/23/2019 with a plan for probable excision arthroplasty.  At the time of surgery the prosthesis was noted to be firmly in place with sclerotic bone.  She underwent debridement and polyexchange again.  No organisms were seen on stain and 1 27f 5 operative cultures grew rare Prevotella after she was discharged.  Initially she was sent her home on IV vancomycin and rifampin but I added metronidazole after discharge.  She completed 6 weeks of IVantibiotic therapy on 02/05/2020 before switching to oral doxycycline.  She has not had any problems tolerating doxycycline.    She is back to doing most of her usual activities.  She is still using a cane.  She requires only occasional acetaminophen for her pain.  Review of Systems: Review of Systems  Constitutional: Negative for fever.  Gastrointestinal: Negative for abdominal pain, diarrhea, nausea and vomiting.  Musculoskeletal: Positive for joint pain.    Past Medical History:  Diagnosis Date  . Allergy   . Anemia   . Anxiety   .  Arthritis   . Back pain, chronic   . Borderline diabetes   . Bronchitis   . Constipation   . Cough   . Depression   . Epilepsy ([place])   . GERD (gastroesophageal reflux disease)   . Hyperlipidemia   . Hypertension   . IBS (irritable bowel syndrome)   . Joint pain   . Obesity   . Palpitations   . Pneumonia 2016  . Pre-diabetes   . 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 Rheumatoid arthritis ([place])   . Seizures ([place])    last >10 yrs ago- recorded 01/18/18  . Somatization disorder   . Tobacco  abuse     Social History   Tobacco Use  . Smoking status: Former Smoker    Packs/day: 0.25    Years: 0.50    Pack years: 0.12    Types: Cigarettes    Quit date: 01/27/2014    Years since quitting: 6.5  . Smokeless tobacco: Never Used  Vaping Use  . Vaping Use: Never used  Substance Use Topics  . Alcohol use: No  . Drug use: No    Family History  Problem Relation Age of Onset  . Other Mother        failed surgery  . Heart Problems Father        poss MI, not sure:per pt  . Alzheimer's disease Maternal Grandmother   . Heart disease Maternal Grandfather   . Colon cancer Maternal Uncle        not sure age of onset    Allergies  Allergen Reactions  . Fluconazole Swelling and Other (See Comments)    Face swelling  . Robitussin (Alcohol Free) [Guaifenesin] Palpitations and Other (See Comments)    Chest pain    Objective: Vitals:   07/29/20 1130  BP: 128/84  Pulse: 81  Temp: 97.9 F (36.6 C)  TempSrc: Oral  SpO2: 96%  Weight: 244 lb (110.7 kg)  Height: 5\' 2"  (1.575 m)   Body mass index is 44.63 kg/m.  Physical Exam Constitutional:      Comments: Her spirits are good.  Musculoskeletal:        General: No swelling or tenderness.  Neurological:     Gait: Gait normal.  Psychiatric:        Mood and Affect: Mood normal.     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  She is aware that the only test of cure would be to stop doxycycline and wait to see what happens.  She prefers to stay on doxycycline for now.  She will follow-up with me in 6 months.      Relevant Medications   doxycycline (VIBRA-TABS) 100 MG tablet       Michel Bickers, MD [facility] for Infectious [place] [phone] pager   [phone] cell 07/29/2020, 11:59 AM

## 2020-07-31 ENCOUNTER — Encounter (HOSPITAL_COMMUNITY): Payer: Self-pay

## 2020-07-31 ENCOUNTER — Other Ambulatory Visit: Payer: Self-pay

## 2020-07-31 ENCOUNTER — Ambulatory Visit (HOSPITAL_COMMUNITY)
Admission: EM | Admit: 2020-07-31 | Discharge: 2020-07-31 | Disposition: A | Discharge status: Home / Self Care | Payer: Medicaid Other | Attending: Emergency Medicine | Admitting: Emergency Medicine

## 2020-07-31 DIAGNOSIS — L239 Allergic contact dermatitis, unspecified cause: Secondary | ICD-10-CM

## 2020-07-31 DIAGNOSIS — H6012 Cellulitis of left external ear: Secondary | ICD-10-CM | POA: Diagnosis not present

## 2020-07-31 MED ORDER — MUPIROCIN CALCIUM 2 % EX CREA: 1.0000 "application " | TOPICAL_CREAM | Freq: Two times a day (BID) | CUTANEOUS | 0 refills | Status: DC

## 2020-07-31 NOTE — ED Triage Notes (Signed)
Pt c/o rash to left posterior neck area for approx 1 week, swelling, tenderness to left ear lobe/pinna area for past several days.  Has been applying neosporin to areas.  Denies congestion, inner ear pain, runny nose, fever, cough.

## 2020-07-31 NOTE — ED Provider Notes (Signed)
[facility]    CSN: [id number] Arrival date & time: 07/31/20  1225      History   Chief Complaint Chief Complaint  Patient presents with  . Rash  . Otalgia    HPI Kristin [name] is a 65 y.o. female.   Patient presents with redness and swelling of her left ear x1 week.  She states she had similar symptoms on her right ear a couple of weeks ago but these resolved.  She denies fever, chills, earache, sore throat, difficulty swallowing, difficulty breathing, or other symptoms.  She states the symptoms started around the time she got a new hair piece touches her ears.  Treatment attempted at home with Neosporin.  Her medical history includes hypertension, seizures, diabetes, renal sclerosis, IBS, rheumatoid arthritis, chronic back pain, morbid obesity, depression, somatization disorder.  Patient was started on doxycycline on 07/29/2020 for infection associated with left hip prosthesis.  The history is provided by the patient and medical records.    Past Medical History:  Diagnosis Date  . Allergy   . Anemia   . Anxiety   . Arthritis   . Back pain, chronic   . Borderline diabetes   . Bronchitis   . Constipation   . Cough   . Depression   . Epilepsy ([place])   . GERD (gastroesophageal reflux disease)   . Hyperlipidemia   . Hypertension   . IBS (irritable bowel syndrome)   . Joint pain   . Obesity   . Palpitations   . Pneumonia 2016  . Pre-diabetes   . 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 Rheumatoid arthritis ([place])   . Seizures ([place])    last >10 yrs ago- recorded 01/18/18  . Somatization disorder   . Tobacco abuse     Patient Active Problem List   Diagnosis Date Noted  . Worsening headaches 01/21/2020  . Pruritus 10/30/2019  . Hypercalcemia 10/24/2019  . Body mass index 45.0-49.9, adult ([place]) 08/27/2019  . Intertrigo 07/09/2019  . Facial rash 07/27/2018  . Body mass index 40.0-44.9, adult ([place]) 02/15/2018  . Fatigue associated with anemia  02/07/2018  . Unspecified open wound, left hip, initial encounter 01/19/2018  . Wound of left leg, sequela 10/03/2016  . 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 Status post left hip replacement 06/01/2016  . Sinus tachycardia 05/19/2016  . History of adenomatous polyp of colon 03/21/2016  . Diabetes ([place]) 01/21/2016  . Thyroid nodule 12/25/2014  . Infiltrate noted on imaging study   . Cervical spine arthritis 11/14/2014  . Primary osteoarthritis of left hip 11/14/2014  . Loss of consciousness ([place]) 11/14/2014  . Chest pain 03/26/2014  . Musculoskeletal chest and rib pain 03/26/2014  . Rib pain on right side 02/28/2014  . Groin pain 04/18/2013  . UNSPECIFIED RENAL SCLEROSIS 01/16/2009  . GERD, SEVERE 05/25/2007  . Morbid obesity ([place]) 04/03/2007  . Dyslipidemia 08/31/2006  . Major depression, recurrent ([place]) 08/31/2006  . 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 Former smoker 08/31/2006  . HYPERTENSION, BENIGN SYSTEMIC 08/31/2006  . GASTRIC ULCER ACUTE WITHOUT HEMORRHAGE 08/31/2006  . CONVULSIONS, SEIZURES, NOS 08/31/2006    Past Surgical History:  Procedure Laterality Date  . ABDOMINAL HYSTERECTOMY    . ANTERIOR HIP REVISION Left 07/25/2016   Procedure: LEFT HIP IRRIGATION AND DEBRIDEMENT, REVISION OF HEAD AND LINER;  Surgeon: Leandrew Koyanagi, MD;  Location: [facility];  Service: Orthopedics;  Laterality: Left;  . APPLICATION OF WOUND  VAC Left 12/23/2019   Procedure: APPLICATION OF WOUND VAC;  Surgeon: Leandrew Koyanagi, MD;  Location: [facility];  Service: Orthopedics;  Laterality: Left;  . BREAST EXCISIONAL BIOPSY Right   . CHOLECYSTECTOMY    . COLONOSCOPY    . DEBRIDEMENT AND CLOSURE WOUND Left 07/17/2019   Procedure: Excision of left leg wound with ACell placement and primary closure;  Surgeon: Wallace Going, DO;  Location: [facility];  Service: Plastics;  Laterality: Left;  60 min  . EXCISIONAL TOTAL HIP ARTHROPLASTY WITH ANTIBIOTIC SPACERS Left 12/23/2019    Procedure: LEFT HIP IRRIGATION AND DEBRIDEMENT LEFT HIP WOUND and poly exchange ;  Surgeon: Leandrew Koyanagi, MD;  Location: [facility];  Service: Orthopedics;  Laterality: Left;  . HIP DEBRIDEMENT Left 01/19/2018   Procedure(s) Performed: IRRIGATION AND DEBRIDEMENT LEFT HIP (Left )  . INCISION AND DRAINAGE HIP Left 08/03/2016   Procedure: IRRIGATION AND DEBRIDEMENT LEFT HIP, VAC PLACEMENT;  Surgeon: Leandrew Koyanagi, MD;  Location: [facility];  Service: Orthopedics;  Laterality: Left;  . INCISION AND DRAINAGE HIP Left 01/19/2018   Procedure: IRRIGATION AND DEBRIDEMENT LEFT HIP;  Surgeon: Leandrew Koyanagi, MD;  Location: [facility];  Service: Orthopedics;  Laterality: Left;  . kidney stent Right 2002  . TONSILLECTOMY Bilateral 12/06/2014   Procedure: TONSILLECTOMY, BILATERAL;  Surgeon: Ruby Cola, MD;  Location: [facility];  Service: ENT;  Laterality: Bilateral;  . TONSILLECTOMY    . TOTAL HIP ARTHROPLASTY Left 06/01/2016   Procedure: LEFT TOTAL HIP ARTHROPLASTY ANTERIOR APPROACH;  Surgeon: Leandrew Koyanagi, MD;  Location: [facility];  Service: Orthopedics;  Laterality: Left;  . TUBAL LIGATION    . WOUND EXPLORATION Left 01/19/2018   Procedure: WOUND EXPLORATION;  Surgeon: Leandrew Koyanagi, MD;  Location: [facility];  Service: Orthopedics;  Laterality: Left;    OB History    Gravida  4   Para  3   Term      Preterm      AB  1   Living  3     SAB      IAB  1   Ectopic      Multiple      Live Births  3            Home Medications    Prior to Admission medications   Medication Sig Start Date End Date Taking? Authorizing Provider  acetaminophen (TYLENOL) 650 MG CR tablet Take 1,300 mg by mouth every 8 (eight) hours as needed for pain.   Yes [provider]  amitriptyline (ELAVIL) 75 MG tablet Take 2 tablets (150 mg total) by mouth at bedtime. 07/13/19  Yes Bonnita Hollow, MD  cholecalciferol (VITAMIN D3) 25 MCG (1000 UNIT) tablet Take 1,000 Units by mouth daily.   Yes [provider]   cyclobenzaprine (FLEXERIL) 10 MG tablet Take 0.5 tablets (5 mg total) by mouth at bedtime. 01/17/20  Yes Carollee Leitz, MD  ferrous sulfate 325 (65 FE) MG tablet Take 1 tablet (325 mg total) by mouth 2 (two) times daily. Patient taking differently: Take 325 mg by mouth daily. 11/29/16  Yes Smiley Houseman, MD  hydrochlorothiazide (HYDRODIURIL) 25 MG tablet Take 1 tablet (25 mg total) by mouth daily. 03/26/20  Yes Brimage, Vondra, DO  metFORMIN (GLUCOPHAGE) 500 MG tablet TAKE ONE TABLET BY MOUTH TWICE DAILY 07/07/20  Yes Brimage, Vondra, DO  metoprolol tartrate (LOPRESSOR) 25 MG tablet Take 1 tablet (25 mg total) by mouth 2 (two) times  daily. 10/22/19  Yes Bonnita Hollow, MD  mupirocin cream (BACTROBAN) 2 % Apply 1 application topically 2 (two) times daily. 07/31/20  Yes Sharion Balloon, NP  omeprazole (PRILOSEC) 20 MG capsule Take 20 mg by mouth at bedtime.   Yes [provider]  simvastatin (ZOCOR) 20 MG tablet Take 1 tablet (20 mg total) by mouth at bedtime. 06/11/20  Yes Brimage, Vondra, DO  traZODone (DESYREL) 50 MG tablet Take 25-50 mg by mouth at bedtime as needed. 04/07/20  Yes [provider]  amoxicillin (AMOXIL) 500 MG tablet Take 1,000 mg by mouth 2 (two) times daily. 05/14/20   [provider]  Ascorbic Acid (VITAMIN C) 250 MG CHEW Chew 250 mg by mouth daily.    [provider]  collagenase (SANTYL) ointment Apply 1 application topically daily. Patient not taking: No sig reported 01/07/20   Leandrew Koyanagi, MD  doxycycline (VIBRA-TABS) 100 MG tablet Take 1 tablet (100 mg total) by mouth 2 (two) times daily. 07/29/20   Michel Bickers, MD  HYDROcodone-acetaminophen (NORCO/VICODIN) 5-325 MG tablet TAKE 1 TABLET BY MOUTH EVERY 4 TO 6 HOURS AS NEEDED FOR PAIN 06/23/20   [provider]  ibuprofen (ADVIL) 200 MG tablet Take 400 mg by mouth every 6 (six) hours as needed for moderate pain.    [provider]  levocetirizine (XYZAL) 5 MG tablet Take  1 tablet (5 mg total) by mouth every evening. 10/30/19   Bonnita Hollow, MD  nystatin (MYCOSTATIN/NYSTOP) powder Apply 1 application topically 2 (two) times daily. Apply to affected skin fold 04/21/20   Autry-Lott, Naaman Plummer, DO  topiramate (TOPAMAX) 50 MG tablet Take 1 tablet (50 mg total) by mouth daily. Patient not taking: Reported on 07/29/2020 12/17/19   Jearld Lesch A, DO  zinc gluconate 50 MG tablet Take 50 mg by mouth daily.    [provider]    Family History Family History  Problem Relation Age of Onset  . Other Mother        failed surgery  . Heart Problems Father        poss MI, not sure:per pt  . Alzheimer's disease Maternal Grandmother   . Heart disease Maternal Grandfather   . Colon cancer Maternal Uncle        not sure age of onset    Social History Social History   Tobacco Use  . Smoking status: Former Smoker    Packs/day: 0.25    Years: 0.50    Pack years: 0.12    Types: Cigarettes    Quit date: 01/27/2014    Years since quitting: 6.5  . Smokeless tobacco: Never Used  Vaping Use  . Vaping Use: Never used  Substance Use Topics  . Alcohol use: No  . Drug use: No     Allergies   Fluconazole and Robitussin (alcohol free) [guaifenesin]   Review of Systems Review of Systems  Constitutional: Negative for chills and fever.  HENT: Negative for ear pain and sore throat.   Eyes: Negative for pain and visual disturbance.  Respiratory: Negative for cough and shortness of breath.   Cardiovascular: Negative for chest pain and palpitations.  Gastrointestinal: Negative for abdominal pain and vomiting.  Genitourinary: Negative for dysuria and hematuria.  Musculoskeletal: Negative for arthralgias and back pain.  Skin: Positive for rash. Negative for color change and wound.  Neurological: Negative for seizures and syncope.  All other systems reviewed and are negative.    Physical Exam Triage Vital Signs ED  Triage Vitals  Enc Vitals Group     BP       Pulse      Resp      Temp      Temp src      SpO2      Weight      Height      Head Circumference      Peak Flow      Pain Score      Pain Loc      Pain Edu?      Excl. in [place]?    No data found.  Updated Vital Signs BP 136/60 (BP Location: Left Arm)   Pulse 80   Temp (!) 97.4 F (36.3 C) (Oral)   Resp 18   Ht 5\' 2"  (1.575 m)   Wt 242 lb (109.8 kg)   SpO2 97%   BMI 44.26 kg/m   Visual Acuity Right Eye Distance:   Left Eye Distance:   Bilateral Distance:    Right Eye Near:   Left Eye Near:    Bilateral Near:     Physical Exam Vitals and nursing note reviewed.  Constitutional:      General: She is not in acute distress.    Appearance: She is well-developed and well-nourished. She is not ill-appearing.  HENT:     Head: Normocephalic and atraumatic.     Right Ear: Tympanic membrane, ear canal and external ear normal.     Left Ear: Tympanic membrane and ear canal normal.     Ears:     Comments: Left external ear mildly edematous with faint erythema.  No open wounds or drainage.    Nose: Nose normal.     Mouth/Throat:     Mouth: Mucous membranes are moist.     Pharynx: Oropharynx is clear.  Eyes:     Conjunctiva/sclera: Conjunctivae normal.  Cardiovascular:     Rate and Rhythm: Normal rate and regular rhythm.     Heart sounds: Normal heart sounds.  Pulmonary:     Effort: Pulmonary effort is normal. No respiratory distress.     Breath sounds: Normal breath sounds.  Abdominal:     Palpations: Abdomen is soft.     Tenderness: There is no abdominal tenderness.  Musculoskeletal:        General: No edema.     Cervical back: Neck supple.  Skin:    General: Skin is warm and dry.  Neurological:     General: No focal deficit present.     Mental Status: She is alert and oriented to person, place, and time.  Psychiatric:        Mood and Affect: Mood and affect and mood normal.        Behavior: Behavior normal.      UC Treatments / Results  Labs (all labs  ordered are listed, but only abnormal results are displayed) Labs Reviewed - No data to display  EKG   Radiology No results found.  Procedures Procedures (including critical care time)  Medications Ordered in UC Medications - No data to display  Initial Impression / Assessment and Plan / UC Course  I have reviewed the triage vital signs and the nursing notes.  Pertinent labs & imaging results that were available during my care of the patient were reviewed by me and considered in my medical decision making (see chart for details).   Allergic dermatitis, mild cellulitis of the left external ear.  Treating with Bactroban as patient is already  on doxycycline for another infection.  Instructed her to take [facility] also; precautions for drowsiness with this medication discussed.  Instructed patient to follow-up with her PCP if her symptoms are not improving.  She agrees to plan of care.   Final Clinical Impressions(s) / UC Diagnoses   Final diagnoses:  Allergic dermatitis  Cellulitis of left ear     Discharge Instructions     Use the Bactroban as directed.    Take Benadryl every 6 hours as directed; do not drive, operate machinery, or drink alcohol with this medication as it may cause drowsiness.  If you need to be awake and alert, take Claritin as directed.    Follow up with your primary care provider if your symptoms are not improving.             ED Prescriptions    Medication Sig Dispense Auth. Provider   mupirocin cream (BACTROBAN) 2 % Apply 1 application topically 2 (two) times daily. 15 g Sharion Balloon, NP     PDMP not reviewed this encounter.   Sharion Balloon, NP 07/31/20 1356

## 2020-07-31 NOTE — Discharge Instructions (Signed)
Use the Bactroban as directed.    Take Benadryl every 6 hours as directed; do not drive, operate machinery, or drink alcohol with this medication as it may cause drowsiness.  If you need to be awake and alert, take Claritin as directed.    Follow up with your primary care provider if your symptoms are not improving.

## 2020-08-03 ENCOUNTER — Ambulatory Visit: Payer: Medicaid Other

## 2020-08-04 DIAGNOSIS — F331 Major depressive disorder, recurrent, moderate: Secondary | ICD-10-CM | POA: Diagnosis not present

## 2020-08-28 DIAGNOSIS — F331 Major depressive disorder, recurrent, moderate: Secondary | ICD-10-CM | POA: Diagnosis not present

## 2020-09-08 ENCOUNTER — Other Ambulatory Visit: Payer: Self-pay | Admitting: Family Medicine

## 2020-09-08 DIAGNOSIS — Z1231 Encounter for screening mammogram for malignant neoplasm of breast: Secondary | ICD-10-CM

## 2020-09-09 ENCOUNTER — Other Ambulatory Visit: Payer: Self-pay

## 2020-09-09 MED ORDER — METOPROLOL TARTRATE 25 MG PO TABS
25.0000 mg | ORAL_TABLET | Freq: Two times a day (BID) | ORAL | 3 refills | Status: DC
Start: 2020-09-09 — End: 2021-09-07

## 2020-09-16 ENCOUNTER — Ambulatory Visit (INDEPENDENT_AMBULATORY_CARE_PROVIDER_SITE_OTHER): Payer: Medicaid Other | Admitting: Family Medicine

## 2020-09-16 ENCOUNTER — Other Ambulatory Visit: Payer: Self-pay

## 2020-09-16 VITALS — BP 130/75 | HR 90 | Ht 62.0 in | Wt 248.6 lb

## 2020-09-16 DIAGNOSIS — R21 Rash and other nonspecific skin eruption: Secondary | ICD-10-CM | POA: Diagnosis not present

## 2020-09-16 DIAGNOSIS — L299 Pruritus, unspecified: Secondary | ICD-10-CM

## 2020-09-16 MED ORDER — DIPHENHYDRAMINE-ZINC ACETATE 2-0.1 % EX CREA
1.0000 | TOPICAL_CREAM | Freq: Three times a day (TID) | CUTANEOUS | 0 refills | Status: DC | PRN
Start: 2020-09-16 — End: 2021-08-09

## 2020-09-16 NOTE — Patient Instructions (Addendum)
I suspect that you are being bitten by a bug. I doubt this is cellulitis. See handouts for more information. Feel free to send me pictures via MyChart.   Take Care,  Dr. Susa Simmonds   Scabies, Adult  Scabies is a skin condition that happens when very small insects called mites get under the skin (infestation). This causes a rash and severe itchiness. Scabies is contagious, which means it can spread from person to person. If you get scabies, it is common for others in your household to get scabies too. With proper treatment, symptoms usually go away in 2-4 weeks. Scabies usually does not cause lasting problems. What are the causes? This condition is caused by tiny mites (Sarcoptes scabiei, or human itch mites) that can only be seen with a microscope. The mites get into the top layer of skin and lay eggs. Scabies can spread from person to person through:  Close contact with a person who has scabies.  Sharing or having contact with infested items, such as towels, bedding, or clothing. What increases the risk? The following factors may make you more likely to develop this condition:  Living in a nursing home or other extended care facility.  Having sexual contact with a partner who has scabies.  Caring for others who are at increased risk for scabies. What are the signs or symptoms? Symptoms of this condition include:  Severe itchiness. This is often worse at night.  A rash that includes tiny red bumps or blisters. The rash commonly occurs on the hands, wrists, elbows, armpits, chest, waist, groin, or buttocks. The bumps may form a line (burrow) in some areas.  Skin irritation. This can include scaly patches or sores. How is this diagnosed? This condition may be diagnosed based on:  A physical exam of the skin.  A skin test. Your health care provider may take a sample of your affected skin (skin scraping) and have it examined under a microscope for signs of mites. How is this  treated? This condition may be treated with:  Medicated cream or lotion that kills the mites. This is spread on the entire body and left on for several hours. Usually, one treatment with medicated cream or lotion is enough to kill all the mites. In severe cases, the treatment may need to be repeated.  Medicated cream that relieves itching.  Medicines taken by mouth (orally) that: ? Relieve itching. ? Reduce the swelling and redness. ? Kill the mites. This treatment may be done in severe cases. Follow these instructions at home: Medicines  Take or apply over-the-counter and prescription medicines only as told by your health care provider.  Apply medicated cream or lotion as told by your health care provider.  Do not wash off the medicated cream or lotion until the necessary amount of time has passed. Skin care  Avoid scratching the affected areas of your skin.  Keep your fingernails closely trimmed to reduce injury from scratching.  Take cool baths or apply cool washcloths to your skin to help reduce itching. General instructions  Clean all items that you had contact with during the 3 days before diagnosis. This includes bedding, clothing, towels, and furniture. Do this on the same day that you start treatment. ? Dry-clean items, or use hot water to wash items. Dry items on the hot dry cycle. ? Place items that cannot be washed into closed, airtight plastic bags for at least 3 days. The mites cannot live for more than 3 days away from human  skin. ? Vacuum furniture and mattresses that you use.  Make sure that other people who may have been infested are examined by a health care provider. These include members of your household and anyone who may have had contact with infested items.  Keep all follow-up visits. This is important. Where to find more information  Centers for Disease Control and Prevention: http://www.wolf.info/ Contact a health care provider if:  You have itching that does  not go away after 4 weeks of treatment.  You continue to develop new bumps or burrows.  You have redness, swelling, or pain in your rash area after treatment.  You have fluid, blood, or pus coming from your rash. Summary  Scabies is a skin condition that causes a rash and severe itchiness.  This condition is caused by tiny mites that get into the top layer of the skin and lay eggs.  Scabies can spread from person to person.  Follow treatments as recommended by your health care provider.  Clean all items that you recently had contact with. This information is not intended to replace advice given to you by your health care provider. Make sure you discuss any questions you have with your health care provider. Document Revised: 10/18/2019 Document Reviewed: 10/18/2019 Elsevier Patient Education  [employer].

## 2020-09-16 NOTE — Progress Notes (Signed)
   SUBJECTIVE:   CHIEF COMPLAINT / HPI:   Chief Complaint  Patient presents with  . Rash    Pt stated she has a rash on her neck and several other places. Pt stated its been going on for 2 months.     Kristin [name] is a 65 y.o. female here for the couple months. At first the itching was on her ears. She thought it could be related to her wig but she has washed it multiple times.  She went to the urgent care at the end of Jan who gave he a cream that helped but itching returned. Sx thought to be cellulitis.  Pt used Neosporin, cortisone cream and alcohol on the areas without relief. She has been taking Benadryl to help with the itching which is worse at night and also when she sits on her couch. Rash has been behind her ears, proximal anterior chest wall, neck,  bilateral lower legs. She keeps her grandkids who at times sleep in the bed with her and her husband.   Pt reports taking doxycyline for a "bug they really couldn't get to" in her hip that she will likely be on antibiotics for the rest of her life.    PERTINENT  PMH / PSH: reviewed and updated as appropriate   OBJECTIVE:   BP 130/75   Pulse 90   Ht 5\' 2"  (1.575 m)   Wt 248 lb 9.6 oz (112.8 kg)   SpO2 98%   BMI 45.47 kg/m    GEN: well appearing female in no acute distress  CVS: well perfused  RESP: speaking in full sentences without pause, no respiratory distress  SKIN: no skins of rash behind bilateral ears, she does have lesions in three on her legs and upper chest wall.      ASSESSMENT/PLAN:   Rash Pt is a 65 yo female with rash on intermittent neck, chest and extremities for the past 2 months. Today evidence of rash on left lower leg, superior chest wall/ anterior neck line. Suspect bug bites given the pattern. Bed bugs more likely than scabies given itching and history. Handouts given for both. Pt to send pictures when new "rash" appears. Advised to wash everything applicable and dry in high heat. Topical  anti-itch cream sent to pharmacy. Follow up as needed.    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   Kristin Hensen, DO PGY-2, [facility] 09/16/2020

## 2020-09-17 ENCOUNTER — Ambulatory Visit: Payer: Medicaid Other

## 2020-09-18 DIAGNOSIS — R21 Rash and other nonspecific skin eruption: Secondary | ICD-10-CM | POA: Insufficient documentation

## 2020-09-18 NOTE — Assessment & Plan Note (Addendum)
Pt is a 65 yo female with rash on intermittent neck, chest and extremities for the past 2 months. Today evidence of rash on left lower leg, superior chest wall/ anterior neck line. Suspect bug bites given the pattern. Bed bugs more likely than scabies given itching and history. Handouts given for both. Pt to send pictures when new "rash" appears. Advised to wash everything applicable and dry in high heat. Topical anti-itch cream sent to pharmacy. Follow up as needed.

## 2020-10-22 DIAGNOSIS — F331 Major depressive disorder, recurrent, moderate: Secondary | ICD-10-CM | POA: Diagnosis not present

## 2020-10-23 ENCOUNTER — Inpatient Hospital Stay: Admission: RE | Admit: 2020-10-23 | Payer: Medicaid Other | Source: Ambulatory Visit

## 2020-10-26 ENCOUNTER — Ambulatory Visit (INDEPENDENT_AMBULATORY_CARE_PROVIDER_SITE_OTHER): Payer: Medicaid Other

## 2020-10-26 ENCOUNTER — Ambulatory Visit (HOSPITAL_COMMUNITY)
Admission: EM | Admit: 2020-10-26 | Discharge: 2020-10-26 | Disposition: A | Discharge status: Home / Self Care | Payer: Medicaid Other | Attending: Family Medicine | Admitting: Family Medicine

## 2020-10-26 ENCOUNTER — Other Ambulatory Visit: Payer: Self-pay

## 2020-10-26 ENCOUNTER — Encounter (HOSPITAL_COMMUNITY): Payer: Self-pay

## 2020-10-26 DIAGNOSIS — S92515A Nondisplaced fracture of proximal phalanx of left lesser toe(s), initial encounter for closed fracture: Secondary | ICD-10-CM | POA: Diagnosis not present

## 2020-10-26 DIAGNOSIS — M79675 Pain in left toe(s): Secondary | ICD-10-CM

## 2020-10-26 DIAGNOSIS — S92502A Displaced unspecified fracture of left lesser toe(s), initial encounter for closed fracture: Secondary | ICD-10-CM | POA: Diagnosis not present

## 2020-10-26 DIAGNOSIS — X58XXXA Exposure to other specified factors, initial encounter: Secondary | ICD-10-CM

## 2020-10-26 NOTE — Discharge Instructions (Addendum)
If not allergic, you may use over the counter ibuprofen or acetaminophen as needed. ° °

## 2020-10-26 NOTE — ED Triage Notes (Signed)
Pt in with c/o left 4th toe injury that occurred on Wednesday  Pt states she has noticed swelling and bruising  Pt has been taking ibuprofen and tylenol for pain relief

## 2020-10-27 ENCOUNTER — Telehealth: Payer: Self-pay

## 2020-10-27 NOTE — Telephone Encounter (Signed)
Patient calls nurse line after being seen in the UC for broken toe. Patient is requesting advice on treatment for broken toe. Recommended buddy taping, OTC pain medicine and ice to help with pain and swelling. Advised using barrier between ice pack and keeping ice on area no longer than 15 minutes at a time.   Patient is asking how long she should wear shoe that was provided in UC. Please advise any additional recommendations.   Talbot Grumbling, RN

## 2020-10-28 ENCOUNTER — Ambulatory Visit: Payer: Medicaid Other

## 2020-10-28 NOTE — ED Provider Notes (Signed)
[facility]   [id number] 10/26/20 Arrival Time: [place]  ASSESSMENT & PLAN:  1. Closed fracture of phalanx of left fourth toe, initial encounter     I have personally viewed the imaging studies ordered this visit. LEFT 4th toe fracture.   Prefers OTC analgesics.  Orders Placed This Encounter  Procedures  . DG Foot Complete Left  . Apply Post op shoe  WBAT.  Recommend:  Follow-up Information    [facility] at [facility].   Specialty: Urgent Care Why: If worsening or failing to improve as anticipated. Contact information: [place]       Lyndee Hensen, DO.   Specialty: Family Medicine Why: As needed. Contact information: [address] [place] [address] [phone]                Reviewed expectations re: course of current medical issues. Questions answered. Outlined signs and symptoms indicating need for more acute intervention. Patient verbalized understanding. After Visit Summary given.  SUBJECTIVE: History from: patient. Kristin [name] is a 65 y.o. female who reports hitting L foot against chair; few d ago; painful and swollen since around L 4th toe. Able to bear wt but with pain. No extremity sensation changes or weakness. Ibuprofen and Tylenol with some relief.   Past Surgical History:  Procedure Laterality Date  . ABDOMINAL HYSTERECTOMY    . ANTERIOR HIP REVISION Left 07/25/2016   Procedure: LEFT HIP IRRIGATION AND DEBRIDEMENT, REVISION OF HEAD AND LINER;  Surgeon: Leandrew Koyanagi, MD;  Location: [place];  Service: Orthopedics;  Laterality: Left;  . APPLICATION OF WOUND VAC Left 12/23/2019   Procedure: APPLICATION OF WOUND VAC;  Surgeon: Leandrew Koyanagi, MD;  Location: [place];  Service: Orthopedics;  Laterality: Left;  . BREAST EXCISIONAL BIOPSY Right   . CHOLECYSTECTOMY    . COLONOSCOPY    . DEBRIDEMENT AND CLOSURE WOUND Left 07/17/2019   Procedure: Excision of left leg  wound with ACell placement and primary closure;  Surgeon: Wallace Going, DO;  Location: [place];  Service: Plastics;  Laterality: Left;  60 min  . EXCISIONAL TOTAL HIP ARTHROPLASTY WITH ANTIBIOTIC SPACERS Left 12/23/2019   Procedure: LEFT HIP IRRIGATION AND DEBRIDEMENT LEFT HIP WOUND and poly exchange ;  Surgeon: Leandrew Koyanagi, MD;  Location: [place];  Service: Orthopedics;  Laterality: Left;  . HIP DEBRIDEMENT Left 01/19/2018   Procedure(s) Performed: IRRIGATION AND DEBRIDEMENT LEFT HIP (Left )  . INCISION AND DRAINAGE HIP Left 08/03/2016   Procedure: IRRIGATION AND DEBRIDEMENT LEFT HIP, VAC PLACEMENT;  Surgeon: Leandrew Koyanagi, MD;  Location: [place];  Service: Orthopedics;  Laterality: Left;  . INCISION AND DRAINAGE HIP Left 01/19/2018   Procedure: IRRIGATION AND DEBRIDEMENT LEFT HIP;  Surgeon: Leandrew Koyanagi, MD;  Location: [place];  Service: Orthopedics;  Laterality: Left;  . kidney stent Right 2002  . TONSILLECTOMY Bilateral 12/06/2014   Procedure: TONSILLECTOMY, BILATERAL;  Surgeon: Ruby Cola, MD;  Location: [place];  Service: ENT;  Laterality: Bilateral;  . TONSILLECTOMY    . TOTAL HIP ARTHROPLASTY Left 06/01/2016   Procedure: LEFT TOTAL HIP ARTHROPLASTY ANTERIOR APPROACH;  Surgeon: Leandrew Koyanagi, MD;  Location: [place];  Service: Orthopedics;  Laterality: Left;  . TUBAL LIGATION    . WOUND EXPLORATION Left 01/19/2018   Procedure: WOUND EXPLORATION;  Surgeon: Leandrew Koyanagi, MD;  Location: [place];  Service: Orthopedics;  Laterality: Left;  OBJECTIVE:  Vitals:   10/26/20 1026  BP: 133/70  Pulse: 80  Resp: 17  Temp: 98.2 F (36.8 C)  TempSrc: Oral  SpO2: 100%    General appearance: alert; no distress HEENT: [place]; AT Neck: supple with FROM Resp: unlabored respirations Extremities: . LLE: warm with well perfused appearance; fairly well localized moderate tenderness over left 4th toe; without gross deformities; swelling: minimal; bruising: minimal CV: brisk extremity  capillary refill of LLE; 2+ DP pulse of LLE. Skin: warm and dry; no visible rashes Neurologic: normal sensation and strength of LLE Psychological: alert and cooperative; normal mood and affect  Imaging: DG Foot Complete Left  Result Date: 10/26/2020 CLINICAL DATA:  Fourth toe pain. Trauma 5 days ago, initial encounter. EXAM: LEFT FOOT - COMPLETE 3+ VIEW COMPARISON:  None. FINDINGS: There is a nondisplaced and obliquely oriented fracture of the fourth proximal phalangeal shaft. Unclear if the fracture extends into the proximal interphalangeal joint. No additional evidence of an acute fracture. Calcaneal spurs. IMPRESSION: Obliquely oriented nondisplaced fracture of the fourth proximal phalangeal shaft. Electronically Signed   By: Lorin Picket M.D.   On: 10/26/2020 10:58      Allergies  Allergen Reactions  . Fluconazole Swelling and Other (See Comments)    Face swelling  . Robitussin (Alcohol Free) [Guaifenesin] Palpitations and Other (See Comments)    Chest pain    Past Medical History:  Diagnosis Date  . Allergy   . Anemia   . Anxiety   . Arthritis   . Back pain, chronic   . Borderline diabetes   . Bronchitis   . Constipation   . Cough   . Depression   . Epilepsy ([place])   . GERD (gastroesophageal reflux disease)   . Hyperlipidemia   . Hypertension   . IBS (irritable bowel syndrome)   . Joint pain   . Obesity   . Palpitations   . Pneumonia 2016  . Pre-diabetes   . 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 Rheumatoid arthritis ([place])   . Seizures ([place])    last >10 yrs ago- recorded 01/18/18  . Somatization disorder   . Tobacco abuse    Social History   Socioeconomic History  . Marital status: Married    Spouse name: Herbie [place]   . Number of children: 3  . Years of education: Not on file  . Highest education level: 12th grade  Occupational History  . Occupation: Not Working  Tobacco Use  . Smoking status: Former Smoker    Packs/day: 0.25    Years:  0.50    Pack years: 0.12    Types: Cigarettes    Quit date: 01/27/2014    Years since quitting: 6.7  . Smokeless tobacco: Never Used  Vaping Use  . Vaping Use: Never used  Substance and Sexual Activity  . Alcohol use: No  . Drug use: No  . Sexual activity: Not on file  Other Topics Concern  . Not on file  Social History Narrative  . Not on file   Social Determinants of Health   Financial Resource Strain: Not on file  Food Insecurity: Not on file  Transportation Needs: Not on file  Physical Activity: Not on file  Stress: Not on file  Social Connections: Not on file   Family History  Problem Relation Age of Onset  . Other Mother        failed surgery  . Heart Problems Father  poss MI, not sure:per pt  . Alzheimer's disease Maternal Grandmother   . Heart disease Maternal Grandfather   . Colon cancer Maternal Uncle        not sure age of onset   Past Surgical History:  Procedure Laterality Date  . ABDOMINAL HYSTERECTOMY    . ANTERIOR HIP REVISION Left 07/25/2016   Procedure: LEFT HIP IRRIGATION AND DEBRIDEMENT, REVISION OF HEAD AND LINER;  Surgeon: Leandrew Koyanagi, MD;  Location: [place];  Service: Orthopedics;  Laterality: Left;  . APPLICATION OF WOUND VAC Left 12/23/2019   Procedure: APPLICATION OF WOUND VAC;  Surgeon: Leandrew Koyanagi, MD;  Location: [place];  Service: Orthopedics;  Laterality: Left;  . BREAST EXCISIONAL BIOPSY Right   . CHOLECYSTECTOMY    . COLONOSCOPY    . DEBRIDEMENT AND CLOSURE WOUND Left 07/17/2019   Procedure: Excision of left leg wound with ACell placement and primary closure;  Surgeon: Wallace Going, DO;  Location: [place];  Service: Plastics;  Laterality: Left;  60 min  . EXCISIONAL TOTAL HIP ARTHROPLASTY WITH ANTIBIOTIC SPACERS Left 12/23/2019   Procedure: LEFT HIP IRRIGATION AND DEBRIDEMENT LEFT HIP WOUND and poly exchange ;  Surgeon: Leandrew Koyanagi, MD;  Location: [facility];  Service: Orthopedics;  Laterality: Left;  . HIP  DEBRIDEMENT Left 01/19/2018   Procedure(s) Performed: IRRIGATION AND DEBRIDEMENT LEFT HIP (Left )  . INCISION AND DRAINAGE HIP Left 08/03/2016   Procedure: IRRIGATION AND DEBRIDEMENT LEFT HIP, VAC PLACEMENT;  Surgeon: Leandrew Koyanagi, MD;  Location: [place];  Service: Orthopedics;  Laterality: Left;  . INCISION AND DRAINAGE HIP Left 01/19/2018   Procedure: IRRIGATION AND DEBRIDEMENT LEFT HIP;  Surgeon: Leandrew Koyanagi, MD;  Location: [place];  Service: Orthopedics;  Laterality: Left;  . kidney stent Right 2002  . TONSILLECTOMY Bilateral 12/06/2014   Procedure: TONSILLECTOMY, BILATERAL;  Surgeon: Ruby Cola, MD;  Location: [place];  Service: ENT;  Laterality: Bilateral;  . TONSILLECTOMY    . TOTAL HIP ARTHROPLASTY Left 06/01/2016   Procedure: LEFT TOTAL HIP ARTHROPLASTY ANTERIOR APPROACH;  Surgeon: Leandrew Koyanagi, MD;  Location: [place];  Service: Orthopedics;  Laterality: Left;  . TUBAL LIGATION    . WOUND EXPLORATION Left 01/19/2018   Procedure: WOUND EXPLORATION;  Surgeon: Leandrew Koyanagi, MD;  Location: [place];  Service: Orthopedics;  Laterality: Left;      Vanessa Kick, MD 10/28/20 1225

## 2020-11-05 DIAGNOSIS — F4310 Post-traumatic stress disorder, unspecified: Secondary | ICD-10-CM | POA: Diagnosis not present

## 2020-11-19 DIAGNOSIS — F331 Major depressive disorder, recurrent, moderate: Secondary | ICD-10-CM | POA: Diagnosis not present

## 2020-12-08 ENCOUNTER — Other Ambulatory Visit: Payer: Self-pay | Admitting: Family Medicine

## 2020-12-23 ENCOUNTER — Encounter: Payer: Self-pay | Admitting: Orthopaedic Surgery

## 2020-12-23 ENCOUNTER — Ambulatory Visit (INDEPENDENT_AMBULATORY_CARE_PROVIDER_SITE_OTHER): Payer: Medicaid Other | Admitting: Orthopaedic Surgery

## 2020-12-23 ENCOUNTER — Ambulatory Visit: Payer: Self-pay

## 2020-12-23 ENCOUNTER — Ambulatory Visit (INDEPENDENT_AMBULATORY_CARE_PROVIDER_SITE_OTHER): Payer: Medicaid Other

## 2020-12-23 DIAGNOSIS — T8452XD Infection and inflammatory reaction due to internal left hip prosthesis, subsequent encounter: Secondary | ICD-10-CM | POA: Diagnosis not present

## 2020-12-23 DIAGNOSIS — M25551 Pain in right hip: Secondary | ICD-10-CM | POA: Diagnosis not present

## 2020-12-23 NOTE — Progress Notes (Signed)
Office Visit Note   Patient: Kristin [name]           Date of Birth: [birth year]           MRN: [id number] Visit Date: 12/23/2020              Requested by: Lyndee Hensen, DO [address],  [place] [address] PCP: Lyndee Hensen, DO   Assessment & Plan: Visit Diagnoses:  1. 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 Pain in right hip     Plan: Left hip impression is 1 year status post I&D for chronic infection with soft tissue swelling in the lateral hip since the surgery. We ordered a CT of the left hip to further evaluate the source of the swelling. She will receive a call from the hospital to schedule this. In regards to right hip, she has significant degenerative changes on this side as well. We discussed treatment options including no treatment and living with pain, weight loss and cortisone injections. Unfortunately she is not a surgical candidate at her current BMI. We emphasized the importance of weight loss in managing her hip pain. Patient demonstrated a good understanding and would like to proceed with a hip injection on the right. We will arrange for her to see Dr. Junius Roads when he is next available.   Follow-Up Instructions: Follow up after CT of the left hip  Orders:  Orders Placed This Encounter  Procedures   XR HIP UNILAT W OR W/O PELVIS 2-3 VIEWS LEFT   XR HIP UNILAT W OR W/O PELVIS 2-3 VIEWS RIGHT   CT HIP LEFT W CONTRAST   No orders of the defined types were placed in this encounter.     Procedures: No procedures performed   Clinical Data: No additional findings.   Subjective: Chief Complaint  Patient presents with   Left Hip - Follow-up   Right Hip - Pain    HPI Kristin [name] is a 65 y.o. female 1 year status post I&D left hip chronic infection. She had been doing well but reports new left hip swelling that started a couple week ago. She has pain in this hip from time to time that she manages  with Tylenol/Ibuprofen, but no severe pain, redness or drainage. No fevers or chills. She continues to be on doxycycline. She will be seeing infectious disease next month. She also reports right hip pain that started 3 months ago. She describes sharp groin pain and some pain in the lateral hip. The groin pain is made worse with walking and prolonged stranding. She denies mechanical symptoms or night pain. She has not had any injections or done any therapy for her right hip. She takes Tylenol and Ibuprofen as needed and uses ice and heat on her hip. She walks with a single point cane.   Review of Systems Review of Systems was reviewed and negative unless as stated in the HPI.   Objective: Vital Signs: There were no vitals taken for this visit.  Physical Exam  Ortho Exam Left hip exam demonstrates soft tissue swelling of the lateral hip. No warmth or erythema. No drainage or other signs of infection. Negative log roll. Hip ROM is within normal limits and nonpainful. 5/5 hip flexor strength. Distal neurovascular exam is intact.  Right hip exam demonstrates painful hip ROM with flexion, internal rotation and external rotation. Pain with log roll. Distal neurovascular exam intact.   Specialty Comments:  No specialty  comments available.  Imaging: Radiographs of the bilateral hips and pelvis dated 12/23/20 were reviewed and demonstrate stable left total hip arthroplasty, right hip degenerative changes.    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[place]) 08/24/2016   Status post left hip replacement 06/01/2016   Sinus tachycardia 05/19/2016   History of adenomatous polyp of colon 03/21/2016   Diabetes ([place]) 01/21/2016   Thyroid nodule 12/25/2014   Infiltrate noted on imaging study    Cervical spine arthritis 11/14/2014   Primary osteoarthritis of left hip 11/14/2014   Loss of consciousness ([place]) 11/14/2014   Chest pain 03/26/2014   Musculoskeletal chest and rib pain 03/26/2014   Rib pain on right side 02/28/2014   Groin pain 04/18/2013   UNSPECIFIED RENAL SCLEROSIS 01/16/2009   GERD, SEVERE 05/25/2007   Morbid obesity ([place]) 04/03/2007   Dyslipidemia 08/31/2006   Major depression, recurrent ([place]) 08/31/2006   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   Former smoker 08/31/2006   HYPERTENSION, BENIGN SYSTEMIC 08/31/2006   GASTRIC ULCER ACUTE WITHOUT HEMORRHAGE 08/31/2006   CONVULSIONS, SEIZURES, NOS 08/31/2006   Past Medical History:  Diagnosis Date   Allergy    Anemia    Anxiety    Arthritis    Back pain, chronic    Borderline diabetes    Bronchitis    Constipation    Cough    Depression    Epilepsy (HCC)    GERD (gastroesophageal reflux disease)    Hyperlipidemia    Hypertension    IBS (irritable bowel syndrome)    Joint pain    Obesity    Palpitations    Pneumonia 2016   Pre-diabetes    Renal sclerosis, unspecified    only has 1 kidney   Rheumatoid arthritis ([place])    Seizures ([place])    last >10 yrs ago- recorded 01/18/18   Somatization disorder    Tobacco abuse     Family History  Problem Relation Age of Onset   Other Mother        failed surgery   Heart Problems Father        poss MI, not sure:per pt   Alzheimer's disease Maternal Grandmother    Heart disease Maternal Grandfather    Colon cancer Maternal Uncle        not sure age of onset    Past Surgical History:  Procedure Laterality Date   ABDOMINAL HYSTERECTOMY     ANTERIOR HIP REVISION Left 07/25/2016   Procedure: LEFT HIP  IRRIGATION AND DEBRIDEMENT, REVISION OF HEAD AND LINER;  Surgeon: Leandrew Koyanagi, MD;  Location: [facility];  Service: Orthopedics;  Laterality: Left;   APPLICATION OF WOUND VAC Left 12/23/2019   Procedure: APPLICATION OF WOUND VAC;  Surgeon: Leandrew Koyanagi, MD;  Location: [facility];  Service: Orthopedics;  Laterality: Left;   BREAST EXCISIONAL BIOPSY Right    CHOLECYSTECTOMY     COLONOSCOPY     DEBRIDEMENT AND CLOSURE WOUND Left 07/17/2019   Procedure: Excision of left leg wound with ACell placement and primary  closure;  Surgeon: Wallace Going, DO;  Location: [facility];  Service: Plastics;  Laterality: Left;  60 min   EXCISIONAL TOTAL HIP ARTHROPLASTY WITH ANTIBIOTIC SPACERS Left 12/23/2019   Procedure: LEFT HIP IRRIGATION AND DEBRIDEMENT LEFT HIP WOUND and poly exchange ;  Surgeon: Leandrew Koyanagi, MD;  Location: [facility];  Service: Orthopedics;  Laterality: Left;   HIP DEBRIDEMENT Left 01/19/2018   Procedure(s) Performed: IRRIGATION AND DEBRIDEMENT LEFT HIP (Left )   INCISION AND DRAINAGE HIP Left 08/03/2016   Procedure: IRRIGATION AND DEBRIDEMENT LEFT HIP, VAC PLACEMENT;  Surgeon: Leandrew Koyanagi, MD;  Location: [facility];  Service: Orthopedics;  Laterality: Left;   INCISION AND DRAINAGE HIP Left 01/19/2018   Procedure: IRRIGATION AND DEBRIDEMENT LEFT HIP;  Surgeon: Leandrew Koyanagi, MD;  Location: [facility];  Service: Orthopedics;  Laterality: Left;   kidney stent Right 2002   TONSILLECTOMY Bilateral 12/06/2014   Procedure: TONSILLECTOMY, BILATERAL;  Surgeon: Ruby Cola, MD;  Location: [facility];  Service: ENT;  Laterality: Bilateral;   TONSILLECTOMY     TOTAL HIP ARTHROPLASTY Left 06/01/2016   Procedure: LEFT TOTAL HIP ARTHROPLASTY ANTERIOR APPROACH;  Surgeon: Leandrew Koyanagi, MD;  Location: [facility];  Service: Orthopedics;  Laterality: Left;   TUBAL LIGATION     WOUND EXPLORATION Left 01/19/2018   Procedure: WOUND EXPLORATION;  Surgeon: Leandrew Koyanagi, MD;  Location: [facility];  Service: Orthopedics;  Laterality:  Left;   Social History   Occupational History   Occupation: Not Working  Tobacco Use   Smoking status: Former    Packs/day: 0.25    Years: 0.50    Pack years: 0.13    Types: Cigarettes    Quit date: 01/27/2014    Years since quitting: 6.9   Smokeless tobacco: Never  Vaping Use   Vaping Use: Never used  Substance and Sexual Activity   Alcohol use: No   Drug use: No   Sexual activity: Not on file

## 2020-12-24 ENCOUNTER — Other Ambulatory Visit: Payer: Self-pay

## 2020-12-24 ENCOUNTER — Ambulatory Visit
Admission: RE | Admit: 2020-12-24 | Discharge: 2020-12-24 | Disposition: A | Discharge status: Home / Self Care | Payer: Medicaid Other | Source: Ambulatory Visit | Attending: Obstetrics & Gynecology | Admitting: Obstetrics & Gynecology

## 2020-12-24 DIAGNOSIS — Z1231 Encounter for screening mammogram for malignant neoplasm of breast: Secondary | ICD-10-CM

## 2020-12-28 ENCOUNTER — Ambulatory Visit: Payer: Self-pay

## 2020-12-28 ENCOUNTER — Ambulatory Visit (INDEPENDENT_AMBULATORY_CARE_PROVIDER_SITE_OTHER): Payer: Medicaid Other | Admitting: Family Medicine

## 2020-12-28 ENCOUNTER — Encounter: Payer: Self-pay | Admitting: Family Medicine

## 2020-12-28 ENCOUNTER — Other Ambulatory Visit: Payer: Self-pay

## 2020-12-28 DIAGNOSIS — M25551 Pain in right hip: Secondary | ICD-10-CM | POA: Diagnosis not present

## 2020-12-28 NOTE — Progress Notes (Signed)
Subjective: Patient is here for ultrasound-guided intra-articular right hip injection.   Pain from DJD.  Objective:  Pain with IR.  Procedure: Ultrasound guided injection is preferred based studies that show increased duration, increased effect, greater accuracy, decreased procedural pain, increased response rate, and decreased cost with ultrasound guided versus blind injection.   Verbal informed consent obtained.  Time-out conducted.  Noted no overlying erythema, induration, or other signs of local infection. Ultrasound-guided right hip injection: After sterile prep with Betadine, injected 4 cc 0.25% bupivacaine without epinephrine and 6 mg betamethasone using a 22-gauge spinal needle, passing the needle through the iliofemoral ligament into the femoral head/neck junction.  Injectate seen filling joint capsule.

## 2021-01-05 DIAGNOSIS — F411 Generalized anxiety disorder: Secondary | ICD-10-CM | POA: Diagnosis not present

## 2021-01-07 ENCOUNTER — Other Ambulatory Visit: Payer: Self-pay | Admitting: Family Medicine

## 2021-01-14 ENCOUNTER — Inpatient Hospital Stay: Admission: RE | Admit: 2021-01-14 | Payer: Medicaid Other | Source: Ambulatory Visit

## 2021-01-14 ENCOUNTER — Ambulatory Visit
Admission: RE | Admit: 2021-01-14 | Discharge: 2021-01-14 | Disposition: A | Discharge status: Home / Self Care | Payer: Medicaid Other | Source: Ambulatory Visit | Attending: Orthopaedic Surgery | Admitting: Orthopaedic Surgery

## 2021-01-14 ENCOUNTER — Other Ambulatory Visit: Payer: Self-pay

## 2021-01-14 DIAGNOSIS — M25452 Effusion, left hip: Secondary | ICD-10-CM | POA: Diagnosis not present

## 2021-01-14 DIAGNOSIS — Z96642 Presence of left artificial hip joint: Secondary | ICD-10-CM | POA: Diagnosis not present

## 2021-01-14 DIAGNOSIS — M25552 Pain in left hip: Secondary | ICD-10-CM | POA: Diagnosis not present

## 2021-01-14 DIAGNOSIS — Z471 Aftercare following joint replacement surgery: Secondary | ICD-10-CM | POA: Diagnosis not present

## 2021-01-14 DIAGNOSIS — T8452XD Infection and inflammatory reaction due to internal left hip prosthesis, subsequent encounter: Secondary | ICD-10-CM

## 2021-01-14 MED ORDER — IOPAMIDOL (ISOVUE-300) INJECTION 61%
100.0000 mL | Freq: Once | INTRAVENOUS | Status: AC | PRN
  Administered 2021-01-14: 100 mL via INTRAVENOUS

## 2021-01-18 ENCOUNTER — Encounter: Payer: Self-pay | Admitting: Gastroenterology

## 2021-01-20 DIAGNOSIS — F331 Major depressive disorder, recurrent, moderate: Secondary | ICD-10-CM | POA: Diagnosis not present

## 2021-01-20 DIAGNOSIS — F4310 Post-traumatic stress disorder, unspecified: Secondary | ICD-10-CM | POA: Diagnosis not present

## 2021-01-27 ENCOUNTER — Other Ambulatory Visit: Payer: Self-pay

## 2021-01-27 ENCOUNTER — Ambulatory Visit (INDEPENDENT_AMBULATORY_CARE_PROVIDER_SITE_OTHER): Payer: Medicaid Other | Admitting: Internal Medicine

## 2021-01-27 ENCOUNTER — Encounter: Payer: Self-pay | Admitting: Internal Medicine

## 2021-01-27 DIAGNOSIS — T8452XD Infection and inflammatory reaction due to internal left hip prosthesis, subsequent encounter: Secondary | ICD-10-CM

## 2021-01-27 NOTE — Progress Notes (Signed)
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[place]) 08/24/2016    Priority: High   Rash 09/18/2020   Worsening headaches 01/21/2020   Pruritus 10/30/2019   Hypercalcemia 10/24/2019   Body mass index 45.0-49.9, adult ([place]) 08/27/2019   Intertrigo 07/09/2019   Facial rash 07/27/2018   Body mass index 40.0-44.9, adult ([place]) 02/15/2018   Fatigue associated with anemia 02/07/2018   Unspecified open wound, left hip, initial encounter 01/19/2018   Wound of left leg, sequela 10/03/2016   Status post left hip replacement 06/01/2016   Sinus tachycardia 05/19/2016   History of adenomatous polyp of colon 03/21/2016   Diabetes ([place]) 01/21/2016   Thyroid nodule 12/25/2014   Infiltrate noted on imaging study    Cervical spine arthritis 11/14/2014   Primary osteoarthritis of left hip 11/14/2014   Loss of consciousness ([place]) 11/14/2014   Chest pain 03/26/2014   Musculoskeletal chest and rib pain 03/26/2014   Rib pain on right side 02/28/2014   Groin pain 04/18/2013   UNSPECIFIED RENAL SCLEROSIS 01/16/2009   GERD, SEVERE 05/25/2007   Morbid obesity ([place]) 04/03/2007   Dyslipidemia 08/31/2006   Major depression, recurrent ([place]) 08/31/2006   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   AMITRIPTYLINE (ELAVIL) 75 MG TABLET    Take 2 tablets (150 mg total) by mouth at bedtime.   AMOXICILLIN (AMOXIL) 500 MG TABLET    Take 1,000 mg by mouth 2 (two) times daily.   ASCORBIC ACID (VITAMIN C) 250 MG CHEW    Chew 250 mg by mouth daily.   CHOLECALCIFEROL  (VITAMIN D3) 25 MCG (1000 UNIT) TABLET    Take 1,000 Units by mouth daily.   CYCLOBENZAPRINE (FLEXERIL) 10 MG TABLET    Take 0.5 tablets (5 mg total) by mouth at bedtime.   DIPHENHYDRAMINE-ZINC ACETATE (BENADRYL) CREAM    Apply 1 application topically 3 (three) times daily as needed for itching.   DOXYCYCLINE (VIBRA-TABS) 100 MG TABLET    Take 1 tablet (100 mg total) by mouth 2 (two) times daily.   FERROUS SULFATE 325 (65 FE) MG TABLET    Take 1 tablet (325 mg total) by mouth 2 (two) times daily.   HYDROCHLOROTHIAZIDE (HYDRODIURIL) 25 MG TABLET    TAKE ONE TABLET BY MOUTH EVERY DAY   HYDROCODONE-ACETAMINOPHEN (NORCO/VICODIN) 5-325 MG TABLET    TAKE 1 TABLET BY MOUTH EVERY 4 TO 6 HOURS AS NEEDED FOR PAIN   IBUPROFEN (ADVIL) 200 MG TABLET    Take 400 mg by mouth every 6 (six) hours as needed for moderate pain.   LEVOCETIRIZINE (XYZAL) 5 MG TABLET    Take 1 tablet (5 mg total) by mouth every evening.   METFORMIN (GLUCOPHAGE) 500 MG TABLET    TAKE ONE TABLET BY MOUTH TWICE DAILY   METOPROLOL TARTRATE (LOPRESSOR) 25 MG TABLET    Take 1 tablet (25 mg total) by mouth 2 (two) times daily.   MUPIROCIN CREAM (BACTROBAN) 2 %    Apply 1 application topically 2 (two) times daily.  NYSTATIN (MYCOSTATIN/NYSTOP) POWDER    Apply 1 application topically 2 (two) times daily. Apply to affected skin fold   OMEPRAZOLE (PRILOSEC) 20 MG CAPSULE    Take 20 mg by mouth at bedtime.   SIMVASTATIN (ZOCOR) 20 MG TABLET    Take 1 tablet (20 mg total) by mouth at bedtime.   TRAZODONE (DESYREL) 50 MG TABLET    Take 25-50 mg by mouth at bedtime as needed.   ZINC GLUCONATE 50 MG TABLET    Take 50 mg by mouth daily.  Modified Medications   No medications on file  Discontinued Medications   No medications on file    Subjective: Kristin [name] is in for her hospital follow-up visit.  She is a 65 y.o. female who underwent left total hip arthroplasty on 06/01/2016.  She developed acute infection with MRSA in January 2018.  She underwent  incision and drainage with polyexchange and was discharged on IV vancomycin before converting to oral doxycycline.  She completed therapy in June 2018.  She has had intermittent boils form along her left hip incision since that time.  She has been treated with oral trimethoprim sulfamethoxazole.  She underwent scar revision on 01/19/2018.  No organisms were seen on Gram stain and cultures were negative.  Dr. Lyndee Leo Dillingham excised an 8 cm deep fistula on 07/17/2019. There was no evidence of infection noted in the operative report and no cultures were obtained.  A CT scan this past February showed some sclerosis and fluid around the prosthesis.  An x-ray on 11/12/2019 showed lucency around the femoral stem.  She stopped taking trimethoprim sulfamethoxazole on 12/13/2019 and was admitted on 12/23/2019 with a plan for probable excision arthroplasty.  At the time of surgery the prosthesis was noted to be firmly in place with sclerotic bone.  She underwent debridement and polyexchange again.  No organisms were seen on stain and 1 [id number] operative cultures grew rare Prevotella after she was discharged.  Initially she was sent her home on IV vancomycin and rifampin but I added metronidazole after discharge.  She completed 6 weeks of IVantibiotic therapy on 02/05/2020 before switching to oral doxycycline.  She has not had any problems tolerating doxycycline.    She says that her pain is usually around 8 out of 10.  Over the last few months she has noted increased swelling over her left lateral hip.  She has not noted any unusual redness or warmth.  She has not had any fever, chills or sweats.  She saw her orthopedic surgeon, Dr. MEduard Roux recently who ordered a repeat CT scan.  It was done on 01/14/2021 and showed:  IMPRESSION: 1. 8.2 x 5.3 cm thick rimmed fluid collection in the subcutaneous fat overlying the region of the left greater trochanter. This could be a liquified hematoma or abscess. 2. There is marked lucency  along bone prosthesis interface at the acetabular roof which appears relatively stable when compared to a prior CT scan from 08/09/2019 and may represent chronic loosening and or particle disease. There is also mild lucency along the lateral femoral prosthesis which appears stable. 3. Left hip joint effusion and or synovitis.  Review of Systems: Review of Systems  Constitutional:  Negative for fever.  Gastrointestinal:  Negative for abdominal pain, diarrhea, nausea and vomiting.  Musculoskeletal:  Positive for joint pain.   Past Medical History:  Diagnosis Date   Allergy    Anemia    Anxiety    Arthritis    Back pain, chronic  Borderline diabetes    Bronchitis    Constipation    Cough    Depression    Epilepsy (HCC)    GERD (gastroesophageal reflux disease)    Hyperlipidemia    Hypertension    IBS (irritable bowel syndrome)    Joint pain    Obesity    Palpitations    Pneumonia 2016   Pre-diabetes    Renal sclerosis, unspecified    only has 1 kidney   Rheumatoid arthritis ([place])    Seizures ([place])    last >10 yrs ago- recorded 01/18/18   Somatization disorder    Tobacco abuse     Social History   Tobacco Use   Smoking status: Former    Packs/day: 0.25    Years: 0.50    Pack years: 0.13    Types: Cigarettes    Quit date: 01/27/2014    Years since quitting: 7.0   Smokeless tobacco: Never  Vaping Use   Vaping Use: Never used  Substance Use Topics   Alcohol use: No   Drug use: No    Family History  Problem Relation Age of Onset   Other Mother        failed surgery   Heart Problems Father        poss MI, not sure:per pt   Colon cancer Father    Alzheimer's disease Maternal Grandmother    Heart disease Maternal Grandfather    Colon cancer Maternal Uncle        not sure age of onset   Colon cancer Other        grandfather    Allergies  Allergen Reactions   Fluconazole Swelling and Other (See Comments)    Face swelling   Robitussin (Alcohol Free)  [Guaifenesin] Palpitations and Other (See Comments)    Chest pain    Objective: Vitals:   01/27/21 1105  BP: 128/81  Pulse: 85  Temp: 97.6 F (36.4 C)  TempSrc: Oral  SpO2: 99%  Weight: 253 lb (114.8 kg)   Body mass index is 46.27 kg/m.  Physical Exam Constitutional:      Comments: She is in good spirits.  Cardiovascular:     Rate and Rhythm: Normal rate.  Pulmonary:     Effort: Pulmonary effort is normal.  Musculoskeletal:        General: Swelling present. No tenderness.     Comments: There has been a marked increase in swelling over the left lateral hip.  There is no unusual erythema or warmth.  The area of swelling is fluctuant.  Neurological:     Gait: Gait normal.  Psychiatric:        Mood and Affect: Mood normal.    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  We will need to review the CT findings with [name] to determine the best option at this point.  She may need repeat I&D or at the very least aspiration of the fluid collection by radiology.  She will follow-up with me next week.  Michel Bickers, MD [facility] for [facility] [phone] pager   [phone] cell 01/27/2021, 12:35 PM

## 2021-01-27 NOTE — Assessment & Plan Note (Signed)
I am worried that her prosthetic hip infection is worsening with formation of a new soft tissue abscess.  We will need to review the CT findings with [name] to determine the best option at this point.  She may need repeat I&D or at the very least aspiration of the fluid collection by radiology.  She will follow-up with me next week.

## 2021-01-29 ENCOUNTER — Other Ambulatory Visit: Payer: Self-pay | Admitting: Family Medicine

## 2021-02-03 ENCOUNTER — Other Ambulatory Visit: Payer: Self-pay

## 2021-02-03 ENCOUNTER — Telehealth (INDEPENDENT_AMBULATORY_CARE_PROVIDER_SITE_OTHER): Payer: Medicaid Other | Admitting: Internal Medicine

## 2021-02-03 DIAGNOSIS — T8452XA Infection and inflammatory reaction due to internal left hip prosthesis, initial encounter: Secondary | ICD-10-CM

## 2021-02-03 DIAGNOSIS — T8452XD Infection and inflammatory reaction due to internal left hip prosthesis, subsequent encounter: Secondary | ICD-10-CM

## 2021-02-03 NOTE — Progress Notes (Signed)
Virtual Visit via Telephone Note  I connected with Kristin [name] on 02/03/21 at 10:45 AM EDT by telephone and verified that I am speaking with the correct person using two identifiers.  Location: Patient: Home Provider: [facility]   I discussed the limitations, risks, security and privacy concerns of performing an evaluation and management service by telephone and the availability of in person appointments. I also discussed with the patient that there may be a patient responsible charge related to this service. The patient expressed understanding and agreed to proceed.   History of Present Illness: I called and spoke with Kristin [name] today.  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  She has had increased swelling over her left hip in the last few months.  A CT scan performed on 01/14/2021 revealed:  IMPRESSION: 1. 8.2 x 5.3 cm thick rimmed fluid collection in the subcutaneous fat overlying the region of the left greater trochanter. This could be a liquified hematoma or abscess. 2. There is marked lucency along bone prosthesis interface at the acetabular roof which appears relatively stable when compared to a prior CT scan from 08/09/2019 and may represent chronic loosening and or particle disease. There is also mild lucency along the lateral femoral prosthesis which appears stable. 3. Left hip joint effusion and or synovitis.  Observations/Objective:   Assessment and Plan: She is scheduled to follow-up with her orthopedic surgeon, Dr. Eduard Roux, tomorrow morning.  It appears that she has a recurrent soft tissue abscess.  If fluid is obtained by aspiration or I&D should be submitted for gram stain, aerobic and anaerobic cultures to help determine if any changes need to be made in her chronic antibiotic therapy.  Follow Up Instructions: Continue doxycycline for now Follow-up in 1 week   I  discussed the assessment and treatment plan with the patient. The patient was provided an opportunity to ask questions and all were answered. The patient agreed with the plan and demonstrated an understanding of the instructions.   The patient was advised to call back or seek an in-person evaluation if the symptoms worsen or if the condition fails to improve as anticipated.  I provided 14 minutes of non-face-to-face time during this encounter.   Michel Bickers, MD

## 2021-02-04 ENCOUNTER — Encounter: Payer: Self-pay | Admitting: Orthopaedic Surgery

## 2021-02-04 ENCOUNTER — Ambulatory Visit (INDEPENDENT_AMBULATORY_CARE_PROVIDER_SITE_OTHER): Payer: Medicaid Other | Admitting: Orthopaedic Surgery

## 2021-02-04 ENCOUNTER — Other Ambulatory Visit: Payer: Self-pay

## 2021-02-04 ENCOUNTER — Ambulatory Visit: Payer: Self-pay

## 2021-02-04 DIAGNOSIS — T8452XD Infection and inflammatory reaction due to internal left hip prosthesis, subsequent encounter: Secondary | ICD-10-CM

## 2021-02-04 NOTE — Addendum Note (Signed)
Addended by: Marlyne Beards on: 02/04/2021 08:47 AM   Modules accepted: Orders

## 2021-02-04 NOTE — Progress Notes (Signed)
Subjective: She is here for left hip aspiration.  Objective: She has some tenderness laterally.  Procedure: Ultrasound-guided aspiration: After sterile prep with Betadine, injected 3 cc 1% lidocaine without epinephrine, then aspirated 140 cc of blood-tinged fluid from the lateral pocket of fluid.  We will send it for cell count and differential, culture and sensitivity.

## 2021-02-04 NOTE — Progress Notes (Signed)
Office Visit Note   Patient: Kristin [name]           Date of Birth: 11-28-1955           MRN: [id number] Visit Date: 02/04/2021              Requested by: Lyndee Hensen, DO [place],  [place] [address] PCP: Lyndee Hensen, DO   Assessment & Plan: Visit Diagnoses:  1. 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  Given the new findings on CT we will have this aspirated under ultrasound by Dr. Junius Roads.  We will have the patient follow-up with me once we have the results of the fluid to further discuss treatment options.  Follow-Up Instructions: No follow-ups on file.   Orders:  No orders of the defined types were placed in this encounter.  No orders of the defined types were placed in this encounter.     Procedures: No procedures performed   Clinical Data: No additional findings.   Subjective: Chief Complaint  Patient presents with   Left Hip - Pain    HPI Karima returns today for CT scan review of the left hip.  Denies any changes.  Review of Systems   Objective: Vital Signs: There were no vitals taken for this visit.  Physical Exam  Ortho Exam Left hip exam is unchanged. Specialty Comments:  No specialty comments available.  Imaging: No results found.   PMFS History: Patient Active Problem List   Diagnosis Date Noted   Rash 09/18/2020   Worsening headaches 01/21/2020   Pruritus 10/30/2019   Hypercalcemia 10/24/2019   Body mass index 45.0-49.9, adult ([place]) 08/27/2019   Intertrigo 07/09/2019   Facial rash 07/27/2018   Body mass index 40.0-44.9, adult (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[place]) 08/24/2016   Status post left hip replacement 06/01/2016   Sinus tachycardia 05/19/2016   History of adenomatous polyp of colon 03/21/2016   Diabetes ([place]) 01/21/2016   Thyroid nodule 12/25/2014   Infiltrate noted on imaging study    Cervical spine arthritis 11/14/2014   Primary osteoarthritis of left hip 11/14/2014   Loss of consciousness ([place]) 11/14/2014   Chest pain 03/26/2014   Musculoskeletal chest and rib pain 03/26/2014   Rib pain on right side 02/28/2014   Groin pain 04/18/2013   UNSPECIFIED RENAL SCLEROSIS 01/16/2009   GERD, SEVERE 05/25/2007   Morbid obesity ([place]) 04/03/2007   Dyslipidemia 08/31/2006   Major depression, recurrent ([facility]) 08/31/2006   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   Former smoker 08/31/2006   HYPERTENSION, BENIGN SYSTEMIC 08/31/2006   GASTRIC ULCER ACUTE WITHOUT HEMORRHAGE 08/31/2006   CONVULSIONS, SEIZURES, NOS 08/31/2006   Past Medical History:  Diagnosis Date   Allergy    Anemia    Anxiety    Arthritis    Back pain, chronic    Borderline diabetes    Bronchitis    Constipation    Cough    Depression    Epilepsy ([place])    GERD (gastroesophageal reflux disease)    Hyperlipidemia  Hypertension    IBS (irritable bowel syndrome)    Joint pain    Obesity    Palpitations    Pneumonia 2016   Pre-diabetes    Renal sclerosis, unspecified    only has 1 kidney   Rheumatoid arthritis ([place])    Seizures ([facility])    last >10 yrs ago- recorded 01/18/18   Somatization disorder    Tobacco abuse     Family History  Problem Relation Age of Onset   Other Mother        failed surgery   Heart Problems Father        poss MI, not sure:per pt   Colon cancer Father    Alzheimer's disease Maternal Grandmother    Heart disease Maternal Grandfather    Colon cancer Maternal Uncle        not sure age of onset   Colon cancer Other         grandfather    Past Surgical History:  Procedure Laterality Date   ABDOMINAL HYSTERECTOMY     ANTERIOR HIP REVISION Left 07/25/2016   Procedure: LEFT HIP IRRIGATION AND DEBRIDEMENT, REVISION OF HEAD AND LINER;  Surgeon: Leandrew Koyanagi, MD;  Location: [facility];  Service: Orthopedics;  Laterality: Left;   APPLICATION OF WOUND VAC Left 12/23/2019   Procedure: APPLICATION OF WOUND VAC;  Surgeon: Leandrew Koyanagi, MD;  Location: [facility];  Service: Orthopedics;  Laterality: Left;   BREAST EXCISIONAL BIOPSY Right    CHOLECYSTECTOMY     COLONOSCOPY     DEBRIDEMENT AND CLOSURE WOUND Left 07/17/2019   Procedure: Excision of left leg wound with ACell placement and primary closure;  Surgeon: Wallace Going, DO;  Location: [facility];  Service: Plastics;  Laterality: Left;  60 min   EXCISIONAL TOTAL HIP ARTHROPLASTY WITH ANTIBIOTIC SPACERS Left 12/23/2019   Procedure: LEFT HIP IRRIGATION AND DEBRIDEMENT LEFT HIP WOUND and poly exchange ;  Surgeon: Leandrew Koyanagi, MD;  Location: [facility];  Service: Orthopedics;  Laterality: Left;   HIP DEBRIDEMENT Left 01/19/2018   Procedure(s) Performed: IRRIGATION AND DEBRIDEMENT LEFT HIP (Left )   INCISION AND DRAINAGE HIP Left 08/03/2016   Procedure: IRRIGATION AND DEBRIDEMENT LEFT HIP, VAC PLACEMENT;  Surgeon: Leandrew Koyanagi, MD;  Location: [facility];  Service: Orthopedics;  Laterality: Left;   INCISION AND DRAINAGE HIP Left 01/19/2018   Procedure: IRRIGATION AND DEBRIDEMENT LEFT HIP;  Surgeon: Leandrew Koyanagi, MD;  Location: [facility];  Service: Orthopedics;  Laterality: Left;   kidney stent Right 2002   TONSILLECTOMY Bilateral 12/06/2014   Procedure: TONSILLECTOMY, BILATERAL;  Surgeon: Ruby Cola, MD;  Location: [facility];  Service: ENT;  Laterality: Bilateral;   TONSILLECTOMY     TOTAL HIP ARTHROPLASTY Left 06/01/2016   Procedure: LEFT TOTAL HIP ARTHROPLASTY ANTERIOR APPROACH;  Surgeon: Leandrew Koyanagi, MD;  Location: [facility];  Service: Orthopedics;  Laterality: Left;   TUBAL  LIGATION     WOUND EXPLORATION Left 01/19/2018   Procedure: WOUND EXPLORATION;  Surgeon: Leandrew Koyanagi, MD;  Location: [facility];  Service: Orthopedics;  Laterality: Left;   Social History   Occupational History   Occupation: Not Working  Tobacco Use   Smoking status: Former    Packs/day: 0.25    Years: 0.50    Pack years: 0.13    Types: Cigarettes    Quit date: 01/27/2014    Years since quitting: 7.0   Smokeless tobacco: Never  Vaping Use   Vaping  Use: Never used  Substance and Sexual Activity   Alcohol use: No   Drug use: No   Sexual activity: Not on file

## 2021-02-04 NOTE — Addendum Note (Signed)
Addended by: Marlyne Beards on: 02/04/2021 09:33 AM   Modules accepted: Orders

## 2021-02-09 ENCOUNTER — Telehealth: Payer: Self-pay | Admitting: Orthopaedic Surgery

## 2021-02-09 NOTE — Telephone Encounter (Signed)
Please check.  Thanks.

## 2021-02-09 NOTE — Telephone Encounter (Signed)
Pt calling wanting to know if the results of the tests she had done at her previous appt with [name]. The best call back number is  [phone].

## 2021-02-10 ENCOUNTER — Ambulatory Visit (INDEPENDENT_AMBULATORY_CARE_PROVIDER_SITE_OTHER): Payer: Medicaid Other | Admitting: Internal Medicine

## 2021-02-10 ENCOUNTER — Encounter: Payer: Self-pay | Admitting: Internal Medicine

## 2021-02-10 ENCOUNTER — Other Ambulatory Visit: Payer: Self-pay

## 2021-02-10 DIAGNOSIS — T8452XD Infection and inflammatory reaction due to internal left hip prosthesis, subsequent encounter: Secondary | ICD-10-CM

## 2021-02-10 LAB — CELL COUNT + DIFF, W/O CRYST-SYNVL FLD
Basophils, %: 0 %
Eosinophils-Synovial: 0 % (ref 0–2)
Lymphocytes-Synovial Fld: 50 % (ref 0–74)
Monocyte/Macrophage: 28 % (ref 0–69)
Neutrophil, Synovial: 22 % (ref 0–24)
Synoviocytes, %: 0 % (ref 0–15)
WBC, Synovial: 776 cells/uL — ABNORMAL HIGH (ref <150)

## 2021-02-10 LAB — ANAEROBIC AND AEROBIC CULTURE
AER RESULT:: NO GROWTH
MICRO NUMBER:: 12204664
MICRO NUMBER:: 12204665
SPECIMEN QUALITY:: ADEQUATE
SPECIMEN QUALITY:: ADEQUATE

## 2021-02-10 MED ORDER — ACETAMINOPHEN-CODEINE #3 300-30 MG PO TABS: 1.0000 | ORAL_TABLET | Freq: Every day | ORAL | 0 refills | Status: AC | PRN

## 2021-02-10 NOTE — Telephone Encounter (Signed)
Spoke to patient tonight.

## 2021-02-10 NOTE — Progress Notes (Signed)
Virtual Visit via Telephone Note  I connected with Kristin [name] on 02/10/21 at  9:45 AM EDT by telephone and verified that I am speaking with the correct person using two identifiers.  Location: Patient: Home Provider: [facility]   I discussed the limitations, risks, security and privacy concerns of performing an evaluation and management service by telephone and the availability of in person appointments. I also discussed with the patient that there may be a patient responsible charge related to this service. The patient expressed understanding and agreed to proceed.   History of Present Illness: I called and spoke with Neoma Laming today.  She saw [name] on 02/04/2021 who referred her to his partner Dr. Junius Roads who aspirated 140 cc of blood tinged fluid from the subcutaneous fluid collection noted on her recent CT scan adjacent to her left hip prosthesis.  No organisms were seen on gram stain.  Final cultures were negative.  There were 776 white blood cells of which 50% were lymphocytes.  There were no crystals seen.  She says that she did not notice much change in the swelling after the aspiration and has not noted any increased swelling over the past week.   Observations/Objective: Aspirate gram stain and cultures are negative  Assessment and Plan: I suspect that the recent reaccumulation of fluid is due to smoldering MRSA prosthetic hip infection.  If it continues to increase we will need to discuss whether or not she is a candidate for excision arthroplasty with [name].  Follow Up Instructions: Continue doxycycline for now Follow-up in 6 weeks   I discussed the assessment and treatment plan with the patient. The patient was provided an opportunity to ask questions and all were answered. The patient agreed with the plan and demonstrated an understanding of the instructions.   The patient was advised to call back or seek an in-person evaluation if the symptoms worsen or if the condition fails to  improve as anticipated.  I provided 15 minutes of non-face-to-face time during this encounter.   Michel Bickers, MD

## 2021-02-22 ENCOUNTER — Ambulatory Visit: Payer: Medicaid Other | Admitting: Family Medicine

## 2021-02-22 NOTE — Progress Notes (Deleted)
    SUBJECTIVE:   CHIEF COMPLAINT / HPI:   Bilateral lower edema:  65 year old female presenting with the above.  States this started***.  Denies shortness of breath***, orthopnea, paroxysmal nocturnal dyspnea, or chest pain***.  Prediabetes: Patient has not had A1c in 1 year.  Last A1c of 6.0.  Currently takes metformin 500 mg daily.  PERTINENT  PMH / PSH: ***  OBJECTIVE:   There were no vitals taken for this visit. ***  General: NAD, pleasant, able to participate in exam Cardiac: RRR, no murmurs. Respiratory: CTAB, normal effort, No wheezes, rales or rhonchi Abdomen: Bowel sounds present, nontender, nondistended, no hepatosplenomegaly. Extremities: no edema or cyanosis. Skin: warm and dry, no rashes noted Neuro: alert, no obvious focal deficits Psych: Normal affect and mood  ASSESSMENT/PLAN:   No problem-specific Assessment & Plan notes found for this encounter.   We will check an A1c and a urine microalbumin per recommendation.  We will have patient follow-up with her PCP in 2 to 3 weeks ***.  Kristin [name], [place]    {    This will disappear when note is signed, click to select method of visit    :1}

## 2021-02-24 DIAGNOSIS — F331 Major depressive disorder, recurrent, moderate: Secondary | ICD-10-CM | POA: Diagnosis not present

## 2021-02-25 ENCOUNTER — Other Ambulatory Visit: Payer: Self-pay | Admitting: Internal Medicine

## 2021-02-25 DIAGNOSIS — T8452XD Infection and inflammatory reaction due to internal left hip prosthesis, subsequent encounter: Secondary | ICD-10-CM

## 2021-03-02 ENCOUNTER — Other Ambulatory Visit: Payer: Self-pay | Admitting: Internal Medicine

## 2021-03-02 DIAGNOSIS — T8452XD Infection and inflammatory reaction due to internal left hip prosthesis, subsequent encounter: Secondary | ICD-10-CM

## 2021-03-04 DIAGNOSIS — F331 Major depressive disorder, recurrent, moderate: Secondary | ICD-10-CM | POA: Diagnosis not present

## 2021-03-16 ENCOUNTER — Encounter: Payer: Self-pay | Admitting: Gastroenterology

## 2021-03-16 DIAGNOSIS — F331 Major depressive disorder, recurrent, moderate: Secondary | ICD-10-CM | POA: Diagnosis not present

## 2021-03-30 ENCOUNTER — Encounter: Payer: Self-pay | Admitting: Internal Medicine

## 2021-03-30 ENCOUNTER — Ambulatory Visit (INDEPENDENT_AMBULATORY_CARE_PROVIDER_SITE_OTHER): Payer: Medicaid Other | Admitting: Internal Medicine

## 2021-03-30 ENCOUNTER — Other Ambulatory Visit: Payer: Self-pay

## 2021-03-30 DIAGNOSIS — T8452XD Infection and inflammatory reaction due to internal left hip prosthesis, subsequent encounter: Secondary | ICD-10-CM | POA: Diagnosis not present

## 2021-03-30 NOTE — Progress Notes (Signed)
Virtual Visit via Telephone Note  I connected with Kristin [name] on 03/30/21 at  2:00 PM EDT by telephone and verified that I am speaking with the correct person using two identifiers.  Location: Patient: Home Provider: [facility]   I discussed the limitations, risks, security and privacy concerns of performing an evaluation and management service by telephone and the availability of in person appointments. I also discussed with the patient that there may be a patient responsible charge related to this service. The patient expressed understanding and agreed to proceed.   History of Present Illness: I called and spoke with Kristin [name] today.  She saw [name] on 02/04/2021 who referred her to his partner Dr. Junius Roads who aspirated 140 cc of blood tinged fluid from the subcutaneous fluid collection noted on her recent CT scan adjacent to her left hip prosthesis.  No organisms were seen on gram stain.  Final cultures were negative.  There were 776 white blood cells of which 50% were lymphocytes.  There were no crystals seen.  She says that she did not notice much change in the swelling after the aspiration.  She has not had any increased swelling, redness, pain or warmth in her left hip.  She continues to tolerate doxycycline well.   Observations/Objective: Sed Rate (mm/h)  Date Value  04/23/2020 58 (H)  03/12/2020 82 (H)  01/18/2018 79 (H)   CRP (mg/L)  Date Value  04/23/2020 47.8 (H)  03/12/2020 42.7 (H)  01/18/2018 41.4 (H)    Sed Rate (mm/h)  Date Value  04/23/2020 58 (H)  03/12/2020 82 (H)  01/18/2018 79 (H)   CRP (mg/L)  Date Value  04/23/2020 47.8 (H)  03/12/2020 42.7 (H)  01/18/2018 41.4 (H)     Assessment and Plan: I strongly suspect that her recent reaccumulation of fluid was due to smoldering MRSA prosthetic hip infection.  She is currently stable and tolerating doxycycline well.  Follow Up Instructions: Continue long-term, suppressive doxycycline Follow-up in 3 months   I  discussed the assessment and treatment plan with the patient. The patient was provided an opportunity to ask questions and all were answered. The patient agreed with the plan and demonstrated an understanding of the instructions.   The patient was advised to call back or seek an in-person evaluation if the symptoms worsen or if the condition fails to improve as anticipated.  I provided 16 minutes of non-face-to-face time during this encounter.   Michel Bickers, MD

## 2021-04-09 DIAGNOSIS — F4310 Post-traumatic stress disorder, unspecified: Secondary | ICD-10-CM | POA: Diagnosis not present

## 2021-04-20 ENCOUNTER — Other Ambulatory Visit: Payer: Self-pay

## 2021-04-20 ENCOUNTER — Ambulatory Visit (INDEPENDENT_AMBULATORY_CARE_PROVIDER_SITE_OTHER): Payer: Medicaid Other

## 2021-04-20 DIAGNOSIS — Z23 Encounter for immunization: Secondary | ICD-10-CM | POA: Diagnosis not present

## 2021-04-28 DIAGNOSIS — F4310 Post-traumatic stress disorder, unspecified: Secondary | ICD-10-CM | POA: Diagnosis not present

## 2021-04-28 DIAGNOSIS — F411 Generalized anxiety disorder: Secondary | ICD-10-CM | POA: Diagnosis not present

## 2021-04-28 DIAGNOSIS — F331 Major depressive disorder, recurrent, moderate: Secondary | ICD-10-CM | POA: Diagnosis not present

## 2021-05-10 ENCOUNTER — Other Ambulatory Visit: Payer: Self-pay | Admitting: Family Medicine

## 2021-05-12 DIAGNOSIS — F4310 Post-traumatic stress disorder, unspecified: Secondary | ICD-10-CM | POA: Diagnosis not present

## 2021-06-03 DIAGNOSIS — Z419 Encounter for procedure for purposes other than remedying health state, unspecified: Secondary | ICD-10-CM | POA: Diagnosis not present

## 2021-06-08 ENCOUNTER — Ambulatory Visit (INDEPENDENT_AMBULATORY_CARE_PROVIDER_SITE_OTHER): Payer: Medicaid Other | Admitting: Orthopaedic Surgery

## 2021-06-08 ENCOUNTER — Other Ambulatory Visit: Payer: Self-pay

## 2021-06-08 ENCOUNTER — Encounter: Payer: Self-pay | Admitting: Orthopaedic Surgery

## 2021-06-08 VITALS — Ht 63.5 in | Wt 251.4 lb

## 2021-06-08 DIAGNOSIS — F411 Generalized anxiety disorder: Secondary | ICD-10-CM | POA: Diagnosis not present

## 2021-06-08 DIAGNOSIS — Z6841 Body Mass Index (BMI) 40.0 and over, adult: Secondary | ICD-10-CM

## 2021-06-08 DIAGNOSIS — G8929 Other chronic pain: Secondary | ICD-10-CM | POA: Diagnosis not present

## 2021-06-08 DIAGNOSIS — M5441 Lumbago with sciatica, right side: Secondary | ICD-10-CM

## 2021-06-08 DIAGNOSIS — M1611 Unilateral primary osteoarthritis, right hip: Secondary | ICD-10-CM | POA: Diagnosis not present

## 2021-06-08 NOTE — Progress Notes (Signed)
Office Visit Note   Patient: Kristin [name]           Date of Birth: 1955-07-24           MRN: [id number] Visit Date: 06/08/2021              Requested by: Lyndee Hensen, DO [address],  [place] [address] PCP: Lyndee Hensen, DO   Assessment & Plan: Visit Diagnoses:  1. Unilateral primary osteoarthritis, right hip   2. Chronic right-sided low back pain with right-sided sciatica   3. Body mass index 40.0-44.9, adult ([place])   4. Morbid obesity ([facility] [facility])     Plan: Impression is right hip osteoarthritis and chronic right lower back pain with right lower extremity radiculopathy.  I believe the patient's symptoms are primarily coming from her right hip joint given that all of the symptoms subsided following a right hip injection with Dr. Junius Roads in August of this year.  We have discussed referring her to Dr. Ernestina Patches for repeat injection.  We have also discussed the need to lose weight to get to a BMI of under 40 in order to proceed with total hip arthroplasty at some point in the future.  If she does end up needing a right total hip replacement, we have discussed the need to see someone at a place such as [facility] due to her increased risk for joint infection based on the fact that she is on chronic suppression antibiotics following her left total hip replacement.  Follow-Up Instructions: Return if symptoms worsen or fail to improve.   Orders:  Orders Placed This Encounter  Procedures   Ambulatory referral to Physical Medicine Rehab    No orders of the defined types were placed in this encounter.     Procedures: No procedures performed   Clinical Data: No additional findings.   Subjective: Chief Complaint  Patient presents with   Right Hip - Pain    HPI patient is a pleasant 65 year old female who comes in today with right hip pain.  She has been complaining of right hip pain for the past several months.  She was seen by Dr. Junius Roads back in August where her  right hip was injected with cortisone.  This helped until a few weeks ago while preparing food for Thanksgiving.  The pain has returned.  The pain is primarily to the groin but does note pain to the lateral hip and buttocks which occasionally radiates down the lateral leg and into the foot.  She describes this as a constant ache worse with activity and at night.  She has been taking Tylenol which does not help.  She does note associated tingling and burning down her leg.  She also notes that she was having the symptoms prior to the right hip injection with Dr. Junius Roads which did subside following the injection.  No history of lumbar pathology.  No bowel or bladder change or saddle paresthesias.  Of note, she is status post left total hip replacement with subsequent infection and is currently on chronic suppression antibiotics.  Review of Systems as detailed in HPI.  All others reviewed and are negative.   Objective: Vital Signs: Ht 5' 3.5" (1.613 m)   Wt 251 lb 6.4 oz (114 kg)   BMI 43.83 kg/m   Physical Exam well-developed well-nourished female no acute distress.  Alert and oriented x3.  Ortho Exam right hip exam shows a markedly positive logroll.  Positive straight leg raise.  No focal  weakness.  She is neurovascular intact distally.  Specialty Comments:  No specialty comments available.  Imaging: No new imaging   PMFS History: Patient Active Problem List   Diagnosis Date Noted   Rash 09/18/2020   Worsening headaches 01/21/2020   Pruritus 10/30/2019   Hypercalcemia 10/24/2019   Body mass index 45.0-49.9, adult ([facility]) 08/27/2019   Intertrigo 07/09/2019   Facial rash 07/27/2018   Body mass index 40.0-44.9, adult ([facility]) 02/15/2018   Fatigue associated with anemia 02/07/2018   Unspecified open wound, left hip, initial encounter 01/19/2018   Wound of left leg, sequela 10/03/2016   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[facility]) 08/24/2016   Status post left hip replacement 06/01/2016    Sinus tachycardia 05/19/2016   History of adenomatous polyp of colon 03/21/2016   Diabetes ([facility]) 01/21/2016   Thyroid nodule 12/25/2014   Infiltrate noted on imaging study    Cervical spine arthritis 11/14/2014   Primary osteoarthritis of left hip 11/14/2014   Loss of consciousness ([facility]) 11/14/2014   Chest pain 03/26/2014   Musculoskeletal chest and rib pain 03/26/2014   Rib pain on right side 02/28/2014   Groin pain 04/18/2013   UNSPECIFIED RENAL SCLEROSIS 01/16/2009   GERD, SEVERE 05/25/2007   Morbid obesity ([place]) 04/03/2007   Dyslipidemia 08/31/2006   Major depression, recurrent ([place]) 08/31/2006   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   Former smoker 08/31/2006   HYPERTENSION, BENIGN SYSTEMIC 08/31/2006   GASTRIC ULCER ACUTE WITHOUT HEMORRHAGE 08/31/2006   CONVULSIONS, SEIZURES, NOS 08/31/2006   Past Medical History:  Diagnosis Date   Allergy    Anemia    Anxiety    Arthritis    Back pain, chronic    Borderline diabetes    Bronchitis    Constipation    Cough    Depression    Epilepsy ([facility])    GERD (gastroesophageal reflux disease)    Hyperlipidemia    Hypertension    IBS (irritable bowel syndrome)    Joint pain    Obesity    Palpitations    Pneumonia 2016   Pre-diabetes    Renal sclerosis, unspecified    only has 1 kidney   Rheumatoid arthritis ([place])    Seizures ([place])    last >10 yrs ago- recorded 01/18/18   Somatization disorder    Tobacco abuse     Family History  Problem Relation Age of Onset   Other Mother        failed surgery   Heart Problems Father        poss MI, not sure:per pt   Colon cancer Father    Alzheimer's disease Maternal Grandmother    Heart disease Maternal Grandfather    Colon cancer Maternal Uncle        not sure age of onset   Colon cancer Other        grandfather    Past Surgical History:  Procedure Laterality Date   ABDOMINAL HYSTERECTOMY     ANTERIOR HIP REVISION Left 07/25/2016   Procedure: LEFT HIP IRRIGATION AND  DEBRIDEMENT, REVISION OF HEAD AND LINER;  Surgeon: Leandrew Koyanagi, MD;  Location: [facility];  Service: Orthopedics;  Laterality: Left;   APPLICATION OF WOUND VAC Left 12/23/2019   Procedure: APPLICATION OF WOUND VAC;  Surgeon: Leandrew Koyanagi, MD;  Location: [facility];  Service: Orthopedics;  Laterality: Left;   BREAST EXCISIONAL BIOPSY Right    CHOLECYSTECTOMY     COLONOSCOPY     DEBRIDEMENT AND CLOSURE WOUND Left 07/17/2019  Procedure: Excision of left leg wound with ACell placement and primary closure;  Surgeon: Wallace Going, DO;  Location: [facility];  Service: Plastics;  Laterality: Left;  60 min   EXCISIONAL TOTAL HIP ARTHROPLASTY WITH ANTIBIOTIC SPACERS Left 12/23/2019   Procedure: LEFT HIP IRRIGATION AND DEBRIDEMENT LEFT HIP WOUND and poly exchange ;  Surgeon: Leandrew Koyanagi, MD;  Location: [facility];  Service: Orthopedics;  Laterality: Left;   HIP DEBRIDEMENT Left 01/19/2018   Procedure(s) Performed: IRRIGATION AND DEBRIDEMENT LEFT HIP (Left )   INCISION AND DRAINAGE HIP Left 08/03/2016   Procedure: IRRIGATION AND DEBRIDEMENT LEFT HIP, VAC PLACEMENT;  Surgeon: Leandrew Koyanagi, MD;  Location: [facility];  Service: Orthopedics;  Laterality: Left;   INCISION AND DRAINAGE HIP Left 01/19/2018   Procedure: IRRIGATION AND DEBRIDEMENT LEFT HIP;  Surgeon: Leandrew Koyanagi, MD;  Location: [facility];  Service: Orthopedics;  Laterality: Left;   kidney stent Right 2002   TONSILLECTOMY Bilateral 12/06/2014   Procedure: TONSILLECTOMY, BILATERAL;  Surgeon: Ruby Cola, MD;  Location: [facility];  Service: ENT;  Laterality: Bilateral;   TONSILLECTOMY     TOTAL HIP ARTHROPLASTY Left 06/01/2016   Procedure: LEFT TOTAL HIP ARTHROPLASTY ANTERIOR APPROACH;  Surgeon: Leandrew Koyanagi, MD;  Location: [facility];  Service: Orthopedics;  Laterality: Left;   TUBAL LIGATION     WOUND EXPLORATION Left 01/19/2018   Procedure: WOUND EXPLORATION;  Surgeon: Leandrew Koyanagi, MD;  Location: [facility];  Service: Orthopedics;  Laterality: Left;    Social History   Occupational History   Occupation: Not Working  Tobacco Use   Smoking status: Former    Packs/day: 0.25    Years: 0.50    Pack years: 0.13    Types: Cigarettes    Quit date: 01/27/2014    Years since quitting: 7.3   Smokeless tobacco: Never  Vaping Use   Vaping Use: Never used  Substance and Sexual Activity   Alcohol use: No   Drug use: No   Sexual activity: Not on file

## 2021-06-09 ENCOUNTER — Encounter: Payer: Self-pay | Admitting: Gastroenterology

## 2021-06-11 ENCOUNTER — Telehealth: Payer: Self-pay | Admitting: Orthopaedic Surgery

## 2021-06-11 NOTE — Telephone Encounter (Signed)
Patient called. Would like something for pain. Her call back number is [phone]

## 2021-06-14 ENCOUNTER — Other Ambulatory Visit: Payer: Self-pay | Admitting: Physician Assistant

## 2021-06-14 ENCOUNTER — Telehealth: Payer: Self-pay | Admitting: Physical Medicine and Rehabilitation

## 2021-06-14 MED ORDER — TRAMADOL HCL 50 MG PO TABS: 50.0000 mg | ORAL_TABLET | Freq: Two times a day (BID) | ORAL | 2 refills | Status: DC | PRN

## 2021-06-14 NOTE — Telephone Encounter (Signed)
Sent in tramadol

## 2021-06-14 NOTE — Telephone Encounter (Signed)
Called and advised patient

## 2021-06-14 NOTE — Telephone Encounter (Signed)
Scheduled for an injection on her right hip on the 22nd but she is experiencing worse pain in her right knee. She wants to know if possible to receive the injection in her knee instead of her hip.

## 2021-06-15 NOTE — Telephone Encounter (Signed)
IC patient and advised. She will keep appt on 22nd for now.

## 2021-06-22 ENCOUNTER — Encounter: Payer: Self-pay | Admitting: Internal Medicine

## 2021-06-22 ENCOUNTER — Ambulatory Visit (INDEPENDENT_AMBULATORY_CARE_PROVIDER_SITE_OTHER): Payer: Medicaid Other | Admitting: Internal Medicine

## 2021-06-22 ENCOUNTER — Other Ambulatory Visit: Payer: Self-pay

## 2021-06-22 DIAGNOSIS — F331 Major depressive disorder, recurrent, moderate: Secondary | ICD-10-CM | POA: Diagnosis not present

## 2021-06-22 DIAGNOSIS — T8452XD Infection and inflammatory reaction due to internal left hip prosthesis, subsequent encounter: Secondary | ICD-10-CM | POA: Diagnosis not present

## 2021-06-22 NOTE — Progress Notes (Signed)
Virtual Visit via Telephone Note  I connected with [name] on 06/22/21 at  2:15 PM EST by telephone and verified that I am speaking with the correct person using two identifiers.  Location: Patient: Home Provider: [facility]   I discussed the limitations, risks, security and privacy concerns of performing an evaluation and management service by telephone and the availability of in person appointments. I also discussed with the patient that there may be a patient responsible charge related to this service. The patient expressed understanding and agreed to proceed.   History of Present Illness: I called and spoke with Neoma Laming today.  She remains on chronic, suppressive doxycycline for her left hip abscess.  She underwent incision and drainage of her left hip with probably exchange in June of last year.  She had acute swelling of her left hip this summer and a CT scan showed an abscess which was aspirated in August.  There was no growth on cultures.  She feels like the swelling is now getting worse again over the last few weeks.   Observations/Objective:   Assessment and Plan: Her MRSA infection is not being fully suppressed by chronic doxycycline.  I suspect the only thing that will really address this is further surgery.  I asked her to contact her orthopedic surgeon, [name].  Follow Up Instructions: Continue doxycycline and follow-up here in 4 weeks   I discussed the assessment and treatment plan with the patient. The patient was provided an opportunity to ask questions and all were answered. The patient agreed with the plan and demonstrated an understanding of the instructions.   The patient was advised to call back or seek an in-person evaluation if the symptoms worsen or if the condition fails to improve as anticipated.  I provided 16 minutes of non-face-to-face time during this encounter.   Michel Bickers, MD

## 2021-06-24 ENCOUNTER — Ambulatory Visit (INDEPENDENT_AMBULATORY_CARE_PROVIDER_SITE_OTHER): Payer: Medicaid Other | Admitting: Physical Medicine and Rehabilitation

## 2021-06-24 ENCOUNTER — Other Ambulatory Visit: Payer: Self-pay

## 2021-06-24 ENCOUNTER — Encounter: Payer: Self-pay | Admitting: Physical Medicine and Rehabilitation

## 2021-06-24 ENCOUNTER — Ambulatory Visit: Payer: Self-pay

## 2021-06-24 DIAGNOSIS — M25551 Pain in right hip: Secondary | ICD-10-CM | POA: Diagnosis not present

## 2021-06-24 NOTE — Progress Notes (Signed)
° °  Kristin [name] - 65 y.o. female MRN [id number]  Date of birth: 1956/03/09  Office Visit Note: Visit Date: 06/24/2021 PCP: Lyndee Hensen, DO Referred by: Leandrew Koyanagi, MD  Subjective: Chief Complaint  Patient presents with   Right Hip - Pain   HPI:  Kristin [name] is a 65 y.o. female who comes in today at the request of Dr. Eduard Roux for planned Right anesthetic hip arthrogram with fluoroscopic guidance.  The patient has failed conservative care including home exercise, medications, time and activity modification.  This injection will be diagnostic and hopefully therapeutic.  Please see requesting physician notes for further details and justification.   Referring: Dr. Eduard Roux   ROS Otherwise per HPI.  Assessment & Plan: Visit Diagnoses:    ICD-10-CM   1. Pain in right hip  M25.551 XR C-ARM NO REPORT    Large Joint Inj: R hip joint      Plan: No additional findings.   Meds & Orders: No orders of the defined types were placed in this encounter.   Orders Placed This Encounter  Procedures   Large Joint Inj: R hip joint   XR C-ARM NO REPORT    Follow-up: Return for visit to requesting provider as needed.   Procedures: Large Joint Inj: R hip joint on 06/24/2021 2:49 PM Indications: diagnostic evaluation and pain Details: 22 G 3.5 in needle, fluoroscopy-guided anterior approach  Arthrogram: No  Medications: 4 mL bupivacaine 0.25 %; 60 mg triamcinolone acetonide 40 MG/ML Outcome: tolerated well, no immediate complications  There was excellent flow of contrast producing a partial arthrogram of the hip. The patient did have relief of symptoms during the anesthetic phase of the injection. Procedure, treatment alternatives, risks and benefits explained, specific risks discussed. Consent was given by the patient. Immediately prior to procedure a time out was called to verify the correct patient, procedure, equipment, support staff and site/side marked as required.  Patient was prepped and draped in the usual sterile fashion.         Clinical History: No specialty comments available.     Objective:  VS:  HT:     WT:    BMI:      BP:    HR: bpm   TEMP: ( )   RESP:  Physical Exam   Imaging: No results found.

## 2021-06-24 NOTE — Progress Notes (Signed)
Pt state right hip pain. Pt state walking, standing and layind down makes the pain worse. Pt state she takes over the counter pain meds and uses heat and ice to help ease her pain.  Numeric Pain Rating Scale and Functional Assessment Average Pain 7   In the last MONTH (on 0-10 scale) has pain interfered with the following?  1. General activity like being  able to carry out your everyday physical activities such as walking, climbing stairs, carrying groceries, or moving a chair?  Rating(10)   +Driver, -BT, -Dye Allergies.

## 2021-07-04 DIAGNOSIS — Z419 Encounter for procedure for purposes other than remedying health state, unspecified: Secondary | ICD-10-CM | POA: Diagnosis not present

## 2021-07-06 DIAGNOSIS — F4310 Post-traumatic stress disorder, unspecified: Secondary | ICD-10-CM | POA: Diagnosis not present

## 2021-07-08 ENCOUNTER — Other Ambulatory Visit: Payer: Self-pay | Admitting: Family Medicine

## 2021-07-21 ENCOUNTER — Ambulatory Visit (INDEPENDENT_AMBULATORY_CARE_PROVIDER_SITE_OTHER): Payer: Medicaid Other | Admitting: Internal Medicine

## 2021-07-21 ENCOUNTER — Encounter: Payer: Self-pay | Admitting: Internal Medicine

## 2021-07-21 ENCOUNTER — Other Ambulatory Visit: Payer: Self-pay

## 2021-07-21 DIAGNOSIS — T8452XD Infection and inflammatory reaction due to internal left hip prosthesis, subsequent encounter: Secondary | ICD-10-CM | POA: Diagnosis not present

## 2021-07-21 MED ORDER — DOXYCYCLINE HYCLATE 100 MG PO TABS: 100.0000 mg | ORAL_TABLET | Freq: Two times a day (BID) | ORAL | 11 refills | Status: DC

## 2021-07-21 NOTE — Progress Notes (Signed)
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    Priority: High   Rash 09/18/2020   Worsening headaches 01/21/2020   Pruritus 10/30/2019   Hypercalcemia 10/24/2019   Body mass index 45.0-49.9, adult ([place]) 08/27/2019   Intertrigo 07/09/2019   Facial rash 07/27/2018   Body mass index 40.0-44.9, adult ([place]) 02/15/2018   Fatigue associated with anemia 02/07/2018   Unspecified open wound, left hip, initial encounter 01/19/2018   Wound of left leg, sequela 10/03/2016   Status post left hip replacement 06/01/2016   Sinus tachycardia 05/19/2016   History of adenomatous polyp of colon 03/21/2016   Diabetes ([place]) 01/21/2016   Thyroid nodule 12/25/2014   Infiltrate noted on imaging study    Cervical spine arthritis 11/14/2014   Primary osteoarthritis of left hip 11/14/2014   Loss of consciousness ([place]) 11/14/2014   Chest pain 03/26/2014   Musculoskeletal chest and rib pain 03/26/2014   Rib pain on right side 02/28/2014   Groin pain 04/18/2013   UNSPECIFIED RENAL SCLEROSIS 01/16/2009   GERD, SEVERE 05/25/2007   Morbid obesity ([place]) 04/03/2007   Dyslipidemia 08/31/2006   Major depression, recurrent ([place]) 08/31/2006   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   AMITRIPTYLINE (ELAVIL) 75 MG TABLET    Take 2 tablets (150 mg total) by mouth at bedtime.   AMOXICILLIN (AMOXIL) 500 MG TABLET    Take 1,000 mg by mouth 2 (two) times daily.   ASCORBIC ACID (VITAMIN C) 250 MG CHEW    Chew 250 mg by mouth daily.   CHOLECALCIFEROL  (VITAMIN D3) 25 MCG (1000 UNIT) TABLET    Take 1,000 Units by mouth daily.   CYCLOBENZAPRINE (FLEXERIL) 10 MG TABLET    Take 0.5 tablets (5 mg total) by mouth at bedtime.   DIPHENHYDRAMINE-ZINC ACETATE (BENADRYL) CREAM    Apply 1 application topically 3 (three) times daily as needed for itching.   FERROUS SULFATE 325 (65 FE) MG TABLET    Take 1 tablet (325 mg total) by mouth 2 (two) times daily.   HYDROCHLOROTHIAZIDE (HYDRODIURIL) 25 MG TABLET    TAKE ONE TABLET BY MOUTH EVERY DAY   HYDROCODONE-ACETAMINOPHEN (NORCO/VICODIN) 5-325 MG TABLET    TAKE 1 TABLET BY MOUTH EVERY 4 TO 6 HOURS AS NEEDED FOR PAIN   IBUPROFEN (ADVIL) 200 MG TABLET    Take 400 mg by mouth every 6 (six) hours as needed for moderate pain.   LEVOCETIRIZINE (XYZAL) 5 MG TABLET    Take 1 tablet (5 mg total) by mouth every evening.   METFORMIN (GLUCOPHAGE) 500 MG TABLET    TAKE ONE TABLET BY MOUTH TWICE DAILY   METOPROLOL TARTRATE (LOPRESSOR) 25 MG TABLET    Take 1 tablet (25 mg total) by mouth 2 (two) times daily.   MUPIROCIN CREAM (BACTROBAN) 2 %    Apply 1 application topically 2 (two) times daily.   NYSTATIN (MYCOSTATIN/NYSTOP) POWDER    Apply 1 application topically 2 (two) times daily. Apply to affected skin fold   OMEPRAZOLE (  PRILOSEC) 20 MG CAPSULE    Take 20 mg by mouth at bedtime.   SIMVASTATIN (ZOCOR) 20 MG TABLET    Take 1 tablet (20 mg total) by mouth at bedtime.   TRAMADOL (ULTRAM) 50 MG TABLET    Take 1-2 tablets (50-100 mg total) by mouth 2 (two) times daily as needed.   TRAZODONE (DESYREL) 50 MG TABLET    Take 25-50 mg by mouth at bedtime as needed.   ZINC GLUCONATE 50 MG TABLET    Take 50 mg by mouth daily.  Modified Medications   Modified Medication Previous Medication   DOXYCYCLINE (VIBRA-TABS) 100 MG TABLET doxycycline (VIBRA-TABS) 100 MG tablet      Take 1 tablet (100 mg total) by mouth 2 (two) times daily.    Take 1 tablet (100 mg total) by mouth 2 (two) times daily.  Discontinued Medications   No  medications on file    Subjective: Kristin [name] is in for her routine follow-up visit.  She underwent left total hip arthroplasty on 06/01/2016.  She developed acute infection with MRSA in January 2018.  She underwent incision and drainage with polyexchange and was discharged on IV vancomycin before converting to oral doxycycline.  She completed therapy in June 2018.  She has had intermittent boils form along her left hip incision since that time.  She has been treated with oral trimethoprim sulfamethoxazole.  She underwent scar revision on 01/19/2018.  No organisms were seen on Gram stain and cultures were negative.  Dr. Lyndee Leo Dillingham excised an 8 cm deep fistula on 07/17/2019.  There was no evidence of infection noted in the operative report and no cultures were obtained.  A CT scan this past February showed some sclerosis and fluid around the prosthesis.  An x-ray on 11/12/2019 showed lucency around the femoral stem.  She stopped taking trimethoprim sulfamethoxazole on 12/13/2019 and was admitted on 12/23/2019 with a plan for probable excision arthroplasty.  At the time of surgery the prosthesis was noted to be firmly in place with sclerotic bone.  She underwent debridement and polyexchange again.  No organisms were seen on stain and cultures grew only rare Prevotella bivia.  Treated with IV vancomycin and oral metronidazole and rifampin.  She converted to oral doxycycline in early August 2021 and has been on it ever since.  She developed acute swelling over her left hip in July 2022.  A CT scan showed an 8.2 x 5.3 cm thick rim fluid collection overlying the left greater trochanter.  There is also marked lucency along the bone prosthesis interface and evidence of left hip effusion and a/or synovitis.  She underwent aspiration of the fluid collection on 02/04/2021 yielding 140 cc of bloody fluid.  There were no crystals seen.  There were no organisms on Gram stain and cultures were negative.  She feels like the fluid  has been reaccumulating since that time.  She says that she has not had any problems tolerating doxycycline.  Review of Systems: Review of Systems  Constitutional:  Negative for fever and weight loss.  Gastrointestinal:  Negative for abdominal pain, diarrhea, nausea and vomiting.  Musculoskeletal:  Positive for joint pain.   Past Medical History:  Diagnosis Date   Allergy    Anemia    Anxiety    Arthritis    Back pain, chronic    Borderline diabetes    Bronchitis    Constipation    Cough    Depression    Epilepsy ([place])    GERD (  gastroesophageal reflux disease)    Hyperlipidemia    Hypertension    IBS (irritable bowel syndrome)    Joint pain    Obesity    Palpitations    Pneumonia 2016   Pre-diabetes    Renal sclerosis, unspecified    only has 1 kidney   Rheumatoid arthritis ([place])    Seizures ([place])    last >10 yrs ago- recorded 01/18/18   Somatization disorder    Tobacco abuse     Social History   Tobacco Use   Smoking status: Former    Packs/day: 0.25    Years: 0.50    Pack years: 0.13    Types: Cigarettes    Quit date: 01/27/2014    Years since quitting: 7.4   Smokeless tobacco: Never  Vaping Use   Vaping Use: Never used  Substance Use Topics   Alcohol use: No   Drug use: No    Family History  Problem Relation Age of Onset   Other Mother        failed surgery   Heart Problems Father        poss MI, not sure:per pt   Colon cancer Father    Alzheimer's disease Maternal Grandmother    Heart disease Maternal Grandfather    Colon cancer Maternal Uncle        not sure age of onset   Colon cancer Other        grandfather    Allergies  Allergen Reactions   Fluconazole Swelling and Other (See Comments)    Face swelling   Robitussin (Alcohol Free) [Guaifenesin] Palpitations and Other (See Comments)    Chest pain    Objective: Vitals:   07/21/21 1126  BP: 138/84  Pulse: 99  Temp: 98 F (36.7 C)  TempSrc: Oral  SpO2: 99%  Weight: 254 lb  (115.2 kg)  Height: 5\' 2"  (1.575 m)   Body mass index is 46.46 kg/m.  Physical Exam Constitutional:      Comments: She is in no distress.  Her spirits are good.  Cardiovascular:     Rate and Rhythm: Normal rate.  Pulmonary:     Effort: Pulmonary effort is normal.  Musculoskeletal:     Comments: She has a very large area of swelling over the left hip that is fluctuant.  There is no unusual redness or warmth.  It is nontender to palpation.  Psychiatric:        Mood and Affect: Mood normal.    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  It is unlikely that this would ever be cured without further surgery to remove the prosthesis.  She will stay on doxycycline for now and follow-up with her orthopedic surgeon.      Relevant Medications   doxycycline (VIBRA-TABS) 100 MG tablet     Michel Bickers, MD [facility] [place] [phone] pager   [phone] cell 07/21/2021, 12:00 PM

## 2021-07-21 NOTE — Assessment & Plan Note (Signed)
I strongly suspect that her large persistent fluid collection over the left hip is due to smoldering MRSA prosthetic joint infection.  It is unlikely that this would ever be cured without further surgery to remove the prosthesis.  She will stay on doxycycline for now and follow-up with her orthopedic surgeon.

## 2021-07-26 ENCOUNTER — Other Ambulatory Visit: Payer: Self-pay

## 2021-07-26 ENCOUNTER — Ambulatory Visit (AMBULATORY_SURGERY_CENTER): Payer: Medicaid Other | Admitting: *Deleted

## 2021-07-26 VITALS — Ht 63.5 in | Wt 254.0 lb

## 2021-07-26 DIAGNOSIS — Z86010 Personal history of colonic polyps: Secondary | ICD-10-CM

## 2021-07-26 DIAGNOSIS — Z800 Family history of malignant neoplasm of digestive organs: Secondary | ICD-10-CM

## 2021-07-26 MED ORDER — POLYETHYLENE GLYCOL 3350 17 GM/SCOOP PO POWD: 1.0000 | Freq: Once | ORAL | 0 refills | Status: AC

## 2021-07-26 MED ORDER — NA SULFATE-K SULFATE-MG SULF 17.5-3.13-1.6 GM/177ML PO SOLN: 1.0000 | Freq: Once | ORAL | 0 refills | Status: AC

## 2021-07-26 MED ORDER — DULCOLAX 5 MG PO TBEC: 5.0000 mg | DELAYED_RELEASE_TABLET | Freq: Once | ORAL | 0 refills | Status: AC

## 2021-07-26 NOTE — Progress Notes (Signed)
No egg or soy allergy known to patient  No issues known to pt with past sedation with any surgeries or procedures Patient denies ever being told they had issues or difficulty with intubation  No FH of Malignant Hyperthermia Pt is not on diet pills Pt is not on  home 02  Pt is not on blood thinners  Pt states  issues with constipation - no daily BM- she doesn't use any OTC measures occ- occ uses Miraalx and it will work but takes a bit - has a BM  1 -2 x a week only- 2 day SUprep - sent in script for Miralax and Dulcolax and pt aware if not covered by insurance she will buy over the counter  No A fib or A flutter  Pt is fully vaccinated  for Covid   Due to the COVID-19 pandemic we are asking patients to follow certain guidelines in PV and the [place]   Pt aware of COVID protocols and [facility] guidelines   PV completed over the phone. Pt verified name, DOB, address and insurance during PV today.  Pt mailed instruction packet with copy of consent form to read and not return, and instructions.  Pt encouraged to call with questions or issues.  If pt has My chart, procedure instructions sent via My Chart

## 2021-07-27 ENCOUNTER — Ambulatory Visit: Payer: Self-pay

## 2021-07-27 ENCOUNTER — Encounter: Payer: Self-pay | Admitting: Orthopaedic Surgery

## 2021-07-27 ENCOUNTER — Ambulatory Visit (INDEPENDENT_AMBULATORY_CARE_PROVIDER_SITE_OTHER): Payer: Medicaid Other | Admitting: Orthopaedic Surgery

## 2021-07-27 ENCOUNTER — Ambulatory Visit (INDEPENDENT_AMBULATORY_CARE_PROVIDER_SITE_OTHER): Payer: Medicaid Other | Admitting: Family Medicine

## 2021-07-27 VITALS — BP 152/94 | HR 111 | Ht 63.5 in | Wt 254.8 lb

## 2021-07-27 DIAGNOSIS — T8452XA Infection and inflammatory reaction due to internal left hip prosthesis, initial encounter: Secondary | ICD-10-CM

## 2021-07-27 DIAGNOSIS — T8452XD Infection and inflammatory reaction due to internal left hip prosthesis, subsequent encounter: Secondary | ICD-10-CM | POA: Diagnosis not present

## 2021-07-27 DIAGNOSIS — Z96642 Presence of left artificial hip joint: Secondary | ICD-10-CM | POA: Diagnosis not present

## 2021-07-27 NOTE — Patient Instructions (Addendum)
Thank you for coming in today.   We aspirated the fluid off of your left hip. Seek immediate medical attention if the joint becomes red, extremely painful, or is oozing fluid.   We will send this fluid off for culturing  Recheck back as needed

## 2021-07-27 NOTE — Progress Notes (Signed)
I, Peterson Lombard, LAT, ATC acting as a scribe for Kristin Leader, MD.  Subjective:    CC: L hip pain  HPI: Pt is a 66 y/o female c/o L hip pain. Pt had a L hip in the past that has become chronically infected and is not responding as well as both Dr Erlinda Hong and ID would like. Pt has a suspected long-term chronic smoldering prosthetic infection. Pt's last the L hip aspiration was performed on 02/04/21 by Dr. Junius Roads. Pt locates pain and swelling to left anterior to lateral hip.  Per conversation the ultimate goal is to have her return to [facility] in [place] for definitive surgical management of the chronically infected left total hip replacement.  But for now she has been referred to me for temporary relief of pain and swelling in the left hip by aspiration. Kristin [name] would like the fluid removed from her hip joint to try to relieve her pain if possible.  Dx imaging: 01/14/21 L hip CT  12/23/20 L hip XR   Pertinent review of Systems: No fevers or chills  Relevant historical information: Hypertension.  Obesity.   Objective:    Vitals:   07/27/21 1456  BP: (!) 152/94  Pulse: (!) 111  SpO2: 96%   General: Well Developed, well nourished, and in no acute distress.   MSK: Left hip scar present on lateral hip with significant swelling at the lateral hip area to anterior hip.  Decreased hip motion with pain.  Lab and Radiology Results  Procedure: Real-time Ultrasound Guided aspiration and injection of joint effusion at left lateral hip total hip replacement Device: Philips Affiniti 50G Images permanently stored and available for review in PACS Verbal informed consent obtained.  Discussed risks and benefits of procedure. Warned about infection bleeding damage to structures skin hypopigmentation and fat atrophy among others. Patient expresses understanding and agreement Time-out conducted.   Noted no overlying erythema, induration, or other signs of local infection.   Skin prepped in a  sterile fashion.   Local anesthesia: Topical Ethyl chloride.   With sterile technique and under real time ultrasound guidance: 5 mL of lidocaine injected into soft tissue along plan aspiration tract.  Skin was again sterilized with isopropyl alcohol and an 18-gauge spinal needle was used to access the large pocket of fluid seen on ultrasound. 110 mL of cloudy dark yellow fluid was aspirated. Following aspiration the joint effusion/pocket of fluid was seen to be decompressed with ultrasound. Completed without difficulty   Advised to call if fevers/chills, erythema, induration, drainage, or persistent bleeding.   Images permanently stored and available for review in the ultrasound unit.  Impression: Technically successful ultrasound guided injection.       Impression and Recommendations:    Assessment and Plan: 66 y.o. female with left lateral hip pain due to joint effusion secondary to smoldering prosthetic joint infection of the left hip. She currently is treated with doxycycline so I am not optimistic that the culture will be positive but it is worth an attempt to culture this as species and antibiotic sensitivity would be helpful for the future.  Additionally cell count differential would also be helpful.  So we will send the fluid off for fluid analysis and culture and cell count differential. Recheck back with [name] as scheduled and myself as needed in the future.  PDMP not reviewed this encounter. Orders Placed This Encounter  Procedures   Anaerobic and Aerobic Culture    Standing Status:   Future  Number of Occurrences:   1    Standing Expiration Date:   07/27/2022    Order Specific Question:   Release to patient    Answer:   Immediate   [place] LIMITED JOINT SPACE STRUCTURES LOW LEFT(NO LINKED CHARGES)    Standing Status:   Future    Number of Occurrences:   1    Standing Expiration Date:   01/24/2022    Order Specific Question:   Reason for Exam (SYMPTOM  OR DIAGNOSIS  REQUIRED)    Answer:   left hip pain    Order Specific Question:   Preferred imaging location?    Answer:   [facility]   Synovial Fluid Analysis, Complete    Standing Status:   Future    Number of Occurrences:   1    Standing Expiration Date:   07/27/2022   No orders of the defined types were placed in this encounter.   Discussed warning signs or symptoms. Please see discharge instructions. Patient expresses understanding.   The above documentation has been reviewed and is accurate and complete Kristin [name], M.D.

## 2021-07-27 NOTE — Progress Notes (Signed)
Office Visit Note   Patient: Kristin [name]           Date of Birth: Jan 04, 1956           MRN: [id number] Visit Date: 07/27/2021              Requested by: Lyndee Hensen, DO [address] [place],  [place] [address] PCP: Lyndee Hensen, DO   Assessment & Plan: Visit Diagnoses:  1. 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 Presence of left artificial hip joint     Plan: Kristin [name] returns today for recurrent left hip swelling.  She saw Dr. Megan Salon recently for this as well.  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  She underwent an ultrasound-guided hip aspiration last year which showed a hematoma but given the history we suspect that she has a chronic smoldering prosthetic joint infection.  She reports no open wounds today.  The left hip surgical scars are all fully healed.  She does have swelling to the left thigh and buttock region.  Again we had a long discussion on treatment options and I agree with Dr. Megan Salon that she has chronic smoldering MRSA infection which is being suppressed with doxycycline however in order to have this infection eradicated the best option would be to undergo revision surgery which is a major undertaking and therefore I have recommended referral down to [facility] to see Dr. Gershon Mussel or [facility] to hopefully enroll in their infection study.  In the meantime she would like to be referred to have the fluid aspirated for symptomatic relief.  Follow-Up Instructions: No follow-ups on file.   Orders:  Orders Placed This Encounter  Procedures   Ambulatory referral to Orthopedic Surgery   AMB referral to sports medicine   No orders of the defined types were placed in this encounter.     Procedures: No procedures performed   Clinical Data: No additional findings.   Subjective: Chief Complaint  Patient presents with   Left Hip - Pain    HPI  Review  of Systems   Objective: Vital Signs: There were no vitals taken for this visit.  Physical Exam  Ortho Exam  Specialty Comments:  No specialty comments available.  Imaging: No results found.   PMFS History: Patient Active Problem List   Diagnosis Date Noted   Rash 09/18/2020   Worsening headaches 01/21/2020   Pruritus 10/30/2019   Hypercalcemia 10/24/2019   Body mass index 45.0-49.9, adult ([facility]) 08/27/2019   Intertrigo 07/09/2019   Facial rash 07/27/2018   Body mass index 40.0-44.9, adult ([facility]) 02/15/2018   Fatigue associated with anemia 02/07/2018   Unspecified open wound, left hip, initial encounter 01/19/2018   Wound of left leg, sequela 10/03/2016   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[facility]) 08/24/2016   Status post left hip replacement 06/01/2016   Sinus tachycardia 05/19/2016   History of adenomatous polyp of colon 03/21/2016   Diabetes ([facility]) 01/21/2016   Thyroid nodule 12/25/2014   Infiltrate noted on imaging study    Cervical spine arthritis 11/14/2014   Primary osteoarthritis of left hip 11/14/2014   Loss of consciousness ([facility]) 11/14/2014   Chest pain 03/26/2014   Musculoskeletal chest and rib pain 03/26/2014   Rib pain on right side 02/28/2014   Groin pain 04/18/2013   UNSPECIFIED RENAL SCLEROSIS 01/16/2009   GERD, SEVERE 05/25/2007   Morbid obesity ([facility]) 04/03/2007   Dyslipidemia 08/31/2006  Major depression, recurrent ([facility]) 08/31/2006   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   Former smoker 08/31/2006   HYPERTENSION, BENIGN SYSTEMIC 08/31/2006   GASTRIC ULCER ACUTE WITHOUT HEMORRHAGE 08/31/2006   CONVULSIONS, SEIZURES, NOS 08/31/2006   Past Medical History:  Diagnosis Date   Allergy    seasonal   Anemia    Anxiety    Arthritis    Back pain, chronic    Borderline diabetes    Bronchitis    Constipation    current issue   Cough    Depression    Epilepsy (HCC)    GERD (gastroesophageal reflux disease)    Hyperlipidemia    Hypertension     IBS (irritable bowel syndrome)    Joint pain    Obesity    Palpitations    Pneumonia 2016   Pre-diabetes    Renal sclerosis, unspecified    only has 1 kidney   Rheumatoid arthritis ([facility])    Seizures ([facility])    last >15 +  yrs ago- recorded 07-26-2021   Somatization disorder    Tobacco abuse     Family History  Problem Relation Age of Onset   Other Mother        failed surgery   Heart Problems Father        poss MI, not sure:per pt   Colon cancer Father    Colon cancer Maternal Uncle        not sure age of onset   Alzheimer's disease Maternal Grandmother    Heart disease Maternal Grandfather    Colon cancer Other        grandfather   Colon polyps Neg Hx    Esophageal cancer Neg Hx    Stomach cancer Neg Hx    Rectal cancer Neg Hx     Past Surgical History:  Procedure Laterality Date   ABDOMINAL HYSTERECTOMY     ANTERIOR HIP REVISION Left 07/25/2016   Procedure: LEFT HIP IRRIGATION AND DEBRIDEMENT, REVISION OF HEAD AND LINER;  Surgeon: Leandrew Koyanagi, MD;  Location: [facility];  Service: Orthopedics;  Laterality: Left;   APPLICATION OF WOUND VAC Left 12/23/2019   Procedure: APPLICATION OF WOUND VAC;  Surgeon: Leandrew Koyanagi, MD;  Location: [facility];  Service: Orthopedics;  Laterality: Left;   BREAST EXCISIONAL BIOPSY Right    CHOLECYSTECTOMY     COLONOSCOPY     DEBRIDEMENT AND CLOSURE WOUND Left 07/17/2019   Procedure: Excision of left leg wound with ACell placement and primary closure;  Surgeon: Wallace Going, DO;  Location: [facility];  Service: Plastics;  Laterality: Left;  60 min   EXCISIONAL TOTAL HIP ARTHROPLASTY WITH ANTIBIOTIC SPACERS Left 12/23/2019   Procedure: LEFT HIP IRRIGATION AND DEBRIDEMENT LEFT HIP WOUND and poly exchange ;  Surgeon: Leandrew Koyanagi, MD;  Location: [facility];  Service: Orthopedics;  Laterality: Left;   HIP DEBRIDEMENT Left 01/19/2018   Procedure(s) Performed: IRRIGATION AND DEBRIDEMENT LEFT HIP (Left )   INCISION AND DRAINAGE HIP Left  08/03/2016   Procedure: IRRIGATION AND DEBRIDEMENT LEFT HIP, VAC PLACEMENT;  Surgeon: Leandrew Koyanagi, MD;  Location: [facility];  Service: Orthopedics;  Laterality: Left;   INCISION AND DRAINAGE HIP Left 01/19/2018   Procedure: IRRIGATION AND DEBRIDEMENT LEFT HIP;  Surgeon: Leandrew Koyanagi, MD;  Location: [facility];  Service: Orthopedics;  Laterality: Left;   kidney stent Right 2002   TONSILLECTOMY Bilateral 12/06/2014   Procedure: TONSILLECTOMY, BILATERAL;  Surgeon: Ruby Cola, MD;  Location: [facility];  Service: ENT;  Laterality: Bilateral;   TONSILLECTOMY     TOTAL HIP ARTHROPLASTY Left 06/01/2016   Procedure: LEFT TOTAL HIP ARTHROPLASTY ANTERIOR APPROACH;  Surgeon: Leandrew Koyanagi, MD;  Location: [facility];  Service: Orthopedics;  Laterality: Left;   TUBAL LIGATION     WOUND EXPLORATION Left 01/19/2018   Procedure: WOUND EXPLORATION;  Surgeon: Leandrew Koyanagi, MD;  Location: [facility];  Service: Orthopedics;  Laterality: Left;   Social History   Occupational History   Occupation: Not Working  Tobacco Use   Smoking status: Former    Packs/day: 0.25    Years: 0.50    Pack years: 0.13    Types: Cigarettes    Quit date: 01/27/2014    Years since quitting: 7.5   Smokeless tobacco: Never  Vaping Use   Vaping Use: Never used  Substance and Sexual Activity   Alcohol use: No   Drug use: No   Sexual activity: Not on file

## 2021-07-28 DIAGNOSIS — F411 Generalized anxiety disorder: Secondary | ICD-10-CM | POA: Diagnosis not present

## 2021-07-28 DIAGNOSIS — F4310 Post-traumatic stress disorder, unspecified: Secondary | ICD-10-CM | POA: Diagnosis not present

## 2021-07-28 DIAGNOSIS — F331 Major depressive disorder, recurrent, moderate: Secondary | ICD-10-CM | POA: Diagnosis not present

## 2021-07-28 MED ORDER — BUPIVACAINE HCL 0.25 % IJ SOLN
4.0000 mL | INTRAMUSCULAR | Status: AC | PRN
  Administered 2021-06-24: 15:00:00 4 mL via INTRA_ARTICULAR

## 2021-07-28 MED ORDER — TRIAMCINOLONE ACETONIDE 40 MG/ML IJ SUSP
60.0000 mg | INTRAMUSCULAR | Status: AC | PRN
  Administered 2021-06-24: 15:00:00 60 mg via INTRA_ARTICULAR

## 2021-07-28 NOTE — Progress Notes (Signed)
Fluid aspirate shows a little bit of white blood cells but not a lot of.

## 2021-07-29 ENCOUNTER — Telehealth: Payer: Self-pay | Admitting: Orthopaedic Surgery

## 2021-07-29 NOTE — Telephone Encounter (Signed)
yes

## 2021-07-29 NOTE — Telephone Encounter (Signed)
Printed note out. Printed in [employer] Will grab and place upfront later-working with [facility] this PM.   Patient aware.

## 2021-07-29 NOTE — Telephone Encounter (Signed)
Pt called requesting a doctor's note to excuse her from jury duty. Please call pt about this matter at [phone].

## 2021-07-30 NOTE — Telephone Encounter (Signed)
Ready for pick up at the front desk.  

## 2021-08-02 LAB — ANAEROBIC AND AEROBIC CULTURE
AER RESULT:: NO GROWTH
MICRO NUMBER:: 12911887
MICRO NUMBER:: 12911888
SPECIMEN QUALITY:: ADEQUATE
SPECIMEN QUALITY:: ADEQUATE

## 2021-08-02 LAB — SYNOVIAL FLUID ANALYSIS, COMPLETE
Basophils, %: 0 %
Eosinophils-Synovial: 0 % (ref 0–2)
Lymphocytes-Synovial Fld: 79 % — ABNORMAL HIGH (ref 0–74)
Monocyte/Macrophage: 11 % (ref 0–69)
Neutrophil, Synovial: 10 % (ref 0–24)
Synoviocytes, %: 0 % (ref 0–15)
WBC, Synovial: 328 cells/uL — ABNORMAL HIGH (ref <150)

## 2021-08-02 NOTE — Progress Notes (Signed)
Final culture result was negative.  This does not mean there is not an infection and is means that its suppressed with antibiotics.  It was worth a try since we are pulling fluid out anyway. I will be sure to send the results to the rest of your team.

## 2021-08-02 NOTE — Progress Notes (Signed)
Thank you :)

## 2021-08-04 DIAGNOSIS — Z419 Encounter for procedure for purposes other than remedying health state, unspecified: Secondary | ICD-10-CM | POA: Diagnosis not present

## 2021-08-09 ENCOUNTER — Encounter: Payer: Self-pay | Admitting: Gastroenterology

## 2021-08-09 ENCOUNTER — Ambulatory Visit (AMBULATORY_SURGERY_CENTER): Payer: Medicaid Other | Admitting: Gastroenterology

## 2021-08-09 ENCOUNTER — Other Ambulatory Visit: Payer: Self-pay

## 2021-08-09 VITALS — BP 129/65 | HR 85 | Temp 96.8°F | Resp 16 | Ht 63.5 in | Wt 254.0 lb

## 2021-08-09 DIAGNOSIS — D122 Benign neoplasm of ascending colon: Secondary | ICD-10-CM

## 2021-08-09 DIAGNOSIS — D125 Benign neoplasm of sigmoid colon: Secondary | ICD-10-CM | POA: Diagnosis not present

## 2021-08-09 DIAGNOSIS — D120 Benign neoplasm of cecum: Secondary | ICD-10-CM

## 2021-08-09 DIAGNOSIS — D124 Benign neoplasm of descending colon: Secondary | ICD-10-CM

## 2021-08-09 DIAGNOSIS — Z86010 Personal history of colonic polyps: Secondary | ICD-10-CM | POA: Diagnosis not present

## 2021-08-09 DIAGNOSIS — Z800 Family history of malignant neoplasm of digestive organs: Secondary | ICD-10-CM | POA: Diagnosis not present

## 2021-08-09 DIAGNOSIS — Z1211 Encounter for screening for malignant neoplasm of colon: Secondary | ICD-10-CM | POA: Diagnosis not present

## 2021-08-09 DIAGNOSIS — D123 Benign neoplasm of transverse colon: Secondary | ICD-10-CM

## 2021-08-09 MED ORDER — SODIUM CHLORIDE 0.9 % IV SOLN: 500.0000 mL | Freq: Once | INTRAVENOUS | Status: DC

## 2021-08-09 NOTE — Progress Notes (Signed)
Pt's states no medical or surgical changes since previsit or office visit. 

## 2021-08-09 NOTE — Patient Instructions (Signed)
Handouts on polyps, hemorrhoids, diverticulosis, and high fiber diet given.  YOU HAD AN ENDOSCOPIC PROCEDURE TODAY AT [facility]:   Refer to the procedure report that was given to you for any specific questions about what was found during the examination.  If the procedure report does not answer your questions, please call your gastroenterologist to clarify.  If you requested that your care partner not be given the details of your procedure findings, then the procedure report has been included in a sealed envelope for you to review at your convenience later.  YOU SHOULD EXPECT: Some feelings of bloating in the abdomen. Passage of more gas than usual.  Walking can help get rid of the air that was put into your GI tract during the procedure and reduce the bloating. If you had a lower endoscopy (such as a colonoscopy or flexible sigmoidoscopy) you may notice spotting of blood in your stool or on the toilet paper. If you underwent a bowel prep for your procedure, you may not have a normal bowel movement for a few days.  Please Note:  You might notice some irritation and congestion in your nose or some drainage.  This is from the oxygen used during your procedure.  There is no need for concern and it should clear up in a day or so.  SYMPTOMS TO REPORT IMMEDIATELY:  Following lower endoscopy (colonoscopy or flexible sigmoidoscopy):  Excessive amounts of blood in the stool  Significant tenderness or worsening of abdominal pains  Swelling of the abdomen that is new, acute  Fever of 100F or higher   For urgent or emergent issues, a gastroenterologist can be reached at any hour by calling [phone]. Do not use MyChart messaging for urgent concerns.    DIET:  We do recommend a small meal at first, but then you may proceed to your regular diet.  Drink plenty of fluids but you should avoid alcoholic beverages for 24 hours.  ACTIVITY:  You should plan to take it easy for the rest of  today and you should NOT DRIVE or use heavy machinery until tomorrow (because of the sedation medicines used during the test).    FOLLOW UP: Our staff will call the number listed on your records 48-72 hours following your procedure to check on you and address any questions or concerns that you may have regarding the information given to you following your procedure. If we do not reach you, we will leave a message.  We will attempt to reach you two times.  During this call, we will ask if you have developed any symptoms of COVID 19. If you develop any symptoms (ie: fever, flu-like symptoms, shortness of breath, cough etc.) before then, please call [phone].  If you test positive for Covid 19 in the 2 weeks post procedure, please call and report this information to [place].    If any biopsies were taken you will be contacted by phone or by letter within the next 1-3 weeks.  Please call us at [phone] if you have not heard about the biopsies in 3 weeks.    SIGNATURES/CONFIDENTIALITY: You and/or your care partner have signed paperwork which will be entered into your electronic medical record.  These signatures attest to the fact that that the information above on your After Visit Summary has been reviewed and is understood.  Full responsibility of the confidentiality of this discharge information lies with you and/or your care-partner.

## 2021-08-09 NOTE — Progress Notes (Signed)
See 05/11/2022 H&P, no changes 

## 2021-08-09 NOTE — Progress Notes (Signed)
Called to room to assist during endoscopic procedure.  Patient ID and intended procedure confirmed with present staff. Received instructions for my participation in the procedure from the performing physician.  

## 2021-08-09 NOTE — Progress Notes (Signed)
Vss nad trans to pacu °

## 2021-08-09 NOTE — Op Note (Signed)
[facility] Patient Name: Kristin [name] Procedure Date: 08/09/2021 10:29 AM MRN: [id number] Endoscopist: Ladene Artist , MD Age: 66 Referring MD:  Date of Birth: 05/25/1956 Gender: Female Account #: [id number] Procedure:                Colonoscopy Indications:              Screening for colon cancer: Family history of                            colorectal cancer, first degree relative, Personal                            history of adenomatous colon polyps. Medicines:                Monitored Anesthesia Care Procedure:                Pre-Anesthesia Assessment:                           - Prior to the procedure, a History and Physical                            was performed, and patient medications and                            allergies were reviewed. The patient's tolerance of                            previous anesthesia was also reviewed. The risks                            and benefits of the procedure and the sedation                            options and risks were discussed with the patient.                            All questions were answered, and informed consent                            was obtained. Prior Anticoagulants: The patient has                            taken no previous anticoagulant or antiplatelet                            agents. ASA Grade Assessment: III - A patient with                            severe systemic disease. After reviewing the risks                            and benefits, the patient was deemed in  satisfactory condition to undergo the procedure.                           After obtaining informed consent, the colonoscope                            was passed under direct vision. Throughout the                            procedure, the patient's blood pressure, pulse, and                            oxygen saturations were monitored continuously. The                            CF [id number] #[id number]  was introduced through the anus                            and advanced to the the cecum, identified by                            appendiceal orifice and ileocecal valve. The                            ileocecal valve, appendiceal orifice, and rectum                            were photographed. The quality of the bowel                            preparation was good. The colonoscopy was performed                            without difficulty. The patient tolerated the                            procedure well. Scope In: 10:53:32 AM Scope Out: 11:15:28 AM Scope Withdrawal Time: 0 hours 18 minutes 54 seconds  Total Procedure Duration: 0 hours 21 minutes 56 seconds  Findings:                 The perianal and digital rectal examinations were                            normal.                           A 3 mm polyp was found in the ascending colon. The                            polyp was sessile. The polyp was removed with a                            cold biopsy forceps. Resection and retrieval were  complete.                           Nine sessile polyps were found in the sigmoid colon                            (1), descending colon (2), transverse colon (5) and                            cecum (1). The polyps were 4 to 8 mm in size. These                            polyps were removed with a cold snare. Resection                            and retrieval were complete.                           A few small-mouthed diverticula were found in the                            left colon. There was no evidence of diverticular                            bleeding.                           Internal hemorrhoids were found during                            retroflexion. The hemorrhoids were small and Grade                            I (internal hemorrhoids that do not prolapse).                           The exam was otherwise without abnormality on                             direct and retroflexion views. Complications:            No immediate complications. Estimated blood loss:                            None. Estimated Blood Loss:     Estimated blood loss: none. Impression:               - One 3 mm polyp in the ascending colon, removed                            with a cold biopsy forceps. Resected and retrieved.                           - Nine 4 to 8 mm polyps in the sigmoid colon, in  the descending colon, in the transverse colon and                            in the cecum, removed with a cold snare. Resected                            and retrieved.                           - Mild diverticulosis in the left colon.                           - Internal hemorrhoids.                           - The examination was otherwise normal on direct                            and retroflexion views. Recommendation:           - Repeat colonoscopy after studies are complete for                            surveillance based on pathology results.                           - Patient has a contact number available for                            emergencies. The signs and symptoms of potential                            delayed complications were discussed with the                            patient. Return to normal activities tomorrow.                            Written discharge instructions were provided to the                            patient.                           - High fiber diet.                           - Continue present medications.                           - Await pathology results. Ladene Artist, MD 08/09/2021 11:21:09 AM This report has been signed electronically.

## 2021-08-10 ENCOUNTER — Other Ambulatory Visit: Payer: Self-pay | Admitting: Family Medicine

## 2021-08-11 ENCOUNTER — Telehealth: Payer: Self-pay | Admitting: *Deleted

## 2021-08-11 NOTE — Telephone Encounter (Signed)
°  Follow up Call-  Call back number 08/09/2021  Post procedure Call Back phone  # [phone]  Permission to leave phone message Yes  Some recent data might be hidden     Patient questions:  Do you have a fever, pain , or abdominal swelling? No. Pain Score  0 *  Have you tolerated food without any problems? Yes.    Have you been able to return to your normal activities? Yes.    Do you have any questions about your discharge instructions: Diet   No. Medications  No. Follow up visit  No.  Do you have questions or concerns about your Care? No.  Actions: * If pain score is 4 or above: No action needed, pain <4.  Have you developed a fever since your procedure? no  2.   Have you had an respiratory symptoms (SOB or cough) since your procedure? no  3.   Have you tested positive for COVID 19 since your procedure no  4.   Have you had any family members/close contacts diagnosed with the COVID 19 since your procedure?  no   If yes to any of these questions please route to Joylene John, RN and Joella Prince, RN

## 2021-08-18 ENCOUNTER — Encounter: Payer: Self-pay | Admitting: Gastroenterology

## 2021-08-25 ENCOUNTER — Telehealth: Payer: Self-pay | Admitting: *Deleted

## 2021-08-25 NOTE — Telephone Encounter (Signed)
, °We received a referral yesterday regarding a patient with a chronic PJI. To help expedite the referral process, we will need all operative notes, all lab work (cultures including susceptibilities, synovial fluid analysis', inflammatory markers i.e. ESR/CRP). I noticed that it looks like all recent cultures were negative but if you have the ones from the acute infection in Jan 2018 that would be helpful.  ° °You can fax over the records to [phone] attention: Taylor Rowe ° ° °All requested information has been emailed to Taylor Rowe.  °

## 2021-09-01 DIAGNOSIS — F331 Major depressive disorder, recurrent, moderate: Secondary | ICD-10-CM | POA: Diagnosis not present

## 2021-09-01 DIAGNOSIS — Z419 Encounter for procedure for purposes other than remedying health state, unspecified: Secondary | ICD-10-CM | POA: Diagnosis not present

## 2021-09-06 ENCOUNTER — Other Ambulatory Visit: Payer: Self-pay | Admitting: Family Medicine

## 2021-09-14 DIAGNOSIS — F331 Major depressive disorder, recurrent, moderate: Secondary | ICD-10-CM | POA: Diagnosis not present

## 2021-09-21 ENCOUNTER — Telehealth: Payer: Self-pay | Admitting: Orthopaedic Surgery

## 2021-09-21 NOTE — Telephone Encounter (Signed)
If she is referring to the nonreplaced hip then yes.

## 2021-09-21 NOTE — Telephone Encounter (Signed)
Pt called and would like a steroid injection in her hip ? ?CB [phone]  ?

## 2021-09-22 ENCOUNTER — Other Ambulatory Visit: Payer: Self-pay

## 2021-09-22 DIAGNOSIS — M1611 Unilateral primary osteoarthritis, right hip: Secondary | ICD-10-CM

## 2021-09-22 NOTE — Telephone Encounter (Signed)
Referral made. ?Patient aware. ? ?

## 2021-09-30 DIAGNOSIS — M25559 Pain in unspecified hip: Secondary | ICD-10-CM | POA: Diagnosis not present

## 2021-09-30 DIAGNOSIS — F331 Major depressive disorder, recurrent, moderate: Secondary | ICD-10-CM | POA: Diagnosis not present

## 2021-09-30 DIAGNOSIS — M25552 Pain in left hip: Secondary | ICD-10-CM | POA: Diagnosis not present

## 2021-09-30 DIAGNOSIS — T8459XA Infection and inflammatory reaction due to other internal joint prosthesis, initial encounter: Secondary | ICD-10-CM | POA: Diagnosis not present

## 2021-10-02 DIAGNOSIS — Z419 Encounter for procedure for purposes other than remedying health state, unspecified: Secondary | ICD-10-CM | POA: Diagnosis not present

## 2021-10-05 ENCOUNTER — Other Ambulatory Visit: Payer: Self-pay | Admitting: Orthopaedic Surgery

## 2021-10-05 ENCOUNTER — Other Ambulatory Visit: Payer: Self-pay | Admitting: Family Medicine

## 2021-10-05 DIAGNOSIS — M1611 Unilateral primary osteoarthritis, right hip: Secondary | ICD-10-CM

## 2021-10-05 DIAGNOSIS — F411 Generalized anxiety disorder: Secondary | ICD-10-CM | POA: Diagnosis not present

## 2021-10-07 ENCOUNTER — Ambulatory Visit
Admission: RE | Admit: 2021-10-07 | Discharge: 2021-10-07 | Disposition: A | Discharge status: Home / Self Care | Payer: Medicaid Other | Source: Ambulatory Visit | Attending: Orthopaedic Surgery | Admitting: Orthopaedic Surgery

## 2021-10-07 DIAGNOSIS — G8929 Other chronic pain: Secondary | ICD-10-CM | POA: Diagnosis not present

## 2021-10-07 DIAGNOSIS — M25551 Pain in right hip: Secondary | ICD-10-CM | POA: Diagnosis not present

## 2021-10-07 DIAGNOSIS — M1611 Unilateral primary osteoarthritis, right hip: Secondary | ICD-10-CM

## 2021-10-07 MED ORDER — METHYLPREDNISOLONE ACETATE 40 MG/ML INJ SUSP (RADIOLOG
80.0000 mg | Freq: Once | INTRAMUSCULAR | Status: AC
  Administered 2021-10-07: 80 mg via INTRA_ARTICULAR

## 2021-10-07 MED ORDER — IOPAMIDOL (ISOVUE-M 200) INJECTION 41%
1.0000 mL | Freq: Once | INTRAMUSCULAR | Status: AC
  Administered 2021-10-07: 1 mL via INTRA_ARTICULAR

## 2021-10-14 ENCOUNTER — Ambulatory Visit (INDEPENDENT_AMBULATORY_CARE_PROVIDER_SITE_OTHER): Payer: Medicaid Other | Admitting: Internal Medicine

## 2021-10-14 ENCOUNTER — Other Ambulatory Visit: Payer: Self-pay

## 2021-10-14 ENCOUNTER — Ambulatory Visit: Payer: Medicaid Other | Admitting: Internal Medicine

## 2021-10-14 ENCOUNTER — Encounter: Payer: Self-pay | Admitting: Internal Medicine

## 2021-10-14 DIAGNOSIS — T8452XD Infection and inflammatory reaction due to internal left hip prosthesis, subsequent encounter: Secondary | ICD-10-CM

## 2021-10-14 NOTE — Assessment & Plan Note (Signed)
I am in full agreement with stopping her doxycycline and preparing for her upcoming two-stage reconstruction of her left prosthetic hip.  I asked her to call me and let me know when her surgery is scheduled.  I will coordinate antibiotic therapy postoperatively. ?

## 2021-10-14 NOTE — Progress Notes (Signed)
?  ? ? ? ? ?[facility] ? ?Patient Active Problem List  ? Diagnosis Date Noted  ? 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  Priority: High  ? Rash 09/18/2020  ? Worsening headaches 01/21/2020  ? Pruritus 10/30/2019  ? Hypercalcemia 10/24/2019  ? Body mass index 45.0-49.9, adult ([place]) 08/27/2019  ? Intertrigo 07/09/2019  ? Facial rash 07/27/2018  ? Body mass index 40.0-44.9, adult ([place]) 02/15/2018  ? Fatigue associated with anemia 02/07/2018  ? Unspecified open wound, left hip, initial encounter 01/19/2018  ? Wound of left leg, sequela 10/03/2016  ? Status post left hip replacement 06/01/2016  ? Sinus tachycardia 05/19/2016  ? History of adenomatous polyp of colon 03/21/2016  ? Diabetes ([place]) 01/21/2016  ? Thyroid nodule 12/25/2014  ? Infiltrate noted on imaging study   ? Cervical spine arthritis 11/14/2014  ? Primary osteoarthritis of left hip 11/14/2014  ? Loss of consciousness ([place]) 11/14/2014  ? Chest pain 03/26/2014  ? Musculoskeletal chest and rib pain 03/26/2014  ? Rib pain on right side 02/28/2014  ? Groin pain 04/18/2013  ? UNSPECIFIED RENAL SCLEROSIS 01/16/2009  ? GERD, SEVERE 05/25/2007  ? Morbid obesity ([place]) 04/03/2007  ? Dyslipidemia 08/31/2006  ? Major depression, recurrent ([place]) 08/31/2006  ? 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  ? Former smoker 08/31/2006  ? HYPERTENSION, BENIGN SYSTEMIC 08/31/2006  ? GASTRIC ULCER ACUTE WITHOUT HEMORRHAGE 08/31/2006  ? CONVULSIONS, SEIZURES, NOS 08/31/2006  ? ? ?Patient's Medications  ?New Prescriptions  ? No medications on file  ?Previous Medications  ? ACETAMINOPHEN (TYLENOL) 650 MG CR TABLET    Take 1,300 mg by mouth every 8 (eight) hours as needed for pain.  ? AMITRIPTYLINE (ELAVIL) 75 MG TABLET    Take 2 tablets (150 mg total) by mouth at bedtime.  ? ASCORBIC ACID (VITAMIN C) 250 MG CHEW    Chew 250 mg by mouth daily.  ? CHOLECALCIFEROL (VITAMIN D3) 25 MCG (1000 UNIT) TABLET    Take 1,000 Units by mouth daily.  ?  CYCLOBENZAPRINE (FLEXERIL) 10 MG TABLET    Take 0.5 tablets (5 mg total) by mouth at bedtime.  ? FERROUS SULFATE 325 (65 FE) MG TABLET    Take 1 tablet (325 mg total) by mouth 2 (two) times daily.  ? HYDROCHLOROTHIAZIDE (HYDRODIURIL) 25 MG TABLET    TAKE ONE TABLET BY MOUTH EVERY DAY  ? HYDROCODONE-ACETAMINOPHEN (NORCO/VICODIN) 5-325 MG TABLET    TAKE 1 TABLET BY MOUTH EVERY 4 TO 6 HOURS AS NEEDED FOR PAIN  ? IBUPROFEN (ADVIL) 200 MG TABLET    Take 400 mg by mouth every 6 (six) hours as needed for moderate pain.  ? METFORMIN (GLUCOPHAGE) 500 MG TABLET    TAKE ONE TABLET BY MOUTH TWICE DAILY  ? METOPROLOL TARTRATE (LOPRESSOR) 25 MG TABLET    TAKE ONE TABLET BY MOUTH TWICE DAILY  ? OMEPRAZOLE (PRILOSEC) 20 MG CAPSULE    Take 20 mg by mouth at bedtime.  ? SIMVASTATIN (ZOCOR) 20 MG TABLET    TAKE ONE TABLET BY MOUTH AT BEDTIME  ? TRAMADOL (ULTRAM) 50 MG TABLET    Take 1-2 tablets (50-100 mg total) by mouth 2 (two) times daily as needed.  ? TRAZODONE (DESYREL) 50 MG TABLET    Take 25-50 mg by mouth at bedtime as needed.  ? ZINC GLUCONATE 50 MG TABLET    Take 50 mg by mouth daily.  ?Modified Medications  ? No medications on file  ?Discontinued Medications  ?  DOXYCYCLINE (VIBRA-TABS) 100 MG TABLET    Take 1 tablet (100 mg total) by mouth 2 (two) times daily.  ? ? ?Subjective: ?Kristin [name] is in for her routine follow-up visit.  She underwent left total hip arthroplasty on 06/01/2016.  She developed acute infection with MRSA in January 2018.  She underwent incision and drainage with polyexchange and was discharged on IV vancomycin before converting to oral doxycycline.  She completed therapy in June 2018.  She has had intermittent boils form along her left hip incision since that time.  She has been treated with oral trimethoprim sulfamethoxazole.  She underwent scar revision on 01/19/2018.  No organisms were seen on Gram stain and cultures were negative.  Dr. Lyndee Leo Dillingham excised an 8 cm deep fistula on 07/17/2019.  There was  no evidence of infection noted in the operative report and no cultures were obtained.  A CT scan this past February showed some sclerosis and fluid around the prosthesis.  An x-ray on 11/12/2019 showed lucency around the femoral stem.  She stopped taking trimethoprim sulfamethoxazole on 12/13/2019 and was admitted on 12/23/2019 with a plan for probable excision arthroplasty.  At the time of surgery the prosthesis was noted to be firmly in place with sclerotic bone.  She underwent debridement and polyexchange again.  No organisms were seen on stain and cultures grew only rare Prevotella bivia.  Treated with IV vancomycin and oral metronidazole and rifampin.  She converted to oral doxycycline in early August 2021 and has been on it ever since. ? ?She developed acute swelling over her left hip in July 2022.  A CT scan showed an 8.2 x 5.3 cm thick rim fluid collection overlying the left greater trochanter.  There is also marked lucency along the bone prosthesis interface and evidence of left hip effusion and a/or synovitis.  She underwent aspiration of the fluid collection on 02/04/2021 yielding 140 cc of bloody fluid.  There were no crystals seen.  There were no organisms on Gram stain and cultures were negative.  She feels like the fluid has been reaccumulating since that time.   ? ?She was recently referred to see Dr. Carnella Guadalajara, with [facility] in [place].  She is scheduled for a left hip MRI scan on 10/27/2021 after which she will be set up for excision arthroplasty and spacer placement.  He stopped her doxycycline on 09/30/2021 to improve the yield of operative cultures. ? ?Review of Systems: ?Review of Systems  ?Constitutional:  Negative for fever and weight loss.  ?Gastrointestinal:  Negative for abdominal pain, diarrhea, nausea and vomiting.  ?Musculoskeletal:  Positive for joint pain.  ? ?Past Medical History:  ?Diagnosis Date  ? Allergy   ? seasonal  ? Anemia   ? Anxiety   ? Arthritis   ? Back pain, chronic    ? Borderline diabetes   ? Bronchitis   ? Constipation   ? current issue  ? Cough   ? Depression   ? Epilepsy ([place])   ? GERD (gastroesophageal reflux disease)   ? Hyperlipidemia   ? Hypertension   ? IBS (irritable bowel syndrome)   ? Joint pain   ? Obesity   ? Palpitations   ? Pneumonia 2016  ? Pre-diabetes   ? Renal sclerosis, unspecified   ? only has 1 kidney  ? Rheumatoid arthritis ([place])   ? Seizures ([place])   ? last >15 +  yrs ago- recorded 07-26-2021  ? Somatization disorder   ? Tobacco abuse   ? ? ?  Social History  ? ?Tobacco Use  ? Smoking status: Former  ?  Packs/day: 0.25  ?  Years: 0.50  ?  Pack years: 0.13  ?  Types: Cigarettes  ?  Quit date: 01/27/2014  ?  Years since quitting: 7.7  ? Smokeless tobacco: Never  ?Vaping Use  ? Vaping Use: Never used  ?Substance Use Topics  ? Alcohol use: No  ? Drug use: No  ? ? ?Family History  ?Problem Relation Age of Onset  ? Other Mother   ?     failed surgery  ? Heart Problems Father   ?     poss MI, not sure:per pt  ? Colon cancer Father   ? Colon cancer Maternal Uncle   ?     not sure age of onset  ? Alzheimer's disease Maternal Grandmother   ? Heart disease Maternal Grandfather   ? Colon cancer Other   ?     grandfather  ? Colon polyps Neg Hx   ? Esophageal cancer Neg Hx   ? Stomach cancer Neg Hx   ? Rectal cancer Neg Hx   ? ? ?Allergies  ?Allergen Reactions  ? Fluconazole Swelling and Other (See Comments)  ?  Face swelling  ? Robitussin (Alcohol Free) [Guaifenesin] Palpitations and Other (See Comments)  ?  Chest pain  ? ? ?Objective: ?Vitals:  ? 10/14/21 0847  ?BP: 140/84  ?Pulse: 89  ?Temp: (!) 97.1 ?F (36.2 ?C)  ?TempSrc: Temporal  ?Weight: 259 lb (117.5 kg)  ? ?Body mass index is 45.16 kg/m?. ? ?Physical Exam ?Constitutional:   ?   Comments: She is in no distress.  Her spirits are good.  ?Cardiovascular:  ?   Rate and Rhythm: Normal rate.  ?Pulmonary:  ?   Effort: Pulmonary effort is normal.  ?Musculoskeletal:  ?   Comments: She has a very large area of swelling over  the left hip.  There is no unusual redness or warmth.   ?Psychiatric:     ?   Mood and Affect: Mood normal.  ? ? ? ? ?  ?Problem List Items Addressed This Visit   ? ?  ? High  ? Prosthetic joint infection

## 2021-10-18 ENCOUNTER — Ambulatory Visit (INDEPENDENT_AMBULATORY_CARE_PROVIDER_SITE_OTHER): Payer: Medicaid Other | Admitting: Family Medicine

## 2021-10-18 ENCOUNTER — Encounter: Payer: Self-pay | Admitting: Family Medicine

## 2021-10-18 ENCOUNTER — Telehealth: Payer: Self-pay

## 2021-10-18 VITALS — BP 137/83 | HR 89 | Ht 63.5 in | Wt 257.8 lb

## 2021-10-18 DIAGNOSIS — E119 Type 2 diabetes mellitus without complications: Secondary | ICD-10-CM

## 2021-10-18 DIAGNOSIS — M25551 Pain in right hip: Secondary | ICD-10-CM | POA: Diagnosis not present

## 2021-10-18 DIAGNOSIS — M25552 Pain in left hip: Secondary | ICD-10-CM | POA: Diagnosis not present

## 2021-10-18 DIAGNOSIS — I10 Essential (primary) hypertension: Secondary | ICD-10-CM

## 2021-10-18 LAB — POCT GLYCOSYLATED HEMOGLOBIN (HGB A1C): HbA1c, POC (controlled diabetic range): 6.2 % (ref 0.0–7.0)

## 2021-10-18 MED ORDER — NAPROXEN 500 MG PO TABS: 500.0000 mg | ORAL_TABLET | Freq: Two times a day (BID) | ORAL | 0 refills | Status: DC

## 2021-10-18 NOTE — Patient Instructions (Addendum)
It was great seeing you today! ? ?Please check-out at the front desk before leaving the clinic. I'd like to see you back in 3 months but if you need to be seen earlier than that for any new issues we're happy to fit you in, just give [place] a call! ? ?Visit Remembers: ?- Stop by the pharmacy to pick up your prescriptions  ?- Continue to work on your healthy eating habits and incorporating exercise into your daily life.  ?- Your goal is to have an BP < 130/80 ?- Medicine Changes: start taking Naprosyn as needed for pain with food  ? ? ?Regarding lab work today:  ?Due to recent changes in healthcare laws, you may see the results of your imaging and laboratory studies on MyChart before your provider has had a chance to review them.  I understand that in some cases there may be results that are confusing or concerning to you. Not all laboratory results come back in the same time frame and you may be waiting for multiple results in order to interpret others.  Please give [place] 72 hours in order for your provider to thoroughly review all the results before contacting the office for clarification of your results. If everything is normal, you will get a letter in the mail or a message in My Chart. Please give [place] a call if you do not hear from [place] after 2 weeks. ? ?Please bring all of your medications with you to each visit.  ? ? ?Feel free to call with any questions or concerns at any time, at [phone]. ?  ?Take care,  ?Dr. Susa Simmonds ?[place]  ?

## 2021-10-18 NOTE — Telephone Encounter (Signed)
Patient has been updated.

## 2021-10-18 NOTE — Telephone Encounter (Signed)
Patient calls nurse line reporting she was given Naproxen today, however she was unsure if she mentioned getting a steroid injection.  ? ?Patient reports she got the injection ~2-3 weeks ago and wants to make sure there will be no interaction with Naproxen.  ? ?Will forward to provider who saw patient/PCP.  ?

## 2021-10-18 NOTE — Progress Notes (Signed)
? ?  SUBJECTIVE:  ? ?CHIEF COMPLAINT / HPI:  ? ?Chief Complaint  ?Patient presents with  ? Follow-up  ? ? ? ?Kristin [name] is a 66 y.o. female here for follow up:   ? ?HTN ?Takes HCTZ and Lopressor. Denies missing doses of antihypertensive medications. Denies chest pain, palpitations, lower extremity edema, exertional dyspnea, lightheadedness, and vision changes. Endorses intermittent headaches.   ? ? ?Diabetes Mellitus  ?Take metformin. Denies missing any doses of DM medications. Denies increased thirst, hunger, or frequent urination. Does not check her blood glucose regularly. She can not recall her last CBG.  ? ?Hip Pain ?Reports right hip pain and groin area. The "slightest little turn" makes the pain worse. Taking Tylenol. Has known left hip arthritis. Has upcoming surgery with Dr Tracie Harrier in [place]. Has staph in her hip. Follows with infection disease. Denies fevers or chills. Stopped the doxycyline for 2 weeks (3 weeks total) before she gets her MRI in [place].  ? ? ?PERTINENT  PMH / PSH: reviewed and updated as appropriate  ? ?OBJECTIVE:  ? ?BP 137/83   Pulse 89   Ht 5' 3.5" (1.613 m)   Wt 257 lb 12.8 oz (116.9 kg)   SpO2 99%   BMI 44.95 kg/m?   ? ?GEN: pleasant well appearing female, in no acute distress  ?CV: regular rate and rhythm ?RESP: no increased work of breathing, clear to ascultation bilaterally ?MSK: +FADIR, limited ROM due to pain ?SKIN: warm, dry ? ? ?ASSESSMENT/PLAN:  ? ?Diabetes ([place]) ?Continue current regimen: Metformin 1000 mg BID..  A1c today 6.2 and previously 6.0. Encouraged continued diet rich in vegetables and complex carbs.  Heart healthy carb modified diet. Counseled on need to continue exercising.  ? ?Statin therapy: Simvastatin  ?Urine microalbumine: colelct today  ? ? ?HYPERTENSION, BENIGN SYSTEMIC ?BP at goal.  Continue HCTZ 25 mg. Consider combo with ARB given hx of T2DM  ?  ? 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 She has known OA in both hips. Start Naprosyn 500 mg BID and Tylenol PRN. Follow up with orthopedic provider. ? ?Lyndee Hensen, DO ?PGY-3, [facility] ?10/18/2021  ? ? ? ? ? ? ? ? ?

## 2021-10-19 DIAGNOSIS — F331 Major depressive disorder, recurrent, moderate: Secondary | ICD-10-CM | POA: Diagnosis not present

## 2021-10-19 LAB — COMPREHENSIVE METABOLIC PANEL
ALT: 19 IU/L (ref 0–32)
AST: 12 IU/L (ref 0–40)
Albumin/Globulin Ratio: 2 (ref 1.2–2.2)
Albumin: 4.7 g/dL (ref 3.8–4.8)
Alkaline Phosphatase: 111 IU/L (ref 44–121)
BUN/Creatinine Ratio: 15 (ref 12–28)
BUN: 16 mg/dL (ref 8–27)
Bilirubin Total: <0.2 mg/dL (ref 0.0–1.2)
CO2: 25 mmol/L (ref 20–29)
Calcium: 10 mg/dL (ref 8.7–10.3)
Chloride: 100 mmol/L (ref 96–106)
Creatinine, Ser: 1.04 mg/dL — ABNORMAL HIGH (ref 0.57–1.00)
Globulin, Total: 2.4 g/dL (ref 1.5–4.5)
Glucose: 101 mg/dL — ABNORMAL HIGH (ref 70–99)
Potassium: 4.2 mmol/L (ref 3.5–5.2)
Sodium: 141 mmol/L (ref 134–144)
Total Protein: 7.1 g/dL (ref 6.0–8.5)
eGFR: 60 mL/min/{1.73_m2} (ref >59)

## 2021-10-19 LAB — LIPID PANEL
Chol/HDL Ratio: 2.9 ratio (ref 0.0–4.4)
Cholesterol, Total: 184 mg/dL (ref 100–199)
HDL: 63 mg/dL (ref >39)
LDL Chol Calc (NIH): 96 mg/dL (ref 0–99)
Triglycerides: 142 mg/dL (ref 0–149)
VLDL Cholesterol Cal: 25 mg/dL (ref 5–40)

## 2021-10-20 LAB — MICROALBUMIN / CREATININE URINE RATIO
Creatinine, Urine: 96.4 mg/dL
Microalb/Creat Ratio: <3 mg/g creat (ref 0–29)
Microalbumin, Urine: <3 ug/mL

## 2021-10-21 NOTE — Assessment & Plan Note (Signed)
BP at goal.  Continue HCTZ 25 mg. Consider combo with ARB given hx of T2DM  ?

## 2021-10-21 NOTE — Assessment & Plan Note (Signed)
Continue current regimen: Metformin 1000 mg BID..  A1c today 6.2 and previously 6.0. Encouraged continued diet rich in vegetables and complex carbs.  Heart healthy carb modified diet. Counseled on need to continue exercising.  ? ?Statin therapy: Simvastatin  ?Urine microalbumine: colelct today  ? ?

## 2021-10-21 NOTE — Assessment & Plan Note (Signed)
" >>  ASSESSMENT AND PLAN FOR HYPERTENSION, BENIGN SYSTEMIC WRITTEN ON 10/21/2021  2:17 PM BY Brolin Dambrosia, DO  BP at goal.  Continue HCTZ 25 mg. Consider combo with ARB given hx of T2DM  "

## 2021-10-22 DIAGNOSIS — Z96619 Presence of unspecified artificial shoulder joint: Secondary | ICD-10-CM | POA: Diagnosis not present

## 2021-10-27 DIAGNOSIS — T8452XA Infection and inflammatory reaction due to internal left hip prosthesis, initial encounter: Secondary | ICD-10-CM | POA: Diagnosis not present

## 2021-10-27 DIAGNOSIS — M1612 Unilateral primary osteoarthritis, left hip: Secondary | ICD-10-CM | POA: Diagnosis not present

## 2021-10-27 DIAGNOSIS — R2242 Localized swelling, mass and lump, left lower limb: Secondary | ICD-10-CM | POA: Diagnosis not present

## 2021-10-27 DIAGNOSIS — T8459XA Infection and inflammatory reaction due to other internal joint prosthesis, initial encounter: Secondary | ICD-10-CM | POA: Diagnosis not present

## 2021-10-27 DIAGNOSIS — Z96642 Presence of left artificial hip joint: Secondary | ICD-10-CM | POA: Diagnosis not present

## 2021-10-27 DIAGNOSIS — Z96649 Presence of unspecified artificial hip joint: Secondary | ICD-10-CM | POA: Diagnosis not present

## 2021-11-01 DIAGNOSIS — Z419 Encounter for procedure for purposes other than remedying health state, unspecified: Secondary | ICD-10-CM | POA: Diagnosis not present

## 2021-11-18 DIAGNOSIS — F411 Generalized anxiety disorder: Secondary | ICD-10-CM | POA: Diagnosis not present

## 2021-11-26 DIAGNOSIS — Z87898 Personal history of other specified conditions: Secondary | ICD-10-CM | POA: Insufficient documentation

## 2021-11-26 DIAGNOSIS — R569 Unspecified convulsions: Secondary | ICD-10-CM | POA: Insufficient documentation

## 2021-11-26 DIAGNOSIS — M1990 Unspecified osteoarthritis, unspecified site: Secondary | ICD-10-CM | POA: Insufficient documentation

## 2021-12-01 DIAGNOSIS — E890 Postprocedural hypothyroidism: Secondary | ICD-10-CM | POA: Diagnosis not present

## 2021-12-01 DIAGNOSIS — Z9049 Acquired absence of other specified parts of digestive tract: Secondary | ICD-10-CM | POA: Diagnosis not present

## 2021-12-01 DIAGNOSIS — Z6841 Body Mass Index (BMI) 40.0 and over, adult: Secondary | ICD-10-CM | POA: Diagnosis not present

## 2021-12-01 DIAGNOSIS — N179 Acute kidney failure, unspecified: Secondary | ICD-10-CM | POA: Diagnosis not present

## 2021-12-01 DIAGNOSIS — E119 Type 2 diabetes mellitus without complications: Secondary | ICD-10-CM | POA: Diagnosis not present

## 2021-12-01 DIAGNOSIS — T8452XA Infection and inflammatory reaction due to internal left hip prosthesis, initial encounter: Secondary | ICD-10-CM | POA: Diagnosis not present

## 2021-12-01 DIAGNOSIS — I10 Essential (primary) hypertension: Secondary | ICD-10-CM | POA: Diagnosis not present

## 2021-12-01 DIAGNOSIS — E785 Hyperlipidemia, unspecified: Secondary | ICD-10-CM | POA: Diagnosis not present

## 2021-12-01 DIAGNOSIS — R339 Retention of urine, unspecified: Secondary | ICD-10-CM | POA: Diagnosis not present

## 2021-12-01 DIAGNOSIS — K5900 Constipation, unspecified: Secondary | ICD-10-CM | POA: Diagnosis not present

## 2021-12-01 DIAGNOSIS — Z01818 Encounter for other preprocedural examination: Secondary | ICD-10-CM | POA: Diagnosis not present

## 2021-12-01 DIAGNOSIS — D62 Acute posthemorrhagic anemia: Secondary | ICD-10-CM | POA: Diagnosis not present

## 2021-12-01 DIAGNOSIS — M1990 Unspecified osteoarthritis, unspecified site: Secondary | ICD-10-CM | POA: Diagnosis not present

## 2021-12-02 DIAGNOSIS — Z419 Encounter for procedure for purposes other than remedying health state, unspecified: Secondary | ICD-10-CM | POA: Diagnosis not present

## 2021-12-06 ENCOUNTER — Other Ambulatory Visit: Payer: Self-pay | Admitting: Family Medicine

## 2021-12-07 ENCOUNTER — Encounter: Payer: Self-pay | Admitting: *Deleted

## 2021-12-08 DIAGNOSIS — E119 Type 2 diabetes mellitus without complications: Secondary | ICD-10-CM | POA: Diagnosis not present

## 2021-12-08 DIAGNOSIS — I10 Essential (primary) hypertension: Secondary | ICD-10-CM | POA: Insufficient documentation

## 2021-12-08 DIAGNOSIS — Z6841 Body Mass Index (BMI) 40.0 and over, adult: Secondary | ICD-10-CM | POA: Diagnosis not present

## 2021-12-08 DIAGNOSIS — T8452XA Infection and inflammatory reaction due to internal left hip prosthesis, initial encounter: Secondary | ICD-10-CM | POA: Diagnosis not present

## 2021-12-09 ENCOUNTER — Telehealth: Payer: Self-pay

## 2021-12-09 DIAGNOSIS — E1169 Type 2 diabetes mellitus with other specified complication: Secondary | ICD-10-CM | POA: Insufficient documentation

## 2021-12-09 DIAGNOSIS — T8452XA Infection and inflammatory reaction due to internal left hip prosthesis, initial encounter: Secondary | ICD-10-CM | POA: Diagnosis not present

## 2021-12-09 DIAGNOSIS — R079 Chest pain, unspecified: Secondary | ICD-10-CM | POA: Diagnosis not present

## 2021-12-09 DIAGNOSIS — Z01818 Encounter for other preprocedural examination: Secondary | ICD-10-CM | POA: Diagnosis not present

## 2021-12-09 DIAGNOSIS — T8459XA Infection and inflammatory reaction due to other internal joint prosthesis, initial encounter: Secondary | ICD-10-CM | POA: Insufficient documentation

## 2021-12-09 DIAGNOSIS — E119 Type 2 diabetes mellitus without complications: Secondary | ICD-10-CM | POA: Diagnosis not present

## 2021-12-09 NOTE — Telephone Encounter (Signed)
Dr. Raelene Bott calling regarding the patient and would like to speak to Dr. Megan Salon regarding the patient. She can be reached at [phone].

## 2021-12-10 DIAGNOSIS — E119 Type 2 diabetes mellitus without complications: Secondary | ICD-10-CM | POA: Diagnosis not present

## 2021-12-10 DIAGNOSIS — D62 Acute posthemorrhagic anemia: Secondary | ICD-10-CM | POA: Insufficient documentation

## 2021-12-10 DIAGNOSIS — T8452XA Infection and inflammatory reaction due to internal left hip prosthesis, initial encounter: Secondary | ICD-10-CM | POA: Diagnosis not present

## 2021-12-10 DIAGNOSIS — Z01818 Encounter for other preprocedural examination: Secondary | ICD-10-CM | POA: Diagnosis not present

## 2021-12-10 DIAGNOSIS — Z96642 Presence of left artificial hip joint: Secondary | ICD-10-CM | POA: Insufficient documentation

## 2021-12-10 DIAGNOSIS — N179 Acute kidney failure, unspecified: Secondary | ICD-10-CM | POA: Diagnosis not present

## 2021-12-11 DIAGNOSIS — E669 Obesity, unspecified: Secondary | ICD-10-CM | POA: Diagnosis not present

## 2021-12-11 DIAGNOSIS — I10 Essential (primary) hypertension: Secondary | ICD-10-CM | POA: Diagnosis not present

## 2021-12-11 DIAGNOSIS — D62 Acute posthemorrhagic anemia: Secondary | ICD-10-CM | POA: Diagnosis not present

## 2021-12-11 DIAGNOSIS — Z96649 Presence of unspecified artificial hip joint: Secondary | ICD-10-CM | POA: Diagnosis not present

## 2021-12-11 DIAGNOSIS — T849XXA Unspecified complication of internal orthopedic prosthetic device, implant and graft, initial encounter: Secondary | ICD-10-CM | POA: Diagnosis not present

## 2021-12-11 DIAGNOSIS — E1169 Type 2 diabetes mellitus with other specified complication: Secondary | ICD-10-CM | POA: Diagnosis not present

## 2021-12-12 DIAGNOSIS — E1169 Type 2 diabetes mellitus with other specified complication: Secondary | ICD-10-CM | POA: Diagnosis not present

## 2021-12-12 DIAGNOSIS — I10 Essential (primary) hypertension: Secondary | ICD-10-CM | POA: Diagnosis not present

## 2021-12-12 DIAGNOSIS — D62 Acute posthemorrhagic anemia: Secondary | ICD-10-CM | POA: Diagnosis not present

## 2021-12-12 DIAGNOSIS — T849XXA Unspecified complication of internal orthopedic prosthetic device, implant and graft, initial encounter: Secondary | ICD-10-CM | POA: Diagnosis not present

## 2021-12-12 DIAGNOSIS — E669 Obesity, unspecified: Secondary | ICD-10-CM | POA: Diagnosis not present

## 2021-12-12 DIAGNOSIS — Z96649 Presence of unspecified artificial hip joint: Secondary | ICD-10-CM | POA: Diagnosis not present

## 2021-12-13 DIAGNOSIS — D62 Acute posthemorrhagic anemia: Secondary | ICD-10-CM | POA: Diagnosis not present

## 2021-12-13 DIAGNOSIS — E1169 Type 2 diabetes mellitus with other specified complication: Secondary | ICD-10-CM | POA: Diagnosis not present

## 2021-12-13 DIAGNOSIS — Z96649 Presence of unspecified artificial hip joint: Secondary | ICD-10-CM | POA: Diagnosis not present

## 2021-12-13 DIAGNOSIS — T8452XA Infection and inflammatory reaction due to internal left hip prosthesis, initial encounter: Secondary | ICD-10-CM | POA: Diagnosis not present

## 2021-12-13 DIAGNOSIS — I10 Essential (primary) hypertension: Secondary | ICD-10-CM | POA: Diagnosis not present

## 2021-12-13 DIAGNOSIS — E119 Type 2 diabetes mellitus without complications: Secondary | ICD-10-CM | POA: Diagnosis not present

## 2021-12-13 DIAGNOSIS — E669 Obesity, unspecified: Secondary | ICD-10-CM | POA: Diagnosis not present

## 2021-12-13 DIAGNOSIS — T849XXA Unspecified complication of internal orthopedic prosthetic device, implant and graft, initial encounter: Secondary | ICD-10-CM | POA: Diagnosis not present

## 2021-12-13 DIAGNOSIS — R339 Retention of urine, unspecified: Secondary | ICD-10-CM | POA: Insufficient documentation

## 2021-12-14 ENCOUNTER — Telehealth: Payer: Self-pay

## 2021-12-14 DIAGNOSIS — T8452XA Infection and inflammatory reaction due to internal left hip prosthesis, initial encounter: Secondary | ICD-10-CM | POA: Diagnosis not present

## 2021-12-14 DIAGNOSIS — E119 Type 2 diabetes mellitus without complications: Secondary | ICD-10-CM | POA: Diagnosis not present

## 2021-12-14 NOTE — Telephone Encounter (Signed)
Called patient to schedule appointment for hospital follow up. Patient states she will being discharging from hospital tomorrow. Is scheduled for phone visit Thursday. Leatrice Jewels, RMA

## 2021-12-14 NOTE — Telephone Encounter (Signed)
-----   Message from Michel Bickers, MD sent at 12/14/2021 12:22 PM EDT ----- Regarding: phone visit Jazara was just released from the hospital on IV antibiotics.  Please schedule her for a phone visit with me this Thursday.  Thanks.

## 2021-12-16 ENCOUNTER — Encounter: Payer: Self-pay | Admitting: Internal Medicine

## 2021-12-16 ENCOUNTER — Other Ambulatory Visit: Payer: Self-pay

## 2021-12-16 ENCOUNTER — Other Ambulatory Visit: Payer: Self-pay | Admitting: Family Medicine

## 2021-12-16 ENCOUNTER — Telehealth: Payer: Self-pay

## 2021-12-16 ENCOUNTER — Ambulatory Visit (INDEPENDENT_AMBULATORY_CARE_PROVIDER_SITE_OTHER): Payer: Medicaid Other | Admitting: Internal Medicine

## 2021-12-16 VITALS — Ht 63.5 in | Wt 257.0 lb

## 2021-12-16 DIAGNOSIS — T8452XD Infection and inflammatory reaction due to internal left hip prosthesis, subsequent encounter: Secondary | ICD-10-CM

## 2021-12-16 DIAGNOSIS — M1612 Unilateral primary osteoarthritis, left hip: Secondary | ICD-10-CM | POA: Diagnosis not present

## 2021-12-16 DIAGNOSIS — T8450XA Infection and inflammatory reaction due to unspecified internal joint prosthesis, initial encounter: Secondary | ICD-10-CM | POA: Diagnosis not present

## 2021-12-16 NOTE — Telephone Encounter (Signed)
Transition Care Management Follow-up Telephone Call Date of discharge and from where: 12/15/2021 from [facility]  How have you been since you were released from the hospital? Patient stated that she is feeling well. Patient stated that she able to control the pain mostly with Tylenol. Patient stated that she has her children and husband to help her out. HH is scheduled to come out to help with PICC and wound care.  Any questions or concerns? No  Items Reviewed: Did the pt receive and understand the discharge instructions provided? Yes  Medications obtained and verified? Yes  Other? No  Any new allergies since your discharge? No  Dietary orders reviewed? No Do you have support at home? Yes   Home Care and Equipment/Supplies: Were home health services ordered? yes If so, what is the name of the agency? [facility]  Has the agency set up a time to come to the patient's home? yes Were any new equipment or medical supplies ordered?  Yes: [facility] What is the name of the medical supply agency? [facility] supplied prior to discharge Were you able to get the supplies/equipment? yes Do you have any questions related to the use of the equipment or supplies? No  Functional Questionnaire: (I = Independent and D = Dependent) ADLs: I  Bathing/Dressing- I  Meal Prep- D  Eating- I  Maintaining continence- Patient has a foley  Transferring/Ambulation- I with device  Managing Meds- I   Follow up appointments reviewed:  PCP Hospital f/u appt confirmed? No   Specialist Hospital f/u appt confirmed? Yes  Patient is calling Urology to schedule appt to remove foley. Patient is scheduled to see [facility] on 01/10/2022 at 2:30pm Are transportation arrangements needed? No  If their condition worsens, is the pt aware to call PCP or go to the Emergency Dept.? Yes Was the patient provided with contact information for the PCP's office or ED? Yes Was to pt encouraged to call back with questions or concerns?  Yes

## 2021-12-16 NOTE — Telephone Encounter (Signed)
Spoke to [place] at [facility] [phone]. She verified the correct OPAT plan: Vanc 1500 every 12 hours/Ceftriaxone 2 G every 24 hours via IV.  Labs will include : CBCw Diff, LFP, ESR, CRP weekly  Creatinine, BUN, and Vanc [place] twice a week.  HH agreed to fax all lab results to Dr Megan Salon. [phone]  OPAT begin date: [facility] Discharge 12/13/21 OPAT end date: 01/20/22  FIRST HH/LAB Visit: 12/16/21  Sent this OPAT update to [facility] Triage/[facility] pharmacy team/[facility] team  This patient originally began IV abx at [facility] inpatient(12/13/21) and Dr Megan Salon spoke to attending.  6 week treatment plan. Patient seen via phone visit 12/16/21 [facility] and follow in 1 month.

## 2021-12-16 NOTE — Progress Notes (Signed)
Virtual Visit via Telephone Note  I connected with Kristin [name] on 12/16/21 at 10:15 AM EDT by telephone and verified that I am speaking with the correct person using two identifiers.  Location: Patient: Home Provider: [facility]   I discussed the limitations, risks, security and privacy concerns of performing an evaluation and management service by telephone and the availability of in person appointments. I also discussed with the patient that there may be a patient responsible charge related to this service. The patient expressed understanding and agreed to proceed.   History of Present Illness: I called and spoke with Kristin [name] today.  She was hospitalized in [place] last week and underwent resection arthroplasty with spacer placement of her left prosthetic hip.  She had been struggling with smoldering prosthetic hip infection since 2018 wound cultures grew MRSA.  Repeat cultures in June 2021 grew rare Prevotella.  Operative Gram stain's last week and did not reveal any organisms and all cultures were negative.  She was discharged on IV vancomycin, ceftriaxone and oral metronidazole.  She denies any problems tolerating her PICC line or antibiotics.  She is having very little wound drainage.  Her VAC dressing was removed this morning.  She is working with [employer].   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  Follow Up Instructions: Continue vancomycin, ceftriaxone and metronidazole Follow-up in 4 weeks Antibiotic stop date is 01/20/2022  Diagnosis: Left prosthetic hip infection  Culture Result: MRSA and Prevotella  Allergies  Allergen Reactions   Fluconazole Swelling and Other (See Comments)    Face swelling   Robitussin Dm Max Day-Night Palpitations   Robitussin (Alcohol Free) [Guaifenesin] Palpitations  and Other (See Comments)    Chest pain    OPAT Orders Discharge antibiotics to be given via PICC line Discharge antibiotics: Per pharmacy protocol vancomycin, ceftriaxone and oral metronidazole Aim for Vancomycin trough 15-20 or AUC 400-550 (unless otherwise indicated) Duration: 6 weeks End Date: 01/20/2022  [facility] Care Per Protocol:  Home health RN for IV administration and teaching; PICC line care and labs.    Labs weekly while on IV antibiotics: _x_ CBC with differential _x_ BMP __ CMP _x_ CRP _x_ ESR _x_ Vancomycin trough __ CK  _x_ Please pull PIC at completion of IV antibiotics __ Please leave PIC in place until doctor has seen patient or been notified  Fax weekly labs to [phone]  Clinic Follow Up Appt: 01/13/2022    I discussed the assessment and treatment plan with the patient. The patient was provided an opportunity to ask questions and all were answered. The patient agreed with the plan and demonstrated an understanding of the instructions.   The patient was advised to call back or seek an in-person evaluation if the symptoms worsen or if the condition fails to improve as anticipated.  I provided 18 minutes of non-face-to-face time during this encounter.   Michel Bickers, MD

## 2021-12-17 NOTE — Telephone Encounter (Signed)
Per Carolynn Sayers:  Patient wil not be [facility] client. She was set up with [facility] when she was DC'd from [place]. Pam  spoke with [facility] at [place] and the contact at [place] is  Charlett Blake [phone]   Will need to contact if any IV Med changes

## 2021-12-20 ENCOUNTER — Telehealth: Payer: Self-pay | Admitting: *Deleted

## 2021-12-20 DIAGNOSIS — R339 Retention of urine, unspecified: Secondary | ICD-10-CM

## 2021-12-20 NOTE — Telephone Encounter (Signed)
Received a message from Apolonio Schneiders at [facility].  Patient is needing a referral for urology here in [place].  Will forward to MD.  Hospital notes in care everywhere, I was unable to tell if patient is still there.    Apolonio Schneiders can be reached at [phone].  Thanks [employer]

## 2021-12-21 DIAGNOSIS — M1612 Unilateral primary osteoarthritis, left hip: Secondary | ICD-10-CM | POA: Diagnosis not present

## 2021-12-21 DIAGNOSIS — T8450XA Infection and inflammatory reaction due to unspecified internal joint prosthesis, initial encounter: Secondary | ICD-10-CM | POA: Diagnosis not present

## 2021-12-22 ENCOUNTER — Other Ambulatory Visit: Payer: Self-pay

## 2021-12-22 ENCOUNTER — Telehealth: Payer: Self-pay | Admitting: Orthopaedic Surgery

## 2021-12-22 DIAGNOSIS — M1611 Unilateral primary osteoarthritis, right hip: Secondary | ICD-10-CM

## 2021-12-22 NOTE — Telephone Encounter (Signed)
Referral placed to Urology for urinary retention.   Lyndee Hensen, DO

## 2021-12-22 NOTE — Telephone Encounter (Signed)
Spoke with patient. She is wanting a referral to a urologist since she has a catheter in place from surgery in [place]. She lives here. I spoke with [name] and placed a referral to [facility]. She also wants a jury duty note. [name] states that she needs to get this from doctor in [place]. Patient states understanding.

## 2021-12-22 NOTE — Telephone Encounter (Signed)
Pt called requesting a call back from [employer]. Pt is asking for a referral to be sent to a neuro. Please call pt about this matter at [phone].

## 2022-01-01 DIAGNOSIS — Z419 Encounter for procedure for purposes other than remedying health state, unspecified: Secondary | ICD-10-CM | POA: Diagnosis not present

## 2022-01-03 DIAGNOSIS — T8450XA Infection and inflammatory reaction due to unspecified internal joint prosthesis, initial encounter: Secondary | ICD-10-CM | POA: Diagnosis not present

## 2022-01-03 DIAGNOSIS — M1612 Unilateral primary osteoarthritis, left hip: Secondary | ICD-10-CM | POA: Diagnosis not present

## 2022-01-06 DIAGNOSIS — T8450XA Infection and inflammatory reaction due to unspecified internal joint prosthesis, initial encounter: Secondary | ICD-10-CM | POA: Diagnosis not present

## 2022-01-06 DIAGNOSIS — M1612 Unilateral primary osteoarthritis, left hip: Secondary | ICD-10-CM | POA: Diagnosis not present

## 2022-01-10 DIAGNOSIS — R339 Retention of urine, unspecified: Secondary | ICD-10-CM | POA: Diagnosis not present

## 2022-01-10 DIAGNOSIS — M1612 Unilateral primary osteoarthritis, left hip: Secondary | ICD-10-CM | POA: Diagnosis not present

## 2022-01-10 DIAGNOSIS — T8450XA Infection and inflammatory reaction due to unspecified internal joint prosthesis, initial encounter: Secondary | ICD-10-CM | POA: Diagnosis not present

## 2022-01-10 DIAGNOSIS — Z4789 Encounter for other orthopedic aftercare: Secondary | ICD-10-CM | POA: Diagnosis not present

## 2022-01-11 DIAGNOSIS — F4310 Post-traumatic stress disorder, unspecified: Secondary | ICD-10-CM | POA: Diagnosis not present

## 2022-01-13 ENCOUNTER — Encounter: Payer: Self-pay | Admitting: Internal Medicine

## 2022-01-13 ENCOUNTER — Other Ambulatory Visit: Payer: Self-pay

## 2022-01-13 ENCOUNTER — Ambulatory Visit (INDEPENDENT_AMBULATORY_CARE_PROVIDER_SITE_OTHER): Payer: Medicaid Other | Admitting: Internal Medicine

## 2022-01-13 DIAGNOSIS — T8450XA Infection and inflammatory reaction due to unspecified internal joint prosthesis, initial encounter: Secondary | ICD-10-CM | POA: Diagnosis not present

## 2022-01-13 DIAGNOSIS — T8452XD Infection and inflammatory reaction due to internal left hip prosthesis, subsequent encounter: Secondary | ICD-10-CM | POA: Diagnosis not present

## 2022-01-13 DIAGNOSIS — M1612 Unilateral primary osteoarthritis, left hip: Secondary | ICD-10-CM | POA: Diagnosis not present

## 2022-01-13 NOTE — Progress Notes (Signed)
Virtual Visit via Telephone Note  I connected with Kristin [name] on 01/13/22 at 11:30 AM EDT by telephone and verified that I am speaking with the correct person using two identifiers.  Location: Patient: Home Provider: [facility]   I discussed the limitations, risks, security and privacy concerns of performing an evaluation and management service by telephone and the availability of in person appointments. I also discussed with the patient that there may be a patient responsible charge related to this service. The patient expressed understanding and agreed to proceed.   History of Present Illness: I called and spoke with Kristin [name] today.  She continues on ceftriaxone, vancomycin and metronidazole following resection arthroplasty of her left prosthetic hip with spacer placement on 12/08/2021.  She has not had any problems tolerating her PICC or antibiotics.  She has not had any fever, nausea, vomiting or diarrhea.  She tells me that her constipation has improved while on antibiotics.  She went back to see her urologist on 01/10/2022 and had her Foley catheter removed which had been placed recently after she developed urinary retention.  While at that clinic her left foot got stuck when she was transferring from her wheelchair to the table and she felt a "pop" in her left hip.  It was quite painful.  She was seen in follow-up that afternoon and Dr. Florinda Marker office.  Her left hip surgical incision was noted to have healed nicely.  Plain x-rays did not reveal any fracture or problems with the spacer.  She has pretty much been bed ridden since that time and having to use a bedpan.  She tells me that her left hip pain is slowly improving.   Observations/Objective: I have not received any of her routine blood work results from her home health agency  Assessment and Plan: She is having some increased left hip pain following recent trauma but it does not appear that there is any persistent left hip  infection.  Follow Up Instructions: Obtain recent lab work results Continue current antibiotics through 01/20/2022 then have PICC removed Up in 4 weeks   I discussed the assessment and treatment plan with the patient. The patient was provided an opportunity to ask questions and all were answered. The patient agreed with the plan and demonstrated an understanding of the instructions.   The patient was advised to call back or seek an in-person evaluation if the symptoms worsen or if the condition fails to improve as anticipated.  I provided 16 minutes of non-face-to-face time during this encounter.   Michel Bickers, MD

## 2022-01-13 NOTE — Progress Notes (Signed)
No change to original OPAT orders. Orders faxed to [facility] at [phone] and [facility] at [phone]. Awaiting most recent labs from 01/11/22 by [facility] to [place]. Nursing staff did confirm over the phone that last Creatinine level was 0.94.  Kristin [name]

## 2022-01-17 DIAGNOSIS — M1612 Unilateral primary osteoarthritis, left hip: Secondary | ICD-10-CM | POA: Diagnosis not present

## 2022-01-17 DIAGNOSIS — T8450XA Infection and inflammatory reaction due to unspecified internal joint prosthesis, initial encounter: Secondary | ICD-10-CM | POA: Diagnosis not present

## 2022-01-27 DIAGNOSIS — F331 Major depressive disorder, recurrent, moderate: Secondary | ICD-10-CM | POA: Diagnosis not present

## 2022-02-01 DIAGNOSIS — Z419 Encounter for procedure for purposes other than remedying health state, unspecified: Secondary | ICD-10-CM | POA: Diagnosis not present

## 2022-02-08 ENCOUNTER — Other Ambulatory Visit: Payer: Self-pay

## 2022-02-08 DIAGNOSIS — F4310 Post-traumatic stress disorder, unspecified: Secondary | ICD-10-CM | POA: Diagnosis not present

## 2022-02-08 MED ORDER — TRAZODONE HCL 50 MG PO TABS: 25.0000 mg | ORAL_TABLET | Freq: Every evening | ORAL | 0 refills | Status: DC | PRN

## 2022-02-08 MED ORDER — AMITRIPTYLINE HCL 75 MG PO TABS: 150.0000 mg | ORAL_TABLET | Freq: Every day | ORAL | 0 refills | Status: DC

## 2022-02-09 ENCOUNTER — Encounter (INDEPENDENT_AMBULATORY_CARE_PROVIDER_SITE_OTHER): Payer: Self-pay

## 2022-02-10 ENCOUNTER — Encounter: Payer: Self-pay | Admitting: Internal Medicine

## 2022-02-10 ENCOUNTER — Other Ambulatory Visit: Payer: Self-pay

## 2022-02-10 ENCOUNTER — Ambulatory Visit (INDEPENDENT_AMBULATORY_CARE_PROVIDER_SITE_OTHER): Payer: Medicaid Other | Admitting: Internal Medicine

## 2022-02-10 DIAGNOSIS — T8452XD Infection and inflammatory reaction due to internal left hip prosthesis, subsequent encounter: Secondary | ICD-10-CM

## 2022-02-10 NOTE — Progress Notes (Signed)
Virtual Visit via Telephone Note  I connected with Kristin [name] on 02/10/22 at  9:00 AM EDT by telephone and verified that I am speaking with the correct person using two identifiers.  Location: Patient: Home Provider: [facility]   I discussed the limitations, risks, security and privacy concerns of performing an evaluation and management service by telephone and the availability of in person appointments. I also discussed with the patient that there may be a patient responsible charge related to this service. The patient expressed understanding and agreed to proceed.   History of Present Illness: I called and spoke with Kristin [name] this morning.  She underwent left hip resection arthroplasty with spacer placement on 12/08/2021.  He was discharged on ceftriaxone, vancomycin and metronidazole and completed 6 weeks of therapy on 01/20/2022.  She tolerated her antibiotics well.  Her PICC has been removed.  She has not had any recent fever or new swelling, redness or pain around her left hip incision.  She has not scheduled to follow-up with her orthopedic surgeon, Dr. Carnella Guadalajara until November.   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  Follow Up Instructions: Observe off of antibiotics Phone follow-up on 03/31/2022   I discussed the assessment and treatment plan with the patient. The patient was provided an opportunity to ask questions and all were answered. The patient agreed with the plan and demonstrated an understanding of the instructions.   The patient was advised to call back or seek an in-person evaluation if the symptoms worsen or if the condition fails to improve as anticipated.  I provided 15 minutes of non-face-to-face time during this encounter.   Michel Bickers, MD

## 2022-02-24 DIAGNOSIS — F331 Major depressive disorder, recurrent, moderate: Secondary | ICD-10-CM | POA: Diagnosis not present

## 2022-02-25 DIAGNOSIS — Z96642 Presence of left artificial hip joint: Secondary | ICD-10-CM | POA: Diagnosis not present

## 2022-02-25 DIAGNOSIS — Z4789 Encounter for other orthopedic aftercare: Secondary | ICD-10-CM | POA: Diagnosis not present

## 2022-03-02 ENCOUNTER — Other Ambulatory Visit: Payer: Self-pay | Admitting: Family Medicine

## 2022-03-03 NOTE — Telephone Encounter (Signed)
NEEDS VISIT FOR LIPID PANEL BEFORE NEXT REFILL

## 2022-03-10 DIAGNOSIS — F4310 Post-traumatic stress disorder, unspecified: Secondary | ICD-10-CM | POA: Diagnosis not present

## 2022-03-31 ENCOUNTER — Ambulatory Visit (INDEPENDENT_AMBULATORY_CARE_PROVIDER_SITE_OTHER): Payer: Medicaid Other | Admitting: Internal Medicine

## 2022-03-31 ENCOUNTER — Other Ambulatory Visit: Payer: Self-pay

## 2022-03-31 DIAGNOSIS — T8452XD Infection and inflammatory reaction due to internal left hip prosthesis, subsequent encounter: Secondary | ICD-10-CM

## 2022-03-31 NOTE — Progress Notes (Signed)
Virtual Visit via Video Note  I connected with Kristin [name] on 03/31/22 at 11:30 AM EDT by a video enabled telemedicine application and verified that I am speaking with the correct person using two identifiers.  Location: Patient: Home Provider: [facility]   I discussed the limitations of evaluation and management by telemedicine and the availability of in person appointments. The patient expressed understanding and agreed to proceed.  History of Present Illness: I called and spoke with Kristin [name] today.  She had been struggling with chronic, smoldering left prosthetic hip infection.  She had failed surgery and an attempt to control it with chronic suppressive antibiotic therapy.  She underwent left hip resection arthroplasty and spacer placement on 12/08/2021.  She received 6 weeks of postoperative ceftriaxone, vancomycin and metronidazole completing therapy on 01/20/2022.  She did not have any problems tolerating her antibiotics or PICC which has now been removed.  She continues to have some pain, numbness and swelling around her left hip incision.  She rates the pain as 3 out of 10.  The swelling has not been getting worse.  She has no redness or unusual warmth around the incision.  Her incision is completely healed.  She has not had any fever, chills or sweats.  She is scheduled to follow-up with her orthopedic surgeon, Carnella Guadalajara, on 05/02/2022.  She says that she has been having some intermittent dental pain.  She has not seen her dentist for several years.  She has taken a few, intermittent doses of doxycycline because of the dental pain.  She estimates that she has taken 1 dose weekly over the past 2 months.   Observations/Objective:   Assessment and Plan: I am hopeful that her infection has been cured after her most recent surgery.  I encouraged her to schedule a follow-up visit with her surgeon.  I also told her that she should not be taking doxycycline at all.  I prefer to have her off of  antibiotics leading up to her follow-up visit with Dr. Tracie Harrier.  Follow Up Instructions: Discontinue doxycycline and observe off of all antibiotic therapy for now Follow-up here on 05/05/2022   I discussed the assessment and treatment plan with the patient. The patient was provided an opportunity to ask questions and all were answered. The patient agreed with the plan and demonstrated an understanding of the instructions.   The patient was advised to call back or seek an in-person evaluation if the symptoms worsen or if the condition fails to improve as anticipated.  I provided 16 minutes of non-face-to-face time during this encounter.   Michel Bickers, MD

## 2022-04-12 ENCOUNTER — Other Ambulatory Visit: Payer: Self-pay

## 2022-04-13 MED ORDER — TRAZODONE HCL 50 MG PO TABS: 25.0000 mg | ORAL_TABLET | Freq: Every evening | ORAL | 0 refills | Status: DC | PRN

## 2022-04-13 MED ORDER — AMITRIPTYLINE HCL 75 MG PO TABS
150.0000 mg | ORAL_TABLET | Freq: Every day | ORAL | 0 refills | Status: DC
Start: 2022-04-13 — End: 2022-04-18

## 2022-04-18 ENCOUNTER — Telehealth: Payer: Self-pay

## 2022-04-18 NOTE — Telephone Encounter (Signed)
[facility] calls nurse line requesting refills for Trazodone and Amitriptyline.   These were called in to [facility] on 10/11.  I have called [facility] and cancelled prescription after speaking with patient. She prefers [facility] as they do pill packs for her.   Please resend.

## 2022-04-19 MED ORDER — AMITRIPTYLINE HCL 75 MG PO TABS
150.0000 mg | ORAL_TABLET | Freq: Every day | ORAL | 0 refills | Status: DC
Start: 2022-04-19 — End: 2022-08-15

## 2022-04-19 MED ORDER — TRAZODONE HCL 50 MG PO TABS: 25.0000 mg | ORAL_TABLET | Freq: Every evening | ORAL | 0 refills | Status: DC | PRN

## 2022-05-05 ENCOUNTER — Other Ambulatory Visit: Payer: Self-pay

## 2022-05-05 ENCOUNTER — Ambulatory Visit (INDEPENDENT_AMBULATORY_CARE_PROVIDER_SITE_OTHER): Payer: Medicare Other | Admitting: Internal Medicine

## 2022-05-05 DIAGNOSIS — T8452XD Infection and inflammatory reaction due to internal left hip prosthesis, subsequent encounter: Secondary | ICD-10-CM

## 2022-05-05 NOTE — Progress Notes (Signed)
Virtual Visit via Video Note  I connected with Kristin [name] on 05/05/22 at 11:00 AM EDT by a video enabled telemedicine application and verified that I am speaking with the correct person using two identifiers.  Location: Patient: Home Provider: [facility]   I discussed the limitations of evaluation and management by telemedicine and the availability of in person appointments. The patient expressed understanding and agreed to proceed.  History of Present Illness: I called and spoke with Kristin [name] today.  She has not had any new swelling, pain or fever to suggest that her left hip infection has returned.  She saw her orthopedic surgeon, Dr. Carnella Guadalajara on 05/02/2022.  5 cc of serosanguineous fluid were aspirated from her left hip.  Studies revealed 14,775 nucleated cells.  There were no crystals.  I cannot find any results of Gram stain or culture.  She tells me that she is scheduled for revision arthroplasty next week.   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  Follow Up Instructions: Phone follow-up in 4 weeks   I discussed the assessment and treatment plan with the patient. The patient was provided an opportunity to ask questions and all were answered. The patient agreed with the plan and demonstrated an understanding of the instructions.   The patient was advised to call back or seek an in-person evaluation if the symptoms worsen or if the condition fails to improve as anticipated.  I provided 16 minutes of non-face-to-face time during this encounter.   Michel Bickers, MD

## 2022-05-09 ENCOUNTER — Telehealth: Payer: Self-pay

## 2022-05-09 NOTE — Telephone Encounter (Signed)
Patient called wanting to pass along information to Dr. Madison Direnzo Salon from Dr. Tracie Harrier. She states that the cultures from the fluid were negative, but that her WBC were elevated. Faithe thinks this elevation may be due to a possible tooth infection.   She is scheduled for surgery 11/8 and says that depending on what things look like when they get in there, they may do another spacer or a full hip replacement.   Beryle Flock, RN

## 2022-05-10 ENCOUNTER — Other Ambulatory Visit: Payer: Self-pay

## 2022-05-11 MED ORDER — HYDROCHLOROTHIAZIDE 25 MG PO TABS: 25.0000 mg | ORAL_TABLET | Freq: Every day | ORAL | 2 refills | Status: DC

## 2022-05-11 MED ORDER — SIMVASTATIN 20 MG PO TABS: 20.0000 mg | ORAL_TABLET | Freq: Every day | ORAL | 2 refills | Status: DC

## 2022-06-01 ENCOUNTER — Ambulatory Visit: Payer: Medicare Other | Admitting: Student

## 2022-06-01 NOTE — Progress Notes (Deleted)
SUBJECTIVE:   Chief compliant/HPI: annual examination  Kristin [name] is a 66 y.o. who presents today for an annual exam.   Patient has a history of chronically infected left total hip replacement, had left direct anterior total hip replacement performed 06/01/2016.  Did this was complicated further by acute postoperative infection she had complete debridement, antibiotics, and implant retention in 2018 with cultures positive for MRSA.  At that time, she was treated for 6 weeks of IV Vanc and chronic Doxy.  In July 2019 she had an incision and drainage with closure of her anterior wound.  She had a sinus recur after approximately 3 to 4 months of the procedure.  She was referred to plastic surgery at that time who performed wound excision and closure.  Then, she was changed to oral Bactrim and had recurrence of her wound infection.  She then had a washout on 12/23/2019.  Cultures then grew Prevotella and she was started on vancomycin, rifampin, Flagyl.  Once again, she was transitioned to oral chronic doxycycline.  She was last seen by orthopedic surgery at [facility] on 05/02/2022 and had an elevated cell count through aspiration with increased ESR and CRP.  After discussion, patient underwent reimplantation of previously infected left total hip.  She was last seen by Ortho 05/30/2022 and had been doing well, had one of her drains removed.  Additionally,  Patient has history of hypertension for which she has been taking hydrochlorothiazide and metoprolol.  She reports today***. Most recent creatinine trend:  Lab Results  Component Value Date   CREATININE 1.04 (H) 10/18/2021   CREATININE 1.10 (H) 12/26/2019   CREATININE 1.15 (H) 12/25/2019   Patient has had a BMP in the past 1 year.  DM2:  Home medications include: ***. {Blank single:19197::"Does","Does not"} endorse compliance. Home glucose monitoring {home testing:315145}.   Most recent A1Cs:  Lab Results  Component Value Date    HGBA1C 6.2 10/18/2021   HGBA1C 6.0 (H) 12/24/2019   Last Microalbumin, LDL, Creatinine: Lab Results  Component Value Date   MICROALBUR 30 10/30/2019   LDLCALC 96 10/18/2021   CREATININE 1.04 (H) 10/18/2021    Patient {rwisisnot:24883} up to date on diabetic eye. Patient {rwisisnot:24883} up to date on diabetic foot exam.    History tabs reviewed and updated ***.   Review of systems form reviewed and notable for ***.   OBJECTIVE:   There were no vitals taken for this visit.  ***  ASSESSMENT/PLAN:   No problem-specific Assessment & Plan notes found for this encounter.    Annual Examination  See AVS for age appropriate recommendations  PHQ score     05/05/2022   11:03 AM 03/31/2022   11:38 AM 01/13/2022   11:34 AM  PHQ9 SCORE ONLY  PHQ-9 Total Score 1 1 0    BP reviewed and at goal ***.  Asked about intimate partner violence and resources given as appropriate  Advance directives discussion ***  Considered the following items based upon USPSTF recommendations: Diabetes screening: HA1C 05/11/22 6.1.  Screening for elevated cholesterol: LDL 96 10/18/21  HIV testing: NR 7 years ago  Hepatitis C: negative 6 years ago  Hepatitis B: not indicated  Syphilis if at high risk: discussed and not indicated  GC/CT {GC/CT screening :23818} Osteoporosis screening considered based upon risk of fracture from [facility] calculator. Major osteoporotic fracture risk is ***%. DEXA {ordered not order:23822}.  Reviewed risk factors for latent tuberculosis and {not indicated/requested/declined:14582}   Discussed family history, BRCA  testing {not indicated/requested/declined:14582}. Tool used to risk stratify was ***.  Cervical cancer screening: {PAPTYPE:23819} Breast cancer screening: {mammoscreen:23820} Colorectal cancer screening: {crcscreen:23821::"discussed, colonoscopy ordered"} Lung cancer screening: {discussed/declined/written info:19698}. See documentation below regarding  indications/risks/benefits.  Vaccinations ***.   Follow up in 1 *** year or sooner if indicated.    Erskine Emery, MD [place]

## 2022-06-02 ENCOUNTER — Ambulatory Visit (INDEPENDENT_AMBULATORY_CARE_PROVIDER_SITE_OTHER): Payer: Medicare Other | Admitting: Internal Medicine

## 2022-06-02 ENCOUNTER — Other Ambulatory Visit: Payer: Self-pay

## 2022-06-02 ENCOUNTER — Encounter: Payer: Self-pay | Admitting: Internal Medicine

## 2022-06-02 DIAGNOSIS — T8452XD Infection and inflammatory reaction due to internal left hip prosthesis, subsequent encounter: Secondary | ICD-10-CM

## 2022-06-02 NOTE — Progress Notes (Signed)
Virtual Visit via Video Note  I connected with Kristin [name] on 06/02/22 at 10:15 AM EST by a video enabled telemedicine application and verified that I am speaking with the correct person using two identifiers.  Location: Patient: Home Provider: [facility]   I discussed the limitations of evaluation and management by telemedicine and the availability of in person appointments. The patient expressed understanding and agreed to proceed.  History of Present Illness: Kristin [name] underwent revision arthroplasty of her left hip on 05/11/2022.  Operative Gram stain and cultures were negative.  She is continue to have some postop wound drainage.  1 of 2 drains was removed last week.  She had been off of all antibiotics for several months before that surgery.  She was instructed by her surgeon to restart doxycycline postop.  She describes the drainage is serosanguineous.  She has not had any fever, chills or sweats.  She still has some swelling around the incision but no redness or unusual warmth   Observations/Objective: Left hip operative Gram stain 05/11/2022 no organisms seen Left hip culture 05/11/2022 negative  Assessment and Plan: I am quite hopeful that her chronic left hip infection has been cured through a combination of the previous resection arthroplasty and a long course of antibiotics before her most recent revision arthroplasty.  Follow Up Instructions: I recommend stopping doxycycline now Follow-up in 1 month   I discussed the assessment and treatment plan with the patient. The patient was provided an opportunity to ask questions and all were answered. The patient agreed with the plan and demonstrated an understanding of the instructions.   The patient was advised to call back or seek an in-person evaluation if the symptoms worsen or if the condition fails to improve as anticipated.  I provided 16 minutes of non-face-to-face time during this encounter.   Michel Bickers, MD

## 2022-06-22 ENCOUNTER — Ambulatory Visit (INDEPENDENT_AMBULATORY_CARE_PROVIDER_SITE_OTHER): Payer: Medicare Other | Admitting: Internal Medicine

## 2022-06-22 ENCOUNTER — Other Ambulatory Visit: Payer: Self-pay

## 2022-06-22 DIAGNOSIS — T8452XD Infection and inflammatory reaction due to internal left hip prosthesis, subsequent encounter: Secondary | ICD-10-CM | POA: Diagnosis not present

## 2022-06-22 NOTE — Progress Notes (Signed)
Virtual Visit via Video Note  I connected with Kristin [name] on 06/22/22 at  9:00 AM EST by a video enabled telemedicine application and verified that I am speaking with the correct person using two identifiers.  Location: Patient: Home Provider: [facility]   I discussed the limitations of evaluation and management by telemedicine and the availability of in person appointments. The patient expressed understanding and agreed to proceed.  History of Present Illness: Kristin [name] underwent revision arthroplasty of her left hip on 05/11/2022.  Operative Gram stain and cultures were negative.  She is continue to have some postop wound drainage.  1 of 2 drains was removed last week.  She had been off of all antibiotics for several months before that surgery.  She was instructed by her surgeon to restart doxycycline postop.  I instructed her to stop taking doxycycline on 06/02/2022.  Her drains have been removed and her wound has healed.  She is still working with PT.   Observations/Objective: Left hip operative Gram stain 05/11/2022 no organisms seen Left hip culture 05/11/2022 negative  Assessment and Plan: I am quite hopeful that her chronic left hip infection has been cured through a combination of the previous resection arthroplasty and a long course of antibiotics before her most recent revision arthroplasty.  Follow Up Instructions: Follow-up here as needed   I discussed the assessment and treatment plan with the patient. The patient was provided an opportunity to ask questions and all were answered. The patient agreed with the plan and demonstrated an understanding of the instructions.   The patient was advised to call back or seek an in-person evaluation if the symptoms worsen or if the condition fails to improve as anticipated.  I provided 14 minutes of non-face-to-face time during this encounter.   Michel Bickers, MD

## 2022-06-30 ENCOUNTER — Telehealth: Payer: Self-pay

## 2022-06-30 NOTE — Telephone Encounter (Signed)
Received VM from Tillie Rung, [place] with [place] regarding patient. Patient was exposed to sick granddaughter over the holidays and is now not feeling well.   Reports cough, and fever. During PT assessment, patient had slight crackles on right side. Sp02 ranging from 91-96% RA. Home COVID test was negative.   She discussed ED precautions with patient and is requesting verbal orders for nursing to see patient.   Returned call to PT, LVM providing verbal orders.   Attempted to reach out to patient, no response.   Will attempt to reach patient at later time.   Talbot Grumbling, RN

## 2022-07-13 ENCOUNTER — Other Ambulatory Visit: Payer: Self-pay

## 2022-07-14 NOTE — Telephone Encounter (Signed)
Spoke with patient made appt for 1/16 at 2:10. Patient also want to get flu shot as well.Salvatore Marvel, CMA

## 2022-07-19 ENCOUNTER — Ambulatory Visit (INDEPENDENT_AMBULATORY_CARE_PROVIDER_SITE_OTHER): Payer: 59 | Admitting: Student

## 2022-07-19 ENCOUNTER — Encounter: Payer: Self-pay | Admitting: Student

## 2022-07-19 VITALS — BP 133/66 | HR 102 | Ht 63.5 in | Wt 267.2 lb

## 2022-07-19 DIAGNOSIS — I10 Essential (primary) hypertension: Secondary | ICD-10-CM | POA: Diagnosis not present

## 2022-07-19 DIAGNOSIS — Z23 Encounter for immunization: Secondary | ICD-10-CM | POA: Diagnosis not present

## 2022-07-19 DIAGNOSIS — E119 Type 2 diabetes mellitus without complications: Secondary | ICD-10-CM

## 2022-07-19 DIAGNOSIS — Z13820 Encounter for screening for osteoporosis: Secondary | ICD-10-CM

## 2022-07-19 DIAGNOSIS — E1169 Type 2 diabetes mellitus with other specified complication: Secondary | ICD-10-CM

## 2022-07-19 DIAGNOSIS — E669 Obesity, unspecified: Secondary | ICD-10-CM

## 2022-07-19 LAB — POCT GLYCOSYLATED HEMOGLOBIN (HGB A1C): HbA1c, POC (controlled diabetic range): 6 % (ref 0.0–7.0)

## 2022-07-19 NOTE — Patient Instructions (Addendum)
It was great to see you today! Thank you for choosing [facility] for your primary care. Kristin [name] was seen for follow up.  Today we addressed: Continue with Metformin and bp medications  [facility] about your D[facility]A [facility]   If you would like the Shingles vaccine let me know  You can get the Shingrix vaccine at the pharmacy. You will need a 2nd shot 2-6 months after the first. This vaccine is important to help prevent shingles.    If you haven't already, sign up for My Chart to have easy access to your labs results, and communication with your primary care physician.  We are checking some labs today. If they are abnormal, I will call you. If they are normal, I will send you a MyChart message (if it is active) or a letter in the mail. If you do not hear about your labs in the next 2 weeks, please call the office. I recommend that you always bring your medications to each appointment as this makes it easy to ensure you are on the correct medications and helps [place] not miss refills when you need them. Call the clinic at [phone] if your symptoms worsen or you have any concerns.  You should return to our clinic Return in about 3 months (around 10/18/2022) for DM, HTN, foot exam . Please arrive 15 minutes before your appointment to ensure smooth check in process.  We appreciate your efforts in making this happen.  Thank you for allowing me to participate in your care, Erskine Emery, MD 07/19/2022, 2:40 PM PGY-2, [place]

## 2022-07-19 NOTE — Progress Notes (Signed)
  SUBJECTIVE:   CHIEF COMPLAINT / HPI:   Type 2 Diabetes: Home medications include: Metformin 1000 BID. Does endorse compliance. Home glucose monitoring is not performed.   Most recent A1Cs:  Lab Results  Component Value Date   HGBA1C 6.0 07/19/2022   HGBA1C 6.2 10/18/2021   Last Microalbumin, LDL, Creatinine: Lab Results  Component Value Date   MICROALBUR 30 10/30/2019   LDLCALC 61 07/19/2022   CREATININE 0.96 07/19/2022    Patient is not up to date on diabetic eye. Patient is not up to date on diabetic foot exam.  Hypertension: BP: 133/66 today. Home medications include: HCTZ 25 mg, Metoprolol 25 mg BID. She endorses taking these medications as prescribed. Does not check blood pressure at home. Most recent creatinine trend:  Lab Results  Component Value Date   CREATININE 0.96 07/19/2022   CREATININE 1.04 (H) 10/18/2021   CREATININE 1.10 (H) 12/26/2019   Patient has had a BMP in the past 1 year.   PERTINENT  PMH / PSH: Allergies, history of anemia, anxiety, arthritis, RA, renal sclerosis, HLD, HTN, IBS, depression, epilepsy   OBJECTIVE:  BP 133/66   Pulse (!) 102   Ht 5' 3.5" (1.613 m)   Wt 267 lb 3.2 oz (121.2 kg)   SpO2 100%   BMI 46.59 kg/m  Physical Exam  General: Alert and oriented in no apparent distress; very pleasant in wheelchair, well appearing and non toxic Heart: Regular rate and rhythm with no murmurs appreciated Lungs: CTA bilaterally, no wheezing Abdomen: Bowel sounds present, no abdominal pain Skin: Warm and dry   ASSESSMENT/PLAN:  Need for pneumococcal 20-valent conjugate vaccination -     Pneumococcal conjugate vaccine 20-valent  Type 2 diabetes mellitus without complication, without long-term current use of insulin (HCC) -     POCT glycosylated hemoglobin (Hb A1C) -     Basic metabolic panel -     Lipid panel  Need for immunization against influenza -     Flu Vaccine QUAD High Dose(Fluad)  Screening for osteoporosis -     DG Bone  Density; Future  HYPERTENSION, BENIGN SYSTEMIC Assessment & Plan: Continue current regimen.    Diabetes mellitus type 2 in obese [facility]) Assessment & Plan: well controlled - Last A1c:  Lab Results  Component Value Date   HGBA1C 6.0 07/19/2022   - Medications: Metformin BID  - Compliance: good compliance and well controlled! - Needs foot exam on next check - Provided optometrist lists for the patient     Very slight tachycardia on VS check. On physical, no tachycardia with rate in 90s on manual check. Overall well appearing and non toxic with normal oxygen saturation and regular BP. Recheck HR on next check.   History of recent hip surgery, follows closely with orthopedic surgery post operatively.   Return in about 3 months (around 10/18/2022) for DM, HTN, foot exam . Erskine Emery, MD 07/21/2022, 7:51 AM PGY-2, [place]

## 2022-07-20 LAB — BASIC METABOLIC PANEL
BUN/Creatinine Ratio: 16 (ref 12–28)
BUN: 15 mg/dL (ref 8–27)
CO2: 27 mmol/L (ref 20–29)
Calcium: 10.3 mg/dL (ref 8.7–10.3)
Chloride: 98 mmol/L (ref 96–106)
Creatinine, Ser: 0.96 mg/dL (ref 0.57–1.00)
Glucose: 91 mg/dL (ref 70–99)
Potassium: 3.9 mmol/L (ref 3.5–5.2)
Sodium: 139 mmol/L (ref 134–144)
eGFR: 66 mL/min/{1.73_m2} (ref >59)

## 2022-07-20 LAB — LIPID PANEL
Chol/HDL Ratio: 2.7 ratio (ref 0.0–4.4)
Cholesterol, Total: 131 mg/dL (ref 100–199)
HDL: 49 mg/dL (ref >39)
LDL Chol Calc (NIH): 61 mg/dL (ref 0–99)
Triglycerides: 115 mg/dL (ref 0–149)
VLDL Cholesterol Cal: 21 mg/dL (ref 5–40)

## 2022-07-21 ENCOUNTER — Encounter: Payer: Self-pay | Admitting: Student

## 2022-07-21 NOTE — Assessment & Plan Note (Signed)
" >>  ASSESSMENT AND PLAN FOR HYPERTENSION, BENIGN SYSTEMIC WRITTEN ON 07/21/2022  7:46 AM BY Lavalle Skoda, MD  Continue current regimen.  "

## 2022-07-21 NOTE — Assessment & Plan Note (Signed)
well controlled - Last A1c:  Lab Results  Component Value Date   HGBA1C 6.0 07/19/2022   - Medications: Metformin BID  - Compliance: good compliance and well controlled! - Needs foot exam on next check - Provided optometrist lists for the patient

## 2022-07-21 NOTE — Assessment & Plan Note (Signed)
Continue current regimen

## 2022-07-25 ENCOUNTER — Encounter: Payer: Self-pay | Admitting: Student

## 2022-07-29 NOTE — Therapy (Unsigned)
OUTPATIENT PHYSICAL THERAPY LOWER EXTREMITY EVALUATION   Patient Name: Kristin [name] MRN: [id number] DOB:02/05/1956, 67 y.o., female Today's Date: 08/01/2022  END OF SESSION:  PT End of Session - 08/01/22 1137     Visit Number 1    Number of Visits 12    Date for PT Re-Evaluation 09/26/22    Authorization Type [employer] MCR    PT Start Time 1000    PT Stop Time 1045    PT Time Calculation (min) 45 min    Activity Tolerance Patient tolerated treatment well;Patient limited by fatigue    Behavior During Therapy WFL for tasks assessed/performed             Past Medical History:  Diagnosis Date   Allergy    seasonal   Anemia    Anxiety    Arthritis    Back pain, chronic    Borderline diabetes    Bronchitis    Constipation    current issue   Cough    Depression    Epilepsy (HCC)    GERD (gastroesophageal reflux disease)    Hyperlipidemia    Hypertension    IBS (irritable bowel syndrome)    Joint pain    Obesity    Palpitations    Pneumonia 2016   Pre-diabetes    Renal sclerosis, unspecified    only has 1 kidney   Rheumatoid arthritis ([place])    Seizures ([place])    last >15 +  yrs ago- recorded 07-26-2021   Somatization disorder    Tobacco abuse    Past Surgical History:  Procedure Laterality Date   ABDOMINAL HYSTERECTOMY     ANTERIOR HIP REVISION Left 07/25/2016   Procedure: LEFT HIP IRRIGATION AND DEBRIDEMENT, REVISION OF HEAD AND LINER;  Surgeon: Leandrew Koyanagi, MD;  Location: [place];  Service: Orthopedics;  Laterality: Left;   APPLICATION OF WOUND VAC Left 12/23/2019   Procedure: APPLICATION OF WOUND VAC;  Surgeon: Leandrew Koyanagi, MD;  Location: [place];  Service: Orthopedics;  Laterality: Left;   BREAST EXCISIONAL BIOPSY Right    CHOLECYSTECTOMY     COLONOSCOPY     DEBRIDEMENT AND CLOSURE WOUND Left 07/17/2019   Procedure: Excision of left leg wound with ACell placement and primary closure;  Surgeon: Wallace Going, DO;  Location: [place];   Service: Plastics;  Laterality: Left;  60 min   EXCISIONAL TOTAL HIP ARTHROPLASTY WITH ANTIBIOTIC SPACERS Left 12/23/2019   Procedure: LEFT HIP IRRIGATION AND DEBRIDEMENT LEFT HIP WOUND and poly exchange ;  Surgeon: Leandrew Koyanagi, MD;  Location: [place];  Service: Orthopedics;  Laterality: Left;   HIP DEBRIDEMENT Left 01/19/2018   Procedure(s) Performed: IRRIGATION AND DEBRIDEMENT LEFT HIP (Left )   INCISION AND DRAINAGE HIP Left 08/03/2016   Procedure: IRRIGATION AND DEBRIDEMENT LEFT HIP, VAC PLACEMENT;  Surgeon: Leandrew Koyanagi, MD;  Location: [place];  Service: Orthopedics;  Laterality: Left;   INCISION AND DRAINAGE HIP Left 01/19/2018   Procedure: IRRIGATION AND DEBRIDEMENT LEFT HIP;  Surgeon: Leandrew Koyanagi, MD;  Location: [place];  Service: Orthopedics;  Laterality: Left;   kidney stent Right 2002   TONSILLECTOMY Bilateral 12/06/2014   Procedure: TONSILLECTOMY, BILATERAL;  Surgeon: Ruby Cola, MD;  Location: [place];  Service: ENT;  Laterality: Bilateral;   TONSILLECTOMY     TOTAL HIP ARTHROPLASTY Left 06/01/2016   Procedure: LEFT TOTAL HIP ARTHROPLASTY ANTERIOR APPROACH;  Surgeon: Leandrew Koyanagi, MD;  Location: [place];  Service: Orthopedics;  Laterality: Left;   TUBAL LIGATION     WOUND EXPLORATION Left 01/19/2018   Procedure: WOUND EXPLORATION;  Surgeon: Leandrew Koyanagi, MD;  Location: [place];  Service: Orthopedics;  Laterality: Left;   Patient Active Problem List   Diagnosis Date Noted   Urinary retention 12/13/2021   Postoperative anemia due to acute blood loss 12/10/2021   Presence of left artificial hip joint 12/10/2021   Diabetes mellitus type 2 in obese ([place]) 12/09/2021   Infection and inflammatory reaction due to other internal joint prosthesis, initial encounter ([place]) 12/09/2021   Preop examination 12/09/2021   HTN (hypertension) 12/08/2021   Arthritis 11/26/2021   Seizures ([place]) 11/26/2021   Rash 09/18/2020   Worsening headaches 01/21/2020   Pruritus 10/30/2019   Hypercalcemia 10/24/2019    Body mass index 45.0-49.9, adult ([place]) 08/27/2019   Intertrigo 07/09/2019   Facial rash 07/27/2018   Body mass index 40.0-44.9, adult ([place]) 02/15/2018   Fatigue associated with anemia 02/07/2018   Unspecified open wound, left hip, initial encounter 01/19/2018   Wound of left leg, sequela 10/03/2016   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[place]) 08/24/2016   Status post left hip replacement 06/01/2016   Sinus tachycardia 05/19/2016   History of adenomatous polyp of colon 03/21/2016   Diabetes ([place]) 01/21/2016   Thyroid nodule 12/25/2014   Infiltrate noted on imaging study    Cervical spine arthritis 11/14/2014   Primary osteoarthritis of left hip 11/14/2014   Loss of consciousness ([place]) 11/14/2014   Chest pain 03/26/2014   Musculoskeletal chest and rib pain 03/26/2014   Rib pain on right side 02/28/2014   Groin pain 04/18/2013   UNSPECIFIED RENAL SCLEROSIS 01/16/2009   GERD, SEVERE 05/25/2007   Morbid obesity ([place]) 04/03/2007   Dyslipidemia 08/31/2006   Major depression, recurrent ([place]) 08/31/2006   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   Former smoker 08/31/2006   HYPERTENSION, BENIGN SYSTEMIC 08/31/2006   GASTRIC ULCER ACUTE WITHOUT HEMORRHAGE 08/31/2006   CONVULSIONS, SEIZURES, NOS 08/31/2006    PCP: Erskine Emery, MD   REFERRING PROVIDER: Elsie Ra, MD  REFERRING DIAG: [phone] (ICD-10-CM) - Presence of left artificial hip joint  THERAPY DIAG: L THA revision   Rationale for Evaluation and Treatment: Rehabilitation  ONSET DATE: 2 months, 05/11/22 surgery date  SUBJECTIVE:   SUBJECTIVE STATEMENT: L Total hip revision   PERTINENT HISTORY: Patient ID: Kristin [name] is a 67 y.o. female.  Chief Complaint: Follow-up of the Left Hip  Months out from reimplantation over left total hip for chronic periprosthetic joint infection. Otherwise she is doing well. She rates her pain as a 3/10. She has been partial weight-bearing. She has had some accumulation of her  seroma since removal of her drains. She has been off all of her antibiotics. Cultures were negative at the time of surgery  PAIN:  Are you having pain? Yes: NPRS scale: 3-7/10 Pain location: L hip Pain description: ache Aggravating factors: walking Relieving factors: rest and meds  PRECAUTIONS: Anterior hip  WEIGHT BEARING RESTRICTIONS: Yes WBAT  posterior hip precautions  FALLS:  Has patient fallen in last 6 months? No   OCCUPATION: not working  PLOF: Independent  PATIENT GOALS: To return to walking  NEXT MD VISIT:  4 months  OBJECTIVE:   DIAGNOSTIC FINDINGS: none noted  PATIENT SURVEYS:  FOTO 47(51 predicted)  COGNITION: Overall cognitive status: Within functional limits for tasks assessed     SENSATION: Not tested  MUSCLE LENGTH: Hamstrings: Right 80 deg; Left 80 deg  POSTURE:  not assessed  PALPATION: deferred  LOWER EXTREMITY ROM:  Active ROM Right eval Left eval  Hip flexion 110 85  Hip extension    Hip abduction    Hip adduction    Hip internal rotation    Hip external rotation    Knee flexion [facility] WFL  Knee extension [facility]  Ankle dorsiflexion    Ankle plantarflexion    Ankle inversion    Ankle eversion     (Blank rows = not tested)  LOWER EXTREMITY MMT:  MMT Right eval Left eval  Hip flexion  4-  Hip extension  4-  Hip abduction  4-  Hip adduction    Hip internal rotation    Hip external rotation    Knee flexion  4-  Knee extension  4-  Ankle dorsiflexion    Ankle plantarflexion  4-  Ankle inversion    Ankle eversion     (Blank rows = not tested)  LOWER EXTREMITY SPECIAL TESTS:  Hip special tests: ITB negative   FUNCTIONAL TESTS: 30s chair stand 6 reps arms crossed   GAIT: Distance walked: 36f  Assistive device utilized: WEnvironmental consultant- 2 wheeled Level of assistance: SBA Comments: slow cadence   TODAY'S TREATMENT:                                                                                                                               DATE: 08/01/22 Eval and HEP    PATIENT EDUCATION:  Education details: Discussed eval findings, rehab rationale and POC and patient is in agreement  Person educated: Patient Education method: Explanation Education comprehension: verbalized understanding and needs further education  HOME EXERCISE PROGRAM: Access Code: [email or web address]: [email or web address] Date: 08/01/2022 Prepared by: JSharlynn Oliphant Exercises - Sit to Stand Without Arm Support  - 2 x daily - 5 x weekly - 1 sets - 5 reps - Supine Bridge  - 2 x daily - 5 x weekly - 1 sets - 10 reps - Standing Heel Raise with Support  - 2 x daily - 5 x weekly - 1 sets - 10 reps  ASSESSMENT:  CLINICAL IMPRESSION: Patient is a 67y.o. female who was seen today for physical therapy evaluation and treatment for L hip pain, weakness and decreased functional mobility following L THA revision.  Patient underwent final revision on 137/6/2831due to complications from original surgery.  She has had a brief inpatient stay as well as a short course of home health physical therapy.  She is currently weightbearing as tolerated.  Patient presents with decreased hip flexion range of motion and is on posterior hip precautions.  Mild strength deficits were noted throughout the left lower extremity. Patient is able to transfer independently and can perform sit to stand task without upper extremity support.  Gait distance is limited to less than 50 feet using rolling walker due to complaints of fatigue.  Patient is a good candidate for outpatient physical therapy  which will incorporate aquatic therapy initially to work on endurance and weightbearing tasks.  OBJECTIVE IMPAIRMENTS: Abnormal gait, decreased activity tolerance, decreased balance, decreased endurance, decreased mobility, difficulty walking, decreased ROM, decreased strength, impaired perceived functional ability, and pain.   ACTIVITY LIMITATIONS: carrying, lifting,  sitting, standing, squatting, sleeping, and stairs  PERSONAL FACTORS: Fitness, Past/current experiences, Time since onset of injury/illness/exacerbation, and 1 comorbidity: DM  are also affecting patient's functional outcome.   REHAB POTENTIAL: Good  CLINICAL DECISION MAKING: Evolving/moderate complexity  EVALUATION COMPLEXITY: Low   GOALS: Goals reviewed with patient? Yes  SHORT TERM GOALS: Target date: 08/22/2022   Patient to demonstrate independence in HEP Baseline:[id number] Goal status: INITIAL  2.  267f ambulation with RW and SBA Baseline: 433fwith RW and SBA Goal status: INITIAL   LONG TERM GOALS: Target date: 09/12/2022    Increase FOTO score to 51 Baseline: 47 Goal status: INITIAL  2.  Decrease worst pain to 3/10 Baseline: 7/10 Goal status: INITIAL  3.  Increase RLE strength to 4/5 Baseline:  MMT Right eval Left eval  Hip flexion  4-  Hip extension  4-  Hip abduction  4-  Hip adduction    Hip internal rotation    Hip external rotation    Knee flexion  4-  Knee extension  4-  Ankle dorsiflexion    Ankle plantarflexion  4-   Goal status: INITIAL  4.  Patient to ambulate 200 ft with LRAD Baseline: 4430fith RW Goal status: INITIAL     PLAN:  PT FREQUENCY: 2x/week  PT DURATION: 6 weeks  PLANNED INTERVENTIONS: Therapeutic exercises, Therapeutic activity, Neuromuscular re-education, Balance training, Gait training, Patient/Family education, Self Care, Joint mobilization, Stair training, DME instructions, Aquatic Therapy, and Re-evaluation  PLAN FOR NEXT SESSION: HEP review and update, aerobic tasks, LE strengthening, gait training, balance/proprioceptive work   JefLanice ShirtsT 08/01/2022, 11:39 AM

## 2022-08-01 ENCOUNTER — Ambulatory Visit: Payer: 59 | Attending: Orthopaedic Surgery

## 2022-08-01 ENCOUNTER — Other Ambulatory Visit: Payer: Self-pay

## 2022-08-01 DIAGNOSIS — Z96649 Presence of unspecified artificial hip joint: Secondary | ICD-10-CM | POA: Diagnosis not present

## 2022-08-01 DIAGNOSIS — M6281 Muscle weakness (generalized): Secondary | ICD-10-CM

## 2022-08-01 DIAGNOSIS — M25552 Pain in left hip: Secondary | ICD-10-CM | POA: Diagnosis present

## 2022-08-01 NOTE — Patient Instructions (Signed)
Aquatic Therapy at [facility]-  What to Expect! ? ?Where:  ? ?[place] [facility] ?[address] [facility] [address] ?[place], [place] [address] ?Rehab phone [phone] ? ?NOTE:  You will receive an automated phone message reminding you of your appt and it will say the appointment is at the [address] [facility] [address] [facility] clinic. ? ?        ?How to Prepare: ?Please make sure you drink 8 ounces of water about one hour prior to your pool session ?A caregiver may attend if needed with the patient to help assist as needed. A caregiver can sit in the pool room on chair. ?Please arrive IN YOUR SUIT and 15 minutes prior to your appointment - this helps to avoid delays in starting your session. ?Please make sure to attend to any toileting needs prior to entering the pool ?Locker rooms for changing are provided.   There is direct access to the pool deck form the locker room.  You can lock your belongings in a locker with lock provided. ?Once on the pool deck your therapist will ask if you have signed the Patient  Consent and Assignment of Benefits form before beginning treatment ?Your therapist may take your blood pressure prior to, during and after your session if indicated ?We usually try and create a home exercise program based on activities we do in the pool.  Please be thinking about who might be able to assist you in the pool should you need to participate in an aquatic home exercise program at the time of discharge if you need assistance.  Some patients do not want to or do not have the ability to participate in an aquatic home program - this is not a barrier in any way to you participating in aquatic therapy as part of your current therapy plan! ?After Discharge from PT, you can continue using home program at  the [facility]/, there is a drop-in fee for $5 ($45 a month)or for 60 years  or older $4.00 ($40 a month for seniors ) or any local [employer] pool.  Memberships for purchase are  available for gym/pool at [facility] ? ?IT IS VERY IMPORTANT THAT YOUR LAST VISIT BE IN THE [address]INIC AT [address] AFTER YOUR LAST AQUATIC VISIT.  PLEASE MAKE SURE THAT YOU HAVE A [address]  APPOINTMENT SCHEDULED. ? ? ?About the pool: ?Pool is located approximately 500 FT from the entrance of the building.  ?Please bring a support person if you need assistance traveling this      distance.  ? ?Your therapist will assist you in entering the water; there are two ways to     ?      enter: stairs with railings, and a mechanical lift. Your therapist will determine the most appropriate way for you. ? ?Water temperature is usually between 88-90 degrees ? ?There may be up to 2 other swimmers in the pool at the same time ? ?The pool deck is tile, please wear shoes with good traction if you prefer not to be barefoot.  ? ? ?Contact Info:  ?For appointment scheduling and cancellations:         Please call the [facility]  PH:[phone]              Aquatic Therapy  ?[facility]       All sessions are 45 minutes ? ? ? ? ?        ? ?                ?                      ?

## 2022-08-03 ENCOUNTER — Encounter: Payer: Self-pay | Admitting: Physical Therapy

## 2022-08-03 ENCOUNTER — Ambulatory Visit: Payer: 59 | Admitting: Physical Therapy

## 2022-08-03 DIAGNOSIS — Z96649 Presence of unspecified artificial hip joint: Secondary | ICD-10-CM | POA: Diagnosis not present

## 2022-08-03 DIAGNOSIS — M25552 Pain in left hip: Secondary | ICD-10-CM

## 2022-08-03 DIAGNOSIS — M6281 Muscle weakness (generalized): Secondary | ICD-10-CM

## 2022-08-03 NOTE — Therapy (Signed)
OUTPATIENT PHYSICAL THERAPY TREATMENT NOTE   Patient Name: Kristin [name] MRN: [id number] DOB:[birth year], 67 y.o., female Today's Date: 08/03/2022  PCP: Erskine Emery, MD    REFERRING PROVIDER: Elsie Ra, MD  END OF SESSION:   PT End of Session - 08/03/22 1451     Visit Number 2    Number of Visits 12    Date for PT Re-Evaluation 09/26/22    Authorization Type UHC MCR    PT Start Time 5170    PT Stop Time 0325    PT Time Calculation (min) 38 min             Past Medical History:  Diagnosis Date   Allergy    seasonal   Anemia    Anxiety    Arthritis    Back pain, chronic    Borderline diabetes    Bronchitis    Constipation    current issue   Cough    Depression    Epilepsy ([place])    GERD (gastroesophageal reflux disease)    Hyperlipidemia    Hypertension    IBS (irritable bowel syndrome)    Joint pain    Obesity    Palpitations    Pneumonia 2016   Pre-diabetes    Renal sclerosis, unspecified    only has 1 kidney   Rheumatoid arthritis ([place])    Seizures ([place])    last >15 +  yrs ago- recorded 07-26-2021   Somatization disorder    Tobacco abuse    Past Surgical History:  Procedure Laterality Date   ABDOMINAL HYSTERECTOMY     ANTERIOR HIP REVISION Left 07/25/2016   Procedure: LEFT HIP IRRIGATION AND DEBRIDEMENT, REVISION OF HEAD AND LINER;  Surgeon: Leandrew Koyanagi, MD;  Location: [place];  Service: Orthopedics;  Laterality: Left;   APPLICATION OF WOUND VAC Left 12/23/2019   Procedure: APPLICATION OF WOUND VAC;  Surgeon: Leandrew Koyanagi, MD;  Location: [place];  Service: Orthopedics;  Laterality: Left;   BREAST EXCISIONAL BIOPSY Right    CHOLECYSTECTOMY     COLONOSCOPY     DEBRIDEMENT AND CLOSURE WOUND Left 07/17/2019   Procedure: Excision of left leg wound with ACell placement and primary closure;  Surgeon: Wallace Going, DO;  Location: [place];  Service: Plastics;  Laterality: Left;  60 min   EXCISIONAL TOTAL HIP ARTHROPLASTY  WITH ANTIBIOTIC SPACERS Left 12/23/2019   Procedure: LEFT HIP IRRIGATION AND DEBRIDEMENT LEFT HIP WOUND and poly exchange ;  Surgeon: Leandrew Koyanagi, MD;  Location: [place];  Service: Orthopedics;  Laterality: Left;   HIP DEBRIDEMENT Left 01/19/2018   Procedure(s) Performed: IRRIGATION AND DEBRIDEMENT LEFT HIP (Left )   INCISION AND DRAINAGE HIP Left 08/03/2016   Procedure: IRRIGATION AND DEBRIDEMENT LEFT HIP, VAC PLACEMENT;  Surgeon: Leandrew Koyanagi, MD;  Location: [place];  Service: Orthopedics;  Laterality: Left;   INCISION AND DRAINAGE HIP Left 01/19/2018   Procedure: IRRIGATION AND DEBRIDEMENT LEFT HIP;  Surgeon: Leandrew Koyanagi, MD;  Location: [place];  Service: Orthopedics;  Laterality: Left;   kidney stent Right 2002   TONSILLECTOMY Bilateral 12/06/2014   Procedure: TONSILLECTOMY, BILATERAL;  Surgeon: Ruby Cola, MD;  Location: [place];  Service: ENT;  Laterality: Bilateral;   TONSILLECTOMY     TOTAL HIP ARTHROPLASTY Left 06/01/2016   Procedure: LEFT TOTAL HIP ARTHROPLASTY ANTERIOR APPROACH;  Surgeon: Leandrew Koyanagi, MD;  Location: [place];  Service: Orthopedics;  Laterality: Left;   TUBAL LIGATION  WOUND EXPLORATION Left 01/19/2018   Procedure: WOUND EXPLORATION;  Surgeon: Leandrew Koyanagi, MD;  Location: [place];  Service: Orthopedics;  Laterality: Left;   Patient Active Problem List   Diagnosis Date Noted   Urinary retention 12/13/2021   Postoperative anemia due to acute blood loss 12/10/2021   Presence of left artificial hip joint 12/10/2021   Diabetes mellitus type 2 in obese ([place]) 12/09/2021   Infection and inflammatory reaction due to other internal joint prosthesis, initial encounter ([place]) 12/09/2021   Preop examination 12/09/2021   HTN (hypertension) 12/08/2021   Arthritis 11/26/2021   Seizures ([place]) 11/26/2021   Rash 09/18/2020   Worsening headaches 01/21/2020   Pruritus 10/30/2019   Hypercalcemia 10/24/2019   Body mass index 45.0-49.9, adult ([place]) 08/27/2019   Intertrigo 07/09/2019    Facial rash 07/27/2018   Body mass index 40.0-44.9, adult ([place]) 02/15/2018   Fatigue associated with anemia 02/07/2018   Unspecified open wound, left hip, initial encounter 01/19/2018   Wound of left leg, sequela 10/03/2016   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[place]) 08/24/2016   Status post left hip replacement 06/01/2016   Sinus tachycardia 05/19/2016   History of adenomatous polyp of colon 03/21/2016   Diabetes ([place]) 01/21/2016   Thyroid nodule 12/25/2014   Infiltrate noted on imaging study    Cervical spine arthritis 11/14/2014   Primary osteoarthritis of left hip 11/14/2014   Loss of consciousness ([place]) 11/14/2014   Chest pain 03/26/2014   Musculoskeletal chest and rib pain 03/26/2014   Rib pain on right side 02/28/2014   Groin pain 04/18/2013   UNSPECIFIED RENAL SCLEROSIS 01/16/2009   GERD, SEVERE 05/25/2007   Morbid obesity ([place]) 04/03/2007   Dyslipidemia 08/31/2006   Major depression, recurrent ([place]) 08/31/2006   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   Former smoker 08/31/2006   HYPERTENSION, BENIGN SYSTEMIC 08/31/2006   GASTRIC ULCER ACUTE WITHOUT HEMORRHAGE 08/31/2006   CONVULSIONS, SEIZURES, NOS 08/31/2006    REFERRING DIAG: G28.366 (ICD-10-CM) - Presence of left artificial hip joint  THERAPY DIAG:  History of revision of total hip arthroplasty  Pain in left hip  Muscle weakness (generalized)  Rationale for Evaluation and Treatment Rehabilitation  PERTINENT HISTORY: Patient ID: Kristin [name] is a 67 y.o. female.  Chief Complaint: Follow-up of the Left Hip  Months out from reimplantation over left total hip for chronic periprosthetic joint infection. Otherwise she is doing well. She rates her pain as a 3/10. She has been partial weight-bearing. She has had some accumulation of her seroma since removal of her drains. She has been off all of her antibiotics. Cultures were negative at the time of surgery   PRECAUTIONS: Anterior hip   WEIGHT BEARING  RESTRICTIONS: Yes WBAT  posterior hip precautions    SUBJECTIVE:  SUBJECTIVE STATEMENT:  My hips are tight, but I am doing good.    PAIN:  Are you having pain? Yes: NPRS scale: 3-7/10 Pain location: L hip Pain description: ache Aggravating factors: walking Relieving factors: rest and meds   OBJECTIVE: (objective measures completed at initial evaluation unless otherwise dated)   DIAGNOSTIC FINDINGS: none noted   PATIENT SURVEYS:  FOTO 47(51 predicted)   COGNITION: Overall cognitive status: Within functional limits for tasks assessed                         SENSATION: Not tested   MUSCLE LENGTH: Hamstrings: Right 80 deg; Left 80 deg   POSTURE:  not assessed   PALPATION: deferred   LOWER EXTREMITY ROM:   Active ROM Right eval Left eval  Hip flexion 110 85  Hip extension      Hip abduction      Hip adduction      Hip internal rotation      Hip external rotation      Knee flexion [facility] WFL  Knee extension [facility] WFL  Ankle dorsiflexion      Ankle plantarflexion      Ankle inversion      Ankle eversion       (Blank rows = not tested)   LOWER EXTREMITY MMT:   MMT Right eval Left eval  Hip flexion   4-  Hip extension   4-  Hip abduction   4-  Hip adduction      Hip internal rotation      Hip external rotation      Knee flexion   4-  Knee extension   4-  Ankle dorsiflexion      Ankle plantarflexion   4-  Ankle inversion      Ankle eversion       (Blank rows = not tested)   LOWER EXTREMITY SPECIAL TESTS:  Hip special tests: ITB negative    FUNCTIONAL TESTS: 30s chair stand 6 reps arms crossed     GAIT: Distance walked: 57f  Assistive device utilized: WEnvironmental consultant- 2 wheeled Level of assistance: SBA Comments: slow cadence     TODAY'S TREATMENT:                                                                                                                               [facility] Adult PT Treatment:                                                DATE: 08/03/22 Therapeutic Exercise: Nustep L3 UE/LE x 5 minutes - monitored for hip flexion.  STS 5 x 2 without UE Heel raises 10 x 2  Bridge 10 x 2  Supine marching  Therapeutic Activity: 127 feet x 2 gait training with  RW and verbal cues for sequencing of RW and step length   DATE: 08/01/22 Eval and HEP      PATIENT EDUCATION:  Education details: Discussed eval findings, rehab rationale and POC and patient is in agreement  Person educated: Patient Education method: Explanation Education comprehension: verbalized understanding and needs further education   HOME EXERCISE PROGRAM: Access Code: [email or web address] URL: [email or web address] Date: 08/01/2022 Prepared by: Sharlynn Oliphant   Exercises - Sit to Stand Without Arm Support  - 2 x daily - 5 x weekly - 1 sets - 5 reps - Supine Bridge  - 2 x daily - 5 x weekly - 1 sets - 10 reps - Standing Heel Raise with Support  - 2 x daily - 5 x weekly - 1 sets - 10 reps   ASSESSMENT:   CLINICAL IMPRESSION: Patient is a 67 y.o. female who was seen today for physical therapy treatment for L hip pain, weakness and decreased functional mobility following L THA revision.  She arrives reporting tightness on bilateral thighs. She arrives in [facility] and is I with self propel. Used clinic RW for gait training and pt able to increase and equalize her step length with verbal gait cues. She reports increased tightness with ambulation. Able to ambulate 127 ft x 2 with reports of hip tightness. Began Nustep with caution to avoid excessive hip flexion. Reviewed HEP and she demonstrates independence.  Patient is a good candidate for continued outpatient physical therapy which will incorporate aquatic therapy to work on endurance and weightbearing tasks.   OBJECTIVE IMPAIRMENTS: Abnormal  gait, decreased activity tolerance, decreased balance, decreased endurance, decreased mobility, difficulty walking, decreased ROM, decreased strength, impaired perceived functional ability, and pain.    ACTIVITY LIMITATIONS: carrying, lifting, sitting, standing, squatting, sleeping, and stairs   PERSONAL FACTORS: Fitness, Past/current experiences, Time since onset of injury/illness/exacerbation, and 1 comorbidity: DM  are also affecting patient's functional outcome.    REHAB POTENTIAL: Good   CLINICAL DECISION MAKING: Evolving/moderate complexity   EVALUATION COMPLEXITY: Low     GOALS: Goals reviewed with patient? Yes   SHORT TERM GOALS: Target date: 08/22/2022   Patient to demonstrate independence in HEP Baseline:[id number] Goal status: INITIAL   2.  244f ambulation with RW and SBA Baseline: 468fwith RW and SBA Goal status: INITIAL     LONG TERM GOALS: Target date: 09/12/2022     Increase FOTO score to 51 Baseline: 47 Goal status: INITIAL   2.  Decrease worst pain to 3/10 Baseline: 7/10 Goal status: INITIAL   3.  Increase RLE strength to 4/5 Baseline:  MMT Right eval Left eval  Hip flexion   4-  Hip extension   4-  Hip abduction   4-  Hip adduction      Hip internal rotation      Hip external rotation      Knee flexion   4-  Knee extension   4-  Ankle dorsiflexion      Ankle plantarflexion   4-    Goal status: INITIAL   4.  Patient to ambulate 200 ft with LRAD Baseline: 4442fith RW Goal status: INITIAL         PLAN:   PT FREQUENCY: 2x/week   PT DURATION: 6 weeks   PLANNED INTERVENTIONS: Therapeutic exercises, Therapeutic activity, Neuromuscular re-education, Balance training, Gait training, Patient/Family education, Self Care, Joint mobilization, Stair training, DME instructions, Aquatic Therapy, and Re-evaluation   PLAN FOR NEXT SESSION: HEP review and update, aerobic tasks, LE strengthening,  gait training, balance/proprioceptive work        Hessie Diener, [place] 08/03/22 3:36 PM Phone: [phone] Fax: [phone]

## 2022-08-08 ENCOUNTER — Ambulatory Visit: Payer: 59 | Attending: Orthopaedic Surgery | Admitting: Physical Therapy

## 2022-08-08 DIAGNOSIS — M6281 Muscle weakness (generalized): Secondary | ICD-10-CM | POA: Diagnosis present

## 2022-08-08 DIAGNOSIS — M25552 Pain in left hip: Secondary | ICD-10-CM | POA: Insufficient documentation

## 2022-08-08 DIAGNOSIS — Z96649 Presence of unspecified artificial hip joint: Secondary | ICD-10-CM | POA: Diagnosis present

## 2022-08-08 NOTE — Therapy (Signed)
OUTPATIENT PHYSICAL THERAPY TREATMENT NOTE   Patient Name: Kristin [name] MRN: [id number] DOB:05-10-1956, 67 y.o., female Today's Date: 08/08/2022  PCP: Erskine Emery, MD    REFERRING PROVIDER: Elsie Ra, MD  END OF SESSION:   PT End of Session - 08/08/22 1100     Visit Number 3    Number of Visits 12    Date for PT Re-Evaluation 09/26/22    Authorization Type UHC MCR    PT Start Time 1100    PT Stop Time 3810    PT Time Calculation (min) 45 min             Past Medical History:  Diagnosis Date   Allergy    seasonal   Anemia    Anxiety    Arthritis    Back pain, chronic    Borderline diabetes    Bronchitis    Constipation    current issue   Cough    Depression    Epilepsy ([place])    GERD (gastroesophageal reflux disease)    Hyperlipidemia    Hypertension    IBS (irritable bowel syndrome)    Joint pain    Obesity    Palpitations    Pneumonia 2016   Pre-diabetes    Renal sclerosis, unspecified    only has 1 kidney   Rheumatoid arthritis ([place])    Seizures ([place])    last >15 +  yrs ago- recorded 07-26-2021   Somatization disorder    Tobacco abuse    Past Surgical History:  Procedure Laterality Date   ABDOMINAL HYSTERECTOMY     ANTERIOR HIP REVISION Left 07/25/2016   Procedure: LEFT HIP IRRIGATION AND DEBRIDEMENT, REVISION OF HEAD AND LINER;  Surgeon: Leandrew Koyanagi, MD;  Location: [place];  Service: Orthopedics;  Laterality: Left;   APPLICATION OF WOUND VAC Left 12/23/2019   Procedure: APPLICATION OF WOUND VAC;  Surgeon: Leandrew Koyanagi, MD;  Location: [place];  Service: Orthopedics;  Laterality: Left;   BREAST EXCISIONAL BIOPSY Right    CHOLECYSTECTOMY     COLONOSCOPY     DEBRIDEMENT AND CLOSURE WOUND Left 07/17/2019   Procedure: Excision of left leg wound with ACell placement and primary closure;  Surgeon: Wallace Going, DO;  Location: [place];  Service: Plastics;  Laterality: Left;  60 min   EXCISIONAL TOTAL HIP ARTHROPLASTY  WITH ANTIBIOTIC SPACERS Left 12/23/2019   Procedure: LEFT HIP IRRIGATION AND DEBRIDEMENT LEFT HIP WOUND and poly exchange ;  Surgeon: Leandrew Koyanagi, MD;  Location: [place];  Service: Orthopedics;  Laterality: Left;   HIP DEBRIDEMENT Left 01/19/2018   Procedure(s) Performed: IRRIGATION AND DEBRIDEMENT LEFT HIP (Left )   INCISION AND DRAINAGE HIP Left 08/03/2016   Procedure: IRRIGATION AND DEBRIDEMENT LEFT HIP, VAC PLACEMENT;  Surgeon: Leandrew Koyanagi, MD;  Location: [place];  Service: Orthopedics;  Laterality: Left;   INCISION AND DRAINAGE HIP Left 01/19/2018   Procedure: IRRIGATION AND DEBRIDEMENT LEFT HIP;  Surgeon: Leandrew Koyanagi, MD;  Location: [place];  Service: Orthopedics;  Laterality: Left;   kidney stent Right 2002   TONSILLECTOMY Bilateral 12/06/2014   Procedure: TONSILLECTOMY, BILATERAL;  Surgeon: Ruby Cola, MD;  Location: [place];  Service: ENT;  Laterality: Bilateral;   TONSILLECTOMY     TOTAL HIP ARTHROPLASTY Left 06/01/2016   Procedure: LEFT TOTAL HIP ARTHROPLASTY ANTERIOR APPROACH;  Surgeon: Leandrew Koyanagi, MD;  Location: [place];  Service: Orthopedics;  Laterality: Left;   TUBAL LIGATION  WOUND EXPLORATION Left 01/19/2018   Procedure: WOUND EXPLORATION;  Surgeon: Leandrew Koyanagi, MD;  Location: [place];  Service: Orthopedics;  Laterality: Left;   Patient Active Problem List   Diagnosis Date Noted   Urinary retention 12/13/2021   Postoperative anemia due to acute blood loss 12/10/2021   Presence of left artificial hip joint 12/10/2021   Diabetes mellitus type 2 in obese ([place]) 12/09/2021   Infection and inflammatory reaction due to other internal joint prosthesis, initial encounter ([place]) 12/09/2021   Preop examination 12/09/2021   HTN (hypertension) 12/08/2021   Arthritis 11/26/2021   Seizures ([place]) 11/26/2021   Rash 09/18/2020   Worsening headaches 01/21/2020   Pruritus 10/30/2019   Hypercalcemia 10/24/2019   Body mass index 45.0-49.9, adult ([place]) 08/27/2019   Intertrigo 07/09/2019    Facial rash 07/27/2018   Body mass index 40.0-44.9, adult ([place]) 02/15/2018   Fatigue associated with anemia 02/07/2018   Unspecified open wound, left hip, initial encounter 01/19/2018   Wound of left leg, sequela 10/03/2016   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[place]) 08/24/2016   Status post left hip replacement 06/01/2016   Sinus tachycardia 05/19/2016   History of adenomatous polyp of colon 03/21/2016   Diabetes ([place]) 01/21/2016   Thyroid nodule 12/25/2014   Infiltrate noted on imaging study    Cervical spine arthritis 11/14/2014   Primary osteoarthritis of left hip 11/14/2014   Loss of consciousness ([place]) 11/14/2014   Chest pain 03/26/2014   Musculoskeletal chest and rib pain 03/26/2014   Rib pain on right side 02/28/2014   Groin pain 04/18/2013   UNSPECIFIED RENAL SCLEROSIS 01/16/2009   GERD, SEVERE 05/25/2007   Morbid obesity ([place]) 04/03/2007   Dyslipidemia 08/31/2006   Major depression, recurrent ([place]) 08/31/2006   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   Former smoker 08/31/2006   HYPERTENSION, BENIGN SYSTEMIC 08/31/2006   GASTRIC ULCER ACUTE WITHOUT HEMORRHAGE 08/31/2006   CONVULSIONS, SEIZURES, NOS 08/31/2006    REFERRING DIAG: B51.025 (ICD-10-CM) - Presence of left artificial hip joint  THERAPY DIAG:  History of revision of total hip arthroplasty  Muscle weakness (generalized)  Pain in left hip  Rationale for Evaluation and Treatment Rehabilitation  PERTINENT HISTORY: Patient ID: Kristin [name] is a 66 y.o. female.  Chief Complaint: Follow-up of the Left Hip  Months out from reimplantation over left total hip for chronic periprosthetic joint infection. Otherwise she is doing well. She rates her pain as a 3/10. She has been partial weight-bearing. She has had some accumulation of her seroma since removal of her drains. She has been off all of her antibiotics. Cultures were negative at the time of surgery   PRECAUTIONS: Anterior hip   WEIGHT BEARING  RESTRICTIONS: Yes WBAT  posterior hip precautions    SUBJECTIVE:  SUBJECTIVE STATEMENT:  I felt good after last session. I put ice packs on just in case. I did a lot of walking yesterday, I did not take my wheel chair to a baby shower.    PAIN:  Are you having pain? Yes: NPRS scale: 5/10 left, right hip 2-3/10 Pain location: L hip Pain description: ache Aggravating factors: walking Relieving factors: rest and meds   OBJECTIVE: (objective measures completed at initial evaluation unless otherwise dated)   DIAGNOSTIC FINDINGS: none noted   PATIENT SURVEYS:  FOTO 47(51 predicted)   COGNITION: Overall cognitive status: Within functional limits for tasks assessed                         SENSATION: Not tested   MUSCLE LENGTH: Hamstrings: Right 80 deg; Left 80 deg   POSTURE:  not assessed   PALPATION: deferred   LOWER EXTREMITY ROM:   Active ROM Right eval Left eval  Hip flexion 110 85  Hip extension      Hip abduction      Hip adduction      Hip internal rotation      Hip external rotation      Knee flexion [facility] WFL  Knee extension [facility] WFL  Ankle dorsiflexion      Ankle plantarflexion      Ankle inversion      Ankle eversion       (Blank rows = not tested)   LOWER EXTREMITY MMT:   MMT Right eval Left eval  Hip flexion   4-  Hip extension   4-  Hip abduction   4-  Hip adduction      Hip internal rotation      Hip external rotation      Knee flexion   4-  Knee extension   4-  Ankle dorsiflexion      Ankle plantarflexion   4-  Ankle inversion      Ankle eversion       (Blank rows = not tested)   LOWER EXTREMITY SPECIAL TESTS:  Hip special tests: ITB negative    FUNCTIONAL TESTS: 30s chair stand 6 reps arms crossed     GAIT: Distance walked: 46f  Assistive device  utilized: WEnvironmental consultant- 2 wheeled Level of assistance: SBA Comments: slow cadence     TODAY'S TREATMENT:                                                                                                                              [facility] Adult PT Treatment:                                                DATE: 08/08/22 Therapeutic Exercise: Nustep L5 UE/LE x 5 minutes  Supine Left heel slide 10 x 2  Supine clam green band 10 x 2  Banded bridge 10 x 2  Supine March green band x 10 Ball squeeze 10 x 2  STS x 10, without UE Heel raises 10 x 2  Narrow stance with head turns   AIREX with head turn and nods, EO, EC AIREX narrow stance   Therapeutic Activity: Gait 200 feet with RW    [facility] Adult PT Treatment:                                                DATE: 08/03/22 Therapeutic Exercise: Nustep L3 UE/LE x 5 minutes - monitored for hip flexion.  STS 5 x 2 without UE Heel raises 10 x 2  Bridge 10 x 2  Supine marching  Therapeutic Activity: 127 feet x 2 gait training with RW and verbal cues for sequencing of RW and step length   DATE: 08/01/22 Eval and HEP      PATIENT EDUCATION:  Education details: Discussed eval findings, rehab rationale and POC and patient is in agreement  Person educated: Patient Education method: Explanation Education comprehension: verbalized understanding and needs further education   HOME EXERCISE PROGRAM: Access Code: [email or web address] URL: [email or web address] Date: 08/01/2022 Prepared by: Sharlynn Oliphant   Exercises - Sit to Stand Without Arm Support  - 2 x daily - 5 x weekly - 1 sets - 5 reps - Supine Bridge  - 2 x daily - 5 x weekly - 1 sets - 10 reps - Standing Heel Raise with Support  - 2 x daily - 5 x weekly - 1 sets - 10 reps   ASSESSMENT:   CLINICAL IMPRESSION: Patient is a 67 y.o. female who was seen today for physical therapy treatment for L hip pain, weakness and decreased functional mobility following L THA revision.  She arrives  with RW and reports increased hip pain after walking a lot yesterday. She did not take her WC to a baby shower and walked around a large event center with her RW. She demonstrates improved step length and decreased antalgic pattern today. She ambulated 200 feet in clinic with SBA, meeting STG#2. She does have pain in left low back into buttock with ambulation.  Continued with LE strengthening and began static balance with good tolerance.  Patient is a good candidate for continued outpatient physical therapy which will incorporate aquatic therapy to work on endurance and weightbearing tasks.   OBJECTIVE IMPAIRMENTS: Abnormal gait, decreased activity tolerance, decreased balance, decreased endurance, decreased mobility, difficulty walking, decreased ROM, decreased strength, impaired perceived functional ability, and pain.    ACTIVITY LIMITATIONS: carrying, lifting, sitting, standing, squatting, sleeping, and stairs   PERSONAL FACTORS: Fitness, Past/current experiences, Time since onset of injury/illness/exacerbation, and 1 comorbidity: DM  are also affecting patient's functional outcome.    REHAB POTENTIAL: Good   CLINICAL DECISION MAKING: Evolving/moderate complexity   EVALUATION COMPLEXITY: Low     GOALS: Goals reviewed with patient? Yes   SHORT TERM GOALS: Target date: 08/22/2022   Patient to demonstrate independence in HEP Baseline:[id number] Goal status: INITIAL   2.  221f ambulation with RW and SBA Baseline: 475fwith RW and SBA Goal status: MET     LONG TERM GOALS: Target date: 09/12/2022     Increase FOTO score to 51 Baseline: 47 Goal status: INITIAL   2.  Decrease worst pain to 3/10 Baseline: 7/10  Goal status: INITIAL   3.  Increase RLE strength to 4/5 Baseline:  MMT Right eval Left eval  Hip flexion   4-  Hip extension   4-  Hip abduction   4-  Hip adduction      Hip internal rotation      Hip external rotation      Knee flexion   4-  Knee extension   4-   Ankle dorsiflexion      Ankle plantarflexion   4-    Goal status: INITIAL   4.  Patient to ambulate 200 ft with LRAD Baseline: 32f with RW Goal status: INITIAL         PLAN:   PT FREQUENCY: 2x/week   PT DURATION: 6 weeks   PLANNED INTERVENTIONS: Therapeutic exercises, Therapeutic activity, Neuromuscular re-education, Balance training, Gait training, Patient/Family education, Self Care, Joint mobilization, Stair training, DME instructions, Aquatic Therapy, and Re-evaluation   PLAN FOR NEXT SESSION: HEP review and update, aerobic tasks, LE strengthening, gait training, balance/proprioceptive work       JHessie Diener PTA 08/08/22 12:35 PM Phone: [phone]: [phone]

## 2022-08-10 ENCOUNTER — Ambulatory Visit: Payer: 59

## 2022-08-10 ENCOUNTER — Ambulatory Visit: Payer: 59 | Admitting: Physical Therapy

## 2022-08-10 DIAGNOSIS — M25552 Pain in left hip: Secondary | ICD-10-CM

## 2022-08-10 DIAGNOSIS — Z96649 Presence of unspecified artificial hip joint: Secondary | ICD-10-CM | POA: Diagnosis not present

## 2022-08-10 DIAGNOSIS — M6281 Muscle weakness (generalized): Secondary | ICD-10-CM

## 2022-08-10 NOTE — Therapy (Signed)
OUTPATIENT PHYSICAL THERAPY TREATMENT NOTE   Patient Name: Kristin [name] MRN: [id number] DOB:12-24-1955, 67 y.o., female Today's Date: 08/10/2022  PCP: Erskine Emery, MD    REFERRING PROVIDER: Elsie Ra, MD  END OF SESSION:   PT End of Session - 08/10/22 1132     Visit Number 4    Number of Visits 12    Date for PT Re-Evaluation 09/26/22    Authorization Type [employer] MCR    PT Start Time 1130    PT Stop Time 1210    PT Time Calculation (min) 40 min    Activity Tolerance Patient tolerated treatment well;Patient limited by fatigue    Behavior During Therapy WFL for tasks assessed/performed              Past Medical History:  Diagnosis Date   Allergy    seasonal   Anemia    Anxiety    Arthritis    Back pain, chronic    Borderline diabetes    Bronchitis    Constipation    current issue   Cough    Depression    Epilepsy ([place])    GERD (gastroesophageal reflux disease)    Hyperlipidemia    Hypertension    IBS (irritable bowel syndrome)    Joint pain    Obesity    Palpitations    Pneumonia 2016   Pre-diabetes    Renal sclerosis, unspecified    only has 1 kidney   Rheumatoid arthritis ([place])    Seizures ([place])    last >15 +  yrs ago- recorded 07-26-2021   Somatization disorder    Tobacco abuse    Past Surgical History:  Procedure Laterality Date   ABDOMINAL HYSTERECTOMY     ANTERIOR HIP REVISION Left 07/25/2016   Procedure: LEFT HIP IRRIGATION AND DEBRIDEMENT, REVISION OF HEAD AND LINER;  Surgeon: Leandrew Koyanagi, MD;  Location: [facility];  Service: Orthopedics;  Laterality: Left;   APPLICATION OF WOUND VAC Left 12/23/2019   Procedure: APPLICATION OF WOUND VAC;  Surgeon: Leandrew Koyanagi, MD;  Location: [place];  Service: Orthopedics;  Laterality: Left;   BREAST EXCISIONAL BIOPSY Right    CHOLECYSTECTOMY     COLONOSCOPY     DEBRIDEMENT AND CLOSURE WOUND Left 07/17/2019   Procedure: Excision of left leg wound with ACell placement and primary closure;  Surgeon:  Wallace Going, DO;  Location: [place];  Service: Plastics;  Laterality: Left;  60 min   EXCISIONAL TOTAL HIP ARTHROPLASTY WITH ANTIBIOTIC SPACERS Left 12/23/2019   Procedure: LEFT HIP IRRIGATION AND DEBRIDEMENT LEFT HIP WOUND and poly exchange ;  Surgeon: Leandrew Koyanagi, MD;  Location: [place];  Service: Orthopedics;  Laterality: Left;   HIP DEBRIDEMENT Left 01/19/2018   Procedure(s) Performed: IRRIGATION AND DEBRIDEMENT LEFT HIP (Left )   INCISION AND DRAINAGE HIP Left 08/03/2016   Procedure: IRRIGATION AND DEBRIDEMENT LEFT HIP, VAC PLACEMENT;  Surgeon: Leandrew Koyanagi, MD;  Location: [place];  Service: Orthopedics;  Laterality: Left;   INCISION AND DRAINAGE HIP Left 01/19/2018   Procedure: IRRIGATION AND DEBRIDEMENT LEFT HIP;  Surgeon: Leandrew Koyanagi, MD;  Location: [place];  Service: Orthopedics;  Laterality: Left;   kidney stent Right 2002   TONSILLECTOMY Bilateral 12/06/2014   Procedure: TONSILLECTOMY, BILATERAL;  Surgeon: Ruby Cola, MD;  Location: [facility];  Service: ENT;  Laterality: Bilateral;   TONSILLECTOMY     TOTAL HIP ARTHROPLASTY Left 06/01/2016   Procedure: LEFT TOTAL HIP ARTHROPLASTY  ANTERIOR APPROACH;  Surgeon: Leandrew Koyanagi, MD;  Location: [place];  Service: Orthopedics;  Laterality: Left;   TUBAL LIGATION     WOUND EXPLORATION Left 01/19/2018   Procedure: WOUND EXPLORATION;  Surgeon: Leandrew Koyanagi, MD;  Location: [place];  Service: Orthopedics;  Laterality: Left;   Patient Active Problem List   Diagnosis Date Noted   Urinary retention 12/13/2021   Postoperative anemia due to acute blood loss 12/10/2021   Presence of left artificial hip joint 12/10/2021   Diabetes mellitus type 2 in obese ([place]) 12/09/2021   Infection and inflammatory reaction due to other internal joint prosthesis, initial encounter ([place]) 12/09/2021   Preop examination 12/09/2021   HTN (hypertension) 12/08/2021   Arthritis 11/26/2021   Seizures ([place]) 11/26/2021   Rash 09/18/2020   Worsening  headaches 01/21/2020   Pruritus 10/30/2019   Hypercalcemia 10/24/2019   Body mass index 45.0-49.9, adult ([place]) 08/27/2019   Intertrigo 07/09/2019   Facial rash 07/27/2018   Body mass index 40.0-44.9, adult ([place]) 02/15/2018   Fatigue associated with anemia 02/07/2018   Unspecified open wound, left hip, initial encounter 01/19/2018   Wound of left leg, sequela 10/03/2016   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[place]) 08/24/2016   Status post left hip replacement 06/01/2016   Sinus tachycardia 05/19/2016   History of adenomatous polyp of colon 03/21/2016   Diabetes ([place]) 01/21/2016   Thyroid nodule 12/25/2014   Infiltrate noted on imaging study    Cervical spine arthritis 11/14/2014   Primary osteoarthritis of left hip 11/14/2014   Loss of consciousness ([place]) 11/14/2014   Chest pain 03/26/2014   Musculoskeletal chest and rib pain 03/26/2014   Rib pain on right side 02/28/2014   Groin pain 04/18/2013   UNSPECIFIED RENAL SCLEROSIS 01/16/2009   GERD, SEVERE 05/25/2007   Morbid obesity ([place]) 04/03/2007   Dyslipidemia 08/31/2006   Major depression, recurrent ([place]) 08/31/2006   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   Former smoker 08/31/2006   HYPERTENSION, BENIGN SYSTEMIC 08/31/2006   GASTRIC ULCER ACUTE WITHOUT HEMORRHAGE 08/31/2006   CONVULSIONS, SEIZURES, NOS 08/31/2006    REFERRING DIAG: [id number] (ICD-10-CM) - Presence of left artificial hip joint  THERAPY DIAG: Presence of left artificial hip joint   Rationale for Evaluation and Treatment Rehabilitation  PERTINENT HISTORY: Patient ID: Kristin [name] is a 67 y.o. female.  Chief Complaint: Follow-up of the Left Hip  Months out from reimplantation over left total hip for chronic periprosthetic joint infection. Otherwise she is doing well. She rates her pain as a 3/10. She has been partial weight-bearing. She has had some accumulation of her seroma since removal of her drains. She has been off all of her antibiotics. Cultures  were negative at the time of surgery   PRECAUTIONS:  posterior hip precautions   WEIGHT BEARING RESTRICTIONS: Yes WBAT      SUBJECTIVE:  SUBJECTIVE STATEMENT:  Reports minimal to no L hip pain, main complaint is stiffness   PAIN:  Are you having pain? Yes: NPRS scale: 5/10 left, right hip 2-3/10 Pain location: L hip Pain description: ache Aggravating factors: walking Relieving factors: rest and meds   OBJECTIVE: (objective measures completed at initial evaluation unless otherwise dated)   DIAGNOSTIC FINDINGS: none noted   PATIENT SURVEYS:  FOTO 47(51 predicted)   COGNITION: Overall cognitive status: Within functional limits for tasks assessed                         SENSATION: Not tested   MUSCLE LENGTH: Hamstrings: Right 80 deg; Left 80 deg   POSTURE:  not assessed   PALPATION: deferred   LOWER EXTREMITY ROM:   Active ROM Right eval Left eval  Hip flexion 110 85  Hip extension      Hip abduction      Hip adduction      Hip internal rotation      Hip external rotation      Knee flexion [facility] WFL  Knee extension [facility] WFL  Ankle dorsiflexion      Ankle plantarflexion      Ankle inversion      Ankle eversion       (Blank rows = not tested)   LOWER EXTREMITY MMT:   MMT Right eval Left eval  Hip flexion   4-  Hip extension   4-  Hip abduction   4-  Hip adduction      Hip internal rotation      Hip external rotation      Knee flexion   4-  Knee extension   4-  Ankle dorsiflexion      Ankle plantarflexion   4-  Ankle inversion      Ankle eversion       (Blank rows = not tested)   LOWER EXTREMITY SPECIAL TESTS:  Hip special tests: ITB negative    FUNCTIONAL TESTS: 30s chair stand 6 reps arms crossed     GAIT: Distance walked: 46f  Assistive device utilized:  WEnvironmental consultant- 2 wheeled Level of assistance: SBA Comments: slow cadence     TODAY'S TREATMENT:       [facility] Adult PT Treatment:                                                DATE: 08/10/22 Therapeutic Exercise: Nustep L4 8 min Bridge with ball 15x2 Supine march alt. 15/15 Supine hip fallouts L RTB 15x2(R against bolster) S/L clams L 15x Supine heel slides over slide board for proprioception 15x L hip flexor stretch over table 30s x2 L SLR 15x FAQs with adduction 15x WS onto 4 in block with OH in walker 10/10 Heel raise against wall 15x  [facility] Adult PT Treatment:                                                DATE: 08/08/22 Therapeutic Exercise: Nustep L5 UE/LE x 5 minutes  Supine Left heel slide 10 x 2  Supine clam green band 10 x 2  Banded bridge 10 x 2  Supine March green band x 10 Ball squeeze 10 x 2  STS x 10, without UE Heel raises 10 x 2  Narrow stance with head turns   AIREX with head turn and nods, EO, EC AIREX narrow stance   Therapeutic Activity: Gait 200 feet with RW    [facility] Adult PT Treatment:                                                DATE: 08/03/22 Therapeutic Exercise: Nustep L3 UE/LE x 5 minutes - monitored for hip flexion.  STS 5 x 2 without UE Heel raises 10 x 2  Bridge 10 x 2  Supine marching  Therapeutic Activity: 127 feet x 2 gait training with RW and verbal cues for sequencing of RW and step length   DATE: 08/01/22 Eval and HEP      PATIENT EDUCATION:  Education details: Discussed eval findings, rehab rationale and POC and patient is in agreement  Person educated: Patient Education method: Explanation Education comprehension: verbalized understanding and needs further education   HOME EXERCISE PROGRAM: Access Code: [email or web address] URL: [email or web address] Date: 08/01/2022 Prepared by: Sharlynn Oliphant    Exercises - Sit to Stand Without Arm Support  - 2 x daily - 5 x weekly - 1 sets - 5 reps - Supine Bridge  - 2 x daily - 5 x weekly - 1 sets - 10 reps - Standing Heel Raise with Support  - 2 x daily - 5 x weekly - 1 sets - 10 reps   ASSESSMENT:   CLINICAL IMPRESSION: Increased strengthening tasks with focus on L hip abduction.  Incorporated balance and proprioceptive tasks with heel slides and WS onto block w/o UE support.  Unable to perform STS w/o UE Support due to fatigue.  Added tasks to promote more upright posture and L hip extension.   OBJECTIVE IMPAIRMENTS: Abnormal gait, decreased activity tolerance, decreased balance, decreased endurance, decreased mobility, difficulty walking, decreased ROM, decreased strength, impaired perceived functional ability, and pain.    ACTIVITY LIMITATIONS: carrying, lifting, sitting, standing, squatting, sleeping, and stairs   PERSONAL FACTORS: Fitness, Past/current experiences, Time since onset of injury/illness/exacerbation, and 1 comorbidity: DM  are also affecting patient's functional outcome.    REHAB POTENTIAL: Good   CLINICAL DECISION MAKING: Evolving/moderate complexity   EVALUATION COMPLEXITY: Low     GOALS: Goals reviewed with patient? Yes   SHORT TERM GOALS: Target date: 08/22/2022   Patient to demonstrate independence in HEP Baseline:[id number] Goal status: INITIAL   2.  229f ambulation with RW and SBA Baseline: 484fwith RW and SBA Goal status: MET     LONG TERM GOALS: Target date: 09/12/2022     Increase FOTO score to 51 Baseline: 47 Goal status: INITIAL   2.  Decrease worst pain to 3/10 Baseline: 7/10 Goal status: INITIAL   3.  Increase RLE strength to  4/5 Baseline:  MMT Right eval Left eval  Hip flexion   4-  Hip extension   4-  Hip abduction   4-  Hip adduction      Hip internal rotation      Hip external rotation      Knee flexion   4-  Knee extension   4-  Ankle dorsiflexion      Ankle plantarflexion    4-    Goal status: INITIAL   4.  Patient to ambulate 200 ft with LRAD Baseline: 17f with RW Goal status: INITIAL         PLAN:   PT FREQUENCY: 2x/week   PT DURATION: 6 weeks   PLANNED INTERVENTIONS: Therapeutic exercises, Therapeutic activity, Neuromuscular re-education, Balance training, Gait training, Patient/Family education, Self Care, Joint mobilization, Stair training, DME instructions, Aquatic Therapy, and Re-evaluation   PLAN FOR NEXT SESSION: HEP review and update, aerobic tasks, LE strengthening, gait training, balance/proprioceptive work       JAuto-Owners InsurancePT 08/10/22 12:15 PM Phone: [phone]: [phone]

## 2022-08-15 ENCOUNTER — Ambulatory Visit: Payer: 59

## 2022-08-15 ENCOUNTER — Other Ambulatory Visit: Payer: Self-pay

## 2022-08-15 DIAGNOSIS — M6281 Muscle weakness (generalized): Secondary | ICD-10-CM

## 2022-08-15 DIAGNOSIS — M25552 Pain in left hip: Secondary | ICD-10-CM

## 2022-08-15 DIAGNOSIS — Z96649 Presence of unspecified artificial hip joint: Secondary | ICD-10-CM | POA: Diagnosis not present

## 2022-08-15 NOTE — Therapy (Signed)
OUTPATIENT PHYSICAL THERAPY TREATMENT NOTE   Patient Name: Kristin [name] MRN: [id number] DOB:05-03-1956, 67 y.o., female Today's Date: 08/15/2022  PCP: Erskine Emery, MD    REFERRING PROVIDER: Elsie Ra, MD  END OF SESSION:   PT End of Session - 08/15/22 0959     Visit Number 5    Number of Visits 12    Date for PT Re-Evaluation 09/26/22    Authorization Type [employer] MCR    PT Start Time 1000    PT Stop Time 1040    PT Time Calculation (min) 40 min    Activity Tolerance Patient tolerated treatment well;Patient limited by fatigue    Behavior During Therapy WFL for tasks assessed/performed              Past Medical History:  Diagnosis Date   Allergy    seasonal   Anemia    Anxiety    Arthritis    Back pain, chronic    Borderline diabetes    Bronchitis    Constipation    current issue   Cough    Depression    Epilepsy ([place])    GERD (gastroesophageal reflux disease)    Hyperlipidemia    Hypertension    IBS (irritable bowel syndrome)    Joint pain    Obesity    Palpitations    Pneumonia 2016   Pre-diabetes    Renal sclerosis, unspecified    only has 1 kidney   Rheumatoid arthritis ([place])    Seizures ([place])    last >15 +  yrs ago- recorded 07-26-2021   Somatization disorder    Tobacco abuse    Past Surgical History:  Procedure Laterality Date   ABDOMINAL HYSTERECTOMY     ANTERIOR HIP REVISION Left 07/25/2016   Procedure: LEFT HIP IRRIGATION AND DEBRIDEMENT, REVISION OF HEAD AND LINER;  Surgeon: Leandrew Koyanagi, MD;  Location: [place];  Service: Orthopedics;  Laterality: Left;   APPLICATION OF WOUND VAC Left 12/23/2019   Procedure: APPLICATION OF WOUND VAC;  Surgeon: Leandrew Koyanagi, MD;  Location: [place];  Service: Orthopedics;  Laterality: Left;   BREAST EXCISIONAL BIOPSY Right    CHOLECYSTECTOMY     COLONOSCOPY     DEBRIDEMENT AND CLOSURE WOUND Left 07/17/2019   Procedure: Excision of left leg wound with ACell placement and primary closure;  Surgeon:  Wallace Going, DO;  Location: [place];  Service: Plastics;  Laterality: Left;  60 min   EXCISIONAL TOTAL HIP ARTHROPLASTY WITH ANTIBIOTIC SPACERS Left 12/23/2019   Procedure: LEFT HIP IRRIGATION AND DEBRIDEMENT LEFT HIP WOUND and poly exchange ;  Surgeon: Leandrew Koyanagi, MD;  Location: [facility];  Service: Orthopedics;  Laterality: Left;   HIP DEBRIDEMENT Left 01/19/2018   Procedure(s) Performed: IRRIGATION AND DEBRIDEMENT LEFT HIP (Left )   INCISION AND DRAINAGE HIP Left 08/03/2016   Procedure: IRRIGATION AND DEBRIDEMENT LEFT HIP, VAC PLACEMENT;  Surgeon: Leandrew Koyanagi, MD;  Location: [place];  Service: Orthopedics;  Laterality: Left;   INCISION AND DRAINAGE HIP Left 01/19/2018   Procedure: IRRIGATION AND DEBRIDEMENT LEFT HIP;  Surgeon: Leandrew Koyanagi, MD;  Location: [facility];  Service: Orthopedics;  Laterality: Left;   kidney stent Right 2002   TONSILLECTOMY Bilateral 12/06/2014   Procedure: TONSILLECTOMY, BILATERAL;  Surgeon: Ruby Cola, MD;  Location: [place];  Service: ENT;  Laterality: Bilateral;   TONSILLECTOMY     TOTAL HIP ARTHROPLASTY Left 06/01/2016   Procedure: LEFT TOTAL HIP ARTHROPLASTY  ANTERIOR APPROACH;  Surgeon: Leandrew Koyanagi, MD;  Location: [place];  Service: Orthopedics;  Laterality: Left;   TUBAL LIGATION     WOUND EXPLORATION Left 01/19/2018   Procedure: WOUND EXPLORATION;  Surgeon: Leandrew Koyanagi, MD;  Location: [place];  Service: Orthopedics;  Laterality: Left;   Patient Active Problem List   Diagnosis Date Noted   Urinary retention 12/13/2021   Postoperative anemia due to acute blood loss 12/10/2021   Presence of left artificial hip joint 12/10/2021   Diabetes mellitus type 2 in obese ([place]) 12/09/2021   Infection and inflammatory reaction due to other internal joint prosthesis, initial encounter ([place]) 12/09/2021   Preop examination 12/09/2021   HTN (hypertension) 12/08/2021   Arthritis 11/26/2021   Seizures ([place]) 11/26/2021   Rash 09/18/2020   Worsening  headaches 01/21/2020   Pruritus 10/30/2019   Hypercalcemia 10/24/2019   Body mass index 45.0-49.9, adult ([place]) 08/27/2019   Intertrigo 07/09/2019   Facial rash 07/27/2018   Body mass index 40.0-44.9, adult ([place]) 02/15/2018   Fatigue associated with anemia 02/07/2018   Unspecified open wound, left hip, initial encounter 01/19/2018   Wound of left leg, sequela 10/03/2016   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[place]) 08/24/2016   Status post left hip replacement 06/01/2016   Sinus tachycardia 05/19/2016   History of adenomatous polyp of colon 03/21/2016   Diabetes ([place]) 01/21/2016   Thyroid nodule 12/25/2014   Infiltrate noted on imaging study    Cervical spine arthritis 11/14/2014   Primary osteoarthritis of left hip 11/14/2014   Loss of consciousness ([place]) 11/14/2014   Chest pain 03/26/2014   Musculoskeletal chest and rib pain 03/26/2014   Rib pain on right side 02/28/2014   Groin pain 04/18/2013   UNSPECIFIED RENAL SCLEROSIS 01/16/2009   GERD, SEVERE 05/25/2007   Morbid obesity ([place]) 04/03/2007   Dyslipidemia 08/31/2006   Major depression, recurrent ([place]) 08/31/2006   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   Former smoker 08/31/2006   HYPERTENSION, BENIGN SYSTEMIC 08/31/2006   GASTRIC ULCER ACUTE WITHOUT HEMORRHAGE 08/31/2006   CONVULSIONS, SEIZURES, NOS 08/31/2006    REFERRING DIAG: [id number] (ICD-10-CM) - Presence of left artificial hip joint  THERAPY DIAG: Presence of left artificial hip joint   Rationale for Evaluation and Treatment Rehabilitation  PERTINENT HISTORY: Patient ID: [name] is a 67 y.o. female.  Chief Complaint: Follow-up of the Left Hip  Months out from reimplantation over left total hip for chronic periprosthetic joint infection. Otherwise she is doing well. She rates her pain as a 3/10. She has been partial weight-bearing. She has had some accumulation of her seroma since removal of her drains. She has been off all of her antibiotics. Cultures  were negative at the time of surgery   PRECAUTIONS:  posterior hip precautions   WEIGHT BEARING RESTRICTIONS: Yes WBAT      SUBJECTIVE:  SUBJECTIVE STATEMENT:  5/10 level of discomfort at start of session   PAIN:  Are you having pain? Yes: NPRS scale: 5/10 left, right hip 2-3/10 Pain location: L hip Pain description: ache Aggravating factors: walking Relieving factors: rest and meds   OBJECTIVE: (objective measures completed at initial evaluation unless otherwise dated)   DIAGNOSTIC FINDINGS: none noted   PATIENT SURVEYS:  FOTO 47(51 predicted)   COGNITION: Overall cognitive status: Within functional limits for tasks assessed                         SENSATION: Not tested   MUSCLE LENGTH: Hamstrings: Right 80 deg; Left 80 deg   POSTURE:  not assessed   PALPATION: deferred   LOWER EXTREMITY ROM:   Active ROM Right eval Left eval  Hip flexion 110 85  Hip extension      Hip abduction      Hip adduction      Hip internal rotation      Hip external rotation      Knee flexion [facility] WFL  Knee extension [facility] WFL  Ankle dorsiflexion      Ankle plantarflexion      Ankle inversion      Ankle eversion       (Blank rows = not tested)   LOWER EXTREMITY MMT:   MMT Right eval Left eval  Hip flexion   4-  Hip extension   4-  Hip abduction   4-  Hip adduction      Hip internal rotation      Hip external rotation      Knee flexion   4-  Knee extension   4-  Ankle dorsiflexion      Ankle plantarflexion   4-  Ankle inversion      Ankle eversion       (Blank rows = not tested)   LOWER EXTREMITY SPECIAL TESTS:  Hip special tests: ITB negative    FUNCTIONAL TESTS: 30s chair stand 6 reps arms crossed     GAIT: Distance walked: 64f  Assistive device utilized: WEnvironmental consultant- 2  wheeled Level of assistance: SBA Comments: slow cadence     TODAY'S TREATMENT:       [facility] Adult PT Treatment:                                                DATE: 08/15/22 Therapeutic Exercise: Nustep L4 8 min Seated hamstring stretch 30s x2 Bridge with ball 15x2  Supine march alt. 15/15 x2 2# Supine hip fallouts L GTB 15x2(R against bolster) S/L clams L GTB 15x2 L hip flexor stretch over table 30s x2 FAQs with adduction 15x2 WS onto 6 in block with OH in walker 15/15 emphasize upright posture.  [facility]                                                [facility]: 08/10/22 Therapeutic Exercise: Nustep L4 8 min Bridge with ball 15x2 Supine march alt. 15/15 Supine hip fallouts L RTB 15x2(R against bolster) S/L clams L 15x Supine heel slides over slide board for proprioception 15x L hip flexor stretch over table 30s x2 L SLR 15x  FAQs with adduction 15x WS onto 4 in block with OH in walker 10/10 Heel raise against wall 15x                                                                                                                         [facility] Adult PT Treatment:                                                DATE: 08/08/22 Therapeutic Exercise: Nustep L5 UE/LE x 5 minutes  Supine Left heel slide 10 x 2  Supine clam green band 10 x 2  Banded bridge 10 x 2  Supine March green band x 10 Ball squeeze 10 x 2  STS x 10, without UE Heel raises 10 x 2  Narrow stance with head turns   AIREX with head turn and nods, EO, EC AIREX narrow stance   Therapeutic Activity: Gait 200 feet with RW    [facility] Adult PT Treatment:                                                DATE: 08/03/22 Therapeutic Exercise: Nustep L3 UE/LE x 5 minutes - monitored for hip flexion.  STS 5 x 2 without UE Heel raises 10 x 2  Bridge 10 x 2  Supine marching  Therapeutic Activity: 127 feet x 2 gait training with RW and verbal cues for sequencing of RW and step length   DATE: 08/01/22 Eval and HEP       PATIENT EDUCATION:  Education details: Discussed eval findings, rehab rationale and POC and patient is in agreement  Person educated: Patient Education method: Explanation Education comprehension: verbalized understanding and needs further education   HOME EXERCISE PROGRAM: Access Code: [email or web address] URL: [email or web address] Date: 08/01/2022 Prepared by: Sharlynn Oliphant   Exercises - Sit to Stand Without Arm Support  - 2 x daily - 5 x weekly - 1 sets - 5 reps - Supine Bridge  - 2 x daily - 5 x weekly - 1 sets - 10 reps - Standing Heel Raise with Support  - 2 x daily - 5 x weekly - 1 sets - 10 reps   ASSESSMENT:   CLINICAL IMPRESSION: Responding well to strength and stretch program, increased difficulty and challenge as noted.  Focus on hip flexor stretching and promoting hip extension strength to maintain gains.  Facilitated improved posture with WB tasks.   OBJECTIVE IMPAIRMENTS: Abnormal gait, decreased activity tolerance, decreased balance, decreased endurance, decreased mobility, difficulty walking, decreased ROM, decreased strength, impaired perceived functional ability, and pain.    ACTIVITY LIMITATIONS: carrying, lifting, sitting, standing, squatting, sleeping, and stairs   PERSONAL FACTORS: Fitness, Past/current experiences, Time since onset of  injury/illness/exacerbation, and 1 comorbidity: DM  are also affecting patient's functional outcome.    REHAB POTENTIAL: Good   CLINICAL DECISION MAKING: Evolving/moderate complexity   EVALUATION COMPLEXITY: Low     GOALS: Goals reviewed with patient? Yes   SHORT TERM GOALS: Target date: 08/22/2022   Patient to demonstrate independence in HEP Baseline:[id number] Goal status: INITIAL   2.  216f ambulation with RW and SBA Baseline: 437fwith RW and SBA Goal status: MET     LONG TERM GOALS: Target date: 09/12/2022     Increase FOTO score to 51 Baseline: 47 Goal status: INITIAL   2.  Decrease worst pain  to 3/10 Baseline: 7/10 Goal status: INITIAL   3.  Increase RLE strength to 4/5 Baseline:  MMT Right eval Left eval  Hip flexion   4-  Hip extension   4-  Hip abduction   4-  Hip adduction      Hip internal rotation      Hip external rotation      Knee flexion   4-  Knee extension   4-  Ankle dorsiflexion      Ankle plantarflexion   4-    Goal status: INITIAL   4.  Patient to ambulate 200 ft with LRAD Baseline: 444fith RW Goal status: INITIAL         PLAN:   PT FREQUENCY: 2x/week   PT DURATION: 6 weeks   PLANNED INTERVENTIONS: Therapeutic exercises, Therapeutic activity, Neuromuscular re-education, Balance training, Gait training, Patient/Family education, Self Care, Joint mobilization, Stair training, DME instructions, Aquatic Therapy, and Re-evaluation   PLAN FOR NEXT SESSION: HEP review and update, aerobic tasks, LE strengthening, gait training, balance/proprioceptive work       [employer] 08/15/22 10:39 AM Phone: [phone]: [phone]

## 2022-08-16 MED ORDER — HYDROCHLOROTHIAZIDE 25 MG PO TABS: 25.0000 mg | ORAL_TABLET | Freq: Every day | ORAL | 2 refills | Status: DC

## 2022-08-16 MED ORDER — SIMVASTATIN 20 MG PO TABS: 20.0000 mg | ORAL_TABLET | Freq: Every day | ORAL | 2 refills | Status: DC

## 2022-08-16 MED ORDER — TRAZODONE HCL 50 MG PO TABS: 25.0000 mg | ORAL_TABLET | Freq: Every evening | ORAL | 0 refills | Status: DC | PRN

## 2022-08-16 MED ORDER — AMITRIPTYLINE HCL 75 MG PO TABS: 150.0000 mg | ORAL_TABLET | Freq: Every day | ORAL | 0 refills | Status: DC

## 2022-08-16 MED ORDER — METFORMIN HCL 500 MG PO TABS: 500.0000 mg | ORAL_TABLET | Freq: Two times a day (BID) | ORAL | 0 refills | Status: DC

## 2022-08-16 NOTE — Telephone Encounter (Signed)
Pharmacy calls nurse line again to check the status of request.

## 2022-08-17 ENCOUNTER — Ambulatory Visit: Payer: 59

## 2022-08-18 ENCOUNTER — Encounter: Payer: Self-pay | Admitting: Physical Therapy

## 2022-08-18 ENCOUNTER — Ambulatory Visit: Payer: 59 | Admitting: Physical Therapy

## 2022-08-18 DIAGNOSIS — M25552 Pain in left hip: Secondary | ICD-10-CM

## 2022-08-18 DIAGNOSIS — Z96649 Presence of unspecified artificial hip joint: Secondary | ICD-10-CM | POA: Diagnosis not present

## 2022-08-18 DIAGNOSIS — M6281 Muscle weakness (generalized): Secondary | ICD-10-CM

## 2022-08-18 NOTE — Therapy (Signed)
OUTPATIENT PHYSICAL THERAPY TREATMENT NOTE   Patient Name: Kristin [name] MRN: [id number] DOB:09/21/1955, 67 y.o., female Today's Date: 08/18/2022  PCP: Kristin Emery, MD    REFERRING PROVIDER: Elsie Ra, MD  END OF SESSION:   PT End of Session - 08/18/22 1443     Visit Number 6    Number of Visits 12    Date for PT Re-Evaluation 09/26/22    Authorization Type [employer] MCR    PT Start Time 0245    PT Stop Time 0326    PT Time Calculation (min) 41 min    Activity Tolerance Patient tolerated treatment well;Patient limited by fatigue    Behavior During Therapy WFL for tasks assessed/performed               Past Medical History:  Diagnosis Date   Allergy    seasonal   Anemia    Anxiety    Arthritis    Back pain, chronic    Borderline diabetes    Bronchitis    Constipation    current issue   Cough    Depression    Epilepsy ([place])    GERD (gastroesophageal reflux disease)    Hyperlipidemia    Hypertension    IBS (irritable bowel syndrome)    Joint pain    Obesity    Palpitations    Pneumonia 2016   Pre-diabetes    Renal sclerosis, unspecified    only has 1 kidney   Rheumatoid arthritis ([place])    Seizures ([place])    last >15 +  yrs ago- recorded 07-26-2021   Somatization disorder    Tobacco abuse    Past Surgical History:  Procedure Laterality Date   ABDOMINAL HYSTERECTOMY     ANTERIOR HIP REVISION Left 07/25/2016   Procedure: LEFT HIP IRRIGATION AND DEBRIDEMENT, REVISION OF HEAD AND LINER;  Surgeon: Kristin Koyanagi, MD;  Location: [place];  Service: Orthopedics;  Laterality: Left;   APPLICATION OF WOUND VAC Left 12/23/2019   Procedure: APPLICATION OF WOUND VAC;  Surgeon: Kristin Koyanagi, MD;  Location: [place];  Service: Orthopedics;  Laterality: Left;   BREAST EXCISIONAL BIOPSY Right    CHOLECYSTECTOMY     COLONOSCOPY     DEBRIDEMENT AND CLOSURE WOUND Left 07/17/2019   Procedure: Excision of left leg wound with ACell placement and primary closure;  Surgeon:  Kristin Going, DO;  Location: [place];  Service: Plastics;  Laterality: Left;  60 min   EXCISIONAL TOTAL HIP ARTHROPLASTY WITH ANTIBIOTIC SPACERS Left 12/23/2019   Procedure: LEFT HIP IRRIGATION AND DEBRIDEMENT LEFT HIP WOUND and poly exchange ;  Surgeon: Kristin Koyanagi, MD;  Location: [place];  Service: Orthopedics;  Laterality: Left;   HIP DEBRIDEMENT Left 01/19/2018   Procedure(s) Performed: IRRIGATION AND DEBRIDEMENT LEFT HIP (Left )   INCISION AND DRAINAGE HIP Left 08/03/2016   Procedure: IRRIGATION AND DEBRIDEMENT LEFT HIP, VAC PLACEMENT;  Surgeon: Kristin Koyanagi, MD;  Location: [place];  Service: Orthopedics;  Laterality: Left;   INCISION AND DRAINAGE HIP Left 01/19/2018   Procedure: IRRIGATION AND DEBRIDEMENT LEFT HIP;  Surgeon: Kristin Koyanagi, MD;  Location: [place];  Service: Orthopedics;  Laterality: Left;   kidney stent Right 2002   TONSILLECTOMY Bilateral 12/06/2014   Procedure: TONSILLECTOMY, BILATERAL;  Surgeon: Kristin Cola, MD;  Location: [place];  Service: ENT;  Laterality: Bilateral;   TONSILLECTOMY     TOTAL HIP ARTHROPLASTY Left 06/01/2016   Procedure: LEFT TOTAL HIP  ARTHROPLASTY ANTERIOR APPROACH;  Surgeon: Kristin Koyanagi, MD;  Location: [place];  Service: Orthopedics;  Laterality: Left;   TUBAL LIGATION     WOUND EXPLORATION Left 01/19/2018   Procedure: WOUND EXPLORATION;  Surgeon: Kristin Koyanagi, MD;  Location: [place];  Service: Orthopedics;  Laterality: Left;   Patient Active Problem List   Diagnosis Date Noted   Urinary retention 12/13/2021   Postoperative anemia due to acute blood loss 12/10/2021   Presence of left artificial hip joint 12/10/2021   Diabetes mellitus type 2 in obese ([place]) 12/09/2021   Infection and inflammatory reaction due to other internal joint prosthesis, initial encounter ([place]) 12/09/2021   Preop examination 12/09/2021   HTN (hypertension) 12/08/2021   Arthritis 11/26/2021   Seizures ([place]) 11/26/2021   Rash 09/18/2020   Worsening  headaches 01/21/2020   Pruritus 10/30/2019   Hypercalcemia 10/24/2019   Body mass index 45.0-49.9, adult ([place]) 08/27/2019   Intertrigo 07/09/2019   Facial rash 07/27/2018   Body mass index 40.0-44.9, adult ([place]) 02/15/2018   Fatigue associated with anemia 02/07/2018   Unspecified open wound, left hip, initial encounter 01/19/2018   Wound of left leg, sequela 10/03/2016   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[place]) 08/24/2016   Status post left hip replacement 06/01/2016   Sinus tachycardia 05/19/2016   History of adenomatous polyp of colon 03/21/2016   Diabetes ([place]) 01/21/2016   Thyroid nodule 12/25/2014   Infiltrate noted on imaging study    Cervical spine arthritis 11/14/2014   Primary osteoarthritis of left hip 11/14/2014   Loss of consciousness ([place]) 11/14/2014   Chest pain 03/26/2014   Musculoskeletal chest and rib pain 03/26/2014   Rib pain on right side 02/28/2014   Groin pain 04/18/2013   UNSPECIFIED RENAL SCLEROSIS 01/16/2009   GERD, SEVERE 05/25/2007   Morbid obesity ([place]) 04/03/2007   Dyslipidemia 08/31/2006   Major depression, recurrent ([place]) 08/31/2006   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   Former smoker 08/31/2006   HYPERTENSION, BENIGN SYSTEMIC 08/31/2006   GASTRIC ULCER ACUTE WITHOUT HEMORRHAGE 08/31/2006   CONVULSIONS, SEIZURES, NOS 08/31/2006    REFERRING DIAG: [id number] (ICD-10-CM) - Presence of left artificial hip joint  THERAPY DIAG: Presence of left artificial hip joint   Rationale for Evaluation and Treatment Rehabilitation  PERTINENT HISTORY: Patient ID: Kristin [name] is a 67 y.o. female.  Chief Complaint: Follow-up of the Left Hip  Months out from reimplantation over left total hip for chronic periprosthetic joint infection. Otherwise she is doing well. She rates her pain as a 3/10. She has been partial weight-bearing. She has had some accumulation of her seroma since removal of her drains. She has been off all of her antibiotics. Cultures  were negative at the time of surgery   PRECAUTIONS:  posterior hip precautions   WEIGHT BEARING RESTRICTIONS: Yes WBAT      SUBJECTIVE:  SUBJECTIVE STATEMENT:    Pt reports that her L hip pain is a 3/10 today.     PAIN:  Are you having pain? Yes: NPRS scale: 5/10 left, right hip 2-3/10 Pain location: L hip Pain description: ache Aggravating factors: walking Relieving factors: rest and meds   OBJECTIVE: (objective measures completed at initial evaluation unless otherwise dated)   DIAGNOSTIC FINDINGS: none noted   PATIENT SURVEYS:  FOTO 47(51 predicted)   COGNITION: Overall cognitive status: Within functional limits for tasks assessed                         SENSATION: Not tested   MUSCLE LENGTH: Hamstrings: Right 80 deg; Left 80 deg   POSTURE:  not assessed   PALPATION: deferred   LOWER EXTREMITY ROM:   Active ROM Right eval Left eval  Hip flexion 110 85  Hip extension      Hip abduction      Hip adduction      Hip internal rotation      Hip external rotation      Knee flexion [facility] WFL  Knee extension [facility] WFL  Ankle dorsiflexion      Ankle plantarflexion      Ankle inversion      Ankle eversion       (Blank rows = not tested)   LOWER EXTREMITY MMT:   MMT Right eval Left eval  Hip flexion   4-  Hip extension   4-  Hip abduction   4-  Hip adduction      Hip internal rotation      Hip external rotation      Knee flexion   4-  Knee extension   4-  Ankle dorsiflexion      Ankle plantarflexion   4-  Ankle inversion      Ankle eversion       (Blank rows = not tested)   LOWER EXTREMITY SPECIAL TESTS:  Hip special tests: ITB negative    FUNCTIONAL TESTS: 30s chair stand 6 reps arms crossed     GAIT: Distance walked: 27f  Assistive device utilized: WEnvironmental consultant- 2  wheeled Level of assistance: SBA Comments: slow cadence     TODAY'S TREATMENT:    TREATMENT 08/18/22:  [address] therapeutic pool temp 92 degrees Pt enters building independently.  Treatment took place in water 3.8 to  4 ft 8 in.feet deep depending upon activity.  Pt entered and exited the pool via stair and handrails    Aquatic Therapy:  Water walking for warm up fwd/lat with water dumbell  Standing: Standing march - 3x10 ea Hamstring curl 2x10 ea BIL Hip abd/add 2x10 BIL Hip Circles CC/CCW 2x10 each BIL Heel raises - 3x20 Hip ext/flex with knee straight x 20 BIL Mini squats x20  Sitting on bench in water: Bicycle kicks x1' Reverse bicycle kicks x1' Flutter kicks x1' Scissor kicks x1' Seated dumbell push downs - rainbow - 2x10   Pt requires the buoyancy of water for active assisted exercises with buoyancy supported for strengthening and AROM exercises. Hydrostatic pressure also supports joints by unweighting joint load by at least 50 % in 3-4 feet depth water. 80% in chest to neck deep water. Water will provide assistance with movement using the current and laminar flow while the buoyancy reduces weight bearing. Pt requires the viscosity of the water for resistance with strengthening exercises.  [facility] Adult PT Treatment:                                                DATE: 08/15/22 Therapeutic Exercise: Nustep L4 8 min Seated hamstring stretch 30s x2 Bridge with ball 15x2  Supine march alt. 15/15 x2 2# Supine hip fallouts L GTB 15x2(R against bolster) S/L clams L GTB 15x2 L hip flexor stretch over table 30s x2 FAQs with adduction 15x2 WS onto 6 in block with OH in walker 15/15 emphasize upright posture.  [facility] Adult PT Treatment:                                                DATE: 08/10/22 Therapeutic Exercise: Nustep L4 8 min Bridge with ball 15x2 Supine march alt. 15/15 Supine hip fallouts L RTB 15x2(R against  bolster) S/L clams L 15x Supine heel slides over slide board for proprioception 15x L hip flexor stretch over table 30s x2 L SLR 15x FAQs with adduction 15x WS onto 4 in block with OH in walker 10/10 Heel raise against wall 15x                                                                                                                         [facility] Adult PT Treatment:                                                DATE: 08/08/22 Therapeutic Exercise: Nustep L5 UE/LE x 5 minutes  Supine Left heel slide 10 x 2  Supine clam green band 10 x 2  Banded bridge 10 x 2  Supine March green band x 10 Ball squeeze 10 x 2  STS x 10, without UE Heel raises 10 x 2  Narrow stance with head turns   AIREX with head turn and nods, EO, EC AIREX narrow stance   Therapeutic Activity: Gait 200 feet with RW    [facility] Adult PT Treatment:                                                DATE: 08/03/22 Therapeutic Exercise: Nustep L3 UE/LE x 5 minutes - monitored for hip flexion.  STS 5 x 2 without UE Heel raises 10 x 2  Bridge 10 x 2  Supine marching  Therapeutic Activity: 127 feet x 2 gait training with RW and verbal cues for sequencing of RW and step length   DATE:  08/01/22 Eval and HEP      PATIENT EDUCATION:  Education details: Discussed eval findings, rehab rationale and POC and patient is in agreement  Person educated: Patient Education method: Explanation Education comprehension: verbalized understanding and needs further education   HOME EXERCISE PROGRAM: Access Code: [email or web address] URL: [email or web address] Date: 08/01/2022 Prepared by: Sharlynn Oliphant   Exercises - Sit to Stand Without Arm Support  - 2 x daily - 5 x weekly - 1 sets - 5 reps - Supine Bridge  - 2 x daily - 5 x weekly - 1 sets - 10 reps - Standing Heel Raise with Support  - 2 x daily - 5 x weekly - 1 sets - 10 reps   ASSESSMENT:   CLINICAL IMPRESSION:  Session today focused on hip strengthening and gait  in the aquatic environment for use of buoyancy to offload joints and the viscosity of water as resistance during therapeutic exercise.  Breannia shows significant fatigue with standing exercises.  She reports low back pain which limits standing activity, so we transitioned to seated exercises, which was better tolerated.  Patient was able to tolerate all prescribed exercises in the aquatic environment with no adverse effects and reports 4/10 pain at the end of the session. Patient continues to benefit from skilled PT services on land and aquatic based and should be progressed as able to improve functional independence.    OBJECTIVE IMPAIRMENTS: Abnormal gait, decreased activity tolerance, decreased balance, decreased endurance, decreased mobility, difficulty walking, decreased ROM, decreased strength, impaired perceived functional ability, and pain.    ACTIVITY LIMITATIONS: carrying, lifting, sitting, standing, squatting, sleeping, and stairs   PERSONAL FACTORS: Fitness, Past/current experiences, Time since onset of injury/illness/exacerbation, and 1 comorbidity: DM  are also affecting patient's functional outcome.    REHAB POTENTIAL: Good   CLINICAL DECISION MAKING: Evolving/moderate complexity   EVALUATION COMPLEXITY: Low     GOALS: Goals reviewed with patient? Yes   SHORT TERM GOALS: Target date: 08/22/2022   Patient to demonstrate independence in HEP Baseline:[id number] Goal status: INITIAL   2.  272f ambulation with RW and SBA Baseline: 439fwith RW and SBA Goal status: MET     LONG TERM GOALS: Target date: 09/12/2022     Increase FOTO score to 51 Baseline: 47 Goal status: INITIAL   2.  Decrease worst pain to 3/10 Baseline: 7/10 Goal status: INITIAL   3.  Increase RLE strength to 4/5 Baseline:  MMT Right eval Left eval  Hip flexion   4-  Hip extension   4-  Hip abduction   4-  Hip adduction      Hip internal rotation      Hip external rotation      Knee flexion    4-  Knee extension   4-  Ankle dorsiflexion      Ankle plantarflexion   4-    Goal status: INITIAL   4.  Patient to ambulate 200 ft with LRAD Baseline: 4459fith RW Goal status: INITIAL         PLAN:   PT FREQUENCY: 2x/week   PT DURATION: 6 weeks   PLANNED INTERVENTIONS: Therapeutic exercises, Therapeutic activity, Neuromuscular re-education, Balance training, Gait training, Patient/Family education, Self Care, Joint mobilization, Stair training, DME instructions, Aquatic Therapy, and Re-evaluation   PLAN FOR NEXT SESSION: HEP review and update, aerobic tasks, LE strengthening, gait training, balance/proprioceptive work      KarMathis Dad 08/18/22 3:27 PM Phone: [phone]: [phone]

## 2022-08-22 ENCOUNTER — Ambulatory Visit: Payer: 59

## 2022-08-22 DIAGNOSIS — Z96649 Presence of unspecified artificial hip joint: Secondary | ICD-10-CM | POA: Diagnosis not present

## 2022-08-22 DIAGNOSIS — M25552 Pain in left hip: Secondary | ICD-10-CM

## 2022-08-22 DIAGNOSIS — M6281 Muscle weakness (generalized): Secondary | ICD-10-CM

## 2022-08-22 NOTE — Therapy (Signed)
OUTPATIENT PHYSICAL THERAPY TREATMENT NOTE   Patient Name: Kristin [name] MRN: [id number] DOB:1955-12-01, 67 y.o., female Today's Date: 08/22/2022  PCP: Kristin Emery, MD    REFERRING PROVIDER: Elsie Ra, MD  END OF SESSION:   PT End of Session - 08/22/22 1050     Visit Number 7    Number of Visits 12    Date for PT Re-Evaluation 09/26/22    Authorization Type [employer] MCR    PT Start Time 1045    PT Stop Time 1125    PT Time Calculation (min) 40 min    Activity Tolerance Patient tolerated treatment well;Patient limited by fatigue    Behavior During Therapy WFL for tasks assessed/performed               Past Medical History:  Diagnosis Date   Allergy    seasonal   Anemia    Anxiety    Arthritis    Back pain, chronic    Borderline diabetes    Bronchitis    Constipation    current issue   Cough    Depression    Epilepsy ([place])    GERD (gastroesophageal reflux disease)    Hyperlipidemia    Hypertension    IBS (irritable bowel syndrome)    Joint pain    Obesity    Palpitations    Pneumonia 2016   Pre-diabetes    Renal sclerosis, unspecified    only has 1 kidney   Rheumatoid arthritis ([place])    Seizures ([place])    last >15 +  yrs ago- recorded 07-26-2021   Somatization disorder    Tobacco abuse    Past Surgical History:  Procedure Laterality Date   ABDOMINAL HYSTERECTOMY     ANTERIOR HIP REVISION Left 07/25/2016   Procedure: LEFT HIP IRRIGATION AND DEBRIDEMENT, REVISION OF HEAD AND LINER;  Surgeon: Kristin Koyanagi, MD;  Location: [place];  Service: Orthopedics;  Laterality: Left;   APPLICATION OF WOUND VAC Left 12/23/2019   Procedure: APPLICATION OF WOUND VAC;  Surgeon: Kristin Koyanagi, MD;  Location: [place];  Service: Orthopedics;  Laterality: Left;   BREAST EXCISIONAL BIOPSY Right    CHOLECYSTECTOMY     COLONOSCOPY     DEBRIDEMENT AND CLOSURE WOUND Left 07/17/2019   Procedure: Excision of left leg wound with ACell placement and primary closure;  Surgeon:  Kristin Going, DO;  Location: [place];  Service: Plastics;  Laterality: Left;  60 min   EXCISIONAL TOTAL HIP ARTHROPLASTY WITH ANTIBIOTIC SPACERS Left 12/23/2019   Procedure: LEFT HIP IRRIGATION AND DEBRIDEMENT LEFT HIP WOUND and poly exchange ;  Surgeon: Kristin Koyanagi, MD;  Location: [place];  Service: Orthopedics;  Laterality: Left;   HIP DEBRIDEMENT Left 01/19/2018   Procedure(s) Performed: IRRIGATION AND DEBRIDEMENT LEFT HIP (Left )   INCISION AND DRAINAGE HIP Left 08/03/2016   Procedure: IRRIGATION AND DEBRIDEMENT LEFT HIP, VAC PLACEMENT;  Surgeon: Kristin Koyanagi, MD;  Location: [place];  Service: Orthopedics;  Laterality: Left;   INCISION AND DRAINAGE HIP Left 01/19/2018   Procedure: IRRIGATION AND DEBRIDEMENT LEFT HIP;  Surgeon: Kristin Koyanagi, MD;  Location: [place];  Service: Orthopedics;  Laterality: Left;   kidney stent Right 2002   TONSILLECTOMY Bilateral 12/06/2014   Procedure: TONSILLECTOMY, BILATERAL;  Surgeon: Kristin Cola, MD;  Location: [place];  Service: ENT;  Laterality: Bilateral;   TONSILLECTOMY     TOTAL HIP ARTHROPLASTY Left 06/01/2016   Procedure: LEFT TOTAL HIP  ARTHROPLASTY ANTERIOR APPROACH;  Surgeon: Kristin Koyanagi, MD;  Location: [place];  Service: Orthopedics;  Laterality: Left;   TUBAL LIGATION     WOUND EXPLORATION Left 01/19/2018   Procedure: WOUND EXPLORATION;  Surgeon: Kristin Koyanagi, MD;  Location: [place];  Service: Orthopedics;  Laterality: Left;   Patient Active Problem List   Diagnosis Date Noted   Urinary retention 12/13/2021   Postoperative anemia due to acute blood loss 12/10/2021   Presence of left artificial hip joint 12/10/2021   Diabetes mellitus type 2 in obese ([place]) 12/09/2021   Infection and inflammatory reaction due to other internal joint prosthesis, initial encounter ([place]) 12/09/2021   Preop examination 12/09/2021   HTN (hypertension) 12/08/2021   Arthritis 11/26/2021   Seizures ([place]) 11/26/2021   Rash 09/18/2020   Worsening  headaches 01/21/2020   Pruritus 10/30/2019   Hypercalcemia 10/24/2019   Body mass index 45.0-49.9, adult ([place]) 08/27/2019   Intertrigo 07/09/2019   Facial rash 07/27/2018   Body mass index 40.0-44.9, adult ([place]) 02/15/2018   Fatigue associated with anemia 02/07/2018   Unspecified open wound, left hip, initial encounter 01/19/2018   Wound of left leg, sequela 10/03/2016   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[place]) 08/24/2016   Status post left hip replacement 06/01/2016   Sinus tachycardia 05/19/2016   History of adenomatous polyp of colon 03/21/2016   Diabetes ([place]) 01/21/2016   Thyroid nodule 12/25/2014   Infiltrate noted on imaging study    Cervical spine arthritis 11/14/2014   Primary osteoarthritis of left hip 11/14/2014   Loss of consciousness ([place]) 11/14/2014   Chest pain 03/26/2014   Musculoskeletal chest and rib pain 03/26/2014   Rib pain on right side 02/28/2014   Groin pain 04/18/2013   UNSPECIFIED RENAL SCLEROSIS 01/16/2009   GERD, SEVERE 05/25/2007   Morbid obesity ([place]) 04/03/2007   Dyslipidemia 08/31/2006   Major depression, recurrent ([place]) 08/31/2006   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   Former smoker 08/31/2006   HYPERTENSION, BENIGN SYSTEMIC 08/31/2006   GASTRIC ULCER ACUTE WITHOUT HEMORRHAGE 08/31/2006   CONVULSIONS, SEIZURES, NOS 08/31/2006    REFERRING DIAG: [id number] (ICD-10-CM) - Presence of left artificial hip joint  THERAPY DIAG: Presence of left artificial hip joint   Rationale for Evaluation and Treatment Rehabilitation  PERTINENT HISTORY: Patient ID: Kristin [name] is a 67 y.o. female.  Chief Complaint: Follow-up of the Left Hip  Months out from reimplantation over left total hip for chronic periprosthetic joint infection. Otherwise she is doing well. She rates her pain as a 3/10. She has been partial weight-bearing. She has had some accumulation of her seroma since removal of her drains. She has been off all of her antibiotics. Cultures  were negative at the time of surgery   PRECAUTIONS:  posterior hip precautions   WEIGHT BEARING RESTRICTIONS: Yes WBAT      SUBJECTIVE:  SUBJECTIVE STATEMENT:  Cites pain following treatment sessions, lasts 24 hours but then resolves.  Pain level 7/10 at worst   PAIN:  Are you having pain? Yes: NPRS scale: 5/10 left, right hip 2-3/10 Pain location: L hip Pain description: ache Aggravating factors: walking Relieving factors: rest and meds   OBJECTIVE: (objective measures completed at initial evaluation unless otherwise dated)   DIAGNOSTIC FINDINGS: none noted   PATIENT SURVEYS:  FOTO 47(51 predicted)   COGNITION: Overall cognitive status: Within functional limits for tasks assessed                         SENSATION: Not tested   MUSCLE LENGTH: Hamstrings: Right 80 deg; Left 80 deg   POSTURE:  not assessed   PALPATION: deferred   LOWER EXTREMITY ROM:   Active ROM Right eval Left eval  Hip flexion 110 85  Hip extension      Hip abduction      Hip adduction      Hip internal rotation      Hip external rotation      Knee flexion [facility] WFL  Knee extension [facility] WFL  Ankle dorsiflexion      Ankle plantarflexion      Ankle inversion      Ankle eversion       (Blank rows = not tested)   LOWER EXTREMITY MMT:   MMT Right eval Left eval  Hip flexion   4-  Hip extension   4-  Hip abduction   4-  Hip adduction      Hip internal rotation      Hip external rotation      Knee flexion   4-  Knee extension   4-  Ankle dorsiflexion      Ankle plantarflexion   4-  Ankle inversion      Ankle eversion       (Blank rows = not tested)   LOWER EXTREMITY SPECIAL TESTS:  Hip special tests: ITB negative    FUNCTIONAL TESTS: 30s chair stand 6 reps arms crossed     GAIT: Distance walked:  42f  Assistive device utilized: WEnvironmental consultant- 2 wheeled Level of assistance: SBA Comments: slow cadence     TODAY'S TREATMENT:   [facility] Adult PT Treatment:                                                DATE: 08/22/22 Therapeutic Exercise: Nustep L4 8 min Seated hamstring stretch 30s x2 Bridge with ball 15x2  Supine march alt. 15/15 x2 2# Supine hip fallouts Bil GTB 15x ea S/L clams L GTB 15x2 L hip flexor stretch over table 30s x2 FAQs with adduction 15x2 2# WS onto 6 in block with OH in walker 15/15 emphasize upright posture.   TREATMENT 08/18/22:  Aquatic therapy at [address] - therapeutic pool temp 92 degrees Pt enters building independently.  Treatment took place in water 3.8 to  4 ft 8 in.feet deep depending upon activity.  Pt entered and exited the pool via stair and handrails    Aquatic Therapy:  Water walking for warm up fwd/lat with water dumbell  Standing: Standing march - 3x10 ea Hamstring curl 2x10 ea BIL Hip abd/add 2x10 BIL Hip Circles CC/CCW 2x10 each BIL Heel raises - 3x20 Hip ext/flex with knee  straight x 20 BIL Mini squats x20  Sitting on bench in water: Bicycle kicks x1' Reverse bicycle kicks x1' Flutter kicks x1' Scissor kicks x1' Seated dumbell push downs - rainbow - 2x10   Pt requires the buoyancy of water for active assisted exercises with buoyancy supported for strengthening and AROM exercises. Hydrostatic pressure also supports joints by unweighting joint load by at least 50 % in 3-4 feet depth water. 80% in chest to neck deep water. Water will provide assistance with movement using the current and laminar flow while the buoyancy reduces weight bearing. Pt requires the viscosity of the water for resistance with strengthening exercises.        [facility] Adult PT Treatment:                                                DATE: 08/15/22 Therapeutic Exercise: Nustep L4 8 min Seated hamstring stretch 30s x2 Bridge with ball 15x2   Supine march alt. 15/15 x2 2# Supine hip fallouts L GTB 15x2(R against bolster) S/L clams L GTB 15x2 L hip flexor stretch over table 30s x2 FAQs with adduction 15x2 WS onto 6 in block with OH in walker 15/15 emphasize upright posture.  [facility] Adult PT Treatment:                                                DATE: 08/10/22 Therapeutic Exercise: Nustep L4 8 min Bridge with ball 15x2 Supine march alt. 15/15 Supine hip fallouts L RTB 15x2(R against bolster) S/L clams L 15x Supine heel slides over slide board for proprioception 15x L hip flexor stretch over table 30s x2 L SLR 15x FAQs with adduction 15x WS onto 4 in block with OH in walker 10/10 Heel raise against wall 15x                                                                                                                            DATE: 08/01/22 Eval and HEP      PATIENT EDUCATION:  Education details: Discussed eval findings, rehab rationale and POC and patient is in agreement  Person educated: Patient Education method: Explanation Education comprehension: verbalized understanding and needs further education   HOME EXERCISE PROGRAM: Access Code: [email or web address] URL: [email or web address] Date: 08/01/2022 Prepared by: Sharlynn Oliphant   Exercises - Sit to Stand Without Arm Support  - 2 x daily - 5 x weekly - 1 sets - 5 reps - Supine Bridge  - 2 x daily - 5 x weekly - 1 sets - 10 reps - Standing Heel Raise with Support  - 2 x daily - 5 x weekly - 1 sets - 10 reps   ASSESSMENT:  CLINICAL IMPRESSION: Pain levels fluctuate b/t session, worse following treatment but then resolves.  Increased swelling/fluid buildup noted in L hip and lateral  thigh.  Patient dos not have f/u with surgeon as he has moved out of state and was encouraged to schedule an appointment to address ongoing issues.  Due to persistent pain levels only mild increases in resistance and difficulty incorporated.   OBJECTIVE IMPAIRMENTS: Abnormal  gait, decreased activity tolerance, decreased balance, decreased endurance, decreased mobility, difficulty walking, decreased ROM, decreased strength, impaired perceived functional ability, and pain.    ACTIVITY LIMITATIONS: carrying, lifting, sitting, standing, squatting, sleeping, and stairs   PERSONAL FACTORS: Fitness, Past/current experiences, Time since onset of injury/illness/exacerbation, and 1 comorbidity: DM  are also affecting patient's functional outcome.    REHAB POTENTIAL: Good   CLINICAL DECISION MAKING: Evolving/moderate complexity   EVALUATION COMPLEXITY: Low     GOALS: Goals reviewed with patient? Yes   SHORT TERM GOALS: Target date: 08/22/2022   Patient to demonstrate independence in HEP Baseline:[id number] Goal status: Met   2.  263f ambulation with RW and SBA Baseline: 418fwith RW and SBA Goal status: ongoing     LONG TERM GOALS: Target date: 09/12/2022     Increase FOTO score to 51 Baseline: 47 Goal status: INITIAL   2.  Decrease worst pain to 3/10 Baseline: 7/10 Goal status: INITIAL   3.  Increase RLE strength to 4/5 Baseline:  MMT Right eval Left eval  Hip flexion   4-  Hip extension   4-  Hip abduction   4-  Hip adduction      Hip internal rotation      Hip external rotation      Knee flexion   4-  Knee extension   4-  Ankle dorsiflexion      Ankle plantarflexion   4-    Goal status: INITIAL   4.  Patient to ambulate 200 ft with LRAD Baseline: 4414fith RW Goal status: INITIAL         PLAN:   PT FREQUENCY: 2x/week   PT DURATION: 6 weeks   PLANNED INTERVENTIONS: Therapeutic exercises, Therapeutic activity, Neuromuscular re-education, Balance training, Gait training, Patient/Family education, Self Care, Joint mobilization, Stair training, DME instructions, Aquatic Therapy, and Re-evaluation   PLAN FOR NEXT SESSION: HEP review and update, aerobic tasks, LE strengthening, gait training, balance/proprioceptive work       JefLanice Shirts 08/22/22 11:42 AM Phone: [phone]: [phone]

## 2022-08-24 ENCOUNTER — Encounter: Payer: Self-pay | Admitting: Physical Therapy

## 2022-08-24 ENCOUNTER — Ambulatory Visit: Payer: 59 | Admitting: Physical Therapy

## 2022-08-24 DIAGNOSIS — M25552 Pain in left hip: Secondary | ICD-10-CM

## 2022-08-24 DIAGNOSIS — Z96649 Presence of unspecified artificial hip joint: Secondary | ICD-10-CM

## 2022-08-24 DIAGNOSIS — M6281 Muscle weakness (generalized): Secondary | ICD-10-CM

## 2022-08-24 NOTE — Therapy (Signed)
OUTPATIENT PHYSICAL THERAPY TREATMENT NOTE   Patient Name: Kristin [name] MRN: [id number] DOB:10/24/1955, 67 y.o., female Today's Date: 08/24/2022  PCP: Erskine Emery, MD    REFERRING PROVIDER: Elsie Ra, MD  END OF SESSION:   PT End of Session - 08/24/22 1553     Visit Number 8    Number of Visits 12    Date for PT Re-Evaluation 09/26/22    Authorization Type UHC MCR    PT Start Time [id number]    PT Stop Time 1635    PT Time Calculation (min) 44 min    Activity Tolerance Patient tolerated treatment well;Patient limited by fatigue    Behavior During Therapy WFL for tasks assessed/performed               Past Medical History:  Diagnosis Date   Allergy    seasonal   Anemia    Anxiety    Arthritis    Back pain, chronic    Borderline diabetes    Bronchitis    Constipation    current issue   Cough    Depression    Epilepsy ([place])    GERD (gastroesophageal reflux disease)    Hyperlipidemia    Hypertension    IBS (irritable bowel syndrome)    Joint pain    Obesity    Palpitations    Pneumonia 2016   Pre-diabetes    Renal sclerosis, unspecified    only has 1 kidney   Rheumatoid arthritis ([place])    Seizures ([place])    last >15 +  yrs ago- recorded 07-26-2021   Somatization disorder    Tobacco abuse    Past Surgical History:  Procedure Laterality Date   ABDOMINAL HYSTERECTOMY     ANTERIOR HIP REVISION Left 07/25/2016   Procedure: LEFT HIP IRRIGATION AND DEBRIDEMENT, REVISION OF HEAD AND LINER;  Surgeon: Leandrew Koyanagi, MD;  Location: [facility];  Service: Orthopedics;  Laterality: Left;   APPLICATION OF WOUND VAC Left 12/23/2019   Procedure: APPLICATION OF WOUND VAC;  Surgeon: Leandrew Koyanagi, MD;  Location: [facility];  Service: Orthopedics;  Laterality: Left;   BREAST EXCISIONAL BIOPSY Right    CHOLECYSTECTOMY     COLONOSCOPY     DEBRIDEMENT AND CLOSURE WOUND Left 07/17/2019   Procedure: Excision of left leg wound with ACell placement and primary closure;  Surgeon:  Wallace Going, DO;  Location: [facility];  Service: Plastics;  Laterality: Left;  60 min   EXCISIONAL TOTAL HIP ARTHROPLASTY WITH ANTIBIOTIC SPACERS Left 12/23/2019   Procedure: LEFT HIP IRRIGATION AND DEBRIDEMENT LEFT HIP WOUND and poly exchange ;  Surgeon: Leandrew Koyanagi, MD;  Location: [facility];  Service: Orthopedics;  Laterality: Left;   HIP DEBRIDEMENT Left 01/19/2018   Procedure(s) Performed: IRRIGATION AND DEBRIDEMENT LEFT HIP (Left )   INCISION AND DRAINAGE HIP Left 08/03/2016   Procedure: IRRIGATION AND DEBRIDEMENT LEFT HIP, VAC PLACEMENT;  Surgeon: Leandrew Koyanagi, MD;  Location: [facility];  Service: Orthopedics;  Laterality: Left;   INCISION AND DRAINAGE HIP Left 01/19/2018   Procedure: IRRIGATION AND DEBRIDEMENT LEFT HIP;  Surgeon: Leandrew Koyanagi, MD;  Location: [facility];  Service: Orthopedics;  Laterality: Left;   kidney stent Right 2002   TONSILLECTOMY Bilateral 12/06/2014   Procedure: TONSILLECTOMY, BILATERAL;  Surgeon: Ruby Cola, MD;  Location: [facility];  Service: ENT;  Laterality: Bilateral;   TONSILLECTOMY     TOTAL HIP ARTHROPLASTY Left 06/01/2016   Procedure: LEFT TOTAL HIP  ARTHROPLASTY ANTERIOR APPROACH;  Surgeon: Leandrew Koyanagi, MD;  Location: [facility];  Service: Orthopedics;  Laterality: Left;   TUBAL LIGATION     WOUND EXPLORATION Left 01/19/2018   Procedure: WOUND EXPLORATION;  Surgeon: Leandrew Koyanagi, MD;  Location: [facility];  Service: Orthopedics;  Laterality: Left;   Patient Active Problem List   Diagnosis Date Noted   Urinary retention 12/13/2021   Postoperative anemia due to acute blood loss 12/10/2021   Presence of left artificial hip joint 12/10/2021   Diabetes mellitus type 2 in obese ([place]) 12/09/2021   Infection and inflammatory reaction due to other internal joint prosthesis, initial encounter ([place]) 12/09/2021   Preop examination 12/09/2021   HTN (hypertension) 12/08/2021   Arthritis 11/26/2021   Seizures ([place]) 11/26/2021   Rash 09/18/2020   Worsening  headaches 01/21/2020   Pruritus 10/30/2019   Hypercalcemia 10/24/2019   Body mass index 45.0-49.9, adult ([place]) 08/27/2019   Intertrigo 07/09/2019   Facial rash 07/27/2018   Body mass index 40.0-44.9, adult ([place]) 02/15/2018   Fatigue associated with anemia 02/07/2018   Unspecified open wound, left hip, initial encounter 01/19/2018   Wound of left leg, sequela 10/03/2016   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[place]) 08/24/2016   Status post left hip replacement 06/01/2016   Sinus tachycardia 05/19/2016   History of adenomatous polyp of colon 03/21/2016   Diabetes ([place]) 01/21/2016   Thyroid nodule 12/25/2014   Infiltrate noted on imaging study    Cervical spine arthritis 11/14/2014   Primary osteoarthritis of left hip 11/14/2014   Loss of consciousness ([place]) 11/14/2014   Chest pain 03/26/2014   Musculoskeletal chest and rib pain 03/26/2014   Rib pain on right side 02/28/2014   Groin pain 04/18/2013   UNSPECIFIED RENAL SCLEROSIS 01/16/2009   GERD, SEVERE 05/25/2007   Morbid obesity ([place]) 04/03/2007   Dyslipidemia 08/31/2006   Major depression, recurrent ([place]) 08/31/2006   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   Former smoker 08/31/2006   HYPERTENSION, BENIGN SYSTEMIC 08/31/2006   GASTRIC ULCER ACUTE WITHOUT HEMORRHAGE 08/31/2006   CONVULSIONS, SEIZURES, NOS 08/31/2006    REFERRING DIAG: [id number] (ICD-10-CM) - Presence of left artificial hip joint  THERAPY DIAG: Presence of left artificial hip joint   Rationale for Evaluation and Treatment Rehabilitation  PERTINENT HISTORY: Patient ID: Kristin [name] is a 67 y.o. female.  Chief Complaint: Follow-up of the Left Hip  Months out from reimplantation over left total hip for chronic periprosthetic joint infection. Otherwise she is doing well. She rates her pain as a 3/10. She has been partial weight-bearing. She has had some accumulation of her seroma since removal of her drains. She has been off all of her antibiotics. Cultures  were negative at the time of surgery   PRECAUTIONS:  posterior hip precautions   WEIGHT BEARING RESTRICTIONS: Yes WBAT      SUBJECTIVE:  SUBJECTIVE STATEMENT:   Pain level 5/10 upon entering pool area   PAIN:  Are you having pain? Yes: NPRS scale: 5/10 left, right hip 2-3/10 Pain location: L hip Pain description: ache Aggravating factors: walking Relieving factors: rest and meds   OBJECTIVE: (objective measures completed at initial evaluation unless otherwise dated)   DIAGNOSTIC FINDINGS: none noted   PATIENT SURVEYS:  FOTO 47(51 predicted)   COGNITION: Overall cognitive status: Within functional limits for tasks assessed                         SENSATION: Not tested   MUSCLE LENGTH: Hamstrings: Right 80 deg; Left 80 deg   POSTURE:  not assessed   PALPATION: deferred   LOWER EXTREMITY ROM:   Active ROM Right eval Left eval  Hip flexion 110 85  Hip extension      Hip abduction      Hip adduction      Hip internal rotation      Hip external rotation      Knee flexion [facility] WFL  Knee extension [facility] WFL  Ankle dorsiflexion      Ankle plantarflexion      Ankle inversion      Ankle eversion       (Blank rows = not tested)   LOWER EXTREMITY MMT:   MMT Right eval Left eval  Hip flexion   4-  Hip extension   4-  Hip abduction   4-  Hip adduction      Hip internal rotation      Hip external rotation      Knee flexion   4-  Knee extension   4-  Ankle dorsiflexion      Ankle plantarflexion   4-  Ankle inversion      Ankle eversion       (Blank rows = not tested)   LOWER EXTREMITY SPECIAL TESTS:  Hip special tests: ITB negative    FUNCTIONAL TESTS: 30s chair stand 6 reps arms crossed     GAIT: Distance walked: 11f  Assistive device utilized: WEnvironmental consultant- 2 wheeled Level  of assistance: SBA Comments: slow cadence     TODAY'S TREATMENT:   [facility]                                                DATE: 08-24-22 [address]atic [address] - therapeutic pool temp 92 degrees Pt enters building independently but needed W/C to transport to area and needed to be wheeled to handrails of pool.    Treatment took place in water 3.8 to  4 ft 8 in.feet deep depending upon activity.  Pt entered and exited the pool via stair and handrails   Pain level 5/10 at beginning of session.  At end 3/10 Aquatic Therapy:  Water walking for warm up fwd/lat with aqua jogger  Standing:all exercise using aquatic fins for added resistance  Standing march - 3x10 ea Hamstring curl 2x10 ea BIL Hip abd/add 2x10 BIL Hip Circles CC/CCW 2x10 each BIL Heel raises - 3x20 Hip ext/flex with knee straight x 20 BIL Mini squats x20 3 way hip kicks on RT and on LT 15 x  Sitting on bench in water: no aquatic fins Bicycle kicks x1' Reverse bicycle kicks x1' Flutter kicks x1' Scissor  kicks x1' Seated dumbell push downs - rainbow - 2x10 Aqua stretch over LT hip /scar tissue massage over distended tissue/liquid pouch Ended session with water ambulation forward and backward without AD or aquatic flotation and increased stride length with aquatic fins bil Pt requires the buoyancy of water for active assisted exercises with buoyancy supported for strengthening and AROM exercises. Hydrostatic pressure also supports joints by unweighting joint load by at least 50 % in 3-4 feet depth water. 80% in chest to neck deep water. Water will provide assistance with movement using the current and laminar flow while the buoyancy reduces weight bearing. Pt requires the viscosity of the water for resistance with strengthening exercises.    [facility] Adult PT Treatment:                                                DATE: 08/22/22 Therapeutic Exercise: Nustep L4 8 min Seated hamstring  stretch 30s x2 Bridge with ball 15x2  Supine march alt. 15/15 x2 2# Supine hip fallouts Bil GTB 15x ea S/L clams L GTB 15x2 L hip flexor stretch over table 30s x2 FAQs with adduction 15x2 2# WS onto 6 in block with OH in walker 15/15 emphasize upright posture.   TREATMENT 08/18/22:  Aquatic therapy at [address] - therapeutic pool temp 92 degrees Pt enters building independently.  Treatment took place in water 3.8 to  4 ft 8 in.feet deep depending upon activity.  Pt entered and exited the pool via stair and handrails    Aquatic Therapy:  Water walking for warm up fwd/lat with water dumbell  Standing: Standing march - 3x10 ea Hamstring curl 2x10 ea BIL Hip abd/add 2x10 BIL Hip Circles CC/CCW 2x10 each BIL Heel raises - 3x20 Hip ext/flex with knee straight x 20 BIL Mini squats x20  Sitting on bench in water: Bicycle kicks x1' Reverse bicycle kicks x1' Flutter kicks x1' Scissor kicks x1' Seated dumbell push downs - rainbow - 2x10   Pt requires the buoyancy of water for active assisted exercises with buoyancy supported for strengthening and AROM exercises. Hydrostatic pressure also supports joints by unweighting joint load by at least 50 % in 3-4 feet depth water. 80% in chest to neck deep water. Water will provide assistance with movement using the current and laminar flow while the buoyancy reduces weight bearing. Pt requires the viscosity of the water for resistance with strengthening exercises.        [facility] Adult PT Treatment:                                                DATE: 08/15/22 Therapeutic Exercise: Nustep L4 8 min Seated hamstring stretch 30s x2 Bridge with ball 15x2  Supine march alt. 15/15 x2 2# Supine hip fallouts L GTB 15x2(R against bolster) S/L clams L GTB 15x2 L hip flexor stretch over table 30s x2 FAQs with adduction 15x2 WS onto 6 in block with OH in walker 15/15 emphasize upright posture.  [facility] Adult PT Treatment:  DATE: 08/10/22 Therapeutic Exercise: Nustep L4 8 min Bridge with ball 15x2 Supine march alt. 15/15 Supine hip fallouts L RTB 15x2(R against bolster) S/L clams L 15x Supine heel slides over slide board for proprioception 15x L hip flexor stretch over table 30s x2 L SLR 15x FAQs with adduction 15x WS onto 4 in block with OH in walker 10/10 Heel raise against wall 15x                                                                                                                            DATE: 08/01/22 Eval and HEP      PATIENT EDUCATION:  Education details: Discussed eval findings, rehab rationale and POC and patient is in agreement  Person educated: Patient Education method: Explanation Education comprehension: verbalized understanding and needs further education   HOME EXERCISE PROGRAM: Access Code: [email or web address] URL: [email or web address] Date: 08/01/2022 Prepared by: Sharlynn Oliphant   Exercises - Sit to Stand Without Arm Support  - 2 x daily - 5 x weekly - 1 sets - 5 reps - Supine Bridge  - 2 x daily - 5 x weekly - 1 sets - 10 reps - Standing Heel Raise with Support  - 2 x daily - 5 x weekly - 1 sets - 10 reps   ASSESSMENT:   CLINICAL IMPRESSION: Pt enters clinic with W/C to pool area.  Pt utilizes walker after session to walk to bathroom to change.  Pt 5/10 at beginning and ends with 3/10 pain.   Pt responds well to treatment today in aquatics with no adverse reaction to exercise or aqua stretch/ scar tissue massage and decreased pain.  Pt with distended tissues over LT hip and is pursing further workup with MD to address fluid build up and fluid filled sack over LT hip and surgical site. Pt requires the buoyancy of water for active assisted exercises with buoyancy supported for strengthening and AROM exercises. Water will provide assistance with movement using the current and laminar flow while the buoyancy reduces weight bearing. Pt  requires the viscosity of the water for resistance with strengthening exercises.    OBJECTIVE IMPAIRMENTS: Abnormal gait, decreased activity tolerance, decreased balance, decreased endurance, decreased mobility, difficulty walking, decreased ROM, decreased strength, impaired perceived functional ability, and pain.    ACTIVITY LIMITATIONS: carrying, lifting, sitting, standing, squatting, sleeping, and stairs   PERSONAL FACTORS: Fitness, Past/current experiences, Time since onset of injury/illness/exacerbation, and 1 comorbidity: DM  are also affecting patient's functional outcome.    REHAB POTENTIAL: Good   CLINICAL DECISION MAKING: Evolving/moderate complexity   EVALUATION COMPLEXITY: Low     GOALS: Goals reviewed with patient? Yes   SHORT TERM GOALS: Target date: 08/22/2022   Patient to demonstrate independence in HEP Baseline:[id number] Goal status: Met   2.  265f ambulation with RW and SBA Baseline: 44fwith RW and SBA Goal status: ongoing     LONG TERM GOALS: Target date: 09/12/2022     Increase FOTO  score to 51 Baseline: 47 Goal status: INITIAL   2.  Decrease worst pain to 3/10 Baseline: 7/10 Goal status: INITIAL   3.  Increase RLE strength to 4/5 Baseline:  MMT Right eval Left eval  Hip flexion   4-  Hip extension   4-  Hip abduction   4-  Hip adduction      Hip internal rotation      Hip external rotation      Knee flexion   4-  Knee extension   4-  Ankle dorsiflexion      Ankle plantarflexion   4-    Goal status: INITIAL   4.  Patient to ambulate 200 ft with LRAD Baseline: 36f with RW Goal status: INITIAL         PLAN:   PT FREQUENCY: 2x/week   PT DURATION: 6 weeks   PLANNED INTERVENTIONS: Therapeutic exercises, Therapeutic activity, Neuromuscular re-education, Balance training, Gait training, Patient/Family education, Self Care, Joint mobilization, Stair training, DME instructions, Aquatic Therapy, and Re-evaluation   PLAN FOR NEXT  SESSION: HEP review and update, aerobic tasks, LE strengthening, gait training, balance/proprioceptive work      LVoncille Lo PT, AWatts Plastic Surgery Association PcCertified Exercise Expert for the Aging Adult  08/24/22 4:44 PM Phone: [phone]: [phone]

## 2022-08-29 ENCOUNTER — Ambulatory Visit: Payer: 59 | Admitting: Physical Therapy

## 2022-08-31 ENCOUNTER — Ambulatory Visit: Payer: 59 | Admitting: Physical Therapy

## 2022-09-02 ENCOUNTER — Ambulatory Visit (INDEPENDENT_AMBULATORY_CARE_PROVIDER_SITE_OTHER): Payer: 59

## 2022-09-02 ENCOUNTER — Ambulatory Visit (INDEPENDENT_AMBULATORY_CARE_PROVIDER_SITE_OTHER): Payer: 59 | Admitting: Orthopaedic Surgery

## 2022-09-02 DIAGNOSIS — M25552 Pain in left hip: Secondary | ICD-10-CM

## 2022-09-02 DIAGNOSIS — T8452XD Infection and inflammatory reaction due to internal left hip prosthesis, subsequent encounter: Secondary | ICD-10-CM

## 2022-09-02 NOTE — Progress Notes (Signed)
Office Visit Note   Patient: Kristin [name]           Date of Birth: [birth year]           MRN: [id number] Visit Date: 09/02/2022              Requested by: Erskine Emery, MD [address] [place],  [place] [address] PCP: Erskine Emery, MD   Assessment & Plan: Visit Diagnoses:  1. Pain in left hip   2. 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  Given the complex history I have recommended that she follow-up down at [facility] for treatment.  We were able to make an appointment for her to see Dr. Darcus Austin next Tuesday.  Questions encouraged and answered.  Follow-Up Instructions: No follow-ups on file.   Orders:  Orders Placed This Encounter  Procedures   XR HIP UNILAT W OR W/O PELVIS 2-3 VIEWS LEFT   Ambulatory referral to Orthopedic Surgery   No orders of the defined types were placed in this encounter.     Procedures: No procedures performed   Clinical Data: No additional findings.   Subjective: Chief Complaint  Patient presents with   Left Hip - Pain    HPI  Kristin [name] is a 67 year old female who is well-known to me who has had a very complex surgical history to her left hip.  I sent her down to [facility] last year to be enrolled in their prosthetic joint infection study.  She was treated with a two-stage revision by Dr. Dallie Piles who has since left [facility].  She comes in today to be evaluated for a boil that she discovered about 5 days ago.  Denies any constitutional symptoms.  She has been off of antibiotics since her second stage revision surgery which was in November 2023.  Review of Systems   Objective: Vital Signs: There were no vitals taken for this visit.  Physical Exam  Ortho Exam  Examination of the left hip shows multiple fully healed surgical scars.  She has a fluctuant area in her  left buttock posterior to the surgical scar.  This is slightly tender.  There is no surrounding cellulitis.  Specialty Comments:  No specialty comments available.  Imaging: XR HIP UNILAT W OR W/O PELVIS 2-3 VIEWS LEFT  Result Date: 09/02/2022 X-rays demonstrate revision total hip components without any complications.  There are 2 metallic cerclage cables around the proximal femur.  The ETO is healing.  The components are in acceptable position.    PMFS History: Patient Active Problem List   Diagnosis Date Noted   Urinary retention 12/13/2021   Postoperative anemia due to acute blood loss 12/10/2021   Presence of left artificial hip joint 12/10/2021   Diabetes mellitus type 2 in obese ([place]) 12/09/2021   Infection and inflammatory reaction due to other internal joint prosthesis, initial encounter ([place]) 12/09/2021   Preop examination 12/09/2021   HTN (hypertension) 12/08/2021   Arthritis 11/26/2021   Seizures ([place]) 11/26/2021   Rash 09/18/2020   Worsening headaches 01/21/2020   Pruritus 10/30/2019   Hypercalcemia 10/24/2019   Body mass index 45.0-49.9, adult ([place]) 08/27/2019   Intertrigo 07/09/2019   Facial rash 07/27/2018   Body mass index 40.0-44.9, adult ([place]) 02/15/2018   Fatigue associated with anemia 02/07/2018   Unspecified open wound, left hip, initial encounter 01/19/2018  Wound of left leg, sequela 10/03/2016   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[place]) 08/24/2016   Status post left hip replacement 06/01/2016   Sinus tachycardia 05/19/2016   History of adenomatous polyp of colon 03/21/2016   Diabetes ([place]) 01/21/2016   Thyroid nodule 12/25/2014   Infiltrate noted on imaging study    Cervical spine arthritis 11/14/2014   Primary osteoarthritis of left hip 11/14/2014   Loss of consciousness ([place]) 11/14/2014   Chest pain 03/26/2014   Musculoskeletal chest and rib pain 03/26/2014   Rib pain on right side 02/28/2014   Groin pain 04/18/2013   UNSPECIFIED RENAL  SCLEROSIS 01/16/2009   GERD, SEVERE 05/25/2007   Morbid obesity ([place]) 04/03/2007   Dyslipidemia 08/31/2006   Major depression, recurrent ([place]) 08/31/2006   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   Former smoker 08/31/2006   HYPERTENSION, BENIGN SYSTEMIC 08/31/2006   GASTRIC ULCER ACUTE WITHOUT HEMORRHAGE 08/31/2006   CONVULSIONS, SEIZURES, NOS 08/31/2006   Past Medical History:  Diagnosis Date   Allergy    seasonal   Anemia    Anxiety    Arthritis    Back pain, chronic    Borderline diabetes    Bronchitis    Constipation    current issue   Cough    Depression    Epilepsy ([facility])    GERD (gastroesophageal reflux disease)    Hyperlipidemia    Hypertension    IBS (irritable bowel syndrome)    Joint pain    Obesity    Palpitations    Pneumonia 2016   Pre-diabetes    Renal sclerosis, unspecified    only has 1 kidney   Rheumatoid arthritis ([place])    Seizures ([place])    last >15 +  yrs ago- recorded 07-26-2021   Somatization disorder    Tobacco abuse     Family History  Problem Relation Age of Onset   Other Mother        failed surgery   Heart Problems Father        poss MI, not sure:per pt   Colon cancer Father    Colon cancer Maternal Uncle        not sure age of onset   Alzheimer's disease Maternal Grandmother    Heart disease Maternal Grandfather    Colon cancer Other        grandfather   Colon polyps Neg Hx    Esophageal cancer Neg Hx    Stomach cancer Neg Hx    Rectal cancer Neg Hx     Past Surgical History:  Procedure Laterality Date   ABDOMINAL HYSTERECTOMY     ANTERIOR HIP REVISION Left 07/25/2016   Procedure: LEFT HIP IRRIGATION AND DEBRIDEMENT, REVISION OF HEAD AND LINER;  Surgeon: Leandrew Koyanagi, MD;  Location: [facility];  Service: Orthopedics;  Laterality: Left;   APPLICATION OF WOUND VAC Left 12/23/2019   Procedure: APPLICATION OF WOUND VAC;  Surgeon: Leandrew Koyanagi, MD;  Location: [facility];  Service: Orthopedics;  Laterality: Left;   BREAST EXCISIONAL BIOPSY Right     CHOLECYSTECTOMY     COLONOSCOPY     DEBRIDEMENT AND CLOSURE WOUND Left 07/17/2019   Procedure: Excision of left leg wound with ACell placement and primary closure;  Surgeon: Wallace Going, DO;  Location: [facility];  Service: Plastics;  Laterality: Left;  60 min   EXCISIONAL TOTAL HIP ARTHROPLASTY WITH ANTIBIOTIC SPACERS Left 12/23/2019   Procedure: LEFT HIP IRRIGATION AND DEBRIDEMENT LEFT HIP WOUND and poly exchange ;  Surgeon: Leandrew Koyanagi, MD;  Location: [facility];  Service: Orthopedics;  Laterality: Left;   HIP DEBRIDEMENT Left 01/19/2018   Procedure(s) Performed: IRRIGATION AND DEBRIDEMENT LEFT HIP (Left )   INCISION AND DRAINAGE HIP Left 08/03/2016   Procedure: IRRIGATION AND DEBRIDEMENT LEFT HIP, VAC PLACEMENT;  Surgeon: Leandrew Koyanagi, MD;  Location: [facility];  Service: Orthopedics;  Laterality: Left;   INCISION AND DRAINAGE HIP Left 01/19/2018   Procedure: IRRIGATION AND DEBRIDEMENT LEFT HIP;  Surgeon: Leandrew Koyanagi, MD;  Location: [facility];  Service: Orthopedics;  Laterality: Left;   kidney stent Right 2002   TONSILLECTOMY Bilateral 12/06/2014   Procedure: TONSILLECTOMY, BILATERAL;  Surgeon: Ruby Cola, MD;  Location: [facility];  Service: ENT;  Laterality: Bilateral;   TONSILLECTOMY     TOTAL HIP ARTHROPLASTY Left 06/01/2016   Procedure: LEFT TOTAL HIP ARTHROPLASTY ANTERIOR APPROACH;  Surgeon: Leandrew Koyanagi, MD;  Location: [facility];  Service: Orthopedics;  Laterality: Left;   TUBAL LIGATION     WOUND EXPLORATION Left 01/19/2018   Procedure: WOUND EXPLORATION;  Surgeon: Leandrew Koyanagi, MD;  Location: [facility];  Service: Orthopedics;  Laterality: Left;   Social History   Occupational History   Occupation: Not Working  Tobacco Use   Smoking status: Former    Packs/day: 0.25    Years: 0.50    Total pack years: 0.13    Types: Cigarettes    Quit date: 01/27/2014    Years since quitting: 8.6   Smokeless tobacco: Never  Vaping Use   Vaping Use: Never used  Substance and Sexual  Activity   Alcohol use: No   Drug use: No   Sexual activity: Not on file

## 2022-09-06 ENCOUNTER — Other Ambulatory Visit: Payer: Self-pay | Admitting: Student

## 2022-09-08 NOTE — Telephone Encounter (Signed)
Needs appt before next refill

## 2022-09-12 ENCOUNTER — Other Ambulatory Visit: Payer: Self-pay | Admitting: Student

## 2022-09-19 ENCOUNTER — Ambulatory Visit: Payer: 59 | Attending: Orthopaedic Surgery | Admitting: Physical Therapy

## 2022-09-19 ENCOUNTER — Encounter: Payer: Self-pay | Admitting: Physical Therapy

## 2022-09-19 DIAGNOSIS — Z96649 Presence of unspecified artificial hip joint: Secondary | ICD-10-CM | POA: Diagnosis present

## 2022-09-19 DIAGNOSIS — M6281 Muscle weakness (generalized): Secondary | ICD-10-CM | POA: Diagnosis present

## 2022-09-19 DIAGNOSIS — M25552 Pain in left hip: Secondary | ICD-10-CM | POA: Diagnosis present

## 2022-09-19 NOTE — Therapy (Signed)
OUTPATIENT PHYSICAL THERAPY TREATMENT NOTE   Patient Name: Kristin [name] MRN: [id number] DOB:01/24/1956, 67 y.o., female Today's Date: 09/19/2022  PCP: Erskine Emery, MD    REFERRING PROVIDER: Elsie Ra, MD  END OF SESSION:   PT End of Session - 09/19/22 0950     Visit Number 9    Number of Visits 12    Date for PT Re-Evaluation 09/26/22    Authorization Type [employer] MCR    PT Start Time 0945    PT Stop Time 1025    PT Time Calculation (min) 40 min               Past Medical History:  Diagnosis Date   Allergy    seasonal   Anemia    Anxiety    Arthritis    Back pain, chronic    Borderline diabetes    Bronchitis    Constipation    current issue   Cough    Depression    Epilepsy ([place])    GERD (gastroesophageal reflux disease)    Hyperlipidemia    Hypertension    IBS (irritable bowel syndrome)    Joint pain    Obesity    Palpitations    Pneumonia 2016   Pre-diabetes    Renal sclerosis, unspecified    only has 1 kidney   Rheumatoid arthritis ([place])    Seizures ([place])    last >15 +  yrs ago- recorded 07-26-2021   Somatization disorder    Tobacco abuse    Past Surgical History:  Procedure Laterality Date   ABDOMINAL HYSTERECTOMY     ANTERIOR HIP REVISION Left 07/25/2016   Procedure: LEFT HIP IRRIGATION AND DEBRIDEMENT, REVISION OF HEAD AND LINER;  Surgeon: Leandrew Koyanagi, MD;  Location: [place];  Service: Orthopedics;  Laterality: Left;   APPLICATION OF WOUND VAC Left 12/23/2019   Procedure: APPLICATION OF WOUND VAC;  Surgeon: Leandrew Koyanagi, MD;  Location: [place];  Service: Orthopedics;  Laterality: Left;   BREAST EXCISIONAL BIOPSY Right    CHOLECYSTECTOMY     COLONOSCOPY     DEBRIDEMENT AND CLOSURE WOUND Left 07/17/2019   Procedure: Excision of left leg wound with ACell placement and primary closure;  Surgeon: Wallace Going, DO;  Location: [place];  Service: Plastics;  Laterality: Left;  60 min   EXCISIONAL TOTAL HIP  ARTHROPLASTY WITH ANTIBIOTIC SPACERS Left 12/23/2019   Procedure: LEFT HIP IRRIGATION AND DEBRIDEMENT LEFT HIP WOUND and poly exchange ;  Surgeon: Leandrew Koyanagi, MD;  Location: [place];  Service: Orthopedics;  Laterality: Left;   HIP DEBRIDEMENT Left 01/19/2018   Procedure(s) Performed: IRRIGATION AND DEBRIDEMENT LEFT HIP (Left )   INCISION AND DRAINAGE HIP Left 08/03/2016   Procedure: IRRIGATION AND DEBRIDEMENT LEFT HIP, VAC PLACEMENT;  Surgeon: Leandrew Koyanagi, MD;  Location: [place];  Service: Orthopedics;  Laterality: Left;   INCISION AND DRAINAGE HIP Left 01/19/2018   Procedure: IRRIGATION AND DEBRIDEMENT LEFT HIP;  Surgeon: Leandrew Koyanagi, MD;  Location: [place];  Service: Orthopedics;  Laterality: Left;   kidney stent Right 2002   TONSILLECTOMY Bilateral 12/06/2014   Procedure: TONSILLECTOMY, BILATERAL;  Surgeon: Ruby Cola, MD;  Location: [place];  Service: ENT;  Laterality: Bilateral;   TONSILLECTOMY     TOTAL HIP ARTHROPLASTY Left 06/01/2016   Procedure: LEFT TOTAL HIP ARTHROPLASTY ANTERIOR APPROACH;  Surgeon: Leandrew Koyanagi, MD;  Location: [place];  Service: Orthopedics;  Laterality: Left;   TUBAL  LIGATION     WOUND EXPLORATION Left 01/19/2018   Procedure: WOUND EXPLORATION;  Surgeon: Leandrew Koyanagi, MD;  Location: [place];  Service: Orthopedics;  Laterality: Left;   Patient Active Problem List   Diagnosis Date Noted   Urinary retention 12/13/2021   Postoperative anemia due to acute blood loss 12/10/2021   Presence of left artificial hip joint 12/10/2021   Diabetes mellitus type 2 in obese ([place]) 12/09/2021   Infection and inflammatory reaction due to other internal joint prosthesis, initial encounter ([place]) 12/09/2021   Preop examination 12/09/2021   HTN (hypertension) 12/08/2021   Arthritis 11/26/2021   Seizures ([place]) 11/26/2021   Rash 09/18/2020   Worsening headaches 01/21/2020   Pruritus 10/30/2019   Hypercalcemia 10/24/2019   Body mass index 45.0-49.9, adult ([place]) 08/27/2019   Intertrigo  07/09/2019   Facial rash 07/27/2018   Body mass index 40.0-44.9, adult ([place]) 02/15/2018   Fatigue associated with anemia 02/07/2018   Unspecified open wound, left hip, initial encounter 01/19/2018   Wound of left leg, sequela 10/03/2016   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[place]) 08/24/2016   Status post left hip replacement 06/01/2016   Sinus tachycardia 05/19/2016   History of adenomatous polyp of colon 03/21/2016   Diabetes ([place]) 01/21/2016   Thyroid nodule 12/25/2014   Infiltrate noted on imaging study    Cervical spine arthritis 11/14/2014   Primary osteoarthritis of left hip 11/14/2014   Loss of consciousness ([place]) 11/14/2014   Chest pain 03/26/2014   Musculoskeletal chest and rib pain 03/26/2014   Rib pain on right side 02/28/2014   Groin pain 04/18/2013   UNSPECIFIED RENAL SCLEROSIS 01/16/2009   GERD, SEVERE 05/25/2007   Morbid obesity ([place]) 04/03/2007   Dyslipidemia 08/31/2006   Major depression, recurrent ([place]) 08/31/2006   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   Former smoker 08/31/2006   HYPERTENSION, BENIGN SYSTEMIC 08/31/2006   GASTRIC ULCER ACUTE WITHOUT HEMORRHAGE 08/31/2006   CONVULSIONS, SEIZURES, NOS 08/31/2006    REFERRING DIAG: [id number] (ICD-10-CM) - Presence of left artificial hip joint  THERAPY DIAG: Presence of left artificial hip joint   Rationale for Evaluation and Treatment Rehabilitation  PERTINENT HISTORY: Patient ID: Kristin [name] is a 67 y.o. female.  Chief Complaint: Follow-up of the Left Hip  Months out from reimplantation over left total hip for chronic periprosthetic joint infection. Otherwise she is doing well. She rates her pain as a 3/10. She has been partial weight-bearing. She has had some accumulation of her seroma since removal of her drains. She has been off all of her antibiotics. Cultures were negative at the time of surgery   PRECAUTIONS:  posterior hip precautions   WEIGHT BEARING RESTRICTIONS: Yes WBAT      SUBJECTIVE:  SUBJECTIVE STATEMENT:   Both hips are hurting. I would like to continue PT. I have done some of the exercises. I have a cyst like sac on my left side that might burst so I canceled the aquatic appts. The surgeon does not want to drain it. He thinks it will cause more infection.    PAIN:  Are you having pain? Yes: NPRS scale: 5/10 left, right hip 2-3/10 Pain location: L hip Pain description: ache Aggravating factors: walking Relieving factors: rest and meds   OBJECTIVE: (objective measures completed at initial evaluation unless otherwise dated)   DIAGNOSTIC FINDINGS: none noted   PATIENT SURVEYS:  FOTO 47(51 predicted) FOTO 42 (09/19/22)   COGNITION: Overall cognitive status: Within functional limits for tasks assessed                         SENSATION: Not tested   MUSCLE LENGTH: Hamstrings: Right 80 deg; Left 80 deg   POSTURE:  not assessed   PALPATION: deferred   LOWER EXTREMITY ROM:   Active ROM Right eval Left eval   Hip flexion 110 85   Hip extension       Hip abduction       Hip adduction       Hip internal rotation       Hip external rotation       Knee flexion [facility] WFL   Knee extension [facility]   Ankle dorsiflexion       Ankle plantarflexion       Ankle inversion       Ankle eversion        (Blank rows = not tested)   LOWER EXTREMITY MMT:   MMT Right eval Left eval  Hip flexion   4-  Hip extension   4-  Hip abduction   4-  Hip adduction      Hip internal rotation      Hip external rotation      Knee flexion   4-  Knee extension   4-  Ankle dorsiflexion      Ankle plantarflexion   4-  Ankle inversion      Ankle eversion       (Blank rows = not tested)   LOWER EXTREMITY SPECIAL TESTS:  Hip special tests: ITB negative    FUNCTIONAL TESTS: 30s chair stand 6 reps  arms crossed 09/19/22: 2 MWT 215 feet  ( 1 standing rest break)      GAIT: Distance walked: 50ft  Assistive device utilized: Environmental consultant - 2 wheeled Level of assistance: SBA Comments: slow cadence     TODAY'S TREATMENT:   [facility] Adult PT Treatment:                                                DATE: 09/19/22 Therapeutic Exercise: Nustep L4 8 min Supine  hamstring stretch 30s x2 Bridge 15x2  Supine march alt. 15/15 x 2 Left SLR x 3 -min lift, used strap x 5 for assist  Supine hip fallouts Bil GTB 15x ea S/L clams Left AROM- 2x15 S/L reverse Left clam 2 x 15 2 MWT 215 feet  ( 1 standing rest break)    [facility] Adult PT Treatment:  DATE: 08-24-22 Aquatic therapy at [address] - therapeutic pool temp 92 degrees Pt enters building independently but needed W/C to transport to area and needed to be wheeled to handrails of pool.    Treatment took place in water 3.8 to  4 ft 8 in.feet deep depending upon activity.  Pt entered and exited the pool via stair and handrails   Pain level 5/10 at beginning of session.  At end 3/10 Aquatic Therapy:  Water walking for warm up fwd/lat with aqua jogger  Standing:all exercise using aquatic fins for added resistance  Standing march - 3x10 ea Hamstring curl 2x10 ea BIL Hip abd/add 2x10 BIL Hip Circles CC/CCW 2x10 each BIL Heel raises - 3x20 Hip ext/flex with knee straight x 20 BIL Mini squats x20 3 way hip kicks on RT and on LT 15 x  Sitting on bench in water: no aquatic fins Bicycle kicks x1' Reverse bicycle kicks x1' Flutter kicks x1' Scissor kicks x1' Seated dumbell push downs - rainbow - 2x10 Aqua stretch over LT hip /scar tissue massage over distended tissue/liquid pouch Ended session with water ambulation forward and backward without AD or aquatic flotation and increased stride length with aquatic fins bil Pt requires the buoyancy of water for active assisted exercises with  buoyancy supported for strengthening and AROM exercises. Hydrostatic pressure also supports joints by unweighting joint load by at least 50 % in 3-4 feet depth water. 80% in chest to neck deep water. Water will provide assistance with movement using the current and laminar flow while the buoyancy reduces weight bearing. Pt requires the viscosity of the water for resistance with strengthening exercises.    [facility] Adult PT Treatment:                                                DATE: 08/22/22 Therapeutic Exercise: Nustep L4 8 min Seated hamstring stretch 30s x2 Bridge with ball 15x2  Supine march alt. 15/15 x2 2# Supine hip fallouts Bil GTB 15x ea S/L clams L GTB 15x2 L hip flexor stretch over table 30s x2 FAQs with adduction 15x2 2# WS onto 6 in block with OH in walker 15/15 emphasize upright posture.         PATIENT EDUCATION:  Education details: Discussed eval findings, rehab rationale and POC and patient is in agreement  Person educated: Patient Education method: Explanation Education comprehension: verbalized understanding and needs further education   HOME EXERCISE PROGRAM: Access Code: [email or web address] URL: [email or web address] Date: 08/01/2022 Prepared by: Sharlynn Oliphant   Exercises - Sit to Stand Without Arm Support  - 2 x daily - 5 x weekly - 1 sets - 5 reps - Supine Bridge  - 2 x daily - 5 x weekly - 1 sets - 10 reps - Standing Heel Raise with Support  - 2 x daily - 5 x weekly - 1 sets - 10 reps   ASSESSMENT:   CLINICAL IMPRESSION: Pt enters clinic with RW.  Pt 5/10 at beginning of session. She reports fluid filled sack at lateral hip was concerning her with getting in the pool so she canceled appts until she saw MD. He does not recommend to drain the site at this time. Pt would like to resume land based physical therapy. She has reached the end of her POC and was scheduled today for next available re-evaluation. Her  FOTO score has worsened slightly. Her gait  distance has improved to 215 feet prior to rest break. She has met STG #2.    OBJECTIVE IMPAIRMENTS: Abnormal gait, decreased activity tolerance, decreased balance, decreased endurance, decreased mobility, difficulty walking, decreased ROM, decreased strength, impaired perceived functional ability, and pain.    ACTIVITY LIMITATIONS: carrying, lifting, sitting, standing, squatting, sleeping, and stairs   PERSONAL FACTORS: Fitness, Past/current experiences, Time since onset of injury/illness/exacerbation, and 1 comorbidity: DM  are also affecting patient's functional outcome.    REHAB POTENTIAL: Good   CLINICAL DECISION MAKING: Evolving/moderate complexity   EVALUATION COMPLEXITY: Low     GOALS: Goals reviewed with patient? Yes   SHORT TERM GOALS: Target date: 08/22/2022   Patient to demonstrate independence in HEP Baseline:[id number] Goal status: Met   2.  261ft ambulation with RW and SBA Baseline: 105ft with RW and SBA 09/19/22: 215 feet Mod I Goal status: MET      LONG TERM GOALS: Target date: 09/12/2022     Increase FOTO score to 51 Baseline: 47 09/19/22: 42 Goal status: ONGOING   2.  Decrease worst pain to 3/10 Baseline: 7/10 09/19/22: 5/10 Goal status: ONGOING   3.  Increase RLE strength to 4/5 Baseline:  MMT Right eval Left eval  Hip flexion   4-  Hip extension   4-  Hip abduction   4-  Hip adduction      Hip internal rotation      Hip external rotation      Knee flexion   4-  Knee extension   4-  Ankle dorsiflexion      Ankle plantarflexion   4-    Goal status: ONGOING   4.  Patient to ambulate 200 ft with LRAD Baseline: 13ft with RW 09/19/22: 215 ft with RW  Goal status: ONGOING        PLAN:   PT FREQUENCY: 2x/week   PT DURATION: 6 weeks   PLANNED INTERVENTIONS: Therapeutic exercises, Therapeutic activity, Neuromuscular re-education, Balance training, Gait training, Patient/Family education, Self Care, Joint mobilization, Stair training, DME  instructions, Aquatic Therapy, and Re-evaluation   PLAN FOR NEXT SESSION: RE-eval: HEP review and update, aerobic tasks, LE strengthening, gait training, balance/proprioceptive work      [employer], PTA 09/19/22 11:08 AM Phone: [phone] Fax: [phone]

## 2022-09-28 ENCOUNTER — Encounter: Payer: Self-pay | Admitting: Physical Therapy

## 2022-09-28 ENCOUNTER — Ambulatory Visit: Payer: 59 | Admitting: Physical Therapy

## 2022-09-28 DIAGNOSIS — Z96649 Presence of unspecified artificial hip joint: Secondary | ICD-10-CM

## 2022-09-28 DIAGNOSIS — M25552 Pain in left hip: Secondary | ICD-10-CM

## 2022-09-28 DIAGNOSIS — M6281 Muscle weakness (generalized): Secondary | ICD-10-CM

## 2022-09-28 NOTE — Therapy (Signed)
Progress Note Reporting Period 1/29 to 3/18  See note below for Objective Data and Assessment of Progress/Goals.      Kristin Name: Kristin [name] MRN: [id number] DOB:[birth year], 67 y.o., female Today's Date: 09/28/2022  PCP: Erskine Emery, MD    REFERRING PROVIDER: Elsie Ra, MD  END OF SESSION:   PT End of Session - 09/28/22 1257     Visit Number 10    Number of Visits --   1-2x/week   Date for PT Re-Evaluation 11/23/22    Authorization Type [employer] MCR    PT Start Time 1300    PT Stop Time 1340    PT Time Calculation (min) 40 min               Past Medical History:  Diagnosis Date   Allergy    seasonal   Anemia    Anxiety    Arthritis    Back pain, chronic    Borderline diabetes    Bronchitis    Constipation    current issue   Cough    Depression    Epilepsy ([place])    GERD (gastroesophageal reflux disease)    Hyperlipidemia    Hypertension    IBS (irritable bowel syndrome)    Joint pain    Obesity    Palpitations    Pneumonia 2016   Pre-diabetes    Renal sclerosis, unspecified    only has 1 kidney   Rheumatoid arthritis ([place])    Seizures ([place])    last >15 +  yrs ago- recorded 07-26-2021   Somatization disorder    Tobacco abuse    Past Surgical History:  Procedure Laterality Date   ABDOMINAL HYSTERECTOMY     ANTERIOR HIP REVISION Left 07/25/2016   Procedure: LEFT HIP IRRIGATION AND DEBRIDEMENT, REVISION OF HEAD AND LINER;  Surgeon: Leandrew Koyanagi, MD;  Location: [place];  Service: Orthopedics;  Laterality: Left;   APPLICATION OF WOUND VAC Left 12/23/2019   Procedure: APPLICATION OF WOUND VAC;  Surgeon: Leandrew Koyanagi, MD;  Location: [place];  Service: Orthopedics;  Laterality: Left;   BREAST EXCISIONAL BIOPSY Right    CHOLECYSTECTOMY     COLONOSCOPY     DEBRIDEMENT AND CLOSURE WOUND Left 07/17/2019   Procedure: Excision of left leg wound with ACell placement and primary closure;  Surgeon: Wallace Going, DO;  Location: [place];  Service: Plastics;  Laterality: Left;  60 min   EXCISIONAL TOTAL HIP ARTHROPLASTY WITH ANTIBIOTIC SPACERS Left 12/23/2019   Procedure: LEFT HIP IRRIGATION AND DEBRIDEMENT LEFT HIP WOUND and poly exchange ;  Surgeon: Leandrew Koyanagi, MD;  Location: [place];  Service: Orthopedics;  Laterality: Left;   HIP DEBRIDEMENT Left 01/19/2018   Procedure(s) Performed: IRRIGATION AND DEBRIDEMENT LEFT HIP (Left )   INCISION AND DRAINAGE HIP Left 08/03/2016   Procedure: IRRIGATION AND DEBRIDEMENT LEFT HIP, VAC PLACEMENT;  Surgeon: Leandrew Koyanagi, MD;  Location: [place];  Service: Orthopedics;  Laterality: Left;   INCISION AND DRAINAGE HIP Left 01/19/2018   Procedure: IRRIGATION AND DEBRIDEMENT LEFT HIP;  Surgeon: Leandrew Koyanagi, MD;  Location: [place];  Service: Orthopedics;  Laterality: Left;   kidney stent Right 2002   TONSILLECTOMY Bilateral 12/06/2014   Procedure: TONSILLECTOMY, BILATERAL;  Surgeon: Ruby Cola, MD;  Location: [place];  Service: ENT;  Laterality: Bilateral;   TONSILLECTOMY     TOTAL HIP ARTHROPLASTY Left 06/01/2016   Procedure: LEFT TOTAL HIP ARTHROPLASTY ANTERIOR APPROACH;  Surgeon: Leandrew Koyanagi, MD;  Location: [place];  Service: Orthopedics;  Laterality: Left;   TUBAL LIGATION     WOUND EXPLORATION Left 01/19/2018   Procedure: WOUND EXPLORATION;  Surgeon: Leandrew Koyanagi, MD;  Location: [place];  Service: Orthopedics;  Laterality: Left;   Kristin Active Problem List   Diagnosis Date Noted   Urinary retention 12/13/2021   Postoperative anemia due to acute blood loss 12/10/2021   Presence of left artificial hip joint 12/10/2021   Diabetes mellitus type 2 in obese ([place]) 12/09/2021   Infection and inflammatory reaction due to other internal joint prosthesis, initial encounter ([place]) 12/09/2021   Preop examination 12/09/2021   HTN (hypertension) 12/08/2021   Arthritis 11/26/2021   Seizures ([place]) 11/26/2021   Rash 09/18/2020   Worsening headaches 01/21/2020   Pruritus 10/30/2019    Hypercalcemia 10/24/2019   Body mass index 45.0-49.9, adult ([place]) 08/27/2019   Intertrigo 07/09/2019   Facial rash 07/27/2018   Body mass index 40.0-44.9, adult ([place]) 02/15/2018   Fatigue associated with anemia 02/07/2018   Unspecified open wound, left hip, initial encounter 01/19/2018   Wound of left leg, sequela 10/03/2016   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[place]) 08/24/2016   Status post left hip replacement 06/01/2016   Sinus tachycardia 05/19/2016   History of adenomatous polyp of colon 03/21/2016   Diabetes ([place]) 01/21/2016   Thyroid nodule 12/25/2014   Infiltrate noted on imaging study    Cervical spine arthritis 11/14/2014   Primary osteoarthritis of left hip 11/14/2014   Loss of consciousness ([place]) 11/14/2014   Chest pain 03/26/2014   Musculoskeletal chest and rib pain 03/26/2014   Rib pain on right side 02/28/2014   Groin pain 04/18/2013   UNSPECIFIED RENAL SCLEROSIS 01/16/2009   GERD, SEVERE 05/25/2007   Morbid obesity ([place]) 04/03/2007   Dyslipidemia 08/31/2006   Major depression, recurrent ([place]) 08/31/2006   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   Former smoker 08/31/2006   HYPERTENSION, BENIGN SYSTEMIC 08/31/2006   GASTRIC ULCER ACUTE WITHOUT HEMORRHAGE 08/31/2006   CONVULSIONS, SEIZURES, NOS 08/31/2006    REFERRING DIAG: [id number] (ICD-10-CM) - Presence of left artificial hip joint  THERAPY DIAG: Presence of left artificial hip joint   Rationale for Evaluation and Treatment Rehabilitation  PERTINENT HISTORY: Kristin [name] is a 67 y.o. female.  Chief Complaint: Follow-up of the Left Hip  Months out from reimplantation over left total hip for chronic periprosthetic joint infection. Otherwise she is doing well. She rates her pain as a 3/10. She has been partial weight-bearing. She has had some accumulation of her seroma since removal of her drains. She has been off all of her antibiotics. Cultures were negative at the time of surgery    PRECAUTIONS:  posterior hip precautions   WEIGHT BEARING RESTRICTIONS: Yes WBAT      SUBJECTIVE:  SUBJECTIVE STATEMENT:   Pt reports that she feels PT has been beneficial.  She has been able to transfer from w/c to [facility] walker.  She feels her leg and hip have gotten much stronger.   PAIN:  Are you having pain? Yes: NPRS scale: 5/10 left, right hip 2-3/10 Pain location: L hip Pain description: ache Aggravating factors: walking Relieving factors: rest and meds   OBJECTIVE: (objective measures completed at initial evaluation unless otherwise dated)   DIAGNOSTIC FINDINGS: none noted   Kristin SURVEYS:  FOTO 47(51 predicted) FOTO 42 (09/19/22)   COGNITION: Overall cognitive status: Within functional limits for tasks assessed                         SENSATION: Not tested   MUSCLE LENGTH: Hamstrings: Right 80 deg; Left 80 deg   POSTURE:  not assessed   PALPATION: deferred   LOWER EXTREMITY ROM:   Active ROM Right eval Left eval   Hip flexion 110 85   Hip extension       Hip abduction       Hip adduction       Hip internal rotation       Hip external rotation       Knee flexion [facility] WFL   Knee extension [facility]   Ankle dorsiflexion       Ankle plantarflexion       Ankle inversion       Ankle eversion        (Blank rows = not tested)   LOWER EXTREMITY MMT:   MMT Right eval Left eval  Hip flexion   4-  Hip extension   4-  Hip abduction   4-  Hip adduction      Hip internal rotation      Hip external rotation      Knee flexion   4-  Knee extension   4-  Ankle dorsiflexion      Ankle plantarflexion   4-  Ankle inversion      Ankle eversion       (Blank rows = not tested)   LOWER EXTREMITY SPECIAL TESTS:  Hip special tests: ITB negative    FUNCTIONAL TESTS: 30s chair  stand 6 reps arms crossed 09/19/22: 2 MWT 215 feet  ( 1 standing rest break)      GAIT: Distance walked: 64ft  Assistive device utilized: Environmental consultant - 2 wheeled Level of assistance: SBA Comments: slow cadence     TODAY'S TREATMENT:    [facility] Adult PT Treatment:                                                DATE: 09/28/22 Therapeutic Exercise: Nustep L4 8 min Supine  hamstring stretch 30s x2 R SKTC with contralateral R hip flexor stretch Bridge 15x2  Supine march alt. 15/15 x 2 w/ YTB Left SLR 3x5 no assist Supine hip fallouts Bil GTB 2x10 S/L clams Left AROM- 2x10 small arc S/L reverse Left clam 2 x 15  Therapeutic Activity - collecting information for goals, checking progress, and reviewing with Kristin  [facility] Adult PT Treatment:  DATE: 09/19/22 Therapeutic Exercise: Nustep L4 8 min Supine  hamstring stretch 30s x2 Bridge 15x2  Supine march alt. 15/15 x 2 Left SLR x 3 -min lift, used strap x 5 for assist  Supine hip fallouts Bil GTB 15x ea S/L clams Left AROM- 2x15 S/L reverse Left clam 2 x 15 2 MWT 215 feet  ( 1 standing rest break)    [facility] Adult PT Treatment:                                                DATE: 08-24-22 Aquatic therapy at [address] - therapeutic pool temp 92 degrees Pt enters building independently but needed W/C to transport to area and needed to be wheeled to handrails of pool.    Treatment took place in water 3.8 to  4 ft 8 in.feet deep depending upon activity.  Pt entered and exited the pool via stair and handrails   Pain level 5/10 at beginning of session.  At end 3/10 Aquatic Therapy:  Water walking for warm up fwd/lat with aqua jogger  Standing:all exercise using aquatic fins for added resistance  Standing march - 3x10 ea Hamstring curl 2x10 ea BIL Hip abd/add 2x10 BIL Hip Circles CC/CCW 2x10 each BIL Heel raises - 3x20 Hip ext/flex with knee straight x 20 BIL Mini squats x20 3  way hip kicks on RT and on LT 15 x  Sitting on bench in water: no aquatic fins Bicycle kicks x1' Reverse bicycle kicks x1' Flutter kicks x1' Scissor kicks x1' Seated dumbell push downs - rainbow - 2x10 Aqua stretch over LT hip /scar tissue massage over distended tissue/liquid pouch Ended session with water ambulation forward and backward without AD or aquatic flotation and increased stride length with aquatic fins bil Pt requires the buoyancy of water for active assisted exercises with buoyancy supported for strengthening and AROM exercises. Hydrostatic pressure also supports joints by unweighting joint load by at least 50 % in 3-4 feet depth water. 80% in chest to neck deep water. Water will provide assistance with movement using the current and laminar flow while the buoyancy reduces weight bearing. Pt requires the viscosity of the water for resistance with strengthening exercises.    [facility] Adult PT Treatment:                                                DATE: 08/22/22 Therapeutic Exercise: Nustep L4 8 min Seated hamstring stretch 30s x2 Bridge with ball 15x2  Supine march alt. 15/15 x2 2# Supine hip fallouts Bil GTB 15x ea S/L clams L GTB 15x2 L hip flexor stretch over table 30s x2 FAQs with adduction 15x2 2# WS onto 6 in block with OH in walker 15/15 emphasize upright posture.         Kristin EDUCATION:  Education details: Discussed eval findings, rehab rationale and POC and Kristin is in agreement  Person educated: Kristin Education method: Explanation Education comprehension: verbalized understanding and needs further education   HOME EXERCISE PROGRAM: Access Code: [email or web address] URL: [email or web address] Date: 08/01/2022 Prepared by: Sharlynn Oliphant   Exercises - Sit to Stand Without Arm Support  - 2 x daily - 5 x weekly - 1 sets - 5 reps -  Supine Bridge  - 2 x daily - 5 x weekly - 1 sets - 10 reps - Standing Heel Raise with Support  - 2 x daily - 5 x  weekly - 1 sets - 10 reps   ASSESSMENT:   CLINICAL IMPRESSION:   Kristin [name] has progressed well with therapy.  Improved impairments include: gait speed, LE strength, pain.  Functional improvements include: transfers, ability to ambulate with FWW vs w/c, ability to do short shopping trips.  Progressions needed include: continued work on hip ROM, strength, gait, balance.  Barriers to progress include: complications d/t infection.  Please see GOALS section for progress on short term and long term goals established at evaluation.  I recommend continuation of PT to allow completion of remaining goals and continued functional progression.   OBJECTIVE IMPAIRMENTS: Abnormal gait, decreased activity tolerance, decreased balance, decreased endurance, decreased mobility, difficulty walking, decreased ROM, decreased strength, impaired perceived functional ability, and pain.    ACTIVITY LIMITATIONS: carrying, lifting, sitting, standing, squatting, sleeping, and stairs   PERSONAL FACTORS: Fitness, Past/current experiences, Time since onset of injury/illness/exacerbation, and 1 comorbidity: DM  are also affecting Kristin's functional outcome.    REHAB POTENTIAL: Good   CLINICAL DECISION MAKING: Evolving/moderate complexity   EVALUATION COMPLEXITY: Low     GOALS: Goals reviewed with Kristin? Yes   SHORT TERM GOALS: Target date: 08/22/2022   Kristin to demonstrate independence in HEP Baseline:[id number] Goal status: Met   2.  284ft ambulation with RW and SBA Baseline: 49ft with RW and SBA 09/19/22: 215 feet Mod I Goal status: MET      LONG TERM GOALS: Target date: 09/12/2022 (extended to 11/23/2022)     Increase FOTO score to 51 Baseline: 47 09/19/22: 42 Goal status: ONGOING   2.  Decrease worst pain to 3/10 Baseline: 7/10 09/19/22: 5/10 Goal status: ONGOING   3.  Increase RLE strength to 4/5 Baseline:  MMT Right eval Left eval 3/27  Hip flexion   4- 4  Hip extension   4-   Hip abduction    4-   Hip adduction       Hip internal rotation       Hip external rotation       Knee flexion   4- 4  Knee extension   4- 4  Ankle dorsiflexion       Ankle plantarflexion   4- 4    Goal status: ONGOING   4.  Kristin to ambulate 200 ft with LRAD Baseline: 15ft with RW 09/19/22: 215 ft with RW  Goal status: MET  5. Kristin to ambulate 275 ft with LRAD in 2 MWT Baseline: 74ft with RW 09/19/22: 215 ft with RW  Goal status: *NEW*       PLAN:   PT FREQUENCY: 2x/week   PT DURATION: 8 weeks (11/23/2022)   PLANNED INTERVENTIONS: Therapeutic exercises, Therapeutic activity, Neuromuscular re-education, Balance training, Gait training, Kristin/Family education, Self Care, Joint mobilization, Stair training, DME instructions, Aquatic Therapy, and Re-evaluation   PLAN FOR NEXT SESSION: RE-eval: HEP review and update, aerobic tasks, LE strengthening, gait training, balance/proprioceptive work      Mathis Dad PT 09/28/22 1:45 PM Phone: [phone] Fax: [phone]

## 2022-10-03 NOTE — Therapy (Unsigned)
Progress Note Reporting Period 1/29 to 3/18  See note below for Objective Data and Assessment of Progress/Goals.      Patient Name: Kristin [name] MRN: [id number] DOB:1955/10/29, 67 y.o., female Today's Date: 10/04/2022  PCP: Erskine Emery, MD    REFERRING PROVIDER: Elsie Ra, MD  END OF SESSION:   PT End of Session - 10/04/22 1121     Visit Number 11    Date for PT Re-Evaluation 11/23/22    Authorization Type [employer] MCR    PT Start Time 1130    PT Stop Time 1210    PT Time Calculation (min) 40 min    Activity Tolerance Patient tolerated treatment well;Patient limited by fatigue    Behavior During Therapy WFL for tasks assessed/performed               Past Medical History:  Diagnosis Date   Allergy    seasonal   Anemia    Anxiety    Arthritis    Back pain, chronic    Borderline diabetes    Bronchitis    Constipation    current issue   Cough    Depression    Epilepsy    GERD (gastroesophageal reflux disease)    Hyperlipidemia    Hypertension    IBS (irritable bowel syndrome)    Joint pain    Obesity    Palpitations    Pneumonia 2016   Pre-diabetes    Renal sclerosis, unspecified    only has 1 kidney   Rheumatoid arthritis    Seizures    last >15 +  yrs ago- recorded 07-26-2021   Somatization disorder    Tobacco abuse    Past Surgical History:  Procedure Laterality Date   ABDOMINAL HYSTERECTOMY     ANTERIOR HIP REVISION Left 07/25/2016   Procedure: LEFT HIP IRRIGATION AND DEBRIDEMENT, REVISION OF HEAD AND LINER;  Surgeon: Leandrew Koyanagi, MD;  Location: [place];  Service: Orthopedics;  Laterality: Left;   APPLICATION OF WOUND VAC Left 12/23/2019   Procedure: APPLICATION OF WOUND VAC;  Surgeon: Leandrew Koyanagi, MD;  Location: [place];  Service: Orthopedics;  Laterality: Left;   BREAST EXCISIONAL BIOPSY Right    CHOLECYSTECTOMY     COLONOSCOPY     DEBRIDEMENT AND CLOSURE WOUND Left 07/17/2019   Procedure: Excision of left leg wound with ACell  placement and primary closure;  Surgeon: Wallace Going, DO;  Location: [place];  Service: Plastics;  Laterality: Left;  60 min   EXCISIONAL TOTAL HIP ARTHROPLASTY WITH ANTIBIOTIC SPACERS Left 12/23/2019   Procedure: LEFT HIP IRRIGATION AND DEBRIDEMENT LEFT HIP WOUND and poly exchange ;  Surgeon: Leandrew Koyanagi, MD;  Location: [place];  Service: Orthopedics;  Laterality: Left;   HIP DEBRIDEMENT Left 01/19/2018   Procedure(s) Performed: IRRIGATION AND DEBRIDEMENT LEFT HIP (Left )   INCISION AND DRAINAGE HIP Left 08/03/2016   Procedure: IRRIGATION AND DEBRIDEMENT LEFT HIP, VAC PLACEMENT;  Surgeon: Leandrew Koyanagi, MD;  Location: [place];  Service: Orthopedics;  Laterality: Left;   INCISION AND DRAINAGE HIP Left 01/19/2018   Procedure: IRRIGATION AND DEBRIDEMENT LEFT HIP;  Surgeon: Leandrew Koyanagi, MD;  Location: [place];  Service: Orthopedics;  Laterality: Left;   kidney stent Right 2002   TONSILLECTOMY Bilateral 12/06/2014   Procedure: TONSILLECTOMY, BILATERAL;  Surgeon: Ruby Cola, MD;  Location: [place];  Service: ENT;  Laterality: Bilateral;   TONSILLECTOMY     TOTAL HIP ARTHROPLASTY Left 06/01/2016  Procedure: LEFT TOTAL HIP ARTHROPLASTY ANTERIOR APPROACH;  Surgeon: Leandrew Koyanagi, MD;  Location: [place];  Service: Orthopedics;  Laterality: Left;   TUBAL LIGATION     WOUND EXPLORATION Left 01/19/2018   Procedure: WOUND EXPLORATION;  Surgeon: Leandrew Koyanagi, MD;  Location: [place];  Service: Orthopedics;  Laterality: Left;   Patient Active Problem List   Diagnosis Date Noted   Urinary retention 12/13/2021   Postoperative anemia due to acute blood loss 12/10/2021   Presence of left artificial hip joint 12/10/2021   Diabetes mellitus type 2 in obese 12/09/2021   Infection and inflammatory reaction due to other internal joint prosthesis, initial encounter 12/09/2021   Preop examination 12/09/2021   HTN (hypertension) 12/08/2021   Arthritis 11/26/2021   Seizures 11/26/2021   Rash  09/18/2020   Worsening headaches 01/21/2020   Pruritus 10/30/2019   Hypercalcemia 10/24/2019   Body mass index 45.0-49.9, adult 08/27/2019   Intertrigo 07/09/2019   Facial rash 07/27/2018   Body mass index 40.0-44.9, adult 02/15/2018   Fatigue associated with anemia 02/07/2018   Unspecified open wound, left hip, initial encounter 01/19/2018   Wound of left leg, sequela 10/03/2016   Prosthetic joint infection of left hip 08/24/2016   Status post left hip replacement 06/01/2016   Sinus tachycardia 05/19/2016   History of adenomatous polyp of colon 03/21/2016   Diabetes 01/21/2016   Thyroid nodule 12/25/2014   Infiltrate noted on imaging study    Cervical spine arthritis 11/14/2014   Primary osteoarthritis of left hip 11/14/2014   Loss of consciousness 11/14/2014   Chest pain 03/26/2014   Musculoskeletal chest and rib pain 03/26/2014   Rib pain on right side 02/28/2014   Groin pain 04/18/2013   UNSPECIFIED RENAL SCLEROSIS 01/16/2009   GERD, SEVERE 05/25/2007   Morbid obesity ([place]) 04/03/2007   Dyslipidemia 08/31/2006   Major depression, recurrent 08/31/2006   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   Former smoker 08/31/2006   HYPERTENSION, BENIGN SYSTEMIC 08/31/2006   GASTRIC ULCER ACUTE WITHOUT HEMORRHAGE 08/31/2006   CONVULSIONS, SEIZURES, NOS 08/31/2006    REFERRING DIAG: [id number] (ICD-10-CM) - Presence of left artificial hip joint  THERAPY DIAG: Presence of left artificial hip joint   Rationale for Evaluation and Treatment Rehabilitation  PERTINENT HISTORY: Patient ID: [name] is a 67 y.o. female.  Chief Complaint: Follow-up of the Left Hip  Months out from reimplantation over left total hip for chronic periprosthetic joint infection. Otherwise she is doing well. She rates her pain as a 3/10. She has been partial weight-bearing. She has had some accumulation of her seroma since removal of her drains. She has been off all of her antibiotics. Cultures were negative  at the time of surgery   PRECAUTIONS:  posterior hip precautions   WEIGHT BEARING RESTRICTIONS: Yes WBAT      SUBJECTIVE:  SUBJECTIVE STATEMENT:  patient reports 3/10 hip pain   PAIN:  Are you having pain? Yes: NPRS scale: 5/10 left, right hip 2-3/10 Pain location: L hip Pain description: ache Aggravating factors: walking Relieving factors: rest and meds   OBJECTIVE: (objective measures completed at initial evaluation unless otherwise dated)   DIAGNOSTIC FINDINGS: none noted   PATIENT SURVEYS:  FOTO 47(51 predicted) FOTO 42 (09/19/22)   COGNITION: Overall cognitive status: Within functional limits for tasks assessed                         SENSATION: Not tested   MUSCLE LENGTH: Hamstrings: Right 80 deg; Left 80 deg   POSTURE:  not assessed   PALPATION: deferred   LOWER EXTREMITY ROM:   Active ROM Right eval Left eval   Hip flexion 110 85   Hip extension       Hip abduction       Hip adduction       Hip internal rotation       Hip external rotation       Knee flexion [facility] WFL   Knee extension [facility]   Ankle dorsiflexion       Ankle plantarflexion       Ankle inversion       Ankle eversion        (Blank rows = not tested)   LOWER EXTREMITY MMT:   MMT Right eval Left eval  Hip flexion   4-  Hip extension   4-  Hip abduction   4-  Hip adduction      Hip internal rotation      Hip external rotation      Knee flexion   4-  Knee extension   4-  Ankle dorsiflexion      Ankle plantarflexion   4-  Ankle inversion      Ankle eversion       (Blank rows = not tested)   LOWER EXTREMITY SPECIAL TESTS:  Hip special tests: ITB negative    FUNCTIONAL TESTS: 30s chair stand 6 reps arms crossed 09/19/22: 2 MWT 215 feet  ( 1 standing rest break)      GAIT: Distance walked:  61ft  Assistive device utilized: Environmental consultant - 2 wheeled Level of assistance: SBA Comments: slow cadence     TODAY'S TREATMENT:   [facility] Adult PT Treatment:                                                DATE: 10/04/22 Therapeutic Exercise: Nustep L5 8 min Weight shift onto 4 in block with OH reach 15/15 to promote SLS and wean off walker L FAQs 5# 15x2 L hip flexor stretch with bolster 60s x2 Supine hip fallouts GTB 15x B, 15/15 unilateral. Bridge with feet on 1/2 roll 15x PPT 10x 3s hold PPT with alternating march 15/15 PF against wall 15x Mountain climbers on wall 15/15 with PT stabilizing L hip due to weakness with SLS   [facility] Adult PT Treatment:                                                DATE: 09/28/22 Therapeutic Exercise: Nustep L4 8 min  Supine  hamstring stretch 30s x2 R SKTC with contralateral R hip flexor stretch Bridge 15x2  Supine march alt. 15/15 x 2 w/ YTB Left SLR 3x5 no assist Supine hip fallouts Bil GTB 2x10 S/L clams Left AROM- 2x10 small arc S/L reverse Left clam 2 x 15  Therapeutic Activity - collecting information for goals, checking progress, and reviewing with patient  [facility] Adult PT Treatment:                                                DATE: 09/19/22 Therapeutic Exercise: Nustep L4 8 min Supine  hamstring stretch 30s x2 Bridge 15x2  Supine march alt. 15/15 x 2 Left SLR x 3 -min lift, used strap x 5 for assist  Supine hip fallouts Bil GTB 15x ea S/L clams Left AROM- 2x15 S/L reverse Left clam 2 x 15 2 MWT 215 feet  ( 1 standing rest break)    [facility] Adult PT Treatment:                                                DATE: 08-24-22 Aquatic therapy at [address] - therapeutic pool temp 92 degrees Pt enters building independently but needed W/C to transport to area and needed to be wheeled to handrails of pool.    Treatment took place in water 3.8 to  4 ft 8 in.feet deep depending upon activity.  Pt entered and exited the pool via  stair and handrails   Pain level 5/10 at beginning of session.  At end 3/10 Aquatic Therapy:  Water walking for warm up fwd/lat with aqua jogger  Standing:all exercise using aquatic fins for added resistance  Standing march - 3x10 ea Hamstring curl 2x10 ea BIL Hip abd/add 2x10 BIL Hip Circles CC/CCW 2x10 each BIL Heel raises - 3x20 Hip ext/flex with knee straight x 20 BIL Mini squats x20 3 way hip kicks on RT and on LT 15 x  Sitting on bench in water: no aquatic fins Bicycle kicks x1' Reverse bicycle kicks x1' Flutter kicks x1' Scissor kicks x1' Seated dumbell push downs - rainbow - 2x10 Aqua stretch over LT hip /scar tissue massage over distended tissue/liquid pouch Ended session with water ambulation forward and backward without AD or aquatic flotation and increased stride length with aquatic fins bil Pt requires the buoyancy of water for active assisted exercises with buoyancy supported for strengthening and AROM exercises. Hydrostatic pressure also supports joints by unweighting joint load by at least 50 % in 3-4 feet depth water. 80% in chest to neck deep water. Water will provide assistance with movement using the current and laminar flow while the buoyancy reduces weight bearing. Pt requires the viscosity of the water for resistance with strengthening exercises.    [facility] Adult PT Treatment:                                                DATE: 08/22/22 Therapeutic Exercise: Nustep L4 8 min Seated hamstring stretch 30s x2 Bridge with ball 15x2  Supine march alt. 15/15 x2 2# Supine hip fallouts  Bil GTB 15x ea S/L clams L GTB 15x2 L hip flexor stretch over table 30s x2 FAQs with adduction 15x2 2# WS onto 6 in block with OH in walker 15/15 emphasize upright posture.         PATIENT EDUCATION:  Education details: Discussed eval findings, rehab rationale and POC and patient is in agreement  Person educated: Patient Education method: Explanation Education comprehension:  verbalized understanding and needs further education   HOME EXERCISE PROGRAM: Access Code: [email or web address] URL: [email or web address] Date: 08/01/2022 Prepared by: Sharlynn Oliphant   Exercises - Sit to Stand Without Arm Support  - 2 x daily - 5 x weekly - 1 sets - 5 reps - Supine Bridge  - 2 x daily - 5 x weekly - 1 sets - 10 reps - Standing Heel Raise with Support  - 2 x daily - 5 x weekly - 1 sets - 10 reps   ASSESSMENT:   CLINICAL IMPRESSION: Today's ession added additional weightbearing and closed chain tasks to program.  L hip abduction weakness evident during SLS tasks on L requiring PT to  physially stabilize L hip to allow patient to complete task.   OBJECTIVE IMPAIRMENTS: Abnormal gait, decreased activity tolerance, decreased balance, decreased endurance, decreased mobility, difficulty walking, decreased ROM, decreased strength, impaired perceived functional ability, and pain.    ACTIVITY LIMITATIONS: carrying, lifting, sitting, standing, squatting, sleeping, and stairs   PERSONAL FACTORS: Fitness, Past/current experiences, Time since onset of injury/illness/exacerbation, and 1 comorbidity: DM  are also affecting patient's functional outcome.    REHAB POTENTIAL: Good   CLINICAL DECISION MAKING: Evolving/moderate complexity   EVALUATION COMPLEXITY: Low     GOALS: Goals reviewed with patient? Yes   SHORT TERM GOALS: Target date: 08/22/2022   Patient to demonstrate independence in HEP Baseline:[id number] Goal status: Met   2.  294ft ambulation with RW and SBA Baseline: 35ft with RW and SBA 09/19/22: 215 feet Mod I Goal status: MET      LONG TERM GOALS: Target date: 09/12/2022 (extended to 11/23/2022)     Increase FOTO score to 51 Baseline: 47 09/19/22: 42 Goal status: ONGOING   2.  Decrease worst pain to 3/10 Baseline: 7/10 09/19/22: 5/10 Goal status: ONGOING   3.  Increase RLE strength to 4/5 Baseline:  MMT Right eval Left eval 3/27  Hip flexion    4- 4  Hip extension   4-   Hip abduction   4-   Hip adduction       Hip internal rotation       Hip external rotation       Knee flexion   4- 4  Knee extension   4- 4  Ankle dorsiflexion       Ankle plantarflexion   4- 4    Goal status: ONGOING   4.  Patient to ambulate 200 ft with LRAD Baseline: 70ft with RW 09/19/22: 215 ft with RW  Goal status: MET  5. Patient to ambulate 275 ft with LRAD in 2 MWT Baseline: 41ft with RW 09/19/22: 215 ft with RW  Goal status: *NEW*       PLAN:   PT FREQUENCY: 2x/week   PT DURATION: 8 weeks (11/23/2022)   PLANNED INTERVENTIONS: Therapeutic exercises, Therapeutic activity, Neuromuscular re-education, Balance training, Gait training, Patient/Family education, Self Care, Joint mobilization, Stair training, DME instructions, Aquatic Therapy, and Re-evaluation   PLAN FOR NEXT SESSION: RE-eval: HEP review and update, aerobic tasks, LE strengthening, gait training, balance/proprioceptive work  Jacqulynn Cadet Herb Beltre PT 10/04/22 12:14 PM Phone: [phone] Fax: [phone]

## 2022-10-04 ENCOUNTER — Ambulatory Visit: Payer: 59 | Attending: Orthopaedic Surgery

## 2022-10-04 DIAGNOSIS — M25552 Pain in left hip: Secondary | ICD-10-CM | POA: Insufficient documentation

## 2022-10-04 DIAGNOSIS — M6281 Muscle weakness (generalized): Secondary | ICD-10-CM | POA: Insufficient documentation

## 2022-10-04 DIAGNOSIS — Z96649 Presence of unspecified artificial hip joint: Secondary | ICD-10-CM | POA: Diagnosis present

## 2022-10-05 LAB — HM DIABETES EYE EXAM

## 2022-10-06 ENCOUNTER — Encounter: Payer: Self-pay | Admitting: Physical Therapy

## 2022-10-06 ENCOUNTER — Ambulatory Visit: Payer: 59 | Admitting: Physical Therapy

## 2022-10-06 DIAGNOSIS — Z96649 Presence of unspecified artificial hip joint: Secondary | ICD-10-CM | POA: Diagnosis not present

## 2022-10-06 DIAGNOSIS — M25552 Pain in left hip: Secondary | ICD-10-CM

## 2022-10-06 DIAGNOSIS — M6281 Muscle weakness (generalized): Secondary | ICD-10-CM

## 2022-10-06 NOTE — Therapy (Signed)
Daily Note     Patient Name: Kristin [name] MRN: [id number] DOB:[birth year], 67 y.o., female Today's Date: 10/06/2022  PCP: Erskine Emery, MD    REFERRING PROVIDER: Elsie Ra, MD  END OF SESSION:   Kristin End of Session - 10/06/22 0959     Visit Number 12    Date for Kristin Re-Evaluation 11/23/22    Authorization Type [employer] MCR    Kristin Start Time 1000    Kristin Stop Time [id number]    Kristin Time Calculation (min) 41 min    Activity Tolerance Patient tolerated treatment well;Patient limited by fatigue    Behavior During Therapy WFL for tasks assessed/performed               Past Medical History:  Diagnosis Date   Allergy    seasonal   Anemia    Anxiety    Arthritis    Back pain, chronic    Borderline diabetes    Bronchitis    Constipation    current issue   Cough    Depression    Epilepsy    GERD (gastroesophageal reflux disease)    Hyperlipidemia    Hypertension    IBS (irritable bowel syndrome)    Joint pain    Obesity    Palpitations    Pneumonia 2016   Pre-diabetes    Renal sclerosis, unspecified    only has 1 kidney   Rheumatoid arthritis    Seizures    last >15 +  yrs ago- recorded 07-26-2021   Somatization disorder    Tobacco abuse    Past Surgical History:  Procedure Laterality Date   ABDOMINAL HYSTERECTOMY     ANTERIOR HIP REVISION Left 07/25/2016   Procedure: LEFT HIP IRRIGATION AND DEBRIDEMENT, REVISION OF HEAD AND LINER;  Surgeon: Leandrew Koyanagi, MD;  Location: [place];  Service: Orthopedics;  Laterality: Left;   APPLICATION OF WOUND VAC Left 12/23/2019   Procedure: APPLICATION OF WOUND VAC;  Surgeon: Leandrew Koyanagi, MD;  Location: [place];  Service: Orthopedics;  Laterality: Left;   BREAST EXCISIONAL BIOPSY Right    CHOLECYSTECTOMY     COLONOSCOPY     DEBRIDEMENT AND CLOSURE WOUND Left 07/17/2019   Procedure: Excision of left leg wound with ACell placement and primary closure;  Surgeon: Wallace Going, DO;  Location: [place];   Service: Plastics;  Laterality: Left;  60 min   EXCISIONAL TOTAL HIP ARTHROPLASTY WITH ANTIBIOTIC SPACERS Left 12/23/2019   Procedure: LEFT HIP IRRIGATION AND DEBRIDEMENT LEFT HIP WOUND and poly exchange ;  Surgeon: Leandrew Koyanagi, MD;  Location: [place];  Service: Orthopedics;  Laterality: Left;   HIP DEBRIDEMENT Left 01/19/2018   Procedure(s) Performed: IRRIGATION AND DEBRIDEMENT LEFT HIP (Left )   INCISION AND DRAINAGE HIP Left 08/03/2016   Procedure: IRRIGATION AND DEBRIDEMENT LEFT HIP, VAC PLACEMENT;  Surgeon: Leandrew Koyanagi, MD;  Location: [place];  Service: Orthopedics;  Laterality: Left;   INCISION AND DRAINAGE HIP Left 01/19/2018   Procedure: IRRIGATION AND DEBRIDEMENT LEFT HIP;  Surgeon: Leandrew Koyanagi, MD;  Location: [place];  Service: Orthopedics;  Laterality: Left;   kidney stent Right 2002   TONSILLECTOMY Bilateral 12/06/2014   Procedure: TONSILLECTOMY, BILATERAL;  Surgeon: Ruby Cola, MD;  Location: [place];  Service: ENT;  Laterality: Bilateral;   TONSILLECTOMY     TOTAL HIP ARTHROPLASTY Left 06/01/2016   Procedure: LEFT TOTAL HIP ARTHROPLASTY ANTERIOR APPROACH;  Surgeon: Leandrew Koyanagi, MD;  Location:  [place] [facility];  Service: Orthopedics;  Laterality: Left;   TUBAL LIGATION     WOUND EXPLORATION Left 01/19/2018   Procedure: WOUND EXPLORATION;  Surgeon: Leandrew Koyanagi, MD;  Location: [place];  Service: Orthopedics;  Laterality: Left;   Patient Active Problem List   Diagnosis Date Noted   Urinary retention 12/13/2021   Postoperative anemia due to acute blood loss 12/10/2021   Presence of left artificial hip joint 12/10/2021   Diabetes mellitus type 2 in obese 12/09/2021   Infection and inflammatory reaction due to other internal joint prosthesis, initial encounter 12/09/2021   Preop examination 12/09/2021   HTN (hypertension) 12/08/2021   Arthritis 11/26/2021   Seizures 11/26/2021   Rash 09/18/2020   Worsening headaches 01/21/2020   Pruritus 10/30/2019   Hypercalcemia 10/24/2019   Body mass  index 45.0-49.9, adult 08/27/2019   Intertrigo 07/09/2019   Facial rash 07/27/2018   Body mass index 40.0-44.9, adult 02/15/2018   Fatigue associated with anemia 02/07/2018   Unspecified open wound, left hip, initial encounter 01/19/2018   Wound of left leg, sequela 10/03/2016   Prosthetic joint infection of left hip 08/24/2016   Status post left hip replacement 06/01/2016   Sinus tachycardia 05/19/2016   History of adenomatous polyp of colon 03/21/2016   Diabetes 01/21/2016   Thyroid nodule 12/25/2014   Infiltrate noted on imaging study    Cervical spine arthritis 11/14/2014   Primary osteoarthritis of left hip 11/14/2014   Loss of consciousness 11/14/2014   Chest pain 03/26/2014   Musculoskeletal chest and rib pain 03/26/2014   Rib pain on right side 02/28/2014   Groin pain 04/18/2013   UNSPECIFIED RENAL SCLEROSIS 01/16/2009   GERD, SEVERE 05/25/2007   Morbid obesity ([place]) 04/03/2007   Dyslipidemia 08/31/2006   Major depression, recurrent 08/31/2006   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   Former smoker 08/31/2006   HYPERTENSION, BENIGN SYSTEMIC 08/31/2006   GASTRIC ULCER ACUTE WITHOUT HEMORRHAGE 08/31/2006   CONVULSIONS, SEIZURES, NOS 08/31/2006    REFERRING DIAG: [id number] (ICD-10-CM) - Presence of left artificial hip joint  THERAPY DIAG: Presence of left artificial hip joint   Rationale for Evaluation and Treatment Rehabilitation  PERTINENT HISTORY: Patient ID: OWYNN [name] is a 67 y.o. female.  Chief Complaint: Follow-up of the Left Hip  Months out from reimplantation over left total hip for chronic periprosthetic joint infection. Otherwise she is doing well. She rates her pain as a 3/10. She has been partial weight-bearing. She has had some accumulation of her seroma since removal of her drains. She has been off all of her antibiotics. Cultures were negative at the time of surgery   PRECAUTIONS:  posterior hip precautions   WEIGHT BEARING RESTRICTIONS: Yes WBAT       SUBJECTIVE:  SUBJECTIVE STATEMENT:  Kristin reports that she is a little sore today.  Some of the SLS activities at last visit were challenging.   PAIN:  Are you having pain? Yes: NPRS scale: 5/10 left, right hip 2-3/10 Pain location: L hip Pain description: ache Aggravating factors: walking Relieving factors: rest and meds   OBJECTIVE: (objective measures completed at initial evaluation unless otherwise dated)   DIAGNOSTIC FINDINGS: none noted   PATIENT SURVEYS:  FOTO 47(51 predicted) FOTO 42 (09/19/22)   COGNITION: Overall cognitive status: Within functional limits for tasks assessed                         SENSATION: Not tested   MUSCLE LENGTH: Hamstrings: Right 80 deg; Left 80 deg   POSTURE:  not assessed   PALPATION: deferred   LOWER EXTREMITY ROM:   Active ROM Right eval Left eval   Hip flexion 110 85   Hip extension       Hip abduction       Hip adduction       Hip internal rotation       Hip external rotation       Knee flexion [facility] WFL   Knee extension [facility]   Ankle dorsiflexion       Ankle plantarflexion       Ankle inversion       Ankle eversion        (Blank rows = not tested)   LOWER EXTREMITY MMT:   MMT Right eval Left eval  Hip flexion   4-  Hip extension   4-  Hip abduction   4-  Hip adduction      Hip internal rotation      Hip external rotation      Knee flexion   4-  Knee extension   4-  Ankle dorsiflexion      Ankle plantarflexion   4-  Ankle inversion      Ankle eversion       (Blank rows = not tested)   LOWER EXTREMITY SPECIAL TESTS:  Hip special tests: ITB negative    FUNCTIONAL TESTS: 30s chair stand 6 reps arms crossed 09/19/22: 2 MWT 215 feet  ( 1 standing rest break)      GAIT: Distance walked: 42ft  Assistive device utilized:  Environmental consultant - 2 wheeled Level of assistance: SBA Comments: slow cadence     TODAY'S TREATMENT:    [facility] Adult Kristin Treatment:                                                DATE: 10/06/22 Therapeutic Exercise: Nustep L5 8 min LTR - 20x Alternating clams - Black TB - 2x10 Supine bridge - small arc - 2x10 Supine march with black TB around legs - 3x10 ea // bar hurdle walking fwd L hip flexor stretch L SLR - 3x5  Neuromuscular re-ed: Semi tandem on foam 45'' bouts  [facility] Adult Kristin Treatment:                                                DATE: 10/04/22 Therapeutic Exercise: Nustep L5 8 min Weight shift onto 4 in block with [facility]  reach 15/15 to promote SLS and wean off walker L FAQs 5# 15x2 L hip flexor stretch with bolster 60s x2 Supine hip fallouts GTB 15x B, 15/15 unilateral. Bridge with feet on 1/2 roll 15x PPT 10x 3s hold PPT with alternating march 15/15 PF against wall 15x Mountain climbers on wall 15/15 with Kristin stabilizing L hip due to weakness with SLS   [facility] Adult Kristin Treatment:                                                DATE: 09/28/22 Therapeutic Exercise: Nustep L4 8 min Supine  hamstring stretch 30s x2 R SKTC with contralateral R hip flexor stretch Bridge 15x2  Supine march alt. 15/15 x 2 w/ YTB Left SLR 3x5 no assist Supine hip fallouts Bil GTB 2x10 S/L clams Left AROM- 2x10 small arc S/L reverse Left clam 2 x 15  Therapeutic Activity - collecting information for goals, checking progress, and reviewing with patient  [facility] Adult Kristin Treatment:                                                DATE: 09/19/22 Therapeutic Exercise: Nustep L4 8 min Supine  hamstring stretch 30s x2 Bridge 15x2  Supine march alt. 15/15 x 2 Left SLR x 3 -min lift, used strap x 5 for assist  Supine hip fallouts Bil GTB 15x ea S/L clams Left AROM- 2x15 S/L reverse Left clam 2 x 15 2 MWT 215 feet  ( 1 standing rest break)    [facility] Adult Kristin Treatment:                                                 DATE: 08-24-22 Aquatic therapy at [address] - therapeutic pool temp 92 degrees Kristin enters building independently but needed W/C to transport to area and needed to be wheeled to handrails of pool.    Treatment took place in water 3.8 to  4 ft 8 in.feet deep depending upon activity.  Kristin entered and exited the pool via stair and handrails   Pain level 5/10 at beginning of session.  At end 3/10 Aquatic Therapy:  Water walking for warm up fwd/lat with aqua jogger  Standing:all exercise using aquatic fins for added resistance  Standing march - 3x10 ea Hamstring curl 2x10 ea BIL Hip abd/add 2x10 BIL Hip Circles CC/CCW 2x10 each BIL Heel raises - 3x20 Hip ext/flex with knee straight x 20 BIL Mini squats x20 3 way hip kicks on RT and on LT 15 x  Sitting on bench in water: no aquatic fins Bicycle kicks x1' Reverse bicycle kicks x1' Flutter kicks x1' Scissor kicks x1' Seated dumbell push downs - rainbow - 2x10 Aqua stretch over LT hip /scar tissue massage over distended tissue/liquid pouch Ended session with water ambulation forward and backward without AD or aquatic flotation and increased stride length with aquatic fins bil Kristin requires the buoyancy of water for active assisted exercises with buoyancy supported for strengthening and AROM exercises. Hydrostatic pressure also supports joints by unweighting joint load by at least  50 % in 3-4 feet depth water. 80% in chest to neck deep water. Water will provide assistance with movement using the current and laminar flow while the buoyancy reduces weight bearing. Kristin requires the viscosity of the water for resistance with strengthening exercises.    [facility] Adult Kristin Treatment:                                                DATE: 08/22/22 Therapeutic Exercise: Nustep L4 8 min Seated hamstring stretch 30s x2 Bridge with ball 15x2  Supine march alt. 15/15 x2 2# Supine hip fallouts Bil GTB 15x ea S/L clams L GTB 15x2 L hip flexor  stretch over table 30s x2 FAQs with adduction 15x2 2# WS onto 6 in block with OH in walker 15/15 emphasize upright posture.         PATIENT EDUCATION:  Education details: Discussed eval findings, rehab rationale and POC and patient is in agreement  Person educated: Patient Education method: Explanation Education comprehension: verbalized understanding and needs further education   HOME EXERCISE PROGRAM: Access Code: [email or web address] URL: [email or web address] Date: 08/01/2022 Prepared by: Sharlynn Oliphant   Exercises - Sit to Stand Without Arm Support  - 2 x daily - 5 x weekly - 1 sets - 5 reps - Supine Bridge  - 2 x daily - 5 x weekly - 1 sets - 10 reps - Standing Heel Raise with Support  - 2 x daily - 5 x weekly - 1 sets - 10 reps   ASSESSMENT:   CLINICAL IMPRESSION: Marene tolerated session well with no adverse reaction.  Worked on hurdle stepping in // to promote L hip flexion and comfort in L stance (alternating steps).  Kristin [name] is significantly challenged with hip flexion in standing d/t weakness and some pain.  Significant instability in tandem stance on foam.  Will continue to progress as tolerated.   OBJECTIVE IMPAIRMENTS: Abnormal gait, decreased activity tolerance, decreased balance, decreased endurance, decreased mobility, difficulty walking, decreased ROM, decreased strength, impaired perceived functional ability, and pain.    ACTIVITY LIMITATIONS: carrying, lifting, sitting, standing, squatting, sleeping, and stairs   PERSONAL FACTORS: Fitness, Past/current experiences, Time since onset of injury/illness/exacerbation, and 1 comorbidity: DM  are also affecting patient's functional outcome.    REHAB POTENTIAL: Good   CLINICAL DECISION MAKING: Evolving/moderate complexity   EVALUATION COMPLEXITY: Low     GOALS: Goals reviewed with patient? Yes   SHORT TERM GOALS: Target date: 08/22/2022   Patient to demonstrate independence in  HEP Baseline:[id number] Goal status: Met   2.  227ft ambulation with RW and SBA Baseline: 70ft with RW and SBA 09/19/22: 215 feet Mod I Goal status: MET      LONG TERM GOALS: Target date: 09/12/2022 (extended to 11/23/2022)     Increase FOTO score to 51 Baseline: 47 09/19/22: 42 Goal status: ONGOING   2.  Decrease worst pain to 3/10 Baseline: 7/10 09/19/22: 5/10 Goal status: ONGOING   3.  Increase RLE strength to 4/5 Baseline:  MMT Right eval Left eval 3/27  Hip flexion   4- 4  Hip extension   4-   Hip abduction   4-   Hip adduction       Hip internal rotation       Hip external rotation       Knee flexion   4- 4  Knee  extension   4- 4  Ankle dorsiflexion       Ankle plantarflexion   4- 4    Goal status: ONGOING   4.  Patient to ambulate 200 ft with LRAD Baseline: 69ft with RW 09/19/22: 215 ft with RW  Goal status: MET  5. Patient to ambulate 275 ft with LRAD in 2 MWT Baseline: 52ft with RW 09/19/22: 215 ft with RW  Goal status: *NEW*       PLAN:   Kristin FREQUENCY: 2x/week   Kristin DURATION: 8 weeks (11/23/2022)   PLANNED INTERVENTIONS: Therapeutic exercises, Therapeutic activity, Neuromuscular re-education, Balance training, Gait training, Patient/Family education, Self Care, Joint mobilization, Stair training, DME instructions, Aquatic Therapy, and Re-evaluation   PLAN FOR NEXT SESSION: RE-eval: HEP review and update, aerobic tasks, LE strengthening, gait training, balance/proprioceptive work      Mathis Dad Kristin 10/06/22 10:43 AM Phone: [phone] Fax: [phone]

## 2022-10-10 ENCOUNTER — Other Ambulatory Visit: Payer: Self-pay | Admitting: Student

## 2022-10-10 ENCOUNTER — Ambulatory Visit: Payer: 59 | Admitting: Physical Therapy

## 2022-10-10 ENCOUNTER — Encounter: Payer: Self-pay | Admitting: Physical Therapy

## 2022-10-10 DIAGNOSIS — Z96649 Presence of unspecified artificial hip joint: Secondary | ICD-10-CM

## 2022-10-10 DIAGNOSIS — M6281 Muscle weakness (generalized): Secondary | ICD-10-CM

## 2022-10-10 DIAGNOSIS — M25552 Pain in left hip: Secondary | ICD-10-CM

## 2022-10-10 NOTE — Therapy (Signed)
Daily Note     Patient Name: Gerhard PerchesDeborah L Riede MRN: [id number] DOB:14-Aug-1955, 67 y.o., female Today's Date: 10/10/2022  PCP: Alfredo MartinezMaxwell, Allee, MD    REFERRING PROVIDER: Daneen SchickSpringer, Bryan D, MD  END OF SESSION:   PT End of Session - 10/10/22 0935     Visit Number 13    Date for PT Re-Evaluation 11/23/22    Authorization Type [employer] MCR    PT Start Time 0932    PT Stop Time 1016    PT Time Calculation (min) 44 min               Past Medical History:  Diagnosis Date   Allergy    seasonal   Anemia    Anxiety    Arthritis    Back pain, chronic    Borderline diabetes    Bronchitis    Constipation    current issue   Cough    Depression    Epilepsy    GERD (gastroesophageal reflux disease)    Hyperlipidemia    Hypertension    IBS (irritable bowel syndrome)    Joint pain    Obesity    Palpitations    Pneumonia 2016   Pre-diabetes    Renal sclerosis, unspecified    only has 1 kidney   Rheumatoid arthritis    Seizures    last >15 +  yrs ago- recorded 07-26-2021   Somatization disorder    Tobacco abuse    Past Surgical History:  Procedure Laterality Date   ABDOMINAL HYSTERECTOMY     ANTERIOR HIP REVISION Left 07/25/2016   Procedure: LEFT HIP IRRIGATION AND DEBRIDEMENT, REVISION OF HEAD AND LINER;  Surgeon: Tarry KosNaiping M Xu, MD;  Location: [facility] OR;  Service: Orthopedics;  Laterality: Left;   APPLICATION OF WOUND VAC Left 12/23/2019   Procedure: APPLICATION OF WOUND VAC;  Surgeon: Tarry KosXu, Naiping M, MD;  Location: [facility] OR;  Service: Orthopedics;  Laterality: Left;   BREAST EXCISIONAL BIOPSY Right    CHOLECYSTECTOMY     COLONOSCOPY     DEBRIDEMENT AND CLOSURE WOUND Left 07/17/2019   Procedure: Excision of left leg wound with ACell placement and primary closure;  Surgeon: Peggye Formillingham, Claire S, DO;  Location: [facility];  Service: Plastics;  Laterality: Left;  60 min   EXCISIONAL TOTAL HIP ARTHROPLASTY WITH ANTIBIOTIC SPACERS Left 12/23/2019   Procedure: LEFT HIP  IRRIGATION AND DEBRIDEMENT LEFT HIP WOUND and poly exchange ;  Surgeon: Tarry KosXu, Naiping M, MD;  Location: [facility] OR;  Service: Orthopedics;  Laterality: Left;   HIP DEBRIDEMENT Left 01/19/2018   Procedure(s) Performed: IRRIGATION AND DEBRIDEMENT LEFT HIP (Left )   INCISION AND DRAINAGE HIP Left 08/03/2016   Procedure: IRRIGATION AND DEBRIDEMENT LEFT HIP, VAC PLACEMENT;  Surgeon: Tarry KosNaiping M Xu, MD;  Location: [facility];  Service: Orthopedics;  Laterality: Left;   INCISION AND DRAINAGE HIP Left 01/19/2018   Procedure: IRRIGATION AND DEBRIDEMENT LEFT HIP;  Surgeon: Tarry KosXu, Naiping M, MD;  Location: [facility];  Service: Orthopedics;  Laterality: Left;   kidney stent Right 2002   TONSILLECTOMY Bilateral 12/06/2014   Procedure: TONSILLECTOMY, BILATERAL;  Surgeon: Melvenia BeamMitchell Gore, MD;  [facility];  Service: ENT;  Laterality: Bilateral;   TONSILLECTOMY     TOTAL HIP ARTHROPLASTY Left 06/01/2016   Procedure: LEFT TOTAL HIP ARTHROPLASTY ANTERIOR APPROACH;  Surgeon: Tarry KosNaiping M Xu, MD;  Location: [facility];  Service: Orthopedics;  Laterality: Left;   TUBAL LIGATION     WOUND EXPLORATION Left 01/19/2018  Procedure: WOUND EXPLORATION;  Surgeon: Tarry Kos, MD;  Location: [facility] OR;  Service: Orthopedics;  Laterality: Left;   Patient Active Problem List   Diagnosis Date Noted   Urinary retention 12/13/2021   Postoperative anemia due to acute blood loss 12/10/2021   Presence of left artificial hip joint 12/10/2021   Diabetes mellitus type 2 in obese 12/09/2021   Infection and inflammatory reaction due to other internal joint prosthesis, initial encounter 12/09/2021   Preop examination 12/09/2021   HTN (hypertension) 12/08/2021   Arthritis 11/26/2021   Seizures 11/26/2021   Rash 09/18/2020   Worsening headaches 01/21/2020   Pruritus 10/30/2019   Hypercalcemia 10/24/2019   Body mass index 45.0-49.9, adult 08/27/2019   Intertrigo 07/09/2019   Facial rash 07/27/2018   Body mass index 40.0-44.9, adult 02/15/2018   Fatigue  associated with anemia 02/07/2018   Unspecified open wound, left hip, initial encounter 01/19/2018   Wound of left leg, sequela 10/03/2016   Prosthetic joint infection of left hip 08/24/2016   Status post left hip replacement 06/01/2016   Sinus tachycardia 05/19/2016   History of adenomatous polyp of colon 03/21/2016   Diabetes 01/21/2016   Thyroid nodule 12/25/2014   Infiltrate noted on imaging study    Cervical spine arthritis 11/14/2014   Primary osteoarthritis of left hip 11/14/2014   Loss of consciousness 11/14/2014   Chest pain 03/26/2014   Musculoskeletal chest and rib pain 03/26/2014   Rib pain on right side 02/28/2014   Groin pain 04/18/2013   UNSPECIFIED RENAL SCLEROSIS 01/16/2009   GERD, SEVERE 05/25/2007   Morbid obesity (HCC) 04/03/2007   Dyslipidemia 08/31/2006   Major depression, recurrent 08/31/2006   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   Former smoker 08/31/2006   HYPERTENSION, BENIGN SYSTEMIC 08/31/2006   GASTRIC ULCER ACUTE WITHOUT HEMORRHAGE 08/31/2006   CONVULSIONS, SEIZURES, NOS 08/31/2006    REFERRING DIAG: B93.903 (ICD-10-CM) - Presence of left artificial hip joint  THERAPY DIAG: Presence of left artificial hip joint   Rationale for Evaluation and Treatment Rehabilitation  PERTINENT HISTORY: Patient ID: RENEE INTRIAGO is a 67 y.o. female.  Chief Complaint: Follow-up of the Left Hip  Months out from reimplantation over left total hip for chronic periprosthetic joint infection. Otherwise she is doing well. She rates her pain as a 3/10. She has been partial weight-bearing. She has had some accumulation of her seroma since removal of her drains. She has been off all of her antibiotics. Cultures were negative at the time of surgery   PRECAUTIONS:  posterior hip precautions   WEIGHT BEARING RESTRICTIONS: Yes WBAT      SUBJECTIVE:  SUBJECTIVE STATEMENT: Doing good today. Felt good after last session.    PAIN:  Are you having pain? Yes: NPRS scale: 3/10 left, right hip 0/10 Pain location: L hip Pain description: ache Aggravating factors: walking Relieving factors: rest and meds   OBJECTIVE: (objective measures completed at initial evaluation unless otherwise dated)   DIAGNOSTIC FINDINGS: none noted   PATIENT SURVEYS:  FOTO 47(51 predicted) FOTO 42 (09/19/22)   COGNITION: Overall cognitive status: Within functional limits for tasks assessed                         SENSATION: Not tested   MUSCLE LENGTH: Hamstrings: Right 80 deg; Left 80 deg   POSTURE:  not assessed   PALPATION: deferred   LOWER EXTREMITY ROM:   Active ROM Right eval Left eval Left 10/10/22  Hip flexion 110 85 95  Hip extension       Hip abduction       Hip adduction       Hip internal rotation       Hip external rotation       Knee flexion [facility] WFL   Knee extension [facility]   Ankle dorsiflexion       Ankle plantarflexion       Ankle inversion       Ankle eversion        (Blank rows = not tested)   LOWER EXTREMITY MMT:   MMT Right eval Left eval 3/27 See goal section  Hip flexion   4-   Hip extension   4-   Hip abduction   4-   Hip adduction       Hip internal rotation       Hip external rotation       Knee flexion   4-   Knee extension   4-   Ankle dorsiflexion       Ankle plantarflexion   4-   Ankle inversion       Ankle eversion        (Blank rows = not tested)   LOWER EXTREMITY SPECIAL TESTS:  Hip special tests: ITB negative    FUNCTIONAL TESTS: 30s chair stand 6 reps arms crossed 09/19/22: 2 MWT 215 feet  ( 1 standing rest break)      GAIT: Distance walked: 89ft  Assistive device utilized: Environmental consultant - 2 wheeled Level of assistance: SBA Comments: slow cadence     TODAY'S TREATMENT:   [facility] Adult PT Treatment:                                                 DATE: 10/10/22 Therapeutic Exercise: Nustep L5 x 8 min Standing hip flexion 10 x 1 each LAQ 5# 15 x 2  Bridge 10 x 2   L SLR 3 x 5  Blue band supine clam, bilat and unilateral x 15 each Supine March with blue band x 15  LTR feet narrow, feet wide  Piriformis stretch with towel assist Supine hamstring stretch with towel assist  Neuromuscular re-ed: Narrow stance with head turns, head nods Staggered stance Staggered stance on Airex 35 sec x 2  PPT Therapeutic Activity: 2 MWT  176 Feet, seated rest break due to pain.     [facility] Adult PT Treatment:  DATE: 10/06/22 Therapeutic Exercise: Nustep L5 8 min LTR - 20x Alternating clams - Black TB - 2x10 Supine bridge - small arc - 2x10 Supine march with black TB around legs - 3x10 ea // bar hurdle walking fwd L hip flexor stretch L SLR - 3x5  Neuromuscular re-ed: Semi tandem on foam 45'' bouts  [facility] Adult PT Treatment:                                                DATE: 10/04/22 Therapeutic Exercise: Nustep L5 8 min Weight shift onto 4 in block with OH reach 15/15 to promote SLS and wean off walker L FAQs 5# 15x2 L hip flexor stretch with bolster 60s x2 Supine hip fallouts GTB 15x B, 15/15 unilateral. Bridge with feet on 1/2 roll 15x PPT 10x 3s hold PPT with alternating march 15/15 PF against wall 15x Mountain climbers on wall 15/15 with PT stabilizing L hip due to weakness with SLS   [facility] Adult PT Treatment:                                                DATE: 09/28/22 Therapeutic Exercise: Nustep L4 8 min Supine  hamstring stretch 30s x2 R SKTC with contralateral R hip flexor stretch Bridge 15x2  Supine march alt. 15/15 x 2 w/ YTB Left SLR 3x5 no assist Supine hip fallouts Bil GTB 2x10 S/L clams Left AROM- 2x10 small arc S/L reverse Left clam 2 x 15  Therapeutic Activity - collecting information for goals, checking progress, and reviewing with patient  [facility]  Adult PT Treatment:                                                DATE: 09/19/22 Therapeutic Exercise: Nustep L4 8 min Supine  hamstring stretch 30s x2 Bridge 15x2  Supine march alt. 15/15 x 2 Left SLR x 3 -min lift, used strap x 5 for assist  Supine hip fallouts Bil GTB 15x ea S/L clams Left AROM- 2x15 S/L reverse Left clam 2 x 15 2 MWT 215 feet  ( 1 standing rest break)    [facility] Adult PT Treatment:                                                DATE: 08-24-22 Aquatic therapy at [facility]- [address] - therapeutic pool temp 92 degrees Pt enters building independently but needed W/C to transport to area and needed to be wheeled to handrails of pool.    Treatment took place in water 3.8 to  4 ft 8 in.feet deep depending upon activity.  Pt entered and exited the pool via stair and handrails   Pain level 5/10 at beginning of session.  At end 3/10 Aquatic Therapy:  Water walking for warm up fwd/lat with aqua jogger  Standing:all exercise using aquatic fins for added resistance  Standing march - 3x10 ea Hamstring curl 2x10 ea BIL Hip abd/add 2x10 BIL  Hip Circles CC/CCW 2x10 each BIL Heel raises - 3x20 Hip ext/flex with knee straight x 20 BIL Mini squats x20 3 way hip kicks on RT and on LT 15 x  Sitting on bench in water: no aquatic fins Bicycle kicks x1' Reverse bicycle kicks x1' Flutter kicks x1' Scissor kicks x1' Seated dumbell push downs - rainbow - 2x10 Aqua stretch over LT hip /scar tissue massage over distended tissue/liquid pouch Ended session with water ambulation forward and backward without AD or aquatic flotation and increased stride length with aquatic fins bil Pt requires the buoyancy of water for active assisted exercises with buoyancy supported for strengthening and AROM exercises. Hydrostatic pressure also supports joints by unweighting joint load by at least 50 % in 3-4 feet depth water. 80% in chest to neck deep water. Water will provide assistance with  movement using the current and laminar flow while the buoyancy reduces weight bearing. Pt requires the viscosity of the water for resistance with strengthening exercises.    [facility] Adult PT Treatment:                                                DATE: 08/22/22 Therapeutic Exercise: Nustep L4 8 min Seated hamstring stretch 30s x2 Bridge with ball 15x2  Supine march alt. 15/15 x2 2# Supine hip fallouts Bil GTB 15x ea S/L clams L GTB 15x2 L hip flexor stretch over table 30s x2 FAQs with adduction 15x2 2# WS onto 6 in block with OH in walker 15/15 emphasize upright posture.         PATIENT EDUCATION:  Education details: Discussed eval findings, rehab rationale and POC and patient is in agreement  Person educated: Patient Education method: Explanation Education comprehension: verbalized understanding and needs further education   HOME EXERCISE PROGRAM: Access Code: [email or web address] URL: [email or web address] Date: 08/01/2022 Prepared by: Gustavus Bryant   Exercises - Sit to Stand Without Arm Support  - 2 x daily - 5 x weekly - 1 sets - 5 reps - Supine Bridge  - 2 x daily - 5 x weekly - 1 sets - 10 reps - Standing Heel Raise with Support  - 2 x daily - 5 x weekly - 1 sets - 10 reps   ASSESSMENT:   CLINICAL IMPRESSION: Kelsea tolerated session well with no adverse reaction.Decreased gait distance on 2 MWT today due to c/o anterior left hip pain , requesting seated rest break. Her AROM hip flexion has improved from 85 degrees to 95 degrees. Continued with closed and open chain strengthening as well as balance challenges. Will continue to progress as tolerated.   OBJECTIVE IMPAIRMENTS: Abnormal gait, decreased activity tolerance, decreased balance, decreased endurance, decreased mobility, difficulty walking, decreased ROM, decreased strength, impaired perceived functional ability, and pain.    ACTIVITY LIMITATIONS: carrying, lifting, sitting, standing, squatting, sleeping,  and stairs   PERSONAL FACTORS: Fitness, Past/current experiences, Time since onset of injury/illness/exacerbation, and 1 comorbidity: DM  are also affecting patient's functional outcome.    REHAB POTENTIAL: Good   CLINICAL DECISION MAKING: Evolving/moderate complexity   EVALUATION COMPLEXITY: Low     GOALS: Goals reviewed with patient? Yes   SHORT TERM GOALS: Target date: 08/22/2022   Patient to demonstrate independence in HEP Baseline:[id number] Goal status: Met   2.  253ft ambulation with RW and SBA Baseline: 58ft with RW and SBA 09/19/22: 215 feet  Mod I Goal status: MET      LONG TERM GOALS: Target date: 09/12/2022 (extended to 11/23/2022)     Increase FOTO score to 51 Baseline: 47 09/19/22: 42 Goal status: ONGOING   2.  Decrease worst pain to 3/10 Baseline: 7/10 09/19/22: 5/10 Goal status: ONGOING   3.  Increase RLE strength to 4/5 Baseline:  MMT Right eval Left eval 3/27  Hip flexion   4- 4  Hip extension   4-   Hip abduction   4-   Hip adduction       Hip internal rotation       Hip external rotation       Knee flexion   4- 4  Knee extension   4- 4  Ankle dorsiflexion       Ankle plantarflexion   4- 4    Goal status: ONGOING   4.  Patient to ambulate 200 ft with LRAD Baseline: 48ft with RW 09/19/22: 215 ft with RW  Goal status: MET  5. Patient to ambulate 275 ft with LRAD in 2 MWT Baseline: 72ft with RW 09/19/22: 215 ft with RW  10/10/22: 176 feet with RW, seated rest required  Goal status: *NEW*       PLAN:   PT FREQUENCY: 2x/week   PT DURATION: 8 weeks (11/23/2022)   PLANNED INTERVENTIONS: Therapeutic exercises, Therapeutic activity, Neuromuscular re-education, Balance training, Gait training, Patient/Family education, Self Care, Joint mobilization, Stair training, DME instructions, Aquatic Therapy, and Re-evaluation   PLAN FOR NEXT SESSION: RE-eval: HEP review and update, aerobic tasks, LE strengthening, gait training, balance/proprioceptive  work      Royden Purl PTA 10/10/22 10:18 AM Phone: [phone] Fax: [phone]

## 2022-10-13 ENCOUNTER — Encounter: Payer: Self-pay | Admitting: Physical Therapy

## 2022-10-13 ENCOUNTER — Ambulatory Visit: Payer: 59 | Admitting: Physical Therapy

## 2022-10-13 DIAGNOSIS — M6281 Muscle weakness (generalized): Secondary | ICD-10-CM

## 2022-10-13 DIAGNOSIS — Z96649 Presence of unspecified artificial hip joint: Secondary | ICD-10-CM

## 2022-10-13 DIAGNOSIS — M25552 Pain in left hip: Secondary | ICD-10-CM

## 2022-10-13 NOTE — Therapy (Signed)
Daily Note     Patient Name: Kristin [name] MRN: [id number] DOB:1955-10-06, 67 y.o., female Today's Date: 10/13/2022  PCP: Alfredo Martinez, MD    REFERRING PROVIDER: Daneen Schick, MD  END OF SESSION:   PT End of Session - 10/13/22 1035     Visit Number 14    Date for PT Re-Evaluation 11/23/22    Authorization Type [employer] MCR    PT Start Time 1040    PT Stop Time 1120    PT Time Calculation (min) 40 min               Past Medical History:  Diagnosis Date   Allergy    seasonal   Anemia    Anxiety    Arthritis    Back pain, chronic    Borderline diabetes    Bronchitis    Constipation    current issue   Cough    Depression    Epilepsy    GERD (gastroesophageal reflux disease)    Hyperlipidemia    Hypertension    IBS (irritable bowel syndrome)    Joint pain    Obesity    Palpitations    Pneumonia 2016   Pre-diabetes    Renal sclerosis, unspecified    only has 1 kidney   Rheumatoid arthritis    Seizures    last >15 +  yrs ago- recorded 07-26-2021   Somatization disorder    Tobacco abuse    Past Surgical History:  Procedure Laterality Date   ABDOMINAL HYSTERECTOMY     ANTERIOR HIP REVISION Left 07/25/2016   Procedure: LEFT HIP IRRIGATION AND DEBRIDEMENT, REVISION OF HEAD AND LINER;  Surgeon: Tarry Kos, MD;  Location: [facility] OR;  Service: Orthopedics;  Laterality: Left;   APPLICATION OF WOUND VAC Left 12/23/2019   Procedure: APPLICATION OF WOUND VAC;  Surgeon: Tarry Kos, MD;  Location: [facility] OR;  Service: Orthopedics;  Laterality: Left;   BREAST EXCISIONAL BIOPSY Right    CHOLECYSTECTOMY     COLONOSCOPY     DEBRIDEMENT AND CLOSURE WOUND Left 07/17/2019   Procedure: Excision of left leg wound with ACell placement and primary closure;  Surgeon: Peggye Form, DO;  Location: [facility];  Service: Plastics;  Laterality: Left;  60 min   EXCISIONAL TOTAL HIP ARTHROPLASTY WITH ANTIBIOTIC SPACERS Left 12/23/2019   Procedure: LEFT HIP  IRRIGATION AND DEBRIDEMENT LEFT HIP WOUND and poly exchange ;  Surgeon: Tarry Kos, MD;  Location: [facility] OR;  Service: Orthopedics;  Laterality: Left;   HIP DEBRIDEMENT Left 01/19/2018   Procedure(s) Performed: IRRIGATION AND DEBRIDEMENT LEFT HIP (Left )   INCISION AND DRAINAGE HIP Left 08/03/2016   Procedure: IRRIGATION AND DEBRIDEMENT LEFT HIP, VAC PLACEMENT;  Surgeon: Tarry Kos, MD;  Location: [facility];  Service: Orthopedics;  Laterality: Left;   INCISION AND DRAINAGE HIP Left 01/19/2018   Procedure: IRRIGATION AND DEBRIDEMENT LEFT HIP;  Surgeon: Tarry Kos, MD;  Location: [facility] OR;  Service: Orthopedics;  Laterality: Left;   kidney stent Right 2002   TONSILLECTOMY Bilateral 12/06/2014   Procedure: TONSILLECTOMY, BILATERAL;  Surgeon: Melvenia Beam, MD;  Location: [facility] OR;  Service: ENT;  Laterality: Bilateral;   TONSILLECTOMY     TOTAL HIP ARTHROPLASTY Left 06/01/2016   Procedure: LEFT TOTAL HIP ARTHROPLASTY ANTERIOR APPROACH;  Surgeon: Tarry Kos, MD;  Location: [facility];  Service: Orthopedics;  Laterality: Left;   TUBAL LIGATION     WOUND EXPLORATION Left 01/19/2018  Procedure: WOUND EXPLORATION;  Surgeon: Tarry Kos, MD;  Location: [facility] OR;  Service: Orthopedics;  Laterality: Left;   Patient Active Problem List   Diagnosis Date Noted   Urinary retention 12/13/2021   Postoperative anemia due to acute blood loss 12/10/2021   Presence of left artificial hip joint 12/10/2021   Diabetes mellitus type 2 in obese 12/09/2021   Infection and inflammatory reaction due to other internal joint prosthesis, initial encounter 12/09/2021   Preop examination 12/09/2021   HTN (hypertension) 12/08/2021   Arthritis 11/26/2021   Seizures 11/26/2021   Rash 09/18/2020   Worsening headaches 01/21/2020   Pruritus 10/30/2019   Hypercalcemia 10/24/2019   Body mass index 45.0-49.9, adult 08/27/2019   Intertrigo 07/09/2019   Facial rash 07/27/2018   Body mass index 40.0-44.9, adult 02/15/2018   Fatigue  associated with anemia 02/07/2018   Unspecified open wound, left hip, initial encounter 01/19/2018   Wound of left leg, sequela 10/03/2016   Prosthetic joint infection of left hip 08/24/2016   Status post left hip replacement 06/01/2016   Sinus tachycardia 05/19/2016   History of adenomatous polyp of colon 03/21/2016   Diabetes 01/21/2016   Thyroid nodule 12/25/2014   Infiltrate noted on imaging study    Cervical spine arthritis 11/14/2014   Primary osteoarthritis of left hip 11/14/2014   Loss of consciousness 11/14/2014   Chest pain 03/26/2014   Musculoskeletal chest and rib pain 03/26/2014   Rib pain on right side 02/28/2014   Groin pain 04/18/2013   UNSPECIFIED RENAL SCLEROSIS 01/16/2009   GERD, SEVERE 05/25/2007   Morbid obesity (HCC) 04/03/2007   Dyslipidemia 08/31/2006   Major depression, recurrent 08/31/2006   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   Former smoker 08/31/2006   HYPERTENSION, BENIGN SYSTEMIC 08/31/2006   GASTRIC ULCER ACUTE WITHOUT HEMORRHAGE 08/31/2006   CONVULSIONS, SEIZURES, NOS 08/31/2006    REFERRING DIAG: B93.903 (ICD-10-CM) - Presence of left artificial hip joint  THERAPY DIAG: Presence of left artificial hip joint   Rationale for Evaluation and Treatment Rehabilitation  PERTINENT HISTORY: Patient ID: Kristin [name] is a 67 y.o. female.  Chief Complaint: Follow-up of the Left Hip  Months out from reimplantation over left total hip for chronic periprosthetic joint infection. Otherwise she is doing well. She rates her pain as a 3/10. She has been partial weight-bearing. She has had some accumulation of her seroma since removal of her drains. She has been off all of her antibiotics. Cultures were negative at the time of surgery   PRECAUTIONS:  posterior hip precautions   WEIGHT BEARING RESTRICTIONS: Yes WBAT      SUBJECTIVE:  SUBJECTIVE STATEMENT: Doing good today. Felt good after last session.    PAIN:  Are you having pain? Yes: NPRS scale: 3/10 left, right hip 0/10 Pain location: L hip Pain description: ache Aggravating factors: walking Relieving factors: rest and meds   OBJECTIVE: (objective measures completed at initial evaluation unless otherwise dated)   DIAGNOSTIC FINDINGS: none noted   PATIENT SURVEYS:  FOTO 47(51 predicted) FOTO 42 (09/19/22)   COGNITION: Overall cognitive status: Within functional limits for tasks assessed                         SENSATION: Not tested   MUSCLE LENGTH: Hamstrings: Right 80 deg; Left 80 deg   POSTURE:  not assessed   PALPATION: deferred   LOWER EXTREMITY ROM:   Active ROM Right eval Left eval Left 10/10/22  Hip flexion 110 85 95  Hip extension       Hip abduction       Hip adduction       Hip internal rotation       Hip external rotation       Knee flexion [facility] WFL   Knee extension [facility]   Ankle dorsiflexion       Ankle plantarflexion       Ankle inversion       Ankle eversion        (Blank rows = not tested)   LOWER EXTREMITY MMT:   MMT Right eval Left eval 3/27 See goal section  Hip flexion   4-   Hip extension   4-   Hip abduction   4-   Hip adduction       Hip internal rotation       Hip external rotation       Knee flexion   4-   Knee extension   4-   Ankle dorsiflexion       Ankle plantarflexion   4-   Ankle inversion       Ankle eversion        (Blank rows = not tested)   LOWER EXTREMITY SPECIAL TESTS:  Hip special tests: ITB negative    FUNCTIONAL TESTS: 30s chair stand 6 reps arms crossed 09/19/22: 2 MWT 215 feet  ( 1 standing rest break)      GAIT: Distance walked: 97ft  Assistive device utilized: Environmental consultant - 2 wheeled Level of assistance: SBA Comments: slow cadence     TODAY'S TREATMENT:    [facility] Adult PT Treatment:                                                 DATE: 10/13/22 Therapeutic Exercise: Nustep L6 6 min LTR - 20x Alternating clams - Black TB - 2x10 Supine bridge - small arc - with black TB clam - 2x10 Staggered bridge - 2x10 - small arc // bar hurdle walking fwd L hip flexor stretch in supine with R knee flexion and therapist pressure on L thigh Knee ext machine - 10# - 3x10 HS curl machine - 25# - 2x10  Neuromuscular re-ed: Narrow stance with head turns, head nods Staggered stance on Airex 45 second bouts Rocker board DF/PF - blue  [facility] Adult PT Treatment:  DATE: 10/10/22 Therapeutic Exercise: Nustep L5 x 8 min Standing hip flexion 10 x 1 each LAQ 5# 15 x 2  Bridge 10 x 2   L SLR 3 x 5  Blue band supine clam, bilat and unilateral x 15 each Supine March with blue band x 15  LTR feet narrow, feet wide  Piriformis stretch with towel assist Supine hamstring stretch with towel assist  Neuromuscular re-ed: Narrow stance with head turns, head nods Staggered stance Staggered stance on Airex 35 sec x 2  PPT Therapeutic Activity: 2 MWT  176 Feet, seated rest break due to pain.     [facility] Adult PT Treatment:                                                DATE: 10/06/22 Therapeutic Exercise: Nustep L5 8 min LTR - 20x Alternating clams - Black TB - 2x10 Supine bridge - small arc - 2x10 Supine march with black TB around legs - 3x10 ea // bar hurdle walking fwd L hip flexor stretch L SLR - 3x5  Neuromuscular re-ed: Semi tandem on foam 45'' bouts  [facility] Adult PT Treatment:                                                DATE: 10/04/22 Therapeutic Exercise: Nustep L5 8 min Weight shift onto 4 in block with OH reach 15/15 to promote SLS and wean off walker L FAQs 5# 15x2 L hip flexor stretch with bolster 60s x2 Supine hip fallouts GTB 15x B, 15/15 unilateral. Bridge with feet on 1/2 roll 15x PPT 10x 3s hold PPT with alternating march 15/15 PF against wall  15x Mountain climbers on wall 15/15 with PT stabilizing L hip due to weakness with SLS   [facility] Adult PT Treatment:                                                DATE: 09/28/22 Therapeutic Exercise: Nustep L4 8 min Supine  hamstring stretch 30s x2 R SKTC with contralateral R hip flexor stretch Bridge 15x2  Supine march alt. 15/15 x 2 w/ YTB Left SLR 3x5 no assist Supine hip fallouts Bil GTB 2x10 S/L clams Left AROM- 2x10 small arc S/L reverse Left clam 2 x 15  Therapeutic Activity - collecting information for goals, checking progress, and reviewing with patient  [facility] Adult PT Treatment:                                                DATE: 09/19/22 Therapeutic Exercise: Nustep L4 8 min Supine  hamstring stretch 30s x2 Bridge 15x2  Supine march alt. 15/15 x 2 Left SLR x 3 -min lift, used strap x 5 for assist  Supine hip fallouts Bil GTB 15x ea S/L clams Left AROM- 2x15 S/L reverse Left clam 2 x 15 2 MWT 215 feet  ( 1 standing rest break)    [facility] Adult PT Treatment:  DATE: 08-24-22 Aquatic therapy at [facility]- [address] - therapeutic pool temp 92 degrees Pt enters building independently but needed W/C to transport to area and needed to be wheeled to handrails of pool.    Treatment took place in water 3.8 to  4 ft 8 in.feet deep depending upon activity.  Pt entered and exited the pool via stair and handrails   Pain level 5/10 at beginning of session.  At end 3/10 Aquatic Therapy:  Water walking for warm up fwd/lat with aqua jogger  Standing:all exercise using aquatic fins for added resistance  Standing march - 3x10 ea Hamstring curl 2x10 ea BIL Hip abd/add 2x10 BIL Hip Circles CC/CCW 2x10 each BIL Heel raises - 3x20 Hip ext/flex with knee straight x 20 BIL Mini squats x20 3 way hip kicks on RT and on LT 15 x  Sitting on bench in water: no aquatic fins Bicycle kicks x1' Reverse bicycle kicks x1' Flutter kicks  x1' Scissor kicks x1' Seated dumbell push downs - rainbow - 2x10 Aqua stretch over LT hip /scar tissue massage over distended tissue/liquid pouch Ended session with water ambulation forward and backward without AD or aquatic flotation and increased stride length with aquatic fins bil Pt requires the buoyancy of water for active assisted exercises with buoyancy supported for strengthening and AROM exercises. Hydrostatic pressure also supports joints by unweighting joint load by at least 50 % in 3-4 feet depth water. 80% in chest to neck deep water. Water will provide assistance with movement using the current and laminar flow while the buoyancy reduces weight bearing. Pt requires the viscosity of the water for resistance with strengthening exercises.    [facility]:                                                DATE: 08/22/22 Therapeutic Exercise: Nustep L4 8 min Seated hamstring stretch 30s x2 Bridge with ball 15x2  Supine march alt. 15/15 x2 2# Supine hip fallouts Bil GTB 15x ea S/L clams L GTB 15x2 L hip flexor stretch over table 30s x2 FAQs with adduction 15x2 2# WS onto 6 in block with OH in walker 15/15 emphasize upright posture.         PATIENT EDUCATION:  Education details: Discussed eval findings, rehab rationale and POC and patient is in agreement  Person educated: Patient Education method: Explanation Education comprehension: verbalized understanding and needs further education   HOME EXERCISE PROGRAM: Access Code: [email or web address] URL: [email or web address] Date: 08/01/2022 Prepared by: Gustavus BryantJeffrey Ziemba   Exercises - Sit to Stand Without Arm Support  - 2 x daily - 5 x weekly - 1 sets - 5 reps - Supine Bridge  - 2 x daily - 5 x weekly - 1 sets - 10 reps - Standing Heel Raise with Support  - 2 x daily - 5 x weekly - 1 sets - 10 reps   ASSESSMENT:   CLINICAL IMPRESSION:   Gavin PoundDeborah tolerated session well with no adverse reaction.  She shows  significantly improved clearance with L LE leading with hurdle walking.  Balance progressing well.  Added some isolated machine strengthening with knee ext and HS curl machine.  May consider leg press if tolerated.     OBJECTIVE IMPAIRMENTS: Abnormal gait, decreased activity tolerance, decreased balance, decreased endurance, decreased mobility, difficulty walking, decreased ROM, decreased strength, impaired perceived  functional ability, and pain.    ACTIVITY LIMITATIONS: carrying, lifting, sitting, standing, squatting, sleeping, and stairs   PERSONAL FACTORS: Fitness, Past/current experiences, Time since onset of injury/illness/exacerbation, and 1 comorbidity: DM  are also affecting patient's functional outcome.    REHAB POTENTIAL: Good   CLINICAL DECISION MAKING: Evolving/moderate complexity   EVALUATION COMPLEXITY: Low     GOALS: Goals reviewed with patient? Yes   SHORT TERM GOALS: Target date: 08/22/2022   Patient to demonstrate independence in HEP Baseline:[id number] Goal status: Met   2.  2100ft ambulation with RW and SBA Baseline: 40ft with RW and SBA 09/19/22: 215 feet Mod I Goal status: MET      LONG TERM GOALS: Target date: 09/12/2022 (extended to 11/23/2022)     Increase FOTO score to 51 Baseline: 47 09/19/22: 42 Goal status: ONGOING   2.  Decrease worst pain to 3/10 Baseline: 7/10 09/19/22: 5/10 Goal status: ONGOING   3.  Increase RLE strength to 4/5 Baseline:  MMT Right eval Left eval 3/27  Hip flexion   4- 4  Hip extension   4-   Hip abduction   4-   Hip adduction       Hip internal rotation       Hip external rotation       Knee flexion   4- 4  Knee extension   4- 4  Ankle dorsiflexion       Ankle plantarflexion   4- 4    Goal status: ONGOING   4.  Patient to ambulate 200 ft with LRAD Baseline: 33ft with RW 09/19/22: 215 ft with RW  Goal status: MET  5. Patient to ambulate 275 ft with LRAD in 2 MWT Baseline: 81ft with RW 09/19/22: 215 ft with RW   10/10/22: 176 feet with RW, seated rest required  Goal status: *NEW*       PLAN:   PT FREQUENCY: 2x/week   PT DURATION: 8 weeks (11/23/2022)   PLANNED INTERVENTIONS: Therapeutic exercises, Therapeutic activity, Neuromuscular re-education, Balance training, Gait training, Patient/Family education, Self Care, Joint mobilization, Stair training, DME instructions, Aquatic Therapy, and Re-evaluation   PLAN FOR NEXT SESSION: RE-eval: HEP review and update, aerobic tasks, LE strengthening, gait training, balance/proprioceptive work      Fredderick Phenix PT 10/13/22 11:28 AM Phone: [phone] Fax: [phone]

## 2022-10-14 ENCOUNTER — Ambulatory Visit (INDEPENDENT_AMBULATORY_CARE_PROVIDER_SITE_OTHER): Payer: 59 | Admitting: Student

## 2022-10-14 ENCOUNTER — Encounter: Payer: Self-pay | Admitting: Student

## 2022-10-14 VITALS — BP 138/66 | HR 90 | Ht 62.0 in | Wt 261.2 lb

## 2022-10-14 DIAGNOSIS — E119 Type 2 diabetes mellitus without complications: Secondary | ICD-10-CM | POA: Diagnosis not present

## 2022-10-14 DIAGNOSIS — E1169 Type 2 diabetes mellitus with other specified complication: Secondary | ICD-10-CM | POA: Diagnosis not present

## 2022-10-14 DIAGNOSIS — R239 Unspecified skin changes: Secondary | ICD-10-CM

## 2022-10-14 DIAGNOSIS — E669 Obesity, unspecified: Secondary | ICD-10-CM

## 2022-10-14 DIAGNOSIS — I10 Essential (primary) hypertension: Secondary | ICD-10-CM

## 2022-10-14 LAB — POCT GLYCOSYLATED HEMOGLOBIN (HGB A1C): HbA1c, POC (controlled diabetic range): 6 % (ref 0.0–7.0)

## 2022-10-14 NOTE — Progress Notes (Unsigned)
  SUBJECTIVE:   CHIEF COMPLAINT / HPI:   Type 2 Diabetes: Home medications include: Metformin 1000 BID .   Most recent A1Cs:  Lab Results  Component Value Date   HGBA1C 6.0 10/14/2022   HGBA1C 6.0 07/19/2022   Last Microalbumin, LDL, Creatinine: Lab Results  Component Value Date   MICROALBUR 30 10/30/2019   LDLCALC 61 07/19/2022   CREATININE 0.96 07/19/2022    Patient is up to date on diabetic eye-went previously and got glasses Patient is not up to date on diabetic foot exam.   HTN:  HCTZ 25 mg, Metoprolol 25 mg BID, stable     Recurrent Prosthetic Hip Infection  S/p reimplantation for infection of her left hip back in early November with Dr. Brandy Hale. She was on antibiotics postoperatively specifically doxycycline for a total of 6 months, is seen through [facility].  Patient has small area of fluid collection over the hip that she is concerned about. She saw Ortho and this is why they kept her on Doxy x 6 months.   PERTINENT  PMH / PSH: renal sclerosis?, HTN, GERD, DM2  Patient Care Team: Alfredo Martinez, MD as PCP - General (Family Medicine) OBJECTIVE:  BP 138/66   Pulse 90   Ht 5\' 2"  (1.575 m)   Wt 261 lb 3.2 oz (118.5 kg)   SpO2 99%   BMI 47.77 kg/m  Physical Exam  General: Alert and oriented in no apparent distress; uses walker for ambulation  Heart: Regular rate and rhythm with no murmurs appreciated Lungs: CTA bilaterally, no wheezing Abdomen: no abdominal pain Skin: Warm and dry, small area of fluctuation, well circumscribed over left hip with slight swelling above area, approx 1 cm length. No warmth erythema or drainage.  Extremities: No lower extremity edema  Diabetic Foot Exam - Simple   Simple Foot Form Visual Inspection No deformities, no ulcerations, no other skin breakdown bilaterally: Yes Sensation Testing Intact to touch and monofilament testing bilaterally: Yes Pulse Check Posterior Tibialis and Dorsalis pulse intact bilaterally:  Yes Comments       ASSESSMENT/PLAN:  Type 2 diabetes mellitus without complication, without long-term current use of insulin  Recent skin changes Assessment & Plan: Unknown cause, likely inflammatory process in setting of multiple hip surgeries.  No evidence of infection without systemic symptoms or concerns on PE.  Discussed following size and change of the area, may need U/S if changes are noted.  Return precautions discussed  Doxy per ortho Follow up 1 month.    HYPERTENSION, BENIGN SYSTEMIC Assessment & Plan: Stable, continue current regimen   Type 2 diabetes mellitus with obesity Assessment & Plan: well controlled - Last A1c:  Lab Results  Component Value Date   HGBA1C 6.0 10/14/2022   - Medications: metformin - Compliance: good -foot exam performed -unable to urinate for UACr, needs next visit   Orders: -     POCT glycosylated hemoglobin (Hb A1C)  Slight tachycardia in room on initial VS, recheck by me manually during PE normal and documented    Return in about 4 weeks (around 11/11/2022) for urine AUCr, recheck hip abscess?Alfredo Martinez, MD 10/15/2022, 12:40 PM PGY-2, [name]

## 2022-10-14 NOTE — Patient Instructions (Addendum)
It was great to see you today! Thank you for choosing [facility] for your primary care. Kristin [name] was seen for follow up.  Today we addressed: Keep taking your metformin as instructed  Return if you experience fevers, chills, changes in the area Follow up with ortho in June  Return to me in 1 month or earlier if symptoms worsen  If you haven't already, sign up for My Chart to have easy access to your labs results, and communication with your primary care physician.  I recommend that you always bring your medications to each appointment as this makes it easy to ensure you are on the correct medications and helps [place] not miss refills when you need them. Call the clinic at [phone] if your symptoms worsen or you have any concerns.  You should return to our clinic Return in about 4 weeks (around 11/11/2022) for urine AUCr, recheck hip abscess?. Please arrive 15 minutes before your appointment to ensure smooth check in process.  We appreciate your efforts in making this happen.  Thank you for allowing me to participate in your care, Alfredo Martinez, MD 10/14/2022, 10:11 AM PGY-2, [facility]

## 2022-10-15 ENCOUNTER — Encounter: Payer: Self-pay | Admitting: Student

## 2022-10-15 DIAGNOSIS — R239 Unspecified skin changes: Secondary | ICD-10-CM | POA: Insufficient documentation

## 2022-10-15 NOTE — Assessment & Plan Note (Signed)
" >>  ASSESSMENT AND PLAN FOR HYPERTENSION, BENIGN SYSTEMIC WRITTEN ON 10/15/2022 12:37 PM BY Herbie Lehrmann, MD  Stable, continue current regimen "

## 2022-10-15 NOTE — Assessment & Plan Note (Signed)
Stable, continue current regimen 

## 2022-10-15 NOTE — Assessment & Plan Note (Signed)
Unknown cause, likely inflammatory process in setting of multiple hip surgeries.  No evidence of infection without systemic symptoms or concerns on PE.  Discussed following size and change of the area, may need U/S if changes are noted.  Return precautions discussed  Doxy per ortho Follow up 1 month.

## 2022-10-15 NOTE — Assessment & Plan Note (Signed)
well controlled - Last A1c:  Lab Results  Component Value Date   HGBA1C 6.0 10/14/2022   - Medications: metformin - Compliance: good -foot exam performed -unable to urinate for UACr, needs next visit

## 2022-10-21 ENCOUNTER — Ambulatory Visit: Payer: 59 | Admitting: Physical Therapy

## 2022-10-21 ENCOUNTER — Telehealth: Payer: Self-pay | Admitting: Physical Therapy

## 2022-10-21 NOTE — Telephone Encounter (Signed)
Patient contacted clinic after appointment time to inform clinic that both hips are hurting and that is why she did not come to her appointment.

## 2022-10-25 ENCOUNTER — Encounter: Payer: Self-pay | Admitting: Physical Therapy

## 2022-10-25 ENCOUNTER — Ambulatory Visit: Payer: 59 | Admitting: Physical Therapy

## 2022-10-25 DIAGNOSIS — M25552 Pain in left hip: Secondary | ICD-10-CM

## 2022-10-25 DIAGNOSIS — M6281 Muscle weakness (generalized): Secondary | ICD-10-CM

## 2022-10-25 DIAGNOSIS — Z96649 Presence of unspecified artificial hip joint: Secondary | ICD-10-CM

## 2022-10-25 NOTE — Therapy (Signed)
Daily Note     Patient Name: Kristin [name] MRN: [id number] DOB:1956/02/17, 67 y.o., female Today's Date: 10/25/2022  PCP: Alfredo Martinez, MD    REFERRING PROVIDER: Daneen Schick, MD  END OF SESSION:   PT End of Session - 10/25/22 1044     Visit Number 15    Date for PT Re-Evaluation 11/23/22    Authorization Type [employer] MCR    PT Start Time 1045    PT Stop Time 1126    PT Time Calculation (min) 41 min               Past Medical History:  Diagnosis Date   Allergy    seasonal   Anemia    Anxiety    Arthritis    Back pain, chronic    Borderline diabetes    Bronchitis    Constipation    current issue   Cough    Depression    Epilepsy    GERD (gastroesophageal reflux disease)    Hyperlipidemia    Hypertension    IBS (irritable bowel syndrome)    Joint pain    Obesity    Palpitations    Pneumonia 2016   Pre-diabetes    Renal sclerosis, unspecified    only has 1 kidney   Rheumatoid arthritis    Seizures    last >15 +  yrs ago- recorded 07-26-2021   Somatization disorder    Tobacco abuse    Past Surgical History:  Procedure Laterality Date   ABDOMINAL HYSTERECTOMY     ANTERIOR HIP REVISION Left 07/25/2016   Procedure: LEFT HIP IRRIGATION AND DEBRIDEMENT, REVISION OF HEAD AND LINER;  Surgeon: Tarry Kos, MD;  Location: [facility];  Service: Orthopedics;  Laterality: Left;   APPLICATION OF WOUND VAC Left 12/23/2019   Procedure: APPLICATION OF WOUND VAC;  Surgeon: Tarry Kos, MD;  Location: [facility];  Service: Orthopedics;  Laterality: Left;   BREAST EXCISIONAL BIOPSY Right    CHOLECYSTECTOMY     COLONOSCOPY     DEBRIDEMENT AND CLOSURE WOUND Left 07/17/2019   Procedure: Excision of left leg wound with ACell placement and primary closure;  Surgeon: Peggye Form, DO;  Location: [facility];  Service: Plastics;  Laterality: Left;  60 min   EXCISIONAL TOTAL HIP ARTHROPLASTY WITH ANTIBIOTIC SPACERS Left 12/23/2019   Procedure: LEFT HIP  IRRIGATION AND DEBRIDEMENT LEFT HIP WOUND and poly exchange ;  Surgeon: Tarry Kos, MD;  Location: [facility];  Service: Orthopedics;  Laterality: Left;   HIP DEBRIDEMENT Left 01/19/2018   Procedure(s) Performed: IRRIGATION AND DEBRIDEMENT LEFT HIP (Left )   INCISION AND DRAINAGE HIP Left 08/03/2016   Procedure: IRRIGATION AND DEBRIDEMENT LEFT HIP, VAC PLACEMENT;  Surgeon: Tarry Kos, MD;  Location: [facility];  Service: Orthopedics;  Laterality: Left;   INCISION AND DRAINAGE HIP Left 01/19/2018   Procedure: IRRIGATION AND DEBRIDEMENT LEFT HIP;  Surgeon: Tarry Kos, MD;  Location: [facility];  Service: Orthopedics;  Laterality: Left;   kidney stent Right 2002   TONSILLECTOMY Bilateral 12/06/2014   Procedure: TONSILLECTOMY, BILATERAL;  Surgeon: Melvenia Beam, MD;  Location: [facility] OR;  Service: ENT;  Laterality: Bilateral;   TONSILLECTOMY     TOTAL HIP ARTHROPLASTY Left 06/01/2016   Procedure: LEFT TOTAL HIP ARTHROPLASTY ANTERIOR APPROACH;  Surgeon: Tarry Kos, MD;  Location: [facility];  Service: Orthopedics;  Laterality: Left;   TUBAL LIGATION     WOUND EXPLORATION Left 01/19/2018  Procedure: WOUND EXPLORATION;  Surgeon: Tarry Kos, MD;  Location: [facility] OR;  Service: Orthopedics;  Laterality: Left;   Patient Active Problem List   Diagnosis Date Noted   Recent skin changes 10/15/2022   Urinary retention 12/13/2021   Postoperative anemia due to acute blood loss 12/10/2021   Presence of left artificial hip joint 12/10/2021   Type 2 diabetes mellitus with obesity 12/09/2021   Infection and inflammatory reaction due to other internal joint prosthesis, initial encounter 12/09/2021   Preop examination 12/09/2021   HTN (hypertension) 12/08/2021   Arthritis 11/26/2021   Seizures 11/26/2021   Rash 09/18/2020   Worsening headaches 01/21/2020   Pruritus 10/30/2019   Hypercalcemia 10/24/2019   Body mass index 45.0-49.9, adult 08/27/2019   Intertrigo 07/09/2019   Facial rash 07/27/2018   Body mass index  40.0-44.9, adult 02/15/2018   Fatigue associated with anemia 02/07/2018   Unspecified open wound, left hip, initial encounter 01/19/2018   Wound of left leg, sequela 10/03/2016   Prosthetic joint infection of left hip 08/24/2016   Status post left hip replacement 06/01/2016   Sinus tachycardia 05/19/2016   History of adenomatous polyp of colon 03/21/2016   Diabetes 01/21/2016   Thyroid nodule 12/25/2014   Infiltrate noted on imaging study    Cervical spine arthritis 11/14/2014   Primary osteoarthritis of left hip 11/14/2014   Loss of consciousness 11/14/2014   Chest pain 03/26/2014   Musculoskeletal chest and rib pain 03/26/2014   Rib pain on right side 02/28/2014   Groin pain 04/18/2013   UNSPECIFIED RENAL SCLEROSIS 01/16/2009   GERD, SEVERE 05/25/2007   Morbid obesity (HCC) 04/03/2007   Dyslipidemia 08/31/2006   Major depression, recurrent 08/31/2006   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   Former smoker 08/31/2006   HYPERTENSION, BENIGN SYSTEMIC 08/31/2006   GASTRIC ULCER ACUTE WITHOUT HEMORRHAGE 08/31/2006   CONVULSIONS, SEIZURES, NOS 08/31/2006    REFERRING DIAG: M57.846 (ICD-10-CM) - Presence of left artificial hip joint  THERAPY DIAG: Presence of left artificial hip joint   Rationale for Evaluation and Treatment Rehabilitation  PERTINENT HISTORY: Patient ID: Kristin [name] is a 67 y.o. female.  Chief Complaint: Follow-up of the Left Hip  Months out from reimplantation over left total hip for chronic periprosthetic joint infection. Otherwise she is doing well. She rates her pain as a 3/10. She has been partial weight-bearing. She has had some accumulation of her seroma since removal of her drains. She has been off all of her antibiotics. Cultures were negative at the time of surgery   PRECAUTIONS:  posterior hip precautions   WEIGHT BEARING RESTRICTIONS: Yes WBAT      SUBJECTIVE:  SUBJECTIVE STATEMENT: Pt states that she has had R>L hip pain recently.  She thinks this may be d/t her using a lower toilet and sitting on her couch.      PAIN:  Are you having pain? Yes: NPRS scale: 3/10 left, right hip 0/10 Pain location: L hip Pain description: ache Aggravating factors: walking Relieving factors: rest and meds   OBJECTIVE: (objective measures completed at initial evaluation unless otherwise dated)   DIAGNOSTIC FINDINGS: none noted   PATIENT SURVEYS:  FOTO 47(51 predicted) FOTO 42 (09/19/22)   COGNITION: Overall cognitive status: Within functional limits for tasks assessed                         SENSATION: Not tested   MUSCLE LENGTH: Hamstrings: Right 80 deg; Left 80 deg   POSTURE:  not assessed   PALPATION: deferred   LOWER EXTREMITY ROM:   Active ROM Right eval Left eval Left 10/10/22  Hip flexion 110 85 95  Hip extension       Hip abduction       Hip adduction       Hip internal rotation       Hip external rotation       Knee flexion [facility] WFL   Knee extension [facility]   Ankle dorsiflexion       Ankle plantarflexion       Ankle inversion       Ankle eversion        (Blank rows = not tested)   LOWER EXTREMITY MMT:   MMT Right eval Left eval 3/27 See goal section  Hip flexion   4-   Hip extension   4-   Hip abduction   4-   Hip adduction       Hip internal rotation       Hip external rotation       Knee flexion   4-   Knee extension   4-   Ankle dorsiflexion       Ankle plantarflexion   4-   Ankle inversion       Ankle eversion        (Blank rows = not tested)   LOWER EXTREMITY SPECIAL TESTS:  Hip special tests: ITB negative    FUNCTIONAL TESTS: 30s chair stand 6 reps arms crossed 09/19/22: 2 MWT 215 feet  ( 1 standing rest break)      GAIT: Distance walked: 6ft  Assistive device utilized: Environmental consultant - 2 wheeled Level of  assistance: SBA Comments: slow cadence     TODAY'S TREATMENT:    [facility] Adult PT Treatment: Therapeutic Exercise: Nustep L6 6 min LTR - 20x Legs apart alternating Alternating clams - Black TB - 2x10 Staggered bridge - 2x10 - small arc Walking SPC - 90' - d/c d/t R hip pain Leg press - seat 8 - 20# - 4x10  Neuromuscular re-ed (not completed today): Narrow stance with head turns, head nods Staggered stance on Airex 45 second bouts Rocker board DF/PF - blue    PATIENT EDUCATION:  Education details: Discussed eval findings, rehab rationale and POC and patient is in agreement  Person educated: Patient Education method: Explanation Education comprehension: verbalized understanding and needs further education   HOME EXERCISE PROGRAM: Access Code: [email or web address] URL: [email or web address] Date: 08/01/2022 Prepared by: Gustavus Bryant   Exercises - Sit to Stand Without Arm Support  - 2 x daily - 5 x weekly - 1 sets - 5 reps -  Supine Bridge  - 2 x daily - 5 x weekly - 1 sets - 10 reps - Standing Heel Raise with Support  - 2 x daily - 5 x weekly - 1 sets - 10 reps   ASSESSMENT:   CLINICAL IMPRESSION:   Kadeidra tolerated session well with no adverse reaction.  Pt is more limited by R > L hip pain.  We trial SPC but she shows decreased time in stance R>L and increased R hip pain.  I recommended she concentrate on smooth gait with reduced UE w/b wit walker for now.    OBJECTIVE IMPAIRMENTS: Abnormal gait, decreased activity tolerance, decreased balance, decreased endurance, decreased mobility, difficulty walking, decreased ROM, decreased strength, impaired perceived functional ability, and pain.    ACTIVITY LIMITATIONS: carrying, lifting, sitting, standing, squatting, sleeping, and stairs   PERSONAL FACTORS: Fitness, Past/current experiences, Time since onset of injury/illness/exacerbation, and 1 comorbidity: DM  are also affecting patient's functional outcome.    REHAB  POTENTIAL: Good   CLINICAL DECISION MAKING: Evolving/moderate complexity   EVALUATION COMPLEXITY: Low     GOALS: Goals reviewed with patient? Yes   SHORT TERM GOALS: Target date: 08/22/2022   Patient to demonstrate independence in HEP Baseline:[id number] Goal status: Met   2.  226ft ambulation with RW and SBA Baseline: 41ft with RW and SBA 09/19/22: 215 feet Mod I Goal status: MET      LONG TERM GOALS: Target date: 09/12/2022 (extended to 11/23/2022)     Increase FOTO score to 51 Baseline: 47 09/19/22: 42 Goal status: ONGOING   2.  Decrease worst pain to 3/10 Baseline: 7/10 09/19/22: 5/10 Goal status: ONGOING   3.  Increase RLE strength to 4/5 Baseline:  MMT Right eval Left eval 3/27  Hip flexion   4- 4  Hip extension   4-   Hip abduction   4-   Hip adduction       Hip internal rotation       Hip external rotation       Knee flexion   4- 4  Knee extension   4- 4  Ankle dorsiflexion       Ankle plantarflexion   4- 4    Goal status: ONGOING   4.  Patient to ambulate 200 ft with LRAD Baseline: 8ft with RW 09/19/22: 215 ft with RW  Goal status: MET  5. Patient to ambulate 275 ft with LRAD in 2 MWT Baseline: 33ft with RW 09/19/22: 215 ft with RW  10/10/22: 176 feet with RW, seated rest required  Goal status: *NEW*       PLAN:   PT FREQUENCY: 2x/week   PT DURATION: 8 weeks (11/23/2022)   PLANNED INTERVENTIONS: Therapeutic exercises, Therapeutic activity, Neuromuscular re-education, Balance training, Gait training, Patient/Family education, Self Care, Joint mobilization, Stair training, DME instructions, Aquatic Therapy, and Re-evaluation   PLAN FOR NEXT SESSION: RE-eval: HEP review and update, aerobic tasks, LE strengthening, gait training, balance/proprioceptive work      Fredderick Phenix PT 10/25/22 11:29 AM Phone: [phone] Fax: [phone]

## 2022-10-27 ENCOUNTER — Ambulatory Visit: Payer: 59 | Admitting: Physical Therapy

## 2022-10-27 ENCOUNTER — Encounter: Payer: Self-pay | Admitting: Physical Therapy

## 2022-10-27 DIAGNOSIS — M25552 Pain in left hip: Secondary | ICD-10-CM

## 2022-10-27 DIAGNOSIS — Z96649 Presence of unspecified artificial hip joint: Secondary | ICD-10-CM | POA: Diagnosis not present

## 2022-10-27 DIAGNOSIS — M6281 Muscle weakness (generalized): Secondary | ICD-10-CM

## 2022-10-27 NOTE — Therapy (Signed)
Daily Note     Patient Name: Kristin [name] MRN: [id number] DOB:08-Oct-1955, 67 y.o., female Today's Date: 10/27/2022  PCP: Alfredo Martinez, MD    REFERRING PROVIDER: Daneen Schick, MD  END OF SESSION:   PT End of Session - 10/27/22 1045     Visit Number 16    Date for PT Re-Evaluation 11/23/22    Authorization Type [employer] MCR    PT Start Time 1045    PT Stop Time 1126    PT Time Calculation (min) 41 min               Past Medical History:  Diagnosis Date   Allergy    seasonal   Anemia    Anxiety    Arthritis    Back pain, chronic    Borderline diabetes    Bronchitis    Constipation    current issue   Cough    Depression    Epilepsy    GERD (gastroesophageal reflux disease)    Hyperlipidemia    Hypertension    IBS (irritable bowel syndrome)    Joint pain    Obesity    Palpitations    Pneumonia 2016   Pre-diabetes    Renal sclerosis, unspecified    only has 1 kidney   Rheumatoid arthritis    Seizures    last >15 +  yrs ago- recorded 07-26-2021   Somatization disorder    Tobacco abuse    Past Surgical History:  Procedure Laterality Date   ABDOMINAL HYSTERECTOMY     ANTERIOR HIP REVISION Left 07/25/2016   Procedure: LEFT HIP IRRIGATION AND DEBRIDEMENT, REVISION OF HEAD AND LINER;  Surgeon: Tarry Kos, MD;  Location: [facility];  Service: Orthopedics;  Laterality: Left;   APPLICATION OF WOUND VAC Left 12/23/2019   Procedure: APPLICATION OF WOUND VAC;  Surgeon: Tarry Kos, MD;  Location: [facility];  Service: Orthopedics;  Laterality: Left;   BREAST EXCISIONAL BIOPSY Right    CHOLECYSTECTOMY     COLONOSCOPY     DEBRIDEMENT AND CLOSURE WOUND Left 07/17/2019   Procedure: Excision of left leg wound with ACell placement and primary closure;  Surgeon: Peggye Form, DO;  Location: [facility];  Service: Plastics;  Laterality: Left;  60 min   EXCISIONAL TOTAL HIP ARTHROPLASTY WITH ANTIBIOTIC SPACERS Left 12/23/2019   Procedure: LEFT HIP  IRRIGATION AND DEBRIDEMENT LEFT HIP WOUND and poly exchange ;  Surgeon: Tarry Kos, MD;  Location: [facility];  Service: Orthopedics;  Laterality: Left;   HIP DEBRIDEMENT Left 01/19/2018   Procedure(s) Performed: IRRIGATION AND DEBRIDEMENT LEFT HIP (Left )   INCISION AND DRAINAGE HIP Left 08/03/2016   Procedure: IRRIGATION AND DEBRIDEMENT LEFT HIP, VAC PLACEMENT;  Surgeon: Tarry Kos, MD;  Location: [facility];  Service: Orthopedics;  Laterality: Left;   INCISION AND DRAINAGE HIP Left 01/19/2018   Procedure: IRRIGATION AND DEBRIDEMENT LEFT HIP;  Surgeon: Tarry Kos, MD;  Location: [facility];  Service: Orthopedics;  Laterality: Left;   kidney stent Right 2002   TONSILLECTOMY Bilateral 12/06/2014   Procedure: TONSILLECTOMY, BILATERAL;  Surgeon: Melvenia Beam, MD;  Location: [facility] OR;  Service: ENT;  Laterality: Bilateral;   TONSILLECTOMY     TOTAL HIP ARTHROPLASTY Left 06/01/2016   Procedure: LEFT TOTAL HIP ARTHROPLASTY ANTERIOR APPROACH;  Surgeon: Tarry Kos, MD;  Location: [facility];  Service: Orthopedics;  Laterality: Left;   TUBAL LIGATION     WOUND EXPLORATION Left 01/19/2018  Procedure: WOUND EXPLORATION;  Surgeon: Tarry Kos, MD;  Location: [facility] OR;  Service: Orthopedics;  Laterality: Left;   Patient Active Problem List   Diagnosis Date Noted   Recent skin changes 10/15/2022   Urinary retention 12/13/2021   Postoperative anemia due to acute blood loss 12/10/2021   Presence of left artificial hip joint 12/10/2021   Type 2 diabetes mellitus with obesity 12/09/2021   Infection and inflammatory reaction due to other internal joint prosthesis, initial encounter 12/09/2021   Preop examination 12/09/2021   HTN (hypertension) 12/08/2021   Arthritis 11/26/2021   Seizures 11/26/2021   Rash 09/18/2020   Worsening headaches 01/21/2020   Pruritus 10/30/2019   Hypercalcemia 10/24/2019   Body mass index 45.0-49.9, adult 08/27/2019   Intertrigo 07/09/2019   Facial rash 07/27/2018   Body mass index  40.0-44.9, adult 02/15/2018   Fatigue associated with anemia 02/07/2018   Unspecified open wound, left hip, initial encounter 01/19/2018   Wound of left leg, sequela 10/03/2016   Prosthetic joint infection of left hip 08/24/2016   Status post left hip replacement 06/01/2016   Sinus tachycardia 05/19/2016   History of adenomatous polyp of colon 03/21/2016   Diabetes 01/21/2016   Thyroid nodule 12/25/2014   Infiltrate noted on imaging study    Cervical spine arthritis 11/14/2014   Primary osteoarthritis of left hip 11/14/2014   Loss of consciousness 11/14/2014   Chest pain 03/26/2014   Musculoskeletal chest and rib pain 03/26/2014   Rib pain on right side 02/28/2014   Groin pain 04/18/2013   UNSPECIFIED RENAL SCLEROSIS 01/16/2009   GERD, SEVERE 05/25/2007   Morbid obesity (HCC) 04/03/2007   Dyslipidemia 08/31/2006   Major depression, recurrent 08/31/2006   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   Former smoker 08/31/2006   HYPERTENSION, BENIGN SYSTEMIC 08/31/2006   GASTRIC ULCER ACUTE WITHOUT HEMORRHAGE 08/31/2006   CONVULSIONS, SEIZURES, NOS 08/31/2006    REFERRING DIAG: M57.846 (ICD-10-CM) - Presence of left artificial hip joint  THERAPY DIAG: Presence of left artificial hip joint   Rationale for Evaluation and Treatment Rehabilitation  PERTINENT HISTORY: Patient ID: Kristin [name] is a 67 y.o. female.  Chief Complaint: Follow-up of the Left Hip  Months out from reimplantation over left total hip for chronic periprosthetic joint infection. Otherwise she is doing well. She rates her pain as a 3/10. She has been partial weight-bearing. She has had some accumulation of her seroma since removal of her drains. She has been off all of her antibiotics. Cultures were negative at the time of surgery   PRECAUTIONS:  posterior hip precautions   WEIGHT BEARING RESTRICTIONS: Yes WBAT      SUBJECTIVE:  SUBJECTIVE STATEMENT:   Pt states that she is doing well today.  She has been working on walking upright more and using less UE support on her walker.      PAIN:  Are you having pain? Yes: NPRS scale: 3/10 left, right hip 0/10 Pain location: L hip Pain description: ache Aggravating factors: walking Relieving factors: rest and meds   OBJECTIVE: (objective measures completed at initial evaluation unless otherwise dated)   DIAGNOSTIC FINDINGS: none noted   PATIENT SURVEYS:  FOTO 47(51 predicted) FOTO 42 (09/19/22)   COGNITION: Overall cognitive status: Within functional limits for tasks assessed                         SENSATION: Not tested   MUSCLE LENGTH: Hamstrings: Right 80 deg; Left 80 deg   POSTURE:  not assessed   PALPATION: deferred   LOWER EXTREMITY ROM:   Active ROM Right eval Left eval Left 10/10/22  Hip flexion 110 85 95  Hip extension       Hip abduction       Hip adduction       Hip internal rotation       Hip external rotation       Knee flexion [facility] WFL   Knee extension [facility]   Ankle dorsiflexion       Ankle plantarflexion       Ankle inversion       Ankle eversion        (Blank rows = not tested)   LOWER EXTREMITY MMT:   MMT Right eval Left eval 3/27 See goal section  Hip flexion   4-   Hip extension   4-   Hip abduction   4-   Hip adduction       Hip internal rotation       Hip external rotation       Knee flexion   4-   Knee extension   4-   Ankle dorsiflexion       Ankle plantarflexion   4-   Ankle inversion       Ankle eversion        (Blank rows = not tested)   LOWER EXTREMITY SPECIAL TESTS:  Hip special tests: ITB negative    FUNCTIONAL TESTS: 30s chair stand 6 reps arms crossed 09/19/22: 2 MWT 215 feet  ( 1 standing rest break)      GAIT: Distance walked: 35ft  Assistive device utilized: Environmental consultant - 2 wheeled Level of  assistance: SBA Comments: slow cadence     TODAY'S TREATMENT:    [facility] Adult PT Treatment: Therapeutic Exercise: Nustep L6 6 min - LE only LTR - 20x Legs apart alternating Alternating clams - Black TB - 2x10 Staggered bridge - 2x10 - small arc Hurdle stepping - 2x10 Leg press - seat 8 - 40# - 2x10; seat 7 - 2x10; seat 7 50# - 2x10 Knee ext machine - 3x10 # Knee flexion machine - 3x10 #  Neuromuscular re-ed : Marching on foam with fingertip support - 3 bouts to fatigue Staggered stance on Airex 45 second bouts Rocker board DF/PF - wooden     PATIENT EDUCATION:  Education details: Discussed eval findings, rehab rationale and POC and patient is in agreement  Person educated: Patient Education method: Explanation Education comprehension: verbalized understanding and needs further education   HOME EXERCISE PROGRAM: Access Code: [email or web address] URL: [email or web address] Date: 08/01/2022 Prepared by: Gustavus Bryant   Exercises -  Sit to Stand Without Arm Support  - 2 x daily - 5 x weekly - 1 sets - 5 reps - Supine Bridge  - 2 x daily - 5 x weekly - 1 sets - 10 reps - Standing Heel Raise with Support  - 2 x daily - 5 x weekly - 1 sets - 10 reps   ASSESSMENT:   CLINICAL IMPRESSION:   Debbe tolerated session well with no adverse reaction.  Returned to proprioception training today combined with isolated LE strength training.  Pt requiring rest breaks during standing balance exercises d/t fatigue and increase in discomfort which resolves with rest.  Gait improved with less UE support required during ambulation.   OBJECTIVE IMPAIRMENTS: Abnormal gait, decreased activity tolerance, decreased balance, decreased endurance, decreased mobility, difficulty walking, decreased ROM, decreased strength, impaired perceived functional ability, and pain.    ACTIVITY LIMITATIONS: carrying, lifting, sitting, standing, squatting, sleeping, and stairs   PERSONAL FACTORS: Fitness,  Past/current experiences, Time since onset of injury/illness/exacerbation, and 1 comorbidity: DM  are also affecting patient's functional outcome.    REHAB POTENTIAL: Good   CLINICAL DECISION MAKING: Evolving/moderate complexity   EVALUATION COMPLEXITY: Low     GOALS: Goals reviewed with patient? Yes   SHORT TERM GOALS: Target date: 08/22/2022   Patient to demonstrate independence in HEP Baseline:[id number] Goal status: Met   2.  242ft ambulation with RW and SBA Baseline: 18ft with RW and SBA 09/19/22: 215 feet Mod I Goal status: MET      LONG TERM GOALS: Target date: 09/12/2022 (extended to 11/23/2022)     Increase FOTO score to 51 Baseline: 47 09/19/22: 42 Goal status: ONGOING   2.  Decrease worst pain to 3/10 Baseline: 7/10 09/19/22: 5/10 Goal status: ONGOING   3.  Increase RLE strength to 4/5 Baseline:  MMT Right eval Left eval 3/27  Hip flexion   4- 4  Hip extension   4-   Hip abduction   4-   Hip adduction       Hip internal rotation       Hip external rotation       Knee flexion   4- 4  Knee extension   4- 4  Ankle dorsiflexion       Ankle plantarflexion   4- 4    Goal status: ONGOING   4.  Patient to ambulate 200 ft with LRAD Baseline: 59ft with RW 09/19/22: 215 ft with RW  Goal status: MET  5. Patient to ambulate 275 ft with LRAD in 2 MWT Baseline: 22ft with RW 09/19/22: 215 ft with RW  10/10/22: 176 feet with RW, seated rest required  Goal status: *NEW*       PLAN:   PT FREQUENCY: 2x/week   PT DURATION: 8 weeks (11/23/2022)   PLANNED INTERVENTIONS: Therapeutic exercises, Therapeutic activity, Neuromuscular re-education, Balance training, Gait training, Patient/Family education, Self Care, Joint mobilization, Stair training, DME instructions, Aquatic Therapy, and Re-evaluation   PLAN FOR NEXT SESSION: RE-eval: HEP review and update, aerobic tasks, LE strengthening, gait training, balance/proprioceptive work      Fredderick Phenix  PT 10/27/22 11:27 AM Phone: [phone] Fax: [phone]

## 2022-10-30 NOTE — Therapy (Unsigned)
Daily Note     Patient Name: Kristin [name] MRN: [id number] DOB:Feb 27, 1956, 67 y.o., female Today's Date: 10/31/2022  PCP: Alfredo Martinez, MD    REFERRING PROVIDER: Daneen Schick, MD  END OF SESSION:       Past Medical History:  Diagnosis Date   Allergy    seasonal   Anemia    Anxiety    Arthritis    Back pain, chronic    Borderline diabetes    Bronchitis    Constipation    current issue   Cough    Depression    Epilepsy (HCC)    GERD (gastroesophageal reflux disease)    Hyperlipidemia    Hypertension    IBS (irritable bowel syndrome)    Joint pain    Obesity    Palpitations    Pneumonia 2016   Pre-diabetes    Renal sclerosis, unspecified    only has 1 kidney   Rheumatoid arthritis (HCC)    Seizures (HCC)    last >15 +  yrs ago- recorded 07-26-2021   Somatization disorder    Tobacco abuse    Past Surgical History:  Procedure Laterality Date   ABDOMINAL HYSTERECTOMY     ANTERIOR HIP REVISION Left 07/25/2016   Procedure: LEFT HIP IRRIGATION AND DEBRIDEMENT, REVISION OF HEAD AND LINER;  Surgeon: Tarry Kos, MD;  Location: [facility];  Service: Orthopedics;  Laterality: Left;   APPLICATION OF WOUND VAC Left 12/23/2019   Procedure: APPLICATION OF WOUND VAC;  Surgeon: Tarry Kos, MD;  Location: [facility];  Service: Orthopedics;  Laterality: Left;   BREAST EXCISIONAL BIOPSY Right    CHOLECYSTECTOMY     COLONOSCOPY     DEBRIDEMENT AND CLOSURE WOUND Left 07/17/2019   Procedure: Excision of left leg wound with ACell placement and primary closure;  Surgeon: Peggye Form, DO;  Location: [facility];  Service: Plastics;  Laterality: Left;  60 min   EXCISIONAL TOTAL HIP ARTHROPLASTY WITH ANTIBIOTIC SPACERS Left 12/23/2019   Procedure: LEFT HIP IRRIGATION AND DEBRIDEMENT LEFT HIP WOUND and poly exchange ;  Surgeon: Tarry Kos, MD;  Location: [facility];  Service: Orthopedics;  Laterality: Left;   HIP DEBRIDEMENT Left 01/19/2018   Procedure(s)  Performed: IRRIGATION AND DEBRIDEMENT LEFT HIP (Left )   INCISION AND DRAINAGE HIP Left 08/03/2016   Procedure: IRRIGATION AND DEBRIDEMENT LEFT HIP, VAC PLACEMENT;  Surgeon: Tarry Kos, MD;  Location: [facility];  Service: Orthopedics;  Laterality: Left;   INCISION AND DRAINAGE HIP Left 01/19/2018   Procedure: IRRIGATION AND DEBRIDEMENT LEFT HIP;  Surgeon: Tarry Kos, MD;  Location: [facility];  Service: Orthopedics;  Laterality: Left;   kidney stent Right 2002   TONSILLECTOMY Bilateral 12/06/2014   Procedure: TONSILLECTOMY, BILATERAL;  Surgeon: Melvenia Beam, MD;  Location: [facility] OR;  Service: ENT;  Laterality: Bilateral;   TONSILLECTOMY     TOTAL HIP ARTHROPLASTY Left 06/01/2016   Procedure: LEFT TOTAL HIP ARTHROPLASTY ANTERIOR APPROACH;  Surgeon: Tarry Kos, MD;  Location: [facility];  Service: Orthopedics;  Laterality: Left;   TUBAL LIGATION     WOUND EXPLORATION Left 01/19/2018   Procedure: WOUND EXPLORATION;  Surgeon: Tarry Kos, MD;  Location: [facility];  Service: Orthopedics;  Laterality: Left;   Patient Active Problem List   Diagnosis Date Noted   Recent skin changes 10/15/2022   Urinary retention 12/13/2021   Postoperative anemia due to acute blood loss 12/10/2021   Presence of left artificial hip joint  12/10/2021   Type 2 diabetes mellitus with obesity (HCC) 12/09/2021   Infection and inflammatory reaction due to other internal joint prosthesis, initial encounter (HCC) 12/09/2021   Preop examination 12/09/2021   HTN (hypertension) 12/08/2021   Arthritis 11/26/2021   Seizures (HCC) 11/26/2021   Rash 09/18/2020   Worsening headaches 01/21/2020   Pruritus 10/30/2019   Hypercalcemia 10/24/2019   Body mass index 45.0-49.9, adult (HCC) 08/27/2019   Intertrigo 07/09/2019   Facial rash 07/27/2018   Body mass index 40.0-44.9, adult (HCC) 02/15/2018   Fatigue associated with anemia 02/07/2018   Unspecified open wound, left hip, initial encounter 01/19/2018   Wound of left leg, sequela 10/03/2016    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HCC) 08/24/2016   Status post left hip replacement 06/01/2016   Sinus tachycardia 05/19/2016   History of adenomatous polyp of colon 03/21/2016   Diabetes (HCC) 01/21/2016   Thyroid nodule 12/25/2014   Infiltrate noted on imaging study    Cervical spine arthritis 11/14/2014   Primary osteoarthritis of left hip 11/14/2014   Loss of consciousness (HCC) 11/14/2014   Chest pain 03/26/2014   Musculoskeletal chest and rib pain 03/26/2014   Rib pain on right side 02/28/2014   Groin pain 04/18/2013   UNSPECIFIED RENAL SCLEROSIS 01/16/2009   GERD, SEVERE 05/25/2007   Morbid obesity (HCC) 04/03/2007   Dyslipidemia 08/31/2006   Major depression, recurrent (HCC) 08/31/2006   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   Former smoker 08/31/2006   HYPERTENSION, BENIGN SYSTEMIC 08/31/2006   GASTRIC ULCER ACUTE WITHOUT HEMORRHAGE 08/31/2006   CONVULSIONS, SEIZURES, NOS 08/31/2006    REFERRING DIAG: O96.295 (ICD-10-CM) - Presence of left artificial hip joint  THERAPY DIAG: Presence of left artificial hip joint   Rationale for Evaluation and Treatment Rehabilitation  PERTINENT HISTORY: Patient ID: DREAM HARMAN is a 67 y.o. female.  Chief Complaint: Follow-up of the Left Hip  Months out from reimplantation over left total hip for chronic periprosthetic joint infection. Otherwise she is doing well. She rates her pain as a 3/10. She has been partial weight-bearing. She has had some accumulation of her seroma since removal of her drains. She has been off all of her antibiotics. Cultures were negative at the time of surgery   PRECAUTIONS:  posterior hip precautions   WEIGHT BEARING RESTRICTIONS: Yes WBAT      SUBJECTIVE:                                                                                                                                                                                      SUBJECTIVE STATEMENT: L hip pain minimal, main concern is  weakness which limits her ability to  advance off of walker to cane.       PAIN:  Are you having pain? Yes: NPRS scale: 3/10 left, right hip 0/10 Pain location: L hip Pain description: ache Aggravating factors: walking Relieving factors: rest and meds   OBJECTIVE: (objective measures completed at initial evaluation unless otherwise dated)   DIAGNOSTIC FINDINGS: none noted   PATIENT SURVEYS:  FOTO 47(51 predicted) FOTO 42 (09/19/22)   COGNITION: Overall cognitive status: Within functional limits for tasks assessed                         SENSATION: Not tested   MUSCLE LENGTH: Hamstrings: Right 80 deg; Left 80 deg   POSTURE:  not assessed   PALPATION: deferred   LOWER EXTREMITY ROM:   Active ROM Right eval Left eval Left 10/10/22  Hip flexion 110 85 95  Hip extension       Hip abduction       Hip adduction       Hip internal rotation       Hip external rotation       Knee flexion [facility] WFL   Knee extension [facility]   Ankle dorsiflexion       Ankle plantarflexion       Ankle inversion       Ankle eversion        (Blank rows = not tested)   LOWER EXTREMITY MMT:   MMT Right eval Left eval 3/27 See goal section  Hip flexion   4-   Hip extension   4-   Hip abduction   4-   Hip adduction       Hip internal rotation       Hip external rotation       Knee flexion   4-   Knee extension   4-   Ankle dorsiflexion       Ankle plantarflexion   4-   Ankle inversion       Ankle eversion        (Blank rows = not tested)   LOWER EXTREMITY SPECIAL TESTS:  Hip special tests: ITB negative    FUNCTIONAL TESTS: 30s chair stand 6 reps arms crossed 09/19/22: 2 MWT 215 feet  ( 1 standing rest break)      GAIT: Distance walked: 30ft  Assistive device utilized: Environmental consultant - 2 wheeled Level of assistance: SBA Comments: slow cadence     TODAY'S TREATMENT:   [facility] Adult PT Treatment:                                                DATE: 10/31/22 Therapeutic  Exercise: Nustep L4 8 min LE only L SLR 2# 15x Supine march 15/15 2# LLE Bridge 15x Supine hip fallouts BluTB 15x B, 15/15 unilaterally L clam in R S/L BluTB 15x2 L hip flexor stretch 30s x2 STS with OH reach 10x Sidestepping at counter, 3 trips no UEsupport   [facility] Adult PT Treatment: Therapeutic Exercise: Nustep L6 6 min - LE only LTR - 20x Legs apart alternating Alternating clams - Black TB - 2x10 Staggered bridge - 2x10 - small arc Hurdle stepping - 2x10 Leg press - seat 8 - 40# - 2x10; seat 7 - 2x10; seat 7 50# - 2x10 Knee ext machine - 3x10 @15 # Knee flexion machine - 3x10 @  25#  Neuromuscular re-ed : Marching on foam with fingertip support - 3 bouts to fatigue Staggered stance on Airex 45 second bouts Rocker board DF/PF - wooden     PATIENT EDUCATION:  Education details: Discussed eval findings, rehab rationale and POC and patient is in agreement  Person educated: Patient Education method: Explanation Education comprehension: verbalized understanding and needs further education   HOME EXERCISE PROGRAM: Access Code: [email or web address] URL: [email or web address] Date: 08/01/2022 Prepared by: Gustavus Bryant   Exercises - Sit to Stand Without Arm Support  - 2 x daily - 5 x weekly - 1 sets - 5 reps - Supine Bridge  - 2 x daily - 5 x weekly - 1 sets - 10 reps - Standing Heel Raise with Support  - 2 x daily - 5 x weekly - 1 sets - 10 reps   ASSESSMENT:   CLINICAL IMPRESSION: Todays session focused on L hip strengthening, stretching and functional strengthening.  Added time on aerobic component, increased resistance and weight as noted.  Advanced to unsupported sidestepping at counter to challenge and assess balance.  OBJECTIVE IMPAIRMENTS: Abnormal gait, decreased activity tolerance, decreased balance, decreased endurance, decreased mobility, difficulty walking, decreased ROM, decreased strength, impaired perceived functional ability, and pain.    ACTIVITY  LIMITATIONS: carrying, lifting, sitting, standing, squatting, sleeping, and stairs   PERSONAL FACTORS: Fitness, Past/current experiences, Time since onset of injury/illness/exacerbation, and 1 comorbidity: DM  are also affecting patient's functional outcome.    REHAB POTENTIAL: Good   CLINICAL DECISION MAKING: Evolving/moderate complexity   EVALUATION COMPLEXITY: Low     GOALS: Goals reviewed with patient? Yes   SHORT TERM GOALS: Target date: 08/22/2022   Patient to demonstrate independence in HEP Baseline:[id number] Goal status: Met   2.  22ft ambulation with RW and SBA Baseline: 42ft with RW and SBA 09/19/22: 215 feet Mod I Goal status: MET      LONG TERM GOALS: Target date: 09/12/2022 (extended to 11/23/2022)     Increase FOTO score to 51 Baseline: 47 09/19/22: 42 Goal status: ONGOING   2.  Decrease worst pain to 3/10 Baseline: 7/10 09/19/22: 5/10; 10/31/22 3/10 Goal status: Met   3.  Increase RLE strength to 4/5 Baseline:  MMT Right eval Left eval 3/27  Hip flexion   4- 4  Hip extension   4-   Hip abduction   4-   Hip adduction       Hip internal rotation       Hip external rotation       Knee flexion   4- 4  Knee extension   4- 4  Ankle dorsiflexion       Ankle plantarflexion   4- 4    Goal status: ONGOING   4.  Patient to ambulate 200 ft with LRAD Baseline: 82ft with RW 09/19/22: 215 ft with RW  Goal status: MET  5. Patient to ambulate 275 ft with LRAD in 2 MWT Baseline: 43ft with RW 09/19/22: 215 ft with RW  10/10/22: 176 feet with RW, seated rest required  Goal status: *NEW*       PLAN:   PT FREQUENCY: 2x/week   PT DURATION: 8 weeks (11/23/2022)   PLANNED INTERVENTIONS: Therapeutic exercises, Therapeutic activity, Neuromuscular re-education, Balance training, Gait training, Patient/Family education, Self Care, Joint mobilization, Stair training, DME instructions, Aquatic Therapy, and Re-evaluation   PLAN FOR NEXT SESSION: RE-eval: HEP review  and update, aerobic tasks, LE strengthening, gait training, balance/proprioceptive work      Farris Has  Zaydah Nawabi PT 10/31/22 12:45 PM Phone: [phone] Fax: [phone]

## 2022-10-31 ENCOUNTER — Ambulatory Visit: Payer: 59

## 2022-10-31 DIAGNOSIS — M6281 Muscle weakness (generalized): Secondary | ICD-10-CM

## 2022-10-31 DIAGNOSIS — Z96649 Presence of unspecified artificial hip joint: Secondary | ICD-10-CM | POA: Diagnosis not present

## 2022-10-31 DIAGNOSIS — M25552 Pain in left hip: Secondary | ICD-10-CM

## 2022-11-03 ENCOUNTER — Ambulatory Visit: Payer: 59 | Attending: Orthopaedic Surgery | Admitting: Physical Therapy

## 2022-11-03 ENCOUNTER — Encounter: Payer: Self-pay | Admitting: Physical Therapy

## 2022-11-03 DIAGNOSIS — Z96649 Presence of unspecified artificial hip joint: Secondary | ICD-10-CM | POA: Insufficient documentation

## 2022-11-03 DIAGNOSIS — M6281 Muscle weakness (generalized): Secondary | ICD-10-CM | POA: Diagnosis present

## 2022-11-03 DIAGNOSIS — M25552 Pain in left hip: Secondary | ICD-10-CM | POA: Diagnosis present

## 2022-11-03 NOTE — Therapy (Signed)
Daily Note     Patient Name: Kristin [name] MRN: [id number] DOB:01-18-1956, 67 y.o., female Today's Date: 11/03/2022  PCP: Kristin Martinez, MD    REFERRING PROVIDER: Daneen Schick, MD  END OF SESSION:   PT End of Session - 11/03/22 1013     Visit Number 18    Date for PT Re-Evaluation 11/23/22    Authorization Type [employer] MCR    PT Start Time 1015    PT Stop Time 1100    PT Time Calculation (min) 45 min                Past Medical History:  Diagnosis Date   Allergy    seasonal   Anemia    Anxiety    Arthritis    Back pain, chronic    Borderline diabetes    Bronchitis    Constipation    current issue   Cough    Depression    Epilepsy (HCC)    GERD (gastroesophageal reflux disease)    Hyperlipidemia    Hypertension    IBS (irritable bowel syndrome)    Joint pain    Obesity    Palpitations    Pneumonia 2016   Pre-diabetes    Renal sclerosis, unspecified    only has 1 kidney   Rheumatoid arthritis (HCC)    Seizures (HCC)    last >15 +  yrs ago- recorded 07-26-2021   Somatization disorder    Tobacco abuse    Past Surgical History:  Procedure Laterality Date   ABDOMINAL HYSTERECTOMY     ANTERIOR HIP REVISION Left 07/25/2016   Procedure: LEFT HIP IRRIGATION AND DEBRIDEMENT, REVISION OF HEAD AND LINER;  Surgeon: Kristin Kos, MD;  Location: [facility];  Service: Orthopedics;  Laterality: Left;   APPLICATION OF WOUND VAC Left 12/23/2019   Procedure: APPLICATION OF WOUND VAC;  Surgeon: Kristin Kos, MD;  Location: [facility];  Service: Orthopedics;  Laterality: Left;   BREAST EXCISIONAL BIOPSY Right    CHOLECYSTECTOMY     COLONOSCOPY     DEBRIDEMENT AND CLOSURE WOUND Left 07/17/2019   Procedure: Excision of left leg wound with ACell placement and primary closure;  Surgeon: Kristin Form, DO;  Location: [facility];  Service: Plastics;  Laterality: Left;  60 min   EXCISIONAL TOTAL HIP ARTHROPLASTY WITH ANTIBIOTIC SPACERS Left 12/23/2019    Procedure: LEFT HIP IRRIGATION AND DEBRIDEMENT LEFT HIP WOUND and poly exchange ;  Surgeon: Kristin Kos, MD;  Location: [facility];  Service: Orthopedics;  Laterality: Left;   HIP DEBRIDEMENT Left 01/19/2018   Procedure(s) Performed: IRRIGATION AND DEBRIDEMENT LEFT HIP (Left )   INCISION AND DRAINAGE HIP Left 08/03/2016   Procedure: IRRIGATION AND DEBRIDEMENT LEFT HIP, VAC PLACEMENT;  Surgeon: Kristin Kos, MD;  Location: [facility];  Service: Orthopedics;  Laterality: Left;   INCISION AND DRAINAGE HIP Left 01/19/2018   Procedure: IRRIGATION AND DEBRIDEMENT LEFT HIP;  Surgeon: Kristin Kos, MD;  Location: [facility];  Service: Orthopedics;  Laterality: Left;   kidney stent Right 2002   TONSILLECTOMY Bilateral 12/06/2014   Procedure: TONSILLECTOMY, BILATERAL;  Surgeon: Kristin Beam, MD;  Location: [facility] OR;  Service: ENT;  Laterality: Bilateral;   TONSILLECTOMY     TOTAL HIP ARTHROPLASTY Left 06/01/2016   Procedure: LEFT TOTAL HIP ARTHROPLASTY ANTERIOR APPROACH;  Surgeon: Kristin Kos, MD;  Location: [facility];  Service: Orthopedics;  Laterality: Left;   TUBAL LIGATION     WOUND EXPLORATION  Left 01/19/2018   Procedure: WOUND EXPLORATION;  Surgeon: Kristin Kos, MD;  Location: [facility] OR;  Service: Orthopedics;  Laterality: Left;   Patient Active Problem List   Diagnosis Date Noted   Recent skin changes 10/15/2022   Urinary retention 12/13/2021   Postoperative anemia due to acute blood loss 12/10/2021   Presence of left artificial hip joint 12/10/2021   Type 2 diabetes mellitus with obesity (HCC) 12/09/2021   Infection and inflammatory reaction due to other internal joint prosthesis, initial encounter (HCC) 12/09/2021   Preop examination 12/09/2021   HTN (hypertension) 12/08/2021   Arthritis 11/26/2021   Seizures (HCC) 11/26/2021   Rash 09/18/2020   Worsening headaches 01/21/2020   Pruritus 10/30/2019   Hypercalcemia 10/24/2019   Body mass index 45.0-49.9, adult (HCC) 08/27/2019   Intertrigo 07/09/2019    Facial rash 07/27/2018   Body mass index 40.0-44.9, adult (HCC) 02/15/2018   Fatigue associated with anemia 02/07/2018   Unspecified open wound, left hip, initial encounter 01/19/2018   Wound of left leg, sequela 10/03/2016   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HCC) 08/24/2016   Status post left hip replacement 06/01/2016   Sinus tachycardia 05/19/2016   History of adenomatous polyp of colon 03/21/2016   Diabetes (HCC) 01/21/2016   Thyroid nodule 12/25/2014   Infiltrate noted on imaging study    Cervical spine arthritis 11/14/2014   Primary osteoarthritis of left hip 11/14/2014   Loss of consciousness (HCC) 11/14/2014   Chest pain 03/26/2014   Musculoskeletal chest and rib pain 03/26/2014   Rib pain on right side 02/28/2014   Groin pain 04/18/2013   UNSPECIFIED RENAL SCLEROSIS 01/16/2009   GERD, SEVERE 05/25/2007   Morbid obesity (HCC) 04/03/2007   Dyslipidemia 08/31/2006   Major depression, recurrent (HCC) 08/31/2006   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   Former smoker 08/31/2006   HYPERTENSION, BENIGN SYSTEMIC 08/31/2006   GASTRIC ULCER ACUTE WITHOUT HEMORRHAGE 08/31/2006   CONVULSIONS, SEIZURES, NOS 08/31/2006    REFERRING DIAG: Z61.096 (ICD-10-CM) - Presence of left artificial hip joint  THERAPY DIAG: Presence of left artificial hip joint   Rationale for Evaluation and Treatment Rehabilitation  PERTINENT HISTORY: Patient ID: Kristin [name] is a 67 y.o. female.  Chief Complaint: Follow-up of the Left Hip  Months out from reimplantation over left total hip for chronic periprosthetic joint infection. Otherwise she is doing well. She rates her pain as a 3/10. She has been partial weight-bearing. She has had some accumulation of her seroma since removal of her drains. She has been off all of her antibiotics. Cultures were negative at the time of surgery   PRECAUTIONS:  posterior hip precautions   WEIGHT BEARING RESTRICTIONS: Yes WBAT      SUBJECTIVE:  SUBJECTIVE STATEMENT: Doing pretty good today. 3/10 pain in both hips. I am using my smaller walker because my swelling has gone down in my hips.        PAIN:  Are you having pain? Yes: NPRS scale: 3/10 left, right hip 3/10 Pain location: L hip Pain description: ache Aggravating factors: walking Relieving factors: rest and meds   OBJECTIVE: (objective measures completed at initial evaluation unless otherwise dated)   DIAGNOSTIC FINDINGS: none noted   PATIENT SURVEYS:  FOTO 47(51 predicted) FOTO 42 (09/19/22)   COGNITION: Overall cognitive status: Within functional limits for tasks assessed                         SENSATION: Not tested   MUSCLE LENGTH: Hamstrings: Right 80 deg; Left 80 deg   POSTURE:  not assessed   PALPATION: deferred   LOWER EXTREMITY ROM:   Active ROM Right eval Left eval Left 10/10/22  Hip flexion 110 85 95  Hip extension       Hip abduction       Hip adduction       Hip internal rotation       Hip external rotation       Knee flexion [facility] WFL   Knee extension [facility]   Ankle dorsiflexion       Ankle plantarflexion       Ankle inversion       Ankle eversion        (Blank rows = not tested)   LOWER EXTREMITY MMT:   MMT Right eval Left eval 3/27 See goal section  Hip flexion   4-   Hip extension   4-   Hip abduction   4-   Hip adduction       Hip internal rotation       Hip external rotation       Knee flexion   4-   Knee extension   4-   Ankle dorsiflexion       Ankle plantarflexion   4-   Ankle inversion       Ankle eversion        (Blank rows = not tested)   LOWER EXTREMITY SPECIAL TESTS:  Hip special tests: ITB negative    FUNCTIONAL TESTS: 30s chair stand 6 reps arms crossed 09/19/22: 2 MWT 215 feet  ( 1 standing rest break)      GAIT: Distance  walked: 61ft  Assistive device utilized: Environmental consultant - 2 wheeled Level of assistance: SBA Comments: slow cadence     TODAY'S TREATMENT:   [facility] Adult PT Treatment:                                                DATE: 11/03/22 Therapeutic Exercise: Nustep L5 LE only x 2.5 min, reduced to L4 for remainder: total 8 minutes  Leg press - seat 7 50# - 2x10; 60# x 10 Knee ext machine - 2x10 @15 #, 1 x 10 20# Knee flexion machine - 3x10 @25 # Staggered standing with head turns and nods , each way Right hip flexion and abduction with RUE assist. X 10 each   Therapeutic Activity: Gait in parallel bars without UE and with RUE only to simulate SPC use Side stepping in parallel bars without UE Gait in clinic with RW- focusing on upright  posture and less press down on RW     [facility] Adult PT Treatment:                                                DATE: 10/31/22 Therapeutic Exercise: Nustep L4 8 min LE only L SLR 2# 15x Supine march 15/15 2# LLE Bridge 15x Supine hip fallouts BluTB 15x B, 15/15 unilaterally L clam in R S/L BluTB 15x2 L hip flexor stretch 30s x2 STS with OH reach 10x Sidestepping at counter, 3 trips no UEsupport   [facility] Adult PT Treatment: Therapeutic Exercise: Nustep L6 6 min - LE only LTR - 20x Legs apart alternating Alternating clams - Black TB - 2x10 Staggered bridge - 2x10 - small arc Hurdle stepping - 2x10 Leg press - seat 8 - 40# - 2x10; seat 7 - 2x10; seat 7 50# - 2x10 Knee ext machine - 3x10 @15 # Knee flexion machine - 3x10 @25 #  Neuromuscular re-ed : Marching on foam with fingertip support - 3 bouts to fatigue Staggered stance on Airex 45 second bouts Rocker board DF/PF - wooden     PATIENT EDUCATION:  Education details: Discussed eval findings, rehab rationale and POC and patient is in agreement  Person educated: Patient Education method: Explanation Education comprehension: verbalized understanding and needs further education   HOME EXERCISE  PROGRAM: Access Code: [email or web address] URL: [email or web address] Date: 08/01/2022 Prepared by: Gustavus Bryant   Exercises - Sit to Stand Without Arm Support  - 2 x daily - 5 x weekly - 1 sets - 5 reps - Supine Bridge  - 2 x daily - 5 x weekly - 1 sets - 10 reps - Standing Heel Raise with Support  - 2 x daily - 5 x weekly - 1 sets - 10 reps   ASSESSMENT:   CLINICAL IMPRESSION: Todays session focused on balance, gait and LE strengthening. Pt requested gym machines today and tolerated added resistance.  Did well with single arm assist with gait in parallel bars.   OBJECTIVE IMPAIRMENTS: Abnormal gait, decreased activity tolerance, decreased balance, decreased endurance, decreased mobility, difficulty walking, decreased ROM, decreased strength, impaired perceived functional ability, and pain.    ACTIVITY LIMITATIONS: carrying, lifting, sitting, standing, squatting, sleeping, and stairs   PERSONAL FACTORS: Fitness, Past/current experiences, Time since onset of injury/illness/exacerbation, and 1 comorbidity: DM  are also affecting patient's functional outcome.    REHAB POTENTIAL: Good   CLINICAL DECISION MAKING: Evolving/moderate complexity   EVALUATION COMPLEXITY: Low     GOALS: Goals reviewed with patient? Yes   SHORT TERM GOALS: Target date: 08/22/2022   Patient to demonstrate independence in HEP Baseline:[id number] Goal status: Met   2.  239ft ambulation with RW and SBA Baseline: 43ft with RW and SBA 09/19/22: 215 feet Mod I Goal status: MET      LONG TERM GOALS: Target date: 09/12/2022 (extended to 11/23/2022)     Increase FOTO score to 51 Baseline: 47 09/19/22: 42 Goal status: ONGOING   2.  Decrease worst pain to 3/10 Baseline: 7/10 09/19/22: 5/10; 10/31/22 3/10 Goal status: Met   3.  Increase RLE strength to 4/5 Baseline:  MMT Right eval Left eval 3/27  Hip flexion   4- 4  Hip extension   4-   Hip abduction   4-   Hip adduction       Hip internal  rotation  Hip external rotation       Knee flexion   4- 4  Knee extension   4- 4  Ankle dorsiflexion       Ankle plantarflexion   4- 4    Goal status: ONGOING   4.  Patient to ambulate 200 ft with LRAD Baseline: 59ft with RW 09/19/22: 215 ft with RW  Goal status: MET  5. Patient to ambulate 275 ft with LRAD in 2 MWT Baseline: 77ft with RW 09/19/22: 215 ft with RW  10/10/22: 176 feet with RW, seated rest required  Goal status: *NEW*       PLAN:   PT FREQUENCY: 2x/week   PT DURATION: 8 weeks (11/23/2022)   PLANNED INTERVENTIONS: Therapeutic exercises, Therapeutic activity, Neuromuscular re-education, Balance training, Gait training, Patient/Family education, Self Care, Joint mobilization, Stair training, DME instructions, Aquatic Therapy, and Re-evaluation   PLAN FOR NEXT SESSION: RE-eval: HEP review and update, aerobic tasks, LE strengthening, gait training, balance/proprioceptive work      Royden Purl PTA 11/03/22 11:12 AM Phone: [phone] Fax: [phone]

## 2022-11-04 NOTE — Therapy (Deleted)
Daily Note     Patient Name: Kristin [name] MRN: [id number] DOB:08/20/1955, 67 y.o., female Today's Date: 11/03/2022  PCP: Maxwell, Allee, MD    REFERRING PROVIDER: Springer, Bryan D, MD  END OF SESSION:   PT End of Session - 11/03/22 1013     Visit Number 18    Date for PT Re-Evaluation 11/23/22    Authorization Type [employer] MCR    PT Start Time 1015    PT Stop Time 1100    PT Time Calculation (min) 45 min                Past Medical History:  Diagnosis Date   Allergy    seasonal   Anemia    Anxiety    Arthritis    Back pain, chronic    Borderline diabetes    Bronchitis    Constipation    current issue   Cough    Depression    Epilepsy (HCC)    GERD (gastroesophageal reflux disease)    Hyperlipidemia    Hypertension    IBS (irritable bowel syndrome)    Joint pain    Obesity    Palpitations    Pneumonia 2016   Pre-diabetes    Renal sclerosis, unspecified    only has 1 kidney   Rheumatoid arthritis (HCC)    Seizures (HCC)    last >15 +  yrs ago- recorded 07-26-2021   Somatization disorder    Tobacco abuse    Past Surgical History:  Procedure Laterality Date   ABDOMINAL HYSTERECTOMY     ANTERIOR HIP REVISION Left 07/25/2016   Procedure: LEFT HIP IRRIGATION AND DEBRIDEMENT, REVISION OF HEAD AND LINER;  Surgeon: Naiping M Xu, MD;  [facility]: [facility];  Service: Orthopedics;  Laterality: Left;   APPLICATION OF WOUND VAC Left 12/23/2019   Procedure: APPLICATION OF WOUND VAC;  Surgeon: Xu, Naiping M, MD;  Location: [facility];  Service: Orthopedics;  Laterality: Left;   BREAST EXCISIONAL BIOPSY Right    CHOLECYSTECTOMY     COLONOSCOPY     DEBRIDEMENT AND CLOSURE WOUND Left 07/17/2019   Procedure: Excision of left leg wound with ACell placement and primary closure;  Surgeon: Dillingham, Claire S, DO;  Location: [facility];  Service: Plastics;  Laterality: Left;  60 min   EXCISIONAL TOTAL HIP ARTHROPLASTY WITH ANTIBIOTIC SPACERS Left 12/23/2019    Procedure: LEFT HIP IRRIGATION AND DEBRIDEMENT LEFT HIP WOUND and poly exchange ;  Surgeon: Xu, Naiping M, MD;  [facility]: [facility];  Service: Orthopedics;  Laterality: Left;   HIP DEBRIDEMENT Left 01/19/2018   Procedure(s) Performed: IRRIGATION AND DEBRIDEMENT LEFT HIP (Left )   INCISION AND DRAINAGE HIP Left 08/03/2016   Procedure: IRRIGATION AND DEBRIDEMENT LEFT HIP, VAC PLACEMENT;  Surgeon: Naiping M Xu, MD;  [facility];  Service: Orthopedics;  Laterality: Left;   INCISION AND DRAINAGE HIP Left 01/19/2018   Procedure: IRRIGATION AND DEBRIDEMENT LEFT HIP;  Surgeon: Xu, Naiping M, MD;  [facility];  Service: Orthopedics;  Laterality: Left;   kidney stent Right 2002   TONSILLECTOMY Bilateral 12/06/2014   Procedure: TONSILLECTOMY, BILATERAL;  Surgeon: Mitchell Gore, MD;  [facility]: [facility];  Service: ENT;  Laterality: Bilateral;   TONSILLECTOMY     TOTAL HIP ARTHROPLASTY Left 06/01/2016   Procedure: LEFT TOTAL HIP ARTHROPLASTY ANTERIOR APPROACH;  Surgeon: Naiping M Xu, MD;  [facility];  Service: Orthopedics;  Laterality: Left;   TUBAL LIGATION     WOUND EXPLORATION   Left 01/19/2018   Procedure: WOUND EXPLORATION;  Surgeon: Xu, Naiping M, MD;  [facility];  Service: Orthopedics;  Laterality: Left;   Patient Active Problem List   Diagnosis Date Noted   Recent skin changes 10/15/2022   Urinary retention 12/13/2021   Postoperative anemia due to acute blood loss 12/10/2021   Presence of left artificial hip joint 12/10/2021   Type 2 diabetes mellitus with obesity (HCC) 12/09/2021   Infection and inflammatory reaction due to other internal joint prosthesis, initial encounter (HCC) 12/09/2021   Preop examination 12/09/2021   HTN (hypertension) 12/08/2021   Arthritis 11/26/2021   Seizures (HCC) 11/26/2021   Rash 09/18/2020   Worsening headaches 01/21/2020   Pruritus 10/30/2019   Hypercalcemia 10/24/2019   Body mass index 45.0-49.9, adult (HCC) 08/27/2019   Intertrigo 07/09/2019    Facial rash 07/27/2018   Body mass index 40.0-44.9, adult (HCC) 02/15/2018   Fatigue associated with anemia 02/07/2018   Unspecified open wound, left hip, initial encounter 01/19/2018   Wound of left leg, sequela 10/03/2016   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HCC) 08/24/2016   Status post left hip replacement 06/01/2016   Sinus tachycardia 05/19/2016   History of adenomatous polyp of colon 03/21/2016   Diabetes (HCC) 01/21/2016   Thyroid nodule 12/25/2014   Infiltrate noted on imaging study    Cervical spine arthritis 11/14/2014   Primary osteoarthritis of left hip 11/14/2014   Loss of consciousness (HCC) 11/14/2014   Chest pain 03/26/2014   Musculoskeletal chest and rib pain 03/26/2014   Rib pain on right side 02/28/2014   Groin pain 04/18/2013   UNSPECIFIED RENAL SCLEROSIS 01/16/2009   GERD, SEVERE 05/25/2007   Morbid obesity (HCC) 04/03/2007   Dyslipidemia 08/31/2006   Major depression, recurrent (HCC) 08/31/2006   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   Former smoker 08/31/2006   HYPERTENSION, BENIGN SYSTEMIC 08/31/2006   GASTRIC ULCER ACUTE WITHOUT HEMORRHAGE 08/31/2006   CONVULSIONS, SEIZURES, NOS 08/31/2006    REFERRING DIAG: Z96.642 (ICD-10-CM) - Presence of left artificial hip joint  THERAPY DIAG: Presence of left artificial hip joint   Rationale for Evaluation and Treatment Rehabilitation  PERTINENT HISTORY: Patient ID: Kristin [name] is a 67 y.o. female.  Chief Complaint: Follow-up of the Left Hip  Months out from reimplantation over left total hip for chronic periprosthetic joint infection. Otherwise she is doing well. She rates her pain as a 3/10. She has been partial weight-bearing. She has had some accumulation of her seroma since removal of her drains. She has been off all of her antibiotics. Cultures were negative at the time of surgery   PRECAUTIONS:  posterior hip precautions   WEIGHT BEARING RESTRICTIONS: Yes WBAT      SUBJECTIVE:                                                                                                                                                                                         SUBJECTIVE STATEMENT: Doing pretty good today. 3/10 pain in both hips. I am using my smaller walker because my swelling has gone down in my hips.        PAIN:  Are you having pain? Yes: NPRS scale: 3/10 left, right hip 3/10 Pain location: L hip Pain description: ache Aggravating factors: walking Relieving factors: rest and meds   OBJECTIVE: (objective measures completed at initial evaluation unless otherwise dated)   DIAGNOSTIC FINDINGS: none noted   PATIENT SURVEYS:  FOTO 47(51 predicted) FOTO 42 (09/19/22)   COGNITION: Overall cognitive status: Within functional limits for tasks assessed                         SENSATION: Not tested   MUSCLE LENGTH: Hamstrings: Right 80 deg; Left 80 deg   POSTURE:  not assessed   PALPATION: deferred   LOWER EXTREMITY ROM:   Active ROM Right eval Left eval Left 10/10/22  Hip flexion 110 85 95  Hip extension       Hip abduction       Hip adduction       Hip internal rotation       Hip external rotation       Knee flexion WFL WFL   Knee extension WFL WFL   Ankle dorsiflexion       Ankle plantarflexion       Ankle inversion       Ankle eversion        (Blank rows = not tested)   LOWER EXTREMITY MMT:   MMT Right eval Left eval 3/27 See goal section  Hip flexion   4-   Hip extension   4-   Hip abduction   4-   Hip adduction       Hip internal rotation       Hip external rotation       Knee flexion   4-   Knee extension   4-   Ankle dorsiflexion       Ankle plantarflexion   4-   Ankle inversion       Ankle eversion        (Blank rows = not tested)   LOWER EXTREMITY SPECIAL TESTS:  Hip special tests: ITB negative    FUNCTIONAL TESTS: 30s chair stand 6 reps arms crossed 09/19/22: 2 MWT 215 feet  ( 1 standing rest break)      GAIT: Distance  walked: 44ft  Assistive device utilized: Walker - 2 wheeled Level of assistance: SBA Comments: slow cadence     TODAY'S TREATMENT:   [facility] Adult PT Treatment:                                                DATE: 11/03/22 Therapeutic Exercise: Nustep L5 LE only x 2.5 min, reduced to L4 for remainder: total 8 minutes  Leg press - seat 7 50# - 2x10; 60# x 10 Knee ext machine - 2x10 @15#, 1 x 10 20# Knee flexion machine - 3x10 @25# Staggered standing with head turns and nods , each way Right hip flexion and abduction with RUE assist. X 10 each   Therapeutic Activity: Gait in parallel bars without UE and with RUE only to simulate SPC use Side stepping in parallel bars without UE Gait in clinic with RW- focusing on upright   posture and less press down on RW     [facility] Adult PT Treatment:                                                DATE: 10/31/22 Therapeutic Exercise: Nustep L4 8 min LE only L SLR 2# 15x Supine march 15/15 2# LLE Bridge 15x Supine hip fallouts BluTB 15x B, 15/15 unilaterally L clam in R S/L BluTB 15x2 L hip flexor stretch 30s x2 STS with OH reach 10x Sidestepping at counter, 3 trips no UEsupport   [facility] Adult PT Treatment: Therapeutic Exercise: Nustep L6 6 min - LE only LTR - 20x Legs apart alternating Alternating clams - Black TB - 2x10 Staggered bridge - 2x10 - small arc Hurdle stepping - 2x10 Leg press - seat 8 - 40# - 2x10; seat 7 - 2x10; seat 7 50# - 2x10 Knee ext machine - 3x10 @15# Knee flexion machine - 3x10 @25#  Neuromuscular re-ed : Marching on foam with fingertip support - 3 bouts to fatigue Staggered stance on Airex 45 second bouts Rocker board DF/PF - wooden     PATIENT EDUCATION:  Education details: Discussed eval findings, rehab rationale and POC and patient is in agreement  Person educated: Patient Education method: Explanation Education comprehension: verbalized understanding and needs further education   HOME EXERCISE  PROGRAM: Access Code: [email or web address] URL: [email or web address] Date: 08/01/2022 Prepared by: Kaysa Roulhac   Exercises - Sit to Stand Without Arm Support  - 2 x daily - 5 x weekly - 1 sets - 5 reps - Supine Bridge  - 2 x daily - 5 x weekly - 1 sets - 10 reps - Standing Heel Raise with Support  - 2 x daily - 5 x weekly - 1 sets - 10 reps   ASSESSMENT:   CLINICAL IMPRESSION: Todays session focused on balance, gait and LE strengthening. Pt requested gym machines today and tolerated added resistance.  Did well with single arm assist with gait in parallel bars.   OBJECTIVE IMPAIRMENTS: Abnormal gait, decreased activity tolerance, decreased balance, decreased endurance, decreased mobility, difficulty walking, decreased ROM, decreased strength, impaired perceived functional ability, and pain.    ACTIVITY LIMITATIONS: carrying, lifting, sitting, standing, squatting, sleeping, and stairs   PERSONAL FACTORS: Fitness, Past/current experiences, Time since onset of injury/illness/exacerbation, and 1 comorbidity: DM  are also affecting patient's functional outcome.    REHAB POTENTIAL: Good   CLINICAL DECISION MAKING: Evolving/moderate complexity   EVALUATION COMPLEXITY: Low     GOALS: Goals reviewed with patient? Yes   SHORT TERM GOALS: Target date: 08/22/2022   Patient to demonstrate independence in HEP Baseline:[email or web address] Goal status: Met   2.  200ft ambulation with RW and SBA Baseline: 44ft with RW and SBA 09/19/22: 215 feet Mod I Goal status: MET      LONG TERM GOALS: Target date: 09/12/2022 (extended to 11/23/2022)     Increase FOTO score to 51 Baseline: 47 09/19/22: 42 Goal status: ONGOING   2.  Decrease worst pain to 3/10 Baseline: 7/10 09/19/22: 5/10; 10/31/22 3/10 Goal status: Met   3.  Increase RLE strength to 4/5 Baseline:  MMT Right eval Left eval 3/27  Hip flexion   4- 4  Hip extension   4-   Hip abduction   4-   Hip adduction       Hip internal  rotation         Hip external rotation       Knee flexion   4- 4  Knee extension   4- 4  Ankle dorsiflexion       Ankle plantarflexion   4- 4    Goal status: ONGOING   4.  Patient to ambulate 200 ft with LRAD Baseline: 44ft with RW 09/19/22: 215 ft with RW  Goal status: MET  5. Patient to ambulate 275 ft with LRAD in 2 MWT Baseline: 44ft with RW 09/19/22: 215 ft with RW  10/10/22: 176 feet with RW, seated rest required  Goal status: *NEW*       PLAN:   PT FREQUENCY: 2x/week   PT DURATION: 8 weeks (11/23/2022)   PLANNED INTERVENTIONS: Therapeutic exercises, Therapeutic activity, Neuromuscular re-education, Balance training, Gait training, Patient/Family education, Self Care, Joint mobilization, Stair training, DME instructions, Aquatic Therapy, and Re-evaluation   PLAN FOR NEXT SESSION: RE-eval: HEP review and update, aerobic tasks, LE strengthening, gait training, balance/proprioceptive work      Jessica M Donoho PTA 11/03/22 11:12 AM Phone: [phone] Fax: [phone]      

## 2022-11-07 ENCOUNTER — Ambulatory Visit: Payer: 59

## 2022-11-08 ENCOUNTER — Ambulatory Visit: Payer: 59

## 2022-11-08 ENCOUNTER — Ambulatory Visit (INDEPENDENT_AMBULATORY_CARE_PROVIDER_SITE_OTHER): Payer: 59 | Admitting: Orthopaedic Surgery

## 2022-11-08 ENCOUNTER — Other Ambulatory Visit: Payer: Self-pay | Admitting: Student

## 2022-11-08 ENCOUNTER — Ambulatory Visit (INDEPENDENT_AMBULATORY_CARE_PROVIDER_SITE_OTHER): Payer: 59 | Admitting: Sports Medicine

## 2022-11-08 ENCOUNTER — Other Ambulatory Visit: Payer: Self-pay

## 2022-11-08 DIAGNOSIS — M1611 Unilateral primary osteoarthritis, right hip: Secondary | ICD-10-CM | POA: Diagnosis not present

## 2022-11-08 MED ORDER — LIDOCAINE HCL 1 % IJ SOLN
4.0000 mL | INTRAMUSCULAR | Status: AC | PRN
Start: 2022-11-08 — End: 2022-11-08
  Administered 2022-11-08: 4 mL

## 2022-11-08 MED ORDER — METHYLPREDNISOLONE ACETATE 40 MG/ML IJ SUSP
40.0000 mg | INTRAMUSCULAR | Status: AC | PRN
Start: 2022-11-08 — End: 2022-11-08
  Administered 2022-11-08: 40 mg via INTRA_ARTICULAR

## 2022-11-08 NOTE — Progress Notes (Signed)
Office Visit Note   Patient: Kristin [name]           Date of Birth: [birth year]           MRN: [id number] Visit Date: 11/08/2022              Requested by: Alfredo Martinez, MD [address] [place],  [place] [address] PCP: Alfredo Martinez, MD   Assessment & Plan: Visit Diagnoses:  1. Primary osteoarthritis of right hip     Plan: Impression is 67 year old female with advanced right hip DJD.  We will send her to Dr. Shon Baton for intra-articular steroid injection.  Follow-up as needed  Follow-Up Instructions: No follow-ups on file.   Orders:  No orders of the defined types were placed in this encounter.  No orders of the defined types were placed in this encounter.     Procedures: No procedures performed   Clinical Data: No additional findings.   Subjective: Chief Complaint  Patient presents with   Right Hip - Pain    HPI [name] comes in today for right hip DJD.  Requesting cortisone injection.  She is on chronic antibiotics for the left hip. Review of Systems   Objective: Vital Signs: There were no vitals taken for this visit.  Physical Exam  Ortho Exam Examination right hip shows pain with movement of the hip joint. Specialty Comments:  No specialty comments available.  Imaging: No results found.   PMFS History: Patient Active Problem List   Diagnosis Date Noted   Recent skin changes 10/15/2022   Urinary retention 12/13/2021   Postoperative anemia due to acute blood loss 12/10/2021   Presence of left artificial hip joint 12/10/2021   Type 2 diabetes mellitus with obesity (HCC) 12/09/2021   Infection and inflammatory reaction due to other internal joint prosthesis, initial encounter (HCC) 12/09/2021   Preop examination 12/09/2021   HTN (hypertension) 12/08/2021   Arthritis 11/26/2021   Seizures (HCC) 11/26/2021   Rash 09/18/2020   Worsening headaches 01/21/2020   Pruritus 10/30/2019   Hypercalcemia 10/24/2019   Body mass index 45.0-49.9, adult  (HCC) 08/27/2019   Intertrigo 07/09/2019   Facial rash 07/27/2018   Body mass index 40.0-44.9, adult (HCC) 02/15/2018   Fatigue associated with anemia 02/07/2018   Unspecified open wound, left hip, initial encounter 01/19/2018   Wound of left leg, sequela 10/03/2016   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HCC) 08/24/2016   Status post left hip replacement 06/01/2016   Sinus tachycardia 05/19/2016   History of adenomatous polyp of colon 03/21/2016   Diabetes (HCC) 01/21/2016   Thyroid nodule 12/25/2014   Infiltrate noted on imaging study    Cervical spine arthritis 11/14/2014   Primary osteoarthritis of left hip 11/14/2014   Loss of consciousness (HCC) 11/14/2014   Chest pain 03/26/2014   Musculoskeletal chest and rib pain 03/26/2014   Rib pain on right side 02/28/2014   Groin pain 04/18/2013   UNSPECIFIED RENAL SCLEROSIS 01/16/2009   GERD, SEVERE 05/25/2007   Morbid obesity (HCC) 04/03/2007   Dyslipidemia 08/31/2006   Major depression, recurrent (HCC) 08/31/2006   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   Former smoker 08/31/2006   HYPERTENSION, BENIGN SYSTEMIC 08/31/2006   GASTRIC ULCER ACUTE WITHOUT HEMORRHAGE 08/31/2006   CONVULSIONS, SEIZURES, NOS 08/31/2006   Past Medical History:  Diagnosis Date   Allergy    seasonal   Anemia    Anxiety    Arthritis    Back pain, chronic    Borderline diabetes  Bronchitis    Constipation    current issue   Cough    Depression    Epilepsy (HCC)    GERD (gastroesophageal reflux disease)    Hyperlipidemia    Hypertension    IBS (irritable bowel syndrome)    Joint pain    Obesity    Palpitations    Pneumonia 2016   Pre-diabetes    Renal sclerosis, unspecified    only has 1 kidney   Rheumatoid arthritis (HCC)    Seizures (HCC)    last >15 +  yrs ago- recorded 07-26-2021   Somatization disorder    Tobacco abuse     Family History  Problem Relation Age of Onset   Other Mother        failed surgery   Heart Problems Father         poss MI, not sure:per pt   Colon cancer Father    Colon cancer Maternal Uncle        not sure age of onset   Alzheimer's disease Maternal Grandmother    Heart disease Maternal Grandfather    Colon cancer Other        grandfather   Colon polyps Neg Hx    Esophageal cancer Neg Hx    Stomach cancer Neg Hx    Rectal cancer Neg Hx     Past Surgical History:  Procedure Laterality Date   ABDOMINAL HYSTERECTOMY     ANTERIOR HIP REVISION Left 07/25/2016   Procedure: LEFT HIP IRRIGATION AND DEBRIDEMENT, REVISION OF HEAD AND LINER;  Surgeon: Tarry Kos, MD;  Location: [facility];  Service: Orthopedics;  Laterality: Left;   APPLICATION OF WOUND VAC Left 12/23/2019   Procedure: APPLICATION OF WOUND VAC;  Surgeon: Tarry Kos, MD;  [facility]: [facility];  Service: Orthopedics;  Laterality: Left;   BREAST EXCISIONAL BIOPSY Right    CHOLECYSTECTOMY     COLONOSCOPY     DEBRIDEMENT AND CLOSURE WOUND Left 07/17/2019   Procedure: Excision of left leg wound with ACell placement and primary closure;  Surgeon: Peggye Form, DO;  Location: [facility];  Service: Plastics;  Laterality: Left;  60 min   EXCISIONAL TOTAL HIP ARTHROPLASTY WITH ANTIBIOTIC SPACERS Left 12/23/2019   Procedure: LEFT HIP IRRIGATION AND DEBRIDEMENT LEFT HIP WOUND and poly exchange ;  Surgeon: Tarry Kos, MD;  Location: [facility];  Service: Orthopedics;  Laterality: Left;   HIP DEBRIDEMENT Left 01/19/2018   Procedure(s) Performed: IRRIGATION AND DEBRIDEMENT LEFT HIP (Left )   INCISION AND DRAINAGE HIP Left 08/03/2016   Procedure: IRRIGATION AND DEBRIDEMENT LEFT HIP, VAC PLACEMENT;  Surgeon: Tarry Kos, MD;  [facility]: [facility];  Service: Orthopedics;  Laterality: Left;   INCISION AND DRAINAGE HIP Left 01/19/2018   Procedure: IRRIGATION AND DEBRIDEMENT LEFT HIP;  Surgeon: Tarry Kos, MD;  Location: [facility];  Service: Orthopedics;  Laterality: Left;   kidney stent Right 2002   TONSILLECTOMY Bilateral 12/06/2014    Procedure: TONSILLECTOMY, BILATERAL;  Surgeon: Melvenia Beam, MD;  [facility];  Service: ENT;  Laterality: Bilateral;   TONSILLECTOMY     TOTAL HIP ARTHROPLASTY Left 06/01/2016   Procedure: LEFT TOTAL HIP ARTHROPLASTY ANTERIOR APPROACH;  Surgeon: Tarry Kos, MD;  [facility]: [facility];  Service: Orthopedics;  Laterality: Left;   TUBAL LIGATION     WOUND EXPLORATION Left 01/19/2018   Procedure: WOUND EXPLORATION;  Surgeon: Tarry Kos, MD;  Location: [facility];  Service: Orthopedics;  Laterality: Left;  Social History   Occupational History   Occupation: Not Working  Tobacco Use   Smoking status: Former    Packs/day: 0.25    Years: 0.50    Additional pack years: 0.00    Total pack years: 0.13    Types: Cigarettes    Quit date: 01/27/2014    Years since quitting: 8.7   Smokeless tobacco: Never  Vaping Use   Vaping Use: Never used  Substance and Sexual Activity   Alcohol use: No   Drug use: No   Sexual activity: Not on file

## 2022-11-08 NOTE — Progress Notes (Signed)
   Procedure Note  Patient: SHERREE WICHMAN             Date of Birth: 1956/05/16           MRN: [id number]             Visit Date: 11/08/2022  Procedures: Visit Diagnoses:  1. Primary osteoarthritis of right hip    Large Joint Inj: R hip joint on 11/08/2022 1:45 PM Indications: pain Details: 22 G 3.5 in needle, ultrasound-guided anterior approach Medications: 4 mL lidocaine 1 %; 40 mg methylPREDNISolone acetate 40 MG/ML Outcome: tolerated well, no immediate complications  Procedure: US-guided intra-articular hip injection, right After discussion on risks/benefits/indications and informed verbal consent was obtained, a timeout was performed. Patient was lying supine on exam table. The hip was cleaned with betadine and alcohol swabs. Then utilizing ultrasound guidance, the patient's femoral head and neck junction was identified and subsequently injected with 4:1 lidocaine:depomedrol via an in-plane approach with ultrasound visualization of the injectate administered into the hip joint. Patient tolerated procedure well without immediate complications.  Procedure, treatment alternatives, risks and benefits explained, specific risks discussed. Consent was given by the patient. Immediately prior to procedure a time out was called to verify the correct patient, procedure, equipment, support staff and site/side marked as required. Patient was prepped and draped in the usual sterile fashion.    - I evaluated the patient about 10 minutes post-injection and she   had improvement in pain and range of motion - follow-up with Dr. Roda Shutters as indicated; I am happy to see them as needed  Madelyn Brunner, DO Primary Care Sports Medicine Physician  [facility] - Orthopedics  This note was dictated using Dragon naturally speaking software and may contain errors in syntax, spelling, or content which have not been identified prior to signing this note.

## 2022-11-09 ENCOUNTER — Other Ambulatory Visit: Payer: Self-pay | Admitting: Student

## 2022-11-09 NOTE — Telephone Encounter (Signed)
Previously documented in my note metformin 1000 BID. However, appears to be currently taking metformin 500 BID--will verify at next visit.

## 2022-11-10 ENCOUNTER — Ambulatory Visit: Payer: 59 | Admitting: Physical Therapy

## 2022-11-11 NOTE — Therapy (Unsigned)
Daily Note/Recertification     Patient Name: Kristin [name] MRN: [id number] DOB:08/06/1955, 67 y.o., female Today's Date: 11/14/2022  PCP: Alfredo Martinez, MD    REFERRING PROVIDER: Glee Arvin, MD  END OF SESSION:   PT End of Session - 11/14/22 0915     Visit Number 19    Number of Visits 20    Date for PT Re-Evaluation 11/23/22    Authorization Type [employer] MCR    PT Start Time 0915    PT Stop Time 0955    PT Time Calculation (min) 40 min    Activity Tolerance Patient tolerated treatment well;Patient limited by fatigue    Behavior During Therapy [facility] for tasks assessed/performed                 Past Medical History:  Diagnosis Date   Allergy    seasonal   Anemia    Anxiety    Arthritis    Back pain, chronic    Borderline diabetes    Bronchitis    Constipation    current issue   Cough    Depression    Epilepsy (HCC)    GERD (gastroesophageal reflux disease)    Hyperlipidemia    Hypertension    IBS (irritable bowel syndrome)    Joint pain    Obesity    Palpitations    Pneumonia 2016   Pre-diabetes    Renal sclerosis, unspecified    only has 1 kidney   Rheumatoid arthritis (HCC)    Seizures (HCC)    last >15 +  yrs ago- recorded 07-26-2021   Somatization disorder    Tobacco abuse    Past Surgical History:  Procedure Laterality Date   ABDOMINAL HYSTERECTOMY     ANTERIOR HIP REVISION Left 07/25/2016   Procedure: LEFT HIP IRRIGATION AND DEBRIDEMENT, REVISION OF HEAD AND LINER;  Surgeon: Tarry Kos, MD;  Location: [facility];  Service: Orthopedics;  Laterality: Left;   APPLICATION OF WOUND VAC Left 12/23/2019   Procedure: APPLICATION OF WOUND VAC;  Surgeon: Tarry Kos, MD;  Location: [facility];  Service: Orthopedics;  Laterality: Left;   BREAST EXCISIONAL BIOPSY Right    CHOLECYSTECTOMY     COLONOSCOPY     DEBRIDEMENT AND CLOSURE WOUND Left 07/17/2019   Procedure: Excision of left leg wound with ACell placement and primary closure;  Surgeon: Peggye Form, DO;  Location: [facility];  Service: Plastics;  Laterality: Left;  60 min   EXCISIONAL TOTAL HIP ARTHROPLASTY WITH ANTIBIOTIC SPACERS Left 12/23/2019   Procedure: LEFT HIP IRRIGATION AND DEBRIDEMENT LEFT HIP WOUND and poly exchange ;  Surgeon: Tarry Kos, MD;  Location: [facility];  Service: Orthopedics;  Laterality: Left;   HIP DEBRIDEMENT Left 01/19/2018   Procedure(s) Performed: IRRIGATION AND DEBRIDEMENT LEFT HIP (Left )   INCISION AND DRAINAGE HIP Left 08/03/2016   Procedure: IRRIGATION AND DEBRIDEMENT LEFT HIP, VAC PLACEMENT;  Surgeon: Tarry Kos, MD;  Location: [facility];  Service: Orthopedics;  Laterality: Left;   INCISION AND DRAINAGE HIP Left 01/19/2018   Procedure: IRRIGATION AND DEBRIDEMENT LEFT HIP;  Surgeon: Tarry Kos, MD;  Location: [facility];  Service: Orthopedics;  Laterality: Left;   kidney stent Right 2002   TONSILLECTOMY Bilateral 12/06/2014   Procedure: TONSILLECTOMY, BILATERAL;  Surgeon: Melvenia Beam, MD;  Location: [facility];  Service: ENT;  Laterality: Bilateral;   TONSILLECTOMY     TOTAL HIP ARTHROPLASTY Left 06/01/2016   Procedure: LEFT TOTAL HIP  ARTHROPLASTY ANTERIOR APPROACH;  Surgeon: Tarry Kos, MD;  Location: [facility];  Service: Orthopedics;  Laterality: Left;   TUBAL LIGATION     WOUND EXPLORATION Left 01/19/2018   Procedure: WOUND EXPLORATION;  Surgeon: Tarry Kos, MD;  Location: [facility];  Service: Orthopedics;  Laterality: Left;   Patient Active Problem List   Diagnosis Date Noted   Recent skin changes 10/15/2022   Urinary retention 12/13/2021   Postoperative anemia due to acute blood loss 12/10/2021   Presence of left artificial hip joint 12/10/2021   Type 2 diabetes mellitus with obesity (HCC) 12/09/2021   Infection and inflammatory reaction due to other internal joint prosthesis, initial encounter (HCC) 12/09/2021   Preop examination 12/09/2021   HTN (hypertension) 12/08/2021   Arthritis 11/26/2021   Seizures (HCC) 11/26/2021   Rash  09/18/2020   Worsening headaches 01/21/2020   Pruritus 10/30/2019   Hypercalcemia 10/24/2019   Body mass index 45.0-49.9, adult (HCC) 08/27/2019   Intertrigo 07/09/2019   Facial rash 07/27/2018   Body mass index 40.0-44.9, adult (HCC) 02/15/2018   Fatigue associated with anemia 02/07/2018   Unspecified open wound, left hip, initial encounter 01/19/2018   Wound of left leg, sequela 10/03/2016   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HCC) 08/24/2016   Status post left hip replacement 06/01/2016   Sinus tachycardia 05/19/2016   History of adenomatous polyp of colon 03/21/2016   Diabetes (HCC) 01/21/2016   Thyroid nodule 12/25/2014   Infiltrate noted on imaging study    Cervical spine arthritis 11/14/2014   Primary osteoarthritis of left hip 11/14/2014   Loss of consciousness (HCC) 11/14/2014   Chest pain 03/26/2014   Musculoskeletal chest and rib pain 03/26/2014   Rib pain on right side 02/28/2014   Groin pain 04/18/2013   UNSPECIFIED RENAL SCLEROSIS 01/16/2009   GERD, SEVERE 05/25/2007   Morbid obesity (HCC) 04/03/2007   Dyslipidemia 08/31/2006   Major depression, recurrent (HCC) 08/31/2006   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   Former smoker 08/31/2006   HYPERTENSION, BENIGN SYSTEMIC 08/31/2006   GASTRIC ULCER ACUTE WITHOUT HEMORRHAGE 08/31/2006   CONVULSIONS, SEIZURES, NOS 08/31/2006    REFERRING DIAG: Z61.096 (ICD-10-CM) - Presence of left artificial hip joint  THERAPY DIAG: Presence of left artificial hip joint   Rationale for Evaluation and Treatment Rehabilitation  PERTINENT HISTORY: Patient ID: Kristin [name] is a 67 y.o. female.  Chief Complaint: Follow-up of the Left Hip  Months out from reimplantation over left total hip for chronic periprosthetic joint infection. Otherwise she is doing well. She rates her pain as a 3/10. She has been partial weight-bearing. She has had some accumulation of her seroma since removal of her drains. She has been off all of her  antibiotics. Cultures were negative at the time of surgery   PRECAUTIONS:  posterior hip precautions   WEIGHT BEARING RESTRICTIONS: Yes WBAT      SUBJECTIVE:  SUBJECTIVE STATEMENT: 3/10 pain, doe snot feel she is stable and strong enough to DC her walker.  Pain and soreness has stabilized, but strength remains an issue.  Had a CSI into R hip last week.       PAIN:  Are you having pain? Yes: NPRS scale: 3/10 left, right hip 3/10 Pain location: L hip Pain description: ache Aggravating factors: walking Relieving factors: rest and meds   OBJECTIVE: (objective measures completed at initial evaluation unless otherwise dated)   DIAGNOSTIC FINDINGS: none noted   PATIENT SURVEYS:  FOTO 47(51 predicted) FOTO 42 (09/19/22)   COGNITION: Overall cognitive status: Within functional limits for tasks assessed                         SENSATION: Not tested   MUSCLE LENGTH: Hamstrings: Right 80 deg; Left 80 deg   POSTURE:  not assessed   PALPATION: deferred   LOWER EXTREMITY ROM:   Active ROM Right eval Left eval Left 10/10/22 L 11/14/22  Hip flexion 110 85 95 110d  Hip extension      10d(PROM)  Hip abduction        Hip adduction        Hip internal rotation        Hip external rotation        Knee flexion [facility] WFL    Knee extension [facility]    Ankle dorsiflexion        Ankle plantarflexion        Ankle inversion        Ankle eversion         (Blank rows = not tested)   LOWER EXTREMITY MMT:   MMT Right eval Left eval L  11/14/22  Hip flexion   4- 4-  Hip extension   4- 3  Hip abduction   4- 3  Hip adduction       Hip internal rotation       Hip external rotation       Knee flexion   4-   Knee extension   4-   Ankle dorsiflexion       Ankle plantarflexion   4-   Ankle inversion        Ankle eversion        (Blank rows = not tested)   LOWER EXTREMITY SPECIAL TESTS:  Hip special tests: ITB negative    FUNCTIONAL TESTS: 30s chair stand 6 reps arms crossed; 11/14/22 9 reps arms crossed 09/19/22: 2 MWT 215 feet  ( 1 standing rest break); 11/14/22 275 ft with RW and no need of rest break     GAIT: Distance walked: 21ft  Assistive device utilized: Environmental consultant - 2 wheeled Level of assistance: SBA Comments: slow cadence     TODAY'S TREATMENT:   [facility] Adult PT Treatment:                                                DATE: 11/14/22 Therapeutic Exercise: Nustep LE only L5 8 min L SLR 2# 15x Supine march 15/15 2# LLE Bridge 15x w/ball Bridge against BlaTB 15x Supine hip fallouts BlaTB 15x B, 15/15 unilaterally L hip flexor stretch 30s x2 STS arms crossed 10x from airex  [facility] Adult PT Treatment:  DATE: 11/03/22 Therapeutic Exercise: Nustep L5 LE only x 2.5 min, reduced to L4 for remainder: total 8 minutes  Leg press - seat 7 50# - 2x10; 60# x 10 Knee ext machine - 2x10 @15 #, 1 x 10 20# Knee flexion machine - 3x10 @25 # Staggered standing with head turns and nods , each way Right hip flexion and abduction with RUE assist. X 10 each   Therapeutic Activity: Gait in parallel bars without UE and with RUE only to simulate SPC use Side stepping in parallel bars without UE Gait in clinic with RW- focusing on upright posture and less press down on RW     [facility] Adult PT Treatment:                                                DATE: 10/31/22 Therapeutic Exercise: Nustep L4 8 min LE only L SLR 2# 15x Supine march 15/15 2# LLE Bridge 15x Supine hip fallouts BluTB 15x B, 15/15 unilaterally L clam in R S/L BluTB 15x2 L hip flexor stretch 30s x2 STS with OH reach 10x Sidestepping at counter, 3 trips no UEsupport   [facility] Adult PT Treatment: Therapeutic Exercise: Nustep L6 6 min - LE only LTR - 20x Legs apart alternating Alternating  clams - Black TB - 2x10 Staggered bridge - 2x10 - small arc Hurdle stepping - 2x10 Leg press - seat 8 - 40# - 2x10; seat 7 - 2x10; seat 7 50# - 2x10 Knee ext machine - 3x10 @15 # Knee flexion machine - 3x10 @25 #  Neuromuscular re-ed : Marching on foam with fingertip support - 3 bouts to fatigue Staggered stance on Airex 45 second bouts Rocker board DF/PF - wooden     PATIENT EDUCATION:  Education details: Discussed eval findings, rehab rationale and POC and patient is in agreement  Person educated: Patient Education method: Explanation Education comprehension: verbalized understanding and needs further education   HOME EXERCISE PROGRAM: Access Code: [email or web address] URL: [email or web address] Date: 08/01/2022 Prepared by: Gustavus Bryant   Exercises - Sit to Stand Without Arm Support  - 2 x daily - 5 x weekly - 1 sets - 5 reps - Supine Bridge  - 2 x daily - 5 x weekly - 1 sets - 10 reps - Standing Heel Raise with Support  - 2 x daily - 5 x weekly - 1 sets - 10 reps   ASSESSMENT:   CLINICAL IMPRESSION: Patient progressing with function as evidenced by increased ambulation distance on 2 MWT and reps on 30s chair stand test.  ROM has increased and is now functional however isolated strength testing continues to show weakness in extension and abduction. Advanced difficulty of all tasks to focus on strength and balance with functional training.  OBJECTIVE IMPAIRMENTS: Abnormal gait, decreased activity tolerance, decreased balance, decreased endurance, decreased mobility, difficulty walking, decreased ROM, decreased strength, impaired perceived functional ability, and pain.    ACTIVITY LIMITATIONS: carrying, lifting, sitting, standing, squatting, sleeping, and stairs   PERSONAL FACTORS: Fitness, Past/current experiences, Time since onset of injury/illness/exacerbation, and 1 comorbidity: DM  are also affecting patient's functional outcome.    REHAB POTENTIAL: Good   CLINICAL  DECISION MAKING: Evolving/moderate complexity   EVALUATION COMPLEXITY: Low     GOALS: Goals reviewed with patient? Yes   SHORT TERM GOALS: Target date: 08/22/2022   Patient to demonstrate independence in HEP Baseline:[id number] Goal  status: Met   2.  218ft ambulation with RW and SBA Baseline: 22ft with RW and SBA 09/19/22: 215 feet Mod I Goal status: MET      LONG TERM GOALS: Target date: 09/12/2022 (extended to 11/23/2022)     Increase FOTO score to 51 Baseline: 47 09/19/22: 42 Goal status: ONGOING   2.  Decrease worst pain to 3/10 Baseline: 7/10 09/19/22: 5/10; 10/31/22 3/10 Goal status: Met   3.  Increase RLE strength to 4/5 Baseline:  MMT Right eval Left eval L  11/14/22  Hip flexion   4- 4-  Hip extension   4- 3  Hip abduction   4- 3    Goal status: ONGOING   4.  Patient to ambulate 200 ft with LRAD Baseline: 33ft with RW 09/19/22: 215 ft with RW  Goal status: MET  5. Patient to ambulate 200 ft with cane(revised 11/14/22) Baseline: 63ft with RW 09/19/22: 215 ft with RW  10/10/22: 176 feet with RW, seated rest required  11/14/22; 237ft with RW in 2 min Goal status: *NEW*       PLAN:   PT FREQUENCY: 1x/week   PT DURATION: 4 weeks (01/14/2023)   PLANNED INTERVENTIONS: Therapeutic exercises, Therapeutic activity, Neuromuscular re-education, Balance training, Gait training, Patient/Family education, Self Care, Joint mobilization, Stair training, DME instructions, Aquatic Therapy, and Re-evaluation   PLAN FOR NEXT SESSION: HEP review and update, aerobic tasks, LE strengthening, gait training, balance/proprioceptive work      Hildred Laser PT 11/14/22 10:09 AM Phone: [phone] Fax: [phone]

## 2022-11-14 ENCOUNTER — Ambulatory Visit: Payer: 59

## 2022-11-14 DIAGNOSIS — Z96649 Presence of unspecified artificial hip joint: Secondary | ICD-10-CM | POA: Diagnosis not present

## 2022-11-14 DIAGNOSIS — M6281 Muscle weakness (generalized): Secondary | ICD-10-CM

## 2022-11-14 DIAGNOSIS — M25552 Pain in left hip: Secondary | ICD-10-CM

## 2022-11-14 NOTE — Addendum Note (Signed)
Addended by: Hildred Laser on: 11/14/2022 10:11 AM   Modules accepted: Orders

## 2022-11-15 ENCOUNTER — Ambulatory Visit: Payer: Self-pay | Admitting: Student

## 2022-11-15 NOTE — Progress Notes (Deleted)
  SUBJECTIVE:   CHIEF COMPLAINT / HPI:    Type 2 Diabetes: Home medications include: Metformin 500 BID. {Blank single:19197::"Does","Does not"} endorse compliance. Home glucose monitoring {home testing:315145}.   Most recent A1Cs:  Lab Results  Component Value Date   HGBA1C 6.0 10/14/2022   HGBA1C 6.0 07/19/2022   Last Microalbumin, LDL, Creatinine: Lab Results  Component Value Date   MICROALBUR 30 10/30/2019   LDLCALC 61 07/19/2022   CREATININE 0.96 07/19/2022    Patient {rwisisnot:24883} up to date on diabetic eye. Patient {rwisisnot:24883} up to date on diabetic foot exam.  Hypertension:   today. Home medications include: Lopressor 25 mg, HCTZ 25 mg daily. She {Blank single:19197::"endorses","does not endorse"} taking these medications as prescribed. {Blank single:19197::"Does","Does not"} check blood pressure at home.*** Diet ***. Exercise ***. Most recent creatinine trend:  Lab Results  Component Value Date   CREATININE 0.96 07/19/2022   CREATININE 1.04 (H) 10/18/2021   CREATININE 1.10 (H) 12/26/2019   Patient {HAS HAS WUJ:81191} had a BMP in the past 1 year.      Renal Sclerosis -Documented on previous notes  -Unable to see nephrology notes    PERTINENT  PMH / PSH:   renal sclerosis?, HTN, GERD, DM2   Patient Care Team: Alfredo Martinez, MD as PCP - General (Family Medicine) OBJECTIVE:  There were no vitals taken for this visit. Physical Exam   ASSESSMENT/PLAN:  There are no diagnoses linked to this encounter. No follow-ups on file. Alfredo Martinez, MD 11/15/2022, 9:31 AM PGY-***, [facility] {    This will disappear when note is signed, click to select method of visit    :1}

## 2022-11-22 ENCOUNTER — Ambulatory Visit: Payer: 59 | Admitting: Physical Therapy

## 2022-11-22 ENCOUNTER — Encounter: Payer: Self-pay | Admitting: Physical Therapy

## 2022-11-22 DIAGNOSIS — Z96649 Presence of unspecified artificial hip joint: Secondary | ICD-10-CM

## 2022-11-22 DIAGNOSIS — M25552 Pain in left hip: Secondary | ICD-10-CM

## 2022-11-22 DIAGNOSIS — M6281 Muscle weakness (generalized): Secondary | ICD-10-CM

## 2022-11-22 NOTE — Therapy (Signed)
Progress Note Reporting Period 3/18 to 5/21  See note below for Objective Data and Assessment of Progress/Goals.        Patient Name: Kristin [name] MRN: [id number] DOB:1956-02-16, 67 y.o., female Today's Date: 11/22/2022  PCP: Alfredo Martinez, MD    REFERRING PROVIDER: Glee Arvin, MD  END OF SESSION:   PT End of Session - 11/22/22 1000     Visit Number 20    Number of Visits 24    Date for PT Re-Evaluation 01/14/23    Authorization Type [employer] MCR    PT Start Time 1000    PT Stop Time 1041    PT Time Calculation (min) 41 min    Activity Tolerance Patient tolerated treatment well;Patient limited by fatigue    Behavior During Therapy WFL for tasks assessed/performed                 Past Medical History:  Diagnosis Date   Allergy    seasonal   Anemia    Anxiety    Arthritis    Back pain, chronic    Borderline diabetes    Bronchitis    Constipation    current issue   Cough    Depression    Epilepsy (HCC)    GERD (gastroesophageal reflux disease)    Hyperlipidemia    Hypertension    IBS (irritable bowel syndrome)    Joint pain    Obesity    Palpitations    Pneumonia 2016   Pre-diabetes    Renal sclerosis, unspecified    only has 1 kidney   Rheumatoid arthritis (HCC)    Seizures (HCC)    last >15 +  yrs ago- recorded 07-26-2021   Somatization disorder    Tobacco abuse    Past Surgical History:  Procedure Laterality Date   ABDOMINAL HYSTERECTOMY     ANTERIOR HIP REVISION Left 07/25/2016   Procedure: LEFT HIP IRRIGATION AND DEBRIDEMENT, REVISION OF HEAD AND LINER;  Surgeon: Tarry Kos, MD;  Location: [facility] OR;  Service: Orthopedics;  Laterality: Left;   APPLICATION OF WOUND VAC Left 12/23/2019   Procedure: APPLICATION OF WOUND VAC;  Surgeon: Tarry Kos, MD;  Location: [facility];  Service: Orthopedics;  Laterality: Left;   BREAST EXCISIONAL BIOPSY Right    CHOLECYSTECTOMY     COLONOSCOPY     DEBRIDEMENT AND CLOSURE WOUND Left 07/17/2019    Procedure: Excision of left leg wound with ACell placement and primary closure;  Surgeon: Peggye Form, DO;  Location: [facility];  Service: Plastics;  Laterality: Left;  60 min   EXCISIONAL TOTAL HIP ARTHROPLASTY WITH ANTIBIOTIC SPACERS Left 12/23/2019   Procedure: LEFT HIP IRRIGATION AND DEBRIDEMENT LEFT HIP WOUND and poly exchange ;  Surgeon: Tarry Kos, MD;  Location: [facility];  Service: Orthopedics;  Laterality: Left;   HIP DEBRIDEMENT Left 01/19/2018   Procedure(s) Performed: IRRIGATION AND DEBRIDEMENT LEFT HIP (Left )   INCISION AND DRAINAGE HIP Left 08/03/2016   Procedure: IRRIGATION AND DEBRIDEMENT LEFT HIP, VAC PLACEMENT;  Surgeon: Tarry Kos, MD;  Location: [facility];  Service: Orthopedics;  Laterality: Left;   INCISION AND DRAINAGE HIP Left 01/19/2018   Procedure: IRRIGATION AND DEBRIDEMENT LEFT HIP;  Surgeon: Tarry Kos, MD;  Location: [facility];  Service: Orthopedics;  Laterality: Left;   kidney stent Right 2002   TONSILLECTOMY Bilateral 12/06/2014   Procedure: TONSILLECTOMY, BILATERAL;  Surgeon: Melvenia Beam, MD;  Location[facility];  Service: ENT;  Laterality:  Bilateral;   TONSILLECTOMY     TOTAL HIP ARTHROPLASTY Left 06/01/2016   Procedure: LEFT TOTAL HIP ARTHROPLASTY ANTERIOR APPROACH;  Surgeon: Tarry Kos, MD;  Location: [facility];  Service: Orthopedics;  Laterality: Left;   TUBAL LIGATION     WOUND EXPLORATION Left 01/19/2018   Procedure: WOUND EXPLORATION;  Surgeon: Tarry Kos, MD;  Location: [facility];  Service: Orthopedics;  Laterality: Left;   Patient Active Problem List   Diagnosis Date Noted   Recent skin changes 10/15/2022   Urinary retention 12/13/2021   Postoperative anemia due to acute blood loss 12/10/2021   Presence of left artificial hip joint 12/10/2021   Type 2 diabetes mellitus with obesity (HCC) 12/09/2021   Infection and inflammatory reaction due to other internal joint prosthesis, initial encounter (HCC) 12/09/2021   Preop examination  12/09/2021   HTN (hypertension) 12/08/2021   Arthritis 11/26/2021   Seizures (HCC) 11/26/2021   Rash 09/18/2020   Worsening headaches 01/21/2020   Pruritus 10/30/2019   Hypercalcemia 10/24/2019   Body mass index 45.0-49.9, adult (HCC) 08/27/2019   Intertrigo 07/09/2019   Facial rash 07/27/2018   Body mass index 40.0-44.9, adult (HCC) 02/15/2018   Fatigue associated with anemia 02/07/2018   Unspecified open wound, left hip, initial encounter 01/19/2018   Wound of left leg, sequela 10/03/2016   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HCC) 08/24/2016   Status post left hip replacement 06/01/2016   Sinus tachycardia 05/19/2016   History of adenomatous polyp of colon 03/21/2016   Diabetes (HCC) 01/21/2016   Thyroid nodule 12/25/2014   Infiltrate noted on imaging study    Cervical spine arthritis 11/14/2014   Primary osteoarthritis of left hip 11/14/2014   Loss of consciousness (HCC) 11/14/2014   Chest pain 03/26/2014   Musculoskeletal chest and rib pain 03/26/2014   Rib pain on right side 02/28/2014   Groin pain 04/18/2013   UNSPECIFIED RENAL SCLEROSIS 01/16/2009   GERD, SEVERE 05/25/2007   Morbid obesity (HCC) 04/03/2007   Dyslipidemia 08/31/2006   Major depression, recurrent (HCC) 08/31/2006   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   Former smoker 08/31/2006   HYPERTENSION, BENIGN SYSTEMIC 08/31/2006   GASTRIC ULCER ACUTE WITHOUT HEMORRHAGE 08/31/2006   CONVULSIONS, SEIZURES, NOS 08/31/2006    REFERRING DIAG: Z61.096 (ICD-10-CM) - Presence of left artificial hip joint  THERAPY DIAG: Presence of left artificial hip joint   Rationale for Evaluation and Treatment Rehabilitation  PERTINENT HISTORY: Patient ID: Kristin [name] is a 67 y.o. female.  Chief Complaint: Follow-up of the Left Hip  Months out from reimplantation over left total hip for chronic periprosthetic joint infection. Otherwise she is doing well. She rates her pain as a 3/10. She has been partial  weight-bearing. She has had some accumulation of her seroma since removal of her drains. She has been off all of her antibiotics. Cultures were negative at the time of surgery   PRECAUTIONS:  posterior hip precautions   WEIGHT BEARING RESTRICTIONS: Yes WBAT      SUBJECTIVE:  SUBJECTIVE STATEMENT: Pt reports that she is doing well overall.  She feels like her ambulation is improved and has had several people comment on her progress.       PAIN:  Are you having pain? Yes: NPRS scale: 3/10 left, right hip 3/10 Pain location: L hip Pain description: ache Aggravating factors: walking Relieving factors: rest and meds   OBJECTIVE: (objective measures completed at initial evaluation unless otherwise dated)   DIAGNOSTIC FINDINGS: none noted   PATIENT SURVEYS:  FOTO 47(51 predicted) FOTO 42 (09/19/22)   COGNITION: Overall cognitive status: Within functional limits for tasks assessed                         SENSATION: Not tested   MUSCLE LENGTH: Hamstrings: Right 80 deg; Left 80 deg   POSTURE:  not assessed   PALPATION: deferred   LOWER EXTREMITY ROM:   Active ROM Right eval Left eval Left 10/10/22 L 11/14/22  Hip flexion 110 85 95 110d  Hip extension      10d(PROM)  Hip abduction        Hip adduction        Hip internal rotation        Hip external rotation        Knee flexion [facility] WFL    Knee extension [facility]    Ankle dorsiflexion        Ankle plantarflexion        Ankle inversion        Ankle eversion         (Blank rows = not tested)   LOWER EXTREMITY MMT:   MMT Right eval Left eval L  11/14/22  Hip flexion   4- 4-  Hip extension   4- 3  Hip abduction   4- 3  Hip adduction       Hip internal rotation       Hip external rotation       Knee flexion   4-   Knee extension   4-    Ankle dorsiflexion       Ankle plantarflexion   4-   Ankle inversion       Ankle eversion        (Blank rows = not tested)   LOWER EXTREMITY SPECIAL TESTS:  Hip special tests: ITB negative    FUNCTIONAL TESTS: 30s chair stand 6 reps arms crossed; 11/14/22 9 reps arms crossed 09/19/22: 2 MWT 215 feet  ( 1 standing rest break); 11/14/22 275 ft with RW and no need of rest break     GAIT: Distance walked: 40ft  Assistive device utilized: Environmental consultant - 2 wheeled Level of assistance: SBA Comments: slow cadence     TODAY'S TREATMENT:    [facility] Adult PT Treatment:                                                DATE: 11/22/22 Therapeutic Exercise: Therapeutic Exercise: Nustep L5 LE only x 5 min Modified thomas stretch - 2' ea  Leg press - seat 7 50# - 2x10; 60# 2x10 Step ups - 2'' step - 2x10 ea Knee ext machine - x10 @15 #, 3 x 10 20# Knee flexion machine - 3x10 @25 # Standing hip abd machine - 12.5# - x10 ea  [facility] Adult PT Treatment:  DATE: 11/14/22 Therapeutic Exercise: Nustep LE only L5 8 min L SLR 2# 15x Supine march 15/15 2# LLE Bridge 15x w/ball Bridge against BlaTB 15x Supine hip fallouts BlaTB 15x B, 15/15 unilaterally L hip flexor stretch 30s x2 STS arms crossed 10x from airex  [facility] Adult PT Treatment:                                                DATE: 11/03/22 Therapeutic Exercise: Nustep L5 LE only x 2.5 min, reduced to L4 for remainder: total 8 minutes  Leg press - seat 7 50# - 2x10; 60# x 10 Knee ext machine - 2x10 @15 #, 1 x 10 20# Knee flexion machine - 3x10 @25 # Staggered standing with head turns and nods , each way Right hip flexion and abduction with RUE assist. X 10 each   Therapeutic Activity: Gait in parallel bars without UE and with RUE only to simulate SPC use Side stepping in parallel bars without UE Gait in clinic with RW- focusing on upright posture and less press down on RW     [facility] Adult PT Treatment:                                                 DATE: 10/31/22 Therapeutic Exercise: Nustep L4 8 min LE only L SLR 2# 15x Supine march 15/15 2# LLE Bridge 15x Supine hip fallouts BluTB 15x B, 15/15 unilaterally L clam in R S/L BluTB 15x2 L hip flexor stretch 30s x2 STS with OH reach 10x Sidestepping at counter, 3 trips no UEsupport   [facility] Adult PT Treatment: Therapeutic Exercise: Nustep L6 6 min - LE only LTR - 20x Legs apart alternating Alternating clams - Black TB - 2x10 Staggered bridge - 2x10 - small arc Hurdle stepping - 2x10 Leg press - seat 8 - 40# - 2x10; seat 7 - 2x10; seat 7 50# - 2x10 Knee ext machine - 3x10 @15 # Knee flexion machine - 3x10 @25 #  Neuromuscular re-ed : Marching on foam with fingertip support - 3 bouts to fatigue Staggered stance on Airex 45 second bouts Rocker board DF/PF - wooden     PATIENT EDUCATION:  Education details: Discussed eval findings, rehab rationale and POC and patient is in agreement  Person educated: Patient Education method: Explanation Education comprehension: verbalized understanding and needs further education   HOME EXERCISE PROGRAM: Access Code: [email or web address] URL: [email or web address] Date: 08/01/2022 Prepared by: Gustavus Bryant   Exercises - Sit to Stand Without Arm Support  - 2 x daily - 5 x weekly - 1 sets - 5 reps - Supine Bridge  - 2 x daily - 5 x weekly - 1 sets - 10 reps - Standing Heel Raise with Support  - 2 x daily - 5 x weekly - 1 sets - 10 reps   ASSESSMENT:   CLINICAL IMPRESSION: Deani has progressed well with therapy.  Improved impairments include: LE strength, pain, gait.  Functional improvements include: ability to ambulate farther in community, transfers, standing for longer periods doing housework and other ADLs.  Progressions needed include: continued work on LE strengthening, balance, gait.  Barriers to progress include: post-op complications.  Please see GOALS section for progress on short  term and long  term goals established at evaluation.  I recommend continuation of PT to allow completion of remaining goals and continued functional progression.  OBJECTIVE IMPAIRMENTS: Abnormal gait, decreased activity tolerance, decreased balance, decreased endurance, decreased mobility, difficulty walking, decreased ROM, decreased strength, impaired perceived functional ability, and pain.    ACTIVITY LIMITATIONS: carrying, lifting, sitting, standing, squatting, sleeping, and stairs   PERSONAL FACTORS: Fitness, Past/current experiences, Time since onset of injury/illness/exacerbation, and 1 comorbidity: DM  are also affecting patient's functional outcome.    REHAB POTENTIAL: Good   CLINICAL DECISION MAKING: Evolving/moderate complexity   EVALUATION COMPLEXITY: Low     GOALS: Goals reviewed with patient? Yes   SHORT TERM GOALS: Target date: 08/22/2022   Patient to demonstrate independence in HEP Baseline:[id number] Goal status: Met   2.  27ft ambulation with RW and SBA Baseline: 16ft with RW and SBA 09/19/22: 215 feet Mod I Goal status: MET      LONG TERM GOALS: Target date: 09/12/2022 (extended to 11/23/2022 then to 01/14/2023)     Increase FOTO score to 51 Baseline: 47 09/19/22: 42 Goal status: ONGOING   2.  Decrease worst pain to 3/10 Baseline: 7/10 09/19/22: 5/10; 10/31/22 3/10 Goal status: Met   3.  Increase RLE strength to 4/5 Baseline:  MMT Right eval Left eval L  11/14/22  Hip flexion   4- 4-  Hip extension   4- 3  Hip abduction   4- 3    Goal status: ONGOING   4.  Patient to ambulate 200 ft with LRAD Baseline: 77ft with RW 09/19/22: 215 ft with RW  Goal status: MET  5. Patient to ambulate 200 ft with cane(revised 11/14/22) Baseline: 34ft with RW 09/19/22: 215 ft with RW  10/10/22: 176 feet with RW, seated rest required  11/14/22; 211ft with RW in 2 min Goal status: *NEW*       PLAN:   PT FREQUENCY: 1x/week   PT DURATION: 4 weeks (01/14/2023)    PLANNED INTERVENTIONS: Therapeutic exercises, Therapeutic activity, Neuromuscular re-education, Balance training, Gait training, Patient/Family education, Self Care, Joint mobilization, Stair training, DME instructions, Aquatic Therapy, and Re-evaluation   PLAN FOR NEXT SESSION: HEP review and update, aerobic tasks, LE strengthening, gait training, balance/proprioceptive work      Fredderick Phenix PT 11/22/22 10:42 AM Phone: [phone] Fax: [phone]

## 2022-11-24 ENCOUNTER — Ambulatory Visit: Payer: 59 | Admitting: Physical Therapy

## 2022-11-24 ENCOUNTER — Encounter: Payer: Self-pay | Admitting: Physical Therapy

## 2022-11-24 DIAGNOSIS — Z96649 Presence of unspecified artificial hip joint: Secondary | ICD-10-CM | POA: Diagnosis not present

## 2022-11-24 DIAGNOSIS — M6281 Muscle weakness (generalized): Secondary | ICD-10-CM

## 2022-11-24 DIAGNOSIS — M25552 Pain in left hip: Secondary | ICD-10-CM

## 2022-11-24 NOTE — Therapy (Signed)
Treatment   Patient Name: Kristin [name] MRN: [id number] DOB:[birth year], 67 y.o., female Today's Date: 11/24/2022  PCP: Alfredo Martinez, MD    REFERRING PROVIDER: Glee Arvin, MD  END OF SESSION:   PT End of Session - 11/24/22 1002     Visit Number 21    Number of Visits 24    Date for PT Re-Evaluation 01/14/23    Authorization Type [employer] MCR    PT Start Time 1000    PT Stop Time 1041    PT Time Calculation (min) 41 min    Activity Tolerance Patient tolerated treatment well;Patient limited by fatigue    Behavior During Therapy WFL for tasks assessed/performed                 Past Medical History:  Diagnosis Date   Allergy    seasonal   Anemia    Anxiety    Arthritis    Back pain, chronic    Borderline diabetes    Bronchitis    Constipation    current issue   Cough    Depression    Epilepsy (HCC)    GERD (gastroesophageal reflux disease)    Hyperlipidemia    Hypertension    IBS (irritable bowel syndrome)    Joint pain    Obesity    Palpitations    Pneumonia 2016   Pre-diabetes    Renal sclerosis, unspecified    only has 1 kidney   Rheumatoid arthritis (HCC)    Seizures (HCC)    last >15 +  yrs ago- recorded 07-26-2021   Somatization disorder    Tobacco abuse    Past Surgical History:  Procedure Laterality Date   ABDOMINAL HYSTERECTOMY     ANTERIOR HIP REVISION Left 07/25/2016   Procedure: LEFT HIP IRRIGATION AND DEBRIDEMENT, REVISION OF HEAD AND LINER;  Surgeon: Tarry Kos, MD;  Location: [facility] OR;  Service: Orthopedics;  Laterality: Left;   APPLICATION OF WOUND VAC Left 12/23/2019   Procedure: APPLICATION OF WOUND VAC;  Surgeon: Tarry Kos, MD;  Location: [facility] OR;  Service: Orthopedics;  Laterality: Left;   BREAST EXCISIONAL BIOPSY Right    CHOLECYSTECTOMY     COLONOSCOPY     DEBRIDEMENT AND CLOSURE WOUND Left 07/17/2019   Procedure: Excision of left leg wound with ACell placement and primary closure;  Surgeon: Peggye Form, DO;   Location: [facility];  Service: Plastics;  Laterality: Left;  60 min   EXCISIONAL TOTAL HIP ARTHROPLASTY WITH ANTIBIOTIC SPACERS Left 12/23/2019   Procedure: LEFT HIP IRRIGATION AND DEBRIDEMENT LEFT HIP WOUND and poly exchange ;  Surgeon: Tarry Kos, MD;  Location: [facility] OR;  Service: Orthopedics;  Laterality: Left;   HIP DEBRIDEMENT Left 01/19/2018   Procedure(s) Performed: IRRIGATION AND DEBRIDEMENT LEFT HIP (Left )   INCISION AND DRAINAGE HIP Left 08/03/2016   Procedure: IRRIGATION AND DEBRIDEMENT LEFT HIP, VAC PLACEMENT;  Surgeon: Tarry Kos, MD;  Location: [facility] OR;  Service: Orthopedics;  Laterality: Left;   INCISION AND DRAINAGE HIP Left 01/19/2018   Procedure: IRRIGATION AND DEBRIDEMENT LEFT HIP;  Surgeon: Tarry Kos, MD;  Location: [facility] OR;  Service: Orthopedics;  Laterality: Left;   kidney stent Right 2002   TONSILLECTOMY Bilateral 12/06/2014   Procedure: TONSILLECTOMY, BILATERAL;  Surgeon: Melvenia Beam, MD;  Location: [facility] OR;  Service: ENT;  Laterality: Bilateral;   TONSILLECTOMY     TOTAL HIP ARTHROPLASTY Left 06/01/2016   Procedure: LEFT TOTAL HIP ARTHROPLASTY ANTERIOR APPROACH;  Surgeon: Tarry Kos, MD;  Location: [facility] OR;  Service: Orthopedics;  Laterality: Left;   TUBAL LIGATION     WOUND EXPLORATION Left 01/19/2018   Procedure: WOUND EXPLORATION;  Surgeon: Tarry Kos, MD;  Location: [facility] OR;  Service: Orthopedics;  Laterality: Left;   Patient Active Problem List   Diagnosis Date Noted   Recent skin changes 10/15/2022   Urinary retention 12/13/2021   Postoperative anemia due to acute blood loss 12/10/2021   Presence of left artificial hip joint 12/10/2021   Type 2 diabetes mellitus with obesity (HCC) 12/09/2021   Infection and inflammatory reaction due to other internal joint prosthesis, initial encounter (HCC) 12/09/2021   Preop examination 12/09/2021   HTN (hypertension) 12/08/2021   Arthritis 11/26/2021   Seizures (HCC) 11/26/2021   Rash 09/18/2020    Worsening headaches 01/21/2020   Pruritus 10/30/2019   Hypercalcemia 10/24/2019   Body mass index 45.0-49.9, adult (HCC) 08/27/2019   Intertrigo 07/09/2019   Facial rash 07/27/2018   Body mass index 40.0-44.9, adult (HCC) 02/15/2018   Fatigue associated with anemia 02/07/2018   Unspecified open wound, left hip, initial encounter 01/19/2018   Wound of left leg, sequela 10/03/2016   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HCC) 08/24/2016   Status post left hip replacement 06/01/2016   Sinus tachycardia 05/19/2016   History of adenomatous polyp of colon 03/21/2016   Diabetes (HCC) 01/21/2016   Thyroid nodule 12/25/2014   Infiltrate noted on imaging study    Cervical spine arthritis 11/14/2014   Primary osteoarthritis of left hip 11/14/2014   Loss of consciousness (HCC) 11/14/2014   Chest pain 03/26/2014   Musculoskeletal chest and rib pain 03/26/2014   Rib pain on right side 02/28/2014   Groin pain 04/18/2013   UNSPECIFIED RENAL SCLEROSIS 01/16/2009   GERD, SEVERE 05/25/2007   Morbid obesity (HCC) 04/03/2007   Dyslipidemia 08/31/2006   Major depression, recurrent (HCC) 08/31/2006   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   Former smoker 08/31/2006   HYPERTENSION, BENIGN SYSTEMIC 08/31/2006   GASTRIC ULCER ACUTE WITHOUT HEMORRHAGE 08/31/2006   CONVULSIONS, SEIZURES, NOS 08/31/2006    REFERRING DIAG: G95.621 (ICD-10-CM) - Presence of left artificial hip joint  THERAPY DIAG: Presence of left artificial hip joint   Rationale for Evaluation and Treatment Rehabilitation  PERTINENT HISTORY: Patient ID: Kristin [name] is a 67 y.o. female.  Chief Complaint: Follow-up of the Left Hip  Months out from reimplantation over left total hip for chronic periprosthetic joint infection. Otherwise she is doing well. She rates her pain as a 3/10. She has been partial weight-bearing. She has had some accumulation of her seroma since removal of her drains. She has been off all of her antibiotics.  Cultures were negative at the time of surgery   PRECAUTIONS:  posterior hip precautions   WEIGHT BEARING RESTRICTIONS: Yes WBAT      SUBJECTIVE:  SUBJECTIVE STATEMENT: Pt reports that she has had a little more hip tightness lately with some swelling.  She feels her strength is improving.       PAIN:  Are you having pain? Yes: NPRS scale: 3/10 left, right hip 3/10 Pain location: L hip Pain description: ache Aggravating factors: walking Relieving factors: rest and meds   OBJECTIVE: (objective measures completed at initial evaluation unless otherwise dated)   DIAGNOSTIC FINDINGS: none noted   PATIENT SURVEYS:  FOTO 47(51 predicted) FOTO 42 (09/19/22)   COGNITION: Overall cognitive status: Within functional limits for tasks assessed                         SENSATION: Not tested   MUSCLE LENGTH: Hamstrings: Right 80 deg; Left 80 deg   POSTURE:  not assessed   PALPATION: deferred   LOWER EXTREMITY ROM:   Active ROM Right eval Left eval Left 10/10/22 L 11/14/22  Hip flexion 110 85 95 110d  Hip extension      10d(PROM)  Hip abduction        Hip adduction        Hip internal rotation        Hip external rotation        Knee flexion [facility] WFL    Knee extension [facility]    Ankle dorsiflexion        Ankle plantarflexion        Ankle inversion        Ankle eversion         (Blank rows = not tested)   LOWER EXTREMITY MMT:   MMT Right eval Left eval L  11/14/22  Hip flexion   4- 4-  Hip extension   4- 3  Hip abduction   4- 3  Hip adduction       Hip internal rotation       Hip external rotation       Knee flexion   4-   Knee extension   4-   Ankle dorsiflexion       Ankle plantarflexion   4-   Ankle inversion       Ankle eversion        (Blank rows = not tested)   LOWER  EXTREMITY SPECIAL TESTS:  Hip special tests: ITB negative    FUNCTIONAL TESTS: 30s chair stand 6 reps arms crossed; 11/14/22 9 reps arms crossed 09/19/22: 2 MWT 215 feet  ( 1 standing rest break); 11/14/22 275 ft with RW and no need of rest break     GAIT: Distance walked: 49ft  Assistive device utilized: Environmental consultant - 2 wheeled Level of assistance: SBA Comments: slow cadence     TODAY'S TREATMENT:    [facility] Adult PT Treatment:                                                DATE: 11/24/22 Therapeutic Exercise: Therapeutic Exercise: Nustep L5 LE only x 5 min Forward and lateral hurdle stepping in // - fwd and lat 4 laps ea Standing hip 4 way in // 10x ea (R hip pain) Step up - 4'' step in // - 2x10 ea - fwd and lat Modified thomas stretch - 2' ea  Walking with [facility] - CGA - 185'  Leg press - seat 7 50# - 2x10;  60# 2x10 (NT) Knee ext machine - x10 @15 #, 3 x 10 20# (NT) Knee flexion machine - 3x10 @25 # (NT) Standing hip abd machine - 12.5# - x10 ea (NT)  [facility] Adult PT Treatment:                                                DATE: 11/22/22 Therapeutic Exercise: Therapeutic Exercise: Nustep L5 LE only x 5 min Modified thomas stretch - 2' ea  Leg press - seat 7 50# - 2x10; 60# 2x10 Step ups - 2'' step - 2x10 ea Knee ext machine - x10 @15 #, 3 x 10 20# Knee flexion machine - 3x10 @25 # Standing hip abd machine - 12.5# - x10 ea  [facility] Adult PT Treatment:                                                DATE: 11/14/22 Therapeutic Exercise: Nustep LE only L5 8 min L SLR 2# 15x Supine march 15/15 2# LLE Bridge 15x w/ball Bridge against BlaTB 15x Supine hip fallouts BlaTB 15x B, 15/15 unilaterally L hip flexor stretch 30s x2 STS arms crossed 10x from airex  [facility] Adult PT Treatment:                                                DATE: 11/03/22 Therapeutic Exercise: Nustep L5 LE only x 2.5 min, reduced to L4 for remainder: total 8 minutes  Leg press - seat 7 50# - 2x10; 60# x 10 Knee ext machine  - 2x10 @15 #, 1 x 10 20# Knee flexion machine - 3x10 @25 # Staggered standing with head turns and nods , each way Right hip flexion and abduction with RUE assist. X 10 each   Therapeutic Activity: Gait in parallel bars without UE and with RUE only to simulate SPC use Side stepping in parallel bars without UE Gait in clinic with RW- focusing on upright posture and less press down on RW     [facility] Adult PT Treatment:                                                DATE: 10/31/22 Therapeutic Exercise: Nustep L4 8 min LE only L SLR 2# 15x Supine march 15/15 2# LLE Bridge 15x Supine hip fallouts BluTB 15x B, 15/15 unilaterally L clam in R S/L BluTB 15x2 L hip flexor stretch 30s x2 STS with OH reach 10x Sidestepping at counter, 3 trips no UEsupport   [facility] Adult PT Treatment: Therapeutic Exercise: Nustep L6 6 min - LE only LTR - 20x Legs apart alternating Alternating clams - Black TB - 2x10 Staggered bridge - 2x10 - small arc Hurdle stepping - 2x10 Leg press - seat 8 - 40# - 2x10; seat 7 - 2x10; seat 7 50# - 2x10 Knee ext machine - 3x10 @15 # Knee flexion machine - 3x10 @25 #  Neuromuscular re-ed : Marching on foam with fingertip support - 3 bouts to fatigue Staggered stance  on Airex 45 second bouts Rocker board DF/PF - wooden     PATIENT EDUCATION:  Education details: Discussed eval findings, rehab rationale and POC and patient is in agreement  Person educated: Patient Education method: Explanation Education comprehension: verbalized understanding and needs further education   HOME EXERCISE PROGRAM: Access Code: [email or web address] URL: [email or web address] Date: 08/01/2022 Prepared by: Gustavus Bryant   Exercises - Sit to Stand Without Arm Support  - 2 x daily - 5 x weekly - 1 sets - 5 reps - Supine Bridge  - 2 x daily - 5 x weekly - 1 sets - 10 reps - Standing Heel Raise with Support  - 2 x daily - 5 x weekly - 1 sets - 10 reps   ASSESSMENT:   CLINICAL IMPRESSION:   Marisue tolerated session well with no adverse reaction.  Worked on improving step length and height with hurdle walking in // today to good effect.  Pt shows significant improvement in step height with minimal contact with hurdles today.  Will continue progressing strength combined with improving gait.  Trialed SPC; pt cued for form.  Initially did fairly well but by ~90 ft pt relied more heavily on cane for forward flexed trunk posture.  Will continue to work on this.  OBJECTIVE IMPAIRMENTS: Abnormal gait, decreased activity tolerance, decreased balance, decreased endurance, decreased mobility, difficulty walking, decreased ROM, decreased strength, impaired perceived functional ability, and pain.    ACTIVITY LIMITATIONS: carrying, lifting, sitting, standing, squatting, sleeping, and stairs   PERSONAL FACTORS: Fitness, Past/current experiences, Time since onset of injury/illness/exacerbation, and 1 comorbidity: DM  are also affecting patient's functional outcome.    REHAB POTENTIAL: Good   CLINICAL DECISION MAKING: Evolving/moderate complexity   EVALUATION COMPLEXITY: Low     GOALS: Goals reviewed with patient? Yes   SHORT TERM GOALS: Target date: 08/22/2022   Patient to demonstrate independence in HEP Baseline:[id number] Goal status: Met   2.  287ft ambulation with RW and SBA Baseline: 40ft with RW and SBA 09/19/22: 215 feet Mod I Goal status: MET      LONG TERM GOALS: Target date: 09/12/2022 (extended to 11/23/2022 then to 01/14/2023)     Increase FOTO score to 51 Baseline: 47 09/19/22: 42 Goal status: ONGOING   2.  Decrease worst pain to 3/10 Baseline: 7/10 09/19/22: 5/10; 10/31/22 3/10 Goal status: Met   3.  Increase RLE strength to 4/5 Baseline:  MMT Right eval Left eval L  11/14/22  Hip flexion   4- 4-  Hip extension   4- 3  Hip abduction   4- 3    Goal status: ONGOING   4.  Patient to ambulate 200 ft with LRAD Baseline: 73ft with RW 09/19/22: 215 ft with RW   Goal status: MET  5. Patient to ambulate 200 ft with cane(revised 11/14/22) Baseline: 74ft with RW 09/19/22: 215 ft with RW  10/10/22: 176 feet with RW, seated rest required  11/14/22; 274ft with RW in 2 min Goal status: *NEW*       PLAN:   PT FREQUENCY: 1x/week   PT DURATION: 4 weeks (01/14/2023)   PLANNED INTERVENTIONS: Therapeutic exercises, Therapeutic activity, Neuromuscular re-education, Balance training, Gait training, Patient/Family education, Self Care, Joint mobilization, Stair training, DME instructions, Aquatic Therapy, and Re-evaluation   PLAN FOR NEXT SESSION: HEP review and update, aerobic tasks, LE strengthening, gait training, balance/proprioceptive work      Fredderick Phenix PT 11/24/22 10:46 AM Phone: [phone] Fax: [phone]

## 2022-11-29 ENCOUNTER — Ambulatory Visit: Payer: 59

## 2022-11-29 DIAGNOSIS — Z96649 Presence of unspecified artificial hip joint: Secondary | ICD-10-CM

## 2022-11-29 DIAGNOSIS — M25552 Pain in left hip: Secondary | ICD-10-CM

## 2022-11-29 DIAGNOSIS — M6281 Muscle weakness (generalized): Secondary | ICD-10-CM

## 2022-11-29 NOTE — Therapy (Signed)
Treatment   Patient Name: Kristin [name] MRN: [id number] DOB:[birth year], 67 y.o., female Today's Date: 11/29/2022  PCP: Kristin Martinez, MD    REFERRING PROVIDER: Glee Arvin, MD  END OF SESSION:   PT End of Session - 11/29/22 1444     Visit Number 22    Number of Visits 24    Date for PT Re-Evaluation 01/14/23    Authorization Type [employer] MCR    PT Start Time 1445    PT Stop Time 1528    PT Time Calculation (min) 43 min    Activity Tolerance Patient tolerated treatment well;Patient limited by fatigue    Behavior During Therapy WFL for tasks assessed/performed                  Past Medical History:  Diagnosis Date   Allergy    seasonal   Anemia    Anxiety    Arthritis    Back pain, chronic    Borderline diabetes    Bronchitis    Constipation    current issue   Cough    Depression    Epilepsy (HCC)    GERD (gastroesophageal reflux disease)    Hyperlipidemia    Hypertension    IBS (irritable bowel syndrome)    Joint pain    Obesity    Palpitations    Pneumonia 2016   Pre-diabetes    Renal sclerosis, unspecified    only has 1 kidney   Rheumatoid arthritis (HCC)    Seizures (HCC)    last >15 +  yrs ago- recorded 07-26-2021   Somatization disorder    Tobacco abuse    Past Surgical History:  Procedure Laterality Date   ABDOMINAL HYSTERECTOMY     ANTERIOR HIP REVISION Left 07/25/2016   Procedure: LEFT HIP IRRIGATION AND DEBRIDEMENT, REVISION OF HEAD AND LINER;  Surgeon: Kristin Kos, MD;  Location: [facility] OR;  Service: Orthopedics;  Laterality: Left;   APPLICATION OF WOUND VAC Left 12/23/2019   Procedure: APPLICATION OF WOUND VAC;  Surgeon: Kristin Kos, MD;  Location: [facility] OR;  Service: Orthopedics;  Laterality: Left;   BREAST EXCISIONAL BIOPSY Right    CHOLECYSTECTOMY     COLONOSCOPY     DEBRIDEMENT AND CLOSURE WOUND Left 07/17/2019   Procedure: Excision of left leg wound with ACell placement and primary closure;  Surgeon: Kristin Form, DO;   Location: [facility];  Service: Plastics;  Laterality: Left;  60 min   EXCISIONAL TOTAL HIP ARTHROPLASTY WITH ANTIBIOTIC SPACERS Left 12/23/2019   Procedure: LEFT HIP IRRIGATION AND DEBRIDEMENT LEFT HIP WOUND and poly exchange ;  Surgeon: Kristin Kos, MD;  Location: [facility] OR;  Service: Orthopedics;  Laterality: Left;   HIP DEBRIDEMENT Left 01/19/2018   Procedure(s) Performed: IRRIGATION AND DEBRIDEMENT LEFT HIP (Left )   INCISION AND DRAINAGE HIP Left 08/03/2016   Procedure: IRRIGATION AND DEBRIDEMENT LEFT HIP, VAC PLACEMENT;  Surgeon: Kristin Kos, MD;  Location: [facility] OR;  Service: Orthopedics;  Laterality: Left;   INCISION AND DRAINAGE HIP Left 01/19/2018   Procedure: IRRIGATION AND DEBRIDEMENT LEFT HIP;  Surgeon: Kristin Kos, MD;  Location: [facility] OR;  Service: Orthopedics;  Laterality: Left;   kidney stent Right 2002   TONSILLECTOMY Bilateral 12/06/2014   Procedure: TONSILLECTOMY, BILATERAL;  Surgeon: Kristin Beam, MD;  Location: [facility] OR;  Service: ENT;  Laterality: Bilateral;   TONSILLECTOMY     TOTAL HIP ARTHROPLASTY Left 06/01/2016   Procedure: LEFT TOTAL HIP ARTHROPLASTY ANTERIOR  APPROACH;  Surgeon: Kristin Kos, MD;  Location: [facility] OR;  Service: Orthopedics;  Laterality: Left;   TUBAL LIGATION     WOUND EXPLORATION Left 01/19/2018   Procedure: WOUND EXPLORATION;  Surgeon: Kristin Kos, MD;  Location: [facility] OR;  Service: Orthopedics;  Laterality: Left;   Patient Active Problem List   Diagnosis Date Noted   Recent skin changes 10/15/2022   Urinary retention 12/13/2021   Postoperative anemia due to acute blood loss 12/10/2021   Presence of left artificial hip joint 12/10/2021   Type 2 diabetes mellitus with obesity (HCC) 12/09/2021   Infection and inflammatory reaction due to other internal joint prosthesis, initial encounter (HCC) 12/09/2021   Preop examination 12/09/2021   HTN (hypertension) 12/08/2021   Arthritis 11/26/2021   Seizures (HCC) 11/26/2021   Rash 09/18/2020    Worsening headaches 01/21/2020   Pruritus 10/30/2019   Hypercalcemia 10/24/2019   Body mass index 45.0-49.9, adult (HCC) 08/27/2019   Intertrigo 07/09/2019   Facial rash 07/27/2018   Body mass index 40.0-44.9, adult (HCC) 02/15/2018   Fatigue associated with anemia 02/07/2018   Unspecified open wound, left hip, initial encounter 01/19/2018   Wound of left leg, sequela 10/03/2016   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HCC) 08/24/2016   Status post left hip replacement 06/01/2016   Sinus tachycardia 05/19/2016   History of adenomatous polyp of colon 03/21/2016   Diabetes (HCC) 01/21/2016   Thyroid nodule 12/25/2014   Infiltrate noted on imaging study    Cervical spine arthritis 11/14/2014   Primary osteoarthritis of left hip 11/14/2014   Loss of consciousness (HCC) 11/14/2014   Chest pain 03/26/2014   Musculoskeletal chest and rib pain 03/26/2014   Rib pain on right side 02/28/2014   Groin pain 04/18/2013   UNSPECIFIED RENAL SCLEROSIS 01/16/2009   GERD, SEVERE 05/25/2007   Morbid obesity (HCC) 04/03/2007   Dyslipidemia 08/31/2006   Major depression, recurrent (HCC) 08/31/2006   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   Former smoker 08/31/2006   HYPERTENSION, BENIGN SYSTEMIC 08/31/2006   GASTRIC ULCER ACUTE WITHOUT HEMORRHAGE 08/31/2006   CONVULSIONS, SEIZURES, NOS 08/31/2006    REFERRING DIAG: G95.621 (ICD-10-CM) - Presence of left artificial hip joint  THERAPY DIAG: Presence of left artificial hip joint   Rationale for Evaluation and Treatment Rehabilitation  PERTINENT HISTORY: Patient ID: Kristin [name] is a 67 y.o. female.  Chief Complaint: Follow-up of the Left Hip  Months out from reimplantation over left total hip for chronic periprosthetic joint infection. Otherwise she is doing well. She rates her pain as a 3/10. She has been partial weight-bearing. She has had some accumulation of her seroma since removal of her drains. She has been off all of her antibiotics.  Cultures were negative at the time of surgery   PRECAUTIONS:  posterior hip precautions   WEIGHT BEARING RESTRICTIONS: Yes WBAT      SUBJECTIVE:  SUBJECTIVE STATEMENT: Pt presents to PT with reports of post exercise muscle soreness but otherwise is doing well. Has been compliant with HEP with no adverse effect.     PAIN:  Are you having pain?  Yes: NPRS scale: 5/10 left, right hip 5/10 Pain location: L hip Pain description: ache Aggravating factors: walking Relieving factors: rest and meds   OBJECTIVE: (objective measures completed at initial evaluation unless otherwise dated)   DIAGNOSTIC FINDINGS: none noted   PATIENT SURVEYS:  FOTO 47(51 predicted) FOTO 42 (09/19/22)   COGNITION: Overall cognitive status: Within functional limits for tasks assessed                         SENSATION: Not tested   MUSCLE LENGTH: Hamstrings: Right 80 deg; Left 80 deg   POSTURE:  not assessed   PALPATION: deferred   LOWER EXTREMITY ROM:   Active ROM Right eval Left eval Left 10/10/22 L 11/14/22  Hip flexion 110 85 95 110d  Hip extension      10d(PROM)  Hip abduction        Hip adduction        Hip internal rotation        Hip external rotation        Knee flexion [facility] WFL    Knee extension [facility]    Ankle dorsiflexion        Ankle plantarflexion        Ankle inversion        Ankle eversion         (Blank rows = not tested)   LOWER EXTREMITY MMT:   MMT Right eval Left eval L  11/14/22  Hip flexion   4- 4-  Hip extension   4- 3  Hip abduction   4- 3  Hip adduction       Hip internal rotation       Hip external rotation       Knee flexion   4-   Knee extension   4-   Ankle dorsiflexion       Ankle plantarflexion   4-   Ankle inversion       Ankle eversion        (Blank rows = not  tested)   LOWER EXTREMITY SPECIAL TESTS:  Hip special tests: ITB negative    FUNCTIONAL TESTS: 30s chair stand 6 reps arms crossed; 11/14/22 9 reps arms crossed 09/19/22: 2 MWT 215 feet  ( 1 standing rest break); 11/14/22 275 ft with RW and no need of rest break     GAIT: Distance walked: 38ft  Assistive device utilized: Environmental consultant - 2 wheeled Level of assistance: SBA Comments: slow cadence     TODAY'S TREATMENT:   [facility] Adult PT Treatment:                                                DATE: 11/29/22 Therapeutic Exercise: Nustep L5 LE only x 5 min Forward and lateral hurdle stepping in // - fwd and lat 4 laps ea Step up 4in 2x10 fwd Standing hip abd 2x10 YTB STS 2x10 - no UE support Modified thomas stretch - 2' ea  Standing hip ext 2x10 12.5# Leg press - seat 7 50# - 2x10; 60# 2x10  Knee ext machine - x10 @15 #, 3 x 10 20# (  NT) Knee flexion machine - 3x10 @25 # (NT) Standing hip abd machine - 12.5# - x10 ea (NT)  [facility] Adult PT Treatment:                                                DATE: 11/24/22 Therapeutic Exercise: Nustep L5 LE only x 5 min Forward and lateral hurdle stepping in // - fwd and lat 4 laps ea Standing hip 4 way in // 10x ea (R hip pain) Step up - 4'' step in // - 2x10 ea - fwd and lat Modified thomas stretch - 2' ea  Walking with SPC - CGA - 185'  Leg press - seat 7 50# - 2x10; 60# 2x10 (NT) Knee ext machine - x10 @15 #, 3 x 10 20# (NT) Knee flexion machine - 3x10 @25 # (NT) Standing hip abd machine - 12.5# - x10 ea (NT)  [facility] Adult PT Treatment:                                                DATE: 11/22/22 Therapeutic Exercise: Nustep L5 LE only x 5 min Modified thomas stretch - 2' ea  Leg press - seat 7 50# - 2x10; 60# 2x10 Step ups - 2'' step - 2x10 ea Knee ext machine - x10 @15 #, 3 x 10 20# Knee flexion machine - 3x10 @25 # Standing hip abd machine - 12.5# - x10 ea   PATIENT EDUCATION:  Education details: Discussed eval findings, rehab rationale and POC  and patient is in agreement  Person educated: Patient Education method: Explanation Education comprehension: verbalized understanding and needs further education   HOME EXERCISE PROGRAM: Access Code: [email or web address] URL: [email or web address] Date: 08/01/2022 Prepared by: Gustavus Bryant   Exercises - Sit to Stand Without Arm Support  - 2 x daily - 5 x weekly - 1 sets - 5 reps - Supine Bridge  - 2 x daily - 5 x weekly - 1 sets - 10 reps - Standing Heel Raise with Support  - 2 x daily - 5 x weekly - 1 sets - 10 reps   ASSESSMENT:   CLINICAL IMPRESSION:  Pt was able to complete all prescribed exercises with no adverse effect. Therapy continued to focus on improving proximal hip strength, balance/gait, and functional activity tolerance. Pt continues to to progress well with therapy, will continue to progress as able per POC.   OBJECTIVE IMPAIRMENTS: Abnormal gait, decreased activity tolerance, decreased balance, decreased endurance, decreased mobility, difficulty walking, decreased ROM, decreased strength, impaired perceived functional ability, and pain.    ACTIVITY LIMITATIONS: carrying, lifting, sitting, standing, squatting, sleeping, and stairs   PERSONAL FACTORS: Fitness, Past/current experiences, Time since onset of injury/illness/exacerbation, and 1 comorbidity: DM  are also affecting patient's functional outcome.       GOALS: Goals reviewed with patient? Yes   SHORT TERM GOALS: Target date: 08/22/2022   Patient to demonstrate independence in HEP Baseline:[id number] Goal status: Met   2.  277ft ambulation with RW and SBA Baseline: 29ft with RW and SBA 09/19/22: 215 feet Mod I Goal status: MET      LONG TERM GOALS: Target date: 09/12/2022 (extended to 11/23/2022 then to 01/14/2023)     Increase FOTO score to 51 Baseline: 47 09/19/22:  42 Goal status: ONGOING   2.  Decrease worst pain to 3/10 Baseline: 7/10 09/19/22: 5/10; 10/31/22 3/10 Goal status: Met   3.   Increase RLE strength to 4/5 Baseline:  MMT Right eval Left eval L  11/14/22  Hip flexion   4- 4-  Hip extension   4- 3  Hip abduction   4- 3    Goal status: ONGOING   4.  Patient to ambulate 200 ft with LRAD Baseline: 88ft with RW 09/19/22: 215 ft with RW  Goal status: MET  5. Patient to ambulate 200 ft with cane(revised 11/14/22) Baseline: 13ft with RW 09/19/22: 215 ft with RW  10/10/22: 176 feet with RW, seated rest required  11/14/22; 247ft with RW in 2 min Goal status: *NEW*       PLAN:   PT FREQUENCY: 1x/week   PT DURATION: 4 weeks (01/14/2023)   PLANNED INTERVENTIONS: Therapeutic exercises, Therapeutic activity, Neuromuscular re-education, Balance training, Gait training, Patient/Family education, Self Care, Joint mobilization, Stair training, DME instructions, Aquatic Therapy, and Re-evaluation   PLAN FOR NEXT SESSION: HEP review and update, aerobic tasks, LE strengthening, gait training, balance/proprioceptive work      Eloy End PT 11/29/22 3:58 PM

## 2022-12-05 ENCOUNTER — Other Ambulatory Visit: Payer: Self-pay | Admitting: Student

## 2022-12-05 ENCOUNTER — Ambulatory Visit: Payer: 59 | Attending: Orthopaedic Surgery

## 2022-12-05 DIAGNOSIS — M6281 Muscle weakness (generalized): Secondary | ICD-10-CM | POA: Insufficient documentation

## 2022-12-05 DIAGNOSIS — Z96649 Presence of unspecified artificial hip joint: Secondary | ICD-10-CM | POA: Insufficient documentation

## 2022-12-05 DIAGNOSIS — M25552 Pain in left hip: Secondary | ICD-10-CM | POA: Diagnosis present

## 2022-12-05 NOTE — Therapy (Signed)
Treatment   Patient Name: Kristin [name] MRN: [id number] DOB:1955/08/09, 67 y.o., female Today's Date: 12/05/2022  PCP: Alfredo Martinez, MD    REFERRING PROVIDER: Glee Arvin, MD  END OF SESSION:   PT End of Session - 12/05/22 0956     Visit Number 23    Number of Visits 24    Date for PT Re-Evaluation 01/14/23    Authorization Type [employer] MCR    PT Start Time 1000    PT Stop Time 1040    PT Time Calculation (min) 40 min    Activity Tolerance Patient tolerated treatment well;Patient limited by fatigue    Behavior During Therapy WFL for tasks assessed/performed                   Past Medical History:  Diagnosis Date   Allergy    seasonal   Anemia    Anxiety    Arthritis    Back pain, chronic    Borderline diabetes    Bronchitis    Constipation    current issue   Cough    Depression    Epilepsy (HCC)    GERD (gastroesophageal reflux disease)    Hyperlipidemia    Hypertension    IBS (irritable bowel syndrome)    Joint pain    Obesity    Palpitations    Pneumonia 2016   Pre-diabetes    Renal sclerosis, unspecified    only has 1 kidney   Rheumatoid arthritis (HCC)    Seizures (HCC)    last >15 +  yrs ago- recorded 07-26-2021   Somatization disorder    Tobacco abuse    Past Surgical History:  Procedure Laterality Date   ABDOMINAL HYSTERECTOMY     ANTERIOR HIP REVISION Left 07/25/2016   Procedure: LEFT HIP IRRIGATION AND DEBRIDEMENT, REVISION OF HEAD AND LINER;  Surgeon: Tarry Kos, MD;  Location: [facility] OR;  Service: Orthopedics;  Laterality: Left;   APPLICATION OF WOUND VAC Left 12/23/2019   Procedure: APPLICATION OF WOUND VAC;  Surgeon: Tarry Kos, MD;  Location: [facility] OR;  Service: Orthopedics;  Laterality: Left;   BREAST EXCISIONAL BIOPSY Right    CHOLECYSTECTOMY     COLONOSCOPY     DEBRIDEMENT AND CLOSURE WOUND Left 07/17/2019   Procedure: Excision of left leg wound with ACell placement and primary closure;  Surgeon: Peggye Form, DO;   Location: [facility];  Service: Plastics;  Laterality: Left;  60 min   EXCISIONAL TOTAL HIP ARTHROPLASTY WITH ANTIBIOTIC SPACERS Left 12/23/2019   Procedure: LEFT HIP IRRIGATION AND DEBRIDEMENT LEFT HIP WOUND and poly exchange ;  Surgeon: Tarry Kos, MD;  Location: [facility] OR;  Service: Orthopedics;  Laterality: Left;   HIP DEBRIDEMENT Left 01/19/2018   Procedure(s) Performed: IRRIGATION AND DEBRIDEMENT LEFT HIP (Left )   INCISION AND DRAINAGE HIP Left 08/03/2016   Procedure: IRRIGATION AND DEBRIDEMENT LEFT HIP, VAC PLACEMENT;  Surgeon: Tarry Kos, MD;  Location: [facility] OR;  Service: Orthopedics;  Laterality: Left;   INCISION AND DRAINAGE HIP Left 01/19/2018   Procedure: IRRIGATION AND DEBRIDEMENT LEFT HIP;  Surgeon: Tarry Kos, MD;  Location: [facility] OR;  Service: Orthopedics;  Laterality: Left;   kidney stent Right 2002   TONSILLECTOMY Bilateral 12/06/2014   Procedure: TONSILLECTOMY, BILATERAL;  Surgeon: Melvenia Beam, MD;  Location: [facility] OR;  Service: ENT;  Laterality: Bilateral;   TONSILLECTOMY     TOTAL HIP ARTHROPLASTY Left 06/01/2016   Procedure: LEFT TOTAL HIP ARTHROPLASTY  ANTERIOR APPROACH;  Surgeon: Tarry Kos, MD;  Location: [facility] OR;  Service: Orthopedics;  Laterality: Left;   TUBAL LIGATION     WOUND EXPLORATION Left 01/19/2018   Procedure: WOUND EXPLORATION;  Surgeon: Tarry Kos, MD;  Location: [facility] OR;  Service: Orthopedics;  Laterality: Left;   Patient Active Problem List   Diagnosis Date Noted   Recent skin changes 10/15/2022   Urinary retention 12/13/2021   Postoperative anemia due to acute blood loss 12/10/2021   Presence of left artificial hip joint 12/10/2021   Type 2 diabetes mellitus with obesity (HCC) 12/09/2021   Infection and inflammatory reaction due to other internal joint prosthesis, initial encounter (HCC) 12/09/2021   Preop examination 12/09/2021   HTN (hypertension) 12/08/2021   Arthritis 11/26/2021   Seizures (HCC) 11/26/2021   Rash 09/18/2020    Worsening headaches 01/21/2020   Pruritus 10/30/2019   Hypercalcemia 10/24/2019   Body mass index 45.0-49.9, adult (HCC) 08/27/2019   Intertrigo 07/09/2019   Facial rash 07/27/2018   Body mass index 40.0-44.9, adult (HCC) 02/15/2018   Fatigue associated with anemia 02/07/2018   Unspecified open wound, left hip, initial encounter 01/19/2018   Wound of left leg, sequela 10/03/2016   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HCC) 08/24/2016   Status post left hip replacement 06/01/2016   Sinus tachycardia 05/19/2016   History of adenomatous polyp of colon 03/21/2016   Diabetes (HCC) 01/21/2016   Thyroid nodule 12/25/2014   Infiltrate noted on imaging study    Cervical spine arthritis 11/14/2014   Primary osteoarthritis of left hip 11/14/2014   Loss of consciousness (HCC) 11/14/2014   Chest pain 03/26/2014   Musculoskeletal chest and rib pain 03/26/2014   Rib pain on right side 02/28/2014   Groin pain 04/18/2013   UNSPECIFIED RENAL SCLEROSIS 01/16/2009   GERD, SEVERE 05/25/2007   Morbid obesity (HCC) 04/03/2007   Dyslipidemia 08/31/2006   Major depression, recurrent (HCC) 08/31/2006   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   Former smoker 08/31/2006   HYPERTENSION, BENIGN SYSTEMIC 08/31/2006   GASTRIC ULCER ACUTE WITHOUT HEMORRHAGE 08/31/2006   CONVULSIONS, SEIZURES, NOS 08/31/2006    REFERRING DIAG: Z61.096 (ICD-10-CM) - Presence of left artificial hip joint  THERAPY DIAG: Presence of left artificial hip joint   Rationale for Evaluation and Treatment Rehabilitation  PERTINENT HISTORY: Patient ID: Kristin [name] is a 67 y.o. female.  Chief Complaint: Follow-up of the Left Hip  Months out from reimplantation over left total hip for chronic periprosthetic joint infection. Otherwise she is doing well. She rates her pain as a 3/10. She has been partial weight-bearing. She has had some accumulation of her seroma since removal of her drains. She has been off all of her antibiotics.  Cultures were negative at the time of surgery   PRECAUTIONS:  posterior hip precautions   WEIGHT BEARING RESTRICTIONS: Yes WBAT      SUBJECTIVE:  SUBJECTIVE STATEMENT: Pt presents to PT with continued L hip pain, although it is less than last visit. Has been compliant with HEP.   PAIN:  Are you having pain?  Yes: NPRS scale: 3/10  Pain location: L hip Pain description: ache Aggravating factors: walking Relieving factors: rest and meds   OBJECTIVE: (objective measures completed at initial evaluation unless otherwise dated)   DIAGNOSTIC FINDINGS: none noted   PATIENT SURVEYS:  FOTO 47(51 predicted) FOTO 42 (09/19/22)   COGNITION: Overall cognitive status: Within functional limits for tasks assessed                         SENSATION: Not tested   MUSCLE LENGTH: Hamstrings: Right 80 deg; Left 80 deg   POSTURE:  not assessed   PALPATION: deferred   LOWER EXTREMITY ROM:   Active ROM Right eval Left eval Left 10/10/22 L 11/14/22  Hip flexion 110 85 95 110d  Hip extension      10d(PROM)  Hip abduction        Hip adduction        Hip internal rotation        Hip external rotation        Knee flexion [facility] WFL    Knee extension [facility]    Ankle dorsiflexion        Ankle plantarflexion        Ankle inversion        Ankle eversion         (Blank rows = not tested)   LOWER EXTREMITY MMT:   MMT Right eval Left eval L  11/14/22  Hip flexion   4- 4-  Hip extension   4- 3  Hip abduction   4- 3  Hip adduction       Hip internal rotation       Hip external rotation       Knee flexion   4-   Knee extension   4-   Ankle dorsiflexion       Ankle plantarflexion   4-   Ankle inversion       Ankle eversion        (Blank rows = not tested)   LOWER EXTREMITY SPECIAL TESTS:  Hip special  tests: ITB negative    FUNCTIONAL TESTS: 30s chair stand 6 reps arms crossed; 11/14/22 9 reps arms crossed 09/19/22: 2 MWT 215 feet  ( 1 standing rest break); 11/14/22 275 ft with RW and no need of rest break     GAIT: Distance walked: 16ft  Assistive device utilized: Environmental consultant - 2 wheeled Level of assistance: SBA Comments: slow cadence     TODAY'S TREATMENT:   [facility] Adult PT Treatment:                                                DATE: 12/05/22 Therapeutic Exercise: Nustep L6 LE only x 4 min Seated knee ext 2x10 20# Seated knee flexion x10 25#; x10 30# Gait rolled into therex amb 222ft with SPC in R hand - CGA Standing hip abd 2x10 12.5# Leg press - seat 7 - 40# x 10; 45# x 10 STS 2x10 - 10# DB Modified thomas stretch - 2' ea  Bridge with GTB 3x10  [facility] Adult PT Treatment:  DATE: 11/29/22 Therapeutic Exercise: Nustep L5 LE only x 5 min Forward and lateral hurdle stepping in // - fwd and lat 4 laps ea Step up 4in 2x10 fwd Standing hip abd 2x10 YTB STS 2x10 - no UE support Modified thomas stretch - 2' ea  Standing hip ext 2x10 12.5# Leg press - seat 7 50# - 2x10; 60# 2x10  Knee ext machine - x10 @15 #, 3 x 10 20# (NT) Knee flexion machine - 3x10 @25 # (NT) Standing hip abd machine - 12.5# - x10 ea (NT)  [facility] Adult PT Treatment:                                                DATE: 11/24/22 Therapeutic Exercise: Nustep L5 LE only x 5 min Forward and lateral hurdle stepping in // - fwd and lat 4 laps ea Standing hip 4 way in // 10x ea (R hip pain) Step up - 4'' step in // - 2x10 ea - fwd and lat Modified thomas stretch - 2' ea  Walking with SPC - CGA - 185'  Leg press - seat 7 50# - 2x10; 60# 2x10 (NT) Knee ext machine - x10 @15 #, 3 x 10 20# (NT) Knee flexion machine - 3x10 @25 # (NT) Standing hip abd machine - 12.5# - x10 ea (NT)  [facility] Adult PT Treatment:                                                DATE: 11/22/22 Therapeutic  Exercise: Nustep L5 LE only x 5 min Modified thomas stretch - 2' ea  Leg press - seat 7 50# - 2x10; 60# 2x10 Step ups - 2'' step - 2x10 ea Knee ext machine - x10 @15 #, 3 x 10 20# Knee flexion machine - 3x10 @25 # Standing hip abd machine - 12.5# - x10 ea   PATIENT EDUCATION:  Education details: Discussed eval findings, rehab rationale and POC and patient is in agreement  Person educated: Patient Education method: Explanation Education comprehension: verbalized understanding and needs further education   HOME EXERCISE PROGRAM: Access Code: [email or web address] URL: [email or web address] Date: 08/01/2022 Prepared by: Gustavus Bryant   Exercises - Sit to Stand Without Arm Support  - 2 x daily - 5 x weekly - 1 sets - 5 reps - Supine Bridge  - 2 x daily - 5 x weekly - 1 sets - 10 reps - Standing Heel Raise with Support  - 2 x daily - 5 x weekly - 1 sets - 10 reps   ASSESSMENT:   CLINICAL IMPRESSION:  Pt was able to complete all prescribed exercises with no adverse effect. Therapy continued to focus on improving proximal hip strength, balance/gait, and functional activity tolerance. Pt continues to to progress well with therapy, will continue to progress as able per POC.   OBJECTIVE IMPAIRMENTS: Abnormal gait, decreased activity tolerance, decreased balance, decreased endurance, decreased mobility, difficulty walking, decreased ROM, decreased strength, impaired perceived functional ability, and pain.    ACTIVITY LIMITATIONS: carrying, lifting, sitting, standing, squatting, sleeping, and stairs   PERSONAL FACTORS: Fitness, Past/current experiences, Time since onset of injury/illness/exacerbation, and 1 comorbidity: DM  are also affecting patient's functional outcome.       GOALS: Goals reviewed with patient?  Yes   SHORT TERM GOALS: Target date: 08/22/2022   Patient to demonstrate independence in HEP Baseline:[id number] Goal status: Met   2.  254ft ambulation with RW and  SBA Baseline: 67ft with RW and SBA 09/19/22: 215 feet Mod I Goal status: MET      LONG TERM GOALS: Target date: 09/12/2022 (extended to 11/23/2022 then to 01/14/2023)     Increase FOTO score to 51 Baseline: 47 09/19/22: 42 Goal status: ONGOING   2.  Decrease worst pain to 3/10 Baseline: 7/10 09/19/22: 5/10; 10/31/22 3/10 Goal status: Met   3.  Increase RLE strength to 4/5 Baseline:  MMT Right eval Left eval L  11/14/22  Hip flexion   4- 4-  Hip extension   4- 3  Hip abduction   4- 3    Goal status: ONGOING   4.  Patient to ambulate 200 ft with LRAD Baseline: 97ft with RW 09/19/22: 215 ft with RW  Goal status: MET  5. Patient to ambulate 200 ft with cane(revised 11/14/22) Baseline: 84ft with RW 09/19/22: 215 ft with RW  10/10/22: 176 feet with RW, seated rest required  11/14/22; 29ft with RW in 2 min Goal status: *NEW*       PLAN:   PT FREQUENCY: 1x/week   PT DURATION: 4 weeks (01/14/2023)   PLANNED INTERVENTIONS: Therapeutic exercises, Therapeutic activity, Neuromuscular re-education, Balance training, Gait training, Patient/Family education, Self Care, Joint mobilization, Stair training, DME instructions, Aquatic Therapy, and Re-evaluation   PLAN FOR NEXT SESSION: HEP review and update, aerobic tasks, LE strengthening, gait training, balance/proprioceptive work      Eloy End PT 12/05/22 10:43 AM

## 2022-12-09 NOTE — Therapy (Addendum)
Treatment   Patient Name: Kristin [name] MRN: [id number] DOB:[birth year], 67 y.o., female Today's Date: 12/12/2022  PCP: Alfredo Martinez, MD    REFERRING PROVIDER: Glee Arvin, MD PHYSICAL THERAPY DISCHARGE SUMMARY  Visits from Start of Care: 24  Current functional level related to goals / functional outcomes: Goals partially met   Remaining deficits: strength   Education / Equipment: HEP   Patient agrees to discharge. Patient goals were partially met. Patient is being discharged due to maximized rehab potential.   END OF SESSION:   PT End of Session - 12/12/22 0955     Visit Number 24    Number of Visits 24    Date for PT Re-Evaluation 01/14/23    Authorization Type [employer] MCR    Activity Tolerance Patient tolerated treatment well;Patient limited by fatigue    Behavior During Therapy [facility] for tasks assessed/performed                   Past Medical History:  Diagnosis Date   Allergy    seasonal   Anemia    Anxiety    Arthritis    Back pain, chronic    Borderline diabetes    Bronchitis    Constipation    current issue   Cough    Depression    Epilepsy (HCC)    GERD (gastroesophageal reflux disease)    Hyperlipidemia    Hypertension    IBS (irritable bowel syndrome)    Joint pain    Obesity    Palpitations    Pneumonia 2016   Pre-diabetes    Renal sclerosis, unspecified    only has 1 kidney   Rheumatoid arthritis (HCC)    Seizures (HCC)    last >15 +  yrs ago- recorded 07-26-2021   Somatization disorder    Tobacco abuse    Past Surgical History:  Procedure Laterality Date   ABDOMINAL HYSTERECTOMY     ANTERIOR HIP REVISION Left 07/25/2016   Procedure: LEFT HIP IRRIGATION AND DEBRIDEMENT, REVISION OF HEAD AND LINER;  Surgeon: Tarry Kos, MD;  Location: [facility] OR;  Service: Orthopedics;  Laterality: Left;   APPLICATION OF WOUND VAC Left 12/23/2019   Procedure: APPLICATION OF WOUND VAC;  Surgeon: Tarry Kos, MD;  Location: [facility] OR;  Service:  Orthopedics;  Laterality: Left;   BREAST EXCISIONAL BIOPSY Right    CHOLECYSTECTOMY     COLONOSCOPY     DEBRIDEMENT AND CLOSURE WOUND Left 07/17/2019   Procedure: Excision of left leg wound with ACell placement and primary closure;  Surgeon: Peggye Form, DO;  Location: [facility];  Service: Plastics;  Laterality: Left;  60 min   EXCISIONAL TOTAL HIP ARTHROPLASTY WITH ANTIBIOTIC SPACERS Left 12/23/2019   Procedure: LEFT HIP IRRIGATION AND DEBRIDEMENT LEFT HIP WOUND and poly exchange ;  Surgeon: Tarry Kos, MD;  Location: [facility] OR;  Service: Orthopedics;  Laterality: Left;   HIP DEBRIDEMENT Left 01/19/2018   Procedure(s) Performed: IRRIGATION AND DEBRIDEMENT LEFT HIP (Left )   INCISION AND DRAINAGE HIP Left 08/03/2016   Procedure: IRRIGATION AND DEBRIDEMENT LEFT HIP, VAC PLACEMENT;  Surgeon: Tarry Kos, MD;  Location: [facility];  Service: Orthopedics;  Laterality: Left;   INCISION AND DRAINAGE HIP Left 01/19/2018   Procedure: IRRIGATION AND DEBRIDEMENT LEFT HIP;  Surgeon: Tarry Kos, MD;  Location: [facility];  Service: Orthopedics;  Laterality: Left;   kidney stent Right 2002   TONSILLECTOMY Bilateral 12/06/2014   Procedure: TONSILLECTOMY, BILATERAL;  Surgeon: Melvenia Beam, MD;  Location: [facility];  Service: ENT;  Laterality: Bilateral;   TONSILLECTOMY     TOTAL HIP ARTHROPLASTY Left 06/01/2016   Procedure: LEFT TOTAL HIP ARTHROPLASTY ANTERIOR APPROACH;  Surgeon: Tarry Kos, MD;  Location: [facility];  Service: Orthopedics;  Laterality: Left;   TUBAL LIGATION     WOUND EXPLORATION Left 01/19/2018   Procedure: WOUND EXPLORATION;  Surgeon: Tarry Kos, MD;  Location: [facility];  Service: Orthopedics;  Laterality: Left;   Patient Active Problem List   Diagnosis Date Noted   Recent skin changes 10/15/2022   Urinary retention 12/13/2021   Postoperative anemia due to acute blood loss 12/10/2021   Presence of left artificial hip joint 12/10/2021   Type 2 diabetes mellitus with obesity  (HCC) 12/09/2021   Infection and inflammatory reaction due to other internal joint prosthesis, initial encounter (HCC) 12/09/2021   Preop examination 12/09/2021   HTN (hypertension) 12/08/2021   Arthritis 11/26/2021   Seizures (HCC) 11/26/2021   Rash 09/18/2020   Worsening headaches 01/21/2020   Pruritus 10/30/2019   Hypercalcemia 10/24/2019   Body mass index 45.0-49.9, adult (HCC) 08/27/2019   Intertrigo 07/09/2019   Facial rash 07/27/2018   Body mass index 40.0-44.9, adult (HCC) 02/15/2018   Fatigue associated with anemia 02/07/2018   Unspecified open wound, left hip, initial encounter 01/19/2018   Wound of left leg, sequela 10/03/2016   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HCC) 08/24/2016   Status post left hip replacement 06/01/2016   Sinus tachycardia 05/19/2016   History of adenomatous polyp of colon 03/21/2016   Diabetes (HCC) 01/21/2016   Thyroid nodule 12/25/2014   Infiltrate noted on imaging study    Cervical spine arthritis 11/14/2014   Primary osteoarthritis of left hip 11/14/2014   Loss of consciousness (HCC) 11/14/2014   Chest pain 03/26/2014   Musculoskeletal chest and rib pain 03/26/2014   Rib pain on right side 02/28/2014   Groin pain 04/18/2013   UNSPECIFIED RENAL SCLEROSIS 01/16/2009   GERD, SEVERE 05/25/2007   Morbid obesity (HCC) 04/03/2007   Dyslipidemia 08/31/2006   Major depression, recurrent (HCC) 08/31/2006   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   Former smoker 08/31/2006   HYPERTENSION, BENIGN SYSTEMIC 08/31/2006   GASTRIC ULCER ACUTE WITHOUT HEMORRHAGE 08/31/2006   CONVULSIONS, SEIZURES, NOS 08/31/2006    REFERRING DIAG: I69.629 (ICD-10-CM) - Presence of left artificial hip joint  THERAPY DIAG: Presence of left artificial hip joint   Rationale for Evaluation and Treatment Rehabilitation  PERTINENT HISTORY: Patient ID: Kristin [name] is a 67 y.o. female.  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 Otherwise she is doing well. She rates her pain as a 3/10. She has been partial weight-bearing. She has had some accumulation of her seroma since removal of her drains. She has been off all of her antibiotics. Cultures were negative at the time of surgery   PRECAUTIONS:  posterior hip precautions   WEIGHT BEARING RESTRICTIONS: Yes WBAT      SUBJECTIVE:  SUBJECTIVE STATEMENT: Continues to note a throbbing discomfort in her L hip described as soreness.  Scheduled to return to [facility] but will have to reschedule.  PAIN:  Are you having pain?  Yes: NPRS scale: 3/10  Pain location: L hip Pain description: ache Aggravating factors: walking Relieving factors: rest and meds   OBJECTIVE: (objective measures completed at initial evaluation unless otherwise dated)   DIAGNOSTIC FINDINGS: none noted   PATIENT SURVEYS:  FOTO 47(51 predicted) FOTO 42 (09/19/22) 12/21/22 41%   COGNITION: Overall cognitive status: Within functional limits for tasks assessed                         SENSATION: Not tested   MUSCLE LENGTH: Hamstrings: Right 80 deg; Left 80 deg   POSTURE:  not assessed   PALPATION: deferred   LOWER EXTREMITY ROM:   Active ROM Right eval Left eval Left 10/10/22 L 11/14/22 L 12/12/22  Hip flexion 110 85 95 110d 120d PROM  Hip extension      10d(PROM)   Hip abduction         Hip adduction         Hip internal rotation         Hip external rotation         Knee flexion [facility] WFL     Knee extension [facility]     Ankle dorsiflexion         Ankle plantarflexion         Ankle inversion         Ankle eversion          (Blank rows = not tested)   LOWER EXTREMITY MMT:   MMT Right eval Left eval L  11/14/22 L 12/12/22  Hip flexion    4- 4- 4-  Hip extension   4- 3 3  Hip abduction   4- 3 3  Hip adduction        Hip internal rotation        Hip external rotation        Knee flexion   4-    Knee extension   4-    Ankle dorsiflexion        Ankle plantarflexion   4-    Ankle inversion        Ankle eversion         (Blank rows = not tested)   LOWER EXTREMITY SPECIAL TESTS:  Hip special tests: ITB negative    FUNCTIONAL TESTS: 30s chair stand 6 reps arms crossed; 11/14/22 9 reps arms crossed; 12/12/22 8 reps arms crossed 09/19/22: 2 MWT 215 feet  ( 1 standing rest break); 11/14/22 275 ft with RW and no need of rest break; 12/12/22 265ft using RW     GAIT: Distance walked: 81ft  Assistive device utilized: Environmental consultant - 2 wheeled Level of assistance: SBA Comments: slow cadence     TODAY'S TREATMENT:   [facility] Adult PT Treatment:                                                DATE: 12/12/22 Therapeutic Exercise: Supine SLR 10x S/L L abduction 10x S/L L clams 10x Single leg bridge 10x Standing heel raises 10x  Therapeutic Activity: Re-Assessment  [facility] Adult PT Treatment:  DATE: 12/05/22 Therapeutic Exercise: Nustep L6 LE only x 4 min Seated knee ext 2x10 20# Seated knee flexion x10 25#; x10 30# Gait rolled into therex amb 243ft with SPC in R hand - CGA Standing hip abd 2x10 12.5# Leg press - seat 7 - 40# x 10; 45# x 10 STS 2x10 - 10# DB Modified thomas stretch - 2' ea  Bridge with GTB 3x10  [facility] Adult PT Treatment:                                                DATE: 11/29/22 Therapeutic Exercise: Nustep L5 LE only x 5 min Forward and lateral hurdle stepping in // - fwd and lat 4 laps ea Step up 4in 2x10 fwd Standing hip abd 2x10 YTB STS 2x10 - no UE support Modified thomas stretch - 2' ea  Standing hip ext 2x10 12.5# Leg press - seat 7 50# - 2x10; 60# 2x10  Knee ext machine - x10 @15 #, 3 x 10 20# (NT) Knee flexion machine - 3x10 @25 # (NT) Standing hip abd machine  - 12.5# - x10 ea (NT)  [facility] Adult PT Treatment:                                                DATE: 11/24/22 Therapeutic Exercise: Nustep L5 LE only x 5 min Forward and lateral hurdle stepping in // - fwd and lat 4 laps ea Standing hip 4 way in // 10x ea (R hip pain) Step up - 4'' step in // - 2x10 ea - fwd and lat Modified thomas stretch - 2' ea  Walking with SPC - CGA - 185'  Leg press - seat 7 50# - 2x10; 60# 2x10 (NT) Knee ext machine - x10 @15 #, 3 x 10 20# (NT) Knee flexion machine - 3x10 @25 # (NT) Standing hip abd machine - 12.5# - x10 ea (NT)  [facility] Adult PT Treatment:                                                DATE: 11/22/22 Therapeutic Exercise: Nustep L5 LE only x 5 min Modified thomas stretch - 2' ea  Leg press - seat 7 50# - 2x10; 60# 2x10 Step ups - 2'' step - 2x10 ea Knee ext machine - x10 @15 #, 3 x 10 20# Knee flexion machine - 3x10 @25 # Standing hip abd machine - 12.5# - x10 ea   PATIENT EDUCATION:  Education details: Discussed eval findings, rehab rationale and POC and patient is in agreement  Person educated: Patient Education method: Explanation Education comprehension: verbalized understanding and needs further education   HOME EXERCISE PROGRAM: Access Code: [email or web address] URL: [email or web address] Date: 08/01/2022 Prepared by: Gustavus Bryant   Exercises - Sit to Stand Without Arm Support  - 2 x daily - 5 x weekly - 1 sets - 5 reps - Supine Bridge  - 2 x daily - 5 x weekly - 1 sets - 10 reps - Standing Heel Raise with Support  - 2 x daily - 5 x weekly - 1 sets - 10 reps   ASSESSMENT:  CLINICAL IMPRESSION: Goals and progress re-assessed.  Patient has regressed in 30s chair stand test and FOTO, strength grades unchanged and mild increase in 2 MWT noted but unable to perform w/o use of RW.  Patient unable to transition out of RW due to apprehension and continued L hip symptoms.  PROM in L hip WFL.  Has been referred back to [facility] due  to suspected prosthesis infection.  Recommend placing PT on hold until patient returns to ortho to assess ongoing L hip swelling, discomfort and weakness following next PT appointment.  Discussed POC with patient and she is in agreement.  OBJECTIVE IMPAIRMENTS: Abnormal gait, decreased activity tolerance, decreased balance, decreased endurance, decreased mobility, difficulty walking, decreased ROM, decreased strength, impaired perceived functional ability, and pain.    ACTIVITY LIMITATIONS: carrying, lifting, sitting, standing, squatting, sleeping, and stairs   PERSONAL FACTORS: Fitness, Past/current experiences, Time since onset of injury/illness/exacerbation, and 1 comorbidity: DM  are also affecting patient's functional outcome.       GOALS: Goals reviewed with patient? Yes   SHORT TERM GOALS: Target date: 08/22/2022   Patient to demonstrate independence in HEP Baseline:[id number] Goal status: Met   2.  273ft ambulation with RW and SBA Baseline: 54ft with RW and SBA 09/19/22: 215 feet Mod I Goal status: MET      LONG TERM GOALS: Target date: 09/12/2022 (extended to 11/23/2022 then to 01/14/2023)     Increase FOTO score to 51 Baseline: 47 09/19/22: 42 Goal status: ONGOING   2.  Decrease worst pain to 3/10 Baseline: 7/10 09/19/22: 5/10; 10/31/22 3/10 Goal status: Met   3.  Increase RLE strength to 4/5 Baseline:  MMT Right eval Left eval L  11/14/22 L 12/12/22  Hip flexion   4- 4- 4-  Hip extension   4- 3 3  Hip abduction   4- 3 3    Goal status: ONGOING   4.  Patient to ambulate 200 ft with LRAD Baseline: 52ft with RW 09/19/22: 215 ft with RW  Goal status: MET  5. Patient to ambulate 200 ft with cane(revised 11/14/22) Baseline: 25ft with RW 09/19/22: 215 ft with RW  10/10/22: 176 feet with RW, seated rest required  11/14/22; 272ft with RW in 2 min 12/12/22 264ft with RW Goal status: Ongoing       PLAN:   PT FREQUENCY: 1x/week   PT DURATION: 4 weeks (01/14/2023)    PLANNED INTERVENTIONS: Therapeutic exercises, Therapeutic activity, Neuromuscular re-education, Balance training, Gait training, Patient/Family education, Self Care, Joint mobilization, Stair training, DME instructions, Aquatic Therapy, and Re-evaluation   PLAN FOR NEXT SESSION: HEP review and update, aerobic tasks, LE strengthening, gait training, balance/proprioceptive work      Hildred Laser PT 12/12/22 11:08 AM

## 2022-12-12 ENCOUNTER — Ambulatory Visit: Payer: 59

## 2022-12-12 DIAGNOSIS — M6281 Muscle weakness (generalized): Secondary | ICD-10-CM

## 2022-12-12 DIAGNOSIS — M25552 Pain in left hip: Secondary | ICD-10-CM

## 2022-12-12 DIAGNOSIS — Z96649 Presence of unspecified artificial hip joint: Secondary | ICD-10-CM | POA: Diagnosis not present

## 2022-12-19 ENCOUNTER — Ambulatory Visit: Payer: 59

## 2023-01-09 ENCOUNTER — Telehealth: Payer: Self-pay | Admitting: Student

## 2023-01-09 ENCOUNTER — Encounter: Payer: Self-pay | Admitting: Student

## 2023-01-09 ENCOUNTER — Ambulatory Visit (INDEPENDENT_AMBULATORY_CARE_PROVIDER_SITE_OTHER): Payer: 59 | Admitting: Student

## 2023-01-09 ENCOUNTER — Other Ambulatory Visit: Payer: Self-pay | Admitting: Student

## 2023-01-09 VITALS — BP 132/82 | HR 95 | Ht 62.0 in | Wt 263.0 lb

## 2023-01-09 DIAGNOSIS — E119 Type 2 diabetes mellitus without complications: Secondary | ICD-10-CM

## 2023-01-09 LAB — POCT GLYCOSYLATED HEMOGLOBIN (HGB A1C): HbA1c, POC (controlled diabetic range): 6.1 % (ref 0.0–7.0)

## 2023-01-09 MED ORDER — SEMAGLUTIDE(0.25 OR 0.5MG/DOS) 2 MG/1.5ML ~~LOC~~ SOPN
0.2500 mg | PEN_INJECTOR | SUBCUTANEOUS | 3 refills | Status: DC
Start: 2023-01-09 — End: 2023-02-13

## 2023-01-09 NOTE — Patient Instructions (Addendum)
It was great to see you today! Thank you for choosing [facility] for your primary care. Kristin [name] was seen for follow up.  Today we addressed: You need a mammogram to prevent breast cancer.  Please schedule an appointment.  You can call [phone].     A1C check  Stay on the metformin until we are able to get Ozempic authorized by insurance  I will refer you to the nutritionist as well   If you haven't already, sign up for My Chart to have easy access to your labs results, and communication with your primary care physician.  I recommend that you always bring your medications to each appointment as this makes it easy to ensure you are on the correct medications and helps [place] not miss refills when you need them. Call the clinic at [phone] if your symptoms worsen or you have any concerns.  You should return to our clinic Return in about 4 weeks (around 02/06/2023). Please arrive 15 minutes before your appointment to ensure smooth check in process.  We appreciate your efforts in making this happen.  Thank you for allowing me to participate in your care, Alfredo Martinez, MD 01/09/2023, 4:07 PM PGY-2, [facility]

## 2023-01-09 NOTE — Telephone Encounter (Signed)
Tried to call patient for her virtual appt x2 and left VM to call me back. Will need to reschedule her appt.

## 2023-01-09 NOTE — Assessment & Plan Note (Signed)
well controlled, needs weight loss assistance. Referral to Dr. Gerilyn Pilgrim, provided with her phone number to call in office.  - Last A1c:  Lab Results  Component Value Date   HGBA1C 6.1 01/09/2023   - Medications: Metformin, ordered Ozempic 0.25 mg and then will work off of Metformin - Compliance: Good  - A1C ordered

## 2023-01-09 NOTE — Progress Notes (Signed)
  SUBJECTIVE:   CHIEF COMPLAINT / HPI:   Type 2 Diabetes: Home medications include: Metformin 500 BID. Does endorse compliance. Asking about assistance with weight loss, wants to try nutrition assistance and GLP1. No pancreatitis or HEENT cancer history.   Most recent A1Cs:  Lab Results  Component Value Date   HGBA1C 6.1 01/09/2023   HGBA1C 6.0 10/14/2022   Last Microalbumin, LDL, Creatinine: Lab Results  Component Value Date   MICROALBUR 30 10/30/2019   LDLCALC 61 07/19/2022   CREATININE 0.96 07/19/2022    Patient is up to date on diabetic eye. Patient is up to date on diabetic foot exam.  Recurrent prosthetic hip infection No fevers or chills Walking OK with a walker.  S/p reimplantation for infection of her left hip back in early November with Dr. Brandy Hale. She was on antibiotics postoperatively specifically doxycycline for a total of 6 months, is seen through [facility].   Forgetfulness  -Noticing it over the last six months  -Not remembering what family members told her recently  -Grandmother has a history of dementia -Asking about Prevagen for memory  -No confusion, syncope, difficulty with faces, unilateral weakness, sensation loss.    PERTINENT  PMH / PSH:  renal sclerosis?, HTN, GERD, DM2   Patient Care Team: Alfredo Martinez, MD as PCP - General (Family Medicine) OBJECTIVE:  BP 132/82 (BP Location: Left Arm, Patient Position: Sitting, Cuff Size: Large)   Pulse 95   Ht 5\' 2"  (1.575 m)   Wt 263 lb (119.3 kg)   SpO2 96%   BMI 48.10 kg/m  Physical Exam  General: Alert and oriented in no apparent distress Heart: Regular rate and rhythm with no murmurs appreciated Lungs: CTA bilaterally Abdomen: no abdominal pain Skin: Warm and dry Neuro:  CN II: PERRL CN III, IV,VI: EOMI CV V: Normal sensation in V1, V2, V3 CVII: Symmetric smile and brow raise CN IX,X: Symmetric palate raise  CN XI: 5/5 shoulder shrug CN XII: Symmetric tongue protrusion  UE and LE  strength 5/5 Normal sensation in UE and LE bilaterally     ASSESSMENT/PLAN:  Type 2 diabetes mellitus without complication, without long-term current use of insulin (HCC) Assessment & Plan: well controlled, needs weight loss assistance. Referral to Dr. Gerilyn Pilgrim, provided with her phone number to call in office.  - Last A1c:  Lab Results  Component Value Date   HGBA1C 6.1 01/09/2023   - Medications: Metformin, ordered Ozempic 0.25 mg and then will work off of Metformin - Compliance: Good  - A1C ordered    Orders: -     POCT glycosylated hemoglobin (Hb A1C) -     Semaglutide(0.25 or 0.5MG /DOS); Inject 0.25 mg into the skin once a week. 0.25 mg once weekly for 4 weeks then increase to 0.5 mg weekly for at least 4 weeks,max 1 mg  Dispense: 3 mL; Refill: 3 -     Amb ref to Medical Nutrition Therapy-MNT   Forgetfulness: Neuro exam unremarkable, no red flags. Continue monitoring and reported that I would not recommend Prevagen.   Return in about 4 weeks (around 02/06/2023). Alfredo Martinez, MD 01/09/2023, 4:24 PM PGY-2, [facility]

## 2023-01-16 ENCOUNTER — Telehealth: Payer: Self-pay

## 2023-01-16 ENCOUNTER — Other Ambulatory Visit (HOSPITAL_COMMUNITY): Payer: Self-pay

## 2023-01-16 NOTE — Telephone Encounter (Signed)
Patient calls nurse line regarding issues with getting Ozempic.   Durward Mallard- Have you received a PA request for this? If so, can you provide the key so that I can initiate via covermymeds.   Thanks.   Veronda Prude, RN

## 2023-01-23 ENCOUNTER — Other Ambulatory Visit: Payer: Self-pay | Admitting: Student

## 2023-01-23 ENCOUNTER — Ambulatory Visit
Admission: RE | Admit: 2023-01-23 | Discharge: 2023-01-23 | Disposition: A | Discharge status: Home / Self Care | Payer: 59 | Source: Ambulatory Visit | Attending: Family Medicine | Admitting: Family Medicine

## 2023-01-23 DIAGNOSIS — Z1231 Encounter for screening mammogram for malignant neoplasm of breast: Secondary | ICD-10-CM

## 2023-01-23 DIAGNOSIS — Z13820 Encounter for screening for osteoporosis: Secondary | ICD-10-CM

## 2023-01-24 ENCOUNTER — Encounter: Payer: Self-pay | Admitting: Pharmacist

## 2023-01-24 ENCOUNTER — Ambulatory Visit (INDEPENDENT_AMBULATORY_CARE_PROVIDER_SITE_OTHER): Payer: 59 | Admitting: Pharmacist

## 2023-01-24 VITALS — BP 143/77 | HR 101 | Ht 63.0 in | Wt 266.4 lb

## 2023-01-24 DIAGNOSIS — E119 Type 2 diabetes mellitus without complications: Secondary | ICD-10-CM | POA: Diagnosis not present

## 2023-01-24 DIAGNOSIS — E785 Hyperlipidemia, unspecified: Secondary | ICD-10-CM

## 2023-01-24 DIAGNOSIS — I10 Essential (primary) hypertension: Secondary | ICD-10-CM

## 2023-01-24 NOTE — Assessment & Plan Note (Signed)
" >>  ASSESSMENT AND PLAN FOR HYPERTENSION, BENIGN SYSTEMIC WRITTEN ON 01/24/2023  3:31 PM BY Jude Linck G, RPH-CPP  Hypertension longstanding currently controlled, although elevated BP in clinic today. Increased BP likely due to anxiety from initiating Ozempic  injection in clinic. Blood pressure goal of <130/80 mmHg. Medication adherence fair.  -Continued hydrochlorothiazide  25 mg -Continued metoprolol  tartrate 25 mg twice daily -UACR last evaluated 10/2021 - consider repeat "

## 2023-01-24 NOTE — Patient Instructions (Addendum)
It was nice to see you today!  Your goal blood sugar is 80-130 before eating and less than 180 after eating.  Medication Changes:  Start Ozempic 0.25 mg once weekly.  Stop metformin.   Monitor blood sugars at home and keep a log (glucometer or piece of paper) to bring with you to your next visit.  Keep up the good work with diet and exercise. Aim for a diet full of vegetables, fruit and lean meats (chicken, [place], fish). Try to limit salt intake by eating fresh or frozen vegetables (instead of canned), rinse canned vegetables prior to cooking and do not add any additional salt to meals.

## 2023-01-24 NOTE — Assessment & Plan Note (Signed)
Hypertension longstanding currently controlled, although elevated BP in clinic today. Increased BP likely due to anxiety from initiating Ozempic injection in clinic. Blood pressure goal of <130/80 mmHg. Medication adherence fair.  -Continued hydrochlorothiazide 25 mg -Continued metoprolol tartrate 25 mg twice daily -UACR last evaluated 10/2021 - consider repeat

## 2023-01-24 NOTE — Assessment & Plan Note (Signed)
Diabetes longstanding currently well controlled. Patient is able to verbalize appropriate hypoglycemia management plan. Medication adherence appears fair. Plan to transition off metformin to Ozempic for weight loss benefit. -Started GLP-1 Ozempic (semaglutide) 0.25 mg once weekly (pt was counseled and completed first injection in clinic today) -Discontinued metformin -Patient educated on purpose, proper use, and potential adverse effects.  -Extensively discussed pathophysiology of diabetes, recommended lifestyle interventions, dietary effects on blood sugar control.  -Counseled on s/sx of and management of hypoglycemia.  -Next A1c anticipated ~5-6 months.

## 2023-01-24 NOTE — Assessment & Plan Note (Signed)
ASCVD risk - primary prevention in patient with diabetes. Last LDL is 61 at goal of <70 mg/dL. -Continued simvastatin 20 mg.

## 2023-01-24 NOTE — Progress Notes (Signed)
S:     Chief Complaint  Patient presents with   Medication Management    Ozempic (semaglutide)   67 y.o. female who presents for diabetes evaluation, education, and management. Patient arrives in good spirits and presents with assistance. From a walker.   Patient was referred and last seen by Primary Care Provider, Dr. Jena Gauss on 01/09/23.   PMH is significant for HTN, GERD, Depression, T2DM.  At last visit, diabetes was well controlled. To assist with weight loss, Ozempic was prescribed with plan to transition off metformin.   Patient reports Diabetes was diagnosed in 2017.   Current diabetes medications include: metformin 500 mg BID Current hypertension medications include: hydrochlorothiazide 25 mg daily, metoprolol tartrate 25 mg BID Current hyperlipidemia medications include: simvastatin 20 mg daily  Patient reports adherence to taking all medications as prescribed.   Do you feel that your medications are working for you? yes Have you been experiencing any side effects to the medications prescribed? Yes - pt noted increased constipation Do you have any problems obtaining medications due to transportation or finances? no Insurance coverage:  [facility] Medicare  Patient denies hypoglycemic events.  Reported home fasting blood sugars: not checking   O:   Review of Systems  Gastrointestinal:  Positive for constipation.    Physical Exam Constitutional:      Appearance: Normal appearance.  Pulmonary:     Effort: Pulmonary effort is normal.  Neurological:     Mental Status: She is alert.  Psychiatric:        Mood and Affect: Mood normal.        Behavior: Behavior normal.        Thought Content: Thought content normal.        Judgment: Judgment normal.     Lab Results  Component Value Date   HGBA1C 6.1 01/09/2023   Vitals:   01/24/23 1354 01/24/23 1355  BP: (!) 140/75 (!) 143/77  Pulse: (!) 101   SpO2: 100%     Lipid Panel     Component Value Date/Time    CHOL 131 07/19/2022 1450   TRIG 115 07/19/2022 1450   HDL 49 07/19/2022 1450   CHOLHDL 2.7 07/19/2022 1450   CHOLHDL 3.3 01/13/2016 1540   VLDL 24 01/13/2016 1540   LDLCALC 61 07/19/2022 1450    Clinical Atherosclerotic Cardiovascular Disease (ASCVD): No  The 10-year ASCVD risk score (Arnett DK, et al., 2019) is: 18.8%   Values used to calculate the score:     Age: 70 years     Sex: Female     Is Non-Hispanic African American: Yes     Diabetic: Yes     Tobacco smoker: No     Systolic Blood Pressure: 143 mmHg     Is BP treated: Yes     HDL Cholesterol: 49 mg/dL     Total Cholesterol: 131 mg/dL   Patient is participating in a Managed Medicaid Plan:  No   A/P: Diabetes longstanding currently well controlled. Patient is able to verbalize appropriate hypoglycemia management plan. Medication adherence appears fair. Plan to transition off metformin to Ozempic for weight loss benefit. -Started GLP-1 Ozempic (semaglutide) 0.25 mg once weekly (pt was counseled and completed first injection in clinic today) -Discontinued metformin -Patient educated on purpose, proper use, and potential adverse effects.  -Extensively discussed pathophysiology of diabetes, recommended lifestyle interventions, dietary effects on blood sugar control.  -Counseled on s/sx of and management of hypoglycemia.  -Next A1c anticipated ~5-6 months.  ASCVD risk - primary prevention in patient with diabetes. Last LDL is 61 at goal of <70 mg/dL. -Continued simvastatin 20 mg.   Hypertension longstanding currently controlled, although elevated BP in clinic today. Increased BP likely due to anxiety from initiating Ozempic injection in clinic. Blood pressure goal of <130/80 mmHg. Medication adherence fair.  -Continued hydrochlorothiazide 25 mg -Continued metoprolol tartrate 25 mg twice daily -UACR last evaluated 10/2021 - consider repeat  Depression, stable. However, amitriptyline likely contributing to constipation and is  not preferred in 67 yo patient due to anticholinergic side effects.  -Consider potential transition from amitriptyline to duloxetine with psychiatrist  Written patient instructions provided. Patient verbalized understanding of treatment plan.  Total time in face to face counseling 34 minutes.    Follow-up:  Pharmacist PRN. PCP clinic visit in 3 weeks.  Patient seen with  Adam Phenix, PharmD, PGY-1 Pharmacy Resident.

## 2023-01-25 NOTE — Progress Notes (Signed)
Reviewed and agree with Dr Koval's plan.   

## 2023-01-31 ENCOUNTER — Ambulatory Visit (INDEPENDENT_AMBULATORY_CARE_PROVIDER_SITE_OTHER): Payer: 59 | Admitting: Family Medicine

## 2023-01-31 DIAGNOSIS — E119 Type 2 diabetes mellitus without complications: Secondary | ICD-10-CM | POA: Diagnosis not present

## 2023-01-31 NOTE — Progress Notes (Signed)
Medical Nutrition Therapy Appt start time: 1600 end time: 1700 (1 hour) Primary concerns today: Weight management and Blood sugar control.   Relevant history/background: [name] was referred by PCP Alfredo Martinez, MD for MNT related to type 2 diabetes.  Other diagnoses include HTN, dyslipidemia, obesity, and depression.  She saw Paulino Rily, PharmD on 01/24/23 to initiate Ozempic.  A1c on 01/09/23 was 6/1%.  She has had 2 full hip replacements on her L hip, and continues to experience pain for which she takes acetaminophen every 8 hrs, and sometimes needs additional Advil for pain.  [name] is especially drawn to sweets, and would like help in managing intake of sweets.    Assessment:  Kristin [name] lives with her husband, and grandchildren visit frequently.  Her husband does most food preparation, as standing for long periods is difficult for her.    Learning Readiness: Ready   Weight: on 01/24/23 wt was 266.4 lb; ht is 63".   Usual eating pattern: 1-2 meals and 3-4 snacks per day. FBG: Not checking BG at home.   Frequent foods and beverages: water, ~16 oz regular Kool-Aid/day, 8 oz apple juice/day; fried or other chx, tuna, fried fish, roast beef, salads with [place] dressing and eggs.   Avoided foods: bacon (concerns re. BP), oat meal, yogurt.   Usual physical activity: None currently. Sleep: Estimates ~6 hours/night.  Pain wakes her at night 1-2 X night.  Naps 30-45 min 2 X wk, usually before 3 PM.   Food security -  Within the past 12 months:    - Did you run out or worry that you would run out of food, and not have money to get more?   No.    24-hr recall: (Up at 6 AM) B (8 AM)-   8 oz apple juice with med's, water Snk (11 AM)-   water L (1 PM)-  1 1/2 c dry Apple Jacks cereal, water Snk ( PM)-  --- D (5 PM)-  1 breaded fried chx leg, 1 c canned corn, 1/2 canned string beans, 16 oz Kool-Aid  Snk (9 PM)-  1 c Apple Jacks, 1/2 c 2% milk Typical day? Yes.     Nutritional Diagnosis:   NI-5.8.2 Excessive carbohydrate intake As related to BG management and nutritional balance.  As evidenced by daily intake of 8 oz apple juice, usual intake of 16 oz Kool-Aid and carb-heavy meals.  Handouts given during visit include: After-Visit Summary (AVS) Goals Sheet (mailed b/c forgot to print out while patient was here).    Demonstrated degree of understanding via:  Teach Back  Barriers to learning/adherence to lifestyle change: Longstanding eating practices.    For behavioral goals and recommendations, see Patient Instructions.    Monitoring/Evaluation:  Dietary intake, exercise, and body weight in 4 week(s).

## 2023-01-31 NOTE — Patient Instructions (Addendum)
   Protein drinks:  I recommend that you dilute most by half with either plain milk or soy milk, either of which will already provide a good amount of protein, allowing you to use less of the more expensive protein product.   Diet Recommendations for Diabetes  Carbohydrate includes starch, sugar, and fiber.  Of these, only sugar and starch raise blood glucose.  (Fiber is found in fruits, vegetables [especially skin, seeds, and stalks], whole grains, and beans.)   Starchy (carb) foods: Bread, rice, pasta, potatoes, corn, cereal, grits, crackers, bagels, muffins, all baked goods.  (Fruit, milk, and yogurt also have carbohydrate, but most of these foods will not spike your blood sugar as most starchy or sweet foods will.)  A few fruits do cause high blood sugars; use small portions of bananas (limit to 1/2 at a time), grapes, watermelon, and oranges.  (A summer treat is frozen grapes, which helps you eat them more slowly.) Protein foods: Meat, fish, poultry, eggs, dairy foods, and beans such as pinto and kidney beans (beans also provide carbohydrate).  Beans have, on average, 10-15 grams of fiber per cup, none of which adds to your blood sugar level.  Dietary fiber recommended is 28 grams on a diet of about 2000 calories.    Behavioral Goals: 1. Eat 3 REAL meals per day at least 5 days per week.  Eat breakfast within one hour of getting up.  Have something to eat at least every 5 hours while awake.  - A REAL breakfast needs to include both starch and protein foods.  This could be a small protein shake with some fruit or other form of carbohydrate.  Or it could be 1-2 eggs with toast.   - A REAL meal for lunch or dinner includes at least some protein, some starch, and vegetables.  (You can plan on having fruit for snacks, if not part of your meal).     2. Limit starchy foods to TWO portions per meal and ONE per snack. ONE portion of a starchy food is equal to the following:  - ONE slice of bread (or its  equivalent, such as half of a hamburger bun).  - 1/2 cup of a "scoopable" starchy food such as potatoes or rice.  - 15 grams of Total Carbohydrate as shown on food label.  - 4 ounces of a sweet drink (including fruit juice).  (You may want to try diluting fruit juice with club soda or seltzer.)    3. Include vegetables in at least 7 meals per week.  - Fresh or frozen vegetables are best.  - Keep frozen vegetables on hand for a quick option.    - More fruits and vegetables will be beneficial for your blood sugar, cholesterol, body weight, and blood pressure, as well helping your bowel function to stay regular.  The DASH diet (Dietary Approach to Stop Hypertension) recommends a daily intake of 4-5 servings of veg's and 4-5 servings of fruit.    Document progress on your goals using the Goals Sheet provided today.  Bring your Goals Sheet to your follow-up appt on Thursday, Aug 29 at 3:30 PM.

## 2023-02-01 ENCOUNTER — Ambulatory Visit: Payer: 59

## 2023-02-02 ENCOUNTER — Other Ambulatory Visit: Payer: Self-pay | Admitting: Student

## 2023-02-13 ENCOUNTER — Ambulatory Visit (INDEPENDENT_AMBULATORY_CARE_PROVIDER_SITE_OTHER): Payer: 59 | Admitting: Student

## 2023-02-13 ENCOUNTER — Encounter: Payer: Self-pay | Admitting: Student

## 2023-02-13 VITALS — BP 139/72 | HR 106 | Ht 63.0 in | Wt 263.0 lb

## 2023-02-13 DIAGNOSIS — R000 Tachycardia, unspecified: Secondary | ICD-10-CM | POA: Diagnosis not present

## 2023-02-13 DIAGNOSIS — E119 Type 2 diabetes mellitus without complications: Secondary | ICD-10-CM

## 2023-02-13 DIAGNOSIS — R350 Frequency of micturition: Secondary | ICD-10-CM | POA: Diagnosis not present

## 2023-02-13 LAB — POCT URINALYSIS DIP (MANUAL ENTRY)
Bilirubin, UA: NEGATIVE
Blood, UA: NEGATIVE
Glucose, UA: NEGATIVE mg/dL
Ketones, POC UA: NEGATIVE mg/dL
Leukocytes, UA: NEGATIVE
Nitrite, UA: NEGATIVE
Protein Ur, POC: NEGATIVE mg/dL
Spec Grav, UA: 1.02 (ref 1.010–1.025)
Urobilinogen, UA: 0.2 E.U./dL
pH, UA: 6 (ref 5.0–8.0)

## 2023-02-13 LAB — POCT UA - MICROSCOPIC ONLY
Epithelial cells, urine per micros: >20
WBC, Ur, HPF, POC: NONE SEEN (ref 0–5)

## 2023-02-13 MED ORDER — SEMAGLUTIDE(0.25 OR 0.5MG/DOS) 2 MG/1.5ML ~~LOC~~ SOPN
0.5000 mg | PEN_INJECTOR | SUBCUTANEOUS | 2 refills | Status: AC
Start: 2023-02-13 — End: ?

## 2023-02-13 NOTE — Patient Instructions (Addendum)
It was great to see you today! Thank you for choosing [facility] for your primary care.  Today we addressed: For your diabetes--Please use the Ozempic 0.5 mg weekly and call with any side effects   2. For the urinary symptoms--I will check your urine and will call with results    If you haven't already, sign up for My Chart to have easy access to your labs results, and communication with your primary care physician. We are checking some labs today. If they are abnormal, I will call you. If they are normal, I will send you a MyChart message (if it is active) or a letter in the mail. If you do not hear about your labs in the next 2 weeks, please call the office. I recommend that you always bring your medications to each appointment as this makes it easy to ensure you are on the correct medications and helps [place] not miss refills when you need them. Call the clinic at [phone] if your symptoms worsen or you have any concerns. Return in about 2 months (around 04/15/2023) for DM2. Please arrive 15 minutes before your appointment to ensure smooth check in process.  We appreciate your efforts in making this happen.  Thank you for allowing me to participate in your care, Kristin Martinez, MD 02/13/2023, 3:40 PM PGY-3, [facility]

## 2023-02-13 NOTE — Progress Notes (Signed)
  SUBJECTIVE:   CHIEF COMPLAINT / HPI:   Type 2 Diabetes: Home medications include: Ozempic 0.25 mg. Recently saw pharmacy and nutrition for assistance with DM. Reports that she feels no benefit from the current Ozempic dosage.   Most recent A1Cs:  Lab Results  Component Value Date   HGBA1C 6.1 01/09/2023   HGBA1C 6.0 10/14/2022   Last Microalbumin, LDL, Creatinine: Lab Results  Component Value Date   MICROALBUR 30 10/30/2019   LDLCALC 61 07/19/2022   CREATININE 0.96 07/19/2022    Patient is up to date on diabetic eye.  S/P left total hip  Recurrent Prosthetic Hip Infection: Follows with [facility] who recently recommended right show to improve gait with leg length discrepancy. She was on antibiotics postoperatively specifically doxycycline for a total of 6 months.   Hypertension: BP: 139/72 today. Home medications include: Metoprolol 25 mg, hydrochlorothiazide 25 mg. She endorses taking these medications as prescribed.   Most recent creatinine trend:  Lab Results  Component Value Date   CREATININE 0.96 07/19/2022   CREATININE 1.04 (H) 10/18/2021   CREATININE 1.10 (H) 12/26/2019   Patient has had a BMP in the past 1 year.  Urinary Concern:  Kristin [name] is a 67 y.o. female who complains of urinary frequency x a few days, without flank pain, fever, chills, or abnormal vaginal discharge or bleeding.  Reports of odor and change in color of urine.   PERTINENT  PMH / PSH: renal sclerosis?, HTN, GERD, DM2    Patient Care Team: Alfredo Martinez, MD as PCP - General (Family Medicine) OBJECTIVE:  BP 139/72   Pulse (!) 106   Ht 5\' 3"  (1.6 m)   Wt 263 lb (119.3 kg)   SpO2 100%   BMI 46.59 kg/m  Physical Exam  General: Alert and oriented in no apparent distress Heart: Regular rate and rhythm with no murmurs appreciated Lungs: CTA bilaterally, no wheezing Abdomen: Bowel sounds present, no abdominal pain, no flank pain  Skin: Warm and dry Extremities: No lower  extremity edema  ASSESSMENT/PLAN:  Increased urinary frequency Assessment & Plan: Push fluids, may use Pyridium OTC prn. UA dipstick and micro obtained, will update with results   Orders: -     POCT urinalysis dipstick -     POCT UA - Microscopic Only  Type 2 diabetes mellitus without complication, without long-term current use of insulin (HCC) Assessment & Plan: Increase Ozempic  Obtained UACr Patient declined foot exam for now  Foot exam at follow up in 2 months   Orders: -     Microalbumin / creatinine urine ratio -     Semaglutide(0.25 or 0.5MG /DOS); Inject 0.5 mg into the skin once a week.  Dispense: 3 mL; Refill: 2  Sinus tachycardia Assessment & Plan: Asymptomatic, on bb.     Return in about 2 months (around 04/15/2023) for DM2. Alfredo Martinez, MD 02/13/2023, 4:30 PM PGY-3, [facility]

## 2023-02-13 NOTE — Assessment & Plan Note (Signed)
Asymptomatic, on bb.

## 2023-02-13 NOTE — Assessment & Plan Note (Signed)
Push fluids, may use Pyridium OTC prn. UA dipstick and micro obtained, will update with results

## 2023-02-13 NOTE — Assessment & Plan Note (Signed)
Increase Ozempic  Obtained UACr Patient declined foot exam for now  Foot exam at follow up in 2 months

## 2023-02-14 ENCOUNTER — Ambulatory Visit
Admission: RE | Admit: 2023-02-14 | Discharge: 2023-02-14 | Disposition: A | Discharge status: Home / Self Care | Payer: 59 | Source: Ambulatory Visit | Attending: Family Medicine | Admitting: Family Medicine

## 2023-02-14 DIAGNOSIS — Z1231 Encounter for screening mammogram for malignant neoplasm of breast: Secondary | ICD-10-CM

## 2023-02-16 ENCOUNTER — Other Ambulatory Visit: Payer: Self-pay | Admitting: Student

## 2023-02-20 ENCOUNTER — Encounter: Payer: Self-pay | Admitting: Student

## 2023-02-22 ENCOUNTER — Encounter: Payer: Self-pay | Admitting: Orthopaedic Surgery

## 2023-02-22 ENCOUNTER — Ambulatory Visit (INDEPENDENT_AMBULATORY_CARE_PROVIDER_SITE_OTHER): Payer: 59 | Admitting: Orthopaedic Surgery

## 2023-02-22 ENCOUNTER — Other Ambulatory Visit: Payer: Self-pay

## 2023-02-22 ENCOUNTER — Telehealth: Payer: Self-pay

## 2023-02-22 VITALS — Ht 63.0 in | Wt 264.0 lb

## 2023-02-22 DIAGNOSIS — Z6841 Body Mass Index (BMI) 40.0 and over, adult: Secondary | ICD-10-CM | POA: Diagnosis not present

## 2023-02-22 DIAGNOSIS — M25551 Pain in right hip: Secondary | ICD-10-CM | POA: Diagnosis not present

## 2023-02-22 DIAGNOSIS — M1611 Unilateral primary osteoarthritis, right hip: Secondary | ICD-10-CM

## 2023-02-22 NOTE — Telephone Encounter (Signed)
Patient LVM on nurse line regarding UA and issues with picking up Ozempic.   Called pharmacy. Pharmacist reports that medication is ready for pick up and runs through her insurance at $0 cost.   Called patient. Provided with update on Ozempic and relayed message from Dr. Jena Gauss regarding UA.   Patient appreciative. No further questions or concerns at this time.   Veronda Prude, RN

## 2023-02-22 NOTE — Progress Notes (Signed)
Office Visit Note   Patient: Kristin [name]           Date of Birth: 12-09-1955           MRN: [id number] Visit Date: 02/22/2023              Requested by: Alfredo Martinez, MD [address] [place],  [place] [address] PCP: Alfredo Martinez, MD   Assessment & Plan: Visit Diagnoses:  1. Primary osteoarthritis of right hip   2. Pain in right hip   3. Body mass index 45.0-49.9, adult [facility])     Plan: Zeba is a 67 year old female with end-stage right hip DJD.  Unfortunately cortisone injection only lasted 2 days which was done back in May.  At this point her options are limited based on her increased BMI of 46.  Her goal weight is 225 pounds.  The patient meets the AMA guidelines for Morbid (severe) obesity with a BMI > 40.0 and I have recommended weight loss.   Follow-Up Instructions: No follow-ups on file.   Orders:  Orders Placed This Encounter  Procedures   XR HIP UNILAT W OR W/O PELVIS 2-3 VIEWS RIGHT   No orders of the defined types were placed in this encounter.     Procedures: No procedures performed   Clinical Data: No additional findings.   Subjective: Chief Complaint  Patient presents with   Right Hip - Pain    HPI Kristin [name] comes in today for continued right hip pain.  Underwent steroid injection in May but only lasted 2 days. Review of Systems   Objective: Vital Signs: Ht 5\' 3"  (1.6 m)   Wt 264 lb (119.7 kg)   BMI 46.77 kg/m   Physical Exam  Ortho Exam Examination shows antalgic gait.  Ambulates with a rolling walker.  Pain with hip rotation. Specialty Comments:  No specialty comments available.  Imaging: XR HIP UNILAT W OR W/O PELVIS 2-3 VIEWS RIGHT  Result Date: 02/22/2023 Advanced degenerative joint disease with bone-on-bone joint space narrowing of the right hip joint.  Stable revision left total hip replacement.    PMFS History: Patient Active Problem List   Diagnosis Date Noted   Increased urinary frequency 02/13/2023   Recent  skin changes 10/15/2022   Urinary retention 12/13/2021   Postoperative anemia due to acute blood loss 12/10/2021   Presence of left artificial hip joint 12/10/2021   Type 2 diabetes mellitus with obesity (HCC) 12/09/2021   Infection and inflammatory reaction due to other internal joint prosthesis, initial encounter (HCC) 12/09/2021   Preop examination 12/09/2021   HTN (hypertension) 12/08/2021   Arthritis 11/26/2021   Seizures (HCC) 11/26/2021   Rash 09/18/2020   Pruritus 10/30/2019   Hypercalcemia 10/24/2019   Body mass index 45.0-49.9, adult (HCC) 08/27/2019   Intertrigo 07/09/2019   Facial rash 07/27/2018   Body mass index 40.0-44.9, adult (HCC) 02/15/2018   Fatigue associated with anemia 02/07/2018   Unspecified open wound, left hip, initial encounter 01/19/2018   Wound of left leg, sequela 10/03/2016   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HCC) 08/24/2016   Status post left hip replacement 06/01/2016   Sinus tachycardia 05/19/2016   History of adenomatous polyp of colon 03/21/2016   Diabetes (HCC) 01/21/2016   Thyroid nodule 12/25/2014   Infiltrate noted on imaging study    Cervical spine arthritis 11/14/2014   Primary osteoarthritis of left hip 11/14/2014   Loss of consciousness (HCC) 11/14/2014   Chest pain 03/26/2014   Musculoskeletal chest  and rib pain 03/26/2014   Rib pain on right side 02/28/2014   Groin pain 04/18/2013   UNSPECIFIED RENAL SCLEROSIS 01/16/2009   GERD, SEVERE 05/25/2007   Morbid obesity (HCC) 04/03/2007   Dyslipidemia 08/31/2006   Major depression, recurrent (HCC) 08/31/2006   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   Former smoker 08/31/2006   HYPERTENSION, BENIGN SYSTEMIC 08/31/2006   CONVULSIONS, SEIZURES, NOS 08/31/2006   Past Medical History:  Diagnosis Date   Allergy    seasonal   Anemia    Anxiety    Arthritis    Back pain, chronic    Borderline diabetes    Bronchitis    Constipation    current issue   Cough    Depression     Epilepsy (HCC)    GERD (gastroesophageal reflux disease)    Hyperlipidemia    Hypertension    IBS (irritable bowel syndrome)    Joint pain    Obesity    Palpitations    Pneumonia 2016   Pre-diabetes    Renal sclerosis, unspecified    only has 1 [name]   Rheumatoid arthritis (HCC)    Seizures (HCC)    last >15 +  yrs ago- recorded 07-26-2021   Somatization disorder    Tobacco abuse     Family History  Problem Relation Age of Onset   Other Mother        failed surgery   Heart Problems Father        poss MI, not sure:per pt   Colon cancer Father    Colon cancer Maternal Uncle        not sure age of onset   Alzheimer's disease Maternal Grandmother    Heart disease Maternal Grandfather    Colon cancer Other        grandfather   Colon polyps Neg Hx    Esophageal cancer Neg Hx    Stomach cancer Neg Hx    Rectal cancer Neg Hx     Past Surgical History:  Procedure Laterality Date   ABDOMINAL HYSTERECTOMY     ANTERIOR HIP REVISION Left 07/25/2016   Procedure: LEFT HIP IRRIGATION AND DEBRIDEMENT, REVISION OF HEAD AND LINER;  Surgeon: Tarry Kos, MD;  Location: [facility] OR;  Service: Orthopedics;  Laterality: Left;   APPLICATION OF WOUND VAC Left 12/23/2019   Procedure: APPLICATION OF WOUND VAC;  Surgeon: Tarry Kos, MD;  Location: [facility] OR;  Service: Orthopedics;  Laterality: Left;   BREAST EXCISIONAL BIOPSY Right    CHOLECYSTECTOMY     COLONOSCOPY     DEBRIDEMENT AND CLOSURE WOUND Left 07/17/2019   Procedure: Excision of left leg wound with ACell placement and primary closure;  Surgeon: Peggye Form, DO;  Location: [facility];  Service: Plastics;  Laterality: Left;  60 min   EXCISIONAL TOTAL HIP ARTHROPLASTY WITH ANTIBIOTIC SPACERS Left 12/23/2019   Procedure: LEFT HIP IRRIGATION AND DEBRIDEMENT LEFT HIP WOUND and poly exchange ;  Surgeon: Tarry Kos, MD;  Location: [facility] OR;  Service: Orthopedics;  Laterality: Left;   HIP DEBRIDEMENT Left 01/19/2018    Procedure(s) Performed: IRRIGATION AND DEBRIDEMENT LEFT HIP (Left )   INCISION AND DRAINAGE HIP Left 08/03/2016   Procedure: IRRIGATION AND DEBRIDEMENT LEFT HIP, VAC PLACEMENT;  Surgeon: Tarry Kos, MD;  Location: [facility] OR;  Service: Orthopedics;  Laterality: Left;   INCISION AND DRAINAGE HIP Left 01/19/2018   Procedure: IRRIGATION AND DEBRIDEMENT LEFT HIP;  Surgeon: Tarry Kos, MD;  Location: [facility] OR;  Service: Orthopedics;  Laterality: Left;   [name] stent Right 2002   TONSILLECTOMY Bilateral 12/06/2014   Procedure: TONSILLECTOMY, BILATERAL;  Surgeon: Melvenia Beam, MD;  Location: [facility] OR;  Service: ENT;  Laterality: Bilateral;   TONSILLECTOMY     TOTAL HIP ARTHROPLASTY Left 06/01/2016   Procedure: LEFT TOTAL HIP ARTHROPLASTY ANTERIOR APPROACH;  Surgeon: Tarry Kos, MD;  Location: [facility] OR;  Service: Orthopedics;  Laterality: Left;   TUBAL LIGATION     WOUND EXPLORATION Left 01/19/2018   Procedure: WOUND EXPLORATION;  Surgeon: Tarry Kos, MD;  Location: [facility] OR;  Service: Orthopedics;  Laterality: Left;   Social History   Occupational History   Occupation: Not Working  Tobacco Use   Smoking status: Former    Current packs/day: 0.00    Average packs/day: 0.3 packs/day for 0.5 years (0.1 ttl pk-yrs)    Types: Cigarettes    Start date: 07/29/2013    Quit date: 01/27/2014    Years since quitting: 9.0   Smokeless tobacco: Never  Vaping Use   Vaping status: Never Used  Substance and Sexual Activity   Alcohol use: No   Drug use: No   Sexual activity: Not on file

## 2023-03-02 ENCOUNTER — Ambulatory Visit: Payer: 59 | Admitting: Family Medicine

## 2023-03-14 ENCOUNTER — Other Ambulatory Visit: Payer: Self-pay

## 2023-03-17 MED ORDER — AMITRIPTYLINE HCL 75 MG PO TABS: 150.0000 mg | ORAL_TABLET | Freq: Every day | ORAL | 1 refills | Status: DC

## 2023-04-04 ENCOUNTER — Ambulatory Visit: Payer: 59 | Admitting: Student

## 2023-04-04 ENCOUNTER — Encounter: Payer: Self-pay | Admitting: Student

## 2023-04-04 VITALS — BP 137/65 | HR 93 | Ht 63.0 in | Wt 254.6 lb

## 2023-04-04 DIAGNOSIS — E119 Type 2 diabetes mellitus without complications: Secondary | ICD-10-CM

## 2023-04-04 DIAGNOSIS — Z7985 Long-term (current) use of injectable non-insulin antidiabetic drugs: Secondary | ICD-10-CM

## 2023-04-04 DIAGNOSIS — M25452 Effusion, left hip: Secondary | ICD-10-CM | POA: Diagnosis not present

## 2023-04-04 MED ORDER — SEMAGLUTIDE (1 MG/DOSE) 4 MG/3ML ~~LOC~~ SOPN: 1.0000 mg | PEN_INJECTOR | SUBCUTANEOUS | 2 refills | Status: DC

## 2023-04-04 NOTE — Progress Notes (Signed)
    SUBJECTIVE:   CHIEF COMPLAINT / HPI:   Ozempic Follow Up  DM2:  -DM2 on Ozempic 0.5 mg  -Needs foot exam  -Patient reports that she has had some improvement with hunger since increasing Ozempic dosage -She has no vomiting or diarrhea -No abdominal pain   S/P left total hip  Recurrent Prosthetic Hip Infection:  Patient uses a walker for ambulation.  Does note that she has what appears to be fluid underneath the skin of the left hip, has follow up with clinic that performed surgery on the left hip in November.   PERTINENT  PMH / PSH:  HTN GERD Elevated BMI  DM2 Sinus tachycardia   OBJECTIVE:   BP 137/65   Pulse 93   Ht 5\' 3"  (1.6 m)   Wt 254 lb 9.6 oz (115.5 kg)   SpO2 100%   BMI 45.10 kg/m   General: Alert and oriented in no apparent distress, utilizes walker  Heart: Regular rate and rhythm with no murmurs appreciated Lungs: Normal WOB Abdomen: no abdominal pain Skin: Warm and dry Extremities  Left Hip: Small swelling under skin of left hip, no erythema or heat, no drainage  Diabetic Foot Exam - Simple   Simple Foot Form Visual Inspection No deformities, no ulcerations, no other skin breakdown bilaterally: Yes Sensation Testing Intact to touch and monofilament testing bilaterally: Yes Pulse Check Posterior Tibialis and Dorsalis pulse intact bilaterally: Yes Comments     ASSESSMENT/PLAN:   Assessment & Plan Type 2 diabetes mellitus without complication, without long-term current use of insulin (HCC) Foot exam today. Increase Ozempic dosage today, has already lost 10 lbs since 02/2023. Otherwise UTD. Follow up in 2 months, can call for increasing dosage of Ozempic in 1 month  Swelling of left hip joint Superficial swelling, possibly seroma but non-infectious. Follow up with surgeon in November.     Alfredo Martinez, MD [facility]

## 2023-04-04 NOTE — Patient Instructions (Addendum)
It was great to see you today! Thank you for choosing [facility] for your primary care.  Today we addressed: I will increase your Ozempic dosage  Let me know if you have any side effects   If you haven't already, sign up for My Chart to have easy access to your labs results, and communication with your primary care physician. I recommend that you always bring your medications to each appointment as this makes it easy to ensure you are on the correct medications and helps [place] not miss refills when you need them. Call the clinic at [phone] if your symptoms worsen or you have any concerns. Return in about 3 months (around 07/05/2023) for DM2. Please arrive 15 minutes before your appointment to ensure smooth check in process.  We appreciate your efforts in making this happen.  Thank you for allowing me to participate in your care, Alfredo Martinez, MD 04/04/2023, 10:53 AM PGY-3, [facility]

## 2023-04-04 NOTE — Assessment & Plan Note (Signed)
Foot exam today. Increase Ozempic dosage today, has already lost 10 lbs since 02/2023. Otherwise UTD. Follow up in 2 months, can call for increasing dosage of Ozempic in 1 month

## 2023-04-11 ENCOUNTER — Other Ambulatory Visit: Payer: Self-pay

## 2023-04-13 MED ORDER — SIMVASTATIN 20 MG PO TABS: 20.0000 mg | ORAL_TABLET | Freq: Every day | ORAL | 2 refills | Status: DC

## 2023-04-13 MED ORDER — HYDROCHLOROTHIAZIDE 25 MG PO TABS: 25.0000 mg | ORAL_TABLET | Freq: Every day | ORAL | 2 refills | Status: DC

## 2023-04-18 ENCOUNTER — Ambulatory Visit: Payer: 59 | Admitting: *Deleted

## 2023-04-18 DIAGNOSIS — Z0000 Encounter for general adult medical examination without abnormal findings: Secondary | ICD-10-CM

## 2023-04-18 NOTE — Patient Instructions (Signed)
Kristin [name] , Thank you for taking time to come for your Medicare Wellness Visit. I appreciate your ongoing commitment to your health goals. Please review the following plan we discussed and let me know if I can assist you in the future.   Screening recommendations/referrals: Colonoscopy: up to date Mammogram: up to date Bone Density: up to date Recommended yearly ophthalmology/optometry visit for glaucoma screening and checkup Recommended yearly dental visit for hygiene and checkup  Vaccinations: Influenza vaccine: Education provided Pneumococcal vaccine: up to date Tdap vaccine: up to date Shingles vaccine: Education provided    Advanced directives: Education provided    Preventive Care 65 Years and Older, Female Preventive care refers to lifestyle choices and visits with your health care provider that can promote health and wellness. What does preventive care include? A yearly physical exam. This is also called an annual well check. Dental exams once or twice a year. Routine eye exams. Ask your health care provider how often you should have your eyes checked. Personal lifestyle choices, including: Daily care of your teeth and gums. Regular physical activity. Eating a healthy diet. Avoiding tobacco and drug use. Limiting alcohol use. Practicing safe sex. Taking low-dose aspirin every day. Taking vitamin and mineral supplements as recommended by your health care provider. What happens during an annual well check? The services and screenings done by your health care provider during your annual well check will depend on your age, overall health, lifestyle risk factors, and family history of disease. Counseling  Your health care provider may ask you questions about your: Alcohol use. Tobacco use. Drug use. Emotional well-being. Home and relationship well-being. Sexual activity. Eating habits. History of falls. Memory and ability to understand (cognition). Work and work  Astronomer. Reproductive health. Screening  You may have the following tests or measurements: Height, weight, and BMI. Blood pressure. Lipid and cholesterol levels. These may be checked every 5 years, or more frequently if you are over 51 years old. Skin check. Lung cancer screening. You may have this screening every year starting at age 18 if you have a 30-pack-year history of smoking and currently smoke or have quit within the past 15 years. Fecal occult blood test (FOBT) of the stool. You may have this test every year starting at age 81. Flexible sigmoidoscopy or colonoscopy. You may have a sigmoidoscopy every 5 years or a colonoscopy every 10 years starting at age 61. Hepatitis C blood test. Hepatitis B blood test. Sexually transmitted disease (STD) testing. Diabetes screening. This is done by checking your blood sugar (glucose) after you have not eaten for a while (fasting). You may have this done every 1-3 years. Bone density scan. This is done to screen for osteoporosis. You may have this done starting at age 11. Mammogram. This may be done every 1-2 years. Talk to your health care provider about how often you should have regular mammograms. Talk with your health care provider about your test results, treatment options, and if necessary, the need for more tests. Vaccines  Your health care provider may recommend certain vaccines, such as: Influenza vaccine. This is recommended every year. Tetanus, diphtheria, and acellular pertussis (Tdap, Td) vaccine. You may need a Td booster every 10 years. Zoster vaccine. You may need this after age 10. Pneumococcal 13-valent conjugate (PCV13) vaccine. One dose is recommended after age 34. Pneumococcal polysaccharide (PPSV23) vaccine. One dose is recommended after age 64. Talk to your health care provider about which screenings and vaccines you need and how often you  need them. This information is not intended to replace advice given to you by  your health care provider. Make sure you discuss any questions you have with your health care provider. Document Released: 07/17/2015 Document Revised: 03/09/2016 Document Reviewed: 04/21/2015 Elsevier Interactive Patient Education  2017 [employer].  Fall Prevention in the Home Falls can cause injuries. They can happen to people of all ages. There are many things you can do to make your home safe and to help prevent falls. What can I do on the outside of my home? Regularly fix the edges of walkways and driveways and fix any cracks. Remove anything that might make you trip as you walk through a door, such as a raised step or threshold. Trim any bushes or trees on the path to your home. Use bright outdoor lighting. Clear any walking paths of anything that might make someone trip, such as rocks or tools. Regularly check to see if handrails are loose or broken. Make sure that both sides of any steps have handrails. Any raised decks and porches should have guardrails on the edges. Have any leaves, snow, or ice cleared regularly. Use sand or salt on walking paths during winter. Clean up any spills in your garage right away. This includes oil or grease spills. What can I do in the bathroom? Use night lights. Install grab bars by the toilet and in the tub and shower. Do not use towel bars as grab bars. Use non-skid mats or decals in the tub or shower. If you need to sit down in the shower, use a plastic, non-slip stool. Keep the floor dry. Clean up any water that spills on the floor as soon as it happens. Remove soap buildup in the tub or shower regularly. Attach bath mats securely with double-sided non-slip rug tape. Do not have throw rugs and other things on the floor that can make you trip. What can I do in the bedroom? Use night lights. Make sure that you have a light by your bed that is easy to reach. Do not use any sheets or blankets that are too big for your bed. They should not hang  down onto the floor. Have a firm chair that has side arms. You can use this for support while you get dressed. Do not have throw rugs and other things on the floor that can make you trip. What can I do in the kitchen? Clean up any spills right away. Avoid walking on wet floors. Keep items that you use a lot in easy-to-reach places. If you need to reach something above you, use a strong step stool that has a grab bar. Keep electrical cords out of the way. Do not use floor polish or wax that makes floors slippery. If you must use wax, use non-skid floor wax. Do not have throw rugs and other things on the floor that can make you trip. What can I do with my stairs? Do not leave any items on the stairs. Make sure that there are handrails on both sides of the stairs and use them. Fix handrails that are broken or loose. Make sure that handrails are as long as the stairways. Check any carpeting to make sure that it is firmly attached to the stairs. Fix any carpet that is loose or worn. Avoid having throw rugs at the top or bottom of the stairs. If you do have throw rugs, attach them to the floor with carpet tape. Make sure that you have a light switch  at the top of the stairs and the bottom of the stairs. If you do not have them, ask someone to add them for you. What else can I do to help prevent falls? Wear shoes that: Do not have high heels. Have rubber bottoms. Are comfortable and fit you well. Are closed at the toe. Do not wear sandals. If you use a stepladder: Make sure that it is fully opened. Do not climb a closed stepladder. Make sure that both sides of the stepladder are locked into place. Ask someone to hold it for you, if possible. Clearly mark and make sure that you can see: Any grab bars or handrails. First and last steps. Where the edge of each step is. Use tools that help you move around (mobility aids) if they are needed. These  include: Canes. Walkers. Scooters. Crutches. Turn on the lights when you go into a dark area. Replace any light bulbs as soon as they burn out. Set up your furniture so you have a clear path. Avoid moving your furniture around. If any of your floors are uneven, fix them. If there are any pets around you, be aware of where they are. Review your medicines with your doctor. Some medicines can make you feel dizzy. This can increase your chance of falling. Ask your doctor what other things that you can do to help prevent falls. This information is not intended to replace advice given to you by your health care provider. Make sure you discuss any questions you have with your health care provider. Document Released: 04/16/2009 Document Revised: 11/26/2015 Document Reviewed: 07/25/2014 Elsevier Interactive Patient Education  2017 [employer].

## 2023-04-18 NOTE — Progress Notes (Signed)
Subjective:   Kristin [name] is a 67 y.o. female who presents for an Initial Medicare Annual Wellness Visit.  Visit Complete: Virtual I connected with  Gerhard Perches on 04/18/23 by a audio enabled telemedicine application and verified that I am speaking with the correct person using two identifiers.  Patient Location: Home  Provider Location: Home Office  I discussed the limitations of evaluation and management by telemedicine. The patient expressed understanding and agreed to proceed.  Vital Signs: Because this visit was a virtual/telehealth visit, some criteria may be missing or patient reported. Any vitals not documented were not able to be obtained and vitals that have been documented are patient reported.  Cardiac Risk Factors include: advanced age (>18men, >12 women);diabetes mellitus;hypertension;obesity (BMI >30kg/m2)     Objective:    There were no vitals filed for this visit. There is no height or weight on file to calculate BMI.     04/18/2023    3:39 PM 04/04/2023   10:35 AM 02/13/2023    3:00 PM 10/14/2022   10:29 AM 07/19/2022    2:14 PM 10/18/2021    9:06 AM 09/16/2020    9:39 AM  Advanced Directives  Does Patient Have a Medical Advance Directive? No No No No No No No  Would patient like information on creating a medical advance directive? No - Patient declined No - Patient declined   No - Patient declined No - Patient declined No - Patient declined    Current Medications (verified) Outpatient Encounter Medications as of 04/18/2023  Medication Sig   acetaminophen (TYLENOL) 650 MG CR tablet Take 1,300 mg by mouth every 8 (eight) hours as needed for pain.   amitriptyline (ELAVIL) 75 MG tablet Take 2 tablets (150 mg total) by mouth at bedtime.   Ascorbic Acid (VITAMIN C) 250 MG CHEW Chew 250 mg by mouth daily.   aspirin EC 81 MG tablet Take 81 mg by mouth daily. Swallow whole.   cholecalciferol (VITAMIN D3) 25 MCG (1000 UNIT) tablet Take 1,000 Units by mouth  daily.   cyclobenzaprine (FLEXERIL) 10 MG tablet Take 0.5 tablets (5 mg total) by mouth at bedtime.   doxycycline (VIBRA-TABS) 100 MG tablet Take 100 mg by mouth 2 (two) times daily.   ferrous sulfate 325 (65 FE) MG EC tablet Take by mouth.   hydrochlorothiazide (HYDRODIURIL) 25 MG tablet Take 1 tablet (25 mg total) by mouth daily.   ibuprofen (ADVIL) 200 MG tablet Take 400 mg by mouth every 6 (six) hours as needed for moderate pain.   metoprolol tartrate (LOPRESSOR) 25 MG tablet TAKE ONE TABLET BY MOUTH TWICE DAILY   omeprazole (PRILOSEC) 20 MG capsule Take 20 mg by mouth at bedtime.   polyethylene glycol powder (GLYCOLAX/MIRALAX) 17 GM/SCOOP powder Take 1 Container by mouth once.   pregabalin (LYRICA) 75 MG capsule Take 75 mg by mouth 2 (two) times daily.   Semaglutide, 1 MG/DOSE, 4 MG/3ML SOPN Inject 1 mg into the skin once a week.   simvastatin (ZOCOR) 20 MG tablet Take 1 tablet (20 mg total) by mouth at bedtime.   traZODone (DESYREL) 50 MG tablet TAKE 1/2-1 TABLET AT BEDTIME as needed. Needs appointment WITH pcp Prior TO NEXT refill   zinc gluconate 50 MG tablet Take by mouth.   No facility-administered encounter medications on file as of 04/18/2023.    Allergies (verified) Fluconazole, Robitussin (alcohol free) [guaifenesin], and Robitussin dm max day-night   History: Past Medical History:  Diagnosis Date   Allergy  seasonal   Anemia    Anxiety    Arthritis    Back pain, chronic    Borderline diabetes    Bronchitis    Constipation    current issue   Cough    Depression    Epilepsy (HCC)    GERD (gastroesophageal reflux disease)    Hyperlipidemia    Hypertension    IBS (irritable bowel syndrome)    Joint pain    Obesity    Palpitations    Pneumonia 2016   Pre-diabetes    Renal sclerosis, unspecified    only has 1 kidney   Rheumatoid arthritis (HCC)    Seizures (HCC)    last >15 +  yrs ago- recorded 07-26-2021   Somatization disorder    Tobacco abuse    Past  Surgical History:  Procedure Laterality Date   ABDOMINAL HYSTERECTOMY     ANTERIOR HIP REVISION Left 07/25/2016   Procedure: LEFT HIP IRRIGATION AND DEBRIDEMENT, REVISION OF HEAD AND LINER;  Surgeon: Tarry Kos, MD;  Location: [facility] OR;  Service: Orthopedics;  Laterality: Left;   APPLICATION OF WOUND VAC Left 12/23/2019   Procedure: APPLICATION OF WOUND VAC;  Surgeon: Tarry Kos, MD;  Location: [facility] OR;  Service: Orthopedics;  Laterality: Left;   BREAST EXCISIONAL BIOPSY Right    CHOLECYSTECTOMY     COLONOSCOPY     DEBRIDEMENT AND CLOSURE WOUND Left 07/17/2019   Procedure: Excision of left leg wound with ACell placement and primary closure;  Surgeon: Peggye Form, DO;  Location: [facility];  Service: Plastics;  Laterality: Left;  60 min   EXCISIONAL TOTAL HIP ARTHROPLASTY WITH ANTIBIOTIC SPACERS Left 12/23/2019   Procedure: LEFT HIP IRRIGATION AND DEBRIDEMENT LEFT HIP WOUND and poly exchange ;  Surgeon: Tarry Kos, MD;  Location: [facility] OR;  Service: Orthopedics;  Laterality: Left;   HIP DEBRIDEMENT Left 01/19/2018   Procedure(s) Performed: IRRIGATION AND DEBRIDEMENT LEFT HIP (Left )   INCISION AND DRAINAGE HIP Left 08/03/2016   Procedure: IRRIGATION AND DEBRIDEMENT LEFT HIP, VAC PLACEMENT;  Surgeon: Tarry Kos, MD;  Location: [facility];  Service: Orthopedics;  Laterality: Left;   INCISION AND DRAINAGE HIP Left 01/19/2018   Procedure: IRRIGATION AND DEBRIDEMENT LEFT HIP;  Surgeon: Tarry Kos, MD;  Location: [facility];  Service: Orthopedics;  Laterality: Left;   kidney stent Right 2002   TONSILLECTOMY Bilateral 12/06/2014   Procedure: TONSILLECTOMY, BILATERAL;  Surgeon: Melvenia Beam, MD;  Location[facility];  Service: ENT;  Laterality: Bilateral;   TONSILLECTOMY     TOTAL HIP ARTHROPLASTY Left 06/01/2016   Procedure: LEFT TOTAL HIP ARTHROPLASTY ANTERIOR APPROACH;  Surgeon: Tarry Kos, MD;  Location: [facility];  Service: Orthopedics;  Laterality: Left;   TUBAL LIGATION     WOUND  EXPLORATION Left 01/19/2018   Procedure: WOUND EXPLORATION;  Surgeon: Tarry Kos, MD;  Location: [facility];  Service: Orthopedics;  Laterality: Left;   Family History  Problem Relation Age of Onset   Other Mother        failed surgery   Heart Problems Father        poss MI, not sure:per pt   Colon cancer Father    Colon cancer Maternal Uncle        not sure age of onset   Alzheimer's disease Maternal Grandmother    Heart disease Maternal Grandfather    Colon cancer Other        grandfather   Colon polyps  Neg Hx    Esophageal cancer Neg Hx    Stomach cancer Neg Hx    Rectal cancer Neg Hx    Social History   Socioeconomic History   Marital status: Married    Spouse name: Molly Maduro    Number of children: 3   Years of education: Not on file   Highest education level: 12th grade  Occupational History   Occupation: Not Working  Tobacco Use   Smoking status: Former    Current packs/day: 0.00    Average packs/day: 0.3 packs/day for 0.5 years (0.1 ttl pk-yrs)    Types: Cigarettes    Start date: 07/29/2013    Quit date: 01/27/2014    Years since quitting: 9.2   Smokeless tobacco: Never  Vaping Use   Vaping status: Never Used  Substance and Sexual Activity   Alcohol use: No   Drug use: No   Sexual activity: Not Currently  Other Topics Concern   Not on file  Social History Narrative   Not on file   Social Determinants of Health   Financial Resource Strain: Low Risk  (04/18/2023)   Overall Financial Resource Strain (CARDIA)    Difficulty of Paying Living Expenses: Not hard at all  Food Insecurity: No Food Insecurity (04/18/2023)   Hunger Vital Sign    Worried About Running Out of Food in the Last Year: Never true    Ran Out of Food in the Last Year: Never true  Transportation Needs: No Transportation Needs (04/18/2023)   PRAPARE - Administrator, Civil Service (Medical): No    Lack of Transportation (Non-Medical): No  Physical Activity: Insufficiently Active  (04/18/2023)   Exercise Vital Sign    Days of Exercise per Week: 3 days    Minutes of Exercise per Session: 20 min  Stress: No Stress Concern Present (04/18/2023)   [employer] of Occupational Health - Occupational Stress Questionnaire    Feeling of Stress : Not at all  Social Connections: Moderately Isolated (04/18/2023)   Social Connection and Isolation Panel [NHANES]    Frequency of Communication with Friends and Family: More than three times a week    Frequency of Social Gatherings with Friends and Family: Once a week    Attends Religious Services: Never    Database administrator or Organizations: No    Attends Engineer, structural: Never    Marital Status: Married    Tobacco Counseling Counseling given: Not Answered   Clinical Intake:  Pre-visit preparation completed: Yes  Pain : No/denies pain     Diabetes: Yes CBG done?: No Did pt. bring in CBG monitor from home?: No  How often do you need to have someone help you when you read instructions, pamphlets, or other written materials from your doctor or pharmacy?: 1 - Never  Interpreter Needed?: No  Information entered by :: Remi Haggard LPN   Activities of Daily Living    04/18/2023    3:40 PM  In your present state of health, do you have any difficulty performing the following activities:  Hearing? 0  Vision? 0  Difficulty concentrating or making decisions? 0  Walking or climbing stairs? 0  Dressing or bathing? 0  Doing errands, shopping? 0  Preparing Food and eating ? N  Using the Toilet? N  In the past six months, have you accidently leaked urine? Y  Do you have problems with loss of bowel control? N  Managing your Medications? N  Managing your Finances?  N  Housekeeping or managing your Housekeeping? N    Patient Care Team: Alfredo Martinez, MD as PCP - General (Family Medicine)  Indicate any recent Medical Services you may have received from other than [facility] providers in the past year  (date may be approximate).     Assessment:   This is a routine wellness examination for Griselda.  Hearing/Vision screen Hearing Screening - Comments:: No trouble hearing Vision Screening - Comments:: Up to date Unsure of name  [place] [facility]   Goals Addressed             This Visit's Progress    Patient Stated       Would like to be able to walk with just cane       Depression Screen    04/18/2023    3:43 PM 04/04/2023   10:37 AM 02/13/2023    2:59 PM 01/09/2023    3:41 PM 10/14/2022   10:29 AM 07/19/2022    2:14 PM 06/02/2022   10:05 AM  PHQ 2/9 Scores  PHQ - 2 Score 4 3 3 2 2 2  0  PHQ- 9 Score 10 10 9 10 8 8      Fall Risk    04/18/2023    3:37 PM 04/04/2023   10:38 AM 02/13/2023    3:00 PM 06/02/2022   10:05 AM 05/05/2022   11:03 AM  Fall Risk   Falls in the past year? 0 0 0 0 0  Number falls in past yr: 0  0 0 0  Injury with Fall? 0  0 0 0  Risk for fall due to :    Impaired mobility No Fall Risks  Follow up Falls evaluation completed;Education provided;Falls prevention discussed   Falls evaluation completed Falls evaluation completed    MEDICARE RISK AT HOME: Medicare Risk at Home Any stairs in or around the home?: No If so, are there any without handrails?: No Home free of loose throw rugs in walkways, pet beds, electrical cords, etc?: Yes Adequate lighting in your home to reduce risk of falls?: Yes Life alert?: No Use of a cane, walker or w/c?: Yes Grab bars in the bathroom?: Yes Shower chair or bench in shower?: Yes Elevated toilet seat or a handicapped toilet?: Yes  TIMED UP AND GO:  Was the test performed? No    Cognitive Function:        04/18/2023    3:43 PM  6CIT Screen  What Year? 0 points  What month? 0 points  What time? 0 points  Count back from 20 0 points  Months in reverse 0 points  Repeat phrase 0 points  Total Score 0 points    Immunizations Immunization History  Administered Date(s) Administered   Fluad Quad(high  Dose 65+) 07/19/2022   Influenza Split 06/08/2011, 06/12/2015   Influenza Whole 05/15/2007, 04/08/2008   Influenza, Seasonal, Injecte, Preservative Fre 06/08/2011, 03/19/2013, 03/20/2014, 06/12/2015, 03/18/2016, 04/14/2017, 04/12/2018, 03/15/2019, 04/21/2020, 04/20/2021   Influenza,inj,Quad PF,6+ Mos 03/19/2013, 03/20/2014, 03/18/2016, 04/14/2017, 04/12/2018, 03/15/2019, 04/21/2020, 04/20/2021   Janssen (J&J) SARS-COV-2 Vaccination 10/03/2019   PFIZER Comirnaty(Gray Top)Covid-19 Tri-Sucrose Vaccine 04/20/2021   PFIZER(Purple Top)SARS-COV-2 Vaccination 04/20/2021   PNEUMOCOCCAL CONJUGATE-20 07/19/2022   Pneumococcal Polysaccharide-23 12/11/2014   Td 01/02/1996, 06/13/2008   Td (Adult) 01/02/1996, 06/13/2008   Tdap 10/30/2019    TDAP status: Up to date  Flu Vaccine status: Up to date  Pneumococcal vaccine status: Up to date  Covid-19 vaccine status: Information provided on how to obtain vaccines.   Qualifies  for Shingles Vaccine? Yes   Zostavax completed No   Shingrix Completed?: No.    Education has been provided regarding the importance of this vaccine. Patient has been advised to call insurance company to determine out of pocket expense if they have not yet received this vaccine. Advised may also receive vaccine at local pharmacy or Health Dept. Verbalized acceptance and understanding.  Screening Tests Health Maintenance  Topic Date Due   Zoster Vaccines- Shingrix (1 of 2) Never done   FOOT EXAM  09/08/2017   COVID-19 Vaccine (4 - 2023-24 season) 03/05/2023   INFLUENZA VACCINE  10/02/2023 (Originally 02/02/2023)   HEMOGLOBIN A1C  07/12/2023   Diabetic kidney evaluation - eGFR measurement  07/20/2023   OPHTHALMOLOGY EXAM  10/05/2023   Diabetic kidney evaluation - Urine ACR  02/13/2024   Medicare Annual Wellness (AWV)  04/17/2024   Colonoscopy  08/09/2024   MAMMOGRAM  02/13/2025   DTaP/Tdap/Td (6 - Td or Tdap) 10/29/2029   Pneumonia Vaccine 23+ Years old  Completed   DEXA SCAN   Completed   Hepatitis C Screening  Completed   HIV Screening  Completed   HPV VACCINES  Aged Out    Health Maintenance  Health Maintenance Due  Topic Date Due   Zoster Vaccines- Shingrix (1 of 2) Never done   FOOT EXAM  09/08/2017   COVID-19 Vaccine (4 - 2023-24 season) 03/05/2023    Colorectal cancer screening: Type of screening: Colonoscopy. Completed 2026. Repeat every 3 years  Mammogram status: Completed 2024. Repeat every year  Bone Density status: Completed 2024. Results reflect: Bone density results: NORMAL. Repeat every 0 years.  Lung Cancer Screening: (Low Dose CT Chest recommended if Age 81-80 years, 20 pack-year currently smoking OR have quit w/in 15years.) does not qualify.   Lung Cancer Screening Referral:   Additional Screening:  Hepatitis C Screening: does not qualify; Completed 2017  Vision Screening: Recommended annual ophthalmology exams for early detection of glaucoma and other disorders of the eye. Is the patient up to date with their annual eye exam?  Yes  Who is the provider or what is the name of the office in which the patient attends annual eye exams? Unsure of name If pt is not established with a provider, would they like to be referred to a provider to establish care? No .   Dental Screening: Recommended annual dental exams for proper oral hygiene  Nutrition Risk Assessment:  Has the patient had any N/V/D within the last 2 months?  No  Does the patient have any non-healing wounds?  No  Has the patient had any unintentional weight loss or weight gain?  No   Diabetes:  Is the patient diabetic?  Yes  If diabetic, was a CBG obtained today?  No  Did the patient bring in their glucometer from home?  No  How often do you monitor your CBG's? Does not check.   Financial Strains and Diabetes Management:  Are you having any financial strains with the device, your supplies or your medication? No .  Does the patient want to be seen by Chronic Care  Management for management of their diabetes?  No  Would the patient like to be referred to a Nutritionist or for Diabetic Management?  No   Diabetic Exams:  Diabetic Eye Exam: Completed  Pt has been advised about the importance in completing this exam. .  Diabetic Foot Exam: . Pt has been   Publishing rights manager Referral / Chronic Care Management: CRR required this  visit?  No   CCM required this visit?  No     Plan:     I have personally reviewed and noted the following in the patient's chart:   Medical and social history Use of alcohol, tobacco or illicit drugs  Current medications and supplements including opioid prescriptions. Patient is not currently taking opioid prescriptions. Functional ability and status Nutritional status Physical activity Advanced directives List of other physicians Hospitalizations, surgeries, and ER visits in previous 12 months Vitals Screenings to include cognitive, depression, and falls Referrals and appointments  In addition, I have reviewed and discussed with patient certain preventive protocols, quality metrics, and best practice recommendations. A written personalized care plan for preventive services as well as general preventive health recommendations were provided to patient.     Remi Haggard, LPN   16/04/[id number]   After Visit Summary: (MyChart) Due to this being a telephonic visit, the after visit summary with patients personalized plan was offered to patient via MyChart   Nurse Notes:

## 2023-05-09 ENCOUNTER — Other Ambulatory Visit: Payer: Self-pay

## 2023-05-10 MED ORDER — TRAZODONE HCL 50 MG PO TABS: ORAL_TABLET | ORAL | 2 refills | Status: AC

## 2023-05-17 ENCOUNTER — Other Ambulatory Visit: Payer: Self-pay

## 2023-05-17 NOTE — Telephone Encounter (Signed)
Received call from [facility] regarding rx refills.   As this pharmacy was not listed on patient's profile, called patient to determine if she made this request.   Patient reports that she did make this request. [facility] does pill packs for her, she only uses [facility] for short term medications.   She is also requesting to change from Ozempic to [facility], as she feels that she has not lost any weight on medication.   Advised patient that she would need follow up visit to discuss further and obtain A1c.   Scheduled patient for Monday, 11/18.  Veronda Prude, RN

## 2023-05-18 MED ORDER — HYDROCHLOROTHIAZIDE 25 MG PO TABS: 25.0000 mg | ORAL_TABLET | Freq: Every day | ORAL | 2 refills | Status: DC

## 2023-05-18 MED ORDER — AMITRIPTYLINE HCL 75 MG PO TABS: 150.0000 mg | ORAL_TABLET | Freq: Every day | ORAL | 1 refills | Status: DC

## 2023-05-18 MED ORDER — SIMVASTATIN 20 MG PO TABS: 20.0000 mg | ORAL_TABLET | Freq: Every day | ORAL | 2 refills | Status: DC

## 2023-05-22 ENCOUNTER — Ambulatory Visit (INDEPENDENT_AMBULATORY_CARE_PROVIDER_SITE_OTHER): Payer: 59 | Admitting: Student

## 2023-05-22 ENCOUNTER — Encounter: Payer: Self-pay | Admitting: Student

## 2023-05-22 ENCOUNTER — Other Ambulatory Visit: Payer: Self-pay | Admitting: Student

## 2023-05-22 VITALS — BP 129/61 | HR 99 | Ht 63.0 in | Wt 251.0 lb

## 2023-05-22 DIAGNOSIS — E119 Type 2 diabetes mellitus without complications: Secondary | ICD-10-CM | POA: Diagnosis not present

## 2023-05-22 DIAGNOSIS — Z23 Encounter for immunization: Secondary | ICD-10-CM

## 2023-05-22 MED ORDER — MOUNJARO 10 MG/0.5ML ~~LOC~~ SOAJ: 10.0000 mg | SUBCUTANEOUS | 4 refills | Status: DC

## 2023-05-22 NOTE — Assessment & Plan Note (Signed)
Change to Mounjaro 10 mg weekly, stop Ozempic for weight loss benefit with DM control. Uacr UTD, Foot exam performed yesterday. On simvastatin. Recheck A1C at next visit.

## 2023-05-22 NOTE — Progress Notes (Signed)
    SUBJECTIVE:   CHIEF COMPLAINT / HPI:   Type 2 Diabetes: Home medications include: Ozempic 0.5>1mg . Does endorse compliance.  Patient is coming in today because she is expressing frustration with lack of weight loss although she has lost 3 pounds since last visit.  She notes that she is not having a decrease in hunger and feels as if she has stalled.  Would like to switch to [facility].  Most recent A1Cs:  Lab Results  Component Value Date   HGBA1C 6.1 01/09/2023   HGBA1C 6.0 10/14/2022   Patient is up to date on diabetic eye. Patient is up to date on diabetic foot exam.   PERTINENT  PMH / PSH:  HTN GERD Elevated BMI  DM2 Sinus tachycardia   OBJECTIVE:   BP 129/61   Pulse 99   Ht 5\' 3"  (1.6 m)   Wt 251 lb (113.9 kg)   SpO2 98%   BMI 44.46 kg/m   General: Alert and oriented in no apparent distress Heart: Regular rate and rhythm with no murmurs appreciated Lungs: CTA bilaterally, no wheezing Abdomen: No abdominal pain Skin: Warm and dry   ASSESSMENT/PLAN:   Assessment & Plan Type 2 diabetes mellitus without complication, without long-term current use of insulin (HCC) Change to Mounjaro 10 mg weekly, stop Ozempic for weight loss benefit with DM control. Uacr UTD, Foot exam performed yesterday. On simvastatin. Recheck A1C at next visit.  Patient had some elevations on PHQ9 for mood--assess at next visit as well.    Alfredo Martinez, MD [facility]

## 2023-05-22 NOTE — Patient Instructions (Addendum)
It was great to see you today! Thank you for choosing [facility] for your primary care.  Today we addressed: Mounjaro 10 mg ordered to your pharmacy  Please return for weight loss assessment and mood in 3 months   If you haven't already, sign up for My Chart to have easy access to your labs results, and communication with your primary care physician. I recommend that you always bring your medications to each appointment as this makes it easy to ensure you are on the correct medications and helps [place] not miss refills when you need them. Call the clinic at [phone] if your symptoms worsen or you have any concerns. Follow up 3 months  Please arrive 15 minutes before your appointment to ensure smooth check in process.  We appreciate your efforts in making this happen.  Thank you for allowing me to participate in your care, Kristin Martinez, MD 05/22/2023, 3:20 PM PGY-3, [facility]

## 2023-06-15 ENCOUNTER — Other Ambulatory Visit: Payer: Self-pay | Admitting: Student

## 2023-07-11 ENCOUNTER — Telehealth: Payer: Self-pay

## 2023-07-11 NOTE — Telephone Encounter (Signed)
 Patient calls nurse line requesting to increase her dosage of weekly Mounjaro . She took her last dose of the 10 mg dosage today.   She reports that she is tolerating current dosage well with no adverse side effects.   If appropriate, please send new dosage to [facility] [place].   Chiquita JAYSON English, RN

## 2023-07-12 MED ORDER — MOUNJARO 12.5 MG/0.5ML ~~LOC~~ SOAJ: 12.5000 mg | SUBCUTANEOUS | 1 refills | Status: DC

## 2023-07-31 ENCOUNTER — Telehealth: Payer: Self-pay

## 2023-07-31 NOTE — Telephone Encounter (Signed)
Received call from [facility] calls NP regarding patient. She reports that during visit today she performed a PAD screening on patient. She states that R leg had abnormal result of 0.88. Left leg- normal result.   Patient is asymptomatic.   Called patient and scheduled follow up visit with Dr. Jena Gauss on 08/18/23 to discuss this further.   Veronda Prude, RN

## 2023-08-10 ENCOUNTER — Other Ambulatory Visit: Payer: Self-pay | Admitting: Student

## 2023-08-18 ENCOUNTER — Telehealth: Payer: Self-pay

## 2023-08-18 ENCOUNTER — Ambulatory Visit: Payer: 59 | Admitting: Student

## 2023-08-18 ENCOUNTER — Encounter: Payer: Self-pay | Admitting: Student

## 2023-08-18 VITALS — BP 130/60 | HR 90 | Ht 63.0 in | Wt 240.4 lb

## 2023-08-18 DIAGNOSIS — E119 Type 2 diabetes mellitus without complications: Secondary | ICD-10-CM

## 2023-08-18 DIAGNOSIS — Z136 Encounter for screening for cardiovascular disorders: Secondary | ICD-10-CM

## 2023-08-18 LAB — POCT GLYCOSYLATED HEMOGLOBIN (HGB A1C): HbA1c, POC (controlled diabetic range): 5.6 % (ref 0.0–7.0)

## 2023-08-18 MED ORDER — MOUNJARO 15 MG/0.5ML ~~LOC~~ SOAJ: 15.0000 mg | SUBCUTANEOUS | 3 refills | Status: DC

## 2023-08-18 MED ORDER — PANTOPRAZOLE SODIUM 40 MG PO TBEC: 40.0000 mg | DELAYED_RELEASE_TABLET | Freq: Every day | ORAL | 1 refills | Status: DC

## 2023-08-18 NOTE — Telephone Encounter (Signed)
Spoke with patient informed of U/S at [facility] location Feb. 18th at 9:00. Patient took down info. Aquilla Solian, CMA

## 2023-08-18 NOTE — Assessment & Plan Note (Signed)
Mounjaro increase to 15 mg.  Discussed that Mounjaro or any GLP-1 can cause worsening acid reflux symptoms.  Strict return precautions given for any incidental chest pain that she experiences moving forward.  However current belching and acidic taste is likely GERD.  Provided pantoprazole 40 mg, discontinue omeprazole 20 mg.

## 2023-08-18 NOTE — Progress Notes (Signed)
    SUBJECTIVE:   CHIEF COMPLAINT / HPI:   Follow Up on PAD screening: Per nursing note,  'Had PAD screening on patient through [facility]. She states that R leg had abnormal result of 0.88. Left leg- normal result.'  -Patient reports of chronic right hip pain for which she needs a hip replacement -Denies calf pain, cramping, thigh cramping, no worsening pain with ambulation -Patient denies fever, chills, shortness of breath, chest pain  Patient is on Mounjaro for her diabetes, no side effects from [facility] and is ready for an increase in her medication dosage.  Most recent weight 251 > 240 lbs. Reports of belching and acidic taste in her mouth worse since starting mounjaro.   PERTINENT  PMH / PSH:  HTN GERD Elevated BMI  DM2 Sinus tachycardia   OBJECTIVE:   BP 130/60   Pulse 90   Ht 5\' 3"  (1.6 m)   Wt 240 lb 6 oz (109 kg)   SpO2 97%   BMI 42.58 kg/m   General: Alert and oriented in no apparent distress Heart: Regular rate and rhythm with no murmurs appreciated Lungs: CTA bilaterally, no wheezing Abdomen: Bowel sounds present, no abdominal pain Skin: Warm and dry Extremities: No lower extremity edema NTTP RLE  Palpable DP pulses  Normal ROM  No skin abnormalities   ASSESSMENT/PLAN:   Assessment & Plan Encounter for screening for vascular disease Formal ABI testing ordered although does not appear she suffers from PAD.  Type 2 diabetes mellitus without complication, without long-term current use of insulin (HCC) Mounjaro increase to 15 mg.  Discussed that Mounjaro or any GLP-1 can cause worsening acid reflux symptoms.  Strict return precautions given for any incidental chest pain that she experiences moving forward.  However current belching and acidic taste is likely GERD.  Provided pantoprazole 40 mg, discontinue omeprazole 20 mg.     Alfredo Martinez, MD [facility]

## 2023-08-18 NOTE — Patient Instructions (Addendum)
It was great to see you today! Thank you for choosing [facility] for your primary care.  Today we addressed: We can check a formal ABI test, placed order  I have ordered pantoprazole 40 mg daily  Increased Mounjaro 15 mg weekly  We will check labs today   If you haven't already, sign up for My Chart to have easy access to your labs results, and communication with your primary care physician.   Please arrive 15 minutes before your appointment to ensure smooth check in process.  We appreciate your efforts in making this happen.  Thank you for allowing me to participate in your care, Alfredo Martinez, MD 08/18/2023, 10:12 AM PGY-3, [facility]

## 2023-08-19 LAB — BASIC METABOLIC PANEL
BUN/Creatinine Ratio: 12 (ref 12–28)
BUN: 16 mg/dL (ref 8–27)
CO2: 27 mmol/L (ref 20–29)
Calcium: 11.4 mg/dL — ABNORMAL HIGH (ref 8.7–10.3)
Chloride: 96 mmol/L (ref 96–106)
Creatinine, Ser: 1.32 mg/dL — ABNORMAL HIGH (ref 0.57–1.00)
Glucose: 89 mg/dL (ref 70–99)
Potassium: 4.3 mmol/L (ref 3.5–5.2)
Sodium: 139 mmol/L (ref 134–144)
eGFR: 45 mL/min/{1.73_m2} — ABNORMAL LOW (ref >59)

## 2023-08-22 ENCOUNTER — Encounter (HOSPITAL_BASED_OUTPATIENT_CLINIC_OR_DEPARTMENT_OTHER): Payer: 59

## 2023-08-22 ENCOUNTER — Ambulatory Visit (HOSPITAL_BASED_OUTPATIENT_CLINIC_OR_DEPARTMENT_OTHER): Payer: 59

## 2023-08-22 DIAGNOSIS — Z136 Encounter for screening for cardiovascular disorders: Secondary | ICD-10-CM

## 2023-08-22 DIAGNOSIS — M79604 Pain in right leg: Secondary | ICD-10-CM

## 2023-08-24 LAB — VAS US ABI WITH/WO TBI
Left ABI: 1.09
Right ABI: 1.08

## 2023-09-11 ENCOUNTER — Other Ambulatory Visit: Payer: Self-pay | Admitting: Student

## 2023-09-12 ENCOUNTER — Other Ambulatory Visit: Payer: Self-pay | Admitting: Student

## 2023-10-11 ENCOUNTER — Other Ambulatory Visit: Payer: Self-pay | Admitting: Student

## 2023-11-07 ENCOUNTER — Other Ambulatory Visit: Payer: Self-pay | Admitting: Student

## 2023-11-08 ENCOUNTER — Other Ambulatory Visit: Payer: Self-pay | Admitting: Student

## 2023-11-09 ENCOUNTER — Other Ambulatory Visit: Payer: Self-pay

## 2023-11-10 MED ORDER — AMITRIPTYLINE HCL 75 MG PO TABS: 150.0000 mg | ORAL_TABLET | Freq: Every day | ORAL | 1 refills | Status: DC

## 2023-11-28 ENCOUNTER — Ambulatory Visit

## 2023-12-12 ENCOUNTER — Encounter: Payer: Self-pay | Admitting: *Deleted

## 2023-12-13 ENCOUNTER — Other Ambulatory Visit: Payer: Self-pay | Admitting: Student

## 2023-12-19 ENCOUNTER — Ambulatory Visit (INDEPENDENT_AMBULATORY_CARE_PROVIDER_SITE_OTHER): Admitting: Orthopaedic Surgery

## 2023-12-19 ENCOUNTER — Encounter: Payer: Self-pay | Admitting: Orthopaedic Surgery

## 2023-12-19 ENCOUNTER — Other Ambulatory Visit (INDEPENDENT_AMBULATORY_CARE_PROVIDER_SITE_OTHER)

## 2023-12-19 DIAGNOSIS — M25552 Pain in left hip: Secondary | ICD-10-CM

## 2023-12-19 MED ORDER — DOXYCYCLINE HYCLATE 100 MG PO TABS: 100.0000 mg | ORAL_TABLET | Freq: Two times a day (BID) | ORAL | 0 refills | Status: DC

## 2023-12-19 NOTE — Progress Notes (Signed)
 Office Visit Note   Patient: Kristin [name]           Date of Birth: June 08, 1956           MRN: [id number] Visit Date: 12/19/2023              Requested by: Ernestina Headland, MD [address] [place],  [place] [address] PCP: Ernestina Headland, MD   Assessment & Plan: Visit Diagnoses:  1. Pain in left hip     Plan: History of Present Illness Kristin [name] is a 68 year old female who presents with pain in the incision site following a previous surgery.  Has complicated surgical history of left hip.  She experiences significant pain at the front incision site for the past week. There is no fever, chills, or drainage from the incision site. She has not been on antibiotics since November or December 2024, having previously taken doxycycline . She missed a six-month follow-up appointment in [place]. She has noticed recent weight loss but is unsure of the amount.  Physical Exam LEFT HIP: Tenderness at the anterior surgical scar.  Small area of fluctuance without drainage or surrounding cellulitis.  Painless motion of the hip.  Assessment and Plan Superficial soft tissue infection, left hip Pain and tenderness at the anterior incision site probably a superficial infection without systemic symptoms. Possible small skin abscess, no cellulitis.  - Prescribed doxycycline  orally twice daily for two weeks. - Advised daily monitoring of the incision site for changes, including drainage. - Instructed to contact the office if there are any issues or changes in the condition.  Follow-Up Instructions: No follow-ups on file.   Orders:  Orders Placed This Encounter  Procedures   XR HIP UNILAT W OR W/O PELVIS 2-3 VIEWS LEFT   Meds ordered this encounter  Medications   doxycycline  (VIBRA -TABS) 100 MG tablet    Sig: Take 1 tablet (100 mg total) by mouth 2 (two) times daily for 14 days.    Dispense:  28 tablet    Refill:  0     Subjective: Chief Complaint  Patient presents with   Left Hip  - Pain    HPI  Review of Systems  Constitutional: Negative.   HENT: Negative.    Eyes: Negative.   Respiratory: Negative.    Cardiovascular: Negative.   Endocrine: Negative.   Musculoskeletal: Negative.   Neurological: Negative.   Hematological: Negative.   Psychiatric/Behavioral: Negative.    All other systems reviewed and are negative.    Objective: Vital Signs: There were no vitals taken for this visit.  Physical Exam Vitals and nursing note reviewed.  Constitutional:      Appearance: She is well-developed.  HENT:     Head: Atraumatic.     Nose: Nose normal.   Eyes:     Extraocular Movements: Extraocular movements intact.    Cardiovascular:     Pulses: Normal pulses.  Pulmonary:     Effort: Pulmonary effort is normal.  Abdominal:     Palpations: Abdomen is soft.   Musculoskeletal:     Cervical back: Neck supple.   Skin:    General: Skin is warm.     Capillary Refill: Capillary refill takes less than 2 seconds.   Neurological:     Mental Status: She is alert. Mental status is at baseline.   Psychiatric:        Behavior: Behavior normal.        Thought Content: Thought content normal.  Judgment: Judgment normal.     Ortho Exam  Specialty Comments:  No specialty comments available.  Imaging: XR HIP UNILAT W OR W/O PELVIS 2-3 VIEWS LEFT Result Date: 12/19/2023 X-rays of the left hip show stable revision total hip arthroplasty without any complications.    PMFS History: Patient Active Problem List   Diagnosis Date Noted   Postoperative anemia due to acute blood loss 12/10/2021   Presence of left artificial hip joint 12/10/2021   Type 2 diabetes mellitus with obesity (HCC) 12/09/2021   Infection and inflammatory reaction due to other internal joint prosthesis, initial encounter (HCC) 12/09/2021   HTN (hypertension) 12/08/2021   Seizures (HCC) 11/26/2021   Hypercalcemia 10/24/2019   Body mass index 45.0-49.9, adult (HCC) 08/27/2019    Body mass index 40.0-44.9, adult (HCC) 02/15/2018   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HCC) 08/24/2016   Status post left hip replacement 06/01/2016   Sinus tachycardia 05/19/2016   History of adenomatous polyp of colon 03/21/2016   Diabetes (HCC) 01/21/2016   Thyroid  nodule 12/25/2014   Infiltrate noted on imaging study    UNSPECIFIED RENAL SCLEROSIS 01/16/2009   GERD, SEVERE 05/25/2007   Morbid obesity (HCC) 04/03/2007   Dyslipidemia 08/31/2006   Major depression, recurrent (HCC) 08/31/2006   Former smoker 08/31/2006   HYPERTENSION, BENIGN SYSTEMIC 08/31/2006   CONVULSIONS, SEIZURES, NOS 08/31/2006   Past Medical History:  Diagnosis Date   Allergy    seasonal   Anemia    Anxiety    Arthritis    Back pain, chronic    Borderline diabetes    Bronchitis    Constipation    current issue   Cough    Depression    Epilepsy (HCC)    GERD (gastroesophageal reflux disease)    Hyperlipidemia    Hypertension    IBS (irritable bowel syndrome)    Joint pain    Obesity    Palpitations    Pneumonia 2016   Pre-diabetes    Renal sclerosis, unspecified    only has 1 kidney   Rheumatoid arthritis (HCC)    Seizures (HCC)    last >15 +  yrs ago- recorded 07-26-2021   Somatization disorder    Tobacco abuse     Family History  Problem Relation Age of Onset   Other Mother        failed surgery   Heart Problems Father        poss MI, not sure:per pt   Colon cancer Father    Colon cancer Maternal Uncle        not sure age of onset   Alzheimer's disease Maternal Grandmother    Heart disease Maternal Grandfather    Colon cancer Other        grandfather   Colon polyps Neg Hx    Esophageal cancer Neg Hx    Stomach cancer Neg Hx    Rectal cancer Neg Hx     Past Surgical History:  Procedure Laterality Date   ABDOMINAL HYSTERECTOMY     ANTERIOR HIP REVISION Left 07/25/2016   Procedure: LEFT HIP IRRIGATION AND DEBRIDEMENT, REVISION OF HEAD AND LINER;  Surgeon: Wes Hamman,  MD;  Location: [facility] OR;  Service: Orthopedics;  Laterality: Left;   APPLICATION OF WOUND VAC Left 12/23/2019   Procedure: APPLICATION OF WOUND VAC;  Surgeon: Wes Hamman, MD;  Location: [facility];  Service: Orthopedics;  Laterality: Left;   BREAST EXCISIONAL BIOPSY Right    CHOLECYSTECTOMY  COLONOSCOPY     DEBRIDEMENT AND CLOSURE WOUND Left 07/17/2019   Procedure: Excision of left leg wound with ACell placement and primary closure;  Surgeon: Thornell Flirt, DO;  Location: [facility];  Service: Plastics;  Laterality: Left;  60 min   EXCISIONAL TOTAL HIP ARTHROPLASTY WITH ANTIBIOTIC SPACERS Left 12/23/2019   Procedure: LEFT HIP IRRIGATION AND DEBRIDEMENT LEFT HIP WOUND and poly exchange ;  Surgeon: Wes Hamman, MD;  Location: [facility];  Service: Orthopedics;  Laterality: Left;   HIP DEBRIDEMENT Left 01/19/2018   Procedure(s) Performed: IRRIGATION AND DEBRIDEMENT LEFT HIP (Left )   INCISION AND DRAINAGE HIP Left 08/03/2016   Procedure: IRRIGATION AND DEBRIDEMENT LEFT HIP, VAC PLACEMENT;  Surgeon: Wes Hamman, MD;  Location: [facility];  Service: Orthopedics;  Laterality: Left;   INCISION AND DRAINAGE HIP Left 01/19/2018   Procedure: IRRIGATION AND DEBRIDEMENT LEFT HIP;  Surgeon: Wes Hamman, MD;  Location: [facility];  Service: Orthopedics;  Laterality: Left;   kidney stent Right 2002   TONSILLECTOMY Bilateral 12/06/2014   Procedure: TONSILLECTOMY, BILATERAL;  Surgeon: Littie Rife, MD;  Location: [facility] OR;  Service: ENT;  Laterality: Bilateral;   TONSILLECTOMY     TOTAL HIP ARTHROPLASTY Left 06/01/2016   Procedure: LEFT TOTAL HIP ARTHROPLASTY ANTERIOR APPROACH;  Surgeon: Wes Hamman, MD;  Location: [facility];  Service: Orthopedics;  Laterality: Left;   TUBAL LIGATION     WOUND EXPLORATION Left 01/19/2018   Procedure: WOUND EXPLORATION;  Surgeon: Wes Hamman, MD;  Location: [facility];  Service: Orthopedics;  Laterality: Left;   Social History   Occupational History   Occupation: Not Working   Tobacco Use   Smoking status: Former    Current packs/day: 0.00    Average packs/day: 0.3 packs/day for 0.5 years (0.1 ttl pk-yrs)    Types: Cigarettes    Start date: 07/29/2013    Quit date: 01/27/2014    Years since quitting: 9.8   Smokeless tobacco: Never  Vaping Use   Vaping status: Never Used  Substance and Sexual Activity   Alcohol use: No   Drug use: No   Sexual activity: Not Currently

## 2023-12-29 ENCOUNTER — Other Ambulatory Visit: Payer: Self-pay

## 2023-12-29 ENCOUNTER — Telehealth: Payer: Self-pay | Admitting: Orthopaedic Surgery

## 2023-12-29 MED ORDER — DOXYCYCLINE HYCLATE 100 MG PO TABS: 100.0000 mg | ORAL_TABLET | Freq: Two times a day (BID) | ORAL | 0 refills | Status: DC

## 2023-12-29 NOTE — Telephone Encounter (Signed)
 Rx refilled and made appt for Tuesday.

## 2023-12-29 NOTE — Telephone Encounter (Signed)
 Is it cellulitic or any worse than before or just not any better?

## 2023-12-29 NOTE — Telephone Encounter (Signed)
 Pt states the medication doxycycline  she was given for her hip, has not helped (the swelling and pain still the same). Pt states she will be finished with the medication on Monday 01/01/24.  Pt request a call back.

## 2023-12-29 NOTE — Telephone Encounter (Signed)
 Called patient states hip is filling up like a boil.States she is having more swelling than last visit.  More pain but not constant pain. + Ice/ heat/elevates and swelling is not going down. She will be out of Doxy on Monday 6/30. She does not use Mychart and is unable to send pic.

## 2023-12-29 NOTE — Telephone Encounter (Signed)
 Lets refill her Doxy for another couple weeks and I would like to see her on Tuesday please.

## 2024-01-02 ENCOUNTER — Ambulatory Visit (INDEPENDENT_AMBULATORY_CARE_PROVIDER_SITE_OTHER): Admitting: Orthopaedic Surgery

## 2024-01-02 DIAGNOSIS — M25552 Pain in left hip: Secondary | ICD-10-CM | POA: Diagnosis not present

## 2024-01-02 NOTE — Progress Notes (Signed)
 Patient: Kristin [name]           Date of Birth: [birth year]           MRN: [id number] Visit Date: 01/02/2024 PCP: Lorrane Pac, MD   Assessment & Plan:  Chief Complaint:  Chief Complaint  Patient presents with   Left Hip - Follow-up    History of Left THA 05/11/22   Visit Diagnoses:  1. Pain in left hip     Plan: History of Present Illness Kristin [name] is a 68 year old female with a history of infections and boils who presents with a worsening boil.  The current boil has enlarged since her last appointment in July and causes some pain, though less than before. It is not draining. Previous treatment with doxycycline  was ineffective. The boil is associated with past surgical interventions. There is no increased pain or drainage, and no pain with hip movement.  Physical Exam SKIN: Enlarged boil  Assessment and Plan Infection of boil Boil increasing in size, possibly related to previous surgeries. Doxycycline  ineffective. - Drain boil for decompression and relief.  Obtained only 3 cc of blood tinged fluid.  Will send for gram stain and culture - Advise contacting [facility] to expedite appointment due to potential hip joint infection due to prior history.  Follow-Up Instructions: No follow-ups on file.   Orders:  Orders Placed This Encounter  Procedures   Anaerobic and Aerobic Culture   Gram stain   No orders of the defined types were placed in this encounter.   Imaging: No results found.  PMFS History: Patient Active Problem List   Diagnosis Date Noted   Postoperative anemia due to acute blood loss 12/10/2021   Presence of left artificial hip joint 12/10/2021   Type 2 diabetes mellitus with obesity (HCC) 12/09/2021   Infection and inflammatory reaction due to other internal joint prosthesis, initial encounter (HCC) 12/09/2021   HTN (hypertension) 12/08/2021   Seizures (HCC) 11/26/2021   Hypercalcemia 10/24/2019   Body mass index 45.0-49.9, adult  (HCC) 08/27/2019   Body mass index 40.0-44.9, adult (HCC) 02/15/2018   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HCC) 08/24/2016   Status post left hip replacement 06/01/2016   Sinus tachycardia 05/19/2016   History of adenomatous polyp of colon 03/21/2016   Diabetes (HCC) 01/21/2016   Thyroid  nodule 12/25/2014   Infiltrate noted on imaging study    UNSPECIFIED RENAL SCLEROSIS 01/16/2009   GERD, SEVERE 05/25/2007   Morbid obesity (HCC) 04/03/2007   Dyslipidemia 08/31/2006   Major depression, recurrent (HCC) 08/31/2006   Former smoker 08/31/2006   HYPERTENSION, BENIGN SYSTEMIC 08/31/2006   CONVULSIONS, SEIZURES, NOS 08/31/2006   Past Medical History:  Diagnosis Date   Allergy    seasonal   Anemia    Anxiety    Arthritis    Back pain, chronic    Borderline diabetes    Bronchitis    Constipation    current issue   Cough    Depression    Epilepsy (HCC)    GERD (gastroesophageal reflux disease)    Hyperlipidemia    Hypertension    IBS (irritable bowel syndrome)    Joint pain    Obesity    Palpitations    Pneumonia 2016   Pre-diabetes    Renal sclerosis, unspecified    only has 1 kidney   Rheumatoid arthritis (HCC)    Seizures (HCC)    last >15 +  yrs ago- recorded 07-26-2021   Somatization  disorder    Tobacco abuse     Family History  Problem Relation Age of Onset   Other Mother        failed surgery   Heart Problems Father        poss MI, not sure:per pt   Colon cancer Father    Colon cancer Maternal Uncle        not sure age of onset   Alzheimer's disease Maternal Grandmother    Heart disease Maternal Grandfather    Colon cancer Other        grandfather   Colon polyps Neg Hx    Esophageal cancer Neg Hx    Stomach cancer Neg Hx    Rectal cancer Neg Hx     Past Surgical History:  Procedure Laterality Date   ABDOMINAL HYSTERECTOMY     ANTERIOR HIP REVISION Left 07/25/2016   Procedure: LEFT HIP IRRIGATION AND DEBRIDEMENT, REVISION OF HEAD AND LINER;   Surgeon: Kay CHRISTELLA Cummins, MD;  Location: [facility];  Service: Orthopedics;  Laterality: Left;   APPLICATION OF WOUND VAC Left 12/23/2019   Procedure: APPLICATION OF WOUND VAC;  Surgeon: Cummins Kay CHRISTELLA, MD;  Location: [facility];  Service: Orthopedics;  Laterality: Left;   BREAST EXCISIONAL BIOPSY Right    CHOLECYSTECTOMY     COLONOSCOPY     DEBRIDEMENT AND CLOSURE WOUND Left 07/17/2019   Procedure: Excision of left leg wound with ACell placement and primary closure;  Surgeon: Lowery Estefana RAMAN, DO;  Location: [facility];  Service: Plastics;  Laterality: Left;  60 min   EXCISIONAL TOTAL HIP ARTHROPLASTY WITH ANTIBIOTIC SPACERS Left 12/23/2019   Procedure: LEFT HIP IRRIGATION AND DEBRIDEMENT LEFT HIP WOUND and poly exchange ;  Surgeon: Cummins Kay CHRISTELLA, MD;  Location: [facility];  Service: Orthopedics;  Laterality: Left;   HIP DEBRIDEMENT Left 01/19/2018   Procedure(s) Performed: IRRIGATION AND DEBRIDEMENT LEFT HIP (Left )   INCISION AND DRAINAGE HIP Left 08/03/2016   Procedure: IRRIGATION AND DEBRIDEMENT LEFT HIP, VAC PLACEMENT;  Surgeon: Kay CHRISTELLA Cummins, MD;  Location: [facility];  Service: Orthopedics;  Laterality: Left;   INCISION AND DRAINAGE HIP Left 01/19/2018   Procedure: IRRIGATION AND DEBRIDEMENT LEFT HIP;  Surgeon: Cummins Kay CHRISTELLA, MD;  Location: [facility];  Service: Orthopedics;  Laterality: Left;   kidney stent Right 2002   TONSILLECTOMY Bilateral 12/06/2014   Procedure: TONSILLECTOMY, BILATERAL;  Surgeon: Merilee Kraft, MD;  Location[facility];  Service: ENT;  Laterality: Bilateral;   TONSILLECTOMY     TOTAL HIP ARTHROPLASTY Left 06/01/2016   Procedure: LEFT TOTAL HIP ARTHROPLASTY ANTERIOR APPROACH;  Surgeon: Kay CHRISTELLA Cummins, MD;  Location: [facility];  Service: Orthopedics;  Laterality: Left;   TUBAL LIGATION     WOUND EXPLORATION Left 01/19/2018   Procedure: WOUND EXPLORATION;  Surgeon: Cummins Kay CHRISTELLA, MD;  Location: [facility];  Service: Orthopedics;  Laterality: Left;   Social History   Occupational History    Occupation: Not Working  Tobacco Use   Smoking status: Former    Current packs/day: 0.00    Average packs/day: 0.3 packs/day for 0.5 years (0.1 ttl pk-yrs)    Types: Cigarettes    Start date: 07/29/2013    Quit date: 01/27/2014    Years since quitting: 9.9   Smokeless tobacco: Never  Vaping Use   Vaping status: Never Used  Substance and Sexual Activity   Alcohol use: No   Drug use: No   Sexual activity: Not Currently

## 2024-01-08 ENCOUNTER — Other Ambulatory Visit: Payer: Self-pay

## 2024-01-08 DIAGNOSIS — K2100 Gastro-esophageal reflux disease with esophagitis, without bleeding: Secondary | ICD-10-CM

## 2024-01-08 LAB — ANAEROBIC AND AEROBIC CULTURE
AER RESULT:: NO GROWTH
MICRO NUMBER:: 16649767
MICRO NUMBER:: 16649768
SPECIMEN QUALITY:: ADEQUATE
SPECIMEN QUALITY:: ADEQUATE

## 2024-01-08 LAB — TIQ-NTM

## 2024-01-08 MED ORDER — PANTOPRAZOLE SODIUM 40 MG PO TBEC
40.0000 mg | DELAYED_RELEASE_TABLET | Freq: Every day | ORAL | 0 refills | Status: DC
Start: 2024-01-08 — End: 2024-01-08

## 2024-01-08 MED ORDER — PANTOPRAZOLE SODIUM 40 MG PO TBEC: 40.0000 mg | DELAYED_RELEASE_TABLET | Freq: Every day | ORAL | 2 refills | Status: DC

## 2024-01-08 MED ORDER — PANTOPRAZOLE SODIUM 40 MG PO TBEC: 40.0000 mg | DELAYED_RELEASE_TABLET | Freq: Every day | ORAL | 0 refills | Status: DC

## 2024-01-10 ENCOUNTER — Ambulatory Visit (INDEPENDENT_AMBULATORY_CARE_PROVIDER_SITE_OTHER)

## 2024-01-10 ENCOUNTER — Ambulatory Visit: Payer: Self-pay

## 2024-01-10 VITALS — BP 139/70 | HR 103 | Ht 63.0 in | Wt 230.4 lb

## 2024-01-10 DIAGNOSIS — E1169 Type 2 diabetes mellitus with other specified complication: Secondary | ICD-10-CM | POA: Diagnosis not present

## 2024-01-10 DIAGNOSIS — E785 Hyperlipidemia, unspecified: Secondary | ICD-10-CM | POA: Diagnosis not present

## 2024-01-10 DIAGNOSIS — E669 Obesity, unspecified: Secondary | ICD-10-CM | POA: Diagnosis not present

## 2024-01-10 DIAGNOSIS — K2100 Gastro-esophageal reflux disease with esophagitis, without bleeding: Secondary | ICD-10-CM

## 2024-01-10 LAB — POCT GLYCOSYLATED HEMOGLOBIN (HGB A1C): HbA1c, POC (controlled diabetic range): 5.5 % (ref 0.0–7.0)

## 2024-01-10 MED ORDER — PANTOPRAZOLE SODIUM 40 MG PO TBEC: 40.0000 mg | DELAYED_RELEASE_TABLET | Freq: Every day | ORAL | 0 refills | Status: DC

## 2024-01-10 NOTE — Patient Instructions (Addendum)
 Thank you for visiting the clinic today, it was good to see you! In today's visit we discussed:  Type 2 diabetes mellitus: Great work on managing your diabetes, today we ordered labs to monitor your diabetes, which is well-controlled.  Please schedule a visit with your ophthalmologist to monitor any possible development of diabetic retinopathy which can cause blindness.  High Calcium : We repeated labs to make sure that is was not a lab error. We may need to order more labs if it is still high and consider switching off your hydrochlorothiazide .  Weight/Joint pain: Great job with your diet, remember to take lots of water , about 4-5 small bottles/day. We discussed water  aerobic exercises and the use of a stationary bike to include more cardio in your routine. Great job with the weight bearing exercises!  For any questions, please call the office at [phone] or send me a message in MyChart. Have a great day!  Please follow-up in 4-6 months for your diabetes management  -Fairy Amy, MD  [facility] Resident, PGY-1

## 2024-01-10 NOTE — Assessment & Plan Note (Signed)
Ordered lipid panel today 

## 2024-01-10 NOTE — Assessment & Plan Note (Signed)
 BMP ordered, taking HCTZ can consider discontinuing if persistent with normal PTH.

## 2024-01-10 NOTE — Assessment & Plan Note (Signed)
 A1c today 5.5, has been well-controlled for over a year.  Ordered ACR, and BMP

## 2024-01-10 NOTE — Assessment & Plan Note (Signed)
 We discussed complications of long-term PPI therapy, and will consider transitioning to famotidine at next visit

## 2024-01-10 NOTE — Assessment & Plan Note (Signed)
 She has lost 10 pounds since last visit about 5 months ago, and 30 pounds total.  We discussed adequate hydration taking Mounjaro , and increasing aerobic exercise with water  aerobics, resistance bands, and stationary bicycle.  He is doing really well with diet, cut out all soda and fast foods.  She is also doing weightbearing exercises 3-4 times a week.

## 2024-01-10 NOTE — Progress Notes (Signed)
    SUBJECTIVE:   CHIEF COMPLAINT / HPI:   T2DM: Well-controlled, A1c today 5.5.  Taking Mounjaro  at max dose.  Denies worsening of any symptoms of vascular disease including vision, neuropathy, and kidney function.  Doing well with diet, clearly well-educated on nutrition, encouraged to drink more water .  Morbid obesity: On max dose of Mounjaro  has lost 10 pounds since last visit in February, and 30 pounds total.  We discussed importance of than including exercise with weight loss.  She is doing weightbearing exercises 3-4 times a week and walking around her apartment complex about twice a week.  Has not done more aerobics due to joint pain from hip replacements.  Hypercalcemia: BMP 08/18/2023 had calcium  of 11.4, has been taking hydrochlorothiazide  for years, no PTH on file.  No symptoms peri-oral or extremity paresthesias.  DEXA done June of last year, and was normal.  Dyslipidemia: Due for repeat lipid panel, see obesity problem.  GERD: Has been well-controlled on 40 mg pantoprazole , only been taking for the last 4 months.    PERTINENT  PMH / PSH: Left hip replacement x 5   OBJECTIVE:   BP 139/70   Pulse (!) 103   Ht 5' 3 (1.6 m)   Wt 230 lb 6.4 oz (104.5 kg)   BMI 40.81 kg/m    Cardiac: Regular rate and rhythm. Normal S1/S2. No murmurs, rubs, or gallops appreciated. Lungs: Clear bilaterally to ascultation.  Abdomen: Normoactive bowel sounds. No tenderness to deep or light palpation. No rebound or guarding.    Psych: Pleasant and appropriate    ASSESSMENT/PLAN:   Assessment & Plan Type 2 diabetes mellitus with obesity (HCC) A1c today 5.5, has been well-controlled for over a year.  Ordered ACR, and BMP Morbid obesity (HCC) She has lost 10 pounds since last visit about 5 months ago, and 30 pounds total.  We discussed adequate hydration taking Mounjaro , and increasing aerobic exercise with water  aerobics, resistance bands, and stationary bicycle.  He is doing really well with  diet, cut out all soda and fast foods.  She is also doing weightbearing exercises 3-4 times a week. Hypercalcemia BMP ordered, taking HCTZ can consider discontinuing if persistent with normal PTH. Dyslipidemia Ordered lipid panel today Gastroesophageal reflux disease with esophagitis, unspecified whether hemorrhage We discussed complications of long-term PPI therapy, and will consider transitioning to famotidine at next visit     Fairy Amy, MD [facility]

## 2024-01-11 ENCOUNTER — Other Ambulatory Visit: Payer: Self-pay

## 2024-01-11 LAB — MICROALBUMIN / CREATININE URINE RATIO
Creatinine, Urine: 80.4 mg/dL
Microalb/Creat Ratio: <4 mg/g{creat} (ref 0–29)
Microalbumin, Urine: <3 ug/mL

## 2024-01-11 LAB — BASIC METABOLIC PANEL WITH GFR
BUN/Creatinine Ratio: 12 (ref 12–28)
BUN: 14 mg/dL (ref 8–27)
CO2: 25 mmol/L (ref 20–29)
Calcium: 10.6 mg/dL — ABNORMAL HIGH (ref 8.7–10.3)
Chloride: 99 mmol/L (ref 96–106)
Creatinine, Ser: 1.13 mg/dL — ABNORMAL HIGH (ref 0.57–1.00)
Glucose: 83 mg/dL (ref 70–99)
Potassium: 4.8 mmol/L (ref 3.5–5.2)
Sodium: 140 mmol/L (ref 134–144)
eGFR: 54 mL/min/1.73 — ABNORMAL LOW (ref >59)

## 2024-01-11 LAB — LIPID PANEL
Chol/HDL Ratio: 2.5 ratio (ref 0.0–4.4)
Cholesterol, Total: 127 mg/dL (ref 100–199)
HDL: 50 mg/dL (ref >39)
LDL Chol Calc (NIH): 55 mg/dL (ref 0–99)
Triglycerides: 122 mg/dL (ref 0–149)
VLDL Cholesterol Cal: 22 mg/dL (ref 5–40)

## 2024-01-11 MED ORDER — AMITRIPTYLINE HCL 75 MG PO TABS: 150.0000 mg | ORAL_TABLET | Freq: Every day | ORAL | 1 refills | Status: DC

## 2024-01-11 NOTE — Telephone Encounter (Signed)
 Will refill, but would like to discuss switching to a different med at next visit.

## 2024-01-15 ENCOUNTER — Other Ambulatory Visit: Payer: Self-pay | Admitting: Orthopaedic Surgery

## 2024-01-15 NOTE — Telephone Encounter (Signed)
 Xu, want me to continue this?

## 2024-01-15 NOTE — Telephone Encounter (Signed)
 Yes that's fine.  Looks like that's what orthocarolina wants her to do until follow up appointment.

## 2024-01-16 ENCOUNTER — Other Ambulatory Visit: Payer: Self-pay | Admitting: Orthopaedic Surgery

## 2024-01-17 ENCOUNTER — Other Ambulatory Visit: Payer: Self-pay

## 2024-01-17 DIAGNOSIS — Z1231 Encounter for screening mammogram for malignant neoplasm of breast: Secondary | ICD-10-CM

## 2024-02-06 ENCOUNTER — Other Ambulatory Visit: Payer: Self-pay

## 2024-02-06 MED ORDER — SIMVASTATIN 20 MG PO TABS: 20.0000 mg | ORAL_TABLET | Freq: Every day | ORAL | 2 refills | Status: DC

## 2024-02-06 MED ORDER — HYDROCHLOROTHIAZIDE 25 MG PO TABS: 25.0000 mg | ORAL_TABLET | Freq: Every day | ORAL | 2 refills | Status: DC

## 2024-02-06 NOTE — Telephone Encounter (Signed)
 Chart reviewed  -Fairy Amy, MD

## 2024-02-15 ENCOUNTER — Ambulatory Visit
Admission: RE | Admit: 2024-02-15 | Discharge: 2024-02-15 | Disposition: A | Discharge status: Home / Self Care | Source: Ambulatory Visit | Attending: Family Medicine | Admitting: Family Medicine

## 2024-02-15 DIAGNOSIS — Z1231 Encounter for screening mammogram for malignant neoplasm of breast: Secondary | ICD-10-CM

## 2024-02-16 ENCOUNTER — Other Ambulatory Visit: Payer: Self-pay

## 2024-02-16 ENCOUNTER — Other Ambulatory Visit: Payer: Self-pay | Admitting: Physician Assistant

## 2024-02-19 MED ORDER — MOUNJARO 15 MG/0.5ML ~~LOC~~ SOAJ: 15.0000 mg | SUBCUTANEOUS | 2 refills | Status: DC

## 2024-02-19 NOTE — Telephone Encounter (Signed)
 Chart reviewed  -Fairy Amy, MD

## 2024-03-25 ENCOUNTER — Other Ambulatory Visit: Payer: Self-pay

## 2024-03-25 MED ORDER — AMITRIPTYLINE HCL 75 MG PO TABS: 150.0000 mg | ORAL_TABLET | Freq: Every day | ORAL | 1 refills | Status: DC

## 2024-03-25 MED ORDER — SIMVASTATIN 20 MG PO TABS: 20.0000 mg | ORAL_TABLET | Freq: Every day | ORAL | 2 refills | Status: DC

## 2024-03-25 MED ORDER — HYDROCHLOROTHIAZIDE 25 MG PO TABS: 25.0000 mg | ORAL_TABLET | Freq: Every day | ORAL | 2 refills | Status: DC

## 2024-03-25 NOTE — Telephone Encounter (Signed)
 [facility] calls nurse line requesting (3) medications refills.   This pharmacy is on patients profile.   Will forward to PCP for refills.

## 2024-03-25 NOTE — Telephone Encounter (Signed)
 Chart reviewed  -Fairy Amy, MD

## 2024-03-26 ENCOUNTER — Other Ambulatory Visit: Payer: Self-pay

## 2024-03-26 ENCOUNTER — Ambulatory Visit (INDEPENDENT_AMBULATORY_CARE_PROVIDER_SITE_OTHER): Admitting: Infectious Diseases

## 2024-03-26 VITALS — BP 145/85 | HR 112 | Temp 98.3°F | Resp 16 | Wt 228.4 lb

## 2024-03-26 DIAGNOSIS — T8452XA Infection and inflammatory reaction due to internal left hip prosthesis, initial encounter: Secondary | ICD-10-CM | POA: Diagnosis not present

## 2024-03-26 DIAGNOSIS — Z79899 Other long term (current) drug therapy: Secondary | ICD-10-CM | POA: Diagnosis not present

## 2024-03-26 DIAGNOSIS — A4902 Methicillin resistant Staphylococcus aureus infection, unspecified site: Secondary | ICD-10-CM

## 2024-03-26 DIAGNOSIS — M069 Rheumatoid arthritis, unspecified: Secondary | ICD-10-CM

## 2024-03-26 MED ORDER — DOXYCYCLINE HYCLATE 100 MG PO TABS: 100.0000 mg | ORAL_TABLET | Freq: Two times a day (BID) | ORAL | 2 refills | Status: DC

## 2024-03-26 NOTE — Progress Notes (Addendum)
 Patient Active Problem List   Diagnosis Date Noted   Presence of left artificial hip joint 12/10/2021   Type 2 diabetes mellitus with obesity 12/09/2021   HTN (hypertension) 12/08/2021   Seizures (HCC) 11/26/2021   Hypercalcemia 10/24/2019   Body mass index 40.0-44.9, adult (HCC) 02/15/2018   Prosthetic joint infection of left hip 08/24/2016   Status post left hip replacement 06/01/2016   Sinus tachycardia 05/19/2016   Thyroid  nodule 12/25/2014   Infiltrate noted on imaging study    UNSPECIFIED RENAL SCLEROSIS 01/16/2009   GERD, SEVERE 05/25/2007   Morbid obesity (HCC) 04/03/2007   Dyslipidemia 08/31/2006   Former smoker 08/31/2006   HYPERTENSION, BENIGN SYSTEMIC 08/31/2006   CONVULSIONS, SEIZURES, NOS 08/31/2006   Current Outpatient Medications on File Prior to Visit  Medication Sig Dispense Refill   acetaminophen  (TYLENOL ) 650 MG CR tablet Take 1,300 mg by mouth every 8 (eight) hours as needed for pain.     amitriptyline  (ELAVIL ) 75 MG tablet Take 2 tablets (150 mg total) by mouth at bedtime. 62 tablet 1   Ascorbic Acid (VITAMIN C) 250 MG CHEW Chew 250 mg by mouth daily.     aspirin  EC 81 MG tablet Take 81 mg by mouth daily. Swallow whole.     cholecalciferol (VITAMIN D3) 25 MCG (1000 UNIT) tablet Take 1,000 Units by mouth daily.     ferrous sulfate  325 (65 FE) MG EC tablet Take by mouth.     hydrochlorothiazide  (HYDRODIURIL ) 25 MG tablet Take 1 tablet (25 mg total) by mouth daily. 30 tablet 2   ibuprofen  (ADVIL ) 200 MG tablet Take 400 mg by mouth every 6 (six) hours as needed for moderate pain.     pantoprazole  (PROTONIX ) 40 MG tablet Take 1 tablet (40 mg total) by mouth daily. 30 tablet 0   polyethylene glycol powder (GLYCOLAX /MIRALAX ) 17 GM/SCOOP powder Take 1 Container by mouth once.     pregabalin  (LYRICA ) 75 MG capsule Take 75 mg by mouth 2 (two) times daily.     simvastatin  (ZOCOR ) 20 MG tablet Take 1 tablet (20 mg total) by mouth at bedtime. 30 tablet 2    tirzepatide  (MOUNJARO ) 15 MG/0.5ML Pen Inject 15 mg into the skin once a week. 4 mL 2   traZODone  (DESYREL ) 50 MG tablet TAKE 1/2-1 TABLET AT BEDTIME as needed. 30 tablet 2   zinc  gluconate 50 MG tablet Take by mouth.     No current facility-administered medications on file prior to visit.    Subjective: Discussed the use of AI scribe software for clinical note transcription with the patient, who gave verbal consent to proceed.   68 Y O Female with PMH as below including RA,  Left THA in 2017, Left hip PJI  I and D with head and liner exchange 07/25/2016 s/p treatment with Vancomycin  for 6 weeks then PO doxycycline  suppression ( cx MRSA), s/p I and D left hip in 08/03/2016, 01/19/2018, Left leg excisional debridement and wound closure 08/04/2019, Left hip excision arthroplasty with antibiotic spacers in 12/23/2019 (OR Cx with Prevotella bivia) s/p treatment with Vancomycin , rifampin , metronidazole  s/p 6 weeks course followed by PO doxycycline  stopped pn 10/13/21 for preparation of 2 stage reconstruction. She underwent  resection arthroplasty with spacer placement  12/08/21 s/p 6 weeks of IV Vancomycin , ceftriaxone and metronidazole  through 01/20/22 ( OR cx NG). Underwent revision of left hip arthroplasty on 05/11/2022 ( OR cx NG) and started on post op doxycycline  briefly for some post op wound drainage  then stopped, who is referred from Orthopedics for concerns of wound in Left hip.   She reports opening of wound in Left hip for approx 3 months spontaneously, initially measured the size of a fifty-cent piece. The wound drains yellowish pus and occasionally bleeds. Dressing changes have decreased from three times a day to once daily as the wound size has decreased in size.  She completed course of PO Vancomycin  yesterday and was taking 5 courses of 14 days doxycycline  since  12/19/23 while following with Orthopedics as well as 1 14 days course of PO bactrim  prescribed 8/22. Aspiration of boil in left hip on  01/02/24 with no growth.   Denies fever, chills, nausea, vomiting, diarrhea, rashes,  cough, chest pain, or shortness of breath.  Review of Systems: all systems reviewed with pertinent positives and negatives as listed above  Past Medical History:  Diagnosis Date   Allergy    seasonal   Anemia    Anxiety    Arthritis    Back pain, chronic    Borderline diabetes    Bronchitis    Constipation    current issue   Cough    Depression    Epilepsy (HCC)    GERD (gastroesophageal reflux disease)    Hyperlipidemia    Hypertension    IBS (irritable bowel syndrome)    Joint pain    Obesity    Palpitations    Pneumonia 2016   Pre-diabetes    Renal sclerosis, unspecified    only has 1 kidney   Rheumatoid arthritis (HCC)    Seizures (HCC)    last >15 +  yrs ago- recorded 07-26-2021   Somatization disorder    Tobacco abuse    Past Surgical History:  Procedure Laterality Date   ABDOMINAL HYSTERECTOMY     ANTERIOR HIP REVISION Left 07/25/2016   Procedure: LEFT HIP IRRIGATION AND DEBRIDEMENT, REVISION OF HEAD AND LINER;  Surgeon: Kay CHRISTELLA Cummins, MD;  Location: [facility] OR;  Service: Orthopedics;  Laterality: Left;   APPLICATION OF WOUND VAC Left 12/23/2019   Procedure: APPLICATION OF WOUND VAC;  Surgeon: Cummins Kay CHRISTELLA, MD;  Location: [facility] OR;  Service: Orthopedics;  Laterality: Left;   BREAST EXCISIONAL BIOPSY Right    CHOLECYSTECTOMY     COLONOSCOPY     DEBRIDEMENT AND CLOSURE WOUND Left 07/17/2019   Procedure: Excision of left leg wound with ACell placement and primary closure;  Surgeon: Lowery Estefana RAMAN, DO;  Location: [facility];  Service: Plastics;  Laterality: Left;  60 min   EXCISIONAL TOTAL HIP ARTHROPLASTY WITH ANTIBIOTIC SPACERS Left 12/23/2019   Procedure: LEFT HIP IRRIGATION AND DEBRIDEMENT LEFT HIP WOUND and poly exchange ;  Surgeon: Cummins Kay CHRISTELLA, MD;  Location: [facility] OR;  Service: Orthopedics;  Laterality: Left;   HIP DEBRIDEMENT Left 01/19/2018   Procedure(s)  Performed: IRRIGATION AND DEBRIDEMENT LEFT HIP (Left )   INCISION AND DRAINAGE HIP Left 08/03/2016   Procedure: IRRIGATION AND DEBRIDEMENT LEFT HIP, VAC PLACEMENT;  Surgeon: Kay CHRISTELLA Cummins, MD;  Location: [facility] OR;  Service: Orthopedics;  Laterality: Left;   INCISION AND DRAINAGE HIP Left 01/19/2018   Procedure: IRRIGATION AND DEBRIDEMENT LEFT HIP;  Surgeon: Cummins Kay CHRISTELLA, MD;  Location: [facility] OR;  Service: Orthopedics;  Laterality: Left;   kidney stent Right 2002   TONSILLECTOMY Bilateral 12/06/2014   Procedure: TONSILLECTOMY, BILATERAL;  Surgeon: Merilee Kraft, MD;  Location: [facility] OR;  Service: ENT;  Laterality: Bilateral;   TONSILLECTOMY     TOTAL  HIP ARTHROPLASTY Left 06/01/2016   Procedure: LEFT TOTAL HIP ARTHROPLASTY ANTERIOR APPROACH;  Surgeon: Kay CHRISTELLA Cummins, MD;  Location: [facility] OR;  Service: Orthopedics;  Laterality: Left;   TUBAL LIGATION     WOUND EXPLORATION Left 01/19/2018   Procedure: WOUND EXPLORATION;  Surgeon: Cummins Kay CHRISTELLA, MD;  Location: [facility] OR;  Service: Orthopedics;  Laterality: Left;    Social History   Tobacco Use   Smoking status: Former    Current packs/day: 0.00    Average packs/day: 0.3 packs/day for 0.5 years (0.1 ttl pk-yrs)    Types: Cigarettes    Start date: 07/29/2013    Quit date: 01/27/2014    Years since quitting: 10.1   Smokeless tobacco: Never  Vaping Use   Vaping status: Never Used  Substance Use Topics   Alcohol use: No   Drug use: No    Family History  Problem Relation Age of Onset   Other Mother        failed surgery   Heart Problems Father        poss MI, not sure:per pt   Colon cancer Father    Colon cancer Maternal Uncle        not sure age of onset   Alzheimer's disease Maternal Grandmother    Heart disease Maternal Grandfather    Colon cancer Other        grandfather   Colon polyps Neg Hx    Esophageal cancer Neg Hx    Stomach cancer Neg Hx    Rectal cancer Neg Hx     Allergies  Allergen Reactions   Fluconazole  Swelling and Other (See  Comments)    Face swelling   Robitussin (Alcohol Free) [Guaifenesin ] Palpitations and Other (See Comments)    Chest pain   Robitussin Dm Max Day-Night Palpitations    Health Maintenance  Topic Date Due   Zoster Vaccines- Shingrix (1 of 2) Never done   FOOT EXAM  09/08/2017   OPHTHALMOLOGY EXAM  10/05/2023   Influenza Vaccine  02/02/2024   COVID-19 Vaccine (4 - 2025-26 season) 03/04/2024   Medicare Annual Wellness (AWV)  04/17/2024   HEMOGLOBIN A1C  07/12/2024   Colonoscopy  08/09/2024   Diabetic kidney evaluation - eGFR measurement  01/09/2025   Diabetic kidney evaluation - Urine ACR  01/09/2025   Mammogram  02/14/2026   DTaP/Tdap/Td (6 - Td or Tdap) 10/29/2029   Pneumococcal Vaccine: 50+ Years  Completed   DEXA SCAN  Completed   Hepatitis C Screening  Completed   HPV VACCINES  Aged Out   Meningococcal B Vaccine  Aged Out    Objective: BP (!) 145/85   Pulse (!) 112   Temp 98.3 F (36.8 C) (Oral)   Resp 16   Wt 228 lb 6.4 oz (103.6 kg)   SpO2 99%   BMI 40.46 kg/m    Physical Exam Constitutional:      Appearance: Normal appearance. Morbidly obese  HENT:     Head: Normocephalic and atraumatic.      Mouth: Mucous membranes are moist.  Eyes:    Conjunctiva/sclera: Conjunctivae normal.     Pupils: Pupils are equal, round, and b/l symmetrical    Cardiovascular:     Rate and Rhythm: Normal rate and regular rhythm.     Heart sounds: s1s2  Pulmonary:     Effort: Pulmonary effort is normal.     Breath sounds: Normal breath sounds.   Abdominal:     General: Non distended  Palpations: soft.   Musculoskeletal:        General: sitting in the chair , Left hip with open sinus, no active drainage, no signs of cellulitis or active signs of infection    Skin:    General: Skin is warm and dry.     Comments:  Neurological:     General: grossly non focal     Mental Status: awake, alert and oriented to person, place, and time.   Psychiatric:        Mood and  Affect: Mood normal.   Lab Results Lab Results  Component Value Date   WBC 7.1 12/24/2019   HGB 8.5 (L) 12/24/2019   HCT 26.9 (L) 12/24/2019   MCV 92.1 12/24/2019   PLT 293 12/24/2019    Lab Results  Component Value Date   CREATININE 1.13 (H) 01/10/2024   BUN 14 01/10/2024   NA 140 01/10/2024   K 4.8 01/10/2024   CL 99 01/10/2024   CO2 25 01/10/2024    Lab Results  Component Value Date   ALT 19 10/18/2021   AST 12 10/18/2021   ALKPHOS 111 10/18/2021   BILITOT <0.2 10/18/2021    Lab Results  Component Value Date   CHOL 127 01/10/2024   HDL 50 01/10/2024   LDLCALC 55 01/10/2024   TRIG 122 01/10/2024   CHOLHDL 2.5 01/10/2024   No results found for: LABRPR, RPRTITER No results found for: HIV1RNAQUANT, HIV1RNAVL, CD4TABS   Microbiology Results for orders placed or performed in visit on 01/02/24  Anaerobic and Aerobic Culture     Status: None   Collection Time: 01/02/24 11:37 AM   Specimen: Synovial, Left Hip; Synovial Fluid  Result Value Ref Range Status   MICRO NUMBER: [id number]  Final   SPECIMEN QUALITY: Adequate  Final   Source: NOT GIVEN  Final   STATUS: FINAL  Final   GRAM STAIN:   Final    Few White blood cells seen No epithelial cells seen No organisms seen   ANA RESULT: No anaerobes isolated.  Final   MICRO NUMBER: [id number]  Final   SPECIMEN QUALITY: Adequate  Final   SOURCE: NOT GIVEN  Final   STATUS: FINAL  Final   AER RESULT: No Growth  Final   COMMENT:   Final    No source was provided. The specimen was tested and reported based upon the test code ordered. If this is incorrect, please contact client services.   *Note: Due to a large number of results and/or encounters for the requested time period, some results have not been displayed. A complete set of results can be found in Results Review.    Assessment/Plan # Left Hip PJI with sinus - last seen by Ortho 9/2,  She is not a great candidate for a second stage procedure due to the  implant that is in place which be very difficult to remove. I will see her back in the office in 1 month to see how she is doing. She can continue with dressing changes as needed. I encouraged her to finish out her Bactrim  she only has a few days left.   Plan - continue PO doxycycline , likely related to prior MRSA infection. Given patient report of decrease in size of wound, plan for prolonged course esp no plans for surgery - labs today - fu in 1 month  # Morbid Obesity - will benefit from weight loss  # RA  - not on tx  I spent 60 minutes involved  in face-to-face and non-face-to-face activities for this patient on the day of the visit. Professional time spent includes the following activities: Preparing to see the patient (review of tests), Obtaining and reviewing separately obtained history (Outside referral note), Performing a medically appropriate examination and evaluation, Ordering medications/labs, referring and communicating with other health care professionals, Documenting clinical information in the EMR, Independently interpreting results (not separately reported), Communicating results to the patient, Counseling and educating the patient and Care coordination (not separately reported).   Of note, portions of this note may have been created with voice recognition software. While this note has been edited for accuracy, occasional wrong-word or 'sound-a-like' substitutions may have occurred due to the inherent limitations of voice recognition software.   Annalee Joseph, MD [facility] 03/26/2024, 1:29 PM

## 2024-03-27 ENCOUNTER — Other Ambulatory Visit: Payer: Self-pay

## 2024-03-27 ENCOUNTER — Ambulatory Visit: Payer: Self-pay | Admitting: Infectious Diseases

## 2024-03-27 LAB — BASIC METABOLIC PANEL WITH GFR
BUN/Creatinine Ratio: 13 (calc) (ref 6–22)
BUN: 14 mg/dL (ref 7–25)
CO2: 32 mmol/L (ref 20–32)
Calcium: 10.2 mg/dL (ref 8.6–10.4)
Chloride: 99 mmol/L (ref 98–110)
Creat: 1.06 mg/dL — ABNORMAL HIGH (ref 0.50–1.05)
Glucose, Bld: 77 mg/dL (ref 65–99)
Potassium: 3.8 mmol/L (ref 3.5–5.3)
Sodium: 140 mmol/L (ref 135–146)
eGFR: 58 mL/min/1.73m2 — ABNORMAL LOW (ref >=60)

## 2024-03-27 LAB — CBC
HCT: 37.5 % (ref 35.0–45.0)
Hemoglobin: 11.8 g/dL (ref 11.7–15.5)
MCH: 28.9 pg (ref 27.0–33.0)
MCHC: 31.5 g/dL — ABNORMAL LOW (ref 32.0–36.0)
MCV: 91.9 fL (ref 80.0–100.0)
MPV: 9.5 fL (ref 7.5–12.5)
Platelets: 302 Thousand/uL (ref 140–400)
RBC: 4.08 Million/uL (ref 3.80–5.10)
RDW: 13.6 % (ref 11.0–15.0)
WBC: 5.4 Thousand/uL (ref 3.8–10.8)

## 2024-03-27 LAB — SEDIMENTATION RATE: Sed Rate: 48 mm/h — ABNORMAL HIGH (ref 0–30)

## 2024-03-27 LAB — C-REACTIVE PROTEIN: CRP: 11.5 mg/L — ABNORMAL HIGH (ref <8.0)

## 2024-03-27 MED ORDER — DOXYCYCLINE HYCLATE 100 MG PO TABS: 100.0000 mg | ORAL_TABLET | Freq: Two times a day (BID) | ORAL | 2 refills | Status: DC

## 2024-03-31 DIAGNOSIS — A4902 Methicillin resistant Staphylococcus aureus infection, unspecified site: Secondary | ICD-10-CM | POA: Insufficient documentation

## 2024-04-05 ENCOUNTER — Telehealth: Payer: Self-pay

## 2024-04-05 NOTE — Telephone Encounter (Signed)
 Received fax from [facility] [employer] for medical clearance on upcoming dental procedure. Form completed by MD and faxed to [phone] For left in scan folder and triage  Lorenda CHRISTELLA Code, RMA

## 2024-05-06 ENCOUNTER — Ambulatory Visit

## 2024-05-06 ENCOUNTER — Encounter: Payer: Self-pay | Admitting: Radiology

## 2024-05-06 VITALS — Ht 62.0 in | Wt 221.0 lb

## 2024-05-06 DIAGNOSIS — Z0000 Encounter for general adult medical examination without abnormal findings: Secondary | ICD-10-CM | POA: Diagnosis not present

## 2024-05-06 NOTE — Patient Instructions (Signed)
 Kristin [name],  Thank you for taking the time for your Medicare Wellness Visit. I appreciate your continued commitment to your health goals. Please review the care plan we discussed, and feel free to reach out if I can assist you further.  Please note that Annual Wellness Visits do not include a physical exam. Some assessments may be limited, especially if the visit was conducted virtually. If needed, we may recommend an in-person follow-up with your provider.  Ongoing Care Seeing your primary care provider every 3 to 6 months helps us  monitor your health and provide consistent, personalized care.   Referrals If a referral was made during today's visit and you haven't received any updates within two weeks, please contact the referred provider directly to check on the status.  Recommended Screenings:  Health Maintenance  Topic Date Due   Zoster (Shingles) Vaccine (1 of 2) Never done   Complete foot exam   09/08/2017   Eye exam for diabetics  10/05/2023   Flu Shot  02/02/2024   COVID-19 Vaccine (3 - 2025-26 season) 03/04/2024   Colon Cancer Screening  08/09/2024   Hemoglobin A1C  07/12/2024   Yearly kidney health urinalysis for diabetes  01/09/2025   Yearly kidney function blood test for diabetes  03/26/2025   Medicare Annual Wellness Visit  05/06/2025   Breast Cancer Screening  02/14/2026   DTaP/Tdap/Td vaccine (6 - Td or Tdap) 10/29/2029   Pneumococcal Vaccine for age over 91  Completed   DEXA scan (bone density measurement)  Completed   Hepatitis C Screening  Completed   Meningitis B Vaccine  Aged Out       05/06/2024    9:57 AM  Advanced Directives  Does Patient Have a Medical Advance Directive? No  Would patient like information on creating a medical advance directive? No - Patient declined    Vision: Annual vision screenings are recommended for early detection of glaucoma, cataracts, and diabetic retinopathy. These exams can also reveal signs of chronic conditions such as  diabetes and high blood pressure.  Dental: Annual dental screenings help detect early signs of oral cancer, gum disease, and other conditions linked to overall health, including heart disease and diabetes.  Please see the attached documents for additional preventive care recommendations.

## 2024-05-06 NOTE — Progress Notes (Addendum)
 Subjective:   Kristin [name] is a 68 y.o. female who presents for a Medicare Annual Wellness Visit.  Allergies (verified) Fluconazole , Robitussin (alcohol free) [guaifenesin ], and Robitussin dm max day-night   History: Past Medical History:  Diagnosis Date   Allergy    seasonal   Anemia    Anxiety    Arthritis    Back pain, chronic    Borderline diabetes    Bronchitis    Constipation    current issue   Cough    Depression    Epilepsy (HCC)    GERD (gastroesophageal reflux disease)    Hyperlipidemia    Hypertension    IBS (irritable bowel syndrome)    Joint pain    Obesity    Palpitations    Pneumonia 2016   Pre-diabetes    Renal sclerosis, unspecified    only has 1 kidney   Rheumatoid arthritis (HCC)    Seizures (HCC)    last >15 +  yrs ago- recorded 07-26-2021   Somatization disorder    Tobacco abuse    Past Surgical History:  Procedure Laterality Date   ABDOMINAL HYSTERECTOMY     ANTERIOR HIP REVISION Left 07/25/2016   Procedure: LEFT HIP IRRIGATION AND DEBRIDEMENT, REVISION OF HEAD AND LINER;  Surgeon: Kay CHRISTELLA Cummins, MD;  Location: [facility];  Service: Orthopedics;  Laterality: Left;   APPLICATION OF WOUND VAC Left 12/23/2019   Procedure: APPLICATION OF WOUND VAC;  Surgeon: Cummins Kay CHRISTELLA, MD;  Location: [facility];  Service: Orthopedics;  Laterality: Left;   BREAST EXCISIONAL BIOPSY Right    CHOLECYSTECTOMY     COLONOSCOPY     DEBRIDEMENT AND CLOSURE WOUND Left 07/17/2019   Procedure: Excision of left leg wound with ACell placement and primary closure;  Surgeon: Lowery Estefana RAMAN, DO;  Location: [facility];  Service: Plastics;  Laterality: Left;  60 min   EXCISIONAL TOTAL HIP ARTHROPLASTY WITH ANTIBIOTIC SPACERS Left 12/23/2019   Procedure: LEFT HIP IRRIGATION AND DEBRIDEMENT LEFT HIP WOUND and poly exchange ;  Surgeon: Cummins Kay CHRISTELLA, MD;  Location: [facility];  Service: Orthopedics;  Laterality: Left;   HIP DEBRIDEMENT Left 01/19/2018   Procedure(s)  Performed: IRRIGATION AND DEBRIDEMENT LEFT HIP (Left )   INCISION AND DRAINAGE HIP Left 08/03/2016   Procedure: IRRIGATION AND DEBRIDEMENT LEFT HIP, VAC PLACEMENT;  Surgeon: Kay CHRISTELLA Cummins, MD;  Location: [facility];  Service: Orthopedics;  Laterality: Left;   INCISION AND DRAINAGE HIP Left 01/19/2018   Procedure: IRRIGATION AND DEBRIDEMENT LEFT HIP;  Surgeon: Cummins Kay CHRISTELLA, MD;  Location: [facility];  Service: Orthopedics;  Laterality: Left;   kidney stent Right 2002   TONSILLECTOMY Bilateral 12/06/2014   Procedure: TONSILLECTOMY, BILATERAL;  Surgeon: Merilee Kraft, MD;  Location: [facility] OR;  Service: ENT;  Laterality: Bilateral;   TONSILLECTOMY     TOTAL HIP ARTHROPLASTY Left 06/01/2016   Procedure: LEFT TOTAL HIP ARTHROPLASTY ANTERIOR APPROACH;  Surgeon: Kay CHRISTELLA Cummins, MD;  Location: [facility];  Service: Orthopedics;  Laterality: Left;   TUBAL LIGATION     WOUND EXPLORATION Left 01/19/2018   Procedure: WOUND EXPLORATION;  Surgeon: Cummins Kay CHRISTELLA, MD;  Location: [facility];  Service: Orthopedics;  Laterality: Left;   Family History  Problem Relation Age of Onset   Other Mother        failed surgery   Heart Problems Father        poss MI, not sure:per pt   Colon cancer Father  Colon cancer Maternal Uncle        not sure age of onset   Alzheimer's disease Maternal Grandmother    Heart disease Maternal Grandfather    Colon cancer Other        grandfather   Colon polyps Neg Hx    Esophageal cancer Neg Hx    Stomach cancer Neg Hx    Rectal cancer Neg Hx    Social History   Occupational History   Occupation: Not Working  Tobacco Use   Smoking status: Former    Current packs/day: 0.00    Average packs/day: 0.3 packs/day for 0.5 years (0.1 ttl pk-yrs)    Types: Cigarettes    Start date: 07/29/2013    Quit date: 01/27/2014    Years since quitting: 10.2   Smokeless tobacco: Never  Vaping Use   Vaping status: Never Used  Substance and Sexual Activity   Alcohol use: No   Drug use: No   Sexual activity: Not  Currently   Tobacco Counseling Counseling given: Not Answered  SDOH Screenings   Food Insecurity: No Food Insecurity (05/06/2024)  Housing: Low Risk  (05/06/2024)  Transportation Needs: No Transportation Needs (05/06/2024)  Utilities: Not At Risk (05/06/2024)  Alcohol Screen: Low Risk  (04/18/2023)  Depression (PHQ2-9): Low Risk  (05/06/2024)  Financial Resource Strain: Low Risk  (04/18/2023)  Physical Activity: Sufficiently Active (05/06/2024)  Social Connections: Moderately Isolated (05/06/2024)  Stress: No Stress Concern Present (05/06/2024)  Tobacco Use: Medium Risk (05/06/2024)  Health Literacy: Adequate Health Literacy (05/06/2024)   Depression Screen    05/06/2024    9:55 AM 01/10/2024   11:08 AM 08/18/2023    9:56 AM 05/22/2023    2:59 PM 04/18/2023    3:43 PM 04/04/2023   10:37 AM 02/13/2023    2:59 PM  PHQ 2/9 Scores  PHQ - 2 Score 0  3 3 4 3 3   PHQ- 9 Score 1  11 11 10 10 9   Exception Documentation  Patient refusal          Goals Addressed             This Visit's Progress    05/06/2024: Stay focus and in good health.         Visit info / Clinical Intake: Medicare Wellness Visit Type:: Subsequent Annual Wellness Visit Medicare Wellness Visit Mode:: Telephone If telephone:: video declined If telephone or video:: pt reported vitals Interpreter Needed?: No Pre-visit prep was completed: yes AWV questionnaire completed by patient prior to visit?: no Living arrangements:: lives with spouse/significant other Patient's Overall Health Status Rating: good Typical amount of pain: some Does pain affect daily life?: (!) yes Are you currently prescribed opioids?: no  Dietary Habits and Nutritional Risks How many meals a day?: 2 Eats fruit and vegetables daily?: yes Most meals are obtained by: preparing own meals In the last 2 weeks, have you had any of the following?: -- (NO) Diabetic:: (!) yes Any non-healing wounds?: (!) yes (BOIL ON LEFT HIP FOR 3-4 MONTHS) How often  do you check your BS?: -- (NONE) Would you like to be referred to a Nutritionist or for Diabetic Management? : no  Functional Status Activities of Daily Living (to include ambulation/medication): Independent Ambulation: Independent with device- listed below Home Assistive Devices/Equipment: Walker (specify Type); Other (Comment); Elevated toliet seat; Eyeglasses; Shower/tub chair (ROLLATOR) Medication Administration: Independent Home Management: Independent Manage your own finances?: yes Primary transportation is: driving Concerns about vision?: no *vision screening is required for WTM*  Concerns about hearing?: no  Fall Screening Falls in the past year?: 0 Number of falls in past year: 0 Was there an injury with Fall?: 0 Fall Risk Category Calculator: 0 Patient Fall Risk Level: Low Fall Risk  Fall Risk Patient at Risk for Falls Due to: No Fall Risks Fall risk Follow up: Falls evaluation completed  Home and Transportation Safety: All rugs have non-skid backing?: N/A, no rugs All stairs or steps have railings?: N/A, no stairs Grab bars in the bathtub or shower?: yes Have non-skid surface in bathtub or shower?: yes Good home lighting?: yes Regular seat belt use?: yes Hospital stays in the last year:: no  Cognitive Assessment Difficulty concentrating, remembering, or making decisions? : no Will 6CIT or Mini Cog be Completed: no 6CIT or Mini Cog Declined: patient alert, oriented, able to answer questions appropriately and recall recent events  Advance Directives (For Healthcare) Does Patient Have a Medical Advance Directive?: No Would patient like information on creating a medical advance directive?: No - Patient declined  Reviewed/Updated  Reviewed/Updated: All; Medical History; Surgical History; Family History; Medications; Allergies; Care Teams; Patient Goals        Objective:    Today's Vitals   05/06/24 0953  Weight: 221 lb (100.2 kg)  Height: 5' 2 (1.575 m)   PainSc: 0-No pain   Body mass index is 40.42 kg/m.  Current Medications (verified) Outpatient Encounter Medications as of 05/06/2024  Medication Sig   acetaminophen  (TYLENOL ) 650 MG CR tablet Take 1,300 mg by mouth every 8 (eight) hours as needed for pain.   amitriptyline  (ELAVIL ) 75 MG tablet Take 2 tablets (150 mg total) by mouth at bedtime.   Ascorbic Acid (VITAMIN C) 250 MG CHEW Chew 250 mg by mouth daily.   aspirin  EC 81 MG tablet Take 81 mg by mouth daily. Swallow whole.   cholecalciferol (VITAMIN D3) 25 MCG (1000 UNIT) tablet Take 1,000 Units by mouth daily.   doxycycline  (VIBRA -TABS) 100 MG tablet Take 1 tablet (100 mg total) by mouth 2 (two) times daily.   ferrous sulfate  325 (65 FE) MG EC tablet Take by mouth.   hydrochlorothiazide  (HYDRODIURIL ) 25 MG tablet Take 1 tablet (25 mg total) by mouth daily.   ibuprofen  (ADVIL ) 200 MG tablet Take 400 mg by mouth every 6 (six) hours as needed for moderate pain.   pantoprazole  (PROTONIX ) 40 MG tablet Take 1 tablet (40 mg total) by mouth daily.   polyethylene glycol powder (GLYCOLAX /MIRALAX ) 17 GM/SCOOP powder Take 1 Container by mouth once.   pregabalin  (LYRICA ) 75 MG capsule Take 75 mg by mouth 2 (two) times daily.   simvastatin  (ZOCOR ) 20 MG tablet Take 1 tablet (20 mg total) by mouth at bedtime.   tirzepatide  (MOUNJARO ) 15 MG/0.5ML Pen Inject 15 mg into the skin once a week.   traZODone  (DESYREL ) 50 MG tablet TAKE 1/2-1 TABLET AT BEDTIME as needed.   zinc  gluconate 50 MG tablet Take by mouth.   No facility-administered encounter medications on file as of 05/06/2024.   Hearing/Vision screen Hearing Screening - Comments:: Patient has adequate hearing. No hearing aids. Vision Screening - Comments:: Adequate vision with eyeglasses.  [facility] exam done by [facility] Immunizations and Health Maintenance Health Maintenance  Topic Date Due   Zoster Vaccines- Shingrix (1 of 2) Never done   FOOT EXAM  09/08/2017   OPHTHALMOLOGY  EXAM  10/05/2023   Influenza Vaccine  02/02/2024   COVID-19 Vaccine (3 - 2025-26 season) 03/04/2024   Colonoscopy  08/09/2024  HEMOGLOBIN A1C  07/12/2024   Diabetic kidney evaluation - Urine ACR  01/09/2025   Diabetic kidney evaluation - eGFR measurement  03/26/2025   Medicare Annual Wellness (AWV)  05/06/2025   Mammogram  02/14/2026   DTaP/Tdap/Td (6 - Td or Tdap) 10/29/2029   Pneumococcal Vaccine: 50+ Years  Completed   DEXA SCAN  Completed   Hepatitis C Screening  Completed   Meningococcal B Vaccine  Aged Out        Assessment/Plan:  This is a routine wellness examination for Kristin [name].  Patient Care Team: Lorrane Pac, MD as PCP - General (Family Medicine) [facility] as Consulting Physician [facility])  I have personally reviewed and noted the following in the patient's chart:   Medical and social history Use of alcohol, tobacco or illicit drugs  Current medications and supplements including opioid prescriptions. Functional ability and status Nutritional status Physical activity Advanced directives List of other physicians Hospitalizations, surgeries, and ER visits in previous 12 months Vitals Screenings to include cognitive, depression, and falls Referrals and appointments  No orders of the defined types were placed in this encounter.  In addition, I have reviewed and discussed with patient certain preventive protocols, quality metrics, and best practice recommendations. A written personalized care plan for preventive services as well as general preventive health recommendations were provided to patient.   Kristin LOISE Fuller, LPN   88/12/7972   Return in about 1 year (around 05/06/2025) for Medicare wellness.  After Visit Summary: (MyChart) Due to this being a telephonic visit, the after visit summary with patients personalized plan was offered to patient via MyChart   Nurse Notes: Patient due for Foot exam, Eye Exam, Colonoscopy  and Vaccines.

## 2024-05-09 ENCOUNTER — Ambulatory Visit: Admitting: Infectious Diseases

## 2024-05-13 ENCOUNTER — Other Ambulatory Visit: Payer: Self-pay

## 2024-05-14 ENCOUNTER — Other Ambulatory Visit: Payer: Self-pay

## 2024-05-14 NOTE — Telephone Encounter (Signed)
 Chart reviewed  -Fairy Amy, MD

## 2024-05-17 ENCOUNTER — Ambulatory Visit: Admitting: Family Medicine

## 2024-05-17 VITALS — BP 130/80 | HR 96 | Ht 62.0 in | Wt 223.2 lb

## 2024-05-17 DIAGNOSIS — Z23 Encounter for immunization: Secondary | ICD-10-CM

## 2024-05-17 DIAGNOSIS — M7542 Impingement syndrome of left shoulder: Secondary | ICD-10-CM | POA: Diagnosis not present

## 2024-05-17 NOTE — Patient Instructions (Addendum)
 It was great to see you! Thank you for allowing me to participate in your care!  Our plans for today:   VISIT SUMMARY: Today, we discussed your right arm and shoulder pain, which has been ongoing for over two weeks. The pain started after you began an exercise routine with weights and has not improved significantly despite rest and various treatments.  YOUR PLAN: RIGHT ARM AND SHOULDER PAIN: You have been experiencing pain in your right arm and shoulder for over two weeks, which started after you began an exercise routine with weights. The pain is burning and aching, localized to the front of your right shoulder, and worsens with certain movements. -Rest your shoulder and stop weightlifting for now. -Start physical therapy for your rotator cuff after one week of rest. -Apply Voltaren gel to the affected area three times daily for one week. -Consider a shoulder injection if the pain does not improve.  OSTEOARTHRITIS AND CHRONIC PAIN MANAGEMENT: You have a history of osteoarthritis in your shoulder and are currently taking medications for chronic pain. -Continue taking your current medications, amitriptyline  and pregabalin , as prescribed. -Avoid long-term use of NSAIDs like Aleve  due to potential risks.   Please arrive 15 minutes PRIOR to your next scheduled appointment time! If you do not, this affects OTHER patients' care.  Take care and seek immediate care sooner if you develop any concerns.   Ozell Provencal, MD, PGY-3 [facility] 11:19 AM 05/17/2024  [facility]

## 2024-05-17 NOTE — Progress Notes (Signed)
 SUBJECTIVE:   CHIEF COMPLAINT / HPI: radiating left arm pain  Discussed the use of AI scribe software for clinical note transcription with the patient, who gave verbal consent to proceed.  History of Present Illness  clavicle, AC joint, acromion, scapularDeborah L [name] is a 68 year old female with osteoarthritis who presents with right arm and shoulder pain.  Right arm and shoulder pain - Pain present for over two weeks, onset after initiating an exercise routine with weights - Burning and aching quality, localized to the anterior right shoulder - Pain persists at rest and is exacerbated by pulling back movements - Pain remains constant without significant improvement despite rest and modification of activity - No radiation from the neck - No numbness or tingling in the right arm - Perceived right-sided weakness during exercises, but able to lift the same weight as left side  Pain management and response to treatment - Uses muscle relaxers, possibly tizanidine, with temporary relief - Takes eight-hour Tylenol  and Aleve  for arthritis - Applies ice packs and heat pads for symptomatic relief - Despite stopping and restarting exercise routine, pain persists  Osteoarthritis and chronic pain management - History of osteoarthritis involving the shoulder - Current medications include amitriptyline  and pregabalin  for chronic pain    PERTINENT  PMH / PSH: Seizures, obesity, type 2 diabetes, hypertension, osteoarthritis  OBJECTIVE:   BP 130/80   Pulse 96   Ht 5' 2 (1.575 m)   Wt 223 lb 4 oz (101.3 kg)   SpO2 99%   BMI 40.83 kg/m   Physical Exam MUSCULOSKELETAL: Limited range of motion in shoulder abduction with pain. No pain on resisted shoulder adduction or abduction. Mild pain on resisted shoulder flexion. Able to reach behind back with shoulder. NEUROLOGICAL: Sensation intact in upper extremities. General: NAD, well appearing Neuro: A&O Respiratory: normal WOB on  RA Extremities: Moving all 4 extremities equally MSK: No obvious deformity, erythema, swelling of the left shoulder, no tenderness to palpation along the ridge or biceps tendon, full passive range of motion of the left shoulder and flexion, abduction, adduction, internal/external rotation, positive Hawkins test, positive painful arc, negative crossarm, negative speeds and Yergason's tests, grip strength 5 out of 5 bilaterally, biceps and triceps strength 5 out of 5 bilaterally, gross full range of motion of the neck, sensation intact bilateral upper extremities  Limited US  Shoulder POCUS: Mild hypoechoic changes surrounding the bicep tendon, degenerative changes of the humerus at the bicipital groove, large hypoechoic area anterior and medial to the biceps tendon, intact subscapularis, intact supraspinatus, hyperechoic changes within the supraspinatus tendon, intact infraspinatus, no obvious impingement with dynamic testing of the supraspinatus, degenerative changes of the [facility] joint Impression: Subacromial bursitis, supraspinatus calcific tendinopathy, AC joint osteoarthritis, glenohumeral osteoarthritis  ASSESSMENT/PLAN:   Assessment & Plan Subacromial impingement of left shoulder Clinically difficult to determine with acute injury versus exacerbation of osteoarthritis.  Definitively subacromial impingement present on exam, given reasonable strength doubt acute or partial tear of the rotator cuff.  Edema seen on ultrasound likely subacromial bursitis, no obvious indication of pectoralis major tear despite anterior location of the swelling.  No symptoms to suggest radiculopathic cause. - Advised rest and cessation of weightlifting. - Recommended physical therapy for rotator cuff after one week of rest. - Suggested Voltaren gel topically three times daily for one week. - Consider shoulder injection if pain persists. - Would get left shoulder and neck x-rays at next visit if no improvement -  Unfortunately patient already on amitriptyline ,  gabapentin , tizanidine as needed, Tylenol  and should not take NSAIDs - Home exercises provided Encounter for immunization Flu shot administered today    Return in about 4 weeks (around 06/14/2024).  Ozell Provencal, MD, PGY-3 [facility] 12:07 PM 05/17/2024  [facility]

## 2024-05-23 ENCOUNTER — Ambulatory Visit: Admitting: Infectious Diseases

## 2024-05-23 ENCOUNTER — Other Ambulatory Visit: Payer: Self-pay

## 2024-05-23 VITALS — BP 107/68 | HR 98 | Temp 98.0°F

## 2024-05-23 DIAGNOSIS — A4902 Methicillin resistant Staphylococcus aureus infection, unspecified site: Secondary | ICD-10-CM

## 2024-05-23 DIAGNOSIS — T8452XD Infection and inflammatory reaction due to internal left hip prosthesis, subsequent encounter: Secondary | ICD-10-CM

## 2024-05-23 DIAGNOSIS — T8452XA Infection and inflammatory reaction due to internal left hip prosthesis, initial encounter: Secondary | ICD-10-CM

## 2024-05-23 DIAGNOSIS — E119 Type 2 diabetes mellitus without complications: Secondary | ICD-10-CM

## 2024-05-23 DIAGNOSIS — Z5181 Encounter for therapeutic drug level monitoring: Secondary | ICD-10-CM

## 2024-05-23 DIAGNOSIS — Z7185 Encounter for immunization safety counseling: Secondary | ICD-10-CM | POA: Diagnosis not present

## 2024-05-23 MED ORDER — DOXYCYCLINE HYCLATE 100 MG PO TABS: 100.0000 mg | ORAL_TABLET | Freq: Two times a day (BID) | ORAL | 5 refills | Status: AC

## 2024-05-23 MED ORDER — DOXYCYCLINE HYCLATE 100 MG PO TABS: 100.0000 mg | ORAL_TABLET | Freq: Two times a day (BID) | ORAL | 5 refills | Status: DC

## 2024-05-23 NOTE — Progress Notes (Signed)
 Patient Active Problem List   Diagnosis Date Noted   MRSA infection 03/31/2024   Medication management 03/26/2024   Presence of left artificial hip joint 12/10/2021   Type 2 diabetes mellitus with obesity 12/09/2021   HTN (hypertension) 12/08/2021   Seizures (HCC) 11/26/2021   Hypercalcemia 10/24/2019   Body mass index 40.0-44.9, adult (HCC) 02/15/2018   Prosthetic joint infection of left hip 08/24/2016   Status post left hip replacement 06/01/2016   Sinus tachycardia 05/19/2016   Thyroid  nodule 12/25/2014   Infiltrate noted on imaging study    UNSPECIFIED RENAL SCLEROSIS 01/16/2009   GERD, SEVERE 05/25/2007   Morbid obesity (HCC) 04/03/2007   Dyslipidemia 08/31/2006   Former smoker 08/31/2006   HYPERTENSION, BENIGN SYSTEMIC 08/31/2006   CONVULSIONS, SEIZURES, NOS 08/31/2006   Current Outpatient Medications on File Prior to Visit  Medication Sig Dispense Refill   acetaminophen  (TYLENOL ) 650 MG CR tablet Take 1,300 mg by mouth every 8 (eight) hours as needed for pain.     amitriptyline  (ELAVIL ) 75 MG tablet Take 2 tablets (150 mg total) by mouth at bedtime. 62 tablet 1   Ascorbic Acid (VITAMIN C) 250 MG CHEW Chew 250 mg by mouth daily.     aspirin  EC 81 MG tablet Take 81 mg by mouth daily. Swallow whole.     cholecalciferol (VITAMIN D3) 25 MCG (1000 UNIT) tablet Take 1,000 Units by mouth daily.     doxycycline  (VIBRA -TABS) 100 MG tablet Take 1 tablet (100 mg total) by mouth 2 (two) times daily. 60 tablet 2   ferrous sulfate  325 (65 FE) MG EC tablet Take by mouth.     hydrochlorothiazide  (HYDRODIURIL ) 25 MG tablet TAKE ONE TABLET BY MOUTH EVERY DAY 31 tablet 2   ibuprofen  (ADVIL ) 200 MG tablet Take 400 mg by mouth every 6 (six) hours as needed for moderate pain.     MOUNJARO  15 MG/0.5ML Pen INJECT 1/2 (ONE-HALF) ML SUBCUTANEOUSLY  ONCE A WEEK 4 mL 0   pantoprazole  (PROTONIX ) 40 MG tablet Take 1 tablet (40 mg total) by mouth daily. 30 tablet 0   polyethylene glycol powder  (GLYCOLAX /MIRALAX ) 17 GM/SCOOP powder Take 1 Container by mouth once.     pregabalin  (LYRICA ) 75 MG capsule Take 75 mg by mouth 2 (two) times daily.     simvastatin  (ZOCOR ) 20 MG tablet TAKE ONE TABLET BY MOUTH AT BEDTIME 31 tablet 2   traZODone  (DESYREL ) 50 MG tablet TAKE 1/2-1 TABLET AT BEDTIME as needed. 30 tablet 2   zinc  gluconate 50 MG tablet Take by mouth.     No current facility-administered medications on file prior to visit.    Subjective: Discussed the use of AI scribe software for clinical note transcription with the patient, who gave verbal consent to proceed.   68 Y O Female with PMH as below including RA,  Left THA in 2017, Left hip PJI  I and D with head and liner exchange 07/25/2016 s/p treatment with Vancomycin  for 6 weeks then PO doxycycline  suppression ( cx MRSA), s/p I and D left hip in 08/03/2016, 01/19/2018, Left leg excisional debridement and wound closure 08/04/2019, Left hip excision arthroplasty with antibiotic spacers in 12/23/2019 (OR Cx with Prevotella bivia) s/p treatment with Vancomycin , rifampin , metronidazole  s/p 6 weeks course followed by PO doxycycline  stopped pn 10/13/21 for preparation of 2 stage reconstruction. She underwent  resection arthroplasty with spacer placement  12/08/21 s/p 6 weeks of IV Vancomycin , ceftriaxone and metronidazole  through 01/20/22 ( OR cx NG). Underwent  revision of left hip arthroplasty on 05/11/2022 ( OR cx NG) and started on post op doxycycline  briefly for some post op wound drainage then stopped, who is referred from Orthopedics for concerns of wound in Left hip.   She reports opening of wound in Left hip for approx 3 months spontaneously, initially measured the size of a fifty-cent piece. The wound drains yellowish pus and occasionally bleeds. Dressing changes have decreased from three times a day to once daily as the wound size has decreased in size.  She completed course of PO Vancomycin  yesterday and was taking 5 courses of 14 days  doxycycline  since  12/19/23 while following with Orthopedics as well as 1 14 days course of PO bactrim  prescribed 8/22. Aspiration of boil in left hip on 01/02/24 with no growth.   Denies fever, chills, nausea, vomiting, diarrhea, rashes,  cough, chest pain, or shortness of breath.  Interval events Seen by 10/7, plan for conservative management with continued antibiotics and plan to fu in 6 months. Accompanied by family member, reports compliance with PO doxycycline  with no missed doses or concerns. She reports left hip wound had closed but started opening back a couple of weeks ago with intermittent non purulent yellowish drainage. Denies left hip pain, fevers, chills, nausea, vomiting. Got flu vaccine.   Review of Systems: all systems reviewed with pertinent positives and negatives as listed above  Past Medical History:  Diagnosis Date   Allergy    seasonal   Anemia    Anxiety    Arthritis    Back pain, chronic    Borderline diabetes    Bronchitis    Constipation    current issue   Cough    Depression    Epilepsy (HCC)    GERD (gastroesophageal reflux disease)    Hyperlipidemia    Hypertension    IBS (irritable bowel syndrome)    Joint pain    Obesity    Palpitations    Pneumonia 2016   Pre-diabetes    Renal sclerosis, unspecified    only has 1 kidney   Rheumatoid arthritis (HCC)    Seizures (HCC)    last >15 +  yrs ago- recorded 07-26-2021   Somatization disorder    Tobacco abuse    Past Surgical History:  Procedure Laterality Date   ABDOMINAL HYSTERECTOMY     ANTERIOR HIP REVISION Left 07/25/2016   Procedure: LEFT HIP IRRIGATION AND DEBRIDEMENT, REVISION OF HEAD AND LINER;  Surgeon: Kay CHRISTELLA Cummins, MD;  Location: [facility] OR;  Service: Orthopedics;  Laterality: Left;   APPLICATION OF WOUND VAC Left 12/23/2019   Procedure: APPLICATION OF WOUND VAC;  Surgeon: Cummins Kay CHRISTELLA, MD;  Location: [facility] OR;  Service: Orthopedics;  Laterality: Left;   BREAST EXCISIONAL BIOPSY Right     CHOLECYSTECTOMY     COLONOSCOPY     DEBRIDEMENT AND CLOSURE WOUND Left 07/17/2019   Procedure: Excision of left leg wound with ACell placement and primary closure;  Surgeon: Lowery Estefana RAMAN, DO;  Location: [facility];  Service: Plastics;  Laterality: Left;  60 min   EXCISIONAL TOTAL HIP ARTHROPLASTY WITH ANTIBIOTIC SPACERS Left 12/23/2019   Procedure: LEFT HIP IRRIGATION AND DEBRIDEMENT LEFT HIP WOUND and poly exchange ;  Surgeon: Cummins Kay CHRISTELLA, MD;  Location: [facility] OR;  Service: Orthopedics;  Laterality: Left;   HIP DEBRIDEMENT Left 01/19/2018   Procedure(s) Performed: IRRIGATION AND DEBRIDEMENT LEFT HIP (Left )   INCISION AND DRAINAGE HIP Left 08/03/2016   Procedure: IRRIGATION  AND DEBRIDEMENT LEFT HIP, VAC PLACEMENT;  Surgeon: Kay CHRISTELLA Cummins, MD;  Location: [facility];  Service: Orthopedics;  Laterality: Left;   INCISION AND DRAINAGE HIP Left 01/19/2018   Procedure: IRRIGATION AND DEBRIDEMENT LEFT HIP;  Surgeon: Cummins Kay CHRISTELLA, MD;  Location: [facility] OR;  Service: Orthopedics;  Laterality: Left;   kidney stent Right 2002   TONSILLECTOMY Bilateral 12/06/2014   Procedure: TONSILLECTOMY, BILATERAL;  Surgeon: Merilee Kraft, MD;  [facility];  Service: ENT;  Laterality: Bilateral;   TONSILLECTOMY     TOTAL HIP ARTHROPLASTY Left 06/01/2016   Procedure: LEFT TOTAL HIP ARTHROPLASTY ANTERIOR APPROACH;  Surgeon: Kay CHRISTELLA Cummins, MD;  Location: [facility] OR;  Service: Orthopedics;  Laterality: Left;   TUBAL LIGATION     WOUND EXPLORATION Left 01/19/2018   Procedure: WOUND EXPLORATION;  Surgeon: Cummins Kay CHRISTELLA, MD;  Location: [facility] OR;  Service: Orthopedics;  Laterality: Left;    Social History   Tobacco Use   Smoking status: Former    Current packs/day: 0.00    Average packs/day: 0.3 packs/day for 0.5 years (0.1 ttl pk-yrs)    Types: Cigarettes    Start date: 07/29/2013    Quit date: 01/27/2014    Years since quitting: 10.3   Smokeless tobacco: Never  Vaping Use   Vaping status: Never Used  Substance  Use Topics   Alcohol use: No   Drug use: No    Family History  Problem Relation Age of Onset   Other Mother        failed surgery   Heart Problems Father        poss MI, not sure:per pt   Colon cancer Father    Colon cancer Maternal Uncle        not sure age of onset   Alzheimer's disease Maternal Grandmother    Heart disease Maternal Grandfather    Colon cancer Other        grandfather   Colon polyps Neg Hx    Esophageal cancer Neg Hx    Stomach cancer Neg Hx    Rectal cancer Neg Hx     Allergies  Allergen Reactions   Fluconazole  Swelling and Other (See Comments)    Face swelling   Robitussin (Alcohol Free) [Guaifenesin ] Palpitations and Other (See Comments)    Chest pain   Robitussin Dm Max Day-Night Palpitations    Health Maintenance  Topic Date Due   Zoster Vaccines- Shingrix (1 of 2) Never done   FOOT EXAM  09/08/2017   OPHTHALMOLOGY EXAM  10/05/2023   Colonoscopy  08/09/2024   COVID-19 Vaccine (3 - 2025-26 season) 06/02/2024 (Originally 03/04/2024)   HEMOGLOBIN A1C  07/12/2024   Diabetic kidney evaluation - Urine ACR  01/09/2025   Diabetic kidney evaluation - eGFR measurement  03/26/2025   Medicare Annual Wellness (AWV)  05/06/2025   Mammogram  02/14/2026   DTaP/Tdap/Td (6 - Td or Tdap) 10/29/2029   Pneumococcal Vaccine: 50+ Years  Completed   Influenza Vaccine  Completed   Bone Density Scan  Completed   Hepatitis C Screening  Completed   Meningococcal B Vaccine  Aged Out    Objective: BP 107/68   Pulse 98   Temp 98 F (36.7 C) (Oral)   SpO2 99%   Physical Exam Constitutional:      Appearance: Normal appearance. Morbidly obese  HENT:     Head: Normocephalic and atraumatic.      Mouth: Mucous membranes are moist.  Eyes:    Conjunctiva/sclera: Conjunctivae normal.  Pupils: Pupils are equal, round, and b/l symmetrical    Cardiovascular:     Rate and Rhythm: Normal rate    Heart sounds:  Pulmonary:     Effort: Pulmonary effort is normal.      Breath sounds:  Abdominal:     General: Non distended     Palpations:   Musculoskeletal:        General: sitting in the chair, Left hip opening with clear drainage, no signs of cellulitis    Skin:    General: Skin is warm and dry.     Comments:  Neurological:     General: grossly non focal     Mental Status: awake, alert and oriented to person, place, and time.   Psychiatric:        Mood and Affect: Mood normal.   Lab Results Lab Results  Component Value Date   WBC 5.4 03/26/2024   HGB 11.8 03/26/2024   HCT 37.5 03/26/2024   MCV 91.9 03/26/2024   PLT 302 03/26/2024    Lab Results  Component Value Date   CREATININE 1.06 (H) 03/26/2024   BUN 14 03/26/2024   NA 140 03/26/2024   K 3.8 03/26/2024   CL 99 03/26/2024   CO2 32 03/26/2024    Lab Results  Component Value Date   ALT 19 10/18/2021   AST 12 10/18/2021   ALKPHOS 111 10/18/2021   BILITOT <0.2 10/18/2021    Lab Results  Component Value Date   CHOL 127 01/10/2024   HDL 50 01/10/2024   LDLCALC 55 01/10/2024   TRIG 122 01/10/2024   CHOLHDL 2.5 01/10/2024   No results found for: LABRPR, RPRTITER No results found for: HIV1RNAQUANT, HIV1RNAVL, CD4TABS   Microbiology Results for orders placed or performed in visit on 01/02/24  Anaerobic and Aerobic Culture     Status: None   Collection Time: 01/02/24 11:37 AM   Specimen: Synovial, Left Hip; Synovial Fluid  Result Value Ref Range Status   MICRO NUMBER: [id number]  Final   SPECIMEN QUALITY: Adequate  Final   Source: NOT GIVEN  Final   STATUS: FINAL  Final   GRAM STAIN:   Final    Few White blood cells seen No epithelial cells seen No organisms seen   ANA RESULT: No anaerobes isolated.  Final   MICRO NUMBER: [id number]  Final   SPECIMEN QUALITY: Adequate  Final   SOURCE: NOT GIVEN  Final   STATUS: FINAL  Final   AER RESULT: No Growth  Final   COMMENT:   Final    No source was provided. The specimen was tested and reported based upon the  test code ordered. If this is incorrect, please contact client services.   *Note: Due to a large number of results and/or encounters for the requested time period, some results have not been displayed. A complete set of results can be found in Results Review.    Assessment/Plan # Chronic Left Hip PJI with sinus - Per last office note on 10/7, no plans for surgical intervention and recommended conservative management with prolonged PO doxycycline  - Adherence assessed, side effects reviewed/discussed and DDIs reviewed  - 9/23 CBC and BMP reviewed, Cr 1.06, CRP 11.5, ESR 48  Plan - continue PO doxycycline , likely related to prior MRSA infection, indefinitely if no plans for surgery  - ESR and CRP  - fu in 3 months   # DM - discussed optimal BG control for wound healing  # Morbid Obesity - will  benefit from weight loss  # RA  - not on tx  I spent 30 minutes involved in face-to-face and non-face-to-face activities for this patient on the day of the visit. Professional time spent includes the following activities: Preparing to see the patient (review of tests), Obtaining and reviewing separately obtained history (Ortho note 10/7, PCP note 11/14 PCP), Performing a medically appropriate examination and evaluation, Ordering medications/labs, Documenting clinical information in the EMR, Independently interpreting results (not separately reported), Communicating results to the patient, Counseling and educating the patient and Care coordination (not separately reported).   Of note, portions of this note may have been created with voice recognition software. While this note has been edited for accuracy, occasional wrong-word or 'sound-a-like' substitutions may have occurred due to the inherent limitations of voice recognition software.   Kristin Joseph, MD [facility] 05/23/2024, 11:26 AM

## 2024-05-24 DIAGNOSIS — Z5181 Encounter for therapeutic drug level monitoring: Secondary | ICD-10-CM | POA: Insufficient documentation

## 2024-05-24 DIAGNOSIS — Z7185 Encounter for immunization safety counseling: Secondary | ICD-10-CM | POA: Insufficient documentation

## 2024-05-24 LAB — C-REACTIVE PROTEIN: CRP: 4.8 mg/L (ref <8.0)

## 2024-05-24 LAB — SEDIMENTATION RATE: Sed Rate: 46 mm/h — ABNORMAL HIGH (ref 0–30)

## 2024-05-25 ENCOUNTER — Ambulatory Visit: Payer: Self-pay | Admitting: Infectious Diseases

## 2024-05-25 ENCOUNTER — Encounter (HOSPITAL_COMMUNITY): Payer: Self-pay | Admitting: Emergency Medicine

## 2024-05-25 ENCOUNTER — Ambulatory Visit (HOSPITAL_COMMUNITY): Admission: EM | Admit: 2024-05-25 | Discharge: 2024-05-25 | Disposition: A | Discharge status: Home / Self Care

## 2024-05-25 DIAGNOSIS — M25512 Pain in left shoulder: Secondary | ICD-10-CM

## 2024-05-25 NOTE — Discharge Instructions (Addendum)
 [name] has an urgent care that is open today until 3 PM.  Please set there for further evaluation of your shoulder pain, they have the ability to do steroid joint injections if needed.

## 2024-05-25 NOTE — ED Provider Notes (Signed)
 Patient presents to clinic over concern of continued left shoulder pain for the past 3 weeks.  Did have a visit with her PCP on 11/14 where she had an ultrasound and was diagnosed with subacromial impingement of left shoulder.  Patient has tried Voltaren gel and Tylenol  without any improvement.  At this visit there was discussion of steroid joint injection if no improvement.  Patient presents to clinic requesting joint injection.  Discussed that this is not routinely done through our urgent care.  Encouraged orthopedic urgent care follow-up for further evaluation, as they have the ability for steroid joint injections.  Patient agreeable to plan and will head to [facility] urgent care off [address].   Dreama Leonetta SAILOR, FNP 05/25/24 [id number]

## 2024-05-25 NOTE — ED Triage Notes (Signed)
 Pt reports having left shoulder and arm pain for 3 weeks. Reports that she saw provider about it earlier this week and told if not better in a month then come back for steroid shot. Pt reports that she is hurting and wants a steroid shot today.  Pt denies of any known injury. Reports is working out and lifting weights, so doesn't know if pain from that or not.

## 2024-05-27 ENCOUNTER — Ambulatory Visit

## 2024-05-27 VITALS — BP 119/71 | HR 104 | Ht 62.0 in | Wt 222.0 lb

## 2024-05-27 DIAGNOSIS — M25512 Pain in left shoulder: Secondary | ICD-10-CM

## 2024-05-27 DIAGNOSIS — M7541 Impingement syndrome of right shoulder: Secondary | ICD-10-CM | POA: Diagnosis not present

## 2024-05-27 MED ORDER — OXYCODONE HCL 5 MG PO TABS: 5.0000 mg | ORAL_TABLET | ORAL | 0 refills | Status: AC | PRN

## 2024-05-27 NOTE — Patient Instructions (Signed)
 It was good to see you today.   Please bring ALL of your medications with you to every visit.    Today we talked about: L Shoulder pain    Thank you for choosing [facility]. Please refer to your mychart for specifics regarding today's visit or future appointments.

## 2024-05-27 NOTE — Progress Notes (Signed)
    SUBJECTIVE:   CHIEF COMPLAINT / HPI:   L Shoulder Pain - 3 weeks pain down the bicep. Worse with behind and overhead motions.  - Seen by PCP on 11/14 with US  findings suggestive of SA impingement.  - Having some tingling in the arm as well. Not going into fingers.  - heat pad, ice, tylenol , NSAID, lyrica , topical voltaren not helping - No hx injury. Pending PT appts.   PERTINENT  PMH / PSH: Left hip joint infection  OBJECTIVE:   BP 119/71   Pulse (!) 104   Ht 5' 2 (1.575 m)   Wt 222 lb (100.7 kg)   SpO2 100%   BMI 40.60 kg/m    Physical Exam General: Alert, conversant, cooperative. No acute distress.  HEENT: PERRL. EOMI. MMM.  Respiratory:Normal work of breathing. Musculoskeletal: TTP left trap, biceps, AC joint. Positive hawkins and neer. Pain with IR. Strength 4/5.  Skin: Warm. Dry. No rashes. No icterus.  Neurologic: No focal deficits. Moving all extremities. Psychiatric: Cooperative. Appropriate mood. Appropriate affect.   ASSESSMENT/PLAN:   Assessment & Plan Rotator cuff impingement syndrome of right shoulder Pt requesting steroid injection. Pt has hx of left hip replacement with subsequent joint infection, requiring multiple weeks of IV abx and reconstruction. Currently on suppressive abx. Given her higher risk status, will refer back to ortho for injection and continued management. She has PT scheduled. Will give short course of oxycodone  for pain, as she has maxed out other non-opioid modalities. Xrays of shoulder and neck ordered.  Acute pain of left shoulder As above      Milda LITTIE Deed, MD [facility]

## 2024-06-05 ENCOUNTER — Ambulatory Visit

## 2024-06-06 ENCOUNTER — Ambulatory Visit: Admitting: Orthopaedic Surgery

## 2024-06-06 ENCOUNTER — Other Ambulatory Visit: Payer: Self-pay

## 2024-06-06 DIAGNOSIS — G8929 Other chronic pain: Secondary | ICD-10-CM

## 2024-06-06 MED ORDER — METHYLPREDNISOLONE ACETATE 40 MG/ML IJ SUSP
40.0000 mg | INTRAMUSCULAR | Status: AC | PRN
  Administered 2024-06-06: 40 mg via INTRA_ARTICULAR

## 2024-06-06 MED ORDER — LIDOCAINE HCL 1 % IJ SOLN
3.0000 mL | INTRAMUSCULAR | Status: AC | PRN
  Administered 2024-06-06: 3 mL

## 2024-06-06 MED ORDER — BUPIVACAINE HCL 0.5 % IJ SOLN
3.0000 mL | INTRAMUSCULAR | Status: AC | PRN
  Administered 2024-06-06: 3 mL via INTRA_ARTICULAR

## 2024-06-06 NOTE — Progress Notes (Signed)
 Office Visit Note   Patient: Kristin [name]           Date of Birth: 1955-11-18           MRN: [id number] Visit Date: 06/06/2024              Requested by: Orie Milda LITTIE, MD [address] [place],  [place] [address] PCP: Lorrane Pac, MD   Assessment & Plan: Visit Diagnoses:  1. Chronic left shoulder pain     Plan: 68 year old female with left shoulder pain probable supraspinatus tendinosis and impingement.  Treatment options discussed and she would like to undergo steroid injection.  We performed a glenohumeral and subacromial injection today.  Tolerated both injections well.  Follow-up as needed.  Follow-Up Instructions: No follow-ups on file.   Orders:  Orders Placed This Encounter  Procedures   XR Shoulder Left   No orders of the defined types were placed in this encounter.     Procedures: Large Joint Inj: L subacromial bursa on 06/06/2024 8:21 PM Indications: pain Details: 22 G needle  Arthrogram: No  Medications: 3 mL lidocaine  1 %; 3 mL bupivacaine  0.5 %; 40 mg methylPREDNISolone  acetate 40 MG/ML Outcome: tolerated well, no immediate complications Patient was prepped and draped in the usual sterile fashion.    Large Joint Inj: L glenohumeral on 06/06/2024 8:21 PM Indications: pain Details: 22 G needle  Arthrogram: No  Medications: 3 mL lidocaine  1 %; 3 mL bupivacaine  0.5 %; 40 mg methylPREDNISolone  acetate 40 MG/ML Outcome: tolerated well, no immediate complications Patient was prepped and draped in the usual sterile fashion.       Clinical Data: No additional findings.   Subjective: Chief Complaint  Patient presents with   Left Shoulder - Pain    HPI Kristin [name] is a 68 year old female well-known to me comes in for a 1 month history of left shoulder pain.  Thinks it started with exercising with weights.  Has pain that radiates from the shoulder down the lateral deltoid with some occasional tingling.  Denies any radicular symptoms. Review  of Systems  Constitutional: Negative.   HENT: Negative.    Eyes: Negative.   Respiratory: Negative.    Cardiovascular: Negative.   Endocrine: Negative.   Musculoskeletal: Negative.   Neurological: Negative.   Hematological: Negative.   Psychiatric/Behavioral: Negative.    All other systems reviewed and are negative.    Objective: Vital Signs: There were no vitals taken for this visit.  Physical Exam Vitals and nursing note reviewed.  Constitutional:      Appearance: She is well-developed.  HENT:     Head: Atraumatic.     Nose: Nose normal.  Eyes:     Extraocular Movements: Extraocular movements intact.  Cardiovascular:     Pulses: Normal pulses.  Pulmonary:     Effort: Pulmonary effort is normal.  Abdominal:     Palpations: Abdomen is soft.  Musculoskeletal:     Cervical back: Neck supple.  Skin:    General: Skin is warm.     Capillary Refill: Capillary refill takes less than 2 seconds.  Neurological:     Mental Status: She is alert. Mental status is at baseline.  Psychiatric:        Behavior: Behavior normal.        Thought Content: Thought content normal.        Judgment: Judgment normal.     Ortho Exam Exam of the left shoulder shows excellent range of motion.  Slight pain  to empty can test without any weakness.  Slight pain with Hawkins impingement sign. Specialty Comments:  No specialty comments available.  Imaging: XR Shoulder Left Result Date: 06/06/2024 X-rays of the left shoulder show degenerative spurring of the acromion and AC joint.  Slight arthritic changes of the glenohumeral joint.  No acute abnormalities.    PMFS History: Patient Active Problem List   Diagnosis Date Noted   Medication monitoring encounter 05/24/2024   Immunization counseling 05/24/2024   MRSA infection 03/31/2024   Medication management 03/26/2024   Presence of left artificial hip joint 12/10/2021   Type 2 diabetes mellitus with obesity 12/09/2021   HTN (hypertension)  12/08/2021   Seizures (HCC) 11/26/2021   Hypercalcemia 10/24/2019   Body mass index 40.0-44.9, adult (HCC) 02/15/2018   Prosthetic joint infection of left hip 08/24/2016   Status post left hip replacement 06/01/2016   Sinus tachycardia 05/19/2016   Thyroid  nodule 12/25/2014   Infiltrate noted on imaging study    UNSPECIFIED RENAL SCLEROSIS 01/16/2009   GERD, SEVERE 05/25/2007   Morbid obesity (HCC) 04/03/2007   Dyslipidemia 08/31/2006   Former smoker 08/31/2006   HYPERTENSION, BENIGN SYSTEMIC 08/31/2006   CONVULSIONS, SEIZURES, NOS 08/31/2006   Past Medical History:  Diagnosis Date   Allergy    seasonal   Anemia    Anxiety    Arthritis    Back pain, chronic    Borderline diabetes    Bronchitis    Constipation    current issue   Cough    Depression    Epilepsy (HCC)    GERD (gastroesophageal reflux disease)    Hyperlipidemia    Hypertension    IBS (irritable bowel syndrome)    Joint pain    Obesity    Palpitations    Pneumonia 2016   Pre-diabetes    Renal sclerosis, unspecified    only has 1 kidney   Rheumatoid arthritis (HCC)    Seizures (HCC)    last >15 +  yrs ago- recorded 07-26-2021   Somatization disorder    Tobacco abuse     Family History  Problem Relation Age of Onset   Other Mother        failed surgery   Heart Problems Father        poss MI, not sure:per pt   Colon cancer Father    Colon cancer Maternal Uncle        not sure age of onset   Alzheimer's disease Maternal Grandmother    Heart disease Maternal Grandfather    Colon cancer Other        grandfather   Colon polyps Neg Hx    Esophageal cancer Neg Hx    Stomach cancer Neg Hx    Rectal cancer Neg Hx     Past Surgical History:  Procedure Laterality Date   ABDOMINAL HYSTERECTOMY     ANTERIOR HIP REVISION Left 07/25/2016   Procedure: LEFT HIP IRRIGATION AND DEBRIDEMENT, REVISION OF HEAD AND LINER;  Surgeon: Kay CHRISTELLA Cummins, MD;  Location: [facility] OR;  Service: Orthopedics;  Laterality: Left;    APPLICATION OF WOUND VAC Left 12/23/2019   Procedure: APPLICATION OF WOUND VAC;  Surgeon: Cummins Kay CHRISTELLA, MD;  Location: [facility] OR;  Service: Orthopedics;  Laterality: Left;   BREAST EXCISIONAL BIOPSY Right    CHOLECYSTECTOMY     COLONOSCOPY     DEBRIDEMENT AND CLOSURE WOUND Left 07/17/2019   Procedure: Excision of left leg wound with ACell placement and primary closure;  Surgeon: Lowery,  Estefana RAMAN, DO;  Location: [facility];  Service: Plastics;  Laterality: Left;  60 min   EXCISIONAL TOTAL HIP ARTHROPLASTY WITH ANTIBIOTIC SPACERS Left 12/23/2019   Procedure: LEFT HIP IRRIGATION AND DEBRIDEMENT LEFT HIP WOUND and poly exchange ;  Surgeon: Jerri Kay HERO, MD;  Location: [facility] OR;  Service: Orthopedics;  Laterality: Left;   HIP DEBRIDEMENT Left 01/19/2018   Procedure(s) Performed: IRRIGATION AND DEBRIDEMENT LEFT HIP (Left )   INCISION AND DRAINAGE HIP Left 08/03/2016   Procedure: IRRIGATION AND DEBRIDEMENT LEFT HIP, VAC PLACEMENT;  Surgeon: Kay HERO Jerri, MD;  Location: [facility];  Service: Orthopedics;  Laterality: Left;   INCISION AND DRAINAGE HIP Left 01/19/2018   Procedure: IRRIGATION AND DEBRIDEMENT LEFT HIP;  Surgeon: Jerri Kay HERO, MD;  Location: [facility];  Service: Orthopedics;  Laterality: Left;   kidney stent Right 2002   TONSILLECTOMY Bilateral 12/06/2014   Procedure: TONSILLECTOMY, BILATERAL;  Surgeon: Merilee Kraft, MD;  Location: [facility] OR;  Service: ENT;  Laterality: Bilateral;   TONSILLECTOMY     TOTAL HIP ARTHROPLASTY Left 06/01/2016   Procedure: LEFT TOTAL HIP ARTHROPLASTY ANTERIOR APPROACH;  Surgeon: Kay HERO Jerri, MD;  Location: [facility];  Service: Orthopedics;  Laterality: Left;   TUBAL LIGATION     WOUND EXPLORATION Left 01/19/2018   Procedure: WOUND EXPLORATION;  Surgeon: Jerri Kay HERO, MD;  Location: [facility];  Service: Orthopedics;  Laterality: Left;   Social History   Occupational History   Occupation: Not Working  Tobacco Use   Smoking status: Former    Current packs/day: 0.00     Average packs/day: 0.3 packs/day for 0.5 years (0.1 ttl pk-yrs)    Types: Cigarettes    Start date: 07/29/2013    Quit date: 01/27/2014    Years since quitting: 10.3   Smokeless tobacco: Never  Vaping Use   Vaping status: Never Used  Substance and Sexual Activity   Alcohol use: No   Drug use: No   Sexual activity: Not Currently

## 2024-06-10 ENCOUNTER — Other Ambulatory Visit: Payer: Self-pay

## 2024-06-11 ENCOUNTER — Other Ambulatory Visit: Payer: Self-pay

## 2024-06-11 NOTE — Telephone Encounter (Signed)
 Chart reviewed, appt scheduled 12/16 with Dr. Alba  -Fairy Amy, MD

## 2024-06-18 ENCOUNTER — Ambulatory Visit: Admitting: Family Medicine

## 2024-06-18 ENCOUNTER — Encounter: Payer: Self-pay | Admitting: Family Medicine

## 2024-06-18 VITALS — BP 120/66 | HR 88 | Temp 98.3°F | Ht 62.0 in | Wt 224.1 lb

## 2024-06-18 DIAGNOSIS — E119 Type 2 diabetes mellitus without complications: Secondary | ICD-10-CM | POA: Diagnosis not present

## 2024-06-18 DIAGNOSIS — L249 Irritant contact dermatitis, unspecified cause: Secondary | ICD-10-CM

## 2024-06-18 NOTE — Progress Notes (Signed)
° ° °  SUBJECTIVE:   CHIEF COMPLAINT / HPI: a1c  Discussed the use of AI scribe software for clinical note transcription with the patient, who gave verbal consent to proceed.  History of Present Illness Kristin [name] is a 68 year old female who presents for a follow-up visit.  Diabetes mellitus management - Uses Mounjaro  15 mg once weekly for glycemic control. - Not currently taking metformin  or Jardiance.  Dermatologic symptoms of lower extremity - Pruritus and erythema around the knee for approximately one week. - Area of involvement is spreading. - Small lump present at the lower edge of the affected area. - Skin is warm to touch but not painful. - Intense itching persists. - Alcohol, lotion, and cortisone cream have not provided relief. - History of hip surgery on the same leg. - Leg swelling present. - Compression socks are available for use.    PERTINENT  PMH / PSH: Hypertension, GERD, 2 diabetes history of prosthetic joint infection, morbid obesity, dyslipidemia, former smoker, history of seizures  OBJECTIVE:   BP 120/66   Pulse 88   Temp 98.3 F (36.8 C) (Oral)   Ht 5' 2 (1.575 m)   Wt 224 lb 2 oz (101.7 kg)   SpO2 100%   BMI 40.99 kg/m   Physical Exam General: NAD, well appearing Neuro: A&O Respiratory: normal WOB on RA Extremities: Moving all 4 extremities equally Left leg: Mild erythema over the anterior shin, mild hyperpigmentation in comparison to the right leg, trace peripheral edema Bilateral feet: Dorsalis pedis pulses intact bilaterally, intact sensation with touch and monofilament testing    ASSESSMENT/PLAN:   Assessment & Plan Type 2 diabetes mellitus without complication, without long-term current use of insulin  (HCC) Last A1c 5.5. Last creatinine 1.13.  If persistently elevated would meet diagnostic criteria for CKD3.  - Ordered A1c, BMP - Continue Mounjaro  15 mg weekly. - If creatinine continues to be elevated, follow-up earlier for  initiation of renally protective medications ARB/SGLT2i. Irritant contact dermatitis of lower leg Suspect patient had mild xerosis causing pruritus and that has been exacerbated now by application of alcohol.  There may be component of mild venous stasis dermatitis. - Discontinued alcohol use on the skin. - Use Aquaphor, Eucerin, or Vaseline on itchy areas twice daily, especially after bathing. - Wear compression stockings when up and about. - Consider Zyrtec  for itching. - Monitor for increased redness, swelling, or pain; if symptoms worsen, will consider antibiotics.    Return if symptoms worsen or fail to improve.  Ozell Provencal, MD, PGY-3 [facility] 1:41 PM 06/18/2024  [facility]

## 2024-06-18 NOTE — Patient Instructions (Signed)
 It was great to see you! Thank you for allowing me to participate in your care!  Our plans for today:   VISIT SUMMARY: You had a follow-up visit to address your shoulder pain, diabetes management, and skin issues on your lower leg.  YOUR PLAN:  TYPE 2 DIABETES MELLITUS: Your diabetes is currently managed with Mounjaro  15 mg weekly. You are not taking metformin  or Jardiance at this time. -Continue taking Mounjaro  15 mg once a week. -We have ordered tests for A1c, kidney function, and electrolytes. Please complete these tests as soon as possible.  DERMATITIS WITH LOCALIZED EDEMA OF LEG: You have itching and redness around your knee, with a small lump. The area is warm but not painful, and the symptoms have been present for about a week. -Stop using alcohol on your skin as it may worsen dryness. -Apply Aquaphor, Eucerin, or Vaseline to the itchy areas twice daily, especially after bathing. -Wear compression stockings when you are up and about to help with the swelling. -Consider taking Zyrtec  to help with the itching. -Monitor the area for increased redness, swelling, or pain. If symptoms worsen, contact us  as you may need antibiotics.     Please arrive 15 minutes PRIOR to your next scheduled appointment time! If you do not, this affects OTHER patients' care.  Take care and seek immediate care sooner if you develop any concerns.   Ozell Provencal, MD, PGY-3 [facility] 11:57 AM 06/18/2024  [facility]

## 2024-06-19 ENCOUNTER — Ambulatory Visit: Payer: Self-pay | Admitting: Family Medicine

## 2024-06-19 ENCOUNTER — Ambulatory Visit

## 2024-06-19 LAB — BASIC METABOLIC PANEL WITH GFR
BUN/Creatinine Ratio: 16 (ref 12–28)
BUN: 16 mg/dL (ref 8–27)
CO2: 29 mmol/L (ref 20–29)
Calcium: 10.1 mg/dL (ref 8.7–10.3)
Chloride: 101 mmol/L (ref 96–106)
Creatinine, Ser: 1 mg/dL (ref 0.57–1.00)
Glucose: 79 mg/dL (ref 70–99)
Potassium: 4 mmol/L (ref 3.5–5.2)
Sodium: 141 mmol/L (ref 134–144)
eGFR: 62 mL/min/1.73 (ref >59)

## 2024-06-19 LAB — HEMOGLOBIN A1C
Est. average glucose Bld gHb Est-mCnc: 117 mg/dL
Hgb A1c MFr Bld: 5.7 % — ABNORMAL HIGH (ref 4.8–5.6)

## 2024-06-20 ENCOUNTER — Ambulatory Visit

## 2024-06-20 ENCOUNTER — Other Ambulatory Visit: Payer: Self-pay

## 2024-06-20 DIAGNOSIS — M25512 Pain in left shoulder: Secondary | ICD-10-CM | POA: Diagnosis present

## 2024-06-20 DIAGNOSIS — G8929 Other chronic pain: Secondary | ICD-10-CM

## 2024-06-20 NOTE — Therapy (Signed)
 " OUTPATIENT PHYSICAL THERAPY SHOULDER EVALUATION   Patient Name: Kristin [name] MRN: [id number] DOB:[birth year], 68 y.o., female Today's Date: 06/20/2024  END OF SESSION:   PT End of Session - 06/20/24 1450     Visit Number 1    Number of Visits 16    Date for Recertification  08/15/24    Authorization Type [employer] MCR/MCD    Authorization Time Period VL- medical necessity    PT Start Time 1448    PT Stop Time 1525    PT Time Calculation (min) 37 min           Past Medical History:  Diagnosis Date   Allergy    seasonal   Anemia    Anxiety    Arthritis    Back pain, chronic    Borderline diabetes    Bronchitis    Constipation    current issue   Cough    Depression    Epilepsy (HCC)    GERD (gastroesophageal reflux disease)    Hyperlipidemia    Hypertension    IBS (irritable bowel syndrome)    Joint pain    Obesity    Palpitations    Pneumonia 2016   Pre-diabetes    Renal sclerosis, unspecified    only has 1 kidney   Rheumatoid arthritis (HCC)    Seizures (HCC)    last >15 +  yrs ago- recorded 07-26-2021   Somatization disorder    Tobacco abuse    Past Surgical History:  Procedure Laterality Date   ABDOMINAL HYSTERECTOMY     ANTERIOR HIP REVISION Left 07/25/2016   Procedure: LEFT HIP IRRIGATION AND DEBRIDEMENT, REVISION OF HEAD AND LINER;  Surgeon: Kay CHRISTELLA Cummins, MD;  Location: [facility];  Service: Orthopedics;  Laterality: Left;   APPLICATION OF WOUND VAC Left 12/23/2019   Procedure: APPLICATION OF WOUND VAC;  Surgeon: Cummins Kay CHRISTELLA, MD;  Location: [facility];  Service: Orthopedics;  Laterality: Left;   BREAST EXCISIONAL BIOPSY Right    CHOLECYSTECTOMY     COLONOSCOPY     DEBRIDEMENT AND CLOSURE WOUND Left 07/17/2019   Procedure: Excision of left leg wound with ACell placement and primary closure;  Surgeon: Lowery Estefana RAMAN, DO;  Location: [facility];  Service: Plastics;  Laterality: Left;  60 min   EXCISIONAL TOTAL HIP ARTHROPLASTY WITH  ANTIBIOTIC SPACERS Left 12/23/2019   Procedure: LEFT HIP IRRIGATION AND DEBRIDEMENT LEFT HIP WOUND and poly exchange ;  Surgeon: Cummins Kay CHRISTELLA, MD;  Location: [facility];  Service: Orthopedics;  Laterality: Left;   HIP DEBRIDEMENT Left 01/19/2018   Procedure(s) Performed: IRRIGATION AND DEBRIDEMENT LEFT HIP (Left )   INCISION AND DRAINAGE HIP Left 08/03/2016   Procedure: IRRIGATION AND DEBRIDEMENT LEFT HIP, VAC PLACEMENT;  Surgeon: Kay CHRISTELLA Cummins, MD;  Location: [facility];  Service: Orthopedics;  Laterality: Left;   INCISION AND DRAINAGE HIP Left 01/19/2018   Procedure: IRRIGATION AND DEBRIDEMENT LEFT HIP;  Surgeon: Cummins Kay CHRISTELLA, MD;  Location: [facility];  Service: Orthopedics;  Laterality: Left;   kidney stent Right 2002   TONSILLECTOMY Bilateral 12/06/2014   Procedure: TONSILLECTOMY, BILATERAL;  Surgeon: Merilee Kraft, MD;  Location: [facility] OR;  Service: ENT;  Laterality: Bilateral;   TONSILLECTOMY     TOTAL HIP ARTHROPLASTY Left 06/01/2016   Procedure: LEFT TOTAL HIP ARTHROPLASTY ANTERIOR APPROACH;  Surgeon: Kay CHRISTELLA Cummins, MD;  Location: [facility];  Service: Orthopedics;  Laterality: Left;   TUBAL LIGATION     WOUND EXPLORATION Left  01/19/2018   Procedure: WOUND EXPLORATION;  Surgeon: Jerri Kay HERO, MD;  Location: [facility];  Service: Orthopedics;  Laterality: Left;   Patient Active Problem List   Diagnosis Date Noted   Medication monitoring encounter 05/24/2024   Immunization counseling 05/24/2024   MRSA infection 03/31/2024   Medication management 03/26/2024   Presence of left artificial hip joint 12/10/2021   Type 2 diabetes mellitus with obesity 12/09/2021   HTN (hypertension) 12/08/2021   Seizures (HCC) 11/26/2021   Hypercalcemia 10/24/2019   Body mass index 40.0-44.9, adult (HCC) 02/15/2018   Prosthetic joint infection of left hip 08/24/2016   Status post left hip replacement 06/01/2016   Sinus tachycardia 05/19/2016   Thyroid  nodule 12/25/2014   Infiltrate noted on imaging study    UNSPECIFIED RENAL  SCLEROSIS 01/16/2009   GERD, SEVERE 05/25/2007   Morbid obesity (HCC) 04/03/2007   Dyslipidemia 08/31/2006   Former smoker 08/31/2006   HYPERTENSION, BENIGN SYSTEMIC 08/31/2006   CONVULSIONS, SEIZURES, NOS 08/31/2006    PCP: Lorrane Pac, MD   REFERRING PROVIDER:  Donah Laymon PARAS, MD    REFERRING DIAG:  [phone] (ICD-10-CM) - Subacromial impingement of left shoulder  Rotator cuff strengthening     THERAPY DIAG:  Left shoulder pain, unspecified chronicity  Rationale for Evaluation and Treatment: Rehabilitation  ONSET DATE: 05/04/2024  SUBJECTIVE:                                                                                                                                                                                      SUBJECTIVE STATEMENT: She confirms that her Left shoulder began hurting after beginning a weight training program. She did have a cortisone shot from Dr. Jerri, 2 weeks ago, which has been helpful. She states that she was told to take a break from exercising. She would like to return to an exercise routine for her weight loss goals.  Hand dominance: Right  PERTINENT HISTORY: hx of OA, seizures/epilepsy, obesity, type 2 DM, HTN, anemia, depression, RA  PAIN:  Are you having pain?  Yes: NPRS scale: 5/10 current, 10/10 worst Pain location: L shoulder and L UE Pain description: shooting, tingling Aggravating factors: OH lifting, lifting something heavy  Relieving factors: cortisone injection, heat pad,   PRECAUTIONS: None  RED FLAGS: None   WEIGHT BEARING RESTRICTIONS: No  FALLS:  Has patient fallen in last 6 months? No  LIVING ENVIRONMENT: Lives with: lives with their spouse Lives in: House/apartment Stairs: No Has following equipment at home: None  OCCUPATION: N/a  PLOF: Independent with household mobility with device, Independent with community mobility with device, and Leisure: crafting  PATIENT GOALS:able to move my arm without  pain, learning better way to exercise   NEXT MD VISIT:   OBJECTIVE:  Note: Objective measures were completed at Evaluation unless otherwise noted.  DIAGNOSTIC FINDINGS:  06/06/24: L Shoulder X-Ray   X-rays of the left shoulder show degenerative spurring of the acromion and  AC joint.  Slight arthritic changes of the glenohumeral joint.  No acute  abnormalities.   PATIENT SURVEYS:  Quick Dash:  QUICK DASH  Please rate your ability do the following activities in the last week by selecting the number below the appropriate response.   Activities Rating  Open a tight or new jar.  3 = Moderate difficulty  Do heavy household chores (e.g., wash walls, floors). 5 = Unable  Carry a shopping bag or briefcase 2 = Mild difficulty  Wash your back. 5 = Unable  Use a knife to cut food. 2 = Mild difficulty  Recreational activities in which you take some force or impact through your arm, shoulder or hand (e.g., golf, hammering, tennis, etc.). 3 = Moderate difficulty  During the past week, to what extent has your arm, shoulder or hand problem interfered with your normal social activities with family, friends, neighbors or groups?  3 = Moderately  During the past week, were you limited in your work or other regular daily activities as a result of your arm, shoulder or hand problem? 2 = Slightly limited  Rate the severity of the following symptoms in the last week: Arm, Shoulder, or hand pain. 3 = Moderate  Rate the severity of the following symptoms in the last week: Tingling (pins and needles) in your arm, shoulder or hand. 3 = Moderate  During the past week, how much difficulty have you had sleeping because of the pain in your arm, shoulder or hand?  3 = Moderate difficulty   (A QuickDASH score may not be calculated if there is greater than 1 missing item.)  Quick Dash Disability/Symptom Score: 27.3  Minimally Clinically Important Difference (MCID): 15-20 points  Flavio, F. et al. (2013).  Minimally clinically important difference of the disabilities of the arm, shoulder, and hand outcome measures (DASH) and its shortened version (Quick DASH). Journal of Orthopaedic & Sports Physical Therapy, 44(1), 30-39)   COGNITION: Overall cognitive status: Within functional limits for tasks assessed     SENSATION: Not tested  POSTURE: Rounded shoulder, forward head  UPPER EXTREMITY ROM:   Active ROM Right eval Left eval  Shoulder flexion 170 140  Shoulder extension    Shoulder abduction 165 55  Shoulder adduction    Shoulder internal rotation  70  Shoulder external rotation  55  Elbow flexion    Elbow extension    Wrist flexion    Wrist extension    Wrist ulnar deviation    Wrist radial deviation    Wrist pronation    Wrist supination    (Blank rows = not tested)  UPPER EXTREMITY MMT:  MMT Right eval Left eval  Shoulder flexion  4+  Shoulder extension    Shoulder abduction  4  Shoulder adduction    Shoulder internal rotation  4-  Shoulder external rotation  4-  Middle trapezius    Lower trapezius    Elbow flexion    Elbow extension    Wrist flexion    Wrist extension    Wrist ulnar deviation    Wrist radial deviation    Wrist pronation    Wrist supination    Grip strength (lbs)    (Blank rows =  not tested)    JOINT MOBILITY TESTING:  WFL with PA mobilization                                                                                                                               TREATMENT DATE:   [facility] Adult PT Treatment:                                                DATE: 06/20/2024   Initial evaluation: see patient education and home exercise program as noted below     PATIENT EDUCATION: Education details: reviewed initial home exercise program; discussion of POC, prognosis and goals for skilled PT   Person educated: Patient Education method: Explanation, Demonstration, and Handouts Education comprehension: verbalized understanding,  returned demonstration, and needs further education  HOME EXERCISE PROGRAM: Access Code: [email or web address] URL: [email or web address] Date: 06/20/2024 Prepared by: Marko Molt  Exercises - Seated Shoulder Pendulum Exercise  - 1 x daily - 7 x weekly - 2 sets - 10 sec hold - Seated Shoulder Flexion Towel Slide at Table Top  - 1 x daily - 7 x weekly - 2 sets - 10 reps - 3 sec hold - Supine Shoulder Flexion Extension AAROM with Dowel  - 1 x daily - 7 x weekly - 2 sets - 10 reps - 3 sec hold  ASSESSMENT:  CLINICAL IMPRESSION: Daianna is a 68 y.o. female who was seen today for physical therapy evaluation and treatment for L shoulder pain with mobility deficits. She is demonstrating decreased L shoulder AROM, diminished L UE MMT, and decreased postural endurance. She has related pain and difficulty with overhead reaching, overhead lifting, heavy lifting, participation in regular exercise program, and performance of ADLs/IADLs. She requires skilled PT services at this time to address relevant deficits and improve overall function.     OBJECTIVE IMPAIRMENTS: decreased activity tolerance, decreased ROM, decreased strength, impaired UE functional use, postural dysfunction, and pain.   ACTIVITY LIMITATIONS: carrying, lifting, and reach over head  PARTICIPATION LIMITATIONS: meal prep, cleaning, laundry, driving, and community activity  PERSONAL FACTORS: Age and 3+ comorbidities: hx of OA, seizures/epilepsy, obesity, type 2 DM, HTN, anemia, depression, RA are also affecting patient's functional outcome.   REHAB POTENTIAL: Fair    CLINICAL DECISION MAKING: Evolving/moderate complexity  EVALUATION COMPLEXITY: Moderate   GOALS: Goals reviewed with patient? YES  SHORT TERM GOALS: Target date: 07/18/2024    Patient will be independent with initial home program at least 3 days/week.  Baseline: provided at eval Goal Status: INITIAL   2.  Patient will demonstrate improved postural  awareness for at least 15 minutes while seated without need for cueing from PT.  Baseline: see objective measures Goal Status: INITIAL   3.  Patient will demonstrate improved L shoulder AROM by at least 10-15 degrees flexion  Baseline: see objective measures Goal Status: INITIAL     LONG TERM GOALS: Target date: 08/15/2024    Patient will report improved overall functional ability with quickDASH score of 27.3.  Baseline: 10 or less Goal Status: INITIAL    2.  Patient will demonstrate at least 120 degrees of L shoulder abduction AROM.  Baseline:  Goal status: INITIAL  3.  Patient will demonstrate at least 4+/5 MMT L shoulder  Baseline: see objective measures  Goal status: INITIAL  4.  Patient will report at least 85-90% confidence with return to  independent strength training program for UQ.   Goal Status: INITIAL      PLAN:  PT FREQUENCY: 1-2x/week  PT DURATION: 8 weeks  PLANNED INTERVENTIONS: 02835- PT Re-evaluation, 97750- Physical Performance Testing, 97110-Therapeutic exercises, 97530- Therapeutic activity, [id number]- Neuromuscular re-education, 97535- Self Care, 02859- Manual therapy, [id number]- Electrical stimulation (unattended), [phone]- Electrical stimulation (manual), 20560 (1-2 muscles), 20561 (3+ muscles)- Dry Needling, Patient/Family education, Cryotherapy, and Moist heat  PLAN FOR NEXT SESSION: address L shoulder ROM, isometric strengthening, periscapular strengthening, RTC strengthening; otherwise address pain modulation via UBE, manual therapy and modalities as indicated   Marko Molt, PT, DPT  06/26/2024 1:52 PM   Date of referral: 05/17/2024 Referring provider: Donah Laymon PARAS, MD Referring diagnosis? M75.42 (ICD-10-CM) - Subacromial impingement of left shoulder Treatment diagnosis? (if different than referring diagnosis)  Left shoulder pain, unspecified chronicity  What was this (referring dx) caused by? Ongoing Issue  Lysle of Condition: Initial  Onset (within last 3 months)   Laterality: Lt  Current Functional Measure Score: DASH 27.3  Objective measurements identify impairments when they are compared to normal values, the uninvolved extremity, and prior level of function.  [x]  Yes  []  No  Objective assessment of functional ability: Moderate functional limitations   Briefly describe symptoms: L shoulder pain with ROM, overhead reaching and shoulder weakness  How did symptoms start: beginning new weight training program   Average pain intensity:  Last 24 hours: 5/10  Past week: 10/10  How often does the pt experience symptoms? Frequently  How much have the symptoms interfered with usual daily activities? Moderately  How has condition changed since care began at this facility? NA - initial visit  In general, how is the patients overall health? Fair   BACK PAIN (STarT Back Screening Tool) No   "

## 2024-07-03 ENCOUNTER — Ambulatory Visit

## 2024-07-03 DIAGNOSIS — M25512 Pain in left shoulder: Secondary | ICD-10-CM | POA: Diagnosis not present

## 2024-07-03 NOTE — Therapy (Signed)
 " OUTPATIENT PHYSICAL THERAPY TREATMENT NOTE   Patient Name: Kristin [name] MRN: [id number] DOB:12/30/1955, 68 y.o., female Today's Date: 07/03/2024  END OF SESSION:   PT End of Session - 07/03/24 1528     Visit Number 2    Number of Visits 16    Date for Recertification  08/15/24    Authorization Type [facility] MCR/MCD    Authorization Time Period VL- medical necessity    PT Start Time 1530    PT Stop Time 1603    PT Time Calculation (min) 33 min    Activity Tolerance Patient tolerated treatment well    Behavior During Therapy WFL for tasks assessed/performed         Past Medical History:  Diagnosis Date   Allergy    seasonal   Anemia    Anxiety    Arthritis    Back pain, chronic    Borderline diabetes    Bronchitis    Constipation    current issue   Cough    Depression    Epilepsy (HCC)    GERD (gastroesophageal reflux disease)    Hyperlipidemia    Hypertension    IBS (irritable bowel syndrome)    Joint pain    Obesity    Palpitations    Pneumonia 2016   Pre-diabetes    Renal sclerosis, unspecified    only has 1 kidney   Rheumatoid arthritis (HCC)    Seizures (HCC)    last >15 +  yrs ago- recorded 07-26-2021   Somatization disorder    Tobacco abuse    Past Surgical History:  Procedure Laterality Date   ABDOMINAL HYSTERECTOMY     ANTERIOR HIP REVISION Left 07/25/2016   Procedure: LEFT HIP IRRIGATION AND DEBRIDEMENT, REVISION OF HEAD AND LINER;  Surgeon: Kay CHRISTELLA Cummins, MD;  Location: [facility];  Service: Orthopedics;  Laterality: Left;   APPLICATION OF WOUND VAC Left 12/23/2019   Procedure: APPLICATION OF WOUND VAC;  Surgeon: Cummins Kay CHRISTELLA, MD;  Location: [facility];  Service: Orthopedics;  Laterality: Left;   BREAST EXCISIONAL BIOPSY Right    CHOLECYSTECTOMY     COLONOSCOPY     DEBRIDEMENT AND CLOSURE WOUND Left 07/17/2019   Procedure: Excision of left leg wound with ACell placement and primary closure;  Surgeon: Lowery Estefana RAMAN, DO;  Location: [facility];  Service: Plastics;  Laterality: Left;  60 min   EXCISIONAL TOTAL HIP ARTHROPLASTY WITH ANTIBIOTIC SPACERS Left 12/23/2019   Procedure: LEFT HIP IRRIGATION AND DEBRIDEMENT LEFT HIP WOUND and poly exchange ;  Surgeon: Cummins Kay CHRISTELLA, MD;  Location: [facility];  Service: Orthopedics;  Laterality: Left;   HIP DEBRIDEMENT Left 01/19/2018   Procedure(s) Performed: IRRIGATION AND DEBRIDEMENT LEFT HIP (Left )   INCISION AND DRAINAGE HIP Left 08/03/2016   Procedure: IRRIGATION AND DEBRIDEMENT LEFT HIP, VAC PLACEMENT;  Surgeon: Kay CHRISTELLA Cummins, MD;  Location: [facility];  Service: Orthopedics;  Laterality: Left;   INCISION AND DRAINAGE HIP Left 01/19/2018   Procedure: IRRIGATION AND DEBRIDEMENT LEFT HIP;  Surgeon: Cummins Kay CHRISTELLA, MD;  Location: [facility];  Service: Orthopedics;  Laterality: Left;   kidney stent Right 2002   TONSILLECTOMY Bilateral 12/06/2014   Procedure: TONSILLECTOMY, BILATERAL;  Surgeon: Merilee Kraft, MD;  Location: [facility] OR;  Service: ENT;  Laterality: Bilateral;   TONSILLECTOMY     TOTAL HIP ARTHROPLASTY Left 06/01/2016   Procedure: LEFT TOTAL HIP ARTHROPLASTY ANTERIOR APPROACH;  Surgeon: Kay CHRISTELLA Cummins, MD;  Location: [facility];  Service: Orthopedics;  Laterality: Left;   TUBAL LIGATION     WOUND EXPLORATION Left 01/19/2018   Procedure: WOUND EXPLORATION;  Surgeon: Jerri Kay HERO, MD;  Location: [facility];  Service: Orthopedics;  Laterality: Left;   Patient Active Problem List   Diagnosis Date Noted   Medication monitoring encounter 05/24/2024   Immunization counseling 05/24/2024   MRSA infection 03/31/2024   Medication management 03/26/2024   Presence of left artificial hip joint 12/10/2021   Type 2 diabetes mellitus with obesity 12/09/2021   HTN (hypertension) 12/08/2021   Seizures (HCC) 11/26/2021   Hypercalcemia 10/24/2019   Body mass index 40.0-44.9, adult (HCC) 02/15/2018   Prosthetic joint infection of left hip 08/24/2016   Status post left hip replacement 06/01/2016   Sinus  tachycardia 05/19/2016   Thyroid  nodule 12/25/2014   Infiltrate noted on imaging study    UNSPECIFIED RENAL SCLEROSIS 01/16/2009   GERD, SEVERE 05/25/2007   Morbid obesity (HCC) 04/03/2007   Dyslipidemia 08/31/2006   Former smoker 08/31/2006   HYPERTENSION, BENIGN SYSTEMIC 08/31/2006   CONVULSIONS, SEIZURES, NOS 08/31/2006    PCP: Lorrane Pac, MD   REFERRING PROVIDER:  Donah Laymon PARAS, MD    REFERRING DIAG:  [phone] (ICD-10-CM) - Subacromial impingement of left shoulder  Rotator cuff strengthening     THERAPY DIAG:  Left shoulder pain, unspecified chronicity  Rationale for Evaluation and Treatment: Rehabilitation  ONSET DATE: 05/04/2024  SUBJECTIVE:                                                                                                                                                                                      SUBJECTIVE STATEMENT: Patient reports that she hasn't been doing her HEP but her pain has been improving.   EVAL: She confirms that her Left shoulder began hurting after beginning a weight training program. She did have a cortisone shot from Dr. Jerri, 2 weeks ago, which has been helpful. She states that she was told to take a break from exercising. She would like to return to an exercise routine for her weight loss goals.  Hand dominance: Right  PERTINENT HISTORY: hx of OA, seizures/epilepsy, obesity, type 2 DM, HTN, anemia, depression, RA  PAIN:  Are you having pain?  Yes: NPRS scale: 5/10 current, 10/10 worst Pain location: L shoulder and L UE Pain description: shooting, tingling Aggravating factors: OH lifting, lifting something heavy  Relieving factors: cortisone injection, heat pad,   PRECAUTIONS: None  RED FLAGS: None   WEIGHT BEARING RESTRICTIONS: No  FALLS:  Has patient fallen in last 6 months? No  LIVING ENVIRONMENT: Lives with: lives with their spouse Lives in: House/apartment Stairs: No Has  following equipment at  home: None  OCCUPATION: N/a  PLOF: Independent with household mobility with device, Independent with community mobility with device, and Leisure: crafting  PATIENT GOALS:able to move my arm without pain, learning better way to exercise   NEXT MD VISIT:   OBJECTIVE:  Note: Objective measures were completed at Evaluation unless otherwise noted.  DIAGNOSTIC FINDINGS:  06/06/24: L Shoulder X-Ray   X-rays of the left shoulder show degenerative spurring of the acromion and  AC joint.  Slight arthritic changes of the glenohumeral joint.  No acute  abnormalities.   PATIENT SURVEYS:  Quick Dash:  QUICK DASH  Please rate your ability do the following activities in the last week by selecting the number below the appropriate response.   Activities Rating  Open a tight or new jar.  3 = Moderate difficulty  Do heavy household chores (e.g., wash walls, floors). 5 = Unable  Carry a shopping bag or briefcase 2 = Mild difficulty  Wash your back. 5 = Unable  Use a knife to cut food. 2 = Mild difficulty  Recreational activities in which you take some force or impact through your arm, shoulder or hand (e.g., golf, hammering, tennis, etc.). 3 = Moderate difficulty  During the past week, to what extent has your arm, shoulder or hand problem interfered with your normal social activities with family, friends, neighbors or groups?  3 = Moderately  During the past week, were you limited in your work or other regular daily activities as a result of your arm, shoulder or hand problem? 2 = Slightly limited  Rate the severity of the following symptoms in the last week: Arm, Shoulder, or hand pain. 3 = Moderate  Rate the severity of the following symptoms in the last week: Tingling (pins and needles) in your arm, shoulder or hand. 3 = Moderate  During the past week, how much difficulty have you had sleeping because of the pain in your arm, shoulder or hand?  3 = Moderate difficulty   (A QuickDASH score may  not be calculated if there is greater than 1 missing item.)  Quick Dash Disability/Symptom Score: 27.3  Minimally Clinically Important Difference (MCID): 15-20 points  Flavio, F. et al. (2013). Minimally clinically important difference of the disabilities of the arm, shoulder, and hand outcome measures (DASH) and its shortened version (Quick DASH). Journal of Orthopaedic & Sports Physical Therapy, 44(1), 30-39)   COGNITION: Overall cognitive status: Within functional limits for tasks assessed     SENSATION: Not tested  POSTURE: Rounded shoulder, forward head  UPPER EXTREMITY ROM:   Active ROM Right eval Left eval  Shoulder flexion 170 140  Shoulder extension    Shoulder abduction 165 55  Shoulder adduction    Shoulder internal rotation  70  Shoulder external rotation  55  Elbow flexion    Elbow extension    Wrist flexion    Wrist extension    Wrist ulnar deviation    Wrist radial deviation    Wrist pronation    Wrist supination    (Blank rows = not tested)  UPPER EXTREMITY MMT:  MMT Right eval Left eval  Shoulder flexion  4+  Shoulder extension    Shoulder abduction  4  Shoulder adduction    Shoulder internal rotation  4-  Shoulder external rotation  4-  Middle trapezius    Lower trapezius    Elbow flexion    Elbow extension    Wrist flexion    Wrist extension  Wrist ulnar deviation    Wrist radial deviation    Wrist pronation    Wrist supination    Grip strength (lbs)    (Blank rows = not tested)    JOINT MOBILITY TESTING:  WFL with PA mobilization                                                                                                                               TREATMENT DATE:  [facility] Adult PT Treatment:                                                DATE: 07/03/24 Therapeutic Exercise: UBE level 1 3'/3' while gathering subjective and planning session with patient Pulleys flex/scaption x2' ea Seated ITYs 1# x10  ea Neuromuscular re-ed: Rows RTB 2x10 Shoulder ext RTB 2x10 Seated BIL ER RTB 2x10 Seated horizontal abduction RTB 2x10  [facility] Adult PT Treatment:                                                DATE: 06/20/2024   Initial evaluation: see patient education and home exercise program as noted below     PATIENT EDUCATION: Education details: reviewed initial home exercise program; discussion of POC, prognosis and goals for skilled PT   Person educated: Patient Education method: Explanation, Demonstration, and Handouts Education comprehension: verbalized understanding, returned demonstration, and needs further education  HOME EXERCISE PROGRAM: Access Code: [email or web address] URL: [email or web address] Date: 07/03/2024 Prepared by: Corean Pouch  Exercises - Supine Shoulder Flexion Extension AAROM with Dowel  - 1 x daily - 7 x weekly - 2 sets - 10 reps - 3 sec hold - Standing Shoulder Horizontal Abduction with Resistance  - 1 x daily - 7 x weekly - 2 sets - 10 reps - SEATED Shoulder External Rotation and Scapular Retraction with Resistance  - 1 x daily - 7 x weekly - 2 sets - 10 reps  ASSESSMENT:  CLINICAL IMPRESSION: Patient presents to first follow up PT session reporting minimal pain in her shoulder currently and that he hasn't been doing her HEP. Session today focused on RTC and periscapular strengthening. Seated exercises work better for her current mobility deficits. Updated HEP with patient demonstrating understanding of each exercise. Patient was able to tolerate all prescribed exercises with no adverse effects. Patient continues to benefit from skilled PT services and should be progressed as able to improve functional independence.   EVAL: Kristin [name] is a 68 y.o. female who was seen today for physical therapy evaluation and treatment for L shoulder pain with mobility deficits. She is demonstrating decreased L shoulder AROM, diminished L UE MMT, and decreased postural  endurance. She has related  pain and difficulty with overhead reaching, overhead lifting, heavy lifting, participation in regular exercise program, and performance of ADLs/IADLs. She requires skilled PT services at this time to address relevant deficits and improve overall function.     OBJECTIVE IMPAIRMENTS: decreased activity tolerance, decreased ROM, decreased strength, impaired UE functional use, postural dysfunction, and pain.   ACTIVITY LIMITATIONS: carrying, lifting, and reach over head  PARTICIPATION LIMITATIONS: meal prep, cleaning, laundry, driving, and community activity  PERSONAL FACTORS: Age and 3+ comorbidities: hx of OA, seizures/epilepsy, obesity, type 2 DM, HTN, anemia, depression, RA are also affecting patient's functional outcome.   REHAB POTENTIAL: Fair    CLINICAL DECISION MAKING: Evolving/moderate complexity  EVALUATION COMPLEXITY: Moderate   GOALS: Goals reviewed with patient? YES  SHORT TERM GOALS: Target date: 07/18/2024   Patient will be independent with initial home program at least 3 days/week.  Baseline: provided at eval Goal Status: INITIAL   2.  Patient will demonstrate improved postural awareness for at least 15 minutes while seated without need for cueing from PT.  Baseline: see objective measures Goal Status: INITIAL   3.  Patient will demonstrate improved L shoulder AROM by at least 10-15 degrees flexion  Baseline: see objective measures Goal Status: INITIAL     LONG TERM GOALS: Target date: 08/15/2024    Patient will report improved overall functional ability with quickDASH score of 27.3.  Baseline: 10 or less Goal Status: INITIAL    2.  Patient will demonstrate at least 120 degrees of L shoulder abduction AROM.  Baseline:  Goal status: INITIAL  3.  Patient will demonstrate at least 4+/5 MMT L shoulder  Baseline: see objective measures  Goal status: INITIAL  4.  Patient will report at least 85-90% confidence with return to   independent strength training program for UQ.   Goal Status: INITIAL      PLAN:  PT FREQUENCY: 1-2x/week  PT DURATION: 8 weeks  PLANNED INTERVENTIONS: [id number]- PT Re-evaluation, 97750- Physical Performance Testing, 97110-Therapeutic exercises, 97530- Therapeutic activity, [id number]- Neuromuscular re-education, 97535- Self Care, [id number]- Manual therapy, [id number]- Electrical stimulation (unattended), [phone]- Electrical stimulation (manual), 20560 (1-2 muscles), 20561 (3+ muscles)- Dry Needling, Patient/Family education, Cryotherapy, and Moist heat  PLAN FOR NEXT SESSION: address L shoulder ROM, isometric strengthening, periscapular strengthening, RTC strengthening; otherwise address pain modulation via UBE, manual therapy and modalities as indicated   Corean Pouch PTA  07/03/2024 4:06 PM   Date of referral: 05/17/2024 Referring provider: Donah Laymon PARAS, MD Referring diagnosis? M75.42 (ICD-10-CM) - Subacromial impingement of left shoulder Treatment diagnosis? (if different than referring diagnosis)  Left shoulder pain, unspecified chronicity  What was this (referring dx) caused by? Ongoing Issue  Lysle of Condition: Initial Onset (within last 3 months)   Laterality: Lt  Current Functional Measure Score: DASH 27.3  Objective measurements identify impairments when they are compared to normal values, the uninvolved extremity, and prior level of function.  [x]  Yes  []  No  Objective assessment of functional ability: Moderate functional limitations   Briefly describe symptoms: L shoulder pain with ROM, overhead reaching and shoulder weakness  How did symptoms start: beginning new weight training program   Average pain intensity:  Last 24 hours: 5/10  Past week: 10/10  How often does the pt experience symptoms? Frequently  How much have the symptoms interfered with usual daily activities? Moderately  How has condition changed since care began at this facility? NA - initial  visit  In general, how is the patients overall health? Fair  BACK PAIN (STarT Back Screening Tool) No   "

## 2024-07-08 NOTE — Therapy (Signed)
 " OUTPATIENT PHYSICAL THERAPY TREATMENT NOTE   Patient Name: Kristin [name] MRN: [id number] DOB:03-Mar-1956, 69 y.o., female Today's Date: 07/09/2024  END OF SESSION:   PT End of Session - 07/09/24 1444     Visit Number 3    Number of Visits 16    Date for Recertification  08/15/24    Authorization Type [employer] MCR/MCD    Authorization Time Period VL- medical necessity    PT Start Time 1445    PT Stop Time 1523    PT Time Calculation (min) 38 min    Activity Tolerance Patient tolerated treatment well    Behavior During Therapy WFL for tasks assessed/performed          Past Medical History:  Diagnosis Date   Allergy    seasonal   Anemia    Anxiety    Arthritis    Back pain, chronic    Borderline diabetes    Bronchitis    Constipation    current issue   Cough    Depression    Epilepsy (HCC)    GERD (gastroesophageal reflux disease)    Hyperlipidemia    Hypertension    IBS (irritable bowel syndrome)    Joint pain    Obesity    Palpitations    Pneumonia 2016   Pre-diabetes    Renal sclerosis, unspecified    only has 1 kidney   Rheumatoid arthritis (HCC)    Seizures (HCC)    last >15 +  yrs ago- recorded 07-26-2021   Somatization disorder    Tobacco abuse    Past Surgical History:  Procedure Laterality Date   ABDOMINAL HYSTERECTOMY     ANTERIOR HIP REVISION Left 07/25/2016   Procedure: LEFT HIP IRRIGATION AND DEBRIDEMENT, REVISION OF HEAD AND LINER;  Surgeon: Kay CHRISTELLA Cummins, MD;  Location: [facility];  Service: Orthopedics;  Laterality: Left;   APPLICATION OF WOUND VAC Left 12/23/2019   Procedure: APPLICATION OF WOUND VAC;  Surgeon: Cummins Kay CHRISTELLA, MD;  Location: [facility];  Service: Orthopedics;  Laterality: Left;   BREAST EXCISIONAL BIOPSY Right    CHOLECYSTECTOMY     COLONOSCOPY     DEBRIDEMENT AND CLOSURE WOUND Left 07/17/2019   Procedure: Excision of left leg wound with ACell placement and primary closure;  Surgeon: Lowery Estefana RAMAN, DO;  Location: [facility];  Service: Plastics;  Laterality: Left;  60 min   EXCISIONAL TOTAL HIP ARTHROPLASTY WITH ANTIBIOTIC SPACERS Left 12/23/2019   Procedure: LEFT HIP IRRIGATION AND DEBRIDEMENT LEFT HIP WOUND and poly exchange ;  Surgeon: Cummins Kay CHRISTELLA, MD;  Location: [facility];  Service: Orthopedics;  Laterality: Left;   HIP DEBRIDEMENT Left 01/19/2018   Procedure(s) Performed: IRRIGATION AND DEBRIDEMENT LEFT HIP (Left )   INCISION AND DRAINAGE HIP Left 08/03/2016   Procedure: IRRIGATION AND DEBRIDEMENT LEFT HIP, VAC PLACEMENT;  Surgeon: Kay CHRISTELLA Cummins, MD;  Location: [facility];  Service: Orthopedics;  Laterality: Left;   INCISION AND DRAINAGE HIP Left 01/19/2018   Procedure: IRRIGATION AND DEBRIDEMENT LEFT HIP;  Surgeon: Cummins Kay CHRISTELLA, MD;  Location: [facility];  Service: Orthopedics;  Laterality: Left;   kidney stent Right 2002   TONSILLECTOMY Bilateral 12/06/2014   Procedure: TONSILLECTOMY, BILATERAL;  Surgeon: Merilee Kraft, MD;  Location: [facility] OR;  Service: ENT;  Laterality: Bilateral;   TONSILLECTOMY     TOTAL HIP ARTHROPLASTY Left 06/01/2016   Procedure: LEFT TOTAL HIP ARTHROPLASTY ANTERIOR APPROACH;  Surgeon: Kay CHRISTELLA Cummins, MD;  Location: [facility]  OR;  Service: Orthopedics;  Laterality: Left;   TUBAL LIGATION     WOUND EXPLORATION Left 01/19/2018   Procedure: WOUND EXPLORATION;  Surgeon: Jerri Kay HERO, MD;  Location: [facility];  Service: Orthopedics;  Laterality: Left;   Patient Active Problem List   Diagnosis Date Noted   Medication monitoring encounter 05/24/2024   Immunization counseling 05/24/2024   MRSA infection 03/31/2024   Medication management 03/26/2024   Presence of left artificial hip joint 12/10/2021   Type 2 diabetes mellitus with obesity 12/09/2021   HTN (hypertension) 12/08/2021   Seizures (HCC) 11/26/2021   Hypercalcemia 10/24/2019   Body mass index 40.0-44.9, adult (HCC) 02/15/2018   Prosthetic joint infection of left hip 08/24/2016   Status post left hip replacement 06/01/2016   Sinus  tachycardia 05/19/2016   Thyroid  nodule 12/25/2014   Infiltrate noted on imaging study    UNSPECIFIED RENAL SCLEROSIS 01/16/2009   GERD, SEVERE 05/25/2007   Morbid obesity (HCC) 04/03/2007   Dyslipidemia 08/31/2006   Former smoker 08/31/2006   HYPERTENSION, BENIGN SYSTEMIC 08/31/2006   CONVULSIONS, SEIZURES, NOS 08/31/2006    PCP: Lorrane Pac, MD   REFERRING PROVIDER:  Donah Laymon PARAS, MD    REFERRING DIAG:  [phone] (ICD-10-CM) - Subacromial impingement of left shoulder  Rotator cuff strengthening     THERAPY DIAG:  Left shoulder pain, unspecified chronicity  Rationale for Evaluation and Treatment: Rehabilitation  ONSET DATE: 05/04/2024  SUBJECTIVE:                                                                                                                                                                                      SUBJECTIVE STATEMENT: Patient reports no worsening of symptoms since last visit.   EVAL: She confirms that her Left shoulder began hurting after beginning a weight training program. She did have a cortisone shot from Dr. Jerri, 2 weeks ago, which has been helpful. She states that she was told to take a break from exercising. She would like to return to an exercise routine for her weight loss goals.  Hand dominance: Right  PERTINENT HISTORY: hx of OA, seizures/epilepsy, obesity, type 2 DM, HTN, anemia, depression, RA  PAIN:  Are you having pain?  Yes: NPRS scale: 5/10 current, 10/10 worst Pain location: L shoulder and L UE Pain description: shooting, tingling Aggravating factors: OH lifting, lifting something heavy  Relieving factors: cortisone injection, heat pad,   PRECAUTIONS: None  RED FLAGS: None   WEIGHT BEARING RESTRICTIONS: No  FALLS:  Has patient fallen in last 6 months? No  LIVING ENVIRONMENT: Lives with: lives with their spouse Lives in: House/apartment Stairs: No Has following equipment at home:  None  OCCUPATION: N/a  PLOF: Independent with household mobility with device, Independent with community mobility with device, and Leisure: crafting  PATIENT GOALS:able to move my arm without pain, learning better way to exercise   NEXT MD VISIT:   OBJECTIVE:  Note: Objective measures were completed at Evaluation unless otherwise noted.  DIAGNOSTIC FINDINGS:  06/06/24: L Shoulder X-Ray   X-rays of the left shoulder show degenerative spurring of the acromion and  AC joint.  Slight arthritic changes of the glenohumeral joint.  No acute  abnormalities.   PATIENT SURVEYS:  Quick Dash:  QUICK DASH  Please rate your ability do the following activities in the last week by selecting the number below the appropriate response.   Activities Rating  Open a tight or new jar.  3 = Moderate difficulty  Do heavy household chores (e.g., wash walls, floors). 5 = Unable  Carry a shopping bag or briefcase 2 = Mild difficulty  Wash your back. 5 = Unable  Use a knife to cut food. 2 = Mild difficulty  Recreational activities in which you take some force or impact through your arm, shoulder or hand (e.g., golf, hammering, tennis, etc.). 3 = Moderate difficulty  During the past week, to what extent has your arm, shoulder or hand problem interfered with your normal social activities with family, friends, neighbors or groups?  3 = Moderately  During the past week, were you limited in your work or other regular daily activities as a result of your arm, shoulder or hand problem? 2 = Slightly limited  Rate the severity of the following symptoms in the last week: Arm, Shoulder, or hand pain. 3 = Moderate  Rate the severity of the following symptoms in the last week: Tingling (pins and needles) in your arm, shoulder or hand. 3 = Moderate  During the past week, how much difficulty have you had sleeping because of the pain in your arm, shoulder or hand?  3 = Moderate difficulty   (A QuickDASH score may not be  calculated if there is greater than 1 missing item.)  Quick Dash Disability/Symptom Score: 27.3  Minimally Clinically Important Difference (MCID): 15-20 points  Flavio, F. et al. (2013). Minimally clinically important difference of the disabilities of the arm, shoulder, and hand outcome measures (DASH) and its shortened version (Quick DASH). Journal of Orthopaedic & Sports Physical Therapy, 44(1), 30-39)   COGNITION: Overall cognitive status: Within functional limits for tasks assessed     SENSATION: Not tested  POSTURE: Rounded shoulder, forward head  UPPER EXTREMITY ROM:   Active ROM Right eval Left eval  Shoulder flexion 170 140  Shoulder extension    Shoulder abduction 165 55  Shoulder adduction    Shoulder internal rotation  70  Shoulder external rotation  55  Elbow flexion    Elbow extension    Wrist flexion    Wrist extension    Wrist ulnar deviation    Wrist radial deviation    Wrist pronation    Wrist supination    (Blank rows = not tested)  UPPER EXTREMITY MMT:  MMT Right eval Left eval  Shoulder flexion  4+  Shoulder extension    Shoulder abduction  4  Shoulder adduction    Shoulder internal rotation  4-  Shoulder external rotation  4-  Middle trapezius    Lower trapezius    Elbow flexion    Elbow extension    Wrist flexion    Wrist extension    Wrist ulnar  deviation    Wrist radial deviation    Wrist pronation    Wrist supination    Grip strength (lbs)    (Blank rows = not tested)    JOINT MOBILITY TESTING:  WFL with PA mobilization                                                                                                                              TREATMENT DATE:    [facility] Adult PT Treatment:                                                DATE: 07/09/2024  Therapeutic Exercise: UBE level 1 3'/3' while gathering subjective and planning session with patient Pulleys flex/scaption x2' ea Seated ITYs 1# x10  ea  Neuromuscular re-ed: Rows RTB 2x10 Shoulder ext RTB 2x10 Seated BIL ER RTB 2x10 Seated horizontal abduction RTB 2x10  [facility] Adult PT Treatment:                                                DATE: 07/03/24 Therapeutic Exercise: UBE level 1 3'/3' while gathering subjective and planning session with patient Pulleys flex/scaption x2' ea Seated ITYs 1# x10 ea Neuromuscular re-ed: Rows RTB 2x10 Shoulder ext RTB 2x10 Seated BIL ER RTB 2x10 Seated horizontal abduction RTB 2x10  [facility] Adult PT Treatment:                                                DATE: 06/20/2024   Initial evaluation: see patient education and home exercise program as noted below     PATIENT EDUCATION: Education details: reviewed initial home exercise program; discussion of POC, prognosis and goals for skilled PT   Person educated: Patient Education method: Explanation, Demonstration, and Handouts Education comprehension: verbalized understanding, returned demonstration, and needs further education  HOME EXERCISE PROGRAM: Access Code: [email or web address] URL: [email or web address] Date: 07/03/2024 Prepared by: Corean Pouch  Exercises - Supine Shoulder Flexion Extension AAROM with Dowel  - 1 x daily - 7 x weekly - 2 sets - 10 reps - 3 sec hold - Standing Shoulder Horizontal Abduction with Resistance  - 1 x daily - 7 x weekly - 2 sets - 10 reps - SEATED Shoulder External Rotation and Scapular Retraction with Resistance  - 1 x daily - 7 x weekly - 2 sets - 10 reps  ASSESSMENT:  CLINICAL IMPRESSION: Kristin [name] had fair tolerance of today's treatment session.We will continue to progress per POC as tolerated, in order to reach established rehab goals.    EVAL:  Kristin [name] is a 69 y.o. female who was seen today for physical therapy evaluation and treatment for L shoulder pain with mobility deficits. She is demonstrating decreased L shoulder AROM, diminished L UE MMT, and decreased postural endurance. She  has related pain and difficulty with overhead reaching, overhead lifting, heavy lifting, participation in regular exercise program, and performance of ADLs/IADLs. She requires skilled PT services at this time to address relevant deficits and improve overall function.     OBJECTIVE IMPAIRMENTS: decreased activity tolerance, decreased ROM, decreased strength, impaired UE functional use, postural dysfunction, and pain.   ACTIVITY LIMITATIONS: carrying, lifting, and reach over head  PARTICIPATION LIMITATIONS: meal prep, cleaning, laundry, driving, and community activity  PERSONAL FACTORS: Age and 3+ comorbidities: hx of OA, seizures/epilepsy, obesity, type 2 DM, HTN, anemia, depression, RA are also affecting patient's functional outcome.   REHAB POTENTIAL: Fair    CLINICAL DECISION MAKING: Evolving/moderate complexity  EVALUATION COMPLEXITY: Moderate   GOALS: Goals reviewed with patient? YES  SHORT TERM GOALS: Target date: 07/18/2024   Patient will be independent with initial home program at least 3 days/week.  Baseline: provided at eval Goal Status: INITIAL   2.  Patient will demonstrate improved postural awareness for at least 15 minutes while seated without need for cueing from PT.  Baseline: see objective measures Goal Status: INITIAL   3.  Patient will demonstrate improved L shoulder AROM by at least 10-15 degrees flexion  Baseline: see objective measures Goal Status: INITIAL     LONG TERM GOALS: Target date: 08/15/2024    Patient will report improved overall functional ability with quickDASH score of 27.3.  Baseline: 10 or less Goal Status: INITIAL    2.  Patient will demonstrate at least 120 degrees of L shoulder abduction AROM.  Baseline:  Goal status: INITIAL  3.  Patient will demonstrate at least 4+/5 MMT L shoulder  Baseline: see objective measures  Goal status: INITIAL  4.  Patient will report at least 85-90% confidence with return to  independent strength  training program for UQ.   Goal Status: INITIAL      PLAN:  PT FREQUENCY: 1-2x/week  PT DURATION: 8 weeks  PLANNED INTERVENTIONS: 97164- PT Re-evaluation, 97750- Physical Performance Testing, 97110-Therapeutic exercises, 97530- Therapeutic activity, [id number]- Neuromuscular re-education, 97535- Self Care, [id number]- Manual therapy, [id number]- Electrical stimulation (unattended), [id number]- Electrical stimulation (manual), 20560 (1-2 muscles), 20561 (3+ muscles)- Dry Needling, Patient/Family education, Cryotherapy, and Moist heat  PLAN FOR NEXT SESSION: address L shoulder ROM, isometric strengthening, periscapular strengthening, RTC strengthening; otherwise address pain modulation via UBE, manual therapy and modalities as indicated   Marko Molt, PT, DPT  07/09/2024 3:33 PM    Date of referral: 05/17/2024 Referring provider: Donah Laymon PARAS, MD Referring diagnosis? M75.42 (ICD-10-CM) - Subacromial impingement of left shoulder Treatment diagnosis? (if different than referring diagnosis)  Left shoulder pain, unspecified chronicity  What was this (referring dx) caused by? Ongoing Issue  Lysle of Condition: Initial Onset (within last 3 months)   Laterality: Lt  Current Functional Measure Score: DASH 27.3  Objective measurements identify impairments when they are compared to normal values, the uninvolved extremity, and prior level of function.  [x]  Yes  []  No  Objective assessment of functional ability: Moderate functional limitations   Briefly describe symptoms: L shoulder pain with ROM, overhead reaching and shoulder weakness  How did symptoms start: beginning new weight training program   Average pain intensity:  Last 24 hours: 5/10  Past week: 10/10  How often does  the pt experience symptoms? Frequently  How much have the symptoms interfered with usual daily activities? Moderately  How has condition changed since care began at this facility? NA - initial visit  In general,  how is the patients overall health? Fair   BACK PAIN (STarT Back Screening Tool) No   "

## 2024-07-09 ENCOUNTER — Ambulatory Visit: Attending: Family Medicine

## 2024-07-09 ENCOUNTER — Other Ambulatory Visit: Payer: Self-pay

## 2024-07-09 DIAGNOSIS — Z96649 Presence of unspecified artificial hip joint: Secondary | ICD-10-CM | POA: Insufficient documentation

## 2024-07-09 DIAGNOSIS — M25512 Pain in left shoulder: Secondary | ICD-10-CM | POA: Diagnosis present

## 2024-07-09 DIAGNOSIS — M6281 Muscle weakness (generalized): Secondary | ICD-10-CM | POA: Insufficient documentation

## 2024-07-09 DIAGNOSIS — M25552 Pain in left hip: Secondary | ICD-10-CM | POA: Diagnosis present

## 2024-07-11 ENCOUNTER — Ambulatory Visit

## 2024-07-11 DIAGNOSIS — M25512 Pain in left shoulder: Secondary | ICD-10-CM | POA: Diagnosis not present

## 2024-07-11 NOTE — Therapy (Signed)
 " OUTPATIENT PHYSICAL THERAPY TREATMENT NOTE   Patient Name: Kristin [name] MRN: [id number] DOB:08/30/1955, 69 y.o., female Today's Date: 07/11/2024  END OF SESSION:   PT End of Session - 07/11/24 1530     Visit Number 4    Number of Visits 16    Date for Recertification  08/15/24    Authorization Type [facility] MCR/MCD    Authorization Time Period VL- medical necessity    PT Start Time 1530    PT Stop Time 1608    PT Time Calculation (min) 38 min    Activity Tolerance Patient tolerated treatment well    Behavior During Therapy WFL for tasks assessed/performed           Past Medical History:  Diagnosis Date   Allergy    seasonal   Anemia    Anxiety    Arthritis    Back pain, chronic    Borderline diabetes    Bronchitis    Constipation    current issue   Cough    Depression    Epilepsy (HCC)    GERD (gastroesophageal reflux disease)    Hyperlipidemia    Hypertension    IBS (irritable bowel syndrome)    Joint pain    Obesity    Palpitations    Pneumonia 2016   Pre-diabetes    Renal sclerosis, unspecified    only has 1 kidney   Rheumatoid arthritis (HCC)    Seizures (HCC)    last >15 +  yrs ago- recorded 07-26-2021   Somatization disorder    Tobacco abuse    Past Surgical History:  Procedure Laterality Date   ABDOMINAL HYSTERECTOMY     ANTERIOR HIP REVISION Left 07/25/2016   Procedure: LEFT HIP IRRIGATION AND DEBRIDEMENT, REVISION OF HEAD AND LINER;  Surgeon: Kay CHRISTELLA Cummins, MD;  Location: [facility];  Service: Orthopedics;  Laterality: Left;   APPLICATION OF WOUND VAC Left 12/23/2019   Procedure: APPLICATION OF WOUND VAC;  Surgeon: Cummins Kay CHRISTELLA, MD;  Location: [facility];  Service: Orthopedics;  Laterality: Left;   BREAST EXCISIONAL BIOPSY Right    CHOLECYSTECTOMY     COLONOSCOPY     DEBRIDEMENT AND CLOSURE WOUND Left 07/17/2019   Procedure: Excision of left leg wound with ACell placement and primary closure;  Surgeon: Lowery Estefana RAMAN, DO;  Location: [facility];  Service: Plastics;  Laterality: Left;  60 min   EXCISIONAL TOTAL HIP ARTHROPLASTY WITH ANTIBIOTIC SPACERS Left 12/23/2019   Procedure: LEFT HIP IRRIGATION AND DEBRIDEMENT LEFT HIP WOUND and poly exchange ;  Surgeon: Cummins Kay CHRISTELLA, MD;  Location: [facility];  Service: Orthopedics;  Laterality: Left;   HIP DEBRIDEMENT Left 01/19/2018   Procedure(s) Performed: IRRIGATION AND DEBRIDEMENT LEFT HIP (Left )   INCISION AND DRAINAGE HIP Left 08/03/2016   Procedure: IRRIGATION AND DEBRIDEMENT LEFT HIP, VAC PLACEMENT;  Surgeon: Kay CHRISTELLA Cummins, MD;  Location: [facility];  Service: Orthopedics;  Laterality: Left;   INCISION AND DRAINAGE HIP Left 01/19/2018   Procedure: IRRIGATION AND DEBRIDEMENT LEFT HIP;  Surgeon: Cummins Kay CHRISTELLA, MD;  Location: [facility];  Service: Orthopedics;  Laterality: Left;   kidney stent Right 2002   TONSILLECTOMY Bilateral 12/06/2014   Procedure: TONSILLECTOMY, BILATERAL;  Surgeon: Merilee Kraft, MD;  Location: [facility];  Service: ENT;  Laterality: Bilateral;   TONSILLECTOMY     TOTAL HIP ARTHROPLASTY Left 06/01/2016   Procedure: LEFT TOTAL HIP ARTHROPLASTY ANTERIOR APPROACH;  Surgeon: Kay CHRISTELLA Cummins, MD;  Location:  [facility];  Service: Orthopedics;  Laterality: Left;   TUBAL LIGATION     WOUND EXPLORATION Left 01/19/2018   Procedure: WOUND EXPLORATION;  Surgeon: Jerri Kay HERO, MD;  Location: [facility];  Service: Orthopedics;  Laterality: Left;   Patient Active Problem List   Diagnosis Date Noted   Medication monitoring encounter 05/24/2024   Immunization counseling 05/24/2024   MRSA infection 03/31/2024   Medication management 03/26/2024   Presence of left artificial hip joint 12/10/2021   Type 2 diabetes mellitus with obesity 12/09/2021   HTN (hypertension) 12/08/2021   Seizures (HCC) 11/26/2021   Hypercalcemia 10/24/2019   Body mass index 40.0-44.9, adult (HCC) 02/15/2018   Prosthetic joint infection of left hip 08/24/2016   Status post left hip replacement 06/01/2016   Sinus  tachycardia 05/19/2016   Thyroid  nodule 12/25/2014   Infiltrate noted on imaging study    UNSPECIFIED RENAL SCLEROSIS 01/16/2009   GERD, SEVERE 05/25/2007   Morbid obesity (HCC) 04/03/2007   Dyslipidemia 08/31/2006   Former smoker 08/31/2006   HYPERTENSION, BENIGN SYSTEMIC 08/31/2006   CONVULSIONS, SEIZURES, NOS 08/31/2006    PCP: Lorrane Pac, MD   REFERRING PROVIDER:  Donah Laymon PARAS, MD    REFERRING DIAG:  [phone] (ICD-10-CM) - Subacromial impingement of left shoulder  Rotator cuff strengthening     THERAPY DIAG:  Left shoulder pain, unspecified chronicity  Rationale for Evaluation and Treatment: Rehabilitation  ONSET DATE: 05/04/2024  SUBJECTIVE:                                                                                                                                                                                      SUBJECTIVE STATEMENT: Patient reporting that she has noticed more R arm pain with exercises. She has been having a hard time getting her exercises done at home though.   EVAL: She confirms that her Left shoulder began hurting after beginning a weight training program. She did have a cortisone shot from Dr. Jerri, 2 weeks ago, which has been helpful. She states that she was told to take a break from exercising. She would like to return to an exercise routine for her weight loss goals.  Hand dominance: Right  PERTINENT HISTORY: hx of OA, seizures/epilepsy, obesity, type 2 DM, HTN, anemia, depression, RA  PAIN:  Are you having pain?  Yes: NPRS scale: 5/10 current, 10/10 worst Pain location: L shoulder and L UE Pain description: shooting, tingling Aggravating factors: OH lifting, lifting something heavy  Relieving factors: cortisone injection, heat pad,   PRECAUTIONS: None  RED FLAGS: None   WEIGHT BEARING RESTRICTIONS: No  FALLS:  Has patient fallen in last 6 months? No  LIVING ENVIRONMENT: Lives with: lives with their spouse Lives  in: House/apartment Stairs: No Has following equipment at home: None  OCCUPATION: N/a  PLOF: Independent with household mobility with device, Independent with community mobility with device, and Leisure: crafting  PATIENT GOALS:able to move my arm without pain, learning better way to exercise   NEXT MD VISIT:   OBJECTIVE:  Note: Objective measures were completed at Evaluation unless otherwise noted.  DIAGNOSTIC FINDINGS:  06/06/24: L Shoulder X-Ray   X-rays of the left shoulder show degenerative spurring of the acromion and  AC joint.  Slight arthritic changes of the glenohumeral joint.  No acute  abnormalities.   PATIENT SURVEYS:  Quick Dash:  QUICK DASH  Please rate your ability do the following activities in the last week by selecting the number below the appropriate response.   Activities Rating  Open a tight or new jar.  3 = Moderate difficulty  Do heavy household chores (e.g., wash walls, floors). 5 = Unable  Carry a shopping bag or briefcase 2 = Mild difficulty  Wash your back. 5 = Unable  Use a knife to cut food. 2 = Mild difficulty  Recreational activities in which you take some force or impact through your arm, shoulder or hand (e.g., golf, hammering, tennis, etc.). 3 = Moderate difficulty  During the past week, to what extent has your arm, shoulder or hand problem interfered with your normal social activities with family, friends, neighbors or groups?  3 = Moderately  During the past week, were you limited in your work or other regular daily activities as a result of your arm, shoulder or hand problem? 2 = Slightly limited  Rate the severity of the following symptoms in the last week: Arm, Shoulder, or hand pain. 3 = Moderate  Rate the severity of the following symptoms in the last week: Tingling (pins and needles) in your arm, shoulder or hand. 3 = Moderate  During the past week, how much difficulty have you had sleeping because of the pain in your arm, shoulder or  hand?  3 = Moderate difficulty   (A QuickDASH score may not be calculated if there is greater than 1 missing item.)  Quick Dash Disability/Symptom Score: 27.3  Minimally Clinically Important Difference (MCID): 15-20 points  Flavio, F. et al. (2013). Minimally clinically important difference of the disabilities of the arm, shoulder, and hand outcome measures (DASH) and its shortened version (Quick DASH). Journal of Orthopaedic & Sports Physical Therapy, 44(1), 30-39)   COGNITION: Overall cognitive status: Within functional limits for tasks assessed     SENSATION: Not tested  POSTURE: Rounded shoulder, forward head  UPPER EXTREMITY ROM:   Active ROM Right eval Left eval  Shoulder flexion 170 140  Shoulder extension    Shoulder abduction 165 55  Shoulder adduction    Shoulder internal rotation  70  Shoulder external rotation  55  Elbow flexion    Elbow extension    Wrist flexion    Wrist extension    Wrist ulnar deviation    Wrist radial deviation    Wrist pronation    Wrist supination    (Blank rows = not tested)  UPPER EXTREMITY MMT:  MMT Right eval Left eval  Shoulder flexion  4+  Shoulder extension    Shoulder abduction  4  Shoulder adduction    Shoulder internal rotation  4-  Shoulder external rotation  4-  Middle trapezius    Lower trapezius    Elbow flexion  Elbow extension    Wrist flexion    Wrist extension    Wrist ulnar deviation    Wrist radial deviation    Wrist pronation    Wrist supination    Grip strength (lbs)    (Blank rows = not tested)    JOINT MOBILITY TESTING:  WFL with PA mobilization                                                                                                                              TREATMENT DATE:   [facility] Adult PT Treatment:                                                DATE: 07/11/2024  Therapeutic Exercise: UBE level 3,  3'/3' (decreased level to 1.5 for last 2 minutes backwards) Pulleys  flex/scaption x1' ea Seated ITYs 1# x10 ea  Neuromuscular re-ed: Rows GTB 2x10 Shoulder ext GTB 2x10 Seated BIL ER GTB 2x10 Seated horizontal abduction GTB 2x10  [facility] Adult PT Treatment:                                                DATE: 07/09/2024  Therapeutic Exercise: UBE level 1 3'/3' while gathering subjective and planning session with patient Pulleys flex/scaption x2' ea Seated ITYs 1# x10 ea  Neuromuscular re-ed: Rows RTB 2x10 Shoulder ext RTB 2x10 Seated BIL ER RTB 2x10 Seated horizontal abduction RTB 2x10  [facility] Adult PT Treatment:                                                DATE: 07/03/24 Therapeutic Exercise: UBE level 1 3'/3' while gathering subjective and planning session with patient Pulleys flex/scaption x2' ea Seated ITYs 1# x10 ea Neuromuscular re-ed: Rows RTB 2x10 Shoulder ext RTB 2x10 Seated BIL ER RTB 2x10 Seated horizontal abduction RTB 2x10     PATIENT EDUCATION: Education details: reviewed initial home exercise program; discussion of POC, prognosis and goals for skilled PT   Person educated: Patient Education method: Explanation, Demonstration, and Handouts Education comprehension: verbalized understanding, returned demonstration, and needs further education  HOME EXERCISE PROGRAM: Access Code: [email or web address] URL: [email or web address] Date: 07/03/2024 Prepared by: Corean Pouch  Exercises - Supine Shoulder Flexion Extension AAROM with Dowel  - 1 x daily - 7 x weekly - 2 sets - 10 reps - 3 sec hold - Standing Shoulder Horizontal Abduction with Resistance  - 1 x daily - 7 x weekly - 2 sets - 10 reps - SEATED Shoulder External Rotation and Scapular Retraction with  Resistance  - 1 x daily - 7 x weekly - 2 sets - 10 reps  ASSESSMENT:  CLINICAL IMPRESSION: Saya was able to tolerate increased resistance with theraband exercises today. She will benefit from exercise progression towards functional activities and independent  exercise program as tolerated.    EVAL: Marianne is a 68 y.o. female who was seen today for physical therapy evaluation and treatment for L shoulder pain with mobility deficits. She is demonstrating decreased L shoulder AROM, diminished L UE MMT, and decreased postural endurance. She has related pain and difficulty with overhead reaching, overhead lifting, heavy lifting, participation in regular exercise program, and performance of ADLs/IADLs. She requires skilled PT services at this time to address relevant deficits and improve overall function.     OBJECTIVE IMPAIRMENTS: decreased activity tolerance, decreased ROM, decreased strength, impaired UE functional use, postural dysfunction, and pain.   ACTIVITY LIMITATIONS: carrying, lifting, and reach over head  PARTICIPATION LIMITATIONS: meal prep, cleaning, laundry, driving, and community activity  PERSONAL FACTORS: Age and 3+ comorbidities: hx of OA, seizures/epilepsy, obesity, type 2 DM, HTN, anemia, depression, RA are also affecting patient's functional outcome.   REHAB POTENTIAL: Fair    CLINICAL DECISION MAKING: Evolving/moderate complexity  EVALUATION COMPLEXITY: Moderate   GOALS: Goals reviewed with patient? YES  SHORT TERM GOALS: Target date: 07/18/2024   Patient will be independent with initial home program at least 3 days/week.  Baseline: provided at eval Goal Status: ONGOING  2.  Patient will demonstrate improved postural awareness for at least 15 minutes while seated without need for cueing from PT.  Baseline: see objective measures Goal Status: INITIAL   3.  Patient will demonstrate improved L shoulder AROM by at least 10-15 degrees flexion  Baseline: see objective measures Goal Status: INITIAL     LONG TERM GOALS: Target date: 08/15/2024    Patient will report improved overall functional ability with quickDASH score of 27.3.  Baseline: 10 or less Goal Status: INITIAL   2.  Patient will demonstrate at least  120 degrees of L shoulder abduction AROM.  Baseline:  Goal status: MET  3.  Patient will demonstrate at least 4+/5 MMT L shoulder  Baseline: see objective measures  Goal status: INITIAL  4.  Patient will report at least 85-90% confidence with return to  independent strength training program for UQ.   Goal Status: INITIAL      PLAN:  PT FREQUENCY: 1-2x/week  PT DURATION: 8 weeks  PLANNED INTERVENTIONS: [id number]- PT Re-evaluation, 97750- Physical Performance Testing, 97110-Therapeutic exercises, 97530- Therapeutic activity, [id number]- Neuromuscular re-education, 97535- Self Care, [id number]- Manual therapy, [id number]- Electrical stimulation (unattended), [phone]- Electrical stimulation (manual), 20560 (1-2 muscles), 20561 (3+ muscles)- Dry Needling, Patient/Family education, Cryotherapy, and Moist heat  PLAN FOR NEXT SESSION: address L shoulder ROM, isometric strengthening, periscapular strengthening, RTC strengthening; otherwise address pain modulation via UBE, manual therapy and modalities as indicated   Marko Molt, PT, DPT  07/11/2024 4:22 PM    Date of referral: 05/17/2024 Referring provider: Donah Laymon PARAS, MD Referring diagnosis? M75.42 (ICD-10-CM) - Subacromial impingement of left shoulder Treatment diagnosis? (if different than referring diagnosis)  Left shoulder pain, unspecified chronicity  What was this (referring dx) caused by? Ongoing Issue  Lysle of Condition: Initial Onset (within last 3 months)   Laterality: Lt  Current Functional Measure Score: DASH 27.3  Objective measurements identify impairments when they are compared to normal values, the uninvolved extremity, and prior level of function.  [x]  Yes  []  No  Objective assessment  of functional ability: Moderate functional limitations   Briefly describe symptoms: L shoulder pain with ROM, overhead reaching and shoulder weakness  How did symptoms start: beginning new weight training program   Average pain  intensity:  Last 24 hours: 5/10  Past week: 10/10  How often does the pt experience symptoms? Frequently  How much have the symptoms interfered with usual daily activities? Moderately  How has condition changed since care began at this facility? NA - initial visit  In general, how is the patients overall health? Fair   BACK PAIN (STarT Back Screening Tool) No   "

## 2024-07-16 ENCOUNTER — Ambulatory Visit

## 2024-07-16 DIAGNOSIS — M25512 Pain in left shoulder: Secondary | ICD-10-CM

## 2024-07-16 NOTE — Therapy (Signed)
 " OUTPATIENT PHYSICAL THERAPY TREATMENT NOTE   Patient Name: Kristin [name] MRN: [id number] DOB:1956/01/24, 68 y.o., female Today's Date: 07/16/2024  END OF SESSION:   PT End of Session - 07/16/24 1450     Visit Number 5    Number of Visits 16    Date for Recertification  08/15/24    Authorization Type [employer] MCR/MCD    Authorization Time Period VL- medical necessity    PT Start Time 1447    PT Stop Time 1525    PT Time Calculation (min) 38 min    Activity Tolerance Patient tolerated treatment well    Behavior During Therapy WFL for tasks assessed/performed            Past Medical History:  Diagnosis Date   Allergy    seasonal   Anemia    Anxiety    Arthritis    Back pain, chronic    Borderline diabetes    Bronchitis    Constipation    current issue   Cough    Depression    Epilepsy (HCC)    GERD (gastroesophageal reflux disease)    Hyperlipidemia    Hypertension    IBS (irritable bowel syndrome)    Joint pain    Obesity    Palpitations    Pneumonia 2016   Pre-diabetes    Renal sclerosis, unspecified    only has 1 kidney   Rheumatoid arthritis (HCC)    Seizures (HCC)    last >15 +  yrs ago- recorded 07-26-2021   Somatization disorder    Tobacco abuse    Past Surgical History:  Procedure Laterality Date   ABDOMINAL HYSTERECTOMY     ANTERIOR HIP REVISION Left 07/25/2016   Procedure: LEFT HIP IRRIGATION AND DEBRIDEMENT, REVISION OF HEAD AND LINER;  Surgeon: Kay CHRISTELLA Cummins, MD;  Location: [facility];  Service: Orthopedics;  Laterality: Left;   APPLICATION OF WOUND VAC Left 12/23/2019   Procedure: APPLICATION OF WOUND VAC;  Surgeon: Cummins Kay CHRISTELLA, MD;  Location: [facility];  Service: Orthopedics;  Laterality: Left;   BREAST EXCISIONAL BIOPSY Right    CHOLECYSTECTOMY     COLONOSCOPY     DEBRIDEMENT AND CLOSURE WOUND Left 07/17/2019   Procedure: Excision of left leg wound with ACell placement and primary closure;  Surgeon: Lowery Estefana RAMAN, DO;  Location: [facility];  Service: Plastics;  Laterality: Left;  60 min   EXCISIONAL TOTAL HIP ARTHROPLASTY WITH ANTIBIOTIC SPACERS Left 12/23/2019   Procedure: LEFT HIP IRRIGATION AND DEBRIDEMENT LEFT HIP WOUND and poly exchange ;  Surgeon: Cummins Kay CHRISTELLA, MD;  Location: [facility];  Service: Orthopedics;  Laterality: Left;   HIP DEBRIDEMENT Left 01/19/2018   Procedure(s) Performed: IRRIGATION AND DEBRIDEMENT LEFT HIP (Left )   INCISION AND DRAINAGE HIP Left 08/03/2016   Procedure: IRRIGATION AND DEBRIDEMENT LEFT HIP, VAC PLACEMENT;  Surgeon: Kay CHRISTELLA Cummins, MD;  Location: [facility];  Service: Orthopedics;  Laterality: Left;   INCISION AND DRAINAGE HIP Left 01/19/2018   Procedure: IRRIGATION AND DEBRIDEMENT LEFT HIP;  Surgeon: Cummins Kay CHRISTELLA, MD;  Location: [facility];  Service: Orthopedics;  Laterality: Left;   kidney stent Right 2002   TONSILLECTOMY Bilateral 12/06/2014   Procedure: TONSILLECTOMY, BILATERAL;  Surgeon: Merilee Kraft, MD;  Location: [facility];  Service: ENT;  Laterality: Bilateral;   TONSILLECTOMY     TOTAL HIP ARTHROPLASTY Left 06/01/2016   Procedure: LEFT TOTAL HIP ARTHROPLASTY ANTERIOR APPROACH;  Surgeon: Kay CHRISTELLA Cummins, MD;  Location: [facility];  Service: Orthopedics;  Laterality: Left;   TUBAL LIGATION     WOUND EXPLORATION Left 01/19/2018   Procedure: WOUND EXPLORATION;  Surgeon: Jerri Kay HERO, MD;  Location: [facility];  Service: Orthopedics;  Laterality: Left;   Patient Active Problem List   Diagnosis Date Noted   Medication monitoring encounter 05/24/2024   Immunization counseling 05/24/2024   MRSA infection 03/31/2024   Medication management 03/26/2024   Presence of left artificial hip joint 12/10/2021   Type 2 diabetes mellitus with obesity 12/09/2021   HTN (hypertension) 12/08/2021   Seizures (HCC) 11/26/2021   Hypercalcemia 10/24/2019   Body mass index 40.0-44.9, adult (HCC) 02/15/2018   Prosthetic joint infection of left hip 08/24/2016   Status post left hip replacement 06/01/2016   Sinus  tachycardia 05/19/2016   Thyroid  nodule 12/25/2014   Infiltrate noted on imaging study    UNSPECIFIED RENAL SCLEROSIS 01/16/2009   GERD, SEVERE 05/25/2007   Morbid obesity (HCC) 04/03/2007   Dyslipidemia 08/31/2006   Former smoker 08/31/2006   HYPERTENSION, BENIGN SYSTEMIC 08/31/2006   CONVULSIONS, SEIZURES, NOS 08/31/2006    PCP: Lorrane Pac, MD   REFERRING PROVIDER:  Donah Laymon PARAS, MD    REFERRING DIAG:  [phone] (ICD-10-CM) - Subacromial impingement of left shoulder  Rotator cuff strengthening     THERAPY DIAG:  Left shoulder pain, unspecified chronicity  Rationale for Evaluation and Treatment: Rehabilitation  ONSET DATE: 05/04/2024  SUBJECTIVE:                                                                                                                                                                                      SUBJECTIVE STATEMENT: Patient reporting that she is working on her HEP 3x/week.   EVAL: She confirms that her Left shoulder began hurting after beginning a weight training program. She did have a cortisone shot from Dr. Jerri, 2 weeks ago, which has been helpful. She states that she was told to take a break from exercising. She would like to return to an exercise routine for her weight loss goals.  Hand dominance: Right  PERTINENT HISTORY: hx of OA, seizures/epilepsy, obesity, type 2 DM, HTN, anemia, depression, RA  PAIN:  Are you having pain?  Yes: NPRS scale: 5/10 current, 10/10 worst Pain location: L shoulder and L UE Pain description: shooting, tingling Aggravating factors: OH lifting, lifting something heavy  Relieving factors: cortisone injection, heat pad,   PRECAUTIONS: None  RED FLAGS: None   WEIGHT BEARING RESTRICTIONS: No  FALLS:  Has patient fallen in last 6 months? No  LIVING ENVIRONMENT: Lives with: lives with their spouse Lives in: House/apartment Stairs: No Has following  equipment at home:  None  OCCUPATION: N/a  PLOF: Independent with household mobility with device, Independent with community mobility with device, and Leisure: crafting  PATIENT GOALS:able to move my arm without pain, learning better way to exercise   NEXT MD VISIT:   OBJECTIVE:  Note: Objective measures were completed at Evaluation unless otherwise noted.  DIAGNOSTIC FINDINGS:  06/06/24: L Shoulder X-Ray   X-rays of the left shoulder show degenerative spurring of the acromion and  AC joint.  Slight arthritic changes of the glenohumeral joint.  No acute  abnormalities.   PATIENT SURVEYS:  Quick Dash:  QUICK DASH  Please rate your ability do the following activities in the last week by selecting the number below the appropriate response.   Activities Rating  Open a tight or new jar.  3 = Moderate difficulty  Do heavy household chores (e.g., wash walls, floors). 5 = Unable  Carry a shopping bag or briefcase 2 = Mild difficulty  Wash your back. 5 = Unable  Use a knife to cut food. 2 = Mild difficulty  Recreational activities in which you take some force or impact through your arm, shoulder or hand (e.g., golf, hammering, tennis, etc.). 3 = Moderate difficulty  During the past week, to what extent has your arm, shoulder or hand problem interfered with your normal social activities with family, friends, neighbors or groups?  3 = Moderately  During the past week, were you limited in your work or other regular daily activities as a result of your arm, shoulder or hand problem? 2 = Slightly limited  Rate the severity of the following symptoms in the last week: Arm, Shoulder, or hand pain. 3 = Moderate  Rate the severity of the following symptoms in the last week: Tingling (pins and needles) in your arm, shoulder or hand. 3 = Moderate  During the past week, how much difficulty have you had sleeping because of the pain in your arm, shoulder or hand?  3 = Moderate difficulty   (A QuickDASH score may not be  calculated if there is greater than 1 missing item.)  Quick Dash Disability/Symptom Score: 27.3  Minimally Clinically Important Difference (MCID): 15-20 points  Flavio, F. et al. (2013). Minimally clinically important difference of the disabilities of the arm, shoulder, and hand outcome measures (DASH) and its shortened version (Quick DASH). Journal of Orthopaedic & Sports Physical Therapy, 44(1), 30-39)   COGNITION: Overall cognitive status: Within functional limits for tasks assessed     SENSATION: Not tested  POSTURE: Rounded shoulder, forward head  UPPER EXTREMITY ROM:   Active ROM Right eval Left eval  Shoulder flexion 170 140  Shoulder extension    Shoulder abduction 165 55  Shoulder adduction    Shoulder internal rotation  70  Shoulder external rotation  55  Elbow flexion    Elbow extension    Wrist flexion    Wrist extension    Wrist ulnar deviation    Wrist radial deviation    Wrist pronation    Wrist supination    (Blank rows = not tested)  UPPER EXTREMITY MMT:  MMT Right eval Left eval  Shoulder flexion  4+  Shoulder extension    Shoulder abduction  4  Shoulder adduction    Shoulder internal rotation  4-  Shoulder external rotation  4-  Middle trapezius    Lower trapezius    Elbow flexion    Elbow extension    Wrist flexion    Wrist extension  Wrist ulnar deviation    Wrist radial deviation    Wrist pronation    Wrist supination    Grip strength (lbs)    (Blank rows = not tested)    JOINT MOBILITY TESTING:  WFL with PA mobilization                                                                                                                              TREATMENT DATE:    [facility] Adult PT Treatment:                                                DATE: 07/16/2024  Therapeutic Exercise: UBE level 3, 3'/3' (decreased level to 1.5 for last 2 minutes backwards) Pulleys flex/scaption x1' ea Seated ITYs 2# x10 ea  Neuromuscular  re-ed: Rows GTB 2x10 Shoulder ext GTB 2x10 Seated BIL ER GTB 2x10 Seated horizontal abduction GTB 2x10 Shoulder flexion YTB 2 x 10  Shoulder D1 Extension YTB 2 x 10   [facility] Adult PT Treatment:                                                DATE: 07/11/2024  Therapeutic Exercise: UBE level 3,  3'/3' (decreased level to 1.5 for last 2 minutes backwards) Pulleys flex/scaption x1' ea Seated ITYs 1# x10 ea  Neuromuscular re-ed: Rows GTB 2x10 Shoulder ext GTB 2x10 Seated BIL ER GTB 2x10 Seated horizontal abduction GTB 2x10  [facility] Adult PT Treatment:                                                DATE: 07/09/2024  Therapeutic Exercise: UBE level 1 3'/3' while gathering subjective and planning session with patient Pulleys flex/scaption x2' ea Seated ITYs 1# x10 ea  Neuromuscular re-ed: Rows RTB 2x10 Shoulder ext RTB 2x10 Seated BIL ER RTB 2x10 Seated horizontal abduction RTB 2x10  [facility] Adult PT Treatment:                                                DATE: 07/03/24 Therapeutic Exercise: UBE level 1 3'/3' while gathering subjective and planning session with patient Pulleys flex/scaption x2' ea Seated ITYs 1# x10 ea Neuromuscular re-ed: Rows RTB 2x10 Shoulder ext RTB 2x10 Seated BIL ER RTB 2x10 Seated horizontal abduction RTB 2x10     PATIENT EDUCATION: Education details: reviewed initial home exercise program; discussion of POC, prognosis and  goals for skilled PT   Person educated: Patient Education method: Explanation, Demonstration, and Handouts Education comprehension: verbalized understanding, returned demonstration, and needs further education  HOME EXERCISE PROGRAM: Access Code: [email or web address] URL: [email or web address] Date: 07/03/2024 Prepared by: Corean Pouch  Exercises - Supine Shoulder Flexion Extension AAROM with Dowel  - 1 x daily - 7 x weekly - 2 sets - 10 reps - 3 sec hold - Standing Shoulder Horizontal Abduction with Resistance  - 1 x  daily - 7 x weekly - 2 sets - 10 reps - SEATED Shoulder External Rotation and Scapular Retraction with Resistance  - 1 x daily - 7 x weekly - 2 sets - 10 reps  ASSESSMENT:  CLINICAL IMPRESSION: 07/16/2024  Kristin [name] was able to tolerate increased resistance with theraband exercises today. She will benefit from exercise progression towards functional activities and independent exercise program as tolerated.    EVAL: Kristin [name] is a 69 y.o. female who was seen today for physical therapy evaluation and treatment for L shoulder pain with mobility deficits. She is demonstrating decreased L shoulder AROM, diminished L UE MMT, and decreased postural endurance. She has related pain and difficulty with overhead reaching, overhead lifting, heavy lifting, participation in regular exercise program, and performance of ADLs/IADLs. She requires skilled PT services at this time to address relevant deficits and improve overall function.     OBJECTIVE IMPAIRMENTS: decreased activity tolerance, decreased ROM, decreased strength, impaired UE functional use, postural dysfunction, and pain.   ACTIVITY LIMITATIONS: carrying, lifting, and reach over head  PARTICIPATION LIMITATIONS: meal prep, cleaning, laundry, driving, and community activity  PERSONAL FACTORS: Age and 3+ comorbidities: hx of OA, seizures/epilepsy, obesity, type 2 DM, HTN, anemia, depression, RA are also affecting patient's functional outcome.   REHAB POTENTIAL: Fair    CLINICAL DECISION MAKING: Evolving/moderate complexity  EVALUATION COMPLEXITY: Moderate   GOALS: Goals reviewed with patient? YES  SHORT TERM GOALS: Target date: 07/18/2024   Patient will be independent with initial home program at least 3 days/week.  Baseline: provided at eval Goal Status: ONGOING  2.  Patient will demonstrate improved postural awareness for at least 15 minutes while seated without need for cueing from PT.  Baseline: see objective measures Goal Status:  INITIAL   3.  Patient will demonstrate improved L shoulder AROM by at least 10-15 degrees flexion  Baseline: see objective measures Goal Status: INITIAL     LONG TERM GOALS: Target date: 08/15/2024    Patient will report improved overall functional ability with quickDASH score of 27.3.  Baseline: 10 or less Goal Status: INITIAL   2.  Patient will demonstrate at least 120 degrees of L shoulder abduction AROM.  Baseline:  Goal status: MET  3.  Patient will demonstrate at least 4+/5 MMT L shoulder  Baseline: see objective measures  Goal status: INITIAL  4.  Patient will report at least 85-90% confidence with return to  independent strength training program for UQ.   Goal Status: INITIAL      PLAN:  PT FREQUENCY: 1-2x/week  PT DURATION: 8 weeks  PLANNED INTERVENTIONS: 02835- PT Re-evaluation, 97750- Physical Performance Testing, 97110-Therapeutic exercises, 97530- Therapeutic activity, [id number]- Neuromuscular re-education, 97535- Self Care, 02859- Manual therapy, [id number]- Electrical stimulation (unattended), [id number]- Electrical stimulation (manual), 20560 (1-2 muscles), 20561 (3+ muscles)- Dry Needling, Patient/Family education, Cryotherapy, and Moist heat  PLAN FOR NEXT SESSION: address L shoulder ROM, isometric strengthening, periscapular strengthening, RTC strengthening; otherwise address pain modulation via UBE, manual therapy and modalities as indicated   Marko Molt,  PT, DPT  07/16/2024 4:58 PM    Date of referral: 05/17/2024 Referring provider: Donah Laymon PARAS, MD Referring diagnosis? M75.42 (ICD-10-CM) - Subacromial impingement of left shoulder Treatment diagnosis? (if different than referring diagnosis)  Left shoulder pain, unspecified chronicity  What was this (referring dx) caused by? Ongoing Issue  Lysle of Condition: Initial Onset (within last 3 months)   Laterality: Lt  Current Functional Measure Score: DASH 27.3  Objective measurements identify  impairments when they are compared to normal values, the uninvolved extremity, and prior level of function.  [x]  Yes  []  No  Objective assessment of functional ability: Moderate functional limitations   Briefly describe symptoms: L shoulder pain with ROM, overhead reaching and shoulder weakness  How did symptoms start: beginning new weight training program   Average pain intensity:  Last 24 hours: 5/10  Past week: 10/10  How often does the pt experience symptoms? Frequently  How much have the symptoms interfered with usual daily activities? Moderately  How has condition changed since care began at this facility? NA - initial visit  In general, how is the patients overall health? Fair   BACK PAIN (STarT Back Screening Tool) No   "

## 2024-07-18 ENCOUNTER — Ambulatory Visit

## 2024-07-18 ENCOUNTER — Other Ambulatory Visit: Payer: Self-pay

## 2024-07-18 DIAGNOSIS — M25512 Pain in left shoulder: Secondary | ICD-10-CM

## 2024-07-18 DIAGNOSIS — K2100 Gastro-esophageal reflux disease with esophagitis, without bleeding: Secondary | ICD-10-CM

## 2024-07-18 NOTE — Telephone Encounter (Signed)
 Chart reviewed, needs f/u labs for hypercalcemia and to discuss possible PPI taper.  -Fairy Amy, MD

## 2024-07-18 NOTE — Therapy (Signed)
 " OUTPATIENT PHYSICAL THERAPY TREATMENT NOTE   Patient Name: Kristin [name] MRN: [id number] DOB:1956/03/31, 69 y.o., female Today's Date: 07/18/2024  END OF SESSION:   PT End of Session - 07/18/24 1540     Visit Number 6    Number of Visits 16    Date for Recertification  08/15/24    Authorization Type [employer] MCR/MCD    Authorization Time Period VL- medical necessity    PT Start Time 1534    PT Stop Time 1612    PT Time Calculation (min) 38 min    Activity Tolerance Patient tolerated treatment well    Behavior During Therapy WFL for tasks assessed/performed             Past Medical History:  Diagnosis Date   Allergy    seasonal   Anemia    Anxiety    Arthritis    Back pain, chronic    Borderline diabetes    Bronchitis    Constipation    current issue   Cough    Depression    Epilepsy (HCC)    GERD (gastroesophageal reflux disease)    Hyperlipidemia    Hypertension    IBS (irritable bowel syndrome)    Joint pain    Obesity    Palpitations    Pneumonia 2016   Pre-diabetes    Renal sclerosis, unspecified    only has 1 kidney   Rheumatoid arthritis (HCC)    Seizures (HCC)    last >15 +  yrs ago- recorded 07-26-2021   Somatization disorder    Tobacco abuse    Past Surgical History:  Procedure Laterality Date   ABDOMINAL HYSTERECTOMY     ANTERIOR HIP REVISION Left 07/25/2016   Procedure: LEFT HIP IRRIGATION AND DEBRIDEMENT, REVISION OF HEAD AND LINER;  Surgeon: Kay CHRISTELLA Cummins, MD;  Location: [facility];  Service: Orthopedics;  Laterality: Left;   APPLICATION OF WOUND VAC Left 12/23/2019   Procedure: APPLICATION OF WOUND VAC;  Surgeon: Cummins Kay CHRISTELLA, MD;  Location: [facility];  Service: Orthopedics;  Laterality: Left;   BREAST EXCISIONAL BIOPSY Right    CHOLECYSTECTOMY     COLONOSCOPY     DEBRIDEMENT AND CLOSURE WOUND Left 07/17/2019   Procedure: Excision of left leg wound with ACell placement and primary closure;  Surgeon: Lowery Estefana RAMAN, DO;  Location: [facility];  Service: Plastics;  Laterality: Left;  60 min   EXCISIONAL TOTAL HIP ARTHROPLASTY WITH ANTIBIOTIC SPACERS Left 12/23/2019   Procedure: LEFT HIP IRRIGATION AND DEBRIDEMENT LEFT HIP WOUND and poly exchange ;  Surgeon: Cummins Kay CHRISTELLA, MD;  Location: [facility];  Service: Orthopedics;  Laterality: Left;   HIP DEBRIDEMENT Left 01/19/2018   Procedure(s) Performed: IRRIGATION AND DEBRIDEMENT LEFT HIP (Left )   INCISION AND DRAINAGE HIP Left 08/03/2016   Procedure: IRRIGATION AND DEBRIDEMENT LEFT HIP, VAC PLACEMENT;  Surgeon: Kay CHRISTELLA Cummins, MD;  Location: [facility];  Service: Orthopedics;  Laterality: Left;   INCISION AND DRAINAGE HIP Left 01/19/2018   Procedure: IRRIGATION AND DEBRIDEMENT LEFT HIP;  Surgeon: Cummins Kay CHRISTELLA, MD;  Location: [facility];  Service: Orthopedics;  Laterality: Left;   kidney stent Right 2002   TONSILLECTOMY Bilateral 12/06/2014   Procedure: TONSILLECTOMY, BILATERAL;  Surgeon: Merilee Kraft, MD;  Location: [facility] OR;  Service: ENT;  Laterality: Bilateral;   TONSILLECTOMY     TOTAL HIP ARTHROPLASTY Left 06/01/2016   Procedure: LEFT TOTAL HIP ARTHROPLASTY ANTERIOR APPROACH;  Surgeon: Kay CHRISTELLA Cummins, MD;  Location: [facility];  Service: Orthopedics;  Laterality: Left;   TUBAL LIGATION     WOUND EXPLORATION Left 01/19/2018   Procedure: WOUND EXPLORATION;  Surgeon: Jerri Kay HERO, MD;  Location: [facility];  Service: Orthopedics;  Laterality: Left;   Patient Active Problem List   Diagnosis Date Noted   Medication monitoring encounter 05/24/2024   Immunization counseling 05/24/2024   MRSA infection 03/31/2024   Medication management 03/26/2024   Presence of left artificial hip joint 12/10/2021   Type 2 diabetes mellitus with obesity 12/09/2021   HTN (hypertension) 12/08/2021   Seizures (HCC) 11/26/2021   Hypercalcemia 10/24/2019   Body mass index 40.0-44.9, adult (HCC) 02/15/2018   Prosthetic joint infection of left hip 08/24/2016   Status post left hip replacement 06/01/2016   Sinus  tachycardia 05/19/2016   Thyroid  nodule 12/25/2014   Infiltrate noted on imaging study    UNSPECIFIED RENAL SCLEROSIS 01/16/2009   GERD, SEVERE 05/25/2007   Morbid obesity (HCC) 04/03/2007   Dyslipidemia 08/31/2006   Former smoker 08/31/2006   HYPERTENSION, BENIGN SYSTEMIC 08/31/2006   CONVULSIONS, SEIZURES, NOS 08/31/2006    PCP: Lorrane Pac, MD   REFERRING PROVIDER:  Donah Laymon PARAS, MD    REFERRING DIAG:  [id number] (ICD-10-CM) - Subacromial impingement of left shoulder  Rotator cuff strengthening     THERAPY DIAG:  Left shoulder pain, unspecified chronicity  Rationale for Evaluation and Treatment: Rehabilitation  ONSET DATE: 05/04/2024  SUBJECTIVE:                                                                                                                                                                                      SUBJECTIVE STATEMENT: Patient reporting some soreness yesterday with her HEP. However, she has minimal pain at start of today's session.   EVAL: She confirms that her Left shoulder began hurting after beginning a weight training program. She did have a cortisone shot from Dr. Jerri, 2 weeks ago, which has been helpful. She states that she was told to take a break from exercising. She would like to return to an exercise routine for her weight loss goals.  Hand dominance: Right  PERTINENT HISTORY: hx of OA, seizures/epilepsy, obesity, type 2 DM, HTN, anemia, depression, RA  PAIN:  Are you having pain?  Yes: NPRS scale: 5/10 current, 10/10 worst Pain location: L shoulder and L UE Pain description: shooting, tingling Aggravating factors: OH lifting, lifting something heavy  Relieving factors: cortisone injection, heat pad,   PRECAUTIONS: None  RED FLAGS: None   WEIGHT BEARING RESTRICTIONS: No  FALLS:  Has patient fallen in last 6 months? No  LIVING ENVIRONMENT: Lives with: lives with their  spouse Lives in: House/apartment Stairs:  No Has following equipment at home: None  OCCUPATION: N/a  PLOF: Independent with household mobility with device, Independent with community mobility with device, and Leisure: crafting  PATIENT GOALS:able to move my arm without pain, learning better way to exercise   NEXT MD VISIT:   OBJECTIVE:  Note: Objective measures were completed at Evaluation unless otherwise noted.  DIAGNOSTIC FINDINGS:  06/06/24: L Shoulder X-Ray   X-rays of the left shoulder show degenerative spurring of the acromion and  AC joint.  Slight arthritic changes of the glenohumeral joint.  No acute  abnormalities.   PATIENT SURVEYS:  Quick Dash:  QUICK DASH  Please rate your ability do the following activities in the last week by selecting the number below the appropriate response.   Activities Rating  Open a tight or new jar.  3 = Moderate difficulty  Do heavy household chores (e.g., wash walls, floors). 5 = Unable  Carry a shopping bag or briefcase 2 = Mild difficulty  Wash your back. 5 = Unable  Use a knife to cut food. 2 = Mild difficulty  Recreational activities in which you take some force or impact through your arm, shoulder or hand (e.g., golf, hammering, tennis, etc.). 3 = Moderate difficulty  During the past week, to what extent has your arm, shoulder or hand problem interfered with your normal social activities with family, friends, neighbors or groups?  3 = Moderately  During the past week, were you limited in your work or other regular daily activities as a result of your arm, shoulder or hand problem? 2 = Slightly limited  Rate the severity of the following symptoms in the last week: Arm, Shoulder, or hand pain. 3 = Moderate  Rate the severity of the following symptoms in the last week: Tingling (pins and needles) in your arm, shoulder or hand. 3 = Moderate  During the past week, how much difficulty have you had sleeping because of the pain in your arm, shoulder or hand?  3 = Moderate  difficulty   (A QuickDASH score may not be calculated if there is greater than 1 missing item.)  Quick Dash Disability/Symptom Score: 27.3  Minimally Clinically Important Difference (MCID): 15-20 points  Flavio, F. et al. (2013). Minimally clinically important difference of the disabilities of the arm, shoulder, and hand outcome measures (DASH) and its shortened version (Quick DASH). Journal of Orthopaedic & Sports Physical Therapy, 44(1), 30-39)   COGNITION: Overall cognitive status: Within functional limits for tasks assessed     SENSATION: Not tested  POSTURE: Rounded shoulder, forward head  UPPER EXTREMITY ROM:   Active ROM Right eval Left eval  Shoulder flexion 170 140  Shoulder extension    Shoulder abduction 165 55  Shoulder adduction    Shoulder internal rotation  70  Shoulder external rotation  55  Elbow flexion    Elbow extension    Wrist flexion    Wrist extension    Wrist ulnar deviation    Wrist radial deviation    Wrist pronation    Wrist supination    (Blank rows = not tested)  UPPER EXTREMITY MMT:  MMT Right eval Left eval  Shoulder flexion  4+  Shoulder extension    Shoulder abduction  4  Shoulder adduction    Shoulder internal rotation  4-  Shoulder external rotation  4-  Middle trapezius    Lower trapezius    Elbow flexion    Elbow extension  Wrist flexion    Wrist extension    Wrist ulnar deviation    Wrist radial deviation    Wrist pronation    Wrist supination    Grip strength (lbs)    (Blank rows = not tested)    JOINT MOBILITY TESTING:  WFL with PA mobilization                                                                                                                              TREATMENT DATE:   [facility] Adult PT Treatment:                                                DATE: 07/18/2024  Therapeutic Exercise: UBE level 3, 3'/3' (decreased level to 1.5 for last 2 minutes backwards) Seated ITYs 3# x10  ea  Neuromuscular re-ed: Rows GTB 2x15 Shoulder ext GTB 2x15 Seated BIL ER GTB 2x10 Seated horizontal abduction GTB 2x10 Shoulder flexion YTB 2 x 10  Shoulder D1 Extension RTB 2 x 10 both handles Consider overhead press, chest press at next visit   [facility] Adult PT Treatment:                                                DATE: 07/16/2024  Therapeutic Exercise: UBE level 3, 3'/3' (decreased level to 1.5 for last 2 minutes backwards) Pulleys flex/scaption x1' ea Seated ITYs 2# x10 ea  Neuromuscular re-ed: Rows GTB 2x10 Shoulder ext GTB 2x10 Seated BIL ER GTB 2x10 Seated horizontal abduction GTB 2x10 Shoulder flexion YTB 2 x 10  Shoulder D1 Extension YTB 2 x 10   [facility] Adult PT Treatment:                                                DATE: 07/11/2024  Therapeutic Exercise: UBE level 3,  3'/3' (decreased level to 1.5 for last 2 minutes backwards) Pulleys flex/scaption x1' ea Seated ITYs 1# x10 ea  Neuromuscular re-ed: Rows GTB 2x10 Shoulder ext GTB 2x10 Seated BIL ER GTB 2x10 Seated horizontal abduction GTB 2x10  [facility] Adult PT Treatment:                                                DATE: 07/09/2024  Therapeutic Exercise: UBE level 1 3'/3' while gathering subjective and planning session with patient Pulleys flex/scaption x2' ea Seated ITYs 1# x10 ea  Neuromuscular re-ed: Rows RTB  2x10 Shoulder ext RTB 2x10 Seated BIL ER RTB 2x10 Seated horizontal abduction RTB 2x10      PATIENT EDUCATION: Education details: reviewed initial home exercise program; discussion of POC, prognosis and goals for skilled PT   Person educated: Patient Education method: Explanation, Demonstration, and Handouts Education comprehension: verbalized understanding, returned demonstration, and needs further education  HOME EXERCISE PROGRAM: Access Code: [email or web address] URL: [name] Date: 07/18/2024 Prepared by: Marko Molt  Exercises - Standing Shoulder Horizontal  Abduction with Resistance  - 1 x daily - 3-4 x weekly - 2 sets - 10-15 reps - SEATED Shoulder External Rotation and Scapular Retraction with Resistance  - 1 x daily - 3-4 x weekly - 2 sets - 10-15 reps - Standing Shoulder Row with Anchored Resistance  - 1 x daily - 3-4 x weekly - 2 sets - 10-15 reps - Shoulder extension with resistance - Neutral  - 1 x daily - 3-4 x weekly - 2 sets - 10-15 reps - Standing Shoulder Single Arm PNF D2 Extension with Anchored Resistance  - 1 x daily - 3-4 x weekly - 2 sets - 10-15 reps  ASSESSMENT:  CLINICAL IMPRESSION: 07/18/2024  Kristin [name] had fair tolerance of today's exercises, including progression of resistance training program. Decreased shoulder mobility time today d/t improved pain severity. Plan to continue with progression towards independent UE strengthening program.     EVAL: Kristin [name] is a 68 y.o. female who was seen today for physical therapy evaluation and treatment for L shoulder pain with mobility deficits. She is demonstrating decreased L shoulder AROM, diminished L UE MMT, and decreased postural endurance. She has related pain and difficulty with overhead reaching, overhead lifting, heavy lifting, participation in regular exercise program, and performance of ADLs/IADLs. She requires skilled PT services at this time to address relevant deficits and improve overall function.     OBJECTIVE IMPAIRMENTS: decreased activity tolerance, decreased ROM, decreased strength, impaired UE functional use, postural dysfunction, and pain.   ACTIVITY LIMITATIONS: carrying, lifting, and reach over head  PARTICIPATION LIMITATIONS: meal prep, cleaning, laundry, driving, and community activity  PERSONAL FACTORS: Age and 3+ comorbidities: hx of OA, seizures/epilepsy, obesity, type 2 DM, HTN, anemia, depression, RA are also affecting patient's functional outcome.   REHAB POTENTIAL: Fair    CLINICAL DECISION MAKING: Evolving/moderate complexity  EVALUATION COMPLEXITY:  Moderate   GOALS: Goals reviewed with patient? YES  SHORT TERM GOALS: Target date: 07/18/2024   Patient will be independent with initial home program at least 3 days/week.  Baseline: provided at eval Goal Status: ONGOING  2.  Patient will demonstrate improved postural awareness for at least 15 minutes while seated without need for cueing from PT.  Baseline: see objective measures Goal Status: INITIAL   3.  Patient will demonstrate improved L shoulder AROM by at least 10-15 degrees flexion  Baseline: see objective measures Goal Status: INITIAL     LONG TERM GOALS: Target date: 08/15/2024    Patient will report improved overall functional ability with quickDASH score of 27.3.  Baseline: 10 or less Goal Status: INITIAL   2.  Patient will demonstrate at least 120 degrees of L shoulder abduction AROM.  Baseline:  Goal status: MET  3.  Patient will demonstrate at least 4+/5 MMT L shoulder  Baseline: see objective measures  Goal status: INITIAL  4.  Patient will report at least 85-90% confidence with return to independent strength training program for UQ.   Goal Status: INITIAL      PLAN:  PT FREQUENCY:  1-2x/week  PT DURATION: 8 weeks  PLANNED INTERVENTIONS: 97164- PT Re-evaluation, 97750- Physical Performance Testing, 97110-Therapeutic exercises, 97530- Therapeutic activity, [id number]- Neuromuscular re-education, 97535- Self Care, [id number]- Manual therapy, [id number]- Electrical stimulation (unattended), [phone]- Electrical stimulation (manual), 20560 (1-2 muscles), 20561 (3+ muscles)- Dry Needling, Patient/Family education, Cryotherapy, and Moist heat  PLAN FOR NEXT SESSION: address L shoulder ROM, isometric strengthening, periscapular strengthening, RTC strengthening; otherwise address pain modulation via UBE, manual therapy and modalities as indicated   Marko Molt, PT, DPT  07/18/2024 4:14 PM    Date of referral: 05/17/2024 Referring provider: Donah Laymon PARAS,  MD Referring diagnosis? M75.42 (ICD-10-CM) - Subacromial impingement of left shoulder Treatment diagnosis? (if different than referring diagnosis)  Left shoulder pain, unspecified chronicity  What was this (referring dx) caused by? Ongoing Issue  Lysle of Condition: Initial Onset (within last 3 months)   Laterality: Lt  Current Functional Measure Score: DASH 27.3  Objective measurements identify impairments when they are compared to normal values, the uninvolved extremity, and prior level of function.  [x]  Yes  []  No  Objective assessment of functional ability: Moderate functional limitations   Briefly describe symptoms: L shoulder pain with ROM, overhead reaching and shoulder weakness  How did symptoms start: beginning new weight training program   Average pain intensity:  Last 24 hours: 5/10  Past week: 10/10  How often does the pt experience symptoms? Frequently  How much have the symptoms interfered with usual daily activities? Moderately  How has condition changed since care began at this facility? NA - initial visit  In general, how is the patients overall health? Fair   BACK PAIN (STarT Back Screening Tool) No   "

## 2024-07-24 ENCOUNTER — Ambulatory Visit

## 2024-07-24 DIAGNOSIS — M25512 Pain in left shoulder: Secondary | ICD-10-CM

## 2024-07-24 DIAGNOSIS — M6281 Muscle weakness (generalized): Secondary | ICD-10-CM

## 2024-07-24 DIAGNOSIS — M25552 Pain in left hip: Secondary | ICD-10-CM

## 2024-07-24 DIAGNOSIS — Z96649 Presence of unspecified artificial hip joint: Secondary | ICD-10-CM

## 2024-07-24 NOTE — Therapy (Signed)
 " OUTPATIENT PHYSICAL THERAPY TREATMENT NOTE   Patient Name: Kristin [name] MRN: [id number] DOB:Jun 12, 1956, 69 y.o., female Today's Date: 07/24/2024  END OF SESSION:   PT End of Session - 07/24/24 1156     Visit Number 7    Number of Visits 16    Date for Recertification  08/15/24    Authorization Type [employer] MCR/MCD    Authorization Time Period VL- medical necessity    PT Start Time 1151    PT Stop Time 1235    PT Time Calculation (min) 44 min    Activity Tolerance Patient tolerated treatment well    Behavior During Therapy WFL for tasks assessed/performed              Past Medical History:  Diagnosis Date   Allergy    seasonal   Anemia    Anxiety    Arthritis    Back pain, chronic    Borderline diabetes    Bronchitis    Constipation    current issue   Cough    Depression    Epilepsy (HCC)    GERD (gastroesophageal reflux disease)    Hyperlipidemia    Hypertension    IBS (irritable bowel syndrome)    Joint pain    Obesity    Palpitations    Pneumonia 2016   Pre-diabetes    Renal sclerosis, unspecified    only has 1 kidney   Rheumatoid arthritis (HCC)    Seizures (HCC)    last >15 +  yrs ago- recorded 07-26-2021   Somatization disorder    Tobacco abuse    Past Surgical History:  Procedure Laterality Date   ABDOMINAL HYSTERECTOMY     ANTERIOR HIP REVISION Left 07/25/2016   Procedure: LEFT HIP IRRIGATION AND DEBRIDEMENT, REVISION OF HEAD AND LINER;  Surgeon: Kay CHRISTELLA Cummins, MD;  Location: [facility];  Service: Orthopedics;  Laterality: Left;   APPLICATION OF WOUND VAC Left 12/23/2019   Procedure: APPLICATION OF WOUND VAC;  Surgeon: Cummins Kay CHRISTELLA, MD;  Location: [facility];  Service: Orthopedics;  Laterality: Left;   BREAST EXCISIONAL BIOPSY Right    CHOLECYSTECTOMY     COLONOSCOPY     DEBRIDEMENT AND CLOSURE WOUND Left 07/17/2019   Procedure: Excision of left leg wound with ACell placement and primary closure;  Surgeon: Lowery Estefana RAMAN, DO;  Location: [facility];  Service: Plastics;  Laterality: Left;  60 min   EXCISIONAL TOTAL HIP ARTHROPLASTY WITH ANTIBIOTIC SPACERS Left 12/23/2019   Procedure: LEFT HIP IRRIGATION AND DEBRIDEMENT LEFT HIP WOUND and poly exchange ;  Surgeon: Cummins Kay CHRISTELLA, MD;  Location: [facility];  Service: Orthopedics;  Laterality: Left;   HIP DEBRIDEMENT Left 01/19/2018   Procedure(s) Performed: IRRIGATION AND DEBRIDEMENT LEFT HIP (Left )   INCISION AND DRAINAGE HIP Left 08/03/2016   Procedure: IRRIGATION AND DEBRIDEMENT LEFT HIP, VAC PLACEMENT;  Surgeon: Kay CHRISTELLA Cummins, MD;  Location: [facility];  Service: Orthopedics;  Laterality: Left;   INCISION AND DRAINAGE HIP Left 01/19/2018   Procedure: IRRIGATION AND DEBRIDEMENT LEFT HIP;  Surgeon: Cummins Kay CHRISTELLA, MD;  Location: [facility];  Service: Orthopedics;  Laterality: Left;   kidney stent Right 2002   TONSILLECTOMY Bilateral 12/06/2014   Procedure: TONSILLECTOMY, BILATERAL;  Surgeon: Merilee Kraft, MD;  Location: [facility] OR;  Service: ENT;  Laterality: Bilateral;   TONSILLECTOMY     TOTAL HIP ARTHROPLASTY Left 06/01/2016   Procedure: LEFT TOTAL HIP ARTHROPLASTY ANTERIOR APPROACH;  Surgeon: Kay CHRISTELLA Cummins,  MD;  Location: [facility];  Service: Orthopedics;  Laterality: Left;   TUBAL LIGATION     WOUND EXPLORATION Left 01/19/2018   Procedure: WOUND EXPLORATION;  Surgeon: Jerri Kay HERO, MD;  Location: [facility];  Service: Orthopedics;  Laterality: Left;   Patient Active Problem List   Diagnosis Date Noted   Medication monitoring encounter 05/24/2024   Immunization counseling 05/24/2024   MRSA infection 03/31/2024   Medication management 03/26/2024   Presence of left artificial hip joint 12/10/2021   Type 2 diabetes mellitus with obesity 12/09/2021   HTN (hypertension) 12/08/2021   Seizures (HCC) 11/26/2021   Hypercalcemia 10/24/2019   Body mass index 40.0-44.9, adult (HCC) 02/15/2018   Prosthetic joint infection of left hip 08/24/2016   Status post left hip replacement 06/01/2016   Sinus  tachycardia 05/19/2016   Thyroid  nodule 12/25/2014   Infiltrate noted on imaging study    UNSPECIFIED RENAL SCLEROSIS 01/16/2009   GERD, SEVERE 05/25/2007   Morbid obesity (HCC) 04/03/2007   Dyslipidemia 08/31/2006   Former smoker 08/31/2006   HYPERTENSION, BENIGN SYSTEMIC 08/31/2006   CONVULSIONS, SEIZURES, NOS 08/31/2006    PCP: Lorrane Pac, MD   REFERRING PROVIDER:  Donah Laymon PARAS, MD    REFERRING DIAG:  [phone] (ICD-10-CM) - Subacromial impingement of left shoulder  Rotator cuff strengthening     THERAPY DIAG:  Left shoulder pain, unspecified chronicity  History of revision of total hip arthroplasty  Pain in left hip  Muscle weakness (generalized)  Rationale for Evaluation and Treatment: Rehabilitation  ONSET DATE: 05/04/2024  SUBJECTIVE:                                                                                                                                                                                      SUBJECTIVE STATEMENT: Patient reports her L shoulder is doing better. Pt offered no concerns.  EVAL: She confirms that her Left shoulder began hurting after beginning a weight training program. She did have a cortisone shot from Dr. Jerri, 2 weeks ago, which has been helpful. She states that she was told to take a break from exercising. She would like to return to an exercise routine for her weight loss goals.  Hand dominance: Right  PERTINENT HISTORY: hx of OA, seizures/epilepsy, obesity, type 2 DM, HTN, anemia, depression, RA  PAIN:  Are you having pain?  Yes: NPRS scale: 0/10 current, 10/10 worst Pain location: L shoulder and L UE Pain description: shooting, tingling Aggravating factors: OH lifting, lifting something heavy  Relieving factors: cortisone injection, heat pad,   PRECAUTIONS: None  RED FLAGS: None   WEIGHT BEARING RESTRICTIONS: No  FALLS:  Has patient fallen in  last 6 months? No  LIVING ENVIRONMENT: Lives with:  lives with their spouse Lives in: House/apartment Stairs: No Has following equipment at home: None  OCCUPATION: N/a  PLOF: Independent with household mobility with device, Independent with community mobility with device, and Leisure: crafting  PATIENT GOALS:able to move my arm without pain, learning better way to exercise   NEXT MD VISIT:   OBJECTIVE:  Note: Objective measures were completed at Evaluation unless otherwise noted.  DIAGNOSTIC FINDINGS:  06/06/24: L Shoulder X-Ray   X-rays of the left shoulder show degenerative spurring of the acromion and  AC joint.  Slight arthritic changes of the glenohumeral joint.  No acute  abnormalities.   PATIENT SURVEYS:  Quick Dash:  QUICK DASH  Please rate your ability do the following activities in the last week by selecting the number below the appropriate response.   Activities Rating  Open a tight or new jar.  3 = Moderate difficulty  Do heavy household chores (e.g., wash walls, floors). 5 = Unable  Carry a shopping bag or briefcase 2 = Mild difficulty  Wash your back. 5 = Unable  Use a knife to cut food. 2 = Mild difficulty  Recreational activities in which you take some force or impact through your arm, shoulder or hand (e.g., golf, hammering, tennis, etc.). 3 = Moderate difficulty  During the past week, to what extent has your arm, shoulder or hand problem interfered with your normal social activities with family, friends, neighbors or groups?  3 = Moderately  During the past week, were you limited in your work or other regular daily activities as a result of your arm, shoulder or hand problem? 2 = Slightly limited  Rate the severity of the following symptoms in the last week: Arm, Shoulder, or hand pain. 3 = Moderate  Rate the severity of the following symptoms in the last week: Tingling (pins and needles) in your arm, shoulder or hand. 3 = Moderate  During the past week, how much difficulty have you had sleeping because of  the pain in your arm, shoulder or hand?  3 = Moderate difficulty   (A QuickDASH score may not be calculated if there is greater than 1 missing item.)  Quick Dash Disability/Symptom Score: 27.3  Minimally Clinically Important Difference (MCID): 15-20 points  Flavio, F. et al. (2013). Minimally clinically important difference of the disabilities of the arm, shoulder, and hand outcome measures (DASH) and its shortened version (Quick DASH). Journal of Orthopaedic & Sports Physical Therapy, 44(1), 30-39)   COGNITION: Overall cognitive status: Within functional limits for tasks assessed     SENSATION: Not tested  POSTURE: Rounded shoulder, forward head  UPPER EXTREMITY ROM:   Active ROM Right eval Left eval  Shoulder flexion 170 140  Shoulder extension    Shoulder abduction 165 55  Shoulder adduction    Shoulder internal rotation  70  Shoulder external rotation  55  Elbow flexion    Elbow extension    Wrist flexion    Wrist extension    Wrist ulnar deviation    Wrist radial deviation    Wrist pronation    Wrist supination    (Blank rows = not tested)  UPPER EXTREMITY MMT:  MMT Right eval Left eval  Shoulder flexion  4+  Shoulder extension    Shoulder abduction  4  Shoulder adduction    Shoulder internal rotation  4-  Shoulder external rotation  4-  Middle trapezius    Lower trapezius  Elbow flexion    Elbow extension    Wrist flexion    Wrist extension    Wrist ulnar deviation    Wrist radial deviation    Wrist pronation    Wrist supination    Grip strength (lbs)    (Blank rows = not tested)    JOINT MOBILITY TESTING:  WFL with PA mobilization                                                                                                                              TREATMENT DATE:  [facility] Adult PT Treatment:                                                DATE: 07/24/24 Therapeutic Exercise: UBE level 3, 3'/3' Seated ITYs 3# x10  ea Neuromuscular re-ed: Rows GTB 2x15 Shoulder ext GTB 2x15 Seated BIL ER GTB 2x10 Seated horizontal abduction GTB 2x10 Shoulder flexion YTB 2 x 10  Shoulder D1 Extension GTB 2 x 10 both handles Overhead press 2x10 3# Chest press 2x10 5#  [facility] Adult PT Treatment:                                                DATE: 07/18/2024   Therapeutic Exercise: UBE level 3, 3'/3' (decreased level to 1.5 for last 2 minutes backwards) Seated ITYs 3# x10 ea   Neuromuscular re-ed: Rows GTB 2x15 Shoulder ext GTB 2x15 Seated BIL ER GTB 2x10 Seated horizontal abduction GTB 2x10 Shoulder flexion YTB 2 x 10  Shoulder D1 Extension RTB 2 x 10 both handles Consider overhead press, chest press at next visit   [facility] Adult PT Treatment:                                                DATE: 07/16/2024  Therapeutic Exercise: UBE level 3, 3'/3' (decreased level to 1.5 for last 2 minutes backwards) Pulleys flex/scaption x1' ea Seated ITYs 2# x10 ea  Neuromuscular re-ed: Rows GTB 2x10 Shoulder ext GTB 2x10 Seated BIL ER GTB 2x10 Seated horizontal abduction GTB 2x10 Shoulder flexion YTB 2 x 10  Shoulder D1 Extension YTB 2 x 10   [facility] Adult PT Treatment:                                                DATE: 07/11/2024  Therapeutic Exercise: UBE level 3,  3'/3' (decreased level  to 1.5 for last 2 minutes backwards) Pulleys flex/scaption x1' ea Seated ITYs 1# x10 ea  Neuromuscular re-ed: Rows GTB 2x10 Shoulder ext GTB 2x10 Seated BIL ER GTB 2x10 Seated horizontal abduction GTB 2x10  [facility] Adult PT Treatment:                                                DATE: 07/09/2024  Therapeutic Exercise: UBE level 1 3'/3' while gathering subjective and planning session with patient Pulleys flex/scaption x2' ea Seated ITYs 1# x10 ea  Neuromuscular re-ed: Rows RTB 2x10 Shoulder ext RTB 2x10 Seated BIL ER RTB 2x10 Seated horizontal abduction RTB 2x10      PATIENT EDUCATION: Education details: reviewed  initial home exercise program; discussion of POC, prognosis and goals for skilled PT   Person educated: Patient Education method: Explanation, Demonstration, and Handouts Education comprehension: verbalized understanding, returned demonstration, and needs further education  HOME EXERCISE PROGRAM: Access Code: [email or web address] URL: [email or web address] Date: 07/18/2024 Prepared by: Marko Molt  Exercises - Standing Shoulder Horizontal Abduction with Resistance  - 1 x daily - 3-4 x weekly - 2 sets - 10-15 reps - SEATED Shoulder External Rotation and Scapular Retraction with Resistance  - 1 x daily - 3-4 x weekly - 2 sets - 10-15 reps - Standing Shoulder Row with Anchored Resistance  - 1 x daily - 3-4 x weekly - 2 sets - 10-15 reps - Shoulder extension with resistance - Neutral  - 1 x daily - 3-4 x weekly - 2 sets - 10-15 reps - Standing Shoulder Single Arm PNF D2 Extension with Anchored Resistance  - 1 x daily - 3-4 x weekly - 2 sets - 10-15 reps  ASSESSMENT:  CLINICAL IMPRESSION: 07/24/2024  PT was completed PT for shoulder RC and periscapular strengthening c min progression in demand c chest and OH presses completed today. Pt tolerated PT today without increase in L shoulder pain.    EVAL: Debralee is a 69 y.o. female who was seen today for physical therapy evaluation and treatment for L shoulder pain with mobility deficits. She is demonstrating decreased L shoulder AROM, diminished L UE MMT, and decreased postural endurance. She has related pain and difficulty with overhead reaching, overhead lifting, heavy lifting, participation in regular exercise program, and performance of ADLs/IADLs. She requires skilled PT services at this time to address relevant deficits and improve overall function.     OBJECTIVE IMPAIRMENTS: decreased activity tolerance, decreased ROM, decreased strength, impaired UE functional use, postural dysfunction, and pain.   ACTIVITY LIMITATIONS: carrying,  lifting, and reach over head  PARTICIPATION LIMITATIONS: meal prep, cleaning, laundry, driving, and community activity  PERSONAL FACTORS: Age and 3+ comorbidities: hx of OA, seizures/epilepsy, obesity, type 2 DM, HTN, anemia, depression, RA are also affecting patient's functional outcome.   REHAB POTENTIAL: Fair    CLINICAL DECISION MAKING: Evolving/moderate complexity  EVALUATION COMPLEXITY: Moderate   GOALS: Goals reviewed with patient? YES  SHORT TERM GOALS: Target date: 07/18/2024   Patient will be independent with initial home program at least 3 days/week.  Baseline: provided at eval Goal Status: ONGOING  2.  Patient will demonstrate improved postural awareness for at least 15 minutes while seated without need for cueing from PT.  Baseline: see objective measures Goal Status: INITIAL   3.  Patient will demonstrate improved L shoulder AROM by at least 10-15 degrees  flexion  Baseline: see objective measures Goal Status: INITIAL     LONG TERM GOALS: Target date: 08/15/2024    Patient will report improved overall functional ability with quickDASH score of 27.3.  Baseline: 10 or less Goal Status: INITIAL   2.  Patient will demonstrate at least 120 degrees of L shoulder abduction AROM.  Baseline:  Goal status: MET  3.  Patient will demonstrate at least 4+/5 MMT L shoulder  Baseline: see objective measures  Goal status: INITIAL  4.  Patient will report at least 85-90% confidence with return to independent strength training program for UQ.   Goal Status: INITIAL      PLAN:  PT FREQUENCY: 1-2x/week  PT DURATION: 8 weeks  PLANNED INTERVENTIONS: 02835- PT Re-evaluation, 97750- Physical Performance Testing, 97110-Therapeutic exercises, 97530- Therapeutic activity, [id number]- Neuromuscular re-education, 97535- Self Care, [id number]- Manual therapy, [id number]- Electrical stimulation (unattended), [phone]- Electrical stimulation (manual), 20560 (1-2 muscles), 20561 (3+ muscles)- Dry  Needling, Patient/Family education, Cryotherapy, and Moist heat  PLAN FOR NEXT SESSION: address L shoulder ROM, isometric strengthening, periscapular strengthening, RTC strengthening; otherwise address pain modulation via UBE, manual therapy and modalities as indicated   [employer] MS, PT 07/24/24 1:30 PM    "

## 2024-07-25 NOTE — Therapy (Incomplete)
 " OUTPATIENT PHYSICAL THERAPY TREATMENT NOTE   Patient Name: Kristin [name] MRN: [id number] DOB:11-Feb-1956, 69 y.o., female Today's Date: 07/25/2024  END OF SESSION:         Past Medical History:  Diagnosis Date   Allergy    seasonal   Anemia    Anxiety    Arthritis    Back pain, chronic    Borderline diabetes    Bronchitis    Constipation    current issue   Cough    Depression    Epilepsy (HCC)    GERD (gastroesophageal reflux disease)    Hyperlipidemia    Hypertension    IBS (irritable bowel syndrome)    Joint pain    Obesity    Palpitations    Pneumonia 2016   Pre-diabetes    Renal sclerosis, unspecified    only has 1 kidney   Rheumatoid arthritis (HCC)    Seizures (HCC)    last >15 +  yrs ago- recorded 07-26-2021   Somatization disorder    Tobacco abuse    Past Surgical History:  Procedure Laterality Date   ABDOMINAL HYSTERECTOMY     ANTERIOR HIP REVISION Left 07/25/2016   Procedure: LEFT HIP IRRIGATION AND DEBRIDEMENT, REVISION OF HEAD AND LINER;  Surgeon: Kay CHRISTELLA Cummins, MD;  Location: [facility] OR;  Service: Orthopedics;  Laterality: Left;   APPLICATION OF WOUND VAC Left 12/23/2019   Procedure: APPLICATION OF WOUND VAC;  Surgeon: Cummins Kay CHRISTELLA, MD;  Location: [facility] OR;  Service: Orthopedics;  Laterality: Left;   BREAST EXCISIONAL BIOPSY Right    CHOLECYSTECTOMY     COLONOSCOPY     DEBRIDEMENT AND CLOSURE WOUND Left 07/17/2019   Procedure: Excision of left leg wound with ACell placement and primary closure;  Surgeon: Lowery Estefana RAMAN, DO;  Location: [facility];  Service: Plastics;  Laterality: Left;  60 min   EXCISIONAL TOTAL HIP ARTHROPLASTY WITH ANTIBIOTIC SPACERS Left 12/23/2019   Procedure: LEFT HIP IRRIGATION AND DEBRIDEMENT LEFT HIP WOUND and poly exchange ;  Surgeon: Cummins Kay CHRISTELLA, MD;  Location: [facility];  Service: Orthopedics;  Laterality: Left;   HIP DEBRIDEMENT Left 01/19/2018   Procedure(s) Performed: IRRIGATION AND DEBRIDEMENT LEFT HIP  (Left )   INCISION AND DRAINAGE HIP Left 08/03/2016   Procedure: IRRIGATION AND DEBRIDEMENT LEFT HIP, VAC PLACEMENT;  Surgeon: Kay CHRISTELLA Cummins, MD;  Location: [facility];  Service: Orthopedics;  Laterality: Left;   INCISION AND DRAINAGE HIP Left 01/19/2018   Procedure: IRRIGATION AND DEBRIDEMENT LEFT HIP;  Surgeon: Cummins Kay CHRISTELLA, MD;  Location: [facility];  Service: Orthopedics;  Laterality: Left;   kidney stent Right 2002   TONSILLECTOMY Bilateral 12/06/2014   Procedure: TONSILLECTOMY, BILATERAL;  Surgeon: Merilee Kraft, MD;  Location: [facility] OR;  Service: ENT;  Laterality: Bilateral;   TONSILLECTOMY     TOTAL HIP ARTHROPLASTY Left 06/01/2016   Procedure: LEFT TOTAL HIP ARTHROPLASTY ANTERIOR APPROACH;  Surgeon: Kay CHRISTELLA Cummins, MD;  Location: [facility];  Service: Orthopedics;  Laterality: Left;   TUBAL LIGATION     WOUND EXPLORATION Left 01/19/2018   Procedure: WOUND EXPLORATION;  Surgeon: Cummins Kay CHRISTELLA, MD;  Location: [facility];  Service: Orthopedics;  Laterality: Left;   Patient Active Problem List   Diagnosis Date Noted   Medication monitoring encounter 05/24/2024   Immunization counseling 05/24/2024   MRSA infection 03/31/2024   Medication management 03/26/2024   Presence of left artificial hip joint 12/10/2021   Type 2 diabetes mellitus with obesity 12/09/2021  HTN (hypertension) 12/08/2021   Seizures (HCC) 11/26/2021   Hypercalcemia 10/24/2019   Body mass index 40.0-44.9, adult (HCC) 02/15/2018   Prosthetic joint infection of left hip 08/24/2016   Status post left hip replacement 06/01/2016   Sinus tachycardia 05/19/2016   Thyroid  nodule 12/25/2014   Infiltrate noted on imaging study    UNSPECIFIED RENAL SCLEROSIS 01/16/2009   GERD, SEVERE 05/25/2007   Morbid obesity (HCC) 04/03/2007   Dyslipidemia 08/31/2006   Former smoker 08/31/2006   HYPERTENSION, BENIGN SYSTEMIC 08/31/2006   CONVULSIONS, SEIZURES, NOS 08/31/2006    PCP: Lorrane Pac, MD   REFERRING PROVIDER:  Donah Laymon PARAS, MD     REFERRING DIAG:  [phone] (ICD-10-CM) - Subacromial impingement of left shoulder  Rotator cuff strengthening     THERAPY DIAG:  No diagnosis found.  Rationale for Evaluation and Treatment: Rehabilitation  ONSET DATE: 05/04/2024  SUBJECTIVE:                                                                                                                                                                                      SUBJECTIVE STATEMENT: Patient reports her L shoulder is doing better. Pt offered no concerns.  EVAL: She confirms that her Left shoulder began hurting after beginning a weight training program. She did have a cortisone shot from Dr. Jerri, 2 weeks ago, which has been helpful. She states that she was told to take a break from exercising. She would like to return to an exercise routine for her weight loss goals.  Hand dominance: Right  PERTINENT HISTORY: hx of OA, seizures/epilepsy, obesity, type 2 DM, HTN, anemia, depression, RA  PAIN:  Are you having pain?  Yes: NPRS scale: 0/10 current, 10/10 worst Pain location: L shoulder and L UE Pain description: shooting, tingling Aggravating factors: OH lifting, lifting something heavy  Relieving factors: cortisone injection, heat pad,   PRECAUTIONS: None  RED FLAGS: None   WEIGHT BEARING RESTRICTIONS: No  FALLS:  Has patient fallen in last 6 months? No  LIVING ENVIRONMENT: Lives with: lives with their spouse Lives in: House/apartment Stairs: No Has following equipment at home: None  OCCUPATION: N/a  PLOF: Independent with household mobility with device, Independent with community mobility with device, and Leisure: crafting  PATIENT GOALS:able to move my arm without pain, learning better way to exercise   NEXT MD VISIT:   OBJECTIVE:  Note: Objective measures were completed at Evaluation unless otherwise noted.  DIAGNOSTIC FINDINGS:  06/06/24: L Shoulder X-Ray   X-rays of the left shoulder show  degenerative spurring of the acromion and  AC joint.  Slight arthritic changes of the glenohumeral joint.  No acute  abnormalities.   PATIENT SURVEYS:  Quick Dash:  QUICK DASH  Please rate your ability do the following activities in the last week by selecting the number below the appropriate response.   Activities Rating  Open a tight or new jar.  3 = Moderate difficulty  Do heavy household chores (e.g., wash walls, floors). 5 = Unable  Carry a shopping bag or briefcase 2 = Mild difficulty  Wash your back. 5 = Unable  Use a knife to cut food. 2 = Mild difficulty  Recreational activities in which you take some force or impact through your arm, shoulder or hand (e.g., golf, hammering, tennis, etc.). 3 = Moderate difficulty  During the past week, to what extent has your arm, shoulder or hand problem interfered with your normal social activities with family, friends, neighbors or groups?  3 = Moderately  During the past week, were you limited in your work or other regular daily activities as a result of your arm, shoulder or hand problem? 2 = Slightly limited  Rate the severity of the following symptoms in the last week: Arm, Shoulder, or hand pain. 3 = Moderate  Rate the severity of the following symptoms in the last week: Tingling (pins and needles) in your arm, shoulder or hand. 3 = Moderate  During the past week, how much difficulty have you had sleeping because of the pain in your arm, shoulder or hand?  3 = Moderate difficulty   (A QuickDASH score may not be calculated if there is greater than 1 missing item.)  Quick Dash Disability/Symptom Score: 27.3  Minimally Clinically Important Difference (MCID): 15-20 points  Flavio, F. et al. (2013). Minimally clinically important difference of the disabilities of the arm, shoulder, and hand outcome measures (DASH) and its shortened version (Quick DASH). Journal of Orthopaedic & Sports Physical Therapy, 44(1), 30-39)    COGNITION: Overall cognitive status: Within functional limits for tasks assessed     SENSATION: Not tested  POSTURE: Rounded shoulder, forward head  UPPER EXTREMITY ROM:   Active ROM Right eval Left eval  Shoulder flexion 170 140  Shoulder extension    Shoulder abduction 165 55  Shoulder adduction    Shoulder internal rotation  70  Shoulder external rotation  55  Elbow flexion    Elbow extension    Wrist flexion    Wrist extension    Wrist ulnar deviation    Wrist radial deviation    Wrist pronation    Wrist supination    (Blank rows = not tested)  UPPER EXTREMITY MMT:  MMT Right eval Left eval  Shoulder flexion  4+  Shoulder extension    Shoulder abduction  4  Shoulder adduction    Shoulder internal rotation  4-  Shoulder external rotation  4-  Middle trapezius    Lower trapezius    Elbow flexion    Elbow extension    Wrist flexion    Wrist extension    Wrist ulnar deviation    Wrist radial deviation    Wrist pronation    Wrist supination    Grip strength (lbs)    (Blank rows = not tested)    JOINT MOBILITY TESTING:  WFL with PA mobilization  TREATMENT DATE:  [facility] Adult PT Treatment:                                                DATE: 07/26/24 Therapeutic Exercise: UBE level 3, 3'/3' Seated ITYs 3# x10 ea Neuromuscular re-ed: Rows GTB 2x15 Shoulder ext GTB 2x15 Seated BIL ER GTB 2x10 Seated horizontal abduction GTB 2x10 Shoulder flexion YTB 2 x 10  Shoulder D1 Extension GTB 2 x 10 both handles Overhead press 2x10 3# Chest press 2x10 5# Therapeutic Exercise: *** Manual Therapy: *** Neuromuscular re-ed: *** Therapeutic Activity: *** Modalities: *** Self Care: ***  [name] Adult PT Treatment:                                                DATE: 07/24/24 Therapeutic Exercise: UBE level 3, 3'/3' Seated ITYs 3#  x10 ea Neuromuscular re-ed: Rows GTB 2x15 Shoulder ext GTB 2x15 Seated BIL ER GTB 2x10 Seated horizontal abduction GTB 2x10 Shoulder flexion YTB 2 x 10  Shoulder D1 Extension GTB 2 x 10 both handles Overhead press 2x10 3# Chest press 2x10 5#  [facility] Adult PT Treatment:                                                DATE: 07/18/2024   Therapeutic Exercise: UBE level 3, 3'/3' (decreased level to 1.5 for last 2 minutes backwards) Seated ITYs 3# x10 ea   Neuromuscular re-ed: Rows GTB 2x15 Shoulder ext GTB 2x15 Seated BIL ER GTB 2x10 Seated horizontal abduction GTB 2x10 Shoulder flexion YTB 2 x 10  Shoulder D1 Extension RTB 2 x 10 both handles Consider overhead press, chest press at next visit   [facility] Adult PT Treatment:                                                DATE: 07/16/2024  Therapeutic Exercise: UBE level 3, 3'/3' (decreased level to 1.5 for last 2 minutes backwards) Pulleys flex/scaption x1' ea Seated ITYs 2# x10 ea  Neuromuscular re-ed: Rows GTB 2x10 Shoulder ext GTB 2x10 Seated BIL ER GTB 2x10 Seated horizontal abduction GTB 2x10 Shoulder flexion YTB 2 x 10  Shoulder D1 Extension YTB 2 x 10   [facility] Adult PT Treatment:                                                DATE: 07/11/2024  Therapeutic Exercise: UBE level 3,  3'/3' (decreased level to 1.5 for last 2 minutes backwards) Pulleys flex/scaption x1' ea Seated ITYs 1# x10 ea  Neuromuscular re-ed: Rows GTB 2x10 Shoulder ext GTB 2x10 Seated BIL ER GTB 2x10 Seated horizontal abduction GTB 2x10  [facility] Adult PT Treatment:  DATE: 07/09/2024  Therapeutic Exercise: UBE level 1 3'/3' while gathering subjective and planning session with patient Pulleys flex/scaption x2' ea Seated ITYs 1# x10 ea  Neuromuscular re-ed: Rows RTB 2x10 Shoulder ext RTB 2x10 Seated BIL ER RTB 2x10 Seated horizontal abduction RTB 2x10      PATIENT EDUCATION: Education details: reviewed  initial home exercise program; discussion of POC, prognosis and goals for skilled PT   Person educated: Patient Education method: Explanation, Demonstration, and Handouts Education comprehension: verbalized understanding, returned demonstration, and needs further education  HOME EXERCISE PROGRAM: Access Code: [email or web address] URL: [email or web address] Date: 07/18/2024 Prepared by: Marko Molt  Exercises - Standing Shoulder Horizontal Abduction with Resistance  - 1 x daily - 3-4 x weekly - 2 sets - 10-15 reps - SEATED Shoulder External Rotation and Scapular Retraction with Resistance  - 1 x daily - 3-4 x weekly - 2 sets - 10-15 reps - Standing Shoulder Row with Anchored Resistance  - 1 x daily - 3-4 x weekly - 2 sets - 10-15 reps - Shoulder extension with resistance - Neutral  - 1 x daily - 3-4 x weekly - 2 sets - 10-15 reps - Standing Shoulder Single Arm PNF D2 Extension with Anchored Resistance  - 1 x daily - 3-4 x weekly - 2 sets - 10-15 reps  ASSESSMENT:  CLINICAL IMPRESSION: 07/25/2024  PT was completed PT for shoulder RC and periscapular strengthening c min progression in demand c chest and OH presses completed today. Pt tolerated PT today without increase in L shoulder pain.    EVAL: Kristin [name] is a 69 y.o. female who was seen today for physical therapy evaluation and treatment for L shoulder pain with mobility deficits. She is demonstrating decreased L shoulder AROM, diminished L UE MMT, and decreased postural endurance. She has related pain and difficulty with overhead reaching, overhead lifting, heavy lifting, participation in regular exercise program, and performance of ADLs/IADLs. She requires skilled PT services at this time to address relevant deficits and improve overall function.     OBJECTIVE IMPAIRMENTS: decreased activity tolerance, decreased ROM, decreased strength, impaired UE functional use, postural dysfunction, and pain.   ACTIVITY LIMITATIONS: carrying,  lifting, and reach over head  PARTICIPATION LIMITATIONS: meal prep, cleaning, laundry, driving, and community activity  PERSONAL FACTORS: Age and 3+ comorbidities: hx of OA, seizures/epilepsy, obesity, type 2 DM, HTN, anemia, depression, RA are also affecting patient's functional outcome.   REHAB POTENTIAL: Fair    CLINICAL DECISION MAKING: Evolving/moderate complexity  EVALUATION COMPLEXITY: Moderate   GOALS: Goals reviewed with patient? YES  SHORT TERM GOALS: Target date: 07/18/2024   Patient will be independent with initial home program at least 3 days/week.  Baseline: provided at eval Goal Status: ONGOING  2.  Patient will demonstrate improved postural awareness for at least 15 minutes while seated without need for cueing from PT.  Baseline: see objective measures Goal Status: INITIAL   3.  Patient will demonstrate improved L shoulder AROM by at least 10-15 degrees flexion  Baseline: see objective measures Goal Status: INITIAL     LONG TERM GOALS: Target date: 08/15/2024    Patient will report improved overall functional ability with quickDASH score of 27.3.  Baseline: 10 or less Goal Status: INITIAL   2.  Patient will demonstrate at least 120 degrees of L shoulder abduction AROM.  Baseline:  Goal status: MET  3.  Patient will demonstrate at least 4+/5 MMT L shoulder  Baseline: see objective measures  Goal status: INITIAL  4.  Patient will report at least 85-90% confidence with return to independent strength training program for UQ.   Goal Status: INITIAL      PLAN:  PT FREQUENCY: 1-2x/week  PT DURATION: 8 weeks  PLANNED INTERVENTIONS: 02835- PT Re-evaluation, 97750- Physical Performance Testing, 97110-Therapeutic exercises, 97530- Therapeutic activity, [id number]- Neuromuscular re-education, 97535- Self Care, 02859- Manual therapy, G0283- Electrical stimulation (unattended), [phone]- Electrical stimulation (manual), 20560 (1-2 muscles), 20561 (3+ muscles)- Dry  Needling, Patient/Family education, Cryotherapy, and Moist heat  PLAN FOR NEXT SESSION: address L shoulder ROM, isometric strengthening, periscapular strengthening, RTC strengthening; otherwise address pain modulation via UBE, manual therapy and modalities as indicated   [employer] MS, PT 07/25/24 7:59 AM    "

## 2024-07-26 ENCOUNTER — Ambulatory Visit

## 2024-07-26 ENCOUNTER — Telehealth: Payer: Self-pay

## 2024-07-26 NOTE — Telephone Encounter (Signed)
 Called pt re: missed appt. Pt states she had car trouble and called the office to make us  aware. She states she did not get hold of anyone and left a message. Pt was advised of her next appt.

## 2024-07-30 ENCOUNTER — Ambulatory Visit: Admitting: Physical Therapy

## 2024-08-01 ENCOUNTER — Ambulatory Visit: Payer: Self-pay

## 2024-08-02 ENCOUNTER — Ambulatory Visit

## 2024-08-06 ENCOUNTER — Ambulatory Visit

## 2024-08-06 DIAGNOSIS — M25552 Pain in left hip: Secondary | ICD-10-CM

## 2024-08-06 DIAGNOSIS — Z96649 Presence of unspecified artificial hip joint: Secondary | ICD-10-CM

## 2024-08-06 DIAGNOSIS — M25512 Pain in left shoulder: Secondary | ICD-10-CM

## 2024-08-06 DIAGNOSIS — M6281 Muscle weakness (generalized): Secondary | ICD-10-CM

## 2024-08-08 ENCOUNTER — Ambulatory Visit

## 2024-08-08 VITALS — BP 129/69 | HR 99 | Ht 62.0 in | Wt 224.2 lb

## 2024-08-08 DIAGNOSIS — Z96642 Presence of left artificial hip joint: Secondary | ICD-10-CM

## 2024-08-08 DIAGNOSIS — K2100 Gastro-esophageal reflux disease with esophagitis, without bleeding: Secondary | ICD-10-CM

## 2024-08-08 DIAGNOSIS — K219 Gastro-esophageal reflux disease without esophagitis: Secondary | ICD-10-CM

## 2024-08-08 DIAGNOSIS — Z800 Family history of malignant neoplasm of digestive organs: Secondary | ICD-10-CM

## 2024-08-08 DIAGNOSIS — F3342 Major depressive disorder, recurrent, in full remission: Secondary | ICD-10-CM | POA: Insufficient documentation

## 2024-08-08 MED ORDER — PANTOPRAZOLE SODIUM 20 MG PO TBEC: 20.0000 mg | DELAYED_RELEASE_TABLET | Freq: Every day | ORAL | 0 refills | Status: AC

## 2024-08-08 MED ORDER — ESCITALOPRAM OXALATE 10 MG PO TABS: 10.0000 mg | ORAL_TABLET | Freq: Every day | ORAL | 0 refills | Status: AC

## 2024-08-08 MED ORDER — FAMOTIDINE 40 MG PO TABS: 40.0000 mg | ORAL_TABLET | Freq: Every day | ORAL | 1 refills | Status: AC

## 2024-08-08 MED ORDER — AMITRIPTYLINE HCL 75 MG PO TABS: 75.0000 mg | ORAL_TABLET | Freq: Every day | ORAL | 0 refills | Status: AC

## 2024-08-08 NOTE — Patient Instructions (Signed)
 Thank you for visiting the clinic today, it was good to see you!  Please always bring your medication bottles  In today's visit we discussed:  Hip: Continue doing the dressing changes and taking the doxycycline , is very important to go to your scheduled follow-up with infectious disease on the 19th for continued monitoring.  As I mentioned if you notice the wound getting red and swollen, hurting more to touch, having fevers with no other flulike symptoms, or the drainage changing in looking more thick and full of pus then go to the ED.  Depression: We have halved the dose of your amitriptyline , now takes 75 mg at night.  I have also added escitalopram  for you to be taking 10 mg once daily.  You can continue to use the trazodone  as needed but I strongly encouraged her to study the sleep hygiene information provided below and to try to make these changes in your sleep habits as you are able.  Reflux: I have sent in a prescription for 40 mg of Pepcid  or famotidine  to be taken once a day, while you are taking this medication we have also reduced your Protonix  or pantoprazole  medication to 20 mg daily.  Continue with the 20 mg for at least 3 to 4 weeks, can then consider going to every other day before discontinuing entirely.  As always strive to avoid eating greasy foods  Please follow-up in 2 weeks  For any questions, please call the office at [phone] or send me a message in MyChart. Have a great day!  -Fairy Amy, MD  [facility] Family Medicine Resident, PGY-1   DO'S and DON'TS for Poor Sleepers:    For people whose only medical complaint is, I can't sleep well, or I can't get to sleep easily, taking sleeping preparations may do more harm than good. Most authorities on sleep recommend against the use of sedative drugs by these people for the following reasons:     >> Sedatives modify nervous system activity during sleep; for example, they may reduce the normal periods of dreaming.  After taking sedatives for a while and then stopping, many people report they have sleep-disrupting dreams, which cause them to wake up feeling tired even after a full night's sleep.   >> The human body develops tolerance to sedatives after their repeated use. After a while, you have to take more and more sedative to make you feel sleepy.   >> A person can become psychologically dependent on sleeping preparations; ifyou are convinced that's the only way you can get a good night's sleep, you won't be able to go to sleep without a drug.     Non-drug aids to sleep  Before asking your pharmacist or your doctor for something to help you sleep, give the following suggestions a fair try:   1. Go to bed and rise at about the same times every day. Establishing a schedule helps regulate your body's inner clock. Also try to establish a sleep routine by following the same bedtime preparations each night, thereby telling yourself it's bedtime before you get into bed.   2. Make sure your sleeping conditions, including your bed, are as comfortable as possible. If you are sharing your bed with a snoring, cover-stealing, or restless partner, make separate, temporary sleeping arrangements until you reestablish a satisfactory sleeping pattern.   3. Wear loose-fitting nightclothes. The more comfortable you are, the better you will sleep.   4. Keep your bedroom darkened. If street lights shine in your  room or if you must sleep during the day, buy room-darkening shades or blinds.   5. Keep your bedroom as quiet as possible. If you can't block outside noise, cover it with a familiar inside noise such as the steady hum of a fan or other soothing noise.   6. Avoid taking an alcoholic drink-including beer or wine-before bedtime. When alcohol wears off during the night, you may experience periods of wakefulness.   7. Avoid too much mental stimulation during the hour or so prior to bedtime. Read a light novel or watch a relaxing TV  program; don't finish office work or discuss family finances with your spouse, for example. Consider learning relaxation methods or meditation to help relax prior to bedtime.  8. Avoid using your bedroom for working or watching television. Learn to associate that room with sleep.   9. If you can't sleep, get up and pursue some relaxing activity, such as reading or knitting, until you feel sleepy; do not lie in bed worrying about getting to sleep.   10. Avoid daytime napping, which tends to fragment sleep at night.   11. Avoid all caffeine-containing beverages after 2pm. Remember that many soft drinks, as well as coffee and tea, contain caffeine.   12. Try to get some exercise each day. Regular walks, bicycle rides, or whatever exercise you enjoy may help you sleep better. However, avoid vigorous exercise later than 3 hours before bedtime.  13. Maximize bright light exposure in the early mornings (natural sunlight is best, light boxes with 10,000 Lux intensity can also help), but avoid bright lights in the evening (including computer screens which are usually fairly bright and close to the eyes when used.)  COGNITIVE BEHAVIORAL THERAPY FOR INSOMNIA    COGNITIVE THERAPY  This form of therapy is aimed identifying distorted thinking or attitudes that are making you feel anxious or stressed, and then replacing these thoughts with more realistic or rational ones. As an example, many patients with insomnia have the sense that 'everyone needs 8 hours of sleep to feel rested' and will increase their time in bed to 12 or more hours in order to try to obtain 8 hours of sleep. In reality, most people can get by with 6 1/2 hours of sleep per night with minimal consequences.    RELAXATION TECHNIQUES  Relaxation techniques include deep-breathing exercises, meditation, light stretching, and listening to relaxing music or other sounds. Used before bedtime, these can help empty your mind of stressful thoughts, relax your  muscles and ease the day's tension.     STIMULUS CONTROL  Stimulus control is an attempt to eliminate associations that trigger or stimulate you to stay awake. The intent is to form a psychological connection between the bedroom and sleeping. Recommendations include:  - Using the bedroom only for sleep and sex. The bedroom should not be used for watching TV, reading, eating, or working, since each of these activities stimulates wakefulness.   - Mix up your evening routine to help prevent 'anticipatory insomnia'. (Don't brush your teeth just before bed, if doing so gets you started thinking about having difficulty initiating sleep that night, etc.)  - Go to bed only when you are sleepy; if your brain is not ready for sleep, you will only lay in bed awake.   - If you are unable to fall asleep in 15 or 20 minutes, get out of bed and go into another room and do relaxing activities until you become sleepy. (Avoid bright lighting or  other factors that stimulate wakefulness.)   - Make sure the bedroom is conducive to sleep (comfortable temperature, quiet, dark, etc.)  - Practice good sleep hygiene -- the habits that help ensure good sleep.     SLEEP RESTRICTION THERAPY  At first, this approach might sound counter-productive, however this can be one of the most powerful methods of treating insomnia. The purpose of sleep restriction therapy is to increase your need for sleep by temporarily shortening the amount of time in bed each night. I generally recommend starting with a total bed time of 5 1/2 to 6 hours. This creates a 'sleep debt' that will help your brain become a more efficient sleeper. Once you are sleeping better, you can gradually lengthen the amount of time in bed until you are getting an adequate amount of sleep. It is often helpful to move the bedtime later, particularly if you are having problems initiating sleep. Sleep restriction therapy works best if the wake-up time is constant (weekday and  weekend) and no napping is allowed.

## 2024-08-08 NOTE — Assessment & Plan Note (Signed)
 Okay control, though symptoms worse with fatty foods, no other identified food triggers.  No nausea or vomiting, has been taking pantoprazole  40 mg daily for at least 10 years. Reduce pantoprazole  from 40 mg to 20 mg daily, continue for 2 weeks at 20 mg daily and will reevaluate for further titration at follow-up visit. Start Pepcid  40 mg daily

## 2024-08-08 NOTE — Assessment & Plan Note (Signed)
 Started with TAH in 2017 subsequently followed by multiple I&D's and wound debridements culminating with total hip replacement and antibiotic spacer in 12/2019 overall stable, though with increase of thin yellow drainage after additional formation of small boil.  Mild increase in discomfort of hip, though no red flag symptoms of fever, erythema, or tenderness.  Gave strict ED return precautions given risk of cellulitis and systemic infection.  Infectious disease and Ortho currently following Follow-up with infectious disease as scheduled 2/19 Follow-up with Ortho as scheduled 4/14 Continue doxycycline  Continue wound dressing changes as previously instructed

## 2024-08-08 NOTE — Assessment & Plan Note (Signed)
 Currently stable, was initially diagnosed in 2011 as recurrent major depression, difficult to find further records as patient has some outside records.  Was initially started on amitriptyline  for dual headache and depression treatment in 2015.  Was previously following with psychology/therapy which was then transition to televisits and has not had a visit in over a year.  PHQ-9 today was 5, much improved from 3 months prior which was still mild to moderate range. Referral for therapy placed through [facility] Decrease amitriptyline  from 150 mg nightly to 75 mg nightly Start Lexapro  10 mg daily Some question whether this is truly recurrent major depressive disorder, will plan for further history taking and evaluation at follow-up visit as I suspect this was an erroneously placed diagnosis from over 10 years ago, though PHQ scores do appear to be consistent with decreased mood, will closely monitor with treatment transition. 2-week follow-up

## 2024-08-08 NOTE — Progress Notes (Signed)
 "   SUBJECTIVE:   CHIEF COMPLAINT / HPI:   Left hip s/p hip replacement: Reports that the boil has returned and is draining yellowish liquid, feeling some more pain in the joint that's worse with walking. No issues with taking doxycycline . Is scheduled to f/u with ID 2/19 and then ortho in April. Denies fevers, chills, diarrhea. Reports that she does physical therapy twice a week and tries doing it at home. Otherwise not very active, but [place] a [place].  Depression: Reports that she is still taking the amitriptyline  nightly, and follows with tele psychiatry last seen one year ago. Reports her depression feels like its at a good level, but has difficulty falling asleep. Will sometimes lie in bed awake for 1-2 hours 1-2x/week.  GERD: Reports her reflux is still poorly controlled. Endorses belching with radiation of tightness. Most commonly triggered by greasy foods like fried chicken. Has been on protonix  for several years.  No nausea or vomiting.  PERTINENT  PMH / PSH: HTN, T2DM, thyroid  nodule found to be benign on FNA, history of seizures, former smoker, dyslipidemia, osteoarthritis s/p left hip THA s/p joint replacement 12/2021 complicated by MRSA infection and chronic poorly healing wound followed by ID  OBJECTIVE:   BP (!) 127/54   Pulse (!) 103   Ht 5' 2 (1.575 m)   Wt 224 lb 3.2 oz (101.7 kg)   SpO2 100%   BMI 41.01 kg/m    Cardiac: Borderline tachycardic, regular rhythm. Normal S1/S2. No murmurs, rubs, or gallops appreciated. Lungs: Clear bilaterally to ascultation.  Abdomen: Normoactive bowel sounds. No tenderness to deep or light palpation. No rebound or guarding.   Psych: Pleasant and appropriate  Left hip: Erosion approximately 8 cm x 4 cm with hard surface exposed, minimal subcutaneous tissue, pink and well-perfused with light, thin yellow discharge, nonerythematous, nontender, see attached photo      08/08/2024    9:49 AM 05/27/2024   11:03 AM 05/17/2024   10:58 AM  05/06/2024    9:55 AM 08/18/2023    9:56 AM  Depression screen PHQ 2/9  Decreased Interest 1 3 3  0 1  Down, Depressed, Hopeless 0 2 1 0 2  PHQ - 2 Score 1 5 4  0 3  Altered sleeping 1 2 2 1 2   Tired, decreased energy 1 2 1  0 2  Change in appetite 1 1 1  0 1  Feeling bad or failure about yourself  0 0 0 0 1  Trouble concentrating 1 1 1  0 1  Moving slowly or fidgety/restless 0 1 1 0 1  Suicidal thoughts 0 0 0 0 0  PHQ-9 Score 5 12 10 1  11    Difficult doing work/chores Somewhat difficult Somewhat difficult Somewhat difficult Not difficult at all Not difficult at all     Data saved with a previous flowsheet row definition     ASSESSMENT/PLAN:   Assessment & Plan Recurrent major depressive disorder, in full remission Currently stable, was initially diagnosed in 2011 as recurrent major depression, difficult to find further records as patient has some outside records.  Was initially started on amitriptyline  for dual headache and depression treatment in 2015.  Was previously following with psychology/therapy which was then transition to televisits and has not had a visit in over a year.  PHQ-9 today was 5, much improved from 3 months prior which was still mild to moderate range. Referral for therapy placed through [facility] Decrease amitriptyline  from 150 mg nightly to 75 mg nightly Start  Lexapro  10 mg daily Some question whether this is truly recurrent major depressive disorder, will plan for further history taking and evaluation at follow-up visit as I suspect this was an erroneously placed diagnosis from over 10 years ago, though PHQ scores do appear to be consistent with decreased mood, will closely monitor with treatment transition. 2-week follow-up Status post left hip replacement Started with TAH in 2017 subsequently followed by multiple I&D's and wound debridements culminating with total hip replacement and antibiotic spacer in 12/2019 overall stable, though with increase of thin yellow  drainage after additional formation of small boil.  Mild increase in discomfort of hip, though no red flag symptoms of fever, erythema, or tenderness.  Gave strict ED return precautions given risk of cellulitis and systemic infection.  Infectious disease and Ortho currently following Follow-up with infectious disease as scheduled 2/19 Follow-up with Ortho as scheduled 4/14 Continue doxycycline  Continue wound dressing changes as previously instructed Gastroesophageal reflux disease, unspecified whether esophagitis present Okay control, though symptoms worse with fatty foods, no other identified food triggers.  No nausea or vomiting, has been taking pantoprazole  40 mg daily for at least 10 years. Reduce pantoprazole  from 40 mg to 20 mg daily, continue for 2 weeks at 20 mg daily and will reevaluate for further titration at follow-up visit. Start Pepcid  40 mg daily Family history of colon cancer Placed referral for repeat colonoscopy, she will be due starting tomorrow.     Fairy Amy, MD [facility] "

## 2024-08-09 ENCOUNTER — Ambulatory Visit

## 2024-08-09 ENCOUNTER — Telehealth: Payer: Self-pay | Admitting: *Deleted

## 2024-08-09 DIAGNOSIS — M25512 Pain in left shoulder: Secondary | ICD-10-CM

## 2024-08-09 NOTE — Progress Notes (Signed)
 Complex Care Management Note  Care Guide Note 08/09/2024 Name: MALAN WERK MRN: [id number] DOB: [birth year]  Barnie LITTIE Blush is a 69 y.o. year old female who sees Lorrane Pac, MD for primary care. I reached out to Barnie LITTIE Blush by phone today to offer complex care management services.  Ms. Rabbani was given information about Complex Care Management services today including:   The Complex Care Management services include support from the care team which includes your Nurse Care Manager, Clinical Social Worker, or Pharmacist.  The Complex Care Management team is here to help remove barriers to the health concerns and goals most important to you. Complex Care Management services are voluntary, and the patient may decline or stop services at any time by request to their care team member.   Complex Care Management Consent Status: Patient agreed to services and verbal consent obtained.   Follow up plan:  Telephone appointment with complex care management team member scheduled for:  08/20/24  Encounter Outcome:  Patient Scheduled  Harlene Satterfield  [facility]  [facility], [facility] Guide  Direct Dial: [phone]  Fax [phone]

## 2024-08-09 NOTE — Therapy (Signed)
 " OUTPATIENT PHYSICAL THERAPY NOTE DISCHARGE SUMMARY   Patient Name: Kristin [name] MRN: [id number] DOB:17-Jun-1956, 69 y.o., female Today's Date: 08/09/2024  PHYSICAL THERAPY DISCHARGE SUMMARY  Visits from Start of Care: 9   Current functional level related to goals / functional outcomes: See objective findings/assessment  Remaining deficits: See objective findings/assessment    Education / Equipment: See today's treatment/assessment      Patient agrees to discharge. Patient goals were met. Patient is being discharged due to meeting the stated rehab goals. Recommending for patient to begin [facility] program with [employer].     END OF SESSION:   PT End of Session - 08/09/24 1133     Visit Number 9    Number of Visits 16    Date for Recertification  08/15/24    Authorization Type UHC MCR/MCD    Authorization Time Period VL- medical necessity    PT Start Time 1131    PT Stop Time 1151    PT Time Calculation (min) 20 min    Activity Tolerance Patient tolerated treatment well    Behavior During Therapy WFL for tasks assessed/performed            Past Medical History:  Diagnosis Date   Allergy    seasonal   Anemia    Anxiety    Arthritis    Back pain, chronic    Borderline diabetes    Bronchitis    Constipation    current issue   Cough    Depression    Epilepsy (HCC)    GERD (gastroesophageal reflux disease)    Hyperlipidemia    Hypertension    IBS (irritable bowel syndrome)    Joint pain    Obesity    Palpitations    Pneumonia 2016   Pre-diabetes    Renal sclerosis, unspecified    only has 1 kidney   Rheumatoid arthritis (HCC)    Seizures (HCC)    last >15 +  yrs ago- recorded 07-26-2021   Somatization disorder    Tobacco abuse    Past Surgical History:  Procedure Laterality Date   ABDOMINAL HYSTERECTOMY     ANTERIOR HIP REVISION Left 07/25/2016   Procedure: LEFT HIP IRRIGATION AND DEBRIDEMENT, REVISION OF HEAD AND LINER;  Surgeon:  Kay CHRISTELLA Cummins, MD;  Location: [facility] OR;  Service: Orthopedics;  Laterality: Left;   APPLICATION OF WOUND VAC Left 12/23/2019   Procedure: APPLICATION OF WOUND VAC;  Surgeon: Cummins Kay CHRISTELLA, MD;  Location: [facility] OR;  Service: Orthopedics;  Laterality: Left;   BREAST EXCISIONAL BIOPSY Right    CHOLECYSTECTOMY     COLONOSCOPY     DEBRIDEMENT AND CLOSURE WOUND Left 07/17/2019   Procedure: Excision of left leg wound with ACell placement and primary closure;  Surgeon: Lowery Estefana RAMAN, DO;  Location: [facility];  Service: Plastics;  Laterality: Left;  60 min   EXCISIONAL TOTAL HIP ARTHROPLASTY WITH ANTIBIOTIC SPACERS Left 12/23/2019   Procedure: LEFT HIP IRRIGATION AND DEBRIDEMENT LEFT HIP WOUND and poly exchange ;  Surgeon: Cummins Kay CHRISTELLA, MD;  Location: [facility] OR;  Service: Orthopedics;  Laterality: Left;   HIP DEBRIDEMENT Left 01/19/2018   Procedure(s) Performed: IRRIGATION AND DEBRIDEMENT LEFT HIP (Left )   INCISION AND DRAINAGE HIP Left 08/03/2016   Procedure: IRRIGATION AND DEBRIDEMENT LEFT HIP, VAC PLACEMENT;  Surgeon: Kay CHRISTELLA Cummins, MD;  Location: [facility] OR;  Service: Orthopedics;  Laterality: Left;   INCISION AND DRAINAGE HIP Left 01/19/2018   Procedure:  IRRIGATION AND DEBRIDEMENT LEFT HIP;  Surgeon: Jerri Kay HERO, MD;  Location: [facility] OR;  Service: Orthopedics;  Laterality: Left;   kidney stent Right 2002   TONSILLECTOMY Bilateral 12/06/2014   Procedure: TONSILLECTOMY, BILATERAL;  Surgeon: Merilee Kraft, MD;  Location: [facility];  Service: ENT;  Laterality: Bilateral;   TONSILLECTOMY     TOTAL HIP ARTHROPLASTY Left 06/01/2016   Procedure: LEFT TOTAL HIP ARTHROPLASTY ANTERIOR APPROACH;  Surgeon: Kay HERO Jerri, MD;  Location: [facility] OR;  Service: Orthopedics;  Laterality: Left;   TUBAL LIGATION     WOUND EXPLORATION Left 01/19/2018   Procedure: WOUND EXPLORATION;  Surgeon: Jerri Kay HERO, MD;  Location: [facility] OR;  Service: Orthopedics;  Laterality: Left;   Patient Active Problem List   Diagnosis Date Noted    Recurrent major depressive disorder, in full remission 08/08/2024   Presence of left artificial hip joint 12/10/2021   Type 2 diabetes mellitus with obesity 12/09/2021   History of seizure 11/26/2021   Body mass index 40.0-44.9, adult (HCC) 02/15/2018   Prosthetic joint infection of left hip 08/24/2016   Status post left hip replacement 06/01/2016   Sinus tachycardia 05/19/2016   Thyroid  nodule 12/25/2014   Infiltrate noted on imaging study    UNSPECIFIED RENAL SCLEROSIS 01/16/2009   GERD, SEVERE 05/25/2007   Morbid obesity (HCC) 04/03/2007   Dyslipidemia 08/31/2006   Former smoker 08/31/2006   HTN (hypertension) 08/31/2006    PCP: Lorrane Pac, MD   REFERRING PROVIDER:  Donah Laymon PARAS, MD    REFERRING DIAG:  [phone] (ICD-10-CM) - Subacromial impingement of left shoulder  Rotator cuff strengthening     THERAPY DIAG:  Left shoulder pain, unspecified chronicity  Rationale for Evaluation and Treatment: Rehabilitation  ONSET DATE: 05/04/2024  SUBJECTIVE:                                                                                                                                                                                      SUBJECTIVE STATEMENT: Patient reports that she has noticed improved mobility since starting PT. She has no shoulder pain and feels ready for discharge. She does continue to feel less confident with resuming dumbbell strengthening exercises. She plans to try going to the [facility].  EVAL: She confirms that her Left shoulder began hurting after beginning a weight training program. She did have a cortisone shot from Dr. Jerri, 2 weeks ago, which has been helpful. She states that she was told to take a break from exercising. She would like to return to an exercise routine for her weight loss goals.  Hand dominance: Right  PERTINENT HISTORY: hx of OA, seizures/epilepsy, obesity, type 2 DM, HTN,  anemia, depression, RA  PAIN:  Are you having pain?   Yes: NPRS scale: 0/10 current, 10/10 worst Pain location: L shoulder and L UE Pain description: shooting, tingling Aggravating factors: OH lifting, lifting something heavy  Relieving factors: cortisone injection, heat pad,   PRECAUTIONS: None  RED FLAGS: None   WEIGHT BEARING RESTRICTIONS: No  FALLS:  Has patient fallen in last 6 months? No  LIVING ENVIRONMENT: Lives with: lives with their spouse Lives in: House/apartment Stairs: No Has following equipment at home: None  OCCUPATION: N/a  PLOF: Independent with household mobility with device, Independent with community mobility with device, and Leisure: crafting  PATIENT GOALS:able to move my arm without pain, learning better way to exercise   NEXT MD VISIT:   OBJECTIVE:  Note: Objective measures were completed at Evaluation unless otherwise noted.  DIAGNOSTIC FINDINGS:  06/06/24: L Shoulder X-Ray   X-rays of the left shoulder show degenerative spurring of the acromion and  AC joint.  Slight arthritic changes of the glenohumeral joint.  No acute  abnormalities.   PATIENT SURVEYS:  Quick Dash:  QUICK DASH  Please rate your ability do the following activities in the last week by selecting the number below the appropriate response.   Activities Rating 08/09/2024   Open a tight or new jar.  3 = Moderate difficulty 3  Do heavy household chores (e.g., wash walls, floors). 5 = Unable 3  Carry a shopping bag or briefcase 2 = Mild difficulty 1  Wash your back. 5 = Unable 3  Use a knife to cut food. 2 = Mild difficulty 1  Recreational activities in which you take some force or impact through your arm, shoulder or hand (e.g., golf, hammering, tennis, etc.). 3 = Moderate difficulty 2  During the past week, to what extent has your arm, shoulder or hand problem interfered with your normal social activities with family, friends, neighbors or groups?  3 = Moderately 2  During the past week, were you limited in your work or  other regular daily activities as a result of your arm, shoulder or hand problem? 2 = Slightly limited 1  Rate the severity of the following symptoms in the last week: Arm, Shoulder, or hand pain. 3 = Moderate 2  Rate the severity of the following symptoms in the last week: Tingling (pins and needles) in your arm, shoulder or hand. 3 = Moderate 1  During the past week, how much difficulty have you had sleeping because of the pain in your arm, shoulder or hand?  3 = Moderate difficulty 1   (A QuickDASH score may not be calculated if there is greater than 1 missing item.)  Quick Dash Disability/Symptom Score: 27.3  08/09/2024 20.45  Minimally Clinically Important Difference (MCID): 15-20 points  Flavio, F. et al. (2013). Minimally clinically important difference of the disabilities of the arm, shoulder, and hand outcome measures (DASH) and its shortened version (Quick DASH). Journal of Orthopaedic & Sports Physical Therapy, 44(1), 30-39)   COGNITION: Overall cognitive status: Within functional limits for tasks assessed     SENSATION: Not tested  POSTURE: Rounded shoulder, forward head  UPPER EXTREMITY ROM:   Active ROM Right eval Left eval Left 08/09/2024   Shoulder flexion 170 140 165  Shoulder extension     Shoulder abduction 165 55 170  Shoulder adduction     Shoulder internal rotation  70 85  Shoulder external rotation  55 80  Elbow flexion     Elbow extension  Wrist flexion     Wrist extension     Wrist ulnar deviation     Wrist radial deviation     Wrist pronation     Wrist supination     (Blank rows = not tested)  UPPER EXTREMITY MMT:  MMT Right eval Left eval Left 08/09/2024   Shoulder flexion  4+ 4+  Shoulder extension     Shoulder abduction  4 4+  Shoulder adduction     Shoulder internal rotation  4- 4+  Shoulder external rotation  4- 4+  Middle trapezius     Lower trapezius     Elbow flexion     Elbow extension     Wrist flexion     Wrist  extension     Wrist ulnar deviation     Wrist radial deviation     Wrist pronation     Wrist supination     Grip strength (lbs)     (Blank rows = not tested)    JOINT MOBILITY TESTING:  WFL with PA mobilization                                                                                                                              TREATMENT DATE:    [facility] Adult PT Treatment:                                                DATE: 08/09/2024  Therapeutic Activity:  Reassessment of objective measures and subjective assessment regarding progress towards established goals and plan for independence with prescribed home program following discharged from PT      PATIENT EDUCATION: Education details: reviewed initial home exercise program; discussion of POC, prognosis and goals for skilled PT   Person educated: Patient Education method: Explanation, Demonstration, and Handouts Education comprehension: verbalized understanding, returned demonstration, and needs further education  HOME EXERCISE PROGRAM: Access Code: [email or web address] URL: [email or web address] Date: 07/18/2024 Prepared by: Marko Molt  Exercises - Standing Shoulder Horizontal Abduction with Resistance  - 1 x daily - 3-4 x weekly - 2 sets - 10-15 reps - SEATED Shoulder External Rotation and Scapular Retraction with Resistance  - 1 x daily - 3-4 x weekly - 2 sets - 10-15 reps - Standing Shoulder Row with Anchored Resistance  - 1 x daily - 3-4 x weekly - 2 sets - 10-15 reps - Shoulder extension with resistance - Neutral  - 1 x daily - 3-4 x weekly - 2 sets - 10-15 reps - Standing Shoulder Single Arm PNF D2 Extension with Anchored Resistance  - 1 x daily - 3-4 x weekly - 2 sets - 10-15 reps  ASSESSMENT:  CLINICAL IMPRESSION: 08/09/2024 Sheri has attended 9 visits since initial evaluation. Patient has made good progress with L shoulder AROM and pain severity, and have met all  goals. She continues to have  decreased confidence level with returning to strength training program that includes dumbbells, and should continue with prescribed home program. Patient encouraged to begin [facility] wellness program. Patient will be discharged from skilled PT at this time and should follow up with referring provider as needed.    EVAL: Soleia is a 69 y.o. female who was seen today for physical therapy evaluation and treatment for L shoulder pain with mobility deficits. She is demonstrating decreased L shoulder AROM, diminished L UE MMT, and decreased postural endurance. She has related pain and difficulty with overhead reaching, overhead lifting, heavy lifting, participation in regular exercise program, and performance of ADLs/IADLs. She requires skilled PT services at this time to address relevant deficits and improve overall function.     OBJECTIVE IMPAIRMENTS: decreased activity tolerance, decreased ROM, decreased strength, impaired UE functional use, postural dysfunction, and pain.   ACTIVITY LIMITATIONS: carrying, lifting, and reach over head  PARTICIPATION LIMITATIONS: meal prep, cleaning, laundry, driving, and community activity  PERSONAL FACTORS: Age and 3+ comorbidities: hx of OA, seizures/epilepsy, obesity, type 2 DM, HTN, anemia, depression, RA are also affecting patient's functional outcome.   REHAB POTENTIAL: Fair    CLINICAL DECISION MAKING: Evolving/moderate complexity  EVALUATION COMPLEXITY: Moderate   GOALS: Goals reviewed with patient? YES  SHORT TERM GOALS: Target date: 07/18/2024   Patient will be independent with initial home program at least 3 days/week.  Baseline: provided at eval Goal Status: MET  2.  Patient will demonstrate improved postural awareness for at least 15 minutes while seated without need for cueing from PT.  Baseline: see objective measures Goal Status: MET  3.  Patient will demonstrate improved L shoulder AROM by at least 10-15 degrees flexion  Baseline: see  objective measures Goal Status: MET    LONG TERM GOALS: Target date: 08/15/2024   Patient will report improved overall functional ability with quickDASH score of 27.3.  Baseline: 10 or less Goal Status: MET  2.  Patient will demonstrate at least 120 degrees of L shoulder abduction AROM.  Baseline:  Goal status: MET  3.  Patient will demonstrate at least 4+/5 MMT L shoulder  Baseline: see objective measures  Goal status: MET  4.  Patient will report at least 85-90% confidence with return to independent strength training program for UQ.   Goal Status: MET     PLAN:  PT FREQUENCY: 1-2x/week  PT DURATION: 8 weeks  PLANNED INTERVENTIONS: 02835- PT Re-evaluation, 97750- Physical Performance Testing, 97110-Therapeutic exercises, 97530- Therapeutic activity, [id number]- Neuromuscular re-education, 97535- Self Care, 02859- Manual therapy, [id number]- Electrical stimulation (unattended), [phone]- Electrical stimulation (manual), 20560 (1-2 muscles), 20561 (3+ muscles)- Dry Needling, Patient/Family education, Cryotherapy, and Moist heat  PLAN FOR NEXT SESSION: discharged today; recommending participation in 12-week [facility] program with ALDON Marko Molt, PT, DPT  08/09/2024 3:55 PM      "

## 2024-08-20 ENCOUNTER — Telehealth

## 2024-08-22 ENCOUNTER — Ambulatory Visit

## 2024-08-22 ENCOUNTER — Ambulatory Visit: Payer: Self-pay

## 2024-08-22 ENCOUNTER — Ambulatory Visit: Admitting: Infectious Diseases

## 2024-09-06 ENCOUNTER — Ambulatory Visit: Payer: Self-pay
